# Patient Record
Sex: Male | Born: 1955 | Race: White | Hispanic: No | Smoking: Former smoker
Health system: Southern US, Community
[De-identification: ages and names within clinical notes are randomized; demographics above are authoritative.]

## PROBLEM LIST (undated history)

## (undated) DIAGNOSIS — A0472 Enterocolitis due to Clostridium difficile, not specified as recurrent: Secondary | ICD-10-CM

## (undated) DIAGNOSIS — M1990 Unspecified osteoarthritis, unspecified site: Secondary | ICD-10-CM

## (undated) DIAGNOSIS — R0602 Shortness of breath: Secondary | ICD-10-CM

## (undated) DIAGNOSIS — F1910 Other psychoactive substance abuse, uncomplicated: Secondary | ICD-10-CM

## (undated) DIAGNOSIS — C61 Malignant neoplasm of prostate: Secondary | ICD-10-CM

## (undated) DIAGNOSIS — G459 Transient cerebral ischemic attack, unspecified: Secondary | ICD-10-CM

## (undated) DIAGNOSIS — F419 Anxiety disorder, unspecified: Secondary | ICD-10-CM

## (undated) DIAGNOSIS — F329 Major depressive disorder, single episode, unspecified: Secondary | ICD-10-CM

## (undated) DIAGNOSIS — K219 Gastro-esophageal reflux disease without esophagitis: Secondary | ICD-10-CM

## (undated) DIAGNOSIS — F32A Depression, unspecified: Secondary | ICD-10-CM

## (undated) DIAGNOSIS — I10 Essential (primary) hypertension: Secondary | ICD-10-CM

## (undated) DIAGNOSIS — F1010 Alcohol abuse, uncomplicated: Secondary | ICD-10-CM

## (undated) DIAGNOSIS — I82409 Acute embolism and thrombosis of unspecified deep veins of unspecified lower extremity: Secondary | ICD-10-CM

## (undated) DIAGNOSIS — K567 Ileus, unspecified: Secondary | ICD-10-CM

## (undated) DIAGNOSIS — T7840XA Allergy, unspecified, initial encounter: Secondary | ICD-10-CM

## (undated) DIAGNOSIS — K7010 Alcoholic hepatitis without ascites: Secondary | ICD-10-CM

## (undated) HISTORY — PX: HAND SURGERY: SHX662

## (undated) HISTORY — DX: Acute embolism and thrombosis of unspecified deep veins of unspecified lower extremity: I82.409

## (undated) HISTORY — DX: Allergy, unspecified, initial encounter: T78.40XA

## (undated) HISTORY — DX: Other psychoactive substance abuse, uncomplicated: F19.10

## (undated) HISTORY — DX: Depression, unspecified: F32.A

## (undated) HISTORY — PX: HERNIA REPAIR: SHX51

## (undated) HISTORY — DX: Alcohol abuse, uncomplicated: F10.10

## (undated) HISTORY — DX: Major depressive disorder, single episode, unspecified: F32.9

---

## 2002-04-21 ENCOUNTER — Ambulatory Visit (HOSPITAL_BASED_OUTPATIENT_CLINIC_OR_DEPARTMENT_OTHER): Admission: RE | Admit: 2002-04-21 | Discharge: 2002-04-21 | Payer: Self-pay | Admitting: *Deleted

## 2002-04-21 ENCOUNTER — Encounter (INDEPENDENT_AMBULATORY_CARE_PROVIDER_SITE_OTHER): Payer: Self-pay | Admitting: *Deleted

## 2006-12-11 ENCOUNTER — Ambulatory Visit (HOSPITAL_BASED_OUTPATIENT_CLINIC_OR_DEPARTMENT_OTHER): Admission: RE | Admit: 2006-12-11 | Discharge: 2006-12-11 | Payer: Self-pay | Admitting: Orthopedic Surgery

## 2006-12-11 ENCOUNTER — Encounter (INDEPENDENT_AMBULATORY_CARE_PROVIDER_SITE_OTHER): Payer: Self-pay | Admitting: Orthopedic Surgery

## 2009-03-19 ENCOUNTER — Ambulatory Visit: Payer: Self-pay | Admitting: Gastroenterology

## 2009-03-29 ENCOUNTER — Ambulatory Visit: Payer: Self-pay | Admitting: Gastroenterology

## 2009-03-29 ENCOUNTER — Encounter: Payer: Self-pay | Admitting: Gastroenterology

## 2009-03-30 ENCOUNTER — Encounter: Payer: Self-pay | Admitting: Gastroenterology

## 2010-11-25 NOTE — Op Note (Signed)
NAMEDREVON, PLOG                            ACCOUNT NO.:  [id number]   MEDICAL RECORD NO.:  [id number]                   PATIENT TYPE:  AMB   LOCATION:  DSC                                  FACILITY:  [facility]   PHYSICIAN:  Maisie Fus B. Samuella Cota, M.D.               DATE OF BIRTH:  July 15, 1955   DATE OF PROCEDURE:  04/21/2002  DATE OF DISCHARGE:                                 OPERATIVE REPORT   CCS NUMBER:  [id number]   PREOPERATIVE DIAGNOSIS:  Left inguinal hernia.   POSTOPERATIVE DIAGNOSIS:  Left inguinal hernia.   PROCEDURE:  Left inguinal herniorrhaphy with mesh.   SURGEON:  Maisie Fus B. Samuella Cota, M.D.   ANESTHESIA:  Local (0.25% Marcaine without epinephrine, 1% Xylocaine without  epinephrine, and sodium bicarbonate), then general anesthesia by  anesthesiologist and CRNA.   DESCRIPTION OF PROCEDURE:  The patient was taken to the operating room and  placed on the table in the supine position.  The left lower quadrant of the  abdomen was prepped and draped in a sterile field.  An oblique incision was  outlined with the skin marker.  Local anesthetic was used to block the area  of the ilioinguinal nerve and the area for the incision.  The incision was  made through skin and subcutaneous tissue with subcutaneous bleeders being  clamped and ligated with 3-0 Vicryl or cauterized with the Bovie.  The  external oblique aponeurosis was divided in the line of its fibers through  and through the external ring.  About this time, the patient although he was  not having pain was having trouble lying still and was requiring large  amounts of medication, and it was elected to give him a general anesthetic  which was done.  I continued to use a local anesthetic to block the  structures beneath the external oblique aponeurosis and then used some local  later.  The cord structures were isolated with the Penrose drain.  A large  nerve was never seen.  After the cord structures had been isolated with the  Penrose drain an indirect hernia sac was found coming off the anterior  medial aspect of the cord structures.  It had a fairly wide base and was  dissected back to the internal ring.  The hernia sac was opened with the  excess hernia sac being removed, and then the peritoneum was closed with a  running suture of 0 chromic catgut with the two ends tied to each other.  Then this retracted beneath the transversalis muscle.  The patient really  had no weakness of the inguinal floor.  A piece of 3 inch x 6 inch atria  mesh was then fastened to cover the entire inguinal floor to extend around  the cord structures at the internal ring.  The mesh was attached inferiorly  with a running suture of 2-0 Novofil with the first suture being placed  in  the pubic tubercle and the other sutures in the shelving edge of Poupart's  ligament.  A slit was made in the mesh to accommodate the cord structures at  the internal ring.  The mesh was then anchored medially and superiorly with  interrupted sutures of 0 Novafil.  The mesh was then sutured to itself using  a 3-0 Vicryl suture.  There was sufficient for the cord structures at the  internal ring.  The cord structures were then returned to their normal  anatomical position, and the external oblique aponeurosis was reapproximated  with interrupted sutures of 3-0 Vicryl.  A Kelly clamp could easily be  inserted along the cord structures at the external ring.  Subcutaneous  tissue  was closed with 3-0 Vicryl and the skin was closed with a running  subcuticular suture of 4-0 Vicryl.  Benzoin and 1/2 inch Steri-Strips were  used to reinforce the skin closure.  Dry sterile dressing was applied.  The  patient seemed to tolerate the procedure well and was taken to the PACU in  satisfactory condition.                                               Thomas B. Samuella Cota, M.D.    TBP/MEDQ  D:  04/21/2002  T:  04/21/2002  Job:  [id number]   cc:   Erskine Speed, M.D.

## 2010-11-25 NOTE — Op Note (Signed)
Tanner [name], Tanner [name]                ACCOUNT NO.:  [id number]   MEDICAL RECORD NO.:  [id number]          PATIENT TYPE:  AMB   LOCATION:  DSC                          FACILITY:  [facility]   PHYSICIAN:  Tanner [name], M.D. DATE OF BIRTH:  10/29/1955   DATE OF PROCEDURE:  12/11/2006  DATE OF DISCHARGE:                               OPERATIVE REPORT   PREOPERATIVE DIAGNOSIS:  Severe Dupuytren contracture, right palm, ring  and small fingers.   POSTOPERATIVE DIAGNOSIS:  Severe Dupuytren contracture, right palm, ring  and small fingers.   OPERATION:  1. Resection of complex Dupuytren contracture from right palm and      small finger with resection of extensive nodular disease overlying      metacarpal phalangeal joints and proximal phalangeal segment of      small finger with the V-Y advancement flaps for coverage.  2. Resection of complex Dupuytren contracture, right ring finger and      palm with V-Y advancement flaps and opened palm technique.   OPERATIONS:  Tanner [name], M.D.   ASSISTANT:  Annye Rusk, PA-C   ANESTHESIA:  General by LMA.  Supervising anesthesiologist is Dr. Adonis Huguenin.   INDICATIONS:  Tanner [name] is a 56 year old man referred through  the courtesy of Dr. Elmore Guise for evaluation and management of a complex  Dupuytren contracture.   Tanner [name] has Celtic  ancestors and has a sister who has Dupuytren  contracture.  He has extensive nodular disease in the ring and small  finger rays with a progressive metacarpophalangeal flexion contracture  of the ring and small fingers and extensive PIP flexion contracture of  the small finger.   He presented for consultation and was advised to consider excision.  The  surgery, potential risks and benefits, aftercare and requirement for  postoperative therapy were explained in detail.   Questions were invited and answered.   He understands that surgery is palliative and will not cure his genetic  tendency  towards palmar fibromatosis.   He presents for surgery at this time anticipating resection of his  pathologic fascia.   Preoperatively, questions were invited and answered in the holding area  in detail.   PROCEDURE:  Tanner [name] was brought to the operating room and was  placed in the supine position on the operating table.   Following an anesthesia consult with Dr. Krista Blue, Tanner [name] declined a  regional block.   He was brought to Room 1 and placed in the supine position on the  operating table.  Under Dr. Robina Ade direct supervision, general  anesthesia by LMA induced.   The right arm was prepped with Betadine soap and solution and sterilely  draped.  A pneumatic tourniquet was applied to the proximal right  brachium.   Following examination of the right arm with an Esmarch bandage, an  arterial tourniquet on the proximal right brachium was inflated to 240  mmHg.  Procedure commenced with planning of Brunner zigzag incisions  anticipating extension to V-Y flaps over the flexion creases.  Given his  degree of nodular disease in the  palm and extensive dissection required,  open palm technique was anticipated.   Procedure commenced with sharp elevation of the skin with separation of  the pathologic fascia at the dermal layer.  After skin flaps were  elevated and retracted with 4-0 silk sutures, extensive dissection of  the nodular palmar fibromatosis was accomplished in the ring and small  fingers.   Extensive nodular disease was removed from the level the metacarpal  phalangeal joints in both the ring and small fingers.  Extensive nodular  disease was removed over the proximal phalangeal segment of the small  finger, ulnar aspect.  Fibers extending to the flexor digiti minimi and  abductor digiti minimi were resected and the natatory ligaments and  Grayson  ligaments were resected with the pathologic fascia.  The entire  palmar fascia to the ring and small fingers was  resected in the palm,  taking care to spare the common digital vessels and nerves.   After completion of the dissection, the skin flaps were lengthened by V-  Y advancement technique followed by release of the tourniquet.  There  was excellent capillary refill in all flaps.  The wound was then  repaired with corner sutures of 5-0 nylon and vertical and horizontal  mattress sutures of 5-0 nylon leaving the transverse segment of the  incision in the palm open.   The wound was then dressed with Adaptic, Silvadene, fluffed gauze,  Kerlix, sterile Webril and a volar plaster splint maintaining the wrist  in 15 degrees of extension.  There were no apparent complications.  Mr.  [name] had excellent capillary refill in his fingertips after release of  the tourniquet and viable skin margins.   For aftercare, he was provided prescriptions for Percocet 5 mg 1 p.o.  every 4 to 6 hours p.r.n. pain, 30 tablets without refill, Motrin 600 mg  1 p.o. every 6 hours with food, 30 tablets with 1 refill, and Keflex 500  mg 1 p.o. every 8 hours for 4 days as a prophylactic antibiotic.      Tanner [name], M.D.  Electronically Signed     RVS/MEDQ  D:  12/11/2006  T:  12/11/2006  Job:  [id number]   cc:   Erskine Speed, M.D.

## 2011-04-27 LAB — BASIC METABOLIC PANEL WITH GFR
BUN: 13
CO2: 32
Calcium: 10
Chloride: 99
Creatinine, Ser: 1.02
GFR calc non Af Amer: >60
Glucose, Bld: 112 — ABNORMAL HIGH
Potassium: 5.5 — ABNORMAL HIGH
Sodium: 138

## 2011-04-27 LAB — POCT HEMOGLOBIN-HEMACUE
Hemoglobin: 15.2
Operator id: 116011

## 2011-12-16 ENCOUNTER — Encounter (HOSPITAL_BASED_OUTPATIENT_CLINIC_OR_DEPARTMENT_OTHER): Payer: Self-pay | Admitting: *Deleted

## 2011-12-16 ENCOUNTER — Emergency Department (HOSPITAL_BASED_OUTPATIENT_CLINIC_OR_DEPARTMENT_OTHER)
Admission: EM | Admit: 2011-12-16 | Discharge: 2011-12-16 | Disposition: A | Discharge status: Home / Self Care | Payer: Self-pay | Attending: Emergency Medicine | Admitting: Emergency Medicine

## 2011-12-16 DIAGNOSIS — I10 Essential (primary) hypertension: Secondary | ICD-10-CM | POA: Insufficient documentation

## 2011-12-16 DIAGNOSIS — M79609 Pain in unspecified limb: Secondary | ICD-10-CM | POA: Insufficient documentation

## 2011-12-16 DIAGNOSIS — F17200 Nicotine dependence, unspecified, uncomplicated: Secondary | ICD-10-CM | POA: Insufficient documentation

## 2011-12-16 DIAGNOSIS — B353 Tinea pedis: Secondary | ICD-10-CM

## 2011-12-16 HISTORY — DX: Essential (primary) hypertension: I10

## 2011-12-16 HISTORY — DX: Alcoholic hepatitis without ascites: K70.10

## 2011-12-16 MED ORDER — SULFAMETHOXAZOLE-TRIMETHOPRIM 800-160 MG PO TABS: 1.0000 | ORAL_TABLET | Freq: Two times a day (BID) | ORAL | Status: AC

## 2011-12-16 MED ORDER — CLOTRIMAZOLE 1 % EX CREA: TOPICAL_CREAM | CUTANEOUS | Status: DC

## 2011-12-16 NOTE — ED Notes (Signed)
Pt states he was laid off in Jan. Started wearing tennis shoes all the time since. Noticed feet were breaking out in Feb. Worse now. Using creams and sprays since without relief. Saw dermatologist in Jan.

## 2011-12-16 NOTE — Discharge Instructions (Signed)
Athlete's Foot Athlete's foot (tinea pedis) is a fungal infection of the skin on the feet. It often occurs on the skin between the toes or underneath the toes. It can also occur on the soles of the feet. Athlete's foot is more likely to occur in hot, humid weather. Not washing your feet or changing your socks often enough can contribute to athlete's foot. The infection can spread from person to person (contagious). CAUSES Athlete's foot is caused by a fungus. This fungus thrives in warm, moist places. Most people get athlete's foot by sharing shower stalls, towels, and wet floors with an infected person. People with weakened immune systems, including those with diabetes, may be more likely to get athlete's foot. SYMPTOMS   Itchy areas between the toes or on the soles of the feet.   White, flaky, or scaly areas between the toes or on the soles of the feet.   Tiny, intensely itchy blisters between the toes or on the soles of the feet.   Tiny cuts on the skin. These cuts can develop a bacterial infection.   Thick or discolored toenails.  DIAGNOSIS  Your caregiver can usually tell what the problem is by doing a physical exam. Your caregiver may also take a skin sample from the rash area. The skin sample may be examined under a microscope, or it may be tested to see if fungus will grow in the sample. A sample may also be taken from your toenail for testing. TREATMENT  Over-the-counter and prescription medicines can be used to kill the fungus. These medicines are available as powders or creams. Your caregiver can suggest medicines for you. Fungal infections respond slowly to treatment. You may need to continue using your medicine for several weeks. PREVENTION   Do not share towels.   Wear sandals in wet areas, such as shared locker rooms and shared showers.   Keep your feet dry. Wear shoes that allow air to circulate. Wear cotton or wool socks.  HOME CARE INSTRUCTIONS   Take medicines as  directed by your caregiver. Do not use steroid creams on athlete's foot.   Keep your feet clean and cool. Wash your feet daily and dry them thoroughly, especially between your toes.   Change your socks every day. Wear cotton or wool socks. In hot climates, you may need to change your socks 2 to 3 times per day.   Wear sandals or canvas tennis shoes with good air circulation.   If you have blisters, soak your feet in Burow's solution or Epsom salts for 20 to 30 minutes, 2 times a day to dry out the blisters. Make sure you dry your feet thoroughly afterward.  SEEK MEDICAL CARE IF:   You have a fever.   You have swelling, soreness, warmth, or redness in your foot.   You are not getting better after 7 days of treatment.   You are not completely cured after 30 days.   You have any problems caused by your medicines.  MAKE SURE YOU:   Understand these instructions.   Will watch your condition.   Will get help right away if you are not doing well or get worse.  Document Released: 06/23/2000 Document Revised: 06/15/2011 Document Reviewed: 04/14/2011 [employer] Patient Information 2012 [employer]. 

## 2011-12-16 NOTE — ED Provider Notes (Signed)
History     CSN: [id number]  Arrival date & time 12/16/11  1357   First MD Initiated Contact with Patient 12/16/11 1447      Chief Complaint  Patient presents with  . Foot Pain    (Consider location/radiation/quality/duration/timing/severity/associated sxs/prior treatment) Patient is a 56 y.o. male presenting with lower extremity pain. The history is provided by the patient.  Foot Pain This is a new problem. The current episode started today. The problem occurs constantly. The problem has been gradually worsening. Associated symptoms include a rash. The symptoms are aggravated by nothing. He has tried nothing for the symptoms. The treatment provided moderate relief.  Pt complains of a rash to bilat feet.   Pt has a history of foot fungus  Past Medical History  Diagnosis Date  . Alcoholic hepatitis   . Hypertension     Past Surgical History  Procedure Date  . Hand surgery   . Hernia repair     History reviewed. No pertinent family history.  History  Substance Use Topics  . Smoking status: Current Everyday Smoker  . Smokeless tobacco: Not on file  . Alcohol Use: Yes      Review of Systems  Skin: Positive for rash.  All other systems reviewed and are negative.    Allergies  Procaine hcl  Home Medications   Current Outpatient Rx  Name Route Sig Dispense Refill  . CLONAZEPAM 0.5 MG PO TABS Oral Take 0.5 mg by mouth 4 (four) times daily.    Marland Kitchen METOPROLOL SUCCINATE ER 50 MG PO TB24 Oral Take 50 mg by mouth daily. Take with or immediately following a meal.    . OMEPRAZOLE PO Oral Take by mouth daily.      BP 170/106  Pulse 77  Temp(Src) 98 F (36.7 C) (Oral)  Resp 16  Ht 5' 10.5" (1.791 m)  Wt 170 lb (77.111 kg)  BMI 24.05 kg/m2  SpO2 98%  Physical Exam  Nursing note and vitals reviewed. Constitutional: He is oriented to person, place, and time. He appears well-developed and well-nourished.  Musculoskeletal: He exhibits tenderness.       Rash bilat  feet, scaling,  Looks fungal  Neurological: He is alert and oriented to person, place, and time.  Skin: Skin is warm.  Psychiatric: He has a normal mood and affect.    ED Course  Procedures (including critical care time)  Labs Reviewed - No data to display No results found.   No diagnosis found.    MDM  Pt given bactrim DS and Rx for lotrimin        Lonia Skinner [place], [place] 12/16/11 1504

## 2011-12-17 NOTE — ED Provider Notes (Signed)
Medical screening examination/treatment/procedure(s) were performed by non-physician practitioner and as supervising physician I was immediately available for consultation/collaboration.  Heavin Sebree, MD 12/17/11 1540 

## 2012-12-09 ENCOUNTER — Inpatient Hospital Stay (HOSPITAL_COMMUNITY)
Admission: EM | Admit: 2012-12-09 | Discharge: 2012-12-12 | DRG: 493 | Disposition: A | Discharge status: Home / Self Care | Payer: Medicaid Other | Attending: Orthopaedic Surgery | Admitting: Orthopaedic Surgery

## 2012-12-09 ENCOUNTER — Emergency Department (HOSPITAL_COMMUNITY): Payer: Medicaid Other

## 2012-12-09 ENCOUNTER — Encounter (HOSPITAL_COMMUNITY): Payer: Self-pay | Admitting: Emergency Medicine

## 2012-12-09 DIAGNOSIS — S82899A Other fracture of unspecified lower leg, initial encounter for closed fracture: Principal | ICD-10-CM | POA: Diagnosis present

## 2012-12-09 DIAGNOSIS — D696 Thrombocytopenia, unspecified: Secondary | ICD-10-CM

## 2012-12-09 DIAGNOSIS — I10 Essential (primary) hypertension: Secondary | ICD-10-CM | POA: Diagnosis present

## 2012-12-09 DIAGNOSIS — F1020 Alcohol dependence, uncomplicated: Secondary | ICD-10-CM | POA: Diagnosis present

## 2012-12-09 DIAGNOSIS — F17200 Nicotine dependence, unspecified, uncomplicated: Secondary | ICD-10-CM | POA: Diagnosis present

## 2012-12-09 DIAGNOSIS — Y92009 Unspecified place in unspecified non-institutional (private) residence as the place of occurrence of the external cause: Secondary | ICD-10-CM

## 2012-12-09 DIAGNOSIS — R112 Nausea with vomiting, unspecified: Secondary | ICD-10-CM | POA: Diagnosis not present

## 2012-12-09 DIAGNOSIS — W010XXA Fall on same level from slipping, tripping and stumbling without subsequent striking against object, initial encounter: Secondary | ICD-10-CM | POA: Diagnosis present

## 2012-12-09 DIAGNOSIS — D6959 Other secondary thrombocytopenia: Secondary | ICD-10-CM | POA: Diagnosis present

## 2012-12-09 DIAGNOSIS — F1010 Alcohol abuse, uncomplicated: Secondary | ICD-10-CM

## 2012-12-09 DIAGNOSIS — S82891A Other fracture of right lower leg, initial encounter for closed fracture: Secondary | ICD-10-CM

## 2012-12-09 DIAGNOSIS — E871 Hypo-osmolality and hyponatremia: Secondary | ICD-10-CM | POA: Diagnosis not present

## 2012-12-09 DIAGNOSIS — R748 Abnormal levels of other serum enzymes: Secondary | ICD-10-CM

## 2012-12-09 NOTE — ED Notes (Signed)
[id number]<AV> Expected date:<BR> Expected time:<BR> Means of arrival:<BR> Comments:<BR> EMS, ankle injury

## 2012-12-09 NOTE — ED Notes (Signed)
Brought in BY EMS from home with c/o right ankle pain after his fall tonight.  Per EMS, pt was ambulating when his right leg "gave out" and fell; pt has had immediate pain to right ankle after his fall; pt presents to ED with right ankle deformity, A/Ox4, pt is also alcohol intoxicated.

## 2012-12-10 ENCOUNTER — Encounter (HOSPITAL_COMMUNITY): Payer: Self-pay | Admitting: Registered Nurse

## 2012-12-10 ENCOUNTER — Emergency Department (HOSPITAL_COMMUNITY): Payer: Medicaid Other

## 2012-12-10 ENCOUNTER — Encounter (HOSPITAL_COMMUNITY)
Admission: EM | Disposition: A | Discharge status: Home / Self Care | Payer: Self-pay | Source: Home / Self Care | Attending: Orthopaedic Surgery

## 2012-12-10 ENCOUNTER — Inpatient Hospital Stay (HOSPITAL_COMMUNITY): Payer: Medicaid Other | Admitting: Registered Nurse

## 2012-12-10 DIAGNOSIS — S82899A Other fracture of unspecified lower leg, initial encounter for closed fracture: Principal | ICD-10-CM

## 2012-12-10 HISTORY — PX: ORIF ANKLE FRACTURE: SHX5408

## 2012-12-10 LAB — POCT I-STAT 4, (NA,K, GLUC, HGB,HCT)
Glucose, Bld: 99 mg/dL (ref 70–99)
HCT: 40 % (ref 39.0–52.0)
Potassium: 4.6 mEq/L (ref 3.5–5.1)
Sodium: 118 mEq/L — CL (ref 135–145)

## 2012-12-10 LAB — COMPREHENSIVE METABOLIC PANEL
ALT: 46 U/L (ref 0–53)
AST: 44 U/L — ABNORMAL HIGH (ref 0–37)
CO2: 24 mEq/L (ref 19–32)
Calcium: 8.5 mg/dL (ref 8.4–10.5)
Creatinine, Ser: 1.04 mg/dL (ref 0.50–1.35)
GFR calc Af Amer: >90 mL/min (ref >90)
GFR calc non Af Amer: 80 mL/min — ABNORMAL LOW (ref >90)
Sodium: 112 mEq/L — CL (ref 135–145)
Total Protein: 7 g/dL (ref 6.0–8.3)

## 2012-12-10 LAB — CBC WITH DIFFERENTIAL/PLATELET
Eosinophils Absolute: 0.2 10*3/uL (ref 0.0–0.7)
Eosinophils Relative: 3 % (ref 0–5)
Lymphs Abs: 0.6 10*3/uL — ABNORMAL LOW (ref 0.7–4.0)
MCH: 35.1 pg — ABNORMAL HIGH (ref 26.0–34.0)
MCHC: 37.3 g/dL — ABNORMAL HIGH (ref 30.0–36.0)
MCV: 94.3 fL (ref 78.0–100.0)
Monocytes Absolute: 0.5 10*3/uL (ref 0.1–1.0)
Neutrophils Relative %: 78 % — ABNORMAL HIGH (ref 43–77)
Platelets: 138 10*3/uL — ABNORMAL LOW (ref 150–400)
RDW: 11.9 % (ref 11.5–15.5)

## 2012-12-10 LAB — OSMOLALITY, URINE: Osmolality, Ur: 322 mOsm/kg — ABNORMAL LOW (ref 390–1090)

## 2012-12-10 LAB — SODIUM, URINE, RANDOM: Sodium, Ur: 18 mEq/L

## 2012-12-10 SURGERY — OPEN REDUCTION INTERNAL FIXATION (ORIF) ANKLE FRACTURE
Anesthesia: General | Site: Ankle | Laterality: Right | Wound class: Clean

## 2012-12-10 MED ORDER — VITAMIN B-1 100 MG PO TABS
100.0000 mg | ORAL_TABLET | Freq: Every day | ORAL | Status: DC
  Administered 2012-12-10 – 2012-12-12 (×3): 100 mg via ORAL
  Filled 2012-12-10 (×3): qty 1

## 2012-12-10 MED ORDER — PROMETHAZINE HCL 25 MG RE SUPP: 12.5000 mg | Freq: Four times a day (QID) | RECTAL | Status: DC | PRN

## 2012-12-10 MED ORDER — LORAZEPAM 2 MG/ML IJ SOLN: 1.0000 mg | Freq: Four times a day (QID) | INTRAMUSCULAR | Status: DC | PRN

## 2012-12-10 MED ORDER — METHOCARBAMOL 100 MG/ML IJ SOLN
500.0000 mg | Freq: Four times a day (QID) | INTRAVENOUS | Status: DC | PRN
  Filled 2012-12-10: qty 5

## 2012-12-10 MED ORDER — OXYCODONE-ACETAMINOPHEN 5-325 MG PO TABS: 1.0000 | ORAL_TABLET | ORAL | Status: DC | PRN

## 2012-12-10 MED ORDER — CLONAZEPAM 0.5 MG PO TABS
0.5000 mg | ORAL_TABLET | Freq: Two times a day (BID) | ORAL | Status: DC | PRN
  Filled 2012-12-10: qty 1

## 2012-12-10 MED ORDER — METOPROLOL TARTRATE 50 MG PO TABS
50.0000 mg | ORAL_TABLET | Freq: Two times a day (BID) | ORAL | Status: DC
  Administered 2012-12-11 – 2012-12-12 (×3): 50 mg via ORAL
  Filled 2012-12-10 (×7): qty 1

## 2012-12-10 MED ORDER — HYDROMORPHONE HCL PF 1 MG/ML IJ SOLN
1.0000 mg | INTRAMUSCULAR | Status: DC | PRN
  Administered 2012-12-10: 1 mg via INTRAVENOUS
  Filled 2012-12-10: qty 1

## 2012-12-10 MED ORDER — EPHEDRINE SULFATE 50 MG/ML IJ SOLN
INTRAMUSCULAR | Status: DC | PRN
  Administered 2012-12-10: 5 mg via INTRAVENOUS

## 2012-12-10 MED ORDER — SODIUM CHLORIDE 0.9 % IR SOLN
Status: DC | PRN
  Administered 2012-12-10: 16:00:00

## 2012-12-10 MED ORDER — SUCCINYLCHOLINE CHLORIDE 20 MG/ML IJ SOLN
INTRAMUSCULAR | Status: DC | PRN
  Administered 2012-12-10: 100 mg via INTRAVENOUS

## 2012-12-10 MED ORDER — MIDAZOLAM HCL 5 MG/5ML IJ SOLN
INTRAMUSCULAR | Status: DC | PRN
  Administered 2012-12-10: 2 mg via INTRAVENOUS

## 2012-12-10 MED ORDER — ONDANSETRON HCL 4 MG PO TABS: 4.0000 mg | ORAL_TABLET | Freq: Four times a day (QID) | ORAL | Status: DC | PRN

## 2012-12-10 MED ORDER — METOCLOPRAMIDE HCL 5 MG/ML IJ SOLN: 5.0000 mg | Freq: Three times a day (TID) | INTRAMUSCULAR | Status: DC | PRN

## 2012-12-10 MED ORDER — SODIUM CHLORIDE 0.9 % IV SOLN
INTRAVENOUS | Status: DC | PRN
  Administered 2012-12-10: 16:00:00 via INTRAVENOUS

## 2012-12-10 MED ORDER — KETAMINE HCL 50 MG/ML IJ SOLN
INTRAMUSCULAR | Status: DC | PRN
  Administered 2012-12-10 (×2): 10 mg via INTRAMUSCULAR
  Administered 2012-12-10: 5 mg via INTRAMUSCULAR

## 2012-12-10 MED ORDER — SODIUM CHLORIDE 0.9 % IV SOLN
INTRAVENOUS | Status: DC
  Administered 2012-12-10 – 2012-12-11 (×2): via INTRAVENOUS

## 2012-12-10 MED ORDER — ADULT MULTIVITAMIN W/MINERALS CH
1.0000 | ORAL_TABLET | Freq: Every day | ORAL | Status: DC
  Administered 2012-12-10 – 2012-12-12 (×3): 1 via ORAL
  Filled 2012-12-10 (×3): qty 1

## 2012-12-10 MED ORDER — CEFAZOLIN SODIUM-DEXTROSE 2-3 GM-% IV SOLR
2.0000 g | Freq: Four times a day (QID) | INTRAVENOUS | Status: AC
  Administered 2012-12-10 – 2012-12-11 (×2): 2 g via INTRAVENOUS
  Filled 2012-12-10 (×2): qty 50

## 2012-12-10 MED ORDER — LISINOPRIL 40 MG PO TABS
40.0000 mg | ORAL_TABLET | Freq: Every day | ORAL | Status: DC
  Administered 2012-12-11 – 2012-12-12 (×3): 40 mg via ORAL
  Filled 2012-12-10 (×3): qty 1

## 2012-12-10 MED ORDER — ONDANSETRON HCL 4 MG/2ML IJ SOLN: 4.0000 mg | Freq: Four times a day (QID) | INTRAMUSCULAR | Status: DC | PRN

## 2012-12-10 MED ORDER — LACTATED RINGERS IV SOLN
INTRAVENOUS | Status: DC | PRN
  Administered 2012-12-10: 14:00:00 via INTRAVENOUS

## 2012-12-10 MED ORDER — ONDANSETRON HCL 8 MG PO TABS
8.0000 mg | ORAL_TABLET | Freq: Once | ORAL | Status: AC
  Administered 2012-12-10: 8 mg via ORAL
  Filled 2012-12-10: qty 1

## 2012-12-10 MED ORDER — CLONAZEPAM 0.5 MG PO TABS
0.5000 mg | ORAL_TABLET | Freq: Four times a day (QID) | ORAL | Status: DC
  Administered 2012-12-10 – 2012-12-12 (×8): 0.5 mg via ORAL
  Filled 2012-12-10 (×7): qty 1

## 2012-12-10 MED ORDER — PROPOFOL 10 MG/ML IV BOLUS
INTRAVENOUS | Status: DC | PRN
  Administered 2012-12-10: 150 mg via INTRAVENOUS

## 2012-12-10 MED ORDER — ASPIRIN EC 325 MG PO TBEC: 325.0000 mg | DELAYED_RELEASE_TABLET | Freq: Two times a day (BID) | ORAL | Status: DC

## 2012-12-10 MED ORDER — FENTANYL CITRATE 0.05 MG/ML IJ SOLN
INTRAMUSCULAR | Status: DC | PRN
  Administered 2012-12-10: 50 ug via INTRAVENOUS
  Administered 2012-12-10 (×2): 100 ug via INTRAVENOUS

## 2012-12-10 MED ORDER — LIDOCAINE HCL (CARDIAC) 20 MG/ML IV SOLN
INTRAVENOUS | Status: DC | PRN
  Administered 2012-12-10: 50 mg via INTRAVENOUS

## 2012-12-10 MED ORDER — METHOCARBAMOL 500 MG PO TABS
500.0000 mg | ORAL_TABLET | Freq: Four times a day (QID) | ORAL | Status: DC | PRN
  Administered 2012-12-11 – 2012-12-12 (×4): 500 mg via ORAL
  Filled 2012-12-10 (×4): qty 1

## 2012-12-10 MED ORDER — PANTOPRAZOLE SODIUM 40 MG PO TBEC
40.0000 mg | DELAYED_RELEASE_TABLET | Freq: Every day | ORAL | Status: DC
  Administered 2012-12-11 – 2012-12-12 (×2): 40 mg via ORAL
  Filled 2012-12-10 (×2): qty 1

## 2012-12-10 MED ORDER — METHOCARBAMOL 100 MG/ML IJ SOLN
500.0000 mg | Freq: Once | INTRAVENOUS | Status: AC
  Administered 2012-12-10: 500 mg via INTRAVENOUS
  Filled 2012-12-10: qty 5

## 2012-12-10 MED ORDER — OXYCODONE-ACETAMINOPHEN 5-325 MG PO TABS
1.0000 | ORAL_TABLET | ORAL | Status: DC | PRN
  Administered 2012-12-10 – 2012-12-12 (×10): 2 via ORAL
  Filled 2012-12-10 (×10): qty 2

## 2012-12-10 MED ORDER — THIAMINE HCL 100 MG/ML IJ SOLN
100.0000 mg | Freq: Every day | INTRAMUSCULAR | Status: DC
  Filled 2012-12-10 (×3): qty 1

## 2012-12-10 MED ORDER — SODIUM CHLORIDE 0.9 % IV SOLN: INTRAVENOUS | Status: DC

## 2012-12-10 MED ORDER — SODIUM CHLORIDE 0.9 % IV SOLN
INTRAVENOUS | Status: AC
  Administered 2012-12-10: 01:00:00 via INTRAVENOUS

## 2012-12-10 MED ORDER — HYDROMORPHONE HCL PF 1 MG/ML IJ SOLN: 0.2500 mg | INTRAMUSCULAR | Status: DC | PRN

## 2012-12-10 MED ORDER — PROMETHAZINE HCL 25 MG PO TABS: 12.5000 mg | ORAL_TABLET | Freq: Four times a day (QID) | ORAL | Status: DC | PRN

## 2012-12-10 MED ORDER — LORAZEPAM 1 MG PO TABS: 1.0000 mg | ORAL_TABLET | Freq: Four times a day (QID) | ORAL | Status: DC | PRN

## 2012-12-10 MED ORDER — PROMETHAZINE HCL 25 MG/ML IJ SOLN
12.5000 mg | Freq: Four times a day (QID) | INTRAMUSCULAR | Status: DC | PRN
  Administered 2012-12-10: 12.5 mg via INTRAVENOUS
  Filled 2012-12-10: qty 1

## 2012-12-10 MED ORDER — ONDANSETRON HCL 4 MG/2ML IJ SOLN
INTRAMUSCULAR | Status: DC | PRN
  Administered 2012-12-10: 4 mg via INTRAVENOUS

## 2012-12-10 MED ORDER — MORPHINE SULFATE 2 MG/ML IJ SOLN
1.0000 mg | INTRAMUSCULAR | Status: DC | PRN
  Administered 2012-12-10: 2 mg via INTRAVENOUS
  Filled 2012-12-10: qty 1

## 2012-12-10 MED ORDER — METOCLOPRAMIDE HCL 10 MG PO TABS: 5.0000 mg | ORAL_TABLET | Freq: Three times a day (TID) | ORAL | Status: DC | PRN

## 2012-12-10 MED ORDER — ASPIRIN EC 325 MG PO TBEC
325.0000 mg | DELAYED_RELEASE_TABLET | Freq: Two times a day (BID) | ORAL | Status: DC
  Administered 2012-12-10 – 2012-12-12 (×4): 325 mg via ORAL
  Filled 2012-12-10 (×6): qty 1

## 2012-12-10 MED ORDER — SODIUM CHLORIDE 0.9 % IV SOLN
Freq: Once | INTRAVENOUS | Status: AC
  Administered 2012-12-10: 02:00:00 via INTRAVENOUS

## 2012-12-10 MED ORDER — FOLIC ACID 1 MG PO TABS
1.0000 mg | ORAL_TABLET | Freq: Every day | ORAL | Status: DC
  Administered 2012-12-10 – 2012-12-12 (×3): 1 mg via ORAL
  Filled 2012-12-10 (×3): qty 1

## 2012-12-10 MED ORDER — SODIUM CHLORIDE 0.9 % IV SOLN
Freq: Once | INTRAVENOUS | Status: AC
  Administered 2012-12-10: 05:00:00 via INTRAVENOUS

## 2012-12-10 MED ORDER — CEFAZOLIN SODIUM-DEXTROSE 2-3 GM-% IV SOLR
INTRAVENOUS | Status: DC | PRN
  Administered 2012-12-10: 2 g via INTRAVENOUS

## 2012-12-10 MED ORDER — PROMETHAZINE HCL 25 MG/ML IJ SOLN: 6.2500 mg | INTRAMUSCULAR | Status: DC | PRN

## 2012-12-10 MED ORDER — METOPROLOL SUCCINATE ER 50 MG PO TB24
50.0000 mg | ORAL_TABLET | Freq: Every day | ORAL | Status: DC
  Administered 2012-12-10: 50 mg via ORAL
  Filled 2012-12-10: qty 1

## 2012-12-10 SURGICAL SUPPLY — 48 items
BAG ZIPLOCK 12X15 (MISCELLANEOUS) ×2 IMPLANT
BANDAGE ELASTIC 6 VELCRO ST LF (GAUZE/BANDAGES/DRESSINGS) ×2 IMPLANT
BANDAGE GAUZE ELAST BULKY 4 IN (GAUZE/BANDAGES/DRESSINGS) ×4 IMPLANT
BIT DRILL 2.5X2.75 QC CALB (BIT) ×2 IMPLANT
BIT DRILL 2.9 CANN QC NONSTRL (BIT) ×2 IMPLANT
CANISTER SUCTION 2500CC (MISCELLANEOUS) ×2 IMPLANT
CLOTH BEACON ORANGE TIMEOUT ST (SAFETY) ×2 IMPLANT
COVER SURGICAL LIGHT HANDLE (MISCELLANEOUS) ×2 IMPLANT
CUFF TOURN SGL QUICK 34 (TOURNIQUET CUFF) ×1
CUFF TRNQT CYL 34X4X40X1 (TOURNIQUET CUFF) ×1 IMPLANT
DRAPE C-ARM 42X120 X-RAY (DRAPES) ×2 IMPLANT
DRAPE U-SHAPE 47X51 STRL (DRAPES) ×2 IMPLANT
DRSG ADAPTIC 3X8 NADH LF (GAUZE/BANDAGES/DRESSINGS) IMPLANT
DRSG PAD ABDOMINAL 8X10 ST (GAUZE/BANDAGES/DRESSINGS) IMPLANT
ELECT REM PT RETURN 9FT ADLT (ELECTROSURGICAL) ×2
ELECTRODE REM PT RTRN 9FT ADLT (ELECTROSURGICAL) ×1 IMPLANT
GLOVE BIO SURGEON STRL SZ8.5 (GLOVE) ×2 IMPLANT
K-WIRE ACE 1.6X6 (WIRE) ×4
KWIRE ACE 1.6X6 (WIRE) ×2 IMPLANT
PACK LOWER EXTREMITY WL (CUSTOM PROCEDURE TRAY) ×2 IMPLANT
PAD CAST 4YDX4 CTTN HI CHSV (CAST SUPPLIES) IMPLANT
PADDING CAST ABS 6INX4YD NS (CAST SUPPLIES) ×1
PADDING CAST ABS COTTON 6X4 NS (CAST SUPPLIES) ×1 IMPLANT
PADDING CAST COTTON 4X4 STRL (CAST SUPPLIES)
PADDING CAST COTTON 6X4 STRL (CAST SUPPLIES) ×4 IMPLANT
PLATE ACE 100DEG 8HOLE (Plate) ×2 IMPLANT
POSITIONER SURGICAL ARM (MISCELLANEOUS) ×2 IMPLANT
SCREW ACE CAN 4.0 40M (Screw) ×2 IMPLANT
SCREW ACE CAN 4.0 44M (Screw) ×2 IMPLANT
SCREW ACE CAN 4.0 46M (Screw) ×2 IMPLANT
SCREW CORTICAL 3.5MM  12MM (Screw) ×3 IMPLANT
SCREW CORTICAL 3.5MM  16MM (Screw) ×1 IMPLANT
SCREW CORTICAL 3.5MM 12MM (Screw) ×3 IMPLANT
SCREW CORTICAL 3.5MM 14MM (Screw) ×6 IMPLANT
SCREW CORTICAL 3.5MM 16MM (Screw) ×1 IMPLANT
SCREW CORTICAL 3.5MM 18MM (Screw) ×2 IMPLANT
SPLINT PLASTER CAST XFAST 5X30 (CAST SUPPLIES) ×1 IMPLANT
SPLINT PLASTER XFAST SET 5X30 (CAST SUPPLIES) ×1
SPONGE GAUZE 4X4 12PLY (GAUZE/BANDAGES/DRESSINGS) IMPLANT
STAPLER VISISTAT 35W (STAPLE) ×2 IMPLANT
STRIP CLOSURE SKIN 1/2X4 (GAUZE/BANDAGES/DRESSINGS) IMPLANT
SUT MNCRL AB 4-0 PS2 18 (SUTURE) ×2 IMPLANT
SUT VIC AB 0 CT1 27 (SUTURE) ×1
SUT VIC AB 0 CT1 27XBRD ANTBC (SUTURE) ×1 IMPLANT
SUT VIC AB 1 CT1 27 (SUTURE) ×2
SUT VIC AB 1 CT1 27XBRD ANTBC (SUTURE) ×2 IMPLANT
SUT VIC AB 2-0 CT1 27 (SUTURE) ×1
SUT VIC AB 2-0 CT1 TAPERPNT 27 (SUTURE) ×1 IMPLANT

## 2012-12-10 NOTE — H&P (Signed)
Tanner [name] is an 58 y.o. male.   Chief Complaint: Right ankle pain HPI: Tanner [name] suffered Tanner fall last evening injuring his right ankle. he was unable to bear weight and was transported to the emergency room. He was complaining of pain and swelling in the right ankle. X-rays did reveal Tanner displaced ankle fracture. He was admitted to the floor after splinting for pain control and ORIF to be done today.  Past Medical History  Diagnosis Date  . Alcoholic hepatitis   . Hypertension     Past Surgical History  Procedure Laterality Date  . Hand surgery    . Hernia repair      History reviewed. No pertinent family history. Social History:  reports that he has been smoking.  He does not have any smokeless tobacco history on file. He reports that  drinks alcohol. He reports that he does not use illicit drugs.  Allergies:  Allergies  Allergen Reactions  . Procaine Hcl     Medications Prior to Admission  Medication Sig Dispense Refill  . clonazePAM (KLONOPIN) 0.5 MG tablet Take 0.5 mg by mouth 4 (four) times daily.      Marland Kitchen doxazosin (CARDURA) 8 MG tablet Take 8 mg by mouth 2 (two) times daily.      . furosemide (LASIX) 40 MG tablet Take 20 mg by mouth daily.      Marland Kitchen lisinopril (PRINIVIL,ZESTRIL) 40 MG tablet Take 40 mg by mouth daily.      . metoprolol (LOPRESSOR) 50 MG tablet Take 50 mg by mouth 2 (two) times daily.      Marland Kitchen omeprazole (PRILOSEC) 40 MG capsule Take 40 mg by mouth daily.        Results for orders placed during the hospital encounter of 12/09/12 (from the past 48 hour(s))  CBC WITH DIFFERENTIAL     Status: Abnormal   Collection Time    12/10/12  1:00 AM      Result Value Range   WBC 5.8  4.0 - 10.5 K/uL   RBC 3.67 (*) 4.22 - 5.81 MIL/uL   Hemoglobin 12.9 (*) 13.0 - 17.0 g/dL   HCT 16.1 (*) 09.6 - 04.5 %   MCV 94.3  78.0 - 100.0 fL   MCH 35.1 (*) 26.0 - 34.0 pg   MCHC 37.3 (*) 30.0 - 36.0 g/dL   Comment: RULED OUT INTERFERING SUBSTANCES   RDW 11.9  11.5 - 15.5 %    Platelets 138 (*) 150 - 400 K/uL   Neutrophils Relative % 78 (*) 43 - 77 %   Lymphocytes Relative 11 (*) 12 - 46 %   Monocytes Relative 8  3 - 12 %   Eosinophils Relative 3  0 - 5 %   Basophils Relative 0  0 - 1 %   Neutro Abs 4.5  1.7 - 7.7 K/uL   Lymphs Abs 0.6 (*) 0.7 - 4.0 K/uL   Monocytes Absolute 0.5  0.1 - 1.0 K/uL   Eosinophils Absolute 0.2  0.0 - 0.7 K/uL   Basophils Absolute 0.0  0.0 - 0.1 K/uL   Smear Review MORPHOLOGY UNREMARKABLE    COMPREHENSIVE METABOLIC PANEL     Status: Abnormal   Collection Time    12/10/12  1:00 AM      Result Value Range   Sodium 112 (*) 135 - 145 mEq/L   Comment: CRITICAL RESULT CALLED TO, READ BACK BY AND VERIFIED WITH:     Tanner Nan RN 12/10/12 Tanner [name]  Tanner Nan RN AT 0200 12/10/12 Tanner [name]   Potassium 4.1  3.5 - 5.1 mEq/L   Chloride 76 (*) 96 - 112 mEq/L   CO2 24  19 - 32 mEq/L   Glucose, Bld 95  70 - 99 mg/dL   BUN 9  6 - 23 mg/dL   Creatinine, Ser 1.61  0.50 - 1.35 mg/dL   Calcium 8.5  8.4 - 09.6 mg/dL   Total Protein 7.0  6.0 - 8.3 g/dL   Albumin 3.5  3.5 - 5.2 g/dL   AST 44 (*) 0 - 37 U/L   ALT 46  0 - 53 U/L   Alkaline Phosphatase 61  39 - 117 U/L   Total Bilirubin 0.3  0.3 - 1.2 mg/dL   GFR calc non Af Amer 80 (*) >90 mL/min   GFR calc Af Amer >90  >90 mL/min   Comment:            The eGFR has been calculated     using the CKD EPI equation.     This calculation has not been     validated in all clinical     situations.     eGFR's persistently     <90 mL/min signify     possible Chronic Kidney Disease.  Tanner [name]     Status: Abnormal   Collection Time    12/10/12  1:00 AM      Result Value Range   Alcohol, Ethyl (B) 85 (*) 0 - 11 mg/dL   Comment:            LOWEST DETECTABLE LIMIT FOR     SERUM ALCOHOL IS 11 mg/dL     FOR MEDICAL PURPOSES ONLY  MRSA PCR SCREENING     Status: None   Collection Time    12/10/12  5:36 AM      Result Value Range   MRSA by PCR NEGATIVE  NEGATIVE   Comment:            The  GeneXpert MRSA Assay (FDA     approved for NASAL specimens     only), is one component of Tanner     comprehensive MRSA colonization     surveillance program. It is not     intended to diagnose MRSA     infection nor to guide or     monitor treatment for     MRSA infections.   Dg Tibia/fibula Right  12/10/2012   *RADIOLOGY REPORT*  Clinical Data: Fall, twisting ankle injury and deformity  RIGHT TIBIA AND FIBULA - 2 VIEW  Comparison: Ankle radiographs same date  Findings: Oblique distal right fibular fracture incompletely visualized.  Proximal tibia and fibula are unremarkable.  No radiopaque foreign body.  IMPRESSION: Incompletely visualized oblique distal right fibular fracture.   Original Report Authenticated By: Tanner [name], M.D.   Dg Ankle Complete Right  12/10/2012   *RADIOLOGY REPORT*  Clinical Data: Fall, twisting ankle injury  RIGHT ANKLE - COMPLETE 3+ VIEW  Comparison: Tibia/fibula films same date  Findings: Mildly comminuted distal right fibular metadiaphyseal fracture noted with apex lateral angulation of the fracture fragments and overlying soft tissue swelling.  There is also tibiotalar dislocation and comminuted posterior malleolar fracture. Overlying soft tissue swelling is evident.  Well corticated osseous fragment adjacent to the medial malleolus may represent avulsion fracture of unknown chronicity.  IMPRESSION: Comminuted distal right fibular fracture and comminuted posterior malleolar fracture with tibiotalar dislocation.  These results were called by telephone  on 12/10/2012 at 12:05 Tanner.m. to Dr. Effie [name], who verbally acknowledged these results.   Original Report Authenticated By: Tanner [name], M.D.   Dg Chest Portable 1 View  12/10/2012   *RADIOLOGY REPORT*  Clinical Data: Ankle fracture, preoperative exam  PORTABLE CHEST - 1 VIEW  Comparison: None.  Findings: Mild S-shaped curvature of the thoracic spine.  Heart size upper limits of normal. Diffusely increased interstitial lung  markings noted, without focal pulmonary opacity.  No pleural effusion.  No acute osseous finding.  IMPRESSION: No acute cardiopulmonary process.   Original Report Authenticated By: Tanner [name], M.D.    Review of Systems  Constitutional: Negative.   HENT: Negative.   Eyes: Negative.   Respiratory: Negative.   Cardiovascular: Negative.   Gastrointestinal: Negative.   Genitourinary: Negative.   Musculoskeletal: Positive for joint pain.  Skin: Negative.   Neurological: Negative.   Endo/Heme/Allergies: Negative.   Psychiatric/Behavioral: Negative.     Blood pressure 159/104, pulse 79, temperature 97.8 F (36.6 C), temperature source Oral, resp. rate 16, height 5\' 10"  (1.778 m), weight 83.915 kg (185 lb), SpO2 95.00%. Physical Exam  Constitutional: He appears well-nourished.  HENT:  Head: Atraumatic.  Eyes: Conjunctivae are normal.  Neck: Normal range of motion.  Cardiovascular: Regular rhythm.   Respiratory: Breath sounds normal.  GI: Bowel sounds are normal.  Musculoskeletal:  Right ankle is in Tanner posterior splint. There is mild swelling. Good neurovascular status. Pain with palpation or any attempts at motion.  Neurological: He is alert.  Skin: Skin is warm.  Psychiatric: He has Tanner normal mood and affect.     Assessment/Plan Assessment: Right ankle displaced fracture. 2. Hyponatremia Plan I have discussed options with Molly Maduro and we will proceed when cleared by medicine with an ankle ORIF on the right side. The operation will restore function and reduce his pain. He will need to be nonweightbearing for at least 4 weeks postoperatively. We have reviewed the risks of anesthesia, infection, DVT and nonunion associated with this operation. The [facility] hospitalists were consulted for his medical status.  Esme Freund R 12/10/2012, 11:31 AM

## 2012-12-10 NOTE — Progress Notes (Signed)
UR completed 

## 2012-12-10 NOTE — Brief Op Note (Signed)
Dossie Swor [id number] 12/10/2012   PRE-OP DIAGNOSIS: right ankle fracture  POST-OP DIAGNOSIS: same  PROCEDURE: right ankle ORIF  ANESTHESIA: general  Javae Braaten G   Dictation #:  [id number]

## 2012-12-10 NOTE — Progress Notes (Signed)
Patient nauseated with vomiting. Patient contribute n/v to dose of Dilaudid he received in  ED for pain. Jason Coop PA return page with new orders obtained for nausea & pain med. Sodium level 112. New order obtained for NS @ 50cc/hr.

## 2012-12-10 NOTE — Brief Op Note (Signed)
12/09/2012 - 12/10/2012  4:53 PM  PATIENT:  Tanner [name]  57 y.o. male  PRE-OPERATIVE DIAGNOSIS:  right ankle fracture  POST-OPERATIVE DIAGNOSIS:  right ankle fracture  PROCEDURE:  Procedure(s): OPEN REDUCTION INTERNAL FIXATION (ORIF) ANKLE FRACTURE (Right)  SURGEON:  Surgeon(s) and Role:    * Velna Ochs, MD - Primary  PHYSICIAN ASSISTANT:   ASSISTANTS: Daveena Elmore pa   ANESTHESIA:     EBL:  Total I/O In: 1100 [I.V.:1100] Out: 200 [Urine:200]  BLOOD ADMINISTERED:none  DRAINS: none   LOCAL MEDICATIONS USED:  MARCAINE     SPECIMEN:  No Specimen  DISPOSITION OF SPECIMEN:  N/A  COUNTS:  YES  TOURNIQUET:   Total Tourniquet Time Documented: Thigh (Left) - 58 minutes Total: Thigh (Left) - 58 minutes   DICTATION: .  PLAN OF CARE:   PATIENT DISPOSITION:     Delay start of Pharmacological VTE agent (>24hrs) due to surgical blood loss or risk of bleeding: no

## 2012-12-10 NOTE — Consult Note (Signed)
[facility] Medical Consultation  Tanner [name] ZOX:[id number] DOB: 05/03/1956 DOA: 12/09/2012 PCP: No PCP Per Patient   Requesting physician: [facility] Date of consultation: 12/10/2012 Reason for consultation: hyponatremia   Impression/Recommendations Active Problems: Comminuted distal right fibular fracture and comminuted posterior malleolar fracture with tibiotalar dislocation - pt scheduled for OR tomorrow - per ortho, pt needs clearance as sodium is 112 Hyponatremia - unclear etiology at this time, possibly pre renal - I will check urine sodium, repeat BMP - will provide gentle hydration with NS and repeat BMP Alcoholic hepatitis - mild elevation of AST in the pattern of alcohol use - will need consultation on cessation once medical more stable Thrombocytopenia - likely from alcohol induced bone marrow damage - repeat CBC In AM  I will followup again tomorrow. Please contact me if I can be of assistance in the meanwhile. Thank you for this consultation.  Chief Complaint: fracture of right fibula, hyponatremia in patient   HPI:  Pt is 57 yo male who presented to [facility] after a fall and with subsequent comminuted distal right fibular fracture and comminuted posterior malleolar fracture with tibiotalar dislocation, admitted under ortho group and [facility] consulted for hyponatremia. Pt is sleeping at the time of my interview and is not providing any information, told me to come back later. Per record review, no chest pain or shortness of breath, no abdominal or urinary concerns.  Review of Systems:  Unable to obtain, pt is sleeping and explains he is very tired   Past Medical History  Diagnosis Date  . Alcoholic hepatitis   . Hypertension    Past Surgical History  Procedure Laterality Date  . Hand surgery    . Hernia repair     Social History:  reports that he has been smoking.  He does not have any smokeless tobacco history on file. He reports that  drinks alcohol. He  reports that he does not use illicit drugs.  Allergies  Allergen Reactions  . Procaine Hcl    History reviewed. No pertinent family history.  Prior to Admission medications   Medication Sig Start Date End Date Taking? Authorizing Provider  clonazePAM (KLONOPIN) 0.5 MG tablet Take 0.5 mg by mouth 4 (four) times daily.   Yes Historical Provider, MD  metoprolol succinate (TOPROL-XL) 50 MG 24 hr tablet Take 50 mg by mouth daily. Take with or immediately following a meal.   Yes Historical Provider, MD  omeprazole (PRILOSEC) 20 MG capsule Take 20 mg by mouth daily.   Yes Historical Provider, MD   Physical Exam: Blood pressure 150/99, pulse 75, temperature 98.1 F (36.7 C), temperature source Oral, resp. rate 20, height 5\' 10"  (1.778 m), weight 83.915 kg (185 lb), SpO2 94.00%. Filed Vitals:   12/09/12 2319 12/10/12 0137 12/10/12 0221 12/10/12 0224  BP: 126/81 140/85 150/99   Pulse: 67 77 75   Temp: 97.5 F (36.4 C) 97.8 F (36.6 C) 98.1 F (36.7 C)   TempSrc: Oral  Oral   Resp: 18 18 20    Height:    5\' 10"  (1.778 m)  Weight:    83.915 kg (185 lb)  SpO2: 97% 96% 94%     Physical Exam  Constitutional: Appears well-developed and well-nourished. No distress.  HENT: Normocephalic. External right and left ear normal. Oropharynx is clear and moist.  Eyes: Conjunctivae and EOM are normal. PERRLA, no scleral icterus.  Neck: Normal ROM. Neck supple. No JVD. No tracheal deviation. No thyromegaly.  CVS: RRR, S1/S2 +, no murmurs, no  gallops, no carotid bruit.  Pulmonary: Effort and breath sounds normal, no stridor, rhonchi, wheezes, rales.  Abdominal: Soft. BS +,  no distension, tenderness, rebound or guarding.  Musculoskeletal: Normal range of motion. No edema and no tenderness.  Lymphadenopathy: No lymphadenopathy noted, cervical, inguinal. Neuro: Alert. Normal reflexes, muscle tone coordination. No cranial nerve deficit. Skin: Skin is warm and dry. No rash noted. Not diaphoretic. No  erythema. No pallor.  Psychiatric: Normal mood and affect. Behavior, judgment, thought content normal.   Labs on Admission:  Basic Metabolic Panel:  Recent Labs Lab 12/10/12 0100  NA 112*  K 4.1  CL 76*  CO2 24  GLUCOSE 95  BUN 9  CREATININE 1.04  CALCIUM 8.5   Liver Function Tests:  Recent Labs Lab 12/10/12 0100  AST 44*  ALT 46  ALKPHOS 61  BILITOT 0.3  PROT 7.0  ALBUMIN 3.5   CBC:  Recent Labs Lab 12/10/12 0100  WBC 5.8  NEUTROABS 4.5  HGB 12.9*  HCT 34.6*  MCV 94.3  PLT 138*    Radiological Exams on Admission: Dg Tibia/fibula Right  12/10/2012   *RADIOLOGY REPORT*  Clinical Data: Fall, twisting ankle injury and deformity  RIGHT TIBIA AND FIBULA - 2 VIEW  Comparison: Ankle radiographs same date  Findings: Oblique distal right fibular fracture incompletely visualized.  Proximal tibia and fibula are unremarkable.  No radiopaque foreign body.  IMPRESSION: Incompletely visualized oblique distal right fibular fracture.   Original Report Authenticated By: Christiana Pellant, M.D.   Dg Ankle Complete Right  12/10/2012   *RADIOLOGY REPORT*  Clinical Data: Fall, twisting ankle injury  RIGHT ANKLE - COMPLETE 3+ VIEW  Comparison: Tibia/fibula films same date  Findings: Mildly comminuted distal right fibular metadiaphyseal fracture noted with apex lateral angulation of the fracture fragments and overlying soft tissue swelling.  There is also tibiotalar dislocation and comminuted posterior malleolar fracture. Overlying soft tissue swelling is evident.  Well corticated osseous fragment adjacent to the medial malleolus may represent avulsion fracture of unknown chronicity.  IMPRESSION: Comminuted distal right fibular fracture and comminuted posterior malleolar fracture with tibiotalar dislocation.  These results were called by telephone on 12/10/2012 at 12:05 a.m. to Dr. Effie Shy, who verbally acknowledged these results.   Original Report Authenticated By: Christiana Pellant, M.D.   Dg Chest  Portable 1 View  12/10/2012   *RADIOLOGY REPORT*  Clinical Data: Ankle fracture, preoperative exam  PORTABLE CHEST - 1 VIEW  Comparison: None.  Findings: Mild S-shaped curvature of the thoracic spine.  Heart size upper limits of normal. Diffusely increased interstitial lung markings noted, without focal pulmonary opacity.  No pleural effusion.  No acute osseous finding.  IMPRESSION: No acute cardiopulmonary process.   Original Report Authenticated By: Christiana Pellant, M.D.    EKG: not obtained   Time spent: 30 minutes  Debbora Presto [facility] Pager [phone] If 7PM-7AM, please contact night-coverage [email or web address] Password [facility] 12/10/2012, 4:52 AM

## 2012-12-10 NOTE — Anesthesia Postprocedure Evaluation (Signed)
  Anesthesia Post-op Note  Patient: Tanner [name]  Procedure(s) Performed: Procedure(s) (LRB): OPEN REDUCTION INTERNAL FIXATION (ORIF) ANKLE FRACTURE (Right)  Patient Location: PACU  Anesthesia Type: General  Level of Consciousness: awake and alert   Airway and Oxygen Therapy: Patient Spontanous Breathing  Post-op Pain: mild  Post-op Assessment: Post-op Vital signs reviewed, Patient's Cardiovascular Status Stable, Respiratory Function Stable, Patent Airway and No signs of Nausea or vomiting  Last Vitals:  Filed Vitals:   12/10/12 1742  BP: 138/83  Pulse: 93  Temp: 36.7 C  Resp: 15    Post-op Vital Signs: stable   Complications: No apparent anesthesia complications. Dr. [name] aware of low sodium levels.

## 2012-12-10 NOTE — Anesthesia Preprocedure Evaluation (Addendum)
Anesthesia Evaluation  Patient identified by MRN, date of birth, ID band Patient awake  General Assessment Comment:.  Alcoholic hepatitis     .  Hypertension     Reviewed: Allergy & Precautions, H&P , NPO status , Patient's Chart, lab work & pertinent test results  Airway Mallampati: II TM Distance: >3 FB Neck ROM: Full    Dental no notable dental hx.    Pulmonary Current Smoker,  CXR: No acute process. breath sounds clear to auscultation  Pulmonary exam normal       Cardiovascular Exercise Tolerance: Good hypertension, Pt. on medications and Pt. on home beta blockers negative cardio ROS  Rhythm:Regular Rate:Normal  ECG: NSR with first degree AV block.   Neuro/Psych negative neurological ROS  negative psych ROS   GI/Hepatic negative GI ROS, (+) Hepatitis -, Toxin Related  Endo/Other  negative endocrine ROS  Renal/GU negative Renal ROS  negative genitourinary   Musculoskeletal negative musculoskeletal ROS (+)   Abdominal   Peds negative pediatric ROS (+)  Hematology negative hematology ROS (+)   Anesthesia Other Findings   Reproductive/Obstetrics negative OB ROS                          Anesthesia Physical Anesthesia Plan  ASA: III  Anesthesia Plan: General   Post-op Pain Management:    Induction: Intravenous  Airway Management Planned: Oral ETT  Additional Equipment:   Intra-op Plan:   Post-operative Plan: Extubation in OR  Informed Consent: I have reviewed the patients History and Physical, chart, labs and discussed the procedure including the risks, benefits and alternatives for the proposed anesthesia with the patient or authorized representative who has indicated his/her understanding and acceptance.   Dental advisory given  Plan Discussed with: CRNA  Anesthesia Plan Comments: (At increased risk from smoking, liver dysfunction.)       Anesthesia Quick  Evaluation

## 2012-12-10 NOTE — Progress Notes (Signed)
Pt admitted for R distal fibular fx, placed in temp post splint and pending sx intervention tomorrow afternoon by Dr. [name]. Called regarding nausea/vomitting pt attributing to dilaudid medication, transitioned to morphine and provided zofran with standing order for phenergan if no improvement. Informed Na level 112, pt with hx of Alcoholism and Alcoholic Hepatitis, will consult [facility] on call. Pt has received total of 1000 Nl Saline in ED. Will hold additional fluids pending consult for hyponatremia. Pt NPO, sips and chips with meds only.

## 2012-12-10 NOTE — Transfer of Care (Signed)
Immediate Anesthesia Transfer of Care Note  Patient: Tanner [name]  Procedure(s) Performed: Procedure(s) (LRB): OPEN REDUCTION INTERNAL FIXATION (ORIF) ANKLE FRACTURE (Right)  Patient Location: PACU  Anesthesia Type: General  Level of Consciousness: sedated, patient cooperative and responds to stimulaton  Airway & Oxygen Therapy: Patient Spontanous Breathing and Patient connected to face mask oxgen  Post-op Assessment: Report given to PACU RN and Post -op Vital signs reviewed and stable  Post vital signs: Reviewed and stable  Complications: No apparent anesthesia complications

## 2012-12-10 NOTE — ED Provider Notes (Signed)
History     CSN: [id number]  Arrival date & time 12/09/12  2317   First MD Initiated Contact with Patient 12/10/12 0014      Chief Complaint  Patient presents with  . Fall  . Ankle Injury    (Consider location/radiation/quality/duration/timing/severity/associated sxs/prior treatment) Patient is a 57 y.o. male presenting with fall and lower extremity injury. The history is provided by the patient. No language interpreter was used.  Fall This is a new problem. The current episode started today. Pertinent negatives include no abdominal pain, chest pain, chills, fever, headaches or neck pain. Associated symptoms comments: He lost balance while standing and fell onto right ankle. He complains of injury to ankle without head, neck, chest, or abdominal pain/injury. He reports history of blood pressure and alcoholism. Marland Kitchen  Ankle Injury Pertinent negatives include no abdominal pain, chest pain, chills, fever, headaches or neck pain.    Past Medical History  Diagnosis Date  . Alcoholic hepatitis   . Hypertension     Past Surgical History  Procedure Laterality Date  . Hand surgery    . Hernia repair      History reviewed. No pertinent family history.  History  Substance Use Topics  . Smoking status: Current Every Day Smoker  . Smokeless tobacco: Not on file  . Alcohol Use: Yes      Review of Systems  Constitutional: Negative for fever and chills.  HENT: Negative.  Negative for neck pain.   Respiratory: Negative.   Cardiovascular: Negative.  Negative for chest pain.  Gastrointestinal: Negative.  Negative for abdominal pain.  Musculoskeletal: Negative.        See HPI  Skin: Negative.   Neurological: Negative.  Negative for headaches.  Hematological: Does not bruise/bleed easily.    Allergies  Procaine hcl  Home Medications   Current Outpatient Rx  Name  Route  Sig  Dispense  Refill  . clonazePAM (KLONOPIN) 0.5 MG tablet   Oral   Take 0.5 mg by mouth 4 (four) times  daily.         . metoprolol succinate (TOPROL-XL) 50 MG 24 hr tablet   Oral   Take 50 mg by mouth daily. Take with or immediately following a meal.         . omeprazole (PRILOSEC) 20 MG capsule   Oral   Take 20 mg by mouth daily.           BP 126/81  Pulse 67  Temp(Src) 97.5 F (36.4 C) (Oral)  Resp 18  SpO2 97%  Physical Exam  Constitutional: He is oriented to person, place, and time. He appears well-developed and well-nourished. No distress.  HENT:  Head: Normocephalic and atraumatic.  Pulmonary/Chest: Effort normal. He exhibits no tenderness.  Abdominal: Soft. There is no tenderness.  Musculoskeletal:  Right ankle swollen, mild discoloration. Ankle appears deformed anteriorly. Significant tenderness with any movement.   Neurological: He is alert and oriented to person, place, and time.  Skin: Skin is warm.  Psychiatric: He has a normal mood and affect.    ED Course  Procedures (including critical care time)  Labs Reviewed  CBC WITH DIFFERENTIAL  COMPREHENSIVE METABOLIC PANEL  ETHANOL   Dg Tibia/fibula Right  12/10/2012   *RADIOLOGY REPORT*  Clinical Data: Fall, twisting ankle injury and deformity  RIGHT TIBIA AND FIBULA - 2 VIEW  Comparison: Ankle radiographs same date  Findings: Oblique distal right fibular fracture incompletely visualized.  Proximal tibia and fibula are unremarkable.  No radiopaque foreign  body.  IMPRESSION: Incompletely visualized oblique distal right fibular fracture.   Original Report Authenticated By: Christiana Pellant, M.D.   Dg Ankle Complete Right  12/10/2012   *RADIOLOGY REPORT*  Clinical Data: Fall, twisting ankle injury  RIGHT ANKLE - COMPLETE 3+ VIEW  Comparison: Tibia/fibula films same date  Findings: Mildly comminuted distal right fibular metadiaphyseal fracture noted with apex lateral angulation of the fracture fragments and overlying soft tissue swelling.  There is also tibiotalar dislocation and comminuted posterior malleolar  fracture. Overlying soft tissue swelling is evident.  Well corticated osseous fragment adjacent to the medial malleolus may represent avulsion fracture of unknown chronicity.  IMPRESSION: Comminuted distal right fibular fracture and comminuted posterior malleolar fracture with tibiotalar dislocation.  These results were called by telephone on 12/10/2012 at 12:05 a.m. to Dr. Effie Shy, who verbally acknowledged these results.   Original Report Authenticated By: Christiana Pellant, M.D.     No diagnosis found.  1. Ankle fracture/dislocation  MDM  Discussed fracture with Dr. [name] who advises posterior splint, temporary admitting orders to be placed with surgery planned for tomorrow afternoon.        Arnoldo Hooker, PA-C 12/10/12 0107

## 2012-12-10 NOTE — ED Provider Notes (Signed)
Medical screening examination/treatment/procedure(s) were performed by non-physician practitioner and as supervising physician I was immediately available for consultation/collaboration.  Neetu Carrozza K Tarryn Bogdan-Rasch, MD 12/10/12 0215 

## 2012-12-10 NOTE — Progress Notes (Signed)
Orthopedic Tech Progress Note Patient Details:  Tanner [name] 1956/04/11 [id number]  Ortho Devices Type of Ortho Device: Post (short) splint   Tanner [name] 12/10/2012, 2:10 AM

## 2012-12-10 NOTE — Interval H&P Note (Signed)
History and Physical Interval Note:  12/10/2012 2:15 PM  Tanner [name]  has presented today for surgery, with the diagnosis of right ankle fracture  The various methods of treatment have been discussed with the patient and family. After consideration of risks, benefits and other options for treatment, the patient has consented to  Procedure(s): OPEN REDUCTION INTERNAL FIXATION (ORIF) ANKLE FRACTURE (Right) as a surgical intervention .  The patient's history has been reviewed, patient examined, no change in status, stable for surgery.  I have reviewed the patient's chart and labs.  Questions were answered to the patient's satisfaction.     Mamoudou Mulvehill G

## 2012-12-10 NOTE — Progress Notes (Signed)
BP 159/104  Pulse 79  Temp(Src) 97.8 F (36.6 C) (Oral)  Resp 16  Ht 5\' 10"  (1.778 m)  Wt 83.915 kg (185 lb)  BMI 26.54 kg/m2  SpO2 95% - agree Dr. Elisabeth Pigeon note. - Urinary Na, Cr, Osm, cont IV fluids. - check a b-met in am. - monitor with CIWA and start thiamine and folate.

## 2012-12-10 NOTE — ED Notes (Signed)
Critical NA 112, called to the floor

## 2012-12-11 ENCOUNTER — Encounter (HOSPITAL_COMMUNITY): Payer: Self-pay | Admitting: Orthopaedic Surgery

## 2012-12-11 DIAGNOSIS — R748 Abnormal levels of other serum enzymes: Secondary | ICD-10-CM

## 2012-12-11 DIAGNOSIS — F1010 Alcohol abuse, uncomplicated: Secondary | ICD-10-CM

## 2012-12-11 DIAGNOSIS — E871 Hypo-osmolality and hyponatremia: Secondary | ICD-10-CM

## 2012-12-11 DIAGNOSIS — D696 Thrombocytopenia, unspecified: Secondary | ICD-10-CM

## 2012-12-11 LAB — CBC
MCV: 98.8 fL (ref 78.0–100.0)
Platelets: 106 10*3/uL — ABNORMAL LOW (ref 150–400)
RBC: 3.2 MIL/uL — ABNORMAL LOW (ref 4.22–5.81)
RDW: 12.7 % (ref 11.5–15.5)
WBC: 4.6 10*3/uL (ref 4.0–10.5)

## 2012-12-11 LAB — BASIC METABOLIC PANEL
CO2: 29 mEq/L (ref 19–32)
Calcium: 8.3 mg/dL — ABNORMAL LOW (ref 8.4–10.5)
Creatinine, Ser: 0.88 mg/dL (ref 0.50–1.35)
GFR calc Af Amer: >90 mL/min (ref >90)
GFR calc non Af Amer: >90 mL/min (ref >90)
Sodium: 121 mEq/L — ABNORMAL LOW (ref 135–145)

## 2012-12-11 NOTE — Consult Note (Signed)
[facility] Medical Consultation  Tanner [name] YNW:[id number] DOB: 23-Jan-1956 DOA: 12/09/2012 PCP: No PCP Per Patient   Requesting physician: [facility] Date of consultation: 12/10/2012 Reason for consultation: hyponatremia   Impression/Recommendations  Comminuted distal right fibular fracture and comminuted posterior malleolar fracture with tibiotalar dislocation - s/p ORIF 12/10/2012, doing well post op.  Hyponatremia - has a history of hyponatremia with a thiazide diuretic, that has been discontinued. Recently started on Lasix 2 weeks ago which would be unusual to cause such profound hyponatremia. Patient appeared dehydrated in initial evaluation. Sodium improved with normal saline, continue that today. BMP tomorrow morning.  - Urine osmolality appropriately decreased. Urine sodium 18.  - also states that he drinks 8-12 beers per day, ? Some degree of chronic hyponatremia. - sodium improving with beer cessation and normal saline. If it continues to improve in am, will be OK for discharge with very close follow up, preferably repeat BMP Friday morning in PCP office.   Alcoholic hepatitis - mild elevation of AST in the pattern of alcohol use - extensively counseled today for cessation.   Thrombocytopenia - likely from alcohol induced bone marrow damage - repeat CBC In AM  I will followup again tomorrow. Please contact me if I can be of assistance in the meanwhile. Thank you for this consultation.  Chief Complaint: fracture of right fibula, hyponatremia in patient    Physical Exam: Blood pressure 110/74, pulse 77, temperature 97.6 F (36.4 C), temperature source Oral, resp. rate 20, height 5\' 10"  (1.778 m), weight 83.915 kg (185 lb), SpO2 99.00%. Filed Vitals:   12/10/12 2148 12/11/12 0136 12/11/12 0615 12/11/12 1013  BP: 135/83 145/92 134/80 110/74  Pulse: 87 91 84 77  Temp: 98.3 F (36.8 C) 99.3 F (37.4 C) 98.4 F (36.9 C) 97.6 F (36.4 C)  TempSrc: Oral Oral  Oral Oral  Resp: 20 20 20 20   Height:      Weight:      SpO2: 95% 98% 98% 99%    Physical Exam  Constitutional: NAD  Eyes: Conjunctivae and EOM are normal. PERRLA, no scleral icterus.  Neck: Normal ROM. Neck supple. No JVD. No tracheal deviation. CVS: RRR, S1/S2 +, no murmurs, no gallops, no carotid bruit.  Pulmonary: Effort and breath sounds normal, no stridor, rhonchi, wheezes, rales.  Abdominal: Soft. BS +,  no distension, tenderness, rebound or guarding.  Musculoskeletal: No edema and no tenderness.  Neuro: non focal Skin: Skin is warm and dry. No rash noted. Not diaphoretic. No erythema. No pallor.  Psychiatric: Normal mood and affect. Behavior, judgment, thought content normal.   Labs on Admission:  Basic Metabolic Panel:  Recent Labs Lab 12/10/12 0100 12/10/12 1532 12/11/12 0437  NA 112* 118* 121*  K 4.1 4.6 4.4  CL 76*  --  88*  CO2 24  --  29  GLUCOSE 95 99 112*  BUN 9  --  5*  CREATININE 1.04  --  0.88  CALCIUM 8.5  --  8.3*   Liver Function Tests:  Recent Labs Lab 12/10/12 0100  AST 44*  ALT 46  ALKPHOS 61  BILITOT 0.3  PROT 7.0  ALBUMIN 3.5   CBC:  Recent Labs Lab 12/10/12 0100 12/10/12 1532 12/11/12 0437  WBC 5.8  --  4.6  NEUTROABS 4.5  --   --   HGB 12.9* 13.6 10.9*  HCT 34.6* 40.0 31.6*  MCV 94.3  --  98.8  PLT 138*  --  106*    Radiological Exams on  Admission: Dg Tibia/fibula Right  12/10/2012   *RADIOLOGY REPORT*  Clinical Data: Fall, twisting ankle injury and deformity  RIGHT TIBIA AND FIBULA - 2 VIEW  Comparison: Ankle radiographs same date  Findings: Oblique distal right fibular fracture incompletely visualized.  Proximal tibia and fibula are unremarkable.  No radiopaque foreign body.  IMPRESSION: Incompletely visualized oblique distal right fibular fracture.   Original Report Authenticated By: Christiana Pellant, M.D.   Dg Ankle Complete Right  12/10/2012   *RADIOLOGY REPORT*  Clinical Data: Fall, twisting ankle injury  RIGHT ANKLE -  COMPLETE 3+ VIEW  Comparison: Tibia/fibula films same date  Findings: Mildly comminuted distal right fibular metadiaphyseal fracture noted with apex lateral angulation of the fracture fragments and overlying soft tissue swelling.  There is also tibiotalar dislocation and comminuted posterior malleolar fracture. Overlying soft tissue swelling is evident.  Well corticated osseous fragment adjacent to the medial malleolus may represent avulsion fracture of unknown chronicity.  IMPRESSION: Comminuted distal right fibular fracture and comminuted posterior malleolar fracture with tibiotalar dislocation.  These results were called by telephone on 12/10/2012 at 12:05 a.m. to Dr. Effie Shy, who verbally acknowledged these results.   Original Report Authenticated By: Christiana Pellant, M.D.   Dg Chest Portable 1 View  12/10/2012   *RADIOLOGY REPORT*  Clinical Data: Ankle fracture, preoperative exam  PORTABLE CHEST - 1 VIEW  Comparison: None.  Findings: Mild S-shaped curvature of the thoracic spine.  Heart size upper limits of normal. Diffusely increased interstitial lung markings noted, without focal pulmonary opacity.  No pleural effusion.  No acute osseous finding.  IMPRESSION: No acute cardiopulmonary process.   Original Report Authenticated By: Christiana Pellant, M.D.   EKG: not obtained   Time spent: 35 minutes  Pamella Pert [facility] Pager [phone] If 7PM-7AM, please contact night-coverage [email or web address] Password [facility] 12/11/2012, 12:05 PM

## 2012-12-11 NOTE — Op Note (Signed)
Tanner [name], Tanner [name]                ACCOUNT NO.:  [id number]  MEDICAL RECORD NO.:  [id number]  LOCATION:  1620                         FACILITY:  [facility]  PHYSICIAN:  Lubertha Basque. Jaquell Seddon, M.D.DATE OF BIRTH:  12-30-1955  DATE OF PROCEDURE:  12/10/2012 DATE OF DISCHARGE:                              OPERATIVE REPORT   PREOPERATIVE DIAGNOSIS:  Right ankle fracture.  POSTOPERATIVE DIAGNOSIS:  Right ankle fracture.  PROCEDURE:  Right ankle open reduction internal fixation.  ANESTHESIA:  General.  ATTENDING SURGEON:  Lubertha Basque. [name], M.D.  ASSISTANTHarolyn Rutherford, PA  INDICATION FOR PROCEDURE:  The patient is a 57 year old man who fell yesterday and suffered a widely displaced fracture of the ankle.  He came in around midnight intoxicated.  He was admitted for rehydration with a sodium under 120, and was scheduled for today's operation. Informed operative consent was obtained after discussion of possible complications including reaction to anesthesia, infection, neurovascular injury, and malunion.  SUMMARY OF FINDINGS AND PROCEDURE:  Under general anesthesia, a right ankle fracture was reduced and stabilized using the DePuy small fragment set.  I placed an 8 hole 1/3 tubular plate along the fibula, which was moderately comminuted.  I then placed 2 front to back cancellous screws reducing a relatively large posterior malleolar fragment.  I stressed the syndesmosis and this seemed to be intact, and I did not elect to place a syndesmotic screw.  I used fluoroscopy throughout the case to make appropriate intraoperative decisions and read all of these views myself.  Carnaghi assisted throughout and was invaluable to the completion of the case that he helped maintain reduction while I placed hardware.  He also closed simultaneously to help minimize OR time.  DESCRIPTION OF PROCEDURE:  The patient was taken to the operating suite where general anesthetic was applied without difficulty.  He  was positioned supine with a bump under the right hip and prepped and draped in normal sterile fashion.  After the administration of IV Kefzol and an appropriate time out, the right leg was elevated, exsanguinated, and tourniquet inflated about the thigh.  I made a lateral incision centered at the fibular fracture site with dissection down to the bone.  This was reduced in relatively anatomic position and then stabilized with an 8 hole 1/3 tubular plate from the DePuy small fragment set.  We filled all of the screw holes.  Fluoroscopy was used to confirm adequate reduction of the fracture.  The mortise was relatively reduced at that point.  I then placed an elevator behind the tibia in the area of the posterior malleolar fragment and persists down several millimeters to near anatomic alignment with the anterior portion.  This was then stabilized with a clamp and I placed 2 anterior to posterior cannulated screws also from the small fragment set of DePuy.  We seemed to engage the posterior malleolar fragment fairly well.  The ankle was examined under fluoroscopy.  The wounds were irrigated followed by reapproximation of deep tissues with 0 Vicryl and subcutaneous tissues with 2-0 undyed Vicryl.  Skin was closed with staples.  The tourniquet was deflated and the foot became pink and warm immediately.  Adaptic was applied.  The wounds followed by dry gauze and a posterior splint of plaster with the ankle in neutral position.  Estimated blood loss and fluids obtained from anesthesia records as well as an accurate tourniquet time.  DISPOSITION:  The patient was extubated in the operating room and taken to recovery room in stable condition.  Plans were for to be admitted back to the Orthopedic Surgery Service for physical therapy and ice and elevation.  We will also have the medical team help [place] with addressing his hyponatremia prior to potential discharge home tomorrow.     Lubertha Basque  [name], M.D.     PGD/MEDQ  D:  12/10/2012  T:  12/11/2012  Job:  [id number]

## 2012-12-11 NOTE — Discharge Summary (Signed)
Patient ID: Tanner [name] MRN: [id number] DOB/AGE: 03/09/1956 57 y.o.  Admit date: 12/09/2012 Discharge date: 12/12/12  Admission Diagnoses:  Active Problems:   Hyponatremia   Ankle fracture   Discharge Diagnoses:  Same  Past Medical History  Diagnosis Date  . Alcoholic hepatitis   . Hypertension     Surgeries: Procedure(s): OPEN REDUCTION INTERNAL FIXATION (ORIF) ANKLE FRACTURE on 12/09/2012 - 12/10/2012   Consultants: Treatment Team:  Dorothea Ogle, MD Lilyan Gilford, MD  Discharged Condition: Improved  Hospital Course: Tanner [name] is an 57 y.o. male who was admitted 12/09/2012 for operative treatment ofRight ankle fracture displaced.. Patient has severe unremitting pain that affects sleep, daily activities, and work/hobbies. After pre-op clearance the patient was taken to the operating room on 12/09/2012 - 12/10/2012 and underwent  Procedure(s): OPEN REDUCTION INTERNAL FIXATION (ORIF) ANKLE FRACTURE.    Patient was given perioperative antibiotics: Anti-infectives   Start     Dose/Rate Route Frequency Ordered Stop   12/10/12 2100  ceFAZolin (ANCEF) IVPB 2 g/50 mL premix     2 g 100 mL/hr over 30 Minutes Intravenous Every 6 hours 12/10/12 1748 12/11/12 0320   12/10/12 1534  polymyxin B 500,000 Units, bacitracin 50,000 Units in sodium chloride irrigation 0.9 % 500 mL irrigation  Status:  Discontinued       As needed 12/10/12 1535 12/10/12 1643       Patient was given sequential compression devices, early ambulation, and chemoprophylaxis to prevent DVT.He was placed on ASA 325 twice a day x2 weeks.  A hospitalist consult was obtained for hyponatremia.  Patient benefited maximally from hospital stay and there were no complications.    Recent vital signs: Patient Vitals for the past 24 hrs:  BP Temp Temp src Pulse Resp SpO2  12/11/12 1013 110/74 mmHg 97.6 F (36.4 C) Oral 77 20 99 %  12/11/12 0615 134/80 mmHg 98.4 F (36.9 C) Oral 84 20 98 %  12/11/12 0136 145/92 mmHg 99.3 F  (37.4 C) Oral 91 20 98 %  12/10/12 2148 135/83 mmHg 98.3 F (36.8 C) Oral 87 20 95 %  12/10/12 2034 107/69 mmHg 98 F (36.7 C) - 83 20 96 %  12/10/12 1936 140/83 mmHg 98.3 F (36.8 C) - 82 20 99 %  12/10/12 1845 134/91 mmHg 98.7 F (37.1 C) - 86 20 98 %  12/10/12 1742 138/83 mmHg 98 F (36.7 C) - 93 15 95 %  12/10/12 1730 140/79 mmHg 98 F (36.7 C) - - - -  12/10/12 1715 153/88 mmHg 98 F (36.7 C) - 87 11 100 %  12/10/12 1700 134/82 mmHg - - 92 12 100 %  12/10/12 1648 157/77 mmHg 98.3 F (36.8 C) - 96 16 98 %  12/10/12 1347 189/101 mmHg 99.7 F (37.6 C) Oral 93 20 95 %     Recent laboratory studies:  Recent Labs  12/10/12 0100 12/10/12 1532 12/11/12 0437  WBC 5.8  --  4.6  HGB 12.9* 13.6 10.9*  HCT 34.6* 40.0 31.6*  PLT 138*  --  106*  NA 112* 118* 121*  K 4.1 4.6 4.4  CL 76*  --  88*  CO2 24  --  29  BUN 9  --  5*  CREATININE 1.04  --  0.88  GLUCOSE 95 99 112*  CALCIUM 8.5  --  8.3*     Discharge Medications:     Medication List    TAKE these medications       aspirin EC 325  MG tablet  Take 1 tablet (325 mg total) by mouth 2 (two) times daily.     clonazePAM 0.5 MG tablet  Commonly known as:  KLONOPIN  Take 0.5 mg by mouth 4 (four) times daily.     doxazosin 8 MG tablet  Commonly known as:  CARDURA  Take 8 mg by mouth 2 (two) times daily.     furosemide 40 MG tablet  Commonly known as:  LASIX  Take 20 mg by mouth daily.     lisinopril 40 MG tablet  Commonly known as:  PRINIVIL,ZESTRIL  Take 40 mg by mouth daily.     metoprolol 50 MG tablet  Commonly known as:  LOPRESSOR  Take 50 mg by mouth 2 (two) times daily.     omeprazole 40 MG capsule  Commonly known as:  PRILOSEC  Take 40 mg by mouth daily.     oxyCODONE-acetaminophen 5-325 MG per tablet  Commonly known as:  ROXICET  Take 1-2 tablets by mouth every 4 (four) hours as needed for pain.        Diagnostic Studies: Dg Tibia/fibula Right  12/10/2012   *RADIOLOGY REPORT*  Clinical  Data: Fall, twisting ankle injury and deformity  RIGHT TIBIA AND FIBULA - 2 VIEW  Comparison: Ankle radiographs same date  Findings: Oblique distal right fibular fracture incompletely visualized.  Proximal tibia and fibula are unremarkable.  No radiopaque foreign body.  IMPRESSION: Incompletely visualized oblique distal right fibular fracture.   Original Report Authenticated By: Christiana Pellant, M.D.   Dg Ankle Complete Right  12/10/2012   *RADIOLOGY REPORT*  Clinical Data: Fall, twisting ankle injury  RIGHT ANKLE - COMPLETE 3+ VIEW  Comparison: Tibia/fibula films same date  Findings: Mildly comminuted distal right fibular metadiaphyseal fracture noted with apex lateral angulation of the fracture fragments and overlying soft tissue swelling.  There is also tibiotalar dislocation and comminuted posterior malleolar fracture. Overlying soft tissue swelling is evident.  Well corticated osseous fragment adjacent to the medial malleolus may represent avulsion fracture of unknown chronicity.  IMPRESSION: Comminuted distal right fibular fracture and comminuted posterior malleolar fracture with tibiotalar dislocation.  These results were called by telephone on 12/10/2012 at 12:05 a.m. to Dr. Effie Shy, who verbally acknowledged these results.   Original Report Authenticated By: Christiana Pellant, M.D.   Dg Chest Portable 1 View  12/10/2012   *RADIOLOGY REPORT*  Clinical Data: Ankle fracture, preoperative exam  PORTABLE CHEST - 1 VIEW  Comparison: None.  Findings: Mild S-shaped curvature of the thoracic spine.  Heart size upper limits of normal. Diffusely increased interstitial lung markings noted, without focal pulmonary opacity.  No pleural effusion.  No acute osseous finding.  IMPRESSION: No acute cardiopulmonary process.   Original Report Authenticated By: Christiana Pellant, M.D.    Disposition: 01-Home or Self Care      Discharge Orders   Future Orders Complete By Expires     Call MD / Call 911  As directed      Comments:      If you experience chest pain or shortness of breath, CALL 911 and be transported to the hospital emergency room.  If you develope a fever above 101 F, pus (white drainage) or increased drainage or redness at the wound, or calf pain, call your surgeon's office.    Constipation Prevention  As directed     Comments:      Drink plenty of fluids.  Prune juice may be helpful.  You may use a stool softener, such as  Colace (over the counter) 100 mg twice a day.  Use MiraLax (over the counter) for constipation as needed.    Diet - low sodium heart healthy  As directed     Increase activity slowly as tolerated  As directed      He is to remain nonweightbearing on the right side with crutches or walker.  He will continue with ASA 325 twice a day x2 weeks until his return to our office.  He is instructed to followup with his regular medical Dr. In regards to hyponatremia.He will need a followup BMP on Friday.  Dr. Chilton Si is his PCP Dr.  Follow-up Information   Follow up with Velna Ochs, MD In 11 days.   Contact information:   [address] [place] [address] [phone]        Signed: Prince Rome 12/11/2012, 12:26 PM

## 2012-12-11 NOTE — Progress Notes (Signed)
Physical Therapy Treatment Note   12/11/12 1400  PT Visit Information  Last PT Received On 12/11/12  Assistance Needed +2  PT Time Calculation  PT Start Time 1433  PT Stop Time 1445  PT Time Calculation (min) 12 min  Subjective Data  Subjective Yeah this is much better.  (RW)  Precautions  Precautions Fall  Restrictions  Weight Bearing Restrictions Yes  RLE Weight Bearing NWB  Cognition  Arousal/Alertness Awake/alert  Behavior During Therapy WFL for tasks assessed/performed  Overall Cognitive Status Within Functional Limits for tasks assessed  Bed Mobility  Bed Mobility Supine to Sit;Sit to Supine  Supine to Sit 6: Modified independent (Device/Increase time)  Sit to Supine 6: Modified independent (Device/Increase time)  Transfers  Transfers Sit to Stand;Stand to Sit  Sit to Stand 4: Min guard;With upper extremity assist;From bed  Stand to Sit 4: Min guard;With upper extremity assist;To bed  Details for Transfer Assistance verbal cues for safe technique  Ambulation/Gait  Ambulation/Gait Assistance 4: Min guard  Ambulation Distance (Feet) 80 Feet  Assistive device Rolling walker  Ambulation/Gait Assistance Details demonstrated technique with RW and pt agreeable to use, verbal cues for technique with RW, pt did much better with RW compared to crutches, more steady  PT - End of Session  Activity Tolerance Patient tolerated treatment well  Patient left in bed;with call bell/phone within reach;with family/visitor present  PT - Assessment/Plan  Comments on Treatment Session Pt performed gait training with RW and improved steadiness and safety observed with RW vs crutches.  Pt will need RW for home.  PT Plan Discharge plan remains appropriate;Frequency remains appropriate  Follow Up Recommendations Home health PT;Supervision/Assistance - 24 hour  PT equipment Rolling walker with 5" wheels  Acute Rehab PT Goals  PT Goal: Sit to Stand - Progress Progressing toward goal  PT Goal:  Stand to Sit - Progress Progressing toward goal  PT Goal: Ambulate - Progress Progressing toward goal  PT General Charges  $$ ACUTE PT VISIT 1 Procedure  PT Treatments  $Gait Training 8-22 mins   Premedicated, elevated R LE  Zenovia Jarred, PT, DPT 12/11/2012 Pager: [phone]

## 2012-12-11 NOTE — Progress Notes (Signed)
I spoke with the hospitalist today and he feels that if his sodium is trending up tomorrow he can be discharged home.  He will need to follow up with his PCP Dr. And have a BMP drawn on Friday.  Physical therapy will also work with him today and probably tomorrow before he leaves.

## 2012-12-11 NOTE — Evaluation (Signed)
Physical Therapy Evaluation Patient Details Name: Teron Blais MRN: [id number] DOB: 04/21/1956 Today's Date: 12/11/2012 Time: 9528-4132 PT Time Calculation (min): 20 min  PT Assessment / Plan / Recommendation Clinical Impression  Pt is a 57 year old male admitted for comminuted distal right fibular fracture and comminuted posterior malleolar fracture with tibiotalar dislocation s/p ORIF 6/3.  Pt would benefit from acute PT services in order to improve indepedence with transfers and ambulation to prepare for safe d/c home with family/friends to assist 24/7 per pt.  Pt unsteady during evaluation during ambulation with crutches requiring assist to steady.  Pt encouraged to have person present for any mobility for safety as well as to elevate R LE.  Pt states family/friends can assist with steps into home with w/c (also provided stairs w/ w/c handout).    PT Assessment  Patient needs continued PT services    Follow Up Recommendations  Home health PT;Supervision/Assistance - 24 hour    Does the patient have the potential to tolerate intense rehabilitation      Barriers to Discharge        Equipment Recommendations  None recommended by PT (ortho tech called to bring crutches)    Recommendations for Other Services     Frequency Min 5X/week    Precautions / Restrictions Precautions Precautions: Fall Restrictions Weight Bearing Restrictions: Yes RLE Weight Bearing: Non weight bearing   Pertinent Vitals/Pain Premedicated,elevated R LE      Mobility  Bed Mobility Bed Mobility: Supine to Sit;Sit to Supine Supine to Sit: 5: Supervision Sit to Supine: 5: Supervision Transfers Transfers: Sit to Stand;Stand to Sit Sit to Stand: 4: Min assist;From bed;With upper extremity assist Stand to Sit: 3: Mod assist;To bed;With upper extremity assist Details for Transfer Assistance: verbal cues for safe technique including crutch placement, assist to steady, increased assist to control turning  just prior to sitting as pt had LOB Ambulation/Gait Ambulation/Gait Assistance: 4: Min assist Ambulation Distance (Feet): 60 Feet Assistive device: Crutches Ambulation/Gait Assistance Details: pt declined RW, assist for steady occasionally throughout gait, verbal cues for NWB    Exercises     PT Diagnosis: Difficulty walking;Acute pain  PT Problem List: Decreased activity tolerance;Decreased balance;Decreased mobility;Decreased knowledge of precautions;Decreased knowledge of use of DME;Pain PT Treatment Interventions: DME instruction;Gait training;Stair training;Functional mobility training;Therapeutic activities;Therapeutic exercise;Patient/family education   PT Goals Acute Rehab PT Goals PT Goal Formulation: With patient Time For Goal Achievement: 12/18/12 Potential to Achieve Goals: Good Pt will go Sit to Stand: with modified independence PT Goal: Sit to Stand - Progress: Goal set today Pt will go Stand to Sit: with modified independence PT Goal: Stand to Sit - Progress: Goal set today Pt will Ambulate: 51 - 150 feet;with modified independence;with least restrictive assistive device PT Goal: Ambulate - Progress: Goal set today  Visit Information  Last PT Received On: 12/11/12 Assistance Needed: +2    Subjective Data  Subjective: I've used crutches before. Patient Stated Goal: home   Prior Functioning  Home Living Lives With: Spouse Available Help at Discharge: Family;Available 24 hours/day Type of Home: Mobile home Home Access: Stairs to enter Entrance Stairs-Number of Steps: 4-5 Entrance Stairs-Rails: Right;Left Home Layout: One level Home Adaptive Equipment: Wheelchair - manual Prior Function Level of Independence: Independent Communication Communication: No difficulties    Cognition  Cognition Arousal/Alertness: Awake/alert Behavior During Therapy: WFL for tasks assessed/performed Overall Cognitive Status: Within Functional Limits for tasks assessed     Extremity/Trunk Assessment Right Upper Extremity Assessment RUE ROM/Strength/Tone: [facility] for  tasks assessed Left Upper Extremity Assessment LUE ROM/Strength/Tone: [facility] for tasks assessed Right Lower Extremity Assessment RLE ROM/Strength/Tone: Deficits RLE ROM/Strength/Tone Deficits: ankle in cast otherwise WFL and able to wiggle toes Left Lower Extremity Assessment LLE ROM/Strength/Tone: [facility] for tasks assessed   Balance    End of Session PT - End of Session Equipment Utilized During Treatment: Gait belt Activity Tolerance: Patient tolerated treatment well Patient left: in bed;with call bell/phone within reach;with family/visitor present  GP     Loria Lacina,KATHrine E 12/11/2012, 1:28 PM Zenovia Jarred, PT, DPT 12/11/2012 Pager: [phone]

## 2012-12-11 NOTE — Progress Notes (Signed)
Subjective: 1 Day Post-Op Procedure(s) (LRB): OPEN REDUCTION INTERNAL FIXATION (ORIF) ANKLE FRACTURE (Right)  Activity level:  bedrest Diet tolerance:  regular Voiding:  well Patient reports pain as mild.    Objective: Vital signs in last 24 hours: Temp:  [98 F (36.7 C)-99.7 F (37.6 C)] 98.4 F (36.9 C) (06/04 0615) Pulse Rate:  [82-96] 84 (06/04 0615) Resp:  [11-20] 20 (06/04 0615) BP: (107-189)/(69-101) 134/80 mmHg (06/04 0615) SpO2:  [95 %-100 %] 98 % (06/04 0615)  Labs:  Recent Labs  12/10/12 0100 12/10/12 1532 12/11/12 0437  HGB 12.9* 13.6 10.9*    Recent Labs  12/10/12 0100 12/10/12 1532 12/11/12 0437  WBC 5.8  --  4.6  RBC 3.67*  --  3.20*  HCT 34.6* 40.0 31.6*  PLT 138*  --  106*    Recent Labs  12/10/12 0100 12/10/12 1532 12/11/12 0437  NA 112* 118* 121*  K 4.1 4.6 4.4  CL 76*  --  88*  CO2 24  --  29  BUN 9  --  5*  CREATININE 1.04  --  0.88  GLUCOSE 95 99 112*  CALCIUM 8.5  --  8.3*   No results found for this basename: LABPT, INR,  in the last 72 hours  Physical Exam:  Neurologically intact ABD soft Neurovascular intact Intact pulses distally Dorsiflexion/Plantar flexion intact Compartment soft  Assessment/Plan:  1 Day Post-Op Procedure(s) (LRB): OPEN REDUCTION INTERNAL FIXATION (ORIF) ANKLE FRACTURE (Right) Up with therapy NWB with crutches  Medical team to follow sodium which is a bit better today at 10 Dr Chilton Si may also come by  OK to go home today orthopedically    Taleeyah Bora G 12/11/2012, 8:15 AM

## 2012-12-12 LAB — CBC
MCH: 33.8 pg (ref 26.0–34.0)
MCHC: 33 g/dL (ref 30.0–36.0)
MCV: 102.2 fL — ABNORMAL HIGH (ref 78.0–100.0)
Platelets: 112 10*3/uL — ABNORMAL LOW (ref 150–400)
RBC: 3.2 MIL/uL — ABNORMAL LOW (ref 4.22–5.81)

## 2012-12-12 LAB — BASIC METABOLIC PANEL
BUN: 8 mg/dL (ref 6–23)
CO2: 28 mEq/L (ref 19–32)
Calcium: 8.5 mg/dL (ref 8.4–10.5)
GFR calc non Af Amer: >90 mL/min (ref >90)
Glucose, Bld: 103 mg/dL — ABNORMAL HIGH (ref 70–99)
Sodium: 126 mEq/L — ABNORMAL LOW (ref 135–145)

## 2012-12-12 NOTE — Care Management Note (Signed)
    Page 1 of 2   12/12/2012     3:32:55 PM   CARE MANAGEMENT NOTE 12/12/2012  Patient:  Tanner [name], Tanner [name]   Account Number:  [id number]  Date Initiated:  12/11/2012  Documentation initiated by:  Colleen Can  Subjective/Objective Assessment:   dx comminuted distal rt fibular fx and comminuted posterior malleolar fracture with dislocation; ORIF     Action/Plan:   CM spoke with patient who plans to go home in [place] where his daughter and sister will assist with his care. He has access to w/c but will need RW and 3n1.   Anticipated DC Date:  12/12/2012   Anticipated DC Plan:  HOME/SELF CARE  In-house referral  Financial Counselor      DC Planning Services  CM consult      [facility] Choice  DURABLE MEDICAL EQUIPMENT   Choice offered to / List presented to:  C-1 Patient   DME arranged  3-N-1  Levan Hurst      DME agency  [facility]        Status of service:  Completed, signed off Medicare Important Message given?   (If response is "NO", the following Medicare IM given date fields will be blank) Date Medicare IM given:   Date Additional Medicare IM given:    Discharge Disposition:  HOME/SELF CARE  Per UR Regulation:  Reviewed for med. necessity/level of care/duration of stay  If discussed at Long Length of Stay Meetings, dates discussed:    Comments:  12/12/2012 Colleen Can BSN RN CCM [phone] Pt states he is without health insurance. He has been seen by financial counselor and advised. Pt states he does have PCP-Dr Nila Nephew who he follows up with. Goes to [facility] where he gets routine meds. States he uses generic brand and has been able to afford. Pt will have 2 prescriptions upon discharge and he states he will be able to get prescriptions filled and pay for them. Pt will not be able to pay service fee for each visit by [facility] agency for HHpt; [facility] advised that he does not meet Medicaid guidelines for indigent services(pt  is medicaid potential status). CM checked with Genevieve Norlander who is also unable to provide [facility] services. Pt states he has received PT instructions via hospital physical therapist and understands them. States he will be non weight bearing and will be following up with his othopedist and PCP.

## 2012-12-12 NOTE — Progress Notes (Signed)
PT Cancellation Note  Patient Details Name: Tanner [name] MRN: [id number] DOB: [birth year]   Cancelled Treatment:    Reason Eval/Treat Not Completed: Fatigue/lethargy limiting ability to participate;Pain limiting ability to participate Pt reports being very sore from yesterday and he feels comfortable now with RW.  Pt also states he has assist and w/c upon d/c home and would like to pass on therapy today since he will be d/c home with assist.   Darrien Belter,KATHrine E 12/12/2012, 11:23 AM Zenovia Jarred, PT, DPT 12/12/2012 Pager: [phone]

## 2012-12-12 NOTE — Progress Notes (Signed)
Subjective: 2 Days Post-Op Procedure(s) (LRB): OPEN REDUCTION INTERNAL FIXATION (ORIF) ANKLE FRACTURE (Right)  Activity level:  nwb right leg Diet tolerance:  ok Voiding:  ok Patient reports pain as 2 on 0-10 scale.    Objective: Vital signs in last 24 hours: Temp:  [97.6 F (36.4 C)-98.8 F (37.1 C)] 98.8 F (37.1 C) (06/05 0454) Pulse Rate:  [77-84] 84 (06/05 0454) Resp:  [16-20] 16 (06/05 0454) BP: (110-137)/(74-90) 137/90 mmHg (06/05 0454) SpO2:  [90 %-99 %] 92 % (06/05 0500)  Labs:  Recent Labs  12/10/12 0100 12/10/12 1532 12/11/12 0437 12/12/12 0453  HGB 12.9* 13.6 10.9* 10.8*    Recent Labs  12/11/12 0437 12/12/12 0453  WBC 4.6 5.2  RBC 3.20* 3.20*  HCT 31.6* 32.7*  PLT 106* 112*    Recent Labs  12/11/12 0437 12/12/12 0453  NA 121* 126*  K 4.4 4.1  CL 88* 93*  CO2 29 28  BUN 5* 8  CREATININE 0.88 0.91  GLUCOSE 112* 103*  CALCIUM 8.3* 8.5   No results found for this basename: LABPT, INR,  in the last 72 hours  Physical Exam:  Neurologically intact ABD soft Neurovascular intact Sensation intact distally Intact pulses distally No cellulitis present Compartment soft  Assessment/Plan:  2 Days Post-Op Procedure(s) (LRB): OPEN REDUCTION INTERNAL FIXATION (ORIF) ANKLE FRACTURE (Right) Advance diet Up with therapy D/c home nwb right leg Dr. Neva Seat mo  For repeat bmp Asa 325 bid x 2weeks  rto 10 days    Tanner [name] 12/12/2012, 9:23 AM

## 2012-12-12 NOTE — Consult Note (Signed)
[facility] Medical Consultation  Tanner [name] WUJ:[id number] DOB: [birth year] DOA: 12/09/2012 PCP: No PCP Per Patient   Requesting physician: [facility] Date of consultation: 12/10/2012 Reason for consultation: hyponatremia   Impression/Recommendations  Comminuted distal right fibular fracture and comminuted posterior malleolar fracture with tibiotalar dislocation - s/p ORIF 12/10/2012, doing well post op.  Hyponatremia - has a history of hyponatremia with a thiazide diuretic, that has been discontinued. Recently started on Lasix 2 weeks ago which would be unusual to cause hyponatremia. Patient appeared dehydrated in initial evaluation. Sodium improved with normal saline. Strongly advised patient against drinking large quantities of beer as this definitely contributes to his Na levels. Expressed understanding but is not sure he will quit at this point. Advised patient to follow up with his PCP tomorrow or on Monday for repeat blood work. Na this morning 126, improving nicely from 121 yesterday morning.   - reasonable to discharge home today with very close follow up with his PCP. Patient counseled and expressed understanding.   Alcoholic hepatitis - mild elevation of AST in the pattern of alcohol use - recommend that he stops drinking altogether.   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 Alcohol cessation.   Physical Exam: Blood pressure 137/90, pulse 84, temperature 98.8 F (37.1 C), temperature source Oral, resp. rate 16, height 5\' 10"  (1.778 m), weight 83.915 kg (185 lb), SpO2 92.00%. Filed Vitals:   12/11/12 2005 12/11/12 2146 12/12/12 0454 12/12/12 0500  BP:  126/82 137/90   Pulse:  82 84   Temp:  98.4 F (36.9 C) 98.8 F (37.1 C)   TempSrc:  Oral Oral   Resp:  16 16   Height:      Weight:      SpO2: 96% 95% 90% 92%    Physical Exam  Constitutional: NAD  Eyes: Conjunctivae and EOM are normal. PERRLA, no scleral icterus.   Neck: Normal ROM. Neck supple. No JVD. No tracheal deviation. CVS: RRR, S1/S2 +, no murmurs, no gallops, no carotid bruit.  Pulmonary: Effort and breath sounds normal, no stridor, rhonchi, wheezes, rales.  Abdominal: Soft. BS +,  no distension, tenderness, rebound or guarding.  Musculoskeletal: No edema and no tenderness.  Neuro: non focal Psychiatric: Normal mood and affect. Behavior, judgment, thought content normal.   Labs on Admission:  Basic Metabolic Panel:  Recent Labs Lab 12/10/12 0100 12/10/12 1532 12/11/12 0437 12/12/12 0453  NA 112* 118* 121* 126*  K 4.1 4.6 4.4 4.1  CL 76*  --  88* 93*  CO2 24  --  29 28  GLUCOSE 95 99 112* 103*  BUN 9  --  5* 8  CREATININE 1.04  --  0.88 0.91  CALCIUM 8.5  --  8.3* 8.5   Liver Function Tests:  Recent Labs Lab 12/10/12 0100  AST 44*  ALT 46  ALKPHOS 61  BILITOT 0.3  PROT 7.0  ALBUMIN 3.5   CBC:  Recent Labs Lab 12/10/12 0100 12/10/12 1532 12/11/12 0437 12/12/12 0453  WBC 5.8  --  4.6 5.2  NEUTROABS 4.5  --   --   --   HGB 12.9* 13.6 10.9* 10.8*  HCT 34.6* 40.0 31.6* 32.7*  MCV 94.3  --  98.8 102.2*  PLT 138*  --  106* 112*    Radiological Exams on Admission: Dg Tibia/fibula Right  12/10/2012   *RADIOLOGY REPORT*  Clinical Data: Fall, twisting ankle injury and deformity  RIGHT TIBIA AND FIBULA - 2 VIEW  Comparison: Ankle radiographs same date  Findings: Oblique distal right fibular fracture incompletely visualized.  Proximal tibia and fibula are unremarkable.  No radiopaque foreign body.  IMPRESSION: Incompletely visualized oblique distal right fibular fracture.   Original Report Authenticated By: Christiana Pellant, M.D.   Dg Ankle Complete Right  12/10/2012   *RADIOLOGY REPORT*  Clinical Data: Fall, twisting ankle injury  RIGHT ANKLE - COMPLETE 3+ VIEW  Comparison: Tibia/fibula films same date  Findings: Mildly comminuted distal right fibular metadiaphyseal fracture noted with apex lateral angulation of the fracture  fragments and overlying soft tissue swelling.  There is also tibiotalar dislocation and comminuted posterior malleolar fracture. Overlying soft tissue swelling is evident.  Well corticated osseous fragment adjacent to the medial malleolus may represent avulsion fracture of unknown chronicity.  IMPRESSION: Comminuted distal right fibular fracture and comminuted posterior malleolar fracture with tibiotalar dislocation.  These results were called by telephone on 12/10/2012 at 12:05 a.m. to Dr. Effie Shy, who verbally acknowledged these results.   Original Report Authenticated By: Christiana Pellant, M.D.   Dg Chest Portable 1 View  12/10/2012   *RADIOLOGY REPORT*  Clinical Data: Ankle fracture, preoperative exam  PORTABLE CHEST - 1 VIEW  Comparison: None.  Findings: Mild S-shaped curvature of the thoracic spine.  Heart size upper limits of normal. Diffusely increased interstitial lung markings noted, without focal pulmonary opacity.  No pleural effusion.  No acute osseous finding.  IMPRESSION: No acute cardiopulmonary process.   Original Report Authenticated By: Christiana Pellant, M.D.   Time spent: 25 minutes  Pamella Pert [facility] Pager [phone] If 7PM-7AM, please contact night-coverage [email or web address] Password [email or web address] 12/12/2012, 8:11 AM

## 2013-05-15 ENCOUNTER — Other Ambulatory Visit: Payer: Self-pay

## 2013-05-22 DIAGNOSIS — C61 Malignant neoplasm of prostate: Secondary | ICD-10-CM | POA: Insufficient documentation

## 2013-05-22 HISTORY — DX: Malignant neoplasm of prostate: C61

## 2013-05-28 ENCOUNTER — Other Ambulatory Visit (HOSPITAL_COMMUNITY): Payer: Self-pay | Admitting: Urology

## 2013-05-28 DIAGNOSIS — C61 Malignant neoplasm of prostate: Secondary | ICD-10-CM

## 2013-06-13 ENCOUNTER — Encounter (HOSPITAL_COMMUNITY)
Admission: RE | Admit: 2013-06-13 | Discharge: 2013-06-13 | Disposition: A | Discharge status: Home / Self Care | Payer: Medicaid Other | Source: Ambulatory Visit | Attending: Urology | Admitting: Urology

## 2013-06-13 ENCOUNTER — Encounter (HOSPITAL_COMMUNITY): Payer: Self-pay

## 2013-06-13 DIAGNOSIS — C61 Malignant neoplasm of prostate: Secondary | ICD-10-CM

## 2013-06-13 HISTORY — DX: Malignant neoplasm of prostate: C61

## 2013-06-13 MED ORDER — TECHNETIUM TC 99M MEDRONATE IV KIT
24.7000 | PACK | Freq: Once | INTRAVENOUS | Status: AC | PRN
  Administered 2013-06-13: 24.7 via INTRAVENOUS

## 2013-06-16 ENCOUNTER — Other Ambulatory Visit (HOSPITAL_COMMUNITY): Payer: Self-pay | Admitting: Urology

## 2013-06-16 DIAGNOSIS — R937 Abnormal findings on diagnostic imaging of other parts of musculoskeletal system: Secondary | ICD-10-CM

## 2013-06-17 ENCOUNTER — Ambulatory Visit (HOSPITAL_COMMUNITY)
Admission: RE | Admit: 2013-06-17 | Discharge: 2013-06-17 | Disposition: A | Discharge status: Home / Self Care | Payer: Medicaid Other | Source: Ambulatory Visit | Attending: Urology | Admitting: Urology

## 2013-06-17 DIAGNOSIS — K5730 Diverticulosis of large intestine without perforation or abscess without bleeding: Secondary | ICD-10-CM | POA: Insufficient documentation

## 2013-06-17 DIAGNOSIS — C61 Malignant neoplasm of prostate: Secondary | ICD-10-CM | POA: Insufficient documentation

## 2013-06-17 MED ORDER — GADOBENATE DIMEGLUMINE 529 MG/ML IV SOLN
17.0000 mL | Freq: Once | INTRAVENOUS | Status: AC | PRN
  Administered 2013-06-17: 17 mL via INTRAVENOUS

## 2013-06-20 ENCOUNTER — Ambulatory Visit (HOSPITAL_COMMUNITY)
Admission: RE | Admit: 2013-06-20 | Discharge: 2013-06-20 | Disposition: A | Discharge status: Home / Self Care | Payer: Medicaid Other | Source: Ambulatory Visit | Attending: Urology | Admitting: Urology

## 2013-06-30 ENCOUNTER — Ambulatory Visit (HOSPITAL_COMMUNITY)
Admission: RE | Admit: 2013-06-30 | Discharge: 2013-06-30 | Disposition: A | Discharge status: Home / Self Care | Payer: Medicaid Other | Source: Ambulatory Visit | Attending: Urology | Admitting: Urology

## 2013-06-30 ENCOUNTER — Other Ambulatory Visit (HOSPITAL_COMMUNITY): Payer: Self-pay | Admitting: Urology

## 2013-06-30 DIAGNOSIS — M25561 Pain in right knee: Secondary | ICD-10-CM

## 2013-06-30 DIAGNOSIS — C61 Malignant neoplasm of prostate: Secondary | ICD-10-CM | POA: Insufficient documentation

## 2013-06-30 DIAGNOSIS — S82899A Other fracture of unspecified lower leg, initial encounter for closed fracture: Secondary | ICD-10-CM | POA: Insufficient documentation

## 2013-06-30 DIAGNOSIS — M25569 Pain in unspecified knee: Secondary | ICD-10-CM | POA: Insufficient documentation

## 2013-06-30 DIAGNOSIS — R948 Abnormal results of function studies of other organs and systems: Secondary | ICD-10-CM | POA: Insufficient documentation

## 2013-06-30 DIAGNOSIS — R937 Abnormal findings on diagnostic imaging of other parts of musculoskeletal system: Secondary | ICD-10-CM

## 2013-06-30 DIAGNOSIS — W19XXXA Unspecified fall, initial encounter: Secondary | ICD-10-CM | POA: Insufficient documentation

## 2013-06-30 MED ORDER — GADOBENATE DIMEGLUMINE 529 MG/ML IV SOLN
17.0000 mL | Freq: Once | INTRAVENOUS | Status: AC | PRN
  Administered 2013-06-30: 17 mL via INTRAVENOUS

## 2013-07-10 DIAGNOSIS — I82409 Acute embolism and thrombosis of unspecified deep veins of unspecified lower extremity: Secondary | ICD-10-CM

## 2013-07-10 HISTORY — DX: Acute embolism and thrombosis of unspecified deep veins of unspecified lower extremity: I82.409

## 2013-07-18 ENCOUNTER — Encounter: Payer: Self-pay | Admitting: Radiation Oncology

## 2013-07-18 NOTE — Progress Notes (Signed)
GU Location of Tumor / Histology: prostate  If Prostate Cancer, Gleason Score is (4+ 3) and PSA is (16.76)  Patient presented with elevated PSA  Biopsies of prostate(if applicable) revealed: 50/27/74:  12/12 cores adenocarcinoma  Past/Anticipated interventions by urology, if any: none  Past/Anticipated interventions by medical oncology, if any: none  Weight changes, if any: gained 8 lbs over 2 months  Bowel/Bladder complaints, if any: occasional diarrhea, urinary frequency, gets up 4 times a night to urinate   Nausea/Vomiting, if any: no  Pain issues, if any:  Pain in right ankle from past fracture and surger.  Takes 1 percocet every other day.  SAFETY ISSUES:  Prior radiation? no  Pacemaker/ICD? no  Possible current pregnancy?  NA  Is the patient on methotrexate? no  Current Complaints / other details:  Mr. Huitron is here with his two sisters.  He has one daughter.

## 2013-07-24 ENCOUNTER — Encounter: Payer: Self-pay | Admitting: Radiation Oncology

## 2013-07-24 ENCOUNTER — Ambulatory Visit
Admission: RE | Admit: 2013-07-24 | Discharge: 2013-07-24 | Disposition: A | Discharge status: Home / Self Care | Payer: Medicaid Other | Source: Ambulatory Visit | Attending: Radiation Oncology | Admitting: Radiation Oncology

## 2013-07-24 VITALS — BP 114/74 | HR 76 | Temp 98.1°F | Ht 70.0 in | Wt 184.7 lb

## 2013-07-24 DIAGNOSIS — C61 Malignant neoplasm of prostate: Secondary | ICD-10-CM

## 2013-07-24 DIAGNOSIS — Z79899 Other long term (current) drug therapy: Secondary | ICD-10-CM | POA: Insufficient documentation

## 2013-07-24 DIAGNOSIS — N402 Nodular prostate without lower urinary tract symptoms: Secondary | ICD-10-CM | POA: Insufficient documentation

## 2013-07-24 DIAGNOSIS — N529 Male erectile dysfunction, unspecified: Secondary | ICD-10-CM | POA: Insufficient documentation

## 2013-07-24 DIAGNOSIS — I10 Essential (primary) hypertension: Secondary | ICD-10-CM | POA: Insufficient documentation

## 2013-07-24 DIAGNOSIS — Z800 Family history of malignant neoplasm of digestive organs: Secondary | ICD-10-CM | POA: Insufficient documentation

## 2013-07-24 DIAGNOSIS — Z87891 Personal history of nicotine dependence: Secondary | ICD-10-CM | POA: Insufficient documentation

## 2013-07-24 DIAGNOSIS — Z801 Family history of malignant neoplasm of trachea, bronchus and lung: Secondary | ICD-10-CM | POA: Insufficient documentation

## 2013-07-24 NOTE — Progress Notes (Signed)
Please see the Nurse Progress Note in the MD Initial Consult Encounter for this patient. 

## 2013-07-24 NOTE — Progress Notes (Addendum)
[place]  Radiation Oncology NEW PATIENT EVALUATION  Name: Tanner [name] MRN: [id number]  Date:   07/24/2013           DOB: 03/08/1956  Status: outpatient   CC: Dr. 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 male who is seen today through the courtesy of Dr. 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 He was noted to have a nodular prostate, by his primary care physician, Dr. Zada Girt, with a PSA  elevated at 10.84 on 02/28/2013. After a course of antibiotics, his PSA was repeated 04/14/2013 and it was elevated at 16.70. He was referred to Dr. Rolan Bucco for further evaluation. He noted an indurated right lobe and apex but no extra prostatic tumor extension. He underwent ultrasound-guided biopsies on 05/22/2013 was found have extensive Gleason 7 adenocarcinoma in all biopsies with percent core involvement ranging from 60-100%. Four biopsies were Gleason 7 (4+3) and 8 biopsies were Gleason 7 (3+4).  There was noted to be perineural invasion and also extraprostatic extension. His gland volume was approximately 28 cc. His staging workup including MRI scan on 06/17/2013 which showed diffuse abnormal signal but no evidence of trans-capsular spread or neurovascular bundle involvement. There was no evidence for bone metastases or lymphadenopathy. A 0.7 cm left external iliac lymph node was noted.  A bone scan showed increased uptake at T8 and also right knee, but a MRI scan of T8 was without evidence for metastatic disease and plain films of the right knee were also without evidence for metastatic disease. He was offered robotic prostatectomy recognizing that he would probably need postoperative radiation therapy. The patient tells me that he would like to avoid surgery if possible. He is doing  reasonably well from a GU and GI standpoint. His I PSS score is 9. He does have erectile dysfunction.  PREVIOUS RADIATION THERAPY: No   PAST MEDICAL HISTORY:  has a past medical history of Alcoholic hepatitis; Hypertension; and Prostate cancer (05/22/13).     PAST SURGICAL HISTORY:  Past Surgical History  Procedure Laterality Date  . Hand surgery Right   . Hernia repair    . Orif ankle fracture Right 12/10/2012    Procedure: OPEN REDUCTION INTERNAL FIXATION (ORIF) ANKLE FRACTURE;  Surgeon: Hessie Dibble, MD;  Location: [facility];  Service: Orthopedics;  Laterality: Right;     FAMILY HISTORY: family history includes Lung cancer in his father; Pancreatic cancer in his sister. His father died from complications of COPD at age 75. His mother died following a hip fracture and 82. No family history prostate cancer.   SOCIAL HISTORY:  reports that he quit smoking about 7 months ago. He does not have any smokeless tobacco history on file. He reports that he drinks about 9.0 ounces of alcohol per week. He reports that he does not use illicit drugs. Divorced, one child. He is on disability from orthopedic issues. He previously worked as a Administrator.   ALLERGIES: Procaine hcl and Sulfa antibiotics   MEDICATIONS:  Current Outpatient Prescriptions  Medication Sig Dispense Refill  . clonazePAM (KLONOPIN) 0.5 MG tablet Take 0.5 mg by mouth 4 (four) times daily.      Marland Kitchen doxazosin (CARDURA) 8 MG tablet Take 4 mg by mouth daily.       . furosemide (  LASIX) 40 MG tablet Take 20 mg by mouth daily.      Marland Kitchen lisinopril (PRINIVIL,ZESTRIL) 40 MG tablet Take 40 mg by mouth daily.      . metoprolol (LOPRESSOR) 50 MG tablet Take 50 mg by mouth 2 (two) times daily.      Marland Kitchen omeprazole (PRILOSEC) 40 MG capsule Take 40 mg by mouth 2 (two) times daily.       Marland Kitchen oxyCODONE-acetaminophen (ROXICET) 5-325 MG per tablet Take 1-2 tablets by mouth every 4 (four) hours as needed for pain.  60 tablet  0  . aspirin EC 325 MG  tablet Take 1 tablet (325 mg total) by mouth 2 (two) times daily.  30 tablet  0   No current facility-administered medications for this encounter.     REVIEW OF SYSTEMS:  Pertinent items are noted in HPI.    PHYSICAL EXAM:  height is 5\' 10"  (1.778 m) and weight is 184 lb 11.2 oz (83.779 kg). His temperature is 98.1 F (36.7 C). His blood pressure is 114/74 and his pulse is 76.   Head and neck examination: Grossly unremarkable. Nodes: Without palpable cervical or supraclavicular lymphadenopathy. Chest: Lungs clear. Back: Without spinal or CVA tenderness. Heart: Regular in rhythm. Abdomen: Without masses organomegaly. Genitalia: Grossly unremarkable to inspection. Rectal: The prostate is normal size it is markedly indurated along the right lobe and apex, extending across the midline to the left. There is no definite periprostatic tumor extension or seminal vesicle involvement. Extremities: Without edema.   LABORATORY DATA:  Lab Results  Component Value Date   WBC 5.2 12/12/2012   HGB 10.8* 12/12/2012   HCT 32.7* 12/12/2012   MCV 102.2* 12/12/2012   PLT 112* 12/12/2012   Lab Results  Component Value Date   NA 126* 12/12/2012   K 4.1 12/12/2012   CL 93* 12/12/2012   CO2 28 12/12/2012   Lab Results  Component Value Date   ALT 46 12/10/2012   AST 44* 12/10/2012   ALKPHOS 61 12/10/2012   BILITOT 0.3 12/10/2012   PSA 16.78 from 04/14/2013.   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 I explained to the patient and his 2 sisters that his prognosis is related to his stage, PSA level, and Gleason score. His stage, PSA level, and Gleason score are all of intermediate favorability placing him in the high-risk disease category. We discussed surgery versus radiation therapy. Radiation therapy options include 5 weeks of external beam radiation therapy followed by seed implantation or 8 weeks of external beam/IMRT along with androgen deprivation therapy for 2 years. My major concern for cure with  radiation therapy is his disease volume, and it does make some sense, in a young patient, to consider surgery knowing that he will probably require postoperative radiation therapy. There are no prospective randomized trials comparing combined surgery followed by post operative radiation therapy compared to radiation therapy alone. We discussed the potential acute and late toxicities of radiation therapy. He'll visit Dr. Jasmine December for a followup visit next Friday and decide on his course of therapy.   PLAN: As discussed above.  I spent 60 minutes minutes face to face with the patient and more than 50% of that time was spent in counseling and/or coordination of care.

## 2013-08-04 ENCOUNTER — Other Ambulatory Visit: Payer: Self-pay | Admitting: Urology

## 2013-08-04 ENCOUNTER — Encounter: Payer: Self-pay | Admitting: Radiation Oncology

## 2013-08-04 NOTE — Progress Notes (Signed)
The patient left a message to tell me that he plans to proceed with surgery with Dr. Jasmine December.

## 2013-08-10 DIAGNOSIS — G459 Transient cerebral ischemic attack, unspecified: Secondary | ICD-10-CM

## 2013-08-10 HISTORY — DX: Transient cerebral ischemic attack, unspecified: G45.9

## 2013-08-20 ENCOUNTER — Encounter (HOSPITAL_COMMUNITY): Payer: Self-pay | Admitting: Pharmacy Technician

## 2013-08-22 ENCOUNTER — Other Ambulatory Visit (HOSPITAL_COMMUNITY): Payer: Self-pay | Admitting: Urology

## 2013-08-22 NOTE — Patient Instructions (Signed)
      Your procedure is scheduled on:  08/29/13  FRIDAY  Report to [place] at  0900     AM.  Call this number if you have problems the morning of surgery: [phone]      BOWEL PREP AS PER OFFICE  Do not take ANYTHING BY MOUTH After Midnight. Thursday NIGHT   Take these medicines the morning of surgery with A SIP OF WATER: METOPROLOL, Clonazepam, Doxasin, Omeprazole May take Percocet if needed   .  Contacts, dentures or partial plates, or metal hairpins  can not be worn to surgery. Your family will be responsible for glasses, dentures, hearing aides while you are in surgery  Leave suitcase in the car. After surgery it may be brought to your room.  For patients admitted to the hospital, checkout time is 11:00 AM day of  discharge.                DO NOT WEAR JEWELRY, LOTIONS, POWDERS, OR PERFUMES.  WOMEN-- DO NOT SHAVE LEGS OR UNDERARMS FOR 48 HOURS BEFORE SHOWERS. MEN MAY SHAVE FACE.  Patients discharged the day of surgery will not be allowed to drive home. IF going home the day of surgery, you must have a driver and someone to stay with you for the first 24 hours  Name and phone number of your driver:   admission                                                                     Please read over the following fact sheets that you were given: Blood Transfusion Sheet  Information                     [place]                                                  Patient Signature _____________________________

## 2013-08-22 NOTE — Progress Notes (Signed)
[place] Psychosocial Distress Screening Clinical Social Work  Clinical Social Work was referred by distress screening protocol.  The patient scored a 5 on the Psychosocial Distress Thermometer which indicates moderate distress. Clinical Social Worker Intern telephoned to assess for distress and other psychosocial needs. Patient stated everything was fine.  Patient was informed about services and agrees to seek out further assistance if needed.   Clinical Social Worker follow up needed: no  If yes, follow up plan:   Jartavious Mckimmy S. [place] Work Intern [employer] [phone]

## 2013-08-22 NOTE — Progress Notes (Signed)
1 view chest, EKG 6/14 EPIC

## 2013-08-25 ENCOUNTER — Ambulatory Visit: Payer: Medicaid Other | Admitting: Physical Therapy

## 2013-08-25 ENCOUNTER — Ambulatory Visit (HOSPITAL_COMMUNITY)
Admission: RE | Admit: 2013-08-25 | Discharge: 2013-08-25 | Disposition: A | Discharge status: Home / Self Care | Payer: Medicaid Other | Source: Ambulatory Visit | Attending: Urology | Admitting: Urology

## 2013-08-25 ENCOUNTER — Encounter (HOSPITAL_COMMUNITY)
Admission: RE | Admit: 2013-08-25 | Discharge: 2013-08-25 | Disposition: A | Discharge status: Home / Self Care | Payer: Medicaid Other | Source: Ambulatory Visit | Attending: Urology | Admitting: Urology

## 2013-08-25 ENCOUNTER — Encounter (HOSPITAL_COMMUNITY): Payer: Self-pay

## 2013-08-25 DIAGNOSIS — Z01812 Encounter for preprocedural laboratory examination: Secondary | ICD-10-CM | POA: Insufficient documentation

## 2013-08-25 DIAGNOSIS — Z01818 Encounter for other preprocedural examination: Secondary | ICD-10-CM | POA: Insufficient documentation

## 2013-08-25 HISTORY — DX: Anxiety disorder, unspecified: F41.9

## 2013-08-25 HISTORY — DX: Shortness of breath: R06.02

## 2013-08-25 HISTORY — DX: Gastro-esophageal reflux disease without esophagitis: K21.9

## 2013-08-25 LAB — COMPREHENSIVE METABOLIC PANEL
ALK PHOS: 54 U/L (ref 39–117)
ALT: 13 U/L (ref 0–53)
AST: 17 U/L (ref 0–37)
Albumin: 3.6 g/dL (ref 3.5–5.2)
BUN: 20 mg/dL (ref 6–23)
CO2: 27 mEq/L (ref 19–32)
Calcium: 9.3 mg/dL (ref 8.4–10.5)
Chloride: 97 mEq/L (ref 96–112)
Creatinine, Ser: 1.46 mg/dL — ABNORMAL HIGH (ref 0.50–1.35)
GFR calc non Af Amer: 52 mL/min — ABNORMAL LOW (ref >90)
GFR, EST AFRICAN AMERICAN: 61 mL/min — AB (ref >90)
GLUCOSE: 96 mg/dL (ref 70–99)
POTASSIUM: 5.1 meq/L (ref 3.7–5.3)
Sodium: 135 mEq/L — ABNORMAL LOW (ref 137–147)
Total Bilirubin: 0.2 mg/dL — ABNORMAL LOW (ref 0.3–1.2)
Total Protein: 7.3 g/dL (ref 6.0–8.3)

## 2013-08-25 LAB — CBC
HCT: 38.7 % — ABNORMAL LOW (ref 39.0–52.0)
HEMOGLOBIN: 13.1 g/dL (ref 13.0–17.0)
MCH: 33.2 pg (ref 26.0–34.0)
MCHC: 33.9 g/dL (ref 30.0–36.0)
MCV: 98 fL (ref 78.0–100.0)
PLATELETS: 178 10*3/uL (ref 150–400)
RBC: 3.95 MIL/uL — AB (ref 4.22–5.81)
RDW: 12.7 % (ref 11.5–15.5)
WBC: 4.9 10*3/uL (ref 4.0–10.5)

## 2013-08-25 LAB — ABO/RH: ABO/RH(D): A POS

## 2013-08-25 NOTE — Progress Notes (Signed)
08/25/13 0920  OBSTRUCTIVE SLEEP APNEA  Have you ever been diagnosed with sleep apnea through a sleep study? No  Do you snore loudly (loud enough to be heard through closed doors)?  1  Do you often feel tired, fatigued, or sleepy during the daytime? 0  Has anyone observed you stop breathing during your sleep? 0  Do you have, or are you being treated for high blood pressure? 1  BMI more than 35 kg/m2? 0  Age over 56 years old? 1  Neck circumference greater than 40 cm/18 inches? 0  Gender: 1  Obstructive Sleep Apnea Score 4  Score 4 or greater  Results sent to PCP

## 2013-08-25 NOTE — Progress Notes (Signed)
PST visit-  Instructed patient no alcohol x 48 hrs pre op and no marijuana usage- instructed does not mix with anesthesia drugs-  Verbalizes understanding

## 2013-08-29 ENCOUNTER — Encounter (HOSPITAL_COMMUNITY)
Admission: RE | Disposition: A | Discharge status: Rehabilitation Facility | Payer: Self-pay | Source: Ambulatory Visit | Attending: Urology

## 2013-08-29 ENCOUNTER — Inpatient Hospital Stay (HOSPITAL_COMMUNITY)
Admission: RE | Admit: 2013-08-29 | Discharge: 2013-09-05 | DRG: 707 | Disposition: A | Discharge status: Rehabilitation Facility | Payer: Medicaid Other | Source: Ambulatory Visit | Attending: Urology | Admitting: Urology

## 2013-08-29 ENCOUNTER — Encounter (HOSPITAL_COMMUNITY): Payer: Self-pay | Admitting: *Deleted

## 2013-08-29 ENCOUNTER — Encounter (HOSPITAL_COMMUNITY): Payer: Medicaid Other | Admitting: Certified Registered"

## 2013-08-29 ENCOUNTER — Inpatient Hospital Stay (HOSPITAL_COMMUNITY): Payer: Medicaid Other | Admitting: Certified Registered"

## 2013-08-29 DIAGNOSIS — Z87891 Personal history of nicotine dependence: Secondary | ICD-10-CM

## 2013-08-29 DIAGNOSIS — I10 Essential (primary) hypertension: Secondary | ICD-10-CM | POA: Diagnosis present

## 2013-08-29 DIAGNOSIS — D649 Anemia, unspecified: Secondary | ICD-10-CM | POA: Diagnosis present

## 2013-08-29 DIAGNOSIS — F411 Generalized anxiety disorder: Secondary | ICD-10-CM | POA: Diagnosis present

## 2013-08-29 DIAGNOSIS — R270 Ataxia, unspecified: Secondary | ICD-10-CM

## 2013-08-29 DIAGNOSIS — K560 Paralytic ileus: Secondary | ICD-10-CM | POA: Diagnosis not present

## 2013-08-29 DIAGNOSIS — I639 Cerebral infarction, unspecified: Secondary | ICD-10-CM

## 2013-08-29 DIAGNOSIS — R5381 Other malaise: Secondary | ICD-10-CM | POA: Diagnosis not present

## 2013-08-29 DIAGNOSIS — R112 Nausea with vomiting, unspecified: Secondary | ICD-10-CM | POA: Diagnosis not present

## 2013-08-29 DIAGNOSIS — R5383 Other fatigue: Secondary | ICD-10-CM

## 2013-08-29 DIAGNOSIS — G8190 Hemiplegia, unspecified affecting unspecified side: Secondary | ICD-10-CM | POA: Diagnosis not present

## 2013-08-29 DIAGNOSIS — Z801 Family history of malignant neoplasm of trachea, bronchus and lung: Secondary | ICD-10-CM

## 2013-08-29 DIAGNOSIS — Z800 Family history of malignant neoplasm of digestive organs: Secondary | ICD-10-CM

## 2013-08-29 DIAGNOSIS — I6350 Cerebral infarction due to unspecified occlusion or stenosis of unspecified cerebral artery: Secondary | ICD-10-CM | POA: Diagnosis not present

## 2013-08-29 DIAGNOSIS — K219 Gastro-esophageal reflux disease without esophagitis: Secondary | ICD-10-CM | POA: Diagnosis present

## 2013-08-29 DIAGNOSIS — C61 Malignant neoplasm of prostate: Principal | ICD-10-CM | POA: Diagnosis present

## 2013-08-29 DIAGNOSIS — R110 Nausea: Secondary | ICD-10-CM

## 2013-08-29 DIAGNOSIS — N529 Male erectile dysfunction, unspecified: Secondary | ICD-10-CM | POA: Diagnosis present

## 2013-08-29 DIAGNOSIS — K660 Peritoneal adhesions (postprocedural) (postinfection): Secondary | ICD-10-CM | POA: Diagnosis present

## 2013-08-29 HISTORY — PX: LYMPHADENECTOMY: SHX5960

## 2013-08-29 HISTORY — PX: ROBOT ASSISTED LAPAROSCOPIC RADICAL PROSTATECTOMY: SHX5141

## 2013-08-29 LAB — TYPE AND SCREEN
ABO/RH(D): A POS
ANTIBODY SCREEN: NEGATIVE

## 2013-08-29 LAB — HEMOGLOBIN AND HEMATOCRIT, BLOOD
HCT: 34 % — ABNORMAL LOW (ref 39.0–52.0)
Hemoglobin: 11.7 g/dL — ABNORMAL LOW (ref 13.0–17.0)

## 2013-08-29 SURGERY — ROBOTIC ASSISTED LAPAROSCOPIC RADICAL PROSTATECTOMY LEVEL 2
Anesthesia: General | Site: Pelvis

## 2013-08-29 MED ORDER — SODIUM CHLORIDE 0.9 % IV SOLN
INTRAVENOUS | Status: DC
Start: 2013-08-29 — End: 2013-08-31
  Administered 2013-08-29 – 2013-08-31 (×4): via INTRAVENOUS

## 2013-08-29 MED ORDER — BISACODYL 10 MG RE SUPP
10.0000 mg | Freq: Two times a day (BID) | RECTAL | Status: DC
  Administered 2013-08-30 – 2013-08-31 (×2): 10 mg via RECTAL
  Filled 2013-08-29 (×7): qty 1

## 2013-08-29 MED ORDER — SUCCINYLCHOLINE CHLORIDE 20 MG/ML IJ SOLN
INTRAMUSCULAR | Status: DC | PRN
  Administered 2013-08-29: 100 mg via INTRAVENOUS

## 2013-08-29 MED ORDER — GLYCOPYRROLATE 0.2 MG/ML IJ SOLN
INTRAMUSCULAR | Status: DC | PRN
  Administered 2013-08-29: 0.6 mg via INTRAVENOUS

## 2013-08-29 MED ORDER — LORAZEPAM 1 MG PO TABS: 1.0000 mg | ORAL_TABLET | Freq: Four times a day (QID) | ORAL | Status: AC | PRN

## 2013-08-29 MED ORDER — OXYBUTYNIN CHLORIDE 5 MG PO TABS: 5.0000 mg | ORAL_TABLET | Freq: Four times a day (QID) | ORAL | Status: DC | PRN

## 2013-08-29 MED ORDER — MORPHINE SULFATE 10 MG/ML IJ SOLN
INTRAMUSCULAR | Status: DC | PRN
  Administered 2013-08-29 (×4): 1 mg via INTRAVENOUS
  Administered 2013-08-29: 4 mg via INTRAVENOUS

## 2013-08-29 MED ORDER — FENTANYL CITRATE 0.05 MG/ML IJ SOLN
25.0000 ug | INTRAMUSCULAR | Status: DC | PRN
  Administered 2013-08-29 (×4): 25 ug via INTRAVENOUS

## 2013-08-29 MED ORDER — FOLIC ACID 1 MG PO TABS
1.0000 mg | ORAL_TABLET | Freq: Every day | ORAL | Status: DC
  Administered 2013-08-29 – 2013-09-05 (×8): 1 mg via ORAL
  Filled 2013-08-29 (×8): qty 1

## 2013-08-29 MED ORDER — BACITRACIN-NEOMYCIN-POLYMYXIN 400-5-5000 EX OINT: 1.0000 "application " | TOPICAL_OINTMENT | Freq: Three times a day (TID) | CUTANEOUS | Status: DC | PRN

## 2013-08-29 MED ORDER — PROPOFOL 10 MG/ML IV BOLUS
INTRAVENOUS | Status: DC | PRN
  Administered 2013-08-29: 150 mg via INTRAVENOUS

## 2013-08-29 MED ORDER — SUCCINYLCHOLINE CHLORIDE 20 MG/ML IJ SOLN
INTRAMUSCULAR | Status: AC
  Filled 2013-08-29: qty 1

## 2013-08-29 MED ORDER — HYDROMORPHONE HCL PF 1 MG/ML IJ SOLN: 0.2500 mg | INTRAMUSCULAR | Status: DC | PRN

## 2013-08-29 MED ORDER — SODIUM CHLORIDE 0.9 % IV BOLUS (SEPSIS): 1000.0000 mL | Freq: Once | INTRAVENOUS | Status: DC

## 2013-08-29 MED ORDER — ROCURONIUM BROMIDE 100 MG/10ML IV SOLN
INTRAVENOUS | Status: AC
  Filled 2013-08-29: qty 1

## 2013-08-29 MED ORDER — HEPARIN SODIUM (PORCINE) 1000 UNIT/ML IJ SOLN
INTRAMUSCULAR | Status: AC
  Filled 2013-08-29: qty 1

## 2013-08-29 MED ORDER — HEPARIN SODIUM (PORCINE) 5000 UNIT/ML IJ SOLN
5000.0000 [IU] | INTRAMUSCULAR | Status: AC
  Administered 2013-08-29: 5000 [IU] via SUBCUTANEOUS
  Filled 2013-08-29: qty 1

## 2013-08-29 MED ORDER — BUPIVACAINE-EPINEPHRINE 0.25% -1:200000 IJ SOLN
INTRAMUSCULAR | Status: DC | PRN
  Administered 2013-08-29: 30 mL

## 2013-08-29 MED ORDER — VITAMIN B-1 100 MG PO TABS
100.0000 mg | ORAL_TABLET | Freq: Every day | ORAL | Status: DC
  Administered 2013-08-29 – 2013-09-05 (×8): 100 mg via ORAL
  Filled 2013-08-29 (×8): qty 1

## 2013-08-29 MED ORDER — PROPOFOL 10 MG/ML IV BOLUS
INTRAVENOUS | Status: AC
  Filled 2013-08-29: qty 20

## 2013-08-29 MED ORDER — LORAZEPAM 2 MG/ML IJ SOLN
1.0000 mg | Freq: Four times a day (QID) | INTRAMUSCULAR | Status: AC | PRN
  Administered 2013-09-01: 1 mg via INTRAVENOUS
  Filled 2013-08-29: qty 1

## 2013-08-29 MED ORDER — ONDANSETRON HCL 4 MG/2ML IJ SOLN
INTRAMUSCULAR | Status: AC
  Filled 2013-08-29: qty 2

## 2013-08-29 MED ORDER — KETAMINE HCL 10 MG/ML IJ SOLN
INTRAMUSCULAR | Status: DC | PRN
  Administered 2013-08-29: 25 mg via INTRAVENOUS

## 2013-08-29 MED ORDER — SENNOSIDES-DOCUSATE SODIUM 8.6-50 MG PO TABS
1.0000 | ORAL_TABLET | Freq: Two times a day (BID) | ORAL | Status: DC
  Administered 2013-08-29 – 2013-09-05 (×12): 1 via ORAL
  Filled 2013-08-29 (×15): qty 1

## 2013-08-29 MED ORDER — CEFAZOLIN SODIUM-DEXTROSE 2-3 GM-% IV SOLR
2.0000 g | Freq: Three times a day (TID) | INTRAVENOUS | Status: AC
  Administered 2013-08-29 – 2013-08-30 (×2): 2 g via INTRAVENOUS
  Filled 2013-08-29 (×2): qty 50

## 2013-08-29 MED ORDER — ADULT MULTIVITAMIN W/MINERALS CH
1.0000 | ORAL_TABLET | Freq: Every day | ORAL | Status: DC
  Administered 2013-08-29 – 2013-09-04 (×7): 1 via ORAL
  Filled 2013-08-29 (×8): qty 1

## 2013-08-29 MED ORDER — LACTATED RINGERS IR SOLN
Status: DC | PRN
  Administered 2013-08-29: 1000 mL

## 2013-08-29 MED ORDER — KETAMINE HCL 10 MG/ML IJ SOLN
INTRAMUSCULAR | Status: AC
  Filled 2013-08-29: qty 1

## 2013-08-29 MED ORDER — BELLADONNA ALKALOIDS-OPIUM 16.2-60 MG RE SUPP
RECTAL | Status: AC
  Filled 2013-08-29: qty 1

## 2013-08-29 MED ORDER — HYOSCYAMINE SULFATE 0.125 MG PO TABS: 0.1250 mg | ORAL_TABLET | ORAL | Status: DC | PRN

## 2013-08-29 MED ORDER — MIDAZOLAM HCL 5 MG/5ML IJ SOLN
INTRAMUSCULAR | Status: DC | PRN
  Administered 2013-08-29: 2 mg via INTRAVENOUS

## 2013-08-29 MED ORDER — BELLADONNA ALKALOIDS-OPIUM 16.2-60 MG RE SUPP
1.0000 | Freq: Four times a day (QID) | RECTAL | Status: DC | PRN
  Administered 2013-08-29: 1 via RECTAL

## 2013-08-29 MED ORDER — LACTATED RINGERS IV SOLN
INTRAVENOUS | Status: AC
  Administered 2013-08-29: 17:00:00 via INTRAVENOUS
  Administered 2013-08-29: 1000 mL via INTRAVENOUS
  Administered 2013-08-29: 15:00:00 via INTRAVENOUS

## 2013-08-29 MED ORDER — FENTANYL CITRATE 0.05 MG/ML IJ SOLN
INTRAMUSCULAR | Status: DC | PRN
  Administered 2013-08-29 (×2): 25 ug via INTRAVENOUS
  Administered 2013-08-29 (×2): 50 ug via INTRAVENOUS
  Administered 2013-08-29: 100 ug via INTRAVENOUS

## 2013-08-29 MED ORDER — LIDOCAINE HCL (CARDIAC) 20 MG/ML IV SOLN
INTRAVENOUS | Status: AC
  Filled 2013-08-29: qty 5

## 2013-08-29 MED ORDER — EPHEDRINE SULFATE 50 MG/ML IJ SOLN
INTRAMUSCULAR | Status: DC | PRN
  Administered 2013-08-29 (×3): 5 mg via INTRAVENOUS

## 2013-08-29 MED ORDER — LIDOCAINE HCL (PF) 2 % IJ SOLN
INTRAMUSCULAR | Status: DC | PRN
  Administered 2013-08-29: 40 mg via INTRADERMAL

## 2013-08-29 MED ORDER — DEXAMETHASONE SODIUM PHOSPHATE 10 MG/ML IJ SOLN
INTRAMUSCULAR | Status: DC | PRN
  Administered 2013-08-29: 10 mg via INTRAVENOUS

## 2013-08-29 MED ORDER — SENNOSIDES-DOCUSATE SODIUM 8.6-50 MG PO TABS: 1.0000 | ORAL_TABLET | Freq: Two times a day (BID) | ORAL | Status: DC

## 2013-08-29 MED ORDER — BUPIVACAINE-EPINEPHRINE PF 0.25-1:200000 % IJ SOLN
INTRAMUSCULAR | Status: AC
  Filled 2013-08-29: qty 30

## 2013-08-29 MED ORDER — CIPROFLOXACIN HCL 500 MG PO TABS: 500.0000 mg | ORAL_TABLET | Freq: Two times a day (BID) | ORAL | Status: DC

## 2013-08-29 MED ORDER — DEXAMETHASONE SODIUM PHOSPHATE 10 MG/ML IJ SOLN
INTRAMUSCULAR | Status: AC
  Filled 2013-08-29: qty 1

## 2013-08-29 MED ORDER — MIDAZOLAM HCL 2 MG/2ML IJ SOLN
INTRAMUSCULAR | Status: AC
  Filled 2013-08-29: qty 2

## 2013-08-29 MED ORDER — HYDROCODONE-ACETAMINOPHEN 5-325 MG PO TABS: 1.0000 | ORAL_TABLET | ORAL | Status: DC | PRN

## 2013-08-29 MED ORDER — CEFAZOLIN SODIUM-DEXTROSE 2-3 GM-% IV SOLR
INTRAVENOUS | Status: AC
  Filled 2013-08-29: qty 50

## 2013-08-29 MED ORDER — SODIUM CHLORIDE 0.9 % IR SOLN
Status: DC | PRN
  Administered 2013-08-29: 1000 mL

## 2013-08-29 MED ORDER — MORPHINE SULFATE 4 MG/ML IJ SOLN
INTRAMUSCULAR | Status: AC
  Filled 2013-08-29: qty 2

## 2013-08-29 MED ORDER — HYOSCYAMINE SULFATE 0.125 MG SL SUBL
0.1250 mg | SUBLINGUAL_TABLET | SUBLINGUAL | Status: DC | PRN
  Filled 2013-08-29: qty 1

## 2013-08-29 MED ORDER — ONDANSETRON HCL 4 MG/2ML IJ SOLN
INTRAMUSCULAR | Status: DC | PRN
  Administered 2013-08-29 (×2): 2 mg via INTRAVENOUS

## 2013-08-29 MED ORDER — CEFAZOLIN SODIUM-DEXTROSE 2-3 GM-% IV SOLR
2.0000 g | INTRAVENOUS | Status: AC
  Administered 2013-08-29: 2 g via INTRAVENOUS

## 2013-08-29 MED ORDER — HYDROMORPHONE HCL PF 2 MG/ML IJ SOLN
INTRAMUSCULAR | Status: AC
  Filled 2013-08-29: qty 1

## 2013-08-29 MED ORDER — NEOSTIGMINE METHYLSULFATE 1 MG/ML IJ SOLN
INTRAMUSCULAR | Status: DC | PRN
  Administered 2013-08-29: 5 mg via INTRAVENOUS

## 2013-08-29 MED ORDER — OXYBUTYNIN CHLORIDE 5 MG PO TABS
5.0000 mg | ORAL_TABLET | Freq: Four times a day (QID) | ORAL | Status: DC | PRN
  Administered 2013-08-31 – 2013-09-01 (×2): 5 mg via ORAL
  Filled 2013-08-29 (×2): qty 1

## 2013-08-29 MED ORDER — ROCURONIUM BROMIDE 100 MG/10ML IV SOLN
INTRAVENOUS | Status: DC | PRN
  Administered 2013-08-29: 20 mg via INTRAVENOUS
  Administered 2013-08-29 (×3): 5 mg via INTRAVENOUS
  Administered 2013-08-29: 10 mg via INTRAVENOUS
  Administered 2013-08-29: 15 mg via INTRAVENOUS
  Administered 2013-08-29: 10 mg via INTRAVENOUS
  Administered 2013-08-29: 20 mg via INTRAVENOUS
  Administered 2013-08-29: 45 mg via INTRAVENOUS

## 2013-08-29 MED ORDER — FENTANYL CITRATE 0.05 MG/ML IJ SOLN
INTRAMUSCULAR | Status: AC
  Filled 2013-08-29: qty 5

## 2013-08-29 MED ORDER — THIAMINE HCL 100 MG/ML IJ SOLN
100.0000 mg | Freq: Every day | INTRAMUSCULAR | Status: DC
  Filled 2013-08-29 (×8): qty 1

## 2013-08-29 MED ORDER — ONDANSETRON HCL 4 MG/2ML IJ SOLN
4.0000 mg | INTRAMUSCULAR | Status: DC | PRN
  Administered 2013-08-30 – 2013-09-05 (×27): 4 mg via INTRAVENOUS
  Filled 2013-08-29 (×27): qty 2

## 2013-08-29 MED ORDER — FENTANYL CITRATE 0.05 MG/ML IJ SOLN
INTRAMUSCULAR | Status: AC
  Filled 2013-08-29: qty 2

## 2013-08-29 MED ORDER — PROMETHAZINE HCL 25 MG/ML IJ SOLN: 6.2500 mg | INTRAMUSCULAR | Status: DC | PRN

## 2013-08-29 MED ORDER — HYDROCODONE-ACETAMINOPHEN 5-325 MG PO TABS
1.0000 | ORAL_TABLET | ORAL | Status: DC | PRN
  Administered 2013-08-30: 2 via ORAL
  Administered 2013-08-30 (×3): 1 via ORAL
  Administered 2013-08-31 (×4): 2 via ORAL
  Filled 2013-08-29: qty 1
  Filled 2013-08-29: qty 2
  Filled 2013-08-29 (×2): qty 1
  Filled 2013-08-29 (×4): qty 2

## 2013-08-29 MED ORDER — MORPHINE SULFATE 2 MG/ML IJ SOLN
2.0000 mg | INTRAMUSCULAR | Status: DC | PRN
  Administered 2013-08-30 (×2): 2 mg via INTRAVENOUS
  Filled 2013-08-29 (×3): qty 1

## 2013-08-29 SURGICAL SUPPLY — 59 items
APPLIER CLIP 5 13 M/L LIGAMAX5 (MISCELLANEOUS) ×3
CANISTER SUCTION 2500CC (MISCELLANEOUS) IMPLANT
CATH FOLEY 2WAY SLVR 30CC 16FR (CATHETERS) IMPLANT
CATH ROBINSON RED A/P 16FR (CATHETERS) ×3 IMPLANT
CATH ROBINSON RED A/P 8FR (CATHETERS) ×3 IMPLANT
CATH TIEMANN FOLEY 18FR 5CC (CATHETERS) ×3 IMPLANT
CATH URET 5FR 28IN OPEN ENDED (CATHETERS) IMPLANT
CHLORAPREP W/TINT 26ML (MISCELLANEOUS) ×3 IMPLANT
CLIP APPLIE 5 13 M/L LIGAMAX5 (MISCELLANEOUS) ×2 IMPLANT
CLIP LIGATING HEM O LOK PURPLE (MISCELLANEOUS) ×12 IMPLANT
CLOTH BEACON ORANGE TIMEOUT ST (SAFETY) ×3 IMPLANT
CORD HIGH FREQUENCY UNIPOLAR (ELECTROSURGICAL) ×3 IMPLANT
COVER SURGICAL LIGHT HANDLE (MISCELLANEOUS) ×3 IMPLANT
COVER TIP SHEARS 8 DVNC (MISCELLANEOUS) ×2 IMPLANT
COVER TIP SHEARS 8MM DA VINCI (MISCELLANEOUS) ×1
CUTTER ECHEON FLEX ENDO 45 340 (ENDOMECHANICALS) ×3 IMPLANT
DECANTER SPIKE VIAL GLASS SM (MISCELLANEOUS) IMPLANT
DRAPE SURG IRRIG POUCH 19X23 (DRAPES) ×3 IMPLANT
DRSG TEGADERM 2-3/8X2-3/4 SM (GAUZE/BANDAGES/DRESSINGS) ×12 IMPLANT
DRSG TEGADERM 4X4.75 (GAUZE/BANDAGES/DRESSINGS) ×6 IMPLANT
DRSG TEGADERM 6X8 (GAUZE/BANDAGES/DRESSINGS) ×6 IMPLANT
ELECT REM PT RETURN 9FT ADLT (ELECTROSURGICAL) ×3
ELECTRODE REM PT RTRN 9FT ADLT (ELECTROSURGICAL) ×2 IMPLANT
GAUZE SPONGE 2X2 8PLY STRL LF (GAUZE/BANDAGES/DRESSINGS) ×2 IMPLANT
GLOVE BIO SURGEON STRL SZ7.5 (GLOVE) ×3 IMPLANT
GLOVE BIOGEL M 7.0 STRL (GLOVE) IMPLANT
GLOVE BIOGEL PI IND STRL 7.0 (GLOVE) ×2 IMPLANT
GLOVE BIOGEL PI IND STRL 7.5 (GLOVE) ×4 IMPLANT
GLOVE BIOGEL PI INDICATOR 7.0 (GLOVE) ×1
GLOVE BIOGEL PI INDICATOR 7.5 (GLOVE) ×2
GLOVE ECLIPSE 7.0 STRL STRAW (GLOVE) ×3 IMPLANT
GLOVE SURG SS PI 6.5 STRL IVOR (GLOVE) ×3 IMPLANT
GLOVE SURG SS PI 7.5 STRL IVOR (GLOVE) ×3 IMPLANT
GOWN STRL REUS W/TWL LRG LVL3 (GOWN DISPOSABLE) ×6 IMPLANT
GOWN STRL REUS W/TWL XL LVL3 (GOWN DISPOSABLE) ×9 IMPLANT
HEMOSTAT SURGICEL 4X8 (HEMOSTASIS) ×3 IMPLANT
HOLDER FOLEY CATH W/STRAP (MISCELLANEOUS) ×3 IMPLANT
IV LACTATED RINGERS 1000ML (IV SOLUTION) IMPLANT
KIT ACCESSORY DA VINCI DISP (KITS) ×1
KIT ACCESSORY DVNC DISP (KITS) ×2 IMPLANT
NDL SAFETY ECLIPSE 18X1.5 (NEEDLE) ×2 IMPLANT
NEEDLE HYPO 18GX1.5 SHARP (NEEDLE) ×1
PACK ROBOT UROLOGY CUSTOM (CUSTOM PROCEDURE TRAY) ×3 IMPLANT
RELOAD GREEN ECHELON 45 (STAPLE) ×3 IMPLANT
SCRUB PCMX 4 OZ (MISCELLANEOUS) IMPLANT
SET TUBE IRRIG SUCTION NO TIP (IRRIGATION / IRRIGATOR) ×3 IMPLANT
SOLUTION ELECTROLUBE (MISCELLANEOUS) ×3 IMPLANT
SPONGE GAUZE 2X2 STER 10/PKG (GAUZE/BANDAGES/DRESSINGS) ×1
SUT ETHILON 3 0 PS 1 (SUTURE) ×3 IMPLANT
SUT VIC AB 2-0 SH 27 (SUTURE) ×1
SUT VIC AB 2-0 SH 27X BRD (SUTURE) ×2 IMPLANT
SUT VICRYL 0 UR6 27IN ABS (SUTURE) ×12 IMPLANT
SUT VLOC BARB 180 ABS3/0GR12 (SUTURE) ×9
SUTURE VLOC BRB 180 ABS3/0GR12 (SUTURE) ×6 IMPLANT
SYR 27GX1/2 1ML LL SAFETY (SYRINGE) ×3 IMPLANT
TOWEL OR 17X26 10 PK STRL BLUE (TOWEL DISPOSABLE) ×3 IMPLANT
TOWEL OR NON WOVEN STRL DISP B (DISPOSABLE) ×3 IMPLANT
TROCAR 12M 150ML BLUNT (TROCAR) IMPLANT
WATER STERILE IRR 1500ML POUR (IV SOLUTION) ×3 IMPLANT

## 2013-08-29 NOTE — Op Note (Signed)
DATE OF PROCEDURE: 08/29/13   OPERATIVE REPORT  SURGEON: Rolan Bucco, MD  ASSISTANT:  Leta Baptist, PA   PREOPERATIVE DIAGNOSIS: Prostate cancer.  POSTOPERATIVE DIAGNOSIS: Prostate cancer.   PROCEDURE PERFORMED:  Robotic-assisted laparoscopic radical prostatectomy Bilateral non-nerve sparing approach.  Bilateral pelvic lymph node dissection.  ESTIMATED BLOOD LOSS: 200 cc.  DRAINS: Jackson-Pratt drain, left lower quadrant and Foley catheter.   SPECIMEN: Prostate sent with seminal vesicles in its entirety. Bilateral pelvic lymph node packets. Anterior resection margin. Posterior bladder neck margin (frozen- negative for prostate tissue/malignancy)  FINDINGS: Desmoplastic reaction surrounding the seminal vesicles bilaterally and the apex bilaterally.    HISTORY OF PRESENT ILLNESS: 58 yo M found to have prostate cancer (4+3=7 or 3+4=7 in all 12 biopsies). Staging work up was negative for metastatic disease. After evaluation and discussion of management options, he elected for surgical extirpation of his prostate.   PROCEDURE IN DETAIL: Informed consent was obtained. He received subcutaneous heparin for DVT prophylaxis before being taken to the OR. The patient was taken to the operating room, where he was placed in the supine position. IV antibiotics were infused and general anesthesia was induced. A time- out was performed, in which the correct patient, surgical site, and procedure were identified and agreed upon by the team. Hair was removed  from his abdomen and genitals and then he was placed in a dorsal  lithotomy position. All pertinent neurovascular pressure points were padded appropriately. His arms were tucked to the side using gel padding. After this, his abdomen and genitals were prepped and draped in the usual sterile fashion. A Foley catheter was placed on the field, 10 cc of sterile water was placed into the balloon.   He was then placed in a Trendelenburg position. Access  was gained for laparoscopic procedure by using the Hasson technique by cutting down to the fascia in the midline above the umbilicus. Once the peritoneum was accessed, a 10 mm  port was placed and abdomen was insufflated with carbon dioxide. Opening pressures were initially low so insufflation was continued. Next, markings were made for placement of the 1st & 3rd robotic arm ports in the usual areas with the ports being placed approximately 10 cm from the midline on either  side of camera (2nd) arm.  A 10 mm assistant port was placed on the far right lateral side of the abdomen. A 5 mm assistant port was placed between the right robotic (1st arm) and the camera port (2nd arm). The 4th robotic arm port was placed at the far left lateral side of the abdomen. All these were placed under direct visualization, and the robot was docked to the robotic ports.  He was noted to have bowel adhesions of his large intestine to the left anterior abdominal wall and pelvic sidewall. I performed a lysis of adhesions using sharp dissection only to successfully reflect the bowel away from the lateral wall of the pelvis and abdomen.  After this was done, the urachal remnant was identified and divided and the bladder was  Dissected from the anterior wall of the abdomen. This was taken down to  the endopelvic fascia bilaterally and the perineum was split down to the  vas deferens bilaterally. The vas deferens was divided bipolar and monopolar electrocautery.    Attention was turned to the right pelvis. The peritoneum was incised until the right external iliac vein was identified. Careful dissection was performed to release the nodal packet lateral to the vein. Dissection was then carried out  inferior and medially to the vein until the lateral pelvic wall was encountered. Gentle dissection was then carried proximally and distally along the vein. The obturator nerve was identified early at its proximal and distal ends.  Dissection of this packet was carried out making sure not to cause injury to the obturator nerve. Hemostasis and lymph vessels were controlled with hemolock clips. The lymph node packet was then removed and sent for permanent pathology.  Attention was turned to the left pelvis. The peritoneum was incised until the left external iliac vein was identified. Careful dissection was performed to release the nodal packet lateral to the vein. Dissection was then carried out inferior and medially to the vein until the lateral pelvic wall was encountered. Gentle dissection was then carried proximally and distally along the vein. The obturator nerve was identified early at its proximal and distal ends. Dissection of this packet was carried out making sure not to cause injury to the obturator nerve. Hemostasis and lymph vessels were controlled with hemolock clips. The lymph node packet was then removed and sent for permanent pathology.    After this was done, the prograsp was used to  retract the bladder cephalad. After this was done, the endopelvic fascia was incised  bilaterally and carried anterior and posteriorly bilaterally. The  puboprostatic ligaments were then divided and then the stapler device  was used to divide and ligate the dorsal venous complex.     After this was complete, attention was turned back to the junction between the prostate and bladder neck.  Dissection was carried down between the prostate and the bladder until  the Foley catheter was encountered. I made sure to cut back further on the bladder neck than I normally would to ensure no posterior margin. I then removed some additional tissue from the bladder neck and sent this for frozen section as a posterior margin. This returned negative for prostate tissue and negative for malignancy. The balloon was deflated and then placed  through the dissection site and then anterior traction was placed by  grasping the catheter with the 4th  robot arm. The remainder of the bladder neck  was then dissected out, and dissection was carried down posteriorly making sure to avoid reentry into the bladder until the seminal vesicles were encountered. I was able to identify the right and left ureter orifice and they were seen to be effluxing clear yellow urine.  I incised an Denonvilliers' fascia and encountered a thick desmoplastic-like reaction in the area where the seminal vesicles would be encountered. After extensive dissection I encountered the seminal vesicles and the vas deferens, but they were encapsulated with a thick adherent tissue. The seminal vesicles and vas deferens were then dissected out completely bilaterally. The tip of the right seminal vesicle did tear off during dissection and this was sent with the final specimen. After these were done, they were placed on traction anteriorly and blunt  dissection was c  Attention was turned to the right prostatic pedicle. It was evaluated and found to have no good surgical plane.  It was felt that further attempt at nerve sparing on this right side would jeopardize cancer control and a wide dissection of this side was carried out. The right prostatic pedicle was controlled with sharp dissection and Hem-o-lok clips.    Attention was turned to the left prostatic pedicle. It was evaluated and found to have no good surgical plane.  It was felt that further attempt at nerve sparing on this left  side would jeopardize cancer control and a wide dissection of this side was carried out. The left prostatic pedicle was controlled with sharp dissection and Hem-o-lok clips.   Dissection was then carried up to the urethra. The urethra was then divided with scissors. He had a large amount of thick adherent tissue between the apex and the anterior pelvis/rectum. Sharp dissection was carried through this area to avoid any thermal injury to the rectum. The prostate was then free and sent to the side. I then  removed some additional tissue off of the anterior surface of the rectum and sent this as an apex margin with the final specimen.   The pelvis was irrigated and flushing of air into the rectal catheter showed no air bubbles. There was one area where there was a small amount of bleeding lateral to the rectum on the right side. A 2-0 Vicryl suture was used to place a figure-of-eight stitch in this area which maintain good hemostasis.  I was able to visualize efflux from both ureter orifices. After this, anastomosis of the bladder neck to the urethra was carried out. A 3-0 V-lock suture was used to approximate the posterior bladder to the posterior urethra.   I was able to identify both ureter orifices and they were seen to be effluxing clear yellow urine. I was able to void suturing these one I sutured the anastomosis. A 3-0 double armed V-lock suture was used in a running fashion to perform the anastomosis.  I was careful to ensure mucosal to mucosal approximation. Because the bladder neck was larger, I did have to perform some bladder neck tailoring. I ran the 3-0 double-armed V-lock around the anterior urethra leaving a gap along the anterior margin of the bladder. I then continued the anastomotic stitch approximating the bladder neck right side to the left side in a tennis racquet fashion on the anterior bladder neck. This was then tied. After this was done, a Foley catheter was placed through the anastomosis and into the bladder. It was irrigated with 200 cc of sterile fluid, and found to be water tight. It was tied down and the suture needles were removed from the body.    When the procedure was done after checking the anastomosis, there was noted to be a some bleeding from posterior to the bladder. This was difficult to assess given the location I could not move the bladder significantly without risk of tearing the bladder anastomosis. I placed a sheet of Surgicel in the right and left gutter between  the bladder and the rectum. An additional 5 cc was placed into the catheter balloon for a total of 20 cc in the balloon and the balloon was placed on traction.  This was held for 10 minutes and then the CO2 pressure was turned down to 5. There was noted to be no additional bleeding.  A Jackson-Pratt drain was placed through the port of the 4th robot arm. The specimen was placed in the EndoCatch bag. The robot was  undocked and the operating table was leveled. The EndoCatch bag was removed from the body by enlarging  the midline of the umbilicus. All ports were checked and found to be  free of bleeding. The 10 mm assistant port was closed with a suture  passer under direct visualization. The fascia of the umbilical port was close with interrupted vicryl suture in a figure-of-eight fashion until there was no defect palpable. Care was taken to avoid incorporation of intra-abdominal contents into the closure.  After this was done, the drain was sutured into place, and the local injection with Marcaine was performed. All wounds were irrigated with  sterile normal saline and then closed with staples.  Tegaderm and drain gauze was placed over the Jackson-Pratt drain and  Foley catheter was placed to closed drainage. The patient was placed  back in supine position. Anesthesia was reversed. He was taken to PACU  in stable condition. He will be kept overnight for evaluation.    All counts were correct at the end of the case.

## 2013-08-29 NOTE — Preoperative (Signed)
Beta Blockers   Reason not to administer Beta Blockers:Not Applicable, Took this am

## 2013-08-29 NOTE — Anesthesia Postprocedure Evaluation (Signed)
  Anesthesia Post-op Note  Patient: Arne Schlender  Procedure(s) Performed: Procedure(s) (LRB): ROBOTIC ASSISTED LAPAROSCOPIC RADICAL PROSTATECTOMY LEVEL 2, Lysis of adhesions (N/A) LYMPHADENECTOMY "BILATERAL PELVIC LYMPH NODE DISSECTION" (Bilateral)  Patient Location: PACU  Anesthesia Type: General  Level of Consciousness: awake and alert   Airway and Oxygen Therapy: Patient Spontanous Breathing  Post-op Pain: mild  Post-op Assessment: Post-op Vital signs reviewed, Patient's Cardiovascular Status Stable, Respiratory Function Stable, Patent Airway and No signs of Nausea or vomiting  Last Vitals:  Filed Vitals:   08/29/13 1745  BP: 135/89  Pulse: 70  Temp: 36.2 C  Resp: 12    Post-op Vital Signs: stable   Complications: No apparent anesthesia complications

## 2013-08-29 NOTE — Discharge Instructions (Signed)
DISCHARGE INSTRUCTIONS FOR RADICAL PROSTATECTOMY  MEDICATIONS: patient is discharged with:  1. Resume all your other meds from home.  ACTIVITY 1. No heavy lifting >10 pounds for 6 weeks 2. No sexual activity for 6 weeks 3. No strenuous activity for 6 weeks 4. No driving while on narcotic pain medications 5. Drink plenty of water 6. Continue to walk at home - you can still get blood clots when you are at  home, so keep active, but don't over do it. 7. You may resume normal activity in 2 wks (working but no heavy lifting) 8. DO NOT STRAIN TO HAVE A BOWEL MOVEMENT.  DIET 1. Go slow with your diet. You had a big surgery and your appetite will not be  as it was before surgery for quite some time. You may also notice a metallic  taste in your mouth, this should go away in a few weeks. Make sure you stay well  hydrated and drink lots of water. You can buy Boost or Ensure supplements to  drink between meals and any other time as well to make sure you are getting  adequate nutrition. 2. Do not eat a lot of heavy meals (ex hamburgers, steak) right away - you may  get sick to your stomach. 3. Do not strain to have a bowel movement.  If you have not been able to have a  BM after several day, go to your local pharmacy and obtain Mild of Magnesia and  take it as instructed on the bottle.   BATHING/WOUND CARE 1. You can shower and we recommend daily showers.  If you have steri strips over your  incision will fall off on their own in a week or so.  If you have staples, they will be removed when your catheter is removed.  2. The JP site will close up on its own in a few days - replace the 4x4 gauze if  it becomes saturated. 3. You do not need to dress your surgical wounds.  Let the steri-strips fall off  on their own. 4. DO NOT SUBMERGE YOUR CATHETER OR WOUND UNDER WATER.  CATHETER 1. Keep your catheter secured to your leg at all times with tape. 2. You may experience leakage of urine around  your catheter- as long as the  catheter continues to drain, this is normal.  If your catheter stops draining  return to the ER, but do not let anyone other than a urologist replace your  catheter. 3. You may also have blood in your urine, even after it has been clear for  several days; you may even pass some small blood clots or other material.  This  is normal as well.  If this happens, sit down and drink plenty of water to help  make urine to flush out your bladder.  If the blood in your urine becomes worse  after doing this, contact our office or return to the ER. 4. You may use the leg bag (small bag) during the day, but use the large bag at  night.  [place] (if you go home with a JP drain: 1. Keep the drain pinned to your shirt (not your pants because you will forget and pull the drain out when you pull your pants down). 2. Record the output of the drain and keep a total for each 24 hours. Once the total output is less than 50 mL per day, then call clinic to make an appointment to have the drain  removed. 3. You may clean around the drain with a damp wash cloth, but make sure you dry it well afterwards. You may place gauze around the drain if there is leakage around the drain. Change the gauze at least once daily. 4. Do not submerge the drain underwater in a bath. Cover the drain site if you take a shower and uncover it after the shower.   SIGNS/SYMPTOMS TO CALL: 1. Please call us if you have a fever greater than 101.5, uncontrolled  nausea/vomiting, uncontrolled pain, dizziness, unable to urinate,   chest pain, shortness of breath, leg swelling, leg pain, or any other concerns  or questions.  You can reach [place] at [phone].

## 2013-08-29 NOTE — Transfer of Care (Signed)
Immediate Anesthesia Transfer of Care Note  Patient: Tanner [name]  Procedure(s) Performed: Procedure(s) (LRB): ROBOTIC ASSISTED LAPAROSCOPIC RADICAL PROSTATECTOMY LEVEL 2, Lysis of adhesions (N/A) LYMPHADENECTOMY "BILATERAL PELVIC LYMPH NODE DISSECTION" (Bilateral)  Patient Location: PACU  Anesthesia Type: General  Level of Consciousness: sedated, patient cooperative and responds to stimulation  Airway & Oxygen Therapy: Patient Spontanous Breathing and Patient connected to face mask oxgen  Post-op Assessment: Report given to PACU RN and Post -op Vital signs reviewed and stable  Post vital signs: Reviewed and stable  Complications: No apparent anesthesia complications

## 2013-08-29 NOTE — H&P (Signed)
Urology History and Physical Exam  CC: Prostate cancer.  HPI:  58 year old male presents today for prostate cancer.  This is discovered based on elevated PSA which was 16.7 in October 2014.  It is not associated with gross hematuria.  Nothing makes it better or worse.  He underwent prostate biopsy which revealed all cores positive for Gleason 4+3 = 7 or Gleason 3+4 equal 7.  He underwent extensive staging which revealed no evidence of metastatic disease.  His prostate volume is 28 cc.  He has impotence with a SHIM score of 1.  We have discussed treatment options extensively.  He has seen a radiation oncologist.  He has decided to proceed with surgery for removal of his prostate.  We have discussed intraoperative management of his pelvic lymph nodes.  He wishes to proceed whether these are positive or not and therefore we will not be sending them for frozen section.  He presents today for robotic-assisted laparoscopic radical prostatectomy with a non-nerve sparing approach and bilateral pelvic lymph node dissection.  He has a history of a left inguinal hernia repair.  He also has history of alcohol use.  UA 08/19/13 was negative for signs of infection.  Preoperative lab values did show that he had some mild anemia with a hemoglobin of 12.9.  PMH: Past Medical History  Diagnosis Date  . Alcoholic hepatitis   . Hypertension   . 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 Shortness of breath     new onset- occasionally-at rest and exertion  . GERD (gastroesophageal reflux disease)   . Anxiety     PSH: Past Surgical History  Procedure Laterality Date  . Hand surgery Right   . Hernia repair    . Orif ankle fracture Right 12/10/2012    Procedure: OPEN REDUCTION INTERNAL FIXATION (ORIF) ANKLE FRACTURE;  Surgeon: Hessie Dibble, MD;  Location: [facility];  Service: Orthopedics;  Laterality: Right;    Allergies: Allergies  Allergen Reactions  . Dilaudid [Hydromorphone Hcl] Nausea And Vomiting    Pre pt history  . Oxycodone Nausea And Vomiting  . Procaine Hcl     Patient states he is allergic to novicaine  . Sulfa Antibiotics Rash    Medications: No prescriptions prior to admission     Social History: History   Social History  . Marital Status: Divorced    Spouse Name: N/A    Number of Children: 1  . Years of Education: N/A   Occupational History  . disabled    Social History Main Topics  . Smoking status: Former Smoker -- 1.50 packs/day for 20 years    Quit date: 12/21/2012  . Smokeless tobacco: Never Used  . Alcohol Use: 0.0 oz/week     Comment: 5 beers day  . Drug Use: Yes     Comment: marijuana-  last joint 08/24/13   cocaine 15 yrs ago  . Sexual Activity: Not on file   Other Topics Concern  . Not on file   Social History Narrative  . No narrative on file    Family History: Family History  Problem Relation Age of Onset  . Pancreatic cancer Sister   . Lung cancer Father     Review of Systems: Positive: Cough. DOE. Right foot pain. Chronic back pain. Negative: Fever, chest pain, or SOB.  A further 10 point review of systems was negative except what is listed in the HPI.  Physical Exam: Filed Vitals:   08/29/13 0902  BP: 114/76  Pulse: 83  Temp: 97.8 F (36.6 C)  Resp: 16    General: No acute distress.  Awake. Head:  Normocephalic.  Atraumatic. ENT:  EOMI.  Mucous membranes moist Neck:  Supple.  No lymphadenopathy. CV:  S1 present. S2 present. Regular rate. Pulmonary: Equal effort bilaterally.  Clear to auscultation bilaterally. Abdomen: Soft.  Non- tender to palpation. Skin:  Normal turgor.  No visible rash. Extremity: No gross deformity of bilateral upper extremities.  No gross deformity of    bilateral lower extremities. Neurologic: Alert. Appropriate mood.    Studies:  No results found for this basename: HGB, WBC, PLT,  in the last 72 hours  No results found for this basename: NA, K, CL, CO2, BUN, CREATININE, CALCIUM,  MAGNESIUM, GFRNONAA, GFRAA,  in the last 72 hours   No results found for this basename: PT, INR, APTT,  in the last 72 hours   No components found with this basename: ABG,     Assessment:  Prostate cancer  Plan: To OR for robotic-assisted laparoscopic radical prostatectomy with a non-nerve sparing approach and bilateral pelvic lymph node dissection.

## 2013-08-29 NOTE — Anesthesia Preprocedure Evaluation (Addendum)
Anesthesia Evaluation  Patient identified by MRN, date of birth, ID band Patient awake  General Assessment Comment: Alcoholic hepatitis     .  Hypertension     .  Prostate cancer  05/22/13       Gleason 4+3=7   .  Shortness of breath         new onset- occasionally-at rest and exertion   .  GERD (gastroesophageal reflux disease)     .  Anxiety     Reviewed: Allergy & Precautions, H&P , NPO status , Patient's Chart, lab work & pertinent test results  Airway Mallampati: II TM Distance: >3 FB Neck ROM: Limited    Dental no notable dental hx.    Pulmonary neg pulmonary ROS, shortness of breath, with exertion and at rest, former smoker,  breath sounds clear to auscultation  Pulmonary exam normal       Cardiovascular Exercise Tolerance: Good hypertension, Pt. on medications and Pt. on home beta blockers negative cardio ROS  Rhythm:Regular Rate:Normal     Neuro/Psych Anxiety negative neurological ROS  negative psych ROS   GI/Hepatic negative GI ROS, GERD-  Medicated,(+) Hepatitis -, Toxin RelatedH/O alcoholic hepatitis.   Endo/Other  negative endocrine ROS  Renal/GU negative Renal ROS  negative genitourinary   Musculoskeletal negative musculoskeletal ROS (+)   Abdominal   Peds negative pediatric ROS (+)  Hematology negative hematology ROS (+)   Anesthesia Other Findings   Reproductive/Obstetrics negative OB ROS                         Anesthesia Physical Anesthesia Plan  ASA: III  Anesthesia Plan: General   Post-op Pain Management:    Induction: Intravenous  Airway Management Planned: Oral ETT  Additional Equipment:   Intra-op Plan:   Post-operative Plan: Extubation in OR  Informed Consent: I have reviewed the patients History and Physical, chart, labs and discussed the procedure including the risks, benefits and alternatives for the proposed anesthesia with the patient or  authorized representative who has indicated his/her understanding and acceptance.   Dental advisory given  Plan Discussed with: CRNA  Anesthesia Plan Comments: (Plan video laryngoscope (which was used 12-10-12 due to chronic neck decreased ROM))       Anesthesia Quick Evaluation

## 2013-08-30 LAB — BASIC METABOLIC PANEL
BUN: 14 mg/dL (ref 6–23)
CALCIUM: 8.4 mg/dL (ref 8.4–10.5)
CO2: 27 mEq/L (ref 19–32)
Chloride: 100 mEq/L (ref 96–112)
Creatinine, Ser: 1.18 mg/dL (ref 0.50–1.35)
GFR, EST AFRICAN AMERICAN: 79 mL/min — AB (ref >90)
GFR, EST NON AFRICAN AMERICAN: 68 mL/min — AB (ref >90)
GLUCOSE: 104 mg/dL — AB (ref 70–99)
POTASSIUM: 5.6 meq/L — AB (ref 3.7–5.3)
Sodium: 136 mEq/L — ABNORMAL LOW (ref 137–147)

## 2013-08-30 LAB — HEMOGLOBIN AND HEMATOCRIT, BLOOD
HCT: 33.2 % — ABNORMAL LOW (ref 39.0–52.0)
Hemoglobin: 10.9 g/dL — ABNORMAL LOW (ref 13.0–17.0)

## 2013-08-30 MED ORDER — METOPROLOL TARTRATE 50 MG PO TABS
50.0000 mg | ORAL_TABLET | Freq: Two times a day (BID) | ORAL | Status: DC
  Administered 2013-08-30 – 2013-09-05 (×13): 50 mg via ORAL
  Filled 2013-08-30 (×15): qty 1

## 2013-08-30 MED ORDER — PANTOPRAZOLE SODIUM 40 MG PO TBEC
40.0000 mg | DELAYED_RELEASE_TABLET | Freq: Two times a day (BID) | ORAL | Status: DC
  Administered 2013-08-30 – 2013-09-05 (×13): 40 mg via ORAL
  Filled 2013-08-30 (×15): qty 1

## 2013-08-30 MED ORDER — CLONAZEPAM 0.5 MG PO TABS
0.5000 mg | ORAL_TABLET | Freq: Every day | ORAL | Status: DC
  Administered 2013-08-30 – 2013-09-05 (×7): 0.5 mg via ORAL
  Filled 2013-08-30 (×7): qty 1

## 2013-08-30 MED ORDER — LISINOPRIL 40 MG PO TABS: 40.0000 mg | ORAL_TABLET | Freq: Every day | ORAL | Status: DC

## 2013-08-30 NOTE — Progress Notes (Signed)
1 Day Post-Op Subjective: Patient reports nausea and pain control good. He  has not eaten. He had one episode of emesis last night.  Objective: Vital signs in last 24 hours: Temp:  [97 F (36.1 C)-98.1 F (36.7 C)] 97.5 F (36.4 C) (02/21 0500) Pulse Rate:  [70-83] 78 (02/21 0500) Resp:  [12-22] 16 (02/21 0500) BP: (114-157)/(76-98) 123/85 mmHg (02/21 0500) SpO2:  [99 %-100 %] 99 % (02/21 0500) Weight:  [83.9 kg (184 lb 15.5 oz)] 83.9 kg (184 lb 15.5 oz) (02/20 1259)  Intake/Output from previous day: 02/20 0701 - 02/21 0700 In: 4858.3 [I.V.:4758.3; IV Piggyback:100] Out: 1265 [Urine:960; Drains:105; Blood:200] Intake/Output this shift:    Physical Exam:  General:alert, cooperative and no distress GI: soft with appropriate tenderness. Port sites were dry. He has no CVAT.  His urine is draining and cleared    Lab Results:  Recent Labs  08/29/13 1735 08/30/13 0440  HGB 11.7* 10.9*  HCT 34.0* 33.2*   BMET  Recent Labs  08/30/13 0440  NA 136*  K 5.6*  CL 100  CO2 27  GLUCOSE 104*  BUN 14  CREATININE 1.18  CALCIUM 8.4   No results found for this basename: LABPT, INR,  in the last 72 hours No results found for this basename: LABURIN,  in the last 72 hours Results for orders placed during the hospital encounter of 12/09/12  MRSA PCR SCREENING     Status: None   Collection Time    12/10/12  5:36 AM      Result Value Ref Range Status   MRSA by PCR NEGATIVE  NEGATIVE Final   Comment:            The GeneXpert MRSA Assay (FDA     approved for NASAL specimens     only), is one component of a     comprehensive MRSA colonization     surveillance program. It is not     intended to diagnose MRSA     infection nor to guide or     monitor treatment for     MRSA infections.    Studies/Results: No results found.  Assessment/Plan: He appears to be doing well postoperatively. He has not having any flank pain and has had good urine output overnight. There is no  evidence of active bleeding at this time. We discussed discharge later this afternoon if he was tolerating PO  Intake. He was however reluctant to go home today. He said that he lives alone with his 8 year old daughter coming by to help out and said that he would feel better if he could stay another 24 hours. He remains nauseated so I will slowly advance his diet and have recommended he be up and walking today. I will then plan for discharge in the morning.  Advance diet as tolerated  Ambulate  Anticipate discharge in a.m.   LOS: 1 day   Arjan Strohm C 08/30/2013, 8:43 AM

## 2013-08-31 LAB — BASIC METABOLIC PANEL
BUN: 8 mg/dL (ref 6–23)
CHLORIDE: 96 meq/L (ref 96–112)
CO2: 25 mEq/L (ref 19–32)
CREATININE: 1.02 mg/dL (ref 0.50–1.35)
Calcium: 8.3 mg/dL — ABNORMAL LOW (ref 8.4–10.5)
GFR calc Af Amer: >90 mL/min (ref >90)
GFR calc non Af Amer: 81 mL/min — ABNORMAL LOW (ref >90)
GLUCOSE: 100 mg/dL — AB (ref 70–99)
POTASSIUM: 4.3 meq/L (ref 3.7–5.3)
Sodium: 131 mEq/L — ABNORMAL LOW (ref 137–147)

## 2013-08-31 LAB — HEMOGLOBIN AND HEMATOCRIT, BLOOD
HCT: 30.9 % — ABNORMAL LOW (ref 39.0–52.0)
Hemoglobin: 10.3 g/dL — ABNORMAL LOW (ref 13.0–17.0)

## 2013-08-31 LAB — CREATININE, FLUID (PLEURAL, PERITONEAL, JP DRAINAGE): Creat, Fluid: 1 mg/dL

## 2013-08-31 NOTE — Discharge Summary (Signed)
Physician Discharge Summary  Patient ID: Tanner [name] MRN: [id number] DOB/AGE: 1956-01-01 58 y.o.  Admit date: 08/29/2013 Discharge date: 08/31/2013  Admission Diagnoses:  Discharge Diagnoses:  Active Problems:   Malignant neoplasm of prostate   Discharged Condition: good  Hospital Course: He was admitted for elective radical prostatectomy and bilateral lymph node dissection for biopsy-proven adenocarcinoma of the prostate. He underwent a robotically-assisted laparoscopic radical prostatectomy and lymph node dissection which proceeded without complication. Postoperatively he appeared to be doing well on his first postop day but was experiencing some mild nausea. He was observed for 24 hours further and his nausea resolved. He was having some mild discomfort in the area of the prostate as anticipated. His urine remained completely clear and he continued to have an excellent urine output without flank pain. His drain output remained low at ~50 cc/shift but the last shift increased to 140 cc. It appears clear. His abdomen is soft and benign. He is tolerating regular diet. He is therefore felt to be ready for discharge at this time. Prior to his discharge his drain fluid will be sent for creatinine and this will determine whether he will be discharged with his Keenan Bachelor drain or whether it can be removed prior to his discharge.  Treatments: As above  Discharge Exam: Blood pressure 164/93, pulse 86, temperature 98.4 F (36.9 C), temperature source Oral, resp. rate 17, height 5\' 10"  (1.778 m), weight 83.9 kg (184 lb 15.5 oz), SpO2 92.00%. His abdomen is noted to be soft, with appropriate mild tenderness but no peritoneal signs. His port sites look good with no sign of infection. The Foley catheter is draining it is clear.  Disposition: 01-Home or Self Care     Medication List    STOP taking these medications       doxazosin 8 MG tablet  Commonly known as:  CARDURA     GOODY BODY PAIN  500-325 MG Pack  Generic drug:  Aspirin-Acetaminophen     oxyCODONE-acetaminophen 5-325 MG per tablet  Commonly known as:  PERCOCET/ROXICET      TAKE these medications       ciprofloxacin 500 MG tablet  Commonly known as:  CIPRO  Take 1 tablet (500 mg total) by mouth 2 (two) times daily. Begin the day before you return to have your catheter removed.     clonazePAM 0.5 MG tablet  Commonly known as:  KLONOPIN  Take 0.5 mg by mouth 3 (three) times daily.     furosemide 40 MG tablet  Commonly known as:  LASIX  Take 20 mg by mouth every morning.     HYDROcodone-acetaminophen 5-325 MG per tablet  Commonly known as:  NORCO/VICODIN  Take 1-2 tablets by mouth every 4 (four) hours as needed for moderate pain.     hyoscyamine 0.125 MG tablet  Commonly known as:  LEVSIN, ANASPAZ  Take 1 tablet (0.125 mg total) by mouth every 4 (four) hours as needed (bladder spasms).     lisinopril 40 MG tablet  Commonly known as:  PRINIVIL,ZESTRIL  Take 40 mg by mouth every morning.     metoprolol 50 MG tablet  Commonly known as:  LOPRESSOR  Take 50 mg by mouth 2 (two) times daily.     omeprazole 40 MG capsule  Commonly known as:  PRILOSEC  Take 40 mg by mouth 2 (two) times daily.     oxybutynin 5 MG tablet  Commonly known as:  DITROPAN  Take 1 tablet (5 mg total) by mouth every  6 (six) hours as needed for bladder spasms.     senna-docusate 8.6-50 MG per tablet  Commonly known as:  SENOKOT S  Take 1 tablet by mouth 2 (two) times daily.           Follow-up Information   Follow up with Molli Hazard, MD On 09/10/2013. (9:15 for catheter removal.)    Specialty:  Urology   Contact information:   [facility]  [place] [address] [phone]       Signed: Claybon Jabs 08/31/2013, 7:21 AM

## 2013-08-31 NOTE — Progress Notes (Signed)
Pt said "I don't feel good &I'm  not ready  to go home, I'm still nauseated". Family is at bedside & they want pt to stay one more day. Dr. Junious Silk notified & spoke with the family on the phone as well.

## 2013-08-31 NOTE — Progress Notes (Signed)
Patient ID: Tanner [name], male   DOB: 1955-10-16, 58 y.o.   MRN: [id number]   H/H , BMET appears stable.

## 2013-08-31 NOTE — Progress Notes (Signed)
Patient ID: Tanner [name], male   DOB: [birth year], 58 y.o.   MRN: [id number]  I received a page from the nurse that the patient feels weak and nauseated and not ready for discharge.  Patient reports he feels weak and nauseated.  Fortunately he just had a few small bowel movements.   Filed Vitals:   08/31/13 1321  BP: 158/100  Pulse: 82  Temp: 98.8 F (37.1 C)  Resp: 20  PE: He is in no acute distress, watching [place] [address], laying in bed Cardiovascular regular rate and rhythm Respirations-normal effort depth and rate Abdomen-soft and nontender, mild distention, good bowel sounds Extremities-no pain or edema, no swelling  Impression - resolving ileus Plan Encouraged ambulation Recheck a set of electrolytes and H&H Plan discharge in a.m.

## 2013-09-01 ENCOUNTER — Encounter (HOSPITAL_COMMUNITY): Payer: Self-pay | Admitting: Urology

## 2013-09-01 MED ORDER — LISINOPRIL 40 MG PO TABS
40.0000 mg | ORAL_TABLET | Freq: Every day | ORAL | Status: DC
  Administered 2013-09-01 – 2013-09-05 (×5): 40 mg via ORAL
  Filled 2013-09-01 (×5): qty 1

## 2013-09-01 MED ORDER — SODIUM CHLORIDE 0.9 % IV SOLN
INTRAVENOUS | Status: DC
  Administered 2013-09-01 – 2013-09-02 (×2): via INTRAVENOUS

## 2013-09-01 MED ORDER — FUROSEMIDE 20 MG PO TABS
20.0000 mg | ORAL_TABLET | Freq: Every day | ORAL | Status: DC
  Administered 2013-09-01 – 2013-09-05 (×5): 20 mg via ORAL
  Filled 2013-09-01 (×5): qty 1

## 2013-09-01 MED ORDER — HEPARIN SODIUM (PORCINE) 5000 UNIT/ML IJ SOLN
5000.0000 [IU] | Freq: Three times a day (TID) | INTRAMUSCULAR | Status: DC
  Administered 2013-09-01 – 2013-09-05 (×13): 5000 [IU] via SUBCUTANEOUS
  Filled 2013-09-01 (×15): qty 1

## 2013-09-01 MED ORDER — MORPHINE SULFATE 15 MG PO TABS
15.0000 mg | ORAL_TABLET | ORAL | Status: DC | PRN
Start: 2013-09-01 — End: 2013-09-05
  Administered 2013-09-01 – 2013-09-05 (×24): 15 mg via ORAL
  Filled 2013-09-01 (×24): qty 1

## 2013-09-01 MED ORDER — DOXAZOSIN MESYLATE 8 MG PO TABS
8.0000 mg | ORAL_TABLET | Freq: Every day | ORAL | Status: DC
  Administered 2013-09-01 – 2013-09-05 (×5): 8 mg via ORAL
  Filled 2013-09-01 (×5): qty 1

## 2013-09-01 NOTE — Care Management Note (Addendum)
    Page 1 of 1   09/05/2013     1:53:10 PM   CARE MANAGEMENT NOTE 09/05/2013  Patient:  Tanner [name], Tanner [name]   Account Number:  [id number]  Date Initiated:  08/30/2013  Documentation initiated by:  Dessa Phi  Subjective/Objective Assessment:   58 Y/O M ADMITTED W/PROSTATE CA.     Action/Plan:   FROM HOME ALONE.HAS RW,W/C,3N1.HAS PCP,PHARMACY.   Anticipated DC Date:  09/05/2013   Anticipated DC Plan:  IP [facility]HAB FACILITY      DC Planning Services  CM consult      Choice offered to / List presented to:             Status of service:  Completed, signed off Medicare Important Message given?   (If response is "NO", the following Medicare IM given date fields will be blank) Date Medicare IM given:   Date Additional Medicare IM given:    Discharge Disposition:  ACUTE TO ACUTE TRANS  Per UR Regulation:  Reviewed for med. necessity/level of care/duration of stay  If discussed at Post of Stay Meetings, dates discussed:   09/04/2013    Comments:  09/05/13 Shanessa Hodak RN,BSN NCM [phone] D/C CIR.ONCE HAS MD/RM,BED NSG TO MANAGE TRANSPORT VIA [facility].  09/04/13 Yuniel Blaney RN,BSN NCM [facility] CONS PLACED.AWAIT RECOMMENDATIONS.  09/03/13 Catherene Kaleta RN,BSN,NCM [phone] MRI+STROKE.POD#5 ROBOTIC PROSTATECTOMY.NEURO FOLLOWING-AWAIT RECOMMENDATIONS.  09/01/13 Santana Edell RN,BSN NCM [phone] D/C HOME NO NEEDS OR ORDERS.

## 2013-09-01 NOTE — Progress Notes (Signed)
Urology Progress Note  Subjective:     No acute urologic events overnight.  Positive BM and flatus. Not tolerating regular diet. Tolerating fluids. Still with some nausea.  Ambulating.   ROS: Negative: Chest pain or SOB.  Objective:  Patient Vitals for the past 24 hrs:  BP Temp Temp src Pulse Resp SpO2  09/01/13 0520 171/90 mmHg 98.9 F (37.2 C) Oral 85 18 95 %  09/01/13 0340 182/102 mmHg - - 85 - -  09/01/13 0230 172/102 mmHg 98.4 F (36.9 C) Oral 84 - 95 %  08/31/13 2030 165/91 mmHg 98.1 F (36.7 C) Oral 87 18 96 %  08/31/13 1321 158/100 mmHg 98.8 F (37.1 C) Oral 82 20 94 %    Physical Exam: General:  No acute distress, awake Cardiovascular:    [x]   S1/S2 present, RRR  []   Irregularly irregular Chest:  CTA-B Abdomen:               []  Soft, appropriately TTP  []  Soft, NTTP  [x]  Soft, appropriately TTP, incision(s) clean/dry/intact  Genitourinary: Negative edema. Urine clear yellow.     I/O last 3 completed shifts: In: 2180 [P.O.:1680; I.V.:500] Out: 2630 [Urine:2450; Drains:180]  Recent Labs     08/30/13  0440  08/31/13  1558  HGB  10.9*  10.3*    Recent Labs     08/30/13  0440  08/31/13  1558  NA  136*  131*  K  5.6*  4.3  CL  100  96  CO2  27  25  BUN  14  8  CREATININE  1.18  1.02  CALCIUM  8.4  8.3*  GFRNONAA  68*  81*  GFRAA  79*  >90     No results found for this basename: PT, INR, APTT,  in the last 72 hours   No components found with this basename: ABG,     Length of stay: 3 days.  Assessment: Prostate cancer. POD#3 Robotic radical prostatectomy with bilateral pelvic lymph node dissection and lysis of colonic adhesions.   Plan: Restart IV w/ NS. Add heparin prophylaxis. Continue SCD. Incentive spirometry. Restart cardura 8 mg daily and lasix 20 mg daily (home regiment) for blood pressure. Continue ambulation.   Rolan Bucco, MD [phone]

## 2013-09-01 NOTE — Progress Notes (Signed)
Patient ambulated the halls and is tolerating well. Pain is controlled with medication and nausea has resolved with antiemetic.  No complaints from patient at this time. Will continue to encourage ambulation and incentive spirometer. J.Sania Noy, RN

## 2013-09-01 NOTE — Progress Notes (Addendum)
Pt bp was 172/102 feels hot, no temp, nauseated, and dry hives. Gave pt ativan 1mg  IV. Due to Tanner [name]. Will continue to monitor patient for symptoms. Philemon Kingdom D RN

## 2013-09-01 NOTE — Progress Notes (Signed)
Pt BP 180/100 pt states his pain is down to a 2. Paged dr to get his lisinopril added back. Will start lisinopril 40mg . Philemon Kingdom D RN

## 2013-09-02 ENCOUNTER — Inpatient Hospital Stay (HOSPITAL_COMMUNITY): Payer: Medicaid Other

## 2013-09-02 DIAGNOSIS — R270 Ataxia, unspecified: Secondary | ICD-10-CM

## 2013-09-02 DIAGNOSIS — R279 Unspecified lack of coordination: Secondary | ICD-10-CM

## 2013-09-02 LAB — CK TOTAL AND CKMB (NOT AT ARMC)
CK, MB: 1 ng/mL (ref 0.3–4.0)
RELATIVE INDEX: INVALID (ref 0.0–2.5)
Total CK: 52 U/L (ref 7–232)

## 2013-09-02 LAB — BASIC METABOLIC PANEL
BUN: 9 mg/dL (ref 6–23)
CHLORIDE: 91 meq/L — AB (ref 96–112)
CO2: 24 meq/L (ref 19–32)
Calcium: 8.5 mg/dL (ref 8.4–10.5)
Creatinine, Ser: 0.99 mg/dL (ref 0.50–1.35)
GFR calc Af Amer: >90 mL/min (ref >90)
GFR calc non Af Amer: >90 mL/min (ref >90)
GLUCOSE: 94 mg/dL (ref 70–99)
POTASSIUM: 3.9 meq/L (ref 3.7–5.3)
SODIUM: 131 meq/L — AB (ref 137–147)

## 2013-09-02 LAB — CBC
HEMATOCRIT: 31.6 % — AB (ref 39.0–52.0)
HEMOGLOBIN: 10.6 g/dL — AB (ref 13.0–17.0)
MCH: 32.6 pg (ref 26.0–34.0)
MCHC: 33.5 g/dL (ref 30.0–36.0)
MCV: 97.2 fL (ref 78.0–100.0)
Platelets: 138 10*3/uL — ABNORMAL LOW (ref 150–400)
RBC: 3.25 MIL/uL — AB (ref 4.22–5.81)
RDW: 12.3 % (ref 11.5–15.5)
WBC: 6.6 10*3/uL (ref 4.0–10.5)

## 2013-09-02 LAB — PROTIME-INR
INR: 1.01 (ref 0.00–1.49)
Prothrombin Time: 13.1 seconds (ref 11.6–15.2)

## 2013-09-02 LAB — HEMOGLOBIN A1C
Hgb A1c MFr Bld: 5 % (ref <5.7)
Mean Plasma Glucose: 97 mg/dL (ref <117)

## 2013-09-02 LAB — APTT: APTT: 27 s (ref 24–37)

## 2013-09-02 MED ORDER — LORAZEPAM 2 MG/ML IJ SOLN
2.0000 mg | Freq: Once | INTRAMUSCULAR | Status: AC
  Administered 2013-09-02: 2 mg via INTRAVENOUS
  Filled 2013-09-02: qty 1

## 2013-09-02 MED ORDER — ONDANSETRON HCL 4 MG PO TABS: 4.0000 mg | ORAL_TABLET | ORAL | Status: DC | PRN

## 2013-09-02 MED ORDER — MORPHINE SULFATE 15 MG PO TABS: 15.0000 mg | ORAL_TABLET | ORAL | Status: DC | PRN

## 2013-09-02 MED ORDER — ASPIRIN EC 81 MG PO TBEC
81.0000 mg | DELAYED_RELEASE_TABLET | Freq: Every day | ORAL | Status: DC
  Administered 2013-09-02 – 2013-09-05 (×4): 81 mg via ORAL
  Filled 2013-09-02 (×4): qty 1

## 2013-09-02 NOTE — Progress Notes (Signed)
Urology Progress Note  Subjective:     No acute urologic events overnight. No emesis. Mild nausea controlled w/ meds. Positive BM and flatus. Tolerating fluids and solids by mouth.  Ambulating. PO morphine controlling pain better.   ROS: Negative: Chest pain or SOB.  Objective:  Patient Vitals for the past 24 hrs:  BP Temp Temp src Pulse Resp SpO2  09/02/13 0500 160/87 mmHg 98 F (36.7 C) Oral 83 18 98 %  09/01/13 2045 163/95 mmHg 97.8 F (36.6 C) Oral 78 16 95 %  09/01/13 1332 168/89 mmHg 98.4 F (36.9 C) Oral 78 16 97 %  09/01/13 1045 174/98 mmHg - - - - -    Physical Exam: General:  No acute distress, awake Cardiovascular:    [x]   S1/S2 present, RRR  []   Irregularly irregular Chest:  CTA-B Abdomen:               []  Soft, appropriately TTP  []  Soft, NTTP  [x]  Soft, appropriately TTP, incision(s) clean/dry/intact  Genitourinary: Negative edema. Urine clear yellow.     I/O last 3 completed shifts: In: 1560 [P.O.:1560] Out: 4340 [Urine:4300; Drains:40]  Recent Labs     08/31/13  1558  HGB  10.3*    Recent Labs     08/31/13  1558  NA  131*  K  4.3  CL  96  CO2  25  BUN  8  CREATININE  1.02  CALCIUM  8.3*  GFRNONAA  81*  GFRAA  >90     No results found for this basename: PT, INR, APTT,  in the last 72 hours   No components found with this basename: ABG,     Length of stay: 4 days.  Assessment: Prostate cancer. POD#4 Robotic radical prostatectomy with bilateral pelvic lymph node dissection and lysis of colonic adhesions.   Plan: Blood pressure better controlled. Discharge home.   Rolan Bucco, MD [phone]

## 2013-09-02 NOTE — Consult Note (Signed)
Referring Physician: Jasmine December    Chief Complaint: code stroke  HPI:                                                                                                                                         Tanner [name] is an 58 y.o. male with recent abdominal surgery who noted yesterday he had a new onset of right leg weakness.  This occurred while walking in the hall.  Throughout the night and into today he has noted increased weakness of the right arm and leg.  Code stroke was called.  On arrival he continued to complain of right arm and leg weakness.  He had no drift but did show weakness on the right side.  He also showed ataxia on the right arm and leg.  He was not a tPA candidate due to recent surgery.  HE was not a intraarterial candidate due to likely not a large vessel.   Date last known well: Date: 09/01/2013 Time last known well: Unable to determine tPA Given: No: recent surgery, out of window.   Past Medical History  Diagnosis Date  . Alcoholic hepatitis   . Hypertension   . 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 Shortness of breath     new onset- occasionally-at rest and exertion  . GERD (gastroesophageal reflux disease)   . Anxiety     Past Surgical History  Procedure Laterality Date  . Hand surgery Right   . Hernia repair    . Orif ankle fracture Right 12/10/2012    Procedure: OPEN REDUCTION INTERNAL FIXATION (ORIF) ANKLE FRACTURE;  Surgeon: Hessie Dibble, MD;  Location: [facility];  Service: Orthopedics;  Laterality: Right;  . Robot assisted laparoscopic radical prostatectomy N/A 08/29/2013    Procedure: ROBOTIC ASSISTED LAPAROSCOPIC RADICAL PROSTATECTOMY LEVEL 2, Lysis of adhesions;  Surgeon: Molli Hazard, MD;  Location: [facility];  Service: Urology;  Laterality: N/A;  . Lymphadenectomy Bilateral 08/29/2013    Procedure: LYMPHADENECTOMY "BILATERAL PELVIC LYMPH NODE DISSECTION";  Surgeon: Molli Hazard, MD;  Location: [facility];  Service: Urology;   Laterality: Bilateral;    Family History  Problem Relation Age of Onset  . Pancreatic cancer Sister   . Lung cancer Father    Social History:  reports that he quit smoking about 8 months ago. He has never used smokeless tobacco. He reports that he drinks alcohol. He reports that he uses illicit drugs.  Allergies:  Allergies  Allergen Reactions  . Dilaudid [Hydromorphone Hcl] Nausea And Vomiting    Pre pt history  . Oxycodone Nausea And Vomiting  . Procaine Hcl     Patient states he is allergic to novicaine  . Sulfa Antibiotics Rash    Medications:  Scheduled: . bisacodyl  10 mg Rectal BID  . clonazePAM  0.5 mg Oral Daily  . doxazosin  8 mg Oral Daily  . folic acid  1 mg Oral Daily  . furosemide  20 mg Oral Daily  . heparin subcutaneous  5,000 Units Subcutaneous Q8H  . lisinopril  40 mg Oral Daily  . metoprolol tartrate  50 mg Oral BID  . multivitamin with minerals  1 tablet Oral Daily  . pantoprazole  40 mg Oral BID  . senna-docusate  1 tablet Oral BID  . thiamine  100 mg Oral Daily   Or  . thiamine  100 mg Intravenous Daily    ROS:                                                                                                                                       History obtained from the patient  General ROS: negative for - chills, fatigue, fever, night sweats, weight gain or weight loss Psychological ROS: negative for - behavioral disorder, hallucinations, memory difficulties, mood swings or suicidal ideation Ophthalmic ROS: negative for - blurry vision, double vision, eye pain or loss of vision ENT ROS: negative for - epistaxis, nasal discharge, oral lesions, sore throat, tinnitus or vertigo Allergy and Immunology ROS: negative for - hives or itchy/watery eyes Hematological and Lymphatic ROS: negative for - bleeding problems, bruising or swollen  lymph nodes Endocrine ROS: negative for - galactorrhea, hair pattern changes, polydipsia/polyuria or temperature intolerance Respiratory ROS: negative for - cough, hemoptysis, shortness of breath or wheezing Cardiovascular ROS: negative for - chest pain, dyspnea on exertion, edema or irregular heartbeat Gastrointestinal ROS: negative for - abdominal pain, diarrhea, hematemesis, nausea/vomiting or stool incontinence Genito-Urinary ROS: negative for - dysuria, hematuria, incontinence or urinary frequency/urgency Musculoskeletal ROS: negative for - joint swelling or muscular weakness Neurological ROS: as noted in HPI Dermatological ROS: negative for rash and skin lesion changes  Neurologic Examination:                                                                                                      Blood pressure 160/87, pulse 83, temperature 98 F (36.7 C), temperature source Oral, resp. rate 18, height 5\' 10"  (1.778 m), weight 83.9 kg (184 lb 15.5 oz), SpO2 98.00%.   Mental Status: Alert, oriented, thought content appropriate.  Speech fluent without evidence of aphasia.  Able to follow 3 step commands without difficulty. Cranial Nerves: II: Discs flat bilaterally; Visual fields grossly normal, pupils  equal, round, reactive to light and accommodation III,IV, VI: ptosis not present, extra-ocular motions intact bilaterally V,VII: smile symmetric, facial light touch sensation normal bilaterally VIII: hearing normal bilaterally IX,X: gag reflex present XI: bilateral shoulder shrug XII: midline tongue extension without atrophy or fasciculations  Motor: Right : Upper extremity   4/5    Left:     Upper extremity   5/5  Lower extremity   4/5     Lower extremity   5/5 Tone and bulk:normal tone throughout; no atrophy noted Sensory: Pinprick and light touch intact throughout, bilaterally with exception of the right ankle secondary to previous surgery Deep Tendon Reflexes:  Right: Upper  Extremity   Left: Upper extremity   biceps (C-5 to C-6) 2/4   biceps (C-5 to C-6) 2/4 tricep (C7) 2/4    triceps (C7) 2/4 Brachioradialis (C6) 2/4  Brachioradialis (C6) 2/4  Lower Extremity Lower Extremity  quadriceps (L-2 to L-4) 2/4   quadriceps (L-2 to L-4) 2/4 Achilles (S1) 1/4   Achilles (S1) 1/4  Plantars: Right: upgoing   Left: downgoing Cerebellar: normal finger-to-nose on the left but ataxic on the right ,  normal heel-to-shin test on the left but ataxic on the right Gait: not tested CV: pulses palpable throughout    Lab Results: Basic Metabolic Panel:  Recent Labs Lab 08/30/13 0440 08/31/13 1558  NA 136* 131*  K 5.6* 4.3  CL 100 96  CO2 27 25  GLUCOSE 104* 100*  BUN 14 8  CREATININE 1.18 1.02  CALCIUM 8.4 8.3*    Liver Function Tests: No results found for this basename: AST, ALT, ALKPHOS, BILITOT, PROT, ALBUMIN,  in the last 168 hours No results found for this basename: LIPASE, AMYLASE,  in the last 168 hours No results found for this basename: AMMONIA,  in the last 168 hours  CBC:  Recent Labs Lab 08/29/13 1735 08/30/13 0440 08/31/13 1558  HGB 11.7* 10.9* 10.3*  HCT 34.0* 33.2* 30.9*    Cardiac Enzymes: No results found for this basename: CKTOTAL, CKMB, CKMBINDEX, TROPONINI,  in the last 168 hours  Lipid Panel: No results found for this basename: CHOL, TRIG, HDL, CHOLHDL, VLDL, LDLCALC,  in the last 168 hours  CBG: No results found for this basename: GLUCAP,  in the last 168 hours  Microbiology: Results for orders placed during the hospital encounter of 12/09/12  MRSA PCR SCREENING     Status: None   Collection Time    12/10/12  5:36 AM      Result Value Ref Range Status   MRSA by PCR NEGATIVE  NEGATIVE Final   Comment:            The GeneXpert MRSA Assay (FDA     approved for NASAL specimens     only), is one component of a     comprehensive MRSA colonization     surveillance program. It is not     intended to diagnose MRSA      infection nor to guide or     monitor treatment for     MRSA infections.    Coagulation Studies: No results found for this basename: LABPROT, INR,  in the last 72 hours  Imaging: Ct Head Wo Contrast  09/02/2013   CLINICAL DATA:  Acute onset right-side weakness.  EXAM: CT HEAD WITHOUT CONTRAST  TECHNIQUE: Contiguous axial images were obtained from the base of the skull through the vertex without intravenous contrast.  COMPARISON:  None.  FINDINGS: There is no  evidence of acute abnormality including infarction,hemorrhage, midline shift or abnormal extra-axial fluid collection. No hydrocephalus or pneumocephalus. The calvarium is intact. Imaged paranasal sinuses and mastoid air cells are clear. Cortical atrophy is noted.  IMPRESSION: No acute abnormality.   Electronically Signed   By: Inge Rise M.D.   On: 09/02/2013 15:19       Assessment and plan discussed with with attending physician and they are in agreement.    Etta Quill PA-C [facility] [phone]  09/02/2013, 4:09 PM   Assessment: 58 y.o. male with new onset right arm and leg weakness S/P abdominal surgery. Onset was > 24 hours prior to evaluation.  Symptoms consisted of only right arm and leg ataxia with right sided weakness.  Due to recent surgery and onset of symptoms > 8 hours prior he was not a tPA candidate or intraarterial candidate. 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   Stroke Risk Factors - hypertension   Recommend: 1. HgbA1c, fasting lipid panel 2. MRI, MRA  of the brain without contrast 3. PT consult, OT consult, Speech consult 4. Echocardiogram 5. Carotid dopplers 6. Prophylactic therapy-Antiplatelet med: Aspirin - dose 81 mg IF MRI positive for CVA 7. Risk  Factor modification  Roland Rack, MD [facility] [phone]  If 7pm- 7am, please page neurology on call at [phone].

## 2013-09-02 NOTE — Progress Notes (Signed)
I reviewed the results of the pathology of the prostate w/ the patient and his sister, Tanner [name]. This showed 3+4=7, extra-capsular extention, seminal vesicle involvement, negative margins, negative lymph nodes. Final stage is pT3b N0.  I explained to Tanner [name] that he is still being worked up for stroke, and that an MRI is planned.

## 2013-09-02 NOTE — Progress Notes (Signed)
This patient was preparing for discharge home today.  He has been reluctant to leave.   he complained the nurse about right-sided weakness.  I went to see the patient immediately.  He combined right-sided weakness.  He states that this started yesterday.  I reminded the patient that he was moving his arm and leg normally this morning.  He states yesterday may have only occurred less than 1 hour ago.  He also states that he has paresthesia in his whole right arm and whole right leg.  He states that he cannot move his right arm but in conversation I do notice that he is moving his right arm.  On physical exam he has weak grip in the right arm but not the left arm.  He has weakness and proximal and distal muscles of the right upper and lower extremity compared to the left.  Nurse noted no cranial nerve problems and no deviation of the tongue.  When I asked the patient is status tongue deviates to the right.  Other cranial nerves appear to be intact.  I recommend a stat CT of the head without contrast.  We will see what this shows.

## 2013-09-02 NOTE — Progress Notes (Signed)
Pts ride here to take him home and pt is c/o rt sided weakness.  Face is symmetrical, weakness is noted to rt side, grip is weak and minimal movement, able to move RLE, and able to pivot to chair.  Dr Jasmine December aware and head CT ordered

## 2013-09-02 NOTE — Progress Notes (Signed)
Head CT negative, Dr Leonel Ramsay requested to call code stroke and that is in process now

## 2013-09-02 NOTE — Progress Notes (Signed)
CT head negative for stroke. Neurology consulted. Stroke team activated.

## 2013-09-03 DIAGNOSIS — I369 Nonrheumatic tricuspid valve disorder, unspecified: Secondary | ICD-10-CM

## 2013-09-03 LAB — LIPID PANEL
CHOLESTEROL: 146 mg/dL (ref 0–200)
HDL: 24 mg/dL — AB (ref >39)
LDL Cholesterol: 92 mg/dL (ref 0–99)
TRIGLYCERIDES: 148 mg/dL (ref <150)
Total CHOL/HDL Ratio: 6.1 RATIO
VLDL: 30 mg/dL (ref 0–40)

## 2013-09-03 NOTE — Progress Notes (Signed)
Urology Progress Note  Subjective:     It appears he had a stroke. Stroke team activated yesterday and MRI shows ischemic stroke. Not a candidate for TPA. He has continued right hemiplegia and paresthesia.   No acute urologic events overnight. No emesis. Nausea mostly resolved. Tolerating regular diet. Positive BM and flatus.      ROS: Negative: Chest pain or SOB.  Objective:  Patient Vitals for the past 24 hrs:  BP Temp Temp src Pulse Resp SpO2  09/03/13 0500 153/89 mmHg 99.1 F (37.3 C) Oral 80 18 95 %  09/02/13 2103 157/84 mmHg 98.2 F (36.8 C) Oral 91 16 95 %    Physical Exam: General:  No acute distress, awake Cardiovascular:    [x]   S1/S2 present, RRR  []   Irregularly irregular Chest:  CTA-B Abdomen:               []  Soft, appropriately TTP  []  Soft, NTTP  [x]  Soft, appropriately TTP, incision(s) clean/dry/intact  Genitourinary: Negative edema. Urine clear yellow. Neuro: CN 2-12 intact (no longer has tongue deviation like he did yesterday). RUE & RLE weak proximally & distally along w/ paresthesia.     I/O last 3 completed shifts: In: 3431.7 [P.O.:480; I.V.:2951.7] Out: 3000 [Urine:3000]  Recent Labs     08/31/13  1558  09/02/13  1716  HGB  10.3*  10.6*  WBC   --   6.6  PLT   --   138*    Recent Labs     08/31/13  1558  09/02/13  1716  NA  131*  131*  K  4.3  3.9  CL  96  91*  CO2  25  24  BUN  8  9  CREATININE  1.02  0.99  CALCIUM  8.3*  8.5  GFRNONAA  81*  >90  GFRAA  >90  >90     Recent Labs     09/02/13  1716  INR  1.01  APTT  27     No components found with this basename: ABG,     Length of stay: 5 days.  Assessment: Prostate cancer. POD#5 Robotic radical prostatectomy with bilateral pelvic lymph node dissection and lysis of colonic adhesions.   Plan: OK for discharge home from prostate cancer/ GU point of view.  Physical therapy for new neurological deficits.  Await further neurology recommendations. Appreciate their  help thus far.   Rolan Bucco, MD [phone]

## 2013-09-03 NOTE — Progress Notes (Signed)
Notified the PA on call for Urology as well as the on call for Neurology that the patient's MRI results were available in EPIC.  Will continue to monitor patient.

## 2013-09-03 NOTE — Progress Notes (Signed)
Echo Lab  2D Echocardiogram completed.  Floyd, RDCS 09/03/2013 9:13 AM

## 2013-09-03 NOTE — Progress Notes (Signed)
Rehab Admissions Coordinator Note:  Patient was screened by Retta Diones for appropriateness for an Inpatient Acute Rehab Consult.  At this time, we are recommending Inpatient Rehab consult.  Retta Diones 09/03/2013, 4:37 PM  I can be reached at [phone].

## 2013-09-03 NOTE — Progress Notes (Signed)
VASCULAR LAB PRELIMINARY  PRELIMINARY  PRELIMINARY  PRELIMINARY  Carotid duplex  completed.    Preliminary report:  Bilateral:  1-39% ICA stenosis.  Vertebral artery flow is antegrade.      Mavis Gravelle, RVT 09/03/2013, 10:26 AM

## 2013-09-03 NOTE — Evaluation (Signed)
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   Clinical Impression  On eval, pt required Mod assist +2 for bed mobility and standing. Demonstrates weakness, impaired sensation, impaired coordination,decreased activity tolerance and impaired gait and balance. Pt was Ind prior to admission. Feel pt would benefit from CIR for continued rehab to improve functional mobility and safety and to regain prior level of independence.     PT Assessment  Patient needs continued PT services    Follow Up Recommendations  CIR    Does the patient have the potential to tolerate intense rehabilitation      Barriers to Discharge        Equipment Recommendations   (to be determined)    Recommendations for Other Services Rehab consult;OT consult   Frequency Min 4X/week    Precautions / Restrictions Precautions Precautions: Fall Precaution Comments: R sided hemiparesis Restrictions Weight Bearing Restrictions: No   Pertinent Vitals/Pain Abdomen 5/10.       Mobility  Bed Mobility Overal bed mobility: Needs Assistance Bed Mobility: Supine to Sit;Sit to Supine Supine to sit: Mod assist;+2 for safety/equipment;+2 for physical assistance Sit to supine: Mod assist;+2 for safety/equipment;+2 for physical assistance General bed mobility comments: mod A x 2 for supine to sit:  sidelying more comfortable due to recent surgery. cues for technique and to protect RUE. Assist for trunk and bil LEs. Increased time. Poor awareness/control of R UE/LE.  Transfers Overall transfer level: Needs assistance Equipment used: Rolling walker (2 wheeled) Transfers: Sit  to/from Stand Sit to Stand: Mod assist;+2 safety/equipment;+2 physical assistance General transfer comment: assist to rise,stabilize, control descent, properly/safely position R LE for standing. Poor control/stabilization of R knee-both buckling and hyperextension noted with static standing. External assist from therapists to support/stabilize pt and R knee. Pt able to stand for ~20 seconds before needing to sit due to fatigue, dizziness, nausea.     Exercises     PT Diagnosis: Difficulty walking;Hemiplegia dominant side;Acute pain;Abnormality of gait  PT Problem List: Decreased strength;Decreased range of motion;Decreased activity tolerance;Decreased balance;Decreased mobility;Decreased safety awareness;Decreased knowledge of precautions;Decreased knowledge of use of DME;Decreased coordination;Impaired sensation;Pain PT Treatment Interventions: DME instruction;Gait training;Functional mobility training;Therapeutic activities;Therapeutic exercise;Patient/family education;Balance training;Neuromuscular re-education     PT Goals(Current goals can be found in the care plan section) Acute Rehab PT Goals Patient Stated Goal: recover and get back to being independent PT Goal Formulation: With patient/family Time For Goal Achievement: 09/17/13 Potential to Achieve Goals: Good  Visit Information  Last PT Received On: 09/03/13 Assistance Needed: +2 PT/OT/SLP Co-Evaluation/Treatment: Yes Reason for Co-Treatment: Complexity of the patient's impairments (multi-system involvement);For patient/therapist safety PT goals addressed during session: Mobility/safety with mobility;Balance OT goals addressed during session: ADL's and self-care;Strengthening/ROM History of Present Illness: 58 yo male s/p prostatectomy 2/20 for malignant neoplasm and 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        Prior [place] expects to  be discharged to:: Unsure Living Arrangements: Alone Additional Comments: pt lives alone.   Prior Function Level of Independence: Independent Communication Communication: No difficulties Dominant Hand: Right    Cognition  Cognition Arousal/Alertness: Awake/alert Behavior During Therapy: WFL for tasks  assessed/performed Overall Cognitive Status: Within Functional Limits for tasks assessed    Extremity/Trunk Assessment Upper Extremity Assessment Upper Extremity Assessment: Defer to OT evaluation RUE Deficits / Details: pt  has movement throughout but ataxia present.  Able to fully flex and extend fingers, of note, bil 5th digits have flexion contractures. shoulder, AAROM wfls--able to lift approximately 30 actively, elbow wfls but ataxic, supination/pronation wfls when supported.  wrist flexes and extends 30 degrees in gravity eliminated plane Lower Extremity Assessment Lower Extremity Assessment: RLE deficits/detail RLE Deficits / Details: hip flex 2/5, hip abd/add 2/5, knee ext 2+/5, DF/PF-hx of ankle fusion-0/5 RLE Sensation: decreased light touch;decreased proprioception RLE Coordination: decreased gross motor;decreased fine motor Cervical / Trunk Assessment Cervical / Trunk Assessment: Normal   Balance Balance Overall balance assessment: Needs assistance Sitting-balance support: Bilateral upper extremity supported;Single extremity supported;Feet supported Sitting balance-Leahy Scale: Fair Standing balance support: During functional activity;Bilateral upper extremity supported Standing balance-Leahy Scale: Poor  End of Session PT - End of Session Equipment Utilized During Treatment: Gait belt Activity Tolerance: Patient limited by fatigue (Limited by nausea, dizziness) Patient left: in bed;with call bell/phone within reach;with family/visitor present  GP     Weston Anna, MPT Pager: [phone]

## 2013-09-03 NOTE — Evaluation (Signed)
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  He will benefit from skilled OT to increase safety and independence with adls.  Prior to admission, pt was independent with all adls.  During eval, pt became dizzy and nauseas.  Will further r/o vestibular problem--suspect central although no nystagmus was noted during functional tasks.      OT Assessment  Patient needs continued OT Services    Follow Up Recommendations  CIR    Barriers to Discharge      Equipment Recommendations  3 in 1 bedside comode    Recommendations for Other Services    Frequency  Min 2X/week    Precautions / Restrictions Precautions Precautions: Fall Restrictions Weight Bearing Restrictions: No   Pertinent Vitals/Pain No c/o pain.  Sitting BP 138/84    ADL  Grooming: Set up;Wash/dry face (using LUE) Where Assessed - Grooming: Supine, head of bed up Upper Body Bathing: Minimal assistance Where Assessed - Upper Body Bathing: Unsupported sitting Lower Body Bathing: +2 Total assistance Lower Body Bathing: Patient Percentage: 30% Where Assessed - Lower Body Bathing: Supported sit to stand Upper Body Dressing: Moderate assistance Where Assessed - Upper Body Dressing: Unsupported sitting Lower Body Dressing: +2 Total assistance Lower Body Dressing: Patient Percentage: 10% Where Assessed - Lower Body Dressing: Supported sit to stand Equipment Used: Rolling walker Transfers/Ambulation Related to ADLs: pt sat unsupported eob--c/o dizziness with spinning--likely central but  will further test on future visit.  Able to stand with A x 2, one person stabilizing R knee.  cues to attend to RUE placement with all mobility ADL Comments: pt is unable to use RUE functionally due to ataxia/decreased strength.  Educated to perform opposition and gave ball to squeeze.  Will try functional activities with weightbearing/closed chain to limit ataxic movement    OT Diagnosis: Hemiplegia dominant side  OT Problem List: Decreased strength;Decreased activity tolerance;Impaired balance (sitting and/or standing);Decreased coordination;Decreased safety awareness;Impaired UE functional use;Pain;Impaired sensation OT Treatment Interventions: Self-care/ADL training;Therapeutic exercise;Neuromuscular education;Therapeutic activities;Patient/family education;Balance training   OT Goals(Current goals can be found in the care plan section) Acute Rehab OT Goals Patient Stated Goal: recover and get back to being independent OT Goal Formulation: With patient Time For Goal Achievement: 09/17/13 Potential to Achieve Goals: Good ADL Goals Pt Will Transfer to Toilet: with min assist;stand pivot transfer;squat pivot transfer;bedside commode Additional ADL Goal #1: pt will maintain static standing with min guard for 2 minutes for adls Additional ADL Goal #2: pt will perform RUE distal strengthening program and bil coordination with set up Additional ADL Goal #3: pt will protect RUE during bed mobility and scooting without cues Additional ADL Goal #4: pt will perform ub adls with set up unsupported sitting  Visit Information  Last OT Received On: 09/03/13 Assistance Needed: +2 PT/OT/SLP Co-Evaluation/Treatment: Yes Reason for Co-Treatment: Complexity of the patient's impairments (multi-system involvement);For patient/therapist safety OT goals addressed during session: ADL's and self-care;Strengthening/ROM History of Present Illness: pt was admitted for prostatectomy for malignant neoplasm and  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   Prior Function Level of Independence: Independent Communication Communication: No difficulties Dominant Hand: Right         Vision/Perception     Cognition  Cognition Arousal/Alertness: Awake/alert Behavior During Therapy: WFL for tasks assessed/performed Overall Cognitive Status: Within Functional Limits for tasks assessed    Extremity/Trunk Assessment Upper Extremity Assessment Upper Extremity Assessment: RUE deficits/detail RUE Deficits / Details: pt  has movement throughout but ataxia present.  Able to fully flex and extend fingers, of note, bil 5th digits have flexion contractures. shoulder, AAROM wfls--able to lift approximately 30 actively, elbow wfls but ataxic, supination/pronation wfls when supported.  wrist flexes and extends 30 degrees in gravity eliminated plane Pt has decreased sensation in R thumb--able to feel something but not normal.      Mobility Bed Mobility Overal bed mobility: +2 for physical assistance General bed mobility comments: mod A x 2 for supine to sit:  sidelying more comfortable due to recent surgery. cues for technique and to protect RUE Transfers Overall transfer level: Needs assistance Equipment used: Rolling walker (2 wheeled) Transfers: Sit to/from Stand Sit to Stand: +2 physical assistance;Mod assist General transfer comment: assist to rise and stabilize and control R knee     Exercise     Balance     End of Session OT - End of Session Activity Tolerance: Patient limited by fatigue (spinning/nausea) Patient left: in bed;with call bell/phone within reach;with family/visitor present  [place] 09/03/2013, 3:47 PM Lesle Chris, OTR/L [phone] 09/03/2013

## 2013-09-04 NOTE — Progress Notes (Signed)
Urology Progress Note  Subjective:     Has improving strength in RUE. Ambulating, working w/ PT/OT.  No acute urologic events overnight. Nausea resolved. Tolerating regular diet. Positive BM and flatus.      ROS: Negative: Chest pain or SOB.  Objective:  Patient Vitals for the past 24 hrs:  BP Temp Temp src Pulse Resp SpO2  09/04/13 0800 - 97.3 F (36.3 C) - - - -  09/04/13 0501 149/84 mmHg 98.2 F (36.8 C) Oral 86 16 93 %  09/03/13 2038 137/79 mmHg 98.7 F (37.1 C) Oral 88 18 96 %    Physical Exam: General:  No acute distress, awake Cardiovascular:    [x]   S1/S2 present, RRR  []   Irregularly irregular Chest:  CTA-B Abdomen:               []  Soft, appropriately TTP  []  Soft, NTTP  [x]  Soft, appropriately TTP, incision(s) clean/dry/intact  Genitourinary: Negative edema. Urine clear yellow. Neuro: CN 2-12 intact. RUE & RLE weak proximally & distally along w/ paresthesia.      I/O last 3 completed shifts: In: -  Out: 2250 [Urine:2250]  Recent Labs     09/02/13  1716  HGB  10.6*  WBC  6.6  PLT  138*    Recent Labs     09/02/13  1716  NA  131*  K  3.9  CL  91*  CO2  24  BUN  9  CREATININE  0.99  CALCIUM  8.5  GFRNONAA  >90  GFRAA  >90     Recent Labs     09/02/13  1716  INR  1.01  APTT  27     No components found with this basename: ABG,     Length of stay: 6 days.  Assessment: Prostate cancer. POD#6 Robotic radical prostatectomy with bilateral pelvic lymph node dissection and lysis of colonic adhesions.   Plan: OK for discharge home from prostate cancer/ GU point of view.  Appreciate help from Neurology.  Disposition- awaiting [facility].   Rolan Bucco, MD [phone]

## 2013-09-04 NOTE — Progress Notes (Signed)
NEURO HOSPITALIST PROGRESS NOTE   SUBJECTIVE:                                                                                                                        Patient continues to have right arm and leg weakness with not much improvement.  He is very eager to get improvement. No further complaints.   OBJECTIVE:                                                                                                                           Vital signs in last 24 hours: Temp:  [98.2 F (36.8 C)-98.7 F (37.1 C)] 98.2 F (36.8 C) (02/26 0501) Pulse Rate:  [83-88] 86 (02/26 0501) Resp:  [16-18] 16 (02/26 0501) BP: (134-149)/(79-84) 149/84 mmHg (02/26 0501) SpO2:  [93 %-96 %] 93 % (02/26 0501)  Intake/Output from previous day: 02/25 0701 - 02/26 0700 In: -  Out: 1600 [Urine:1600] Intake/Output this shift:   Nutritional status: General  Past Medical History  Diagnosis Date  . Alcoholic hepatitis   . Hypertension   . 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 Shortness of breath     new onset- occasionally-at rest and exertion  . GERD (gastroesophageal reflux disease)   . Anxiety     Neurologic Exam:  Mental Status:  Alert, oriented, thought content appropriate. Speech fluent without evidence of aphasia. Able to follow 3 step commands without difficulty.  Cranial Nerves:  II: Visual fields grossly normal, pupils equal, round, reactive to light and accommodation  III,IV, VI: ptosis not present, extra-ocular motions intact bilaterally  V,VII: smile symmetric, facial light touch sensation normal bilaterally  VIII: hearing normal bilaterally  IX,X: gag reflex present  XI: bilateral shoulder shrug  XII: midline tongue extension without atrophy or fasciculations  Motor:  Right :  Upper extremity 4/5  Left:  Upper extremity 5/5   Lower extremity 4/5   Lower extremity 5/5  Tone and bulk:normal tone throughout; no atrophy noted  Sensory:  Pinprick and light touch intact throughout, bilaterally with exception of the right ankle secondary to previous surgery  Deep Tendon Reflexes:  2+ throughout with 1+ at ankles  Plantars:  Right: upgoing  Left: downgoing  Cerebellar:  normal finger-to-nose on the left but ataxic on the right , normal heel-to-shin test on the left but ataxic on the right  Gait: not tested  CV: pulses palpable throughout    Lab Results: Basic Metabolic Panel:  Recent Labs Lab 08/30/13 0440 08/31/13 1558 09/02/13 1716  NA 136* 131* 131*  K 5.6* 4.3 3.9  CL 100 96 91*  CO2 27 25 24   GLUCOSE 104* 100* 94  BUN 14 8 9   CREATININE 1.18 1.02 0.99  CALCIUM 8.4 8.3* 8.5    Liver Function Tests: No results found for this basename: AST, ALT, ALKPHOS, BILITOT, PROT, ALBUMIN,  in the last 168 hours No results found for this basename: LIPASE, AMYLASE,  in the last 168 hours No results found for this basename: AMMONIA,  in the last 168 hours  CBC:  Recent Labs Lab 08/29/13 1735 08/30/13 0440 08/31/13 1558 09/02/13 1716  WBC  --   --   --  6.6  HGB 11.7* 10.9* 10.3* 10.6*  HCT 34.0* 33.2* 30.9* 31.6*  MCV  --   --   --  97.2  PLT  --   --   --  138*    Cardiac Enzymes:  Recent Labs Lab 09/02/13 1716  CKTOTAL 52  CKMB 1.0    Lipid Panel:  Recent Labs Lab 09/03/13 0020  CHOL 146  TRIG 148  HDL 24*  CHOLHDL 6.1  VLDL 30  LDLCALC 92    CBG: No results found for this basename: GLUCAP,  in the last 168 hours  Microbiology: Results for orders placed during the hospital encounter of 12/09/12  MRSA PCR SCREENING     Status: None   Collection Time    12/10/12  5:36 AM      Result Value Ref Range Status   MRSA by PCR NEGATIVE  NEGATIVE Final   Comment:            The GeneXpert MRSA Assay (FDA     approved for NASAL specimens     only), is one component of a     comprehensive MRSA colonization     surveillance program. It is not     intended to diagnose MRSA     infection nor  to guide or     monitor treatment for     MRSA infections.    Coagulation Studies:  Recent Labs  09/02/13 1716  LABPROT 13.1  INR 1.01    Imaging: Ct Head Wo Contrast  09/02/2013   CLINICAL DATA:  Acute onset right-side weakness.  EXAM: CT HEAD WITHOUT CONTRAST  TECHNIQUE: Contiguous axial images were obtained from the base of the skull through the vertex without intravenous contrast.  COMPARISON:  None.  FINDINGS: There is no evidence of acute abnormality including infarction,hemorrhage, midline shift or abnormal extra-axial fluid collection. No hydrocephalus or pneumocephalus. The calvarium is intact. Imaged paranasal sinuses and mastoid air cells are clear. Cortical atrophy is noted.  IMPRESSION: No acute abnormality.   Electronically Signed   By: Inge Rise M.D.   On: 09/02/2013 15:19   Mr Brain Wo Contrast  09/02/2013   CLINICAL DATA:  Acute onset of right-sided weakness. Prostate cancer.  EXAM: MRI HEAD WITHOUT CONTRAST  MRA HEAD WITHOUT CONTRAST  TECHNIQUE: Multiplanar, multiecho pulse sequences of the brain and surrounding structures were obtained without intravenous contrast. Angiographic images of the head were obtained using MRA technique without contrast.  COMPARISON:  09/02/2013 head CT.  No comparison brain MR.  FINDINGS: MRI HEAD FINDINGS  Acute nonhemorrhagic infarct extends from the posterior left lenticular nucleus to posterior left coronal radiata.  No intracranial hemorrhage.  Mild small vessel disease type changes.  Mild atrophy without hydrocephalus.  No intracranial mass lesion noted on this unenhanced exam.  Slightly heterogeneous bone marrow of the clivus and dens but without obvious bony destructive lesion.  MRA HEAD FINDINGS  Small irregular left anterior cerebral artery.  Middle cerebral artery branch vessel irregularity greater on left.  Fetal type contribution to the posterior cerebral artery bilaterally.  Prominent right posterior communicating artery  infundibulum versus small aneurysm (bordering 3 mm maximal dimension).  Additionally, suggestion of tiny aneurysm of the right internal carotid artery cavernous segment (approximately 1.2 mm).  Ectatic vertebral arteries and basilar artery. No high-grade stenosis.  Moderate narrowing origin of the right posterior inferior cerebellar artery.  Non visualization left anterior inferior cerebellar artery.  Non visualization right anterior inferior cerebellar artery.  Moderate narrowing proximal right superior cerebellar artery.  Posterior cerebral artery branch vessel irregularity more notable on the left.  IMPRESSION: MRI HEAD:  Acute nonhemorrhagic infarct extends from the posterior left lenticular nucleus to posterior left coronal radiata.  MRA HEAD:  Small irregular left anterior cerebral artery.  Middle cerebral artery branch vessel irregularity greater on left.  Prominent right posterior communicating artery infundibulum versus small aneurysm (bordering 3 mm maximal dimension).  Suggestion of tiny aneurysm of the right internal carotid artery cavernous segment (approximately 1.2 mm).  Moderate narrowing origin of the right posterior inferior cerebellar artery.  Non visualization left anterior inferior cerebellar artery.  Non visualization right anterior inferior cerebellar artery.  Moderate narrowing proximal right superior cerebellar artery.  Posterior cerebral artery branch vessel irregularity more notable on the left.  These results were called by telephone at the time of interpretation on 09/02/2013 at 8:59 PM to [facility], patient's nurse , who verbally acknowledged these results.   Electronically Signed   By: Chauncey Cruel M.D.   On: 09/02/2013 21:03   Mr Cervical Spine Wo Contrast  09/02/2013   CLINICAL DATA:  Right-sided weakness.  Prostate cancer.  EXAM: MRI CERVICAL SPINE WITHOUT CONTRAST  TECHNIQUE: Multiplanar, multisequence MR imaging was performed. No intravenous contrast was administered.  COMPARISON:   MR brain performed same time.  FINDINGS: Exam is motion degraded.  No osseous lesion to suggest sclerotic metastatic disease.  Cervical cord without focal signal abnormality.  Visualized paravertebral structures without gross abnormality.  C2-3:  Negative.  C3-4:  Minimal bulge.  C4-5:  Minimal bulge.  C5-6: Minimal bulge. Minimal uncinate hypertrophy. Minimal foraminal narrowing.  C6-7: Minimal bulge. Minimal uncinate hypertrophy. Minimal foraminal narrowing.  C7-T1:  Minimal Schmorl's node deformity superior endplate T1.  IMPRESSION: No evidence of cervical disc herniation.   Electronically Signed   By: Chauncey Cruel M.D.   On: 09/02/2013 21:22       MEDICATIONS  Scheduled: . aspirin EC  81 mg Oral Daily  . bisacodyl  10 mg Rectal BID  . clonazePAM  0.5 mg Oral Daily  . doxazosin  8 mg Oral Daily  . folic acid  1 mg Oral Daily  . furosemide  20 mg Oral Daily  . heparin subcutaneous  5,000 Units Subcutaneous Q8H  . lisinopril  40 mg Oral Daily  . metoprolol tartrate  50 mg Oral BID  . multivitamin with minerals  1 tablet Oral Daily  . pantoprazole  40 mg Oral BID  . senna-docusate  1 tablet Oral BID  . thiamine  100 mg Oral Daily   Or  . thiamine  100 mg Intravenous Daily   2 D echo: Study Conclusions  Left ventricle: The cavity size was normal. Systolic function was normal. The estimated ejection fraction was in the range of 60% to 65%. Wall motion was normal; there were no regional wall motion abnormalities. There was an increased relative contribution of atrial contraction to ventricular filling.   VASCULAR LAB  PRELIMINARY PRELIMINARY PRELIMINARY PRELIMINARY  Carotid duplex completed.  Preliminary report: Bilateral: 1-39% ICA stenosis. Vertebral artery flow is antegrade  A1c 5.0 LDL 92 ASSESSMENT/PLAN:                                                                                                              58 YO male with acute left corona radiata infarct and resultant right arm and leg weakness.   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  2) Continue ASA 81 mg daily 3) Recommend low dose statin  4) PT/OT has recommended CIR 5) Follow up with out patient neurology in 4 weeks--[facility] [address] [place], [place] [address]--[phone] or  [facility] neurology [place], [place], [place] [address] [phone]  Neurology will S/O  Assessment and plan discussed with with attending physician and they are in agreement.    Etta Quill PA-C [facility] [phone]  09/04/2013, 8:59 AM   Patient seen and examined together with physician assistant and I concur with the assessment and plan.  Dorian Pod, MD

## 2013-09-04 NOTE — Progress Notes (Signed)
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    OT comments  Pt is an excellent candidate for CIR.  He is making good progress in OT  Follow Up Recommendations  CIR    Barriers to Discharge       Equipment Recommendations  3 in 1 bedside comode    Recommendations for Other Services    Frequency Min 4X/week   Progress towards OT Goals Progress towards OT goals: Progressing toward goals  Plan Discharge plan remains appropriate    Precautions / Restrictions Precautions Precautions: Fall Precaution Comments: R sided hemiparesis Restrictions Weight Bearing Restrictions: No   Pertinent Vitals/Pain LLE intermittent cramping.  Nausea when sitting eob:  RN brought meds    ADL  Grooming: Wash/dry hands;Wash/dry face;Performed (used R hand chin; used R as gross assist for lotion) Upper Body Bathing: Minimal assistance Where Assessed - Upper Body Bathing: Supine, head of bed up;Unsupported sitting (used L and R.  Assist for more pressure over L arm) Upper Body Dressing: Moderate assistance Where Assessed - Upper Body Dressing: Unsupported sitting Toilet Transfer: Simulated;+2 Total assistance Toilet Transfer: Patient Percentage: 60% Toilet Transfer Method: Squat pivot Transfers/Ambulation Related to ADLs: sat eob x 10 min with unilateral to bil support with supervision.  +2 for safety with squat pivot transfer to L side to recliner ADL Comments: cues needed to protect RUe during movements.  cues to use this especially with bil activities.  Used tray table for AAROM for elbow and supination.  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     Subjective Data      Prior Functioning       Cognition  Cognition Arousal/Alertness: Awake/alert Behavior During Therapy: WFL for tasks assessed/performed Overall Cognitive Status: Within Functional Limits for tasks assessed    Mobility  Bed Mobility Supine to sit: Mod assist;HOB elevated General bed mobility comments: used HOB up to minimize strain on abdomen.  Pt with blister from tape on L side and wasn't sure he could comfortably roll to this side Transfers Transfers: Squat Pivot Transfers Sit to Stand: Min assist;Mod assist;+2 physical assistance General transfer comment: for safety, second person    Exercises  Other Exercises Other Exercises: 10 reps aarom:  table top for elbow flexion/extension and supination/pronation.  assist of therapist for shoulder flexion/extension   Balance    End of Session OT - End of Session Activity Tolerance: Patient tolerated treatment well (initially nauseas when sitting up)  GO     Hoa Briggs 09/04/2013, 9:17 AM Lesle Chris, OTR/L [phone] 09/04/2013

## 2013-09-04 NOTE — Progress Notes (Signed)
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    PT Comments   Pt OOB in recliner. Applied KI to R LE to support knee for gait.  Used EVA walker for increased support R UE and trunk.  Assisted + 2 for amb and transfers. Pt would greatly benefit from CIR.  Follow Up Recommendations  CIR     Does the patient have the potential to tolerate intense rehabilitation     Barriers to Discharge        Equipment Recommendations       Recommendations for Other Services    Frequency     Progress towards PT Goals Progress towards PT goals: Progressing toward goals  Plan      Precautions / Restrictions Precautions Precautions: Fall Precaution Comments: R sided hemiparesis Restrictions Weight Bearing Restrictions: No   Pertinent Vitals/Pain C/o ABD pain (from surgery) 5/10    Mobility  Bed Mobility General bed mobility comments: Pt OOB in recliner Transfers Overall transfer level: Needs assistance Equipment used:  (EVA walker) Transfers: Sit to/from Stand Sit to Stand: +2 physical assistance;Mod assist General transfer comment: 50% VC's on proper tech and hand placement.  Extra assist with stand to sit for control.  Ambulation/Gait Ambulation/Gait assistance: +2 physical assistance;Max assist Ambulation Distance (Feet): 34 Feet Assistive device:  (EVA walker) Gait Pattern/deviations: Step-to pattern;Decreased stance time - right;Narrow base of support;Antalgic;Staggering right Gait velocity: decreased General Gait Details: Used EVA walker due to R UE weakness and R KI applied for knee buckling. very unsteady gait requiring tactile cuefor  upright posture and proper R LE placement to increase ABd.     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     Subjective Data      Cognition       Balance     End of Session PT - End of Session Equipment Utilized During Treatment: Gait belt Activity Tolerance: Patient limited by fatigue;Treatment limited secondary to medical complications (Comment) (R hemiparesis) Patient left: in chair;with call bell/phone within reach   Rica Koyanagi  PTA [facility]  Acute  Rehab Pager      [phone]

## 2013-09-05 ENCOUNTER — Inpatient Hospital Stay (HOSPITAL_COMMUNITY)
Admission: RE | Admit: 2013-09-05 | Discharge: 2013-09-10 | DRG: 945 | Disposition: A | Discharge status: Other Acute Inpatient Hospital | Payer: Medicaid Other | Source: Intra-hospital | Attending: Physical Medicine & Rehabilitation | Admitting: Physical Medicine & Rehabilitation

## 2013-09-05 DIAGNOSIS — G8110 Spastic hemiplegia affecting unspecified side: Secondary | ICD-10-CM

## 2013-09-05 DIAGNOSIS — K5730 Diverticulosis of large intestine without perforation or abscess without bleeding: Secondary | ICD-10-CM | POA: Diagnosis present

## 2013-09-05 DIAGNOSIS — Z800 Family history of malignant neoplasm of digestive organs: Secondary | ICD-10-CM

## 2013-09-05 DIAGNOSIS — K929 Disease of digestive system, unspecified: Secondary | ICD-10-CM | POA: Diagnosis present

## 2013-09-05 DIAGNOSIS — N390 Urinary tract infection, site not specified: Secondary | ICD-10-CM

## 2013-09-05 DIAGNOSIS — K567 Ileus, unspecified: Secondary | ICD-10-CM | POA: Diagnosis not present

## 2013-09-05 DIAGNOSIS — E876 Hypokalemia: Secondary | ICD-10-CM

## 2013-09-05 DIAGNOSIS — I639 Cerebral infarction, unspecified: Secondary | ICD-10-CM | POA: Diagnosis present

## 2013-09-05 DIAGNOSIS — Z801 Family history of malignant neoplasm of trachea, bronchus and lung: Secondary | ICD-10-CM

## 2013-09-05 DIAGNOSIS — E871 Hypo-osmolality and hyponatremia: Secondary | ICD-10-CM | POA: Diagnosis present

## 2013-09-05 DIAGNOSIS — Y836 Removal of other organ (partial) (total) as the cause of abnormal reaction of the patient, or of later complication, without mention of misadventure at the time of the procedure: Secondary | ICD-10-CM | POA: Diagnosis present

## 2013-09-05 DIAGNOSIS — R112 Nausea with vomiting, unspecified: Secondary | ICD-10-CM

## 2013-09-05 DIAGNOSIS — I10 Essential (primary) hypertension: Secondary | ICD-10-CM | POA: Diagnosis present

## 2013-09-05 DIAGNOSIS — I6330 Cerebral infarction due to thrombosis of unspecified cerebral artery: Secondary | ICD-10-CM | POA: Diagnosis present

## 2013-09-05 DIAGNOSIS — K9189 Other postprocedural complications and disorders of digestive system: Secondary | ICD-10-CM

## 2013-09-05 DIAGNOSIS — K560 Paralytic ileus: Secondary | ICD-10-CM | POA: Diagnosis present

## 2013-09-05 DIAGNOSIS — C61 Malignant neoplasm of prostate: Secondary | ICD-10-CM | POA: Diagnosis present

## 2013-09-05 DIAGNOSIS — R29898 Other symptoms and signs involving the musculoskeletal system: Secondary | ICD-10-CM | POA: Diagnosis present

## 2013-09-05 DIAGNOSIS — K219 Gastro-esophageal reflux disease without esophagitis: Secondary | ICD-10-CM | POA: Diagnosis present

## 2013-09-05 DIAGNOSIS — Z5189 Encounter for other specified aftercare: Principal | ICD-10-CM

## 2013-09-05 DIAGNOSIS — F1020 Alcohol dependence, uncomplicated: Secondary | ICD-10-CM | POA: Diagnosis present

## 2013-09-05 DIAGNOSIS — K7010 Alcoholic hepatitis without ascites: Secondary | ICD-10-CM | POA: Diagnosis present

## 2013-09-05 DIAGNOSIS — R933 Abnormal findings on diagnostic imaging of other parts of digestive tract: Secondary | ICD-10-CM

## 2013-09-05 DIAGNOSIS — F411 Generalized anxiety disorder: Secondary | ICD-10-CM | POA: Diagnosis present

## 2013-09-05 LAB — CBC
HCT: 31.1 % — ABNORMAL LOW (ref 39.0–52.0)
Hemoglobin: 10.9 g/dL — ABNORMAL LOW (ref 13.0–17.0)
MCH: 33.7 pg (ref 26.0–34.0)
MCHC: 35 g/dL (ref 30.0–36.0)
MCV: 96.3 fL (ref 78.0–100.0)
Platelets: 185 10*3/uL (ref 150–400)
RBC: 3.23 MIL/uL — ABNORMAL LOW (ref 4.22–5.81)
RDW: 12.2 % (ref 11.5–15.5)
WBC: 7.5 10*3/uL (ref 4.0–10.5)

## 2013-09-05 LAB — CREATININE, SERUM
CREATININE: 1 mg/dL (ref 0.50–1.35)
GFR calc Af Amer: >90 mL/min (ref >90)
GFR calc non Af Amer: 83 mL/min — ABNORMAL LOW (ref >90)

## 2013-09-05 LAB — GLUCOSE, CAPILLARY: Glucose-Capillary: 94 mg/dL (ref 70–99)

## 2013-09-05 MED ORDER — ASPIRIN 81 MG PO TBEC: 81.0000 mg | DELAYED_RELEASE_TABLET | Freq: Every day | ORAL | Status: DC

## 2013-09-05 MED ORDER — THIAMINE HCL 100 MG/ML IJ SOLN
100.0000 mg | Freq: Every day | INTRAMUSCULAR | Status: DC
  Filled 2013-09-05 (×2): qty 1

## 2013-09-05 MED ORDER — VITAMIN B-1 100 MG PO TABS
100.0000 mg | ORAL_TABLET | Freq: Every day | ORAL | Status: DC
Start: 2013-09-06 — End: 2013-09-06
  Administered 2013-09-06: 100 mg via ORAL
  Filled 2013-09-05 (×2): qty 1

## 2013-09-05 MED ORDER — SORBITOL 70 % SOLN: 30.0000 mL | Freq: Every day | Status: DC | PRN

## 2013-09-05 MED ORDER — ONDANSETRON HCL 4 MG/2ML IJ SOLN
4.0000 mg | Freq: Four times a day (QID) | INTRAMUSCULAR | Status: DC | PRN
  Administered 2013-09-06 – 2013-09-08 (×6): 4 mg via INTRAVENOUS
  Filled 2013-09-05 (×6): qty 2

## 2013-09-05 MED ORDER — OXYBUTYNIN CHLORIDE 5 MG PO TABS
5.0000 mg | ORAL_TABLET | Freq: Four times a day (QID) | ORAL | Status: DC | PRN
  Filled 2013-09-05: qty 1

## 2013-09-05 MED ORDER — ASPIRIN EC 81 MG PO TBEC
81.0000 mg | DELAYED_RELEASE_TABLET | Freq: Every day | ORAL | Status: DC
  Administered 2013-09-06 – 2013-09-10 (×4): 81 mg via ORAL
  Filled 2013-09-05 (×6): qty 1

## 2013-09-05 MED ORDER — HYOSCYAMINE SULFATE 0.125 MG SL SUBL
0.1250 mg | SUBLINGUAL_TABLET | SUBLINGUAL | Status: DC | PRN
  Filled 2013-09-05: qty 1

## 2013-09-05 MED ORDER — CLONAZEPAM 0.5 MG PO TABS
0.5000 mg | ORAL_TABLET | Freq: Every day | ORAL | Status: DC
  Administered 2013-09-06 – 2013-09-10 (×4): 0.5 mg via ORAL
  Filled 2013-09-05 (×6): qty 1

## 2013-09-05 MED ORDER — ONDANSETRON HCL 4 MG PO TABS
4.0000 mg | ORAL_TABLET | Freq: Four times a day (QID) | ORAL | Status: DC | PRN
  Administered 2013-09-05: 4 mg via ORAL
  Filled 2013-09-05 (×2): qty 1

## 2013-09-05 MED ORDER — HEPARIN SODIUM (PORCINE) 5000 UNIT/ML IJ SOLN
5000.0000 [IU] | Freq: Three times a day (TID) | INTRAMUSCULAR | Status: DC
  Administered 2013-09-05 – 2013-09-10 (×14): 5000 [IU] via SUBCUTANEOUS
  Filled 2013-09-05 (×17): qty 1

## 2013-09-05 MED ORDER — ONDANSETRON HCL 4 MG PO TABS
4.0000 mg | ORAL_TABLET | Freq: Four times a day (QID) | ORAL | Status: DC | PRN
Start: 2013-09-05 — End: 2013-09-05

## 2013-09-05 MED ORDER — METOPROLOL TARTRATE 50 MG PO TABS
50.0000 mg | ORAL_TABLET | Freq: Two times a day (BID) | ORAL | Status: DC
  Administered 2013-09-05 – 2013-09-06 (×2): 50 mg via ORAL
  Filled 2013-09-05 (×6): qty 1

## 2013-09-05 MED ORDER — BISACODYL 10 MG RE SUPP
10.0000 mg | Freq: Two times a day (BID) | RECTAL | Status: DC
  Administered 2013-09-07: 10 mg via RECTAL
  Filled 2013-09-05 (×3): qty 1

## 2013-09-05 MED ORDER — ONDANSETRON HCL 4 MG PO TABS
4.0000 mg | ORAL_TABLET | ORAL | Status: DC | PRN
  Administered 2013-09-05: 4 mg via ORAL

## 2013-09-05 MED ORDER — MORPHINE SULFATE 15 MG PO TABS
15.0000 mg | ORAL_TABLET | ORAL | Status: DC | PRN
  Administered 2013-09-05 – 2013-09-06 (×5): 15 mg via ORAL
  Filled 2013-09-05 (×7): qty 1

## 2013-09-05 MED ORDER — DOXAZOSIN MESYLATE 8 MG PO TABS
8.0000 mg | ORAL_TABLET | Freq: Every day | ORAL | Status: DC
  Administered 2013-09-06 – 2013-09-10 (×4): 8 mg via ORAL
  Filled 2013-09-05 (×7): qty 1

## 2013-09-05 MED ORDER — FUROSEMIDE 20 MG PO TABS
20.0000 mg | ORAL_TABLET | Freq: Every day | ORAL | Status: DC
  Administered 2013-09-06: 20 mg via ORAL
  Filled 2013-09-05 (×2): qty 1

## 2013-09-05 MED ORDER — SENNOSIDES-DOCUSATE SODIUM 8.6-50 MG PO TABS
1.0000 | ORAL_TABLET | Freq: Two times a day (BID) | ORAL | Status: DC
  Administered 2013-09-05 – 2013-09-10 (×7): 1 via ORAL
  Filled 2013-09-05 (×15): qty 1

## 2013-09-05 MED ORDER — BELLADONNA ALKALOIDS-OPIUM 16.2-60 MG RE SUPP: 1.0000 | Freq: Four times a day (QID) | RECTAL | Status: DC | PRN

## 2013-09-05 MED ORDER — FOLIC ACID 1 MG PO TABS
1.0000 mg | ORAL_TABLET | Freq: Every day | ORAL | Status: DC
  Filled 2013-09-05 (×2): qty 1

## 2013-09-05 MED ORDER — PANTOPRAZOLE SODIUM 40 MG PO TBEC
40.0000 mg | DELAYED_RELEASE_TABLET | Freq: Two times a day (BID) | ORAL | Status: DC
  Administered 2013-09-05 – 2013-09-06 (×2): 40 mg via ORAL
  Filled 2013-09-05 (×4): qty 1

## 2013-09-05 MED ORDER — BACITRACIN-NEOMYCIN-POLYMYXIN OINTMENT TUBE
TOPICAL_OINTMENT | Freq: Three times a day (TID) | CUTANEOUS | Status: DC | PRN
  Filled 2013-09-05: qty 15

## 2013-09-05 MED ORDER — ACETAMINOPHEN 325 MG PO TABS
325.0000 mg | ORAL_TABLET | ORAL | Status: DC | PRN
Start: 2013-09-05 — End: 2013-09-10
  Filled 2013-09-05: qty 2

## 2013-09-05 MED ORDER — LISINOPRIL 40 MG PO TABS
40.0000 mg | ORAL_TABLET | Freq: Every day | ORAL | Status: DC
  Administered 2013-09-06: 40 mg via ORAL
  Filled 2013-09-05 (×2): qty 1

## 2013-09-05 MED ORDER — ONDANSETRON HCL 4 MG/2ML IJ SOLN: 4.0000 mg | Freq: Four times a day (QID) | INTRAMUSCULAR | Status: DC | PRN

## 2013-09-05 MED ORDER — ADULT MULTIVITAMIN W/MINERALS CH
1.0000 | ORAL_TABLET | Freq: Every day | ORAL | Status: DC
  Administered 2013-09-06: 1 via ORAL
  Filled 2013-09-05 (×2): qty 1

## 2013-09-05 MED ORDER — HEPARIN SODIUM (PORCINE) 5000 UNIT/ML IJ SOLN: 5000.0000 [IU] | Freq: Three times a day (TID) | INTRAMUSCULAR | Status: DC

## 2013-09-05 MED ORDER — BACITRACIN-NEOMYCIN-POLYMYXIN 400-5-5000 EX OINT: 1.0000 "application " | TOPICAL_OINTMENT | Freq: Three times a day (TID) | CUTANEOUS | Status: DC | PRN

## 2013-09-05 NOTE — Progress Notes (Signed)
Patient ID: Tanner [name], male   DOB: 03-23-1956, 58 y.o.   MRN: [id number] Pt admitted from [facility] to 4M01. Admission vital signs are stable

## 2013-09-05 NOTE — Progress Notes (Signed)
Urology Progress Note  Subjective:     Continues to have improving strength in RUE and right hand grip is improving.   Continues ambulating & working w/ PT/OT.  No acute urologic events overnight. Nausea resolved. Tolerating regular diet. Positive BM and flatus.      ROS: Negative: Chest pain or SOB.  Objective:  Patient Vitals for the past 24 hrs:  BP Temp Temp src Pulse Resp SpO2  09/05/13 0610 118/71 mmHg 98.6 F (37 C) Oral 87 18 92 %  09/04/13 2125 131/77 mmHg 98.1 F (36.7 C) Oral 87 18 93 %  09/04/13 1500 136/79 mmHg 98.5 F (36.9 C) Oral 84 16 97 %    Physical Exam: General:  No acute distress, awake Cardiovascular:    [x]   S1/S2 present, RRR  []   Irregularly irregular Chest:  CTA-B Abdomen:               []  Soft, appropriately TTP  []  Soft, NTTP  [x]  Soft, appropriately TTP, incision(s) clean/dry/intact  Genitourinary: Negative edema. Urine clear yellow. Neuro: CN 2-12 intact. RUE & RLE weak proximally & distally along w/ paresthesia.      I/O last 3 completed shifts: In: 840 [P.O.:840] Out: 1750 [Urine:1750]  Recent Labs     09/02/13  1716  HGB  10.6*  WBC  6.6  PLT  138*    Recent Labs     09/02/13  1716  NA  131*  K  3.9  CL  91*  CO2  24  BUN  9  CREATININE  0.99  CALCIUM  8.5  GFRNONAA  >90  GFRAA  >90     Recent Labs     09/02/13  1716  INR  1.01  APTT  27     No components found with this basename: ABG,     Length of stay: 7 days.  Assessment: Prostate cancer. POD#7 Robotic radical prostatectomy with bilateral pelvic lymph node dissection and lysis of colonic adhesions.   Plan: OK for discharge home from prostate cancer/ GU point of view.  Disposition- awaiting [facility].   Rolan Bucco, MD [phone]

## 2013-09-05 NOTE — PMR Pre-admission (Signed)
PMR Admission Coordinator Pre-Admission Assessment  Patient: Tanner [name] is an 58 y.o., male MRN: [id number] DOB: January 30, 1956 Height: 5\' 10"  (177.8 cm) Weight: 83.9 kg (184 lb 15.5 oz)              Insurance Information HMO:     PPO:      PCP:      IPA:      80/20:      OTHER:  PRIMARY: medicaid [place] Access      Policy#: [id number]      Subscriber: pt Active 09/05/13   Disability Application Date: disabled since 12/2011 per pt due to scoliosis      Medicare pending  Emergency Contact Information Contact Information   Name Relation Home Work Mobile   [name],Tanner Sister [phone]  [phone]   Tanner [name] Sister [phone]     [facility] Daughter [phone]     Tanner [name]   [phone]     Current Medical History  Patient Admitting Diagnosis: 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 right-handed male with history of hypertension as well as alcoholic hepatitis. Admitted 08/29/2012 with workup for recent prostate cancer followed by urology services and underwent robotic-assisted laparoscopic radical prostatectomy 08/29/2013 per Dr. Jasmine December. Presented with new onset of right side weakness on the day of d/c.  MRI of the brain showed acute nonhemorrhagic infarct extending from posterior left lenticular nucleus to posterior left corona radiata. Incidental finding of right ICA aneurysm with planned workup as outpatient. Echocardiogram with ejection fraction of 65% and no wall motion abnormalities. Carotid Dopplers with no ICA stenosis. Patient did not receive TPA. Neurology services consulted maintained on aspirin therapy for CVA prophylaxis as well as subcutaneous heparin for DVT prophylaxis. Patient is tolerating a regular diet. Followup urology services in reference to radical prostatectomy advised to keep Foley catheter tube x10 days and JP  drain removed today 09/05/2013 with anticipated of some increased drainage over the next 2-3 days and then to dissipate..   Total: 0 NIH    Past Medical History  Past Medical History  Diagnosis Date  . Alcoholic hepatitis   . Hypertension   . 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 Shortness of breath     new onset- occasionally-at rest and exertion  . GERD (gastroesophageal reflux disease)   . Anxiety     Family History  family history includes Lung cancer in his father; Pancreatic cancer in his sister.  Prior Rehab/Hospitalizations: none. R ankle fx 12/2012 pt states he did his own therapy but he does not have full ROM. Pt states current weakness to RLE due to his old ankle fx.   Current Medications  Current facility-administered medications:aspirin EC tablet 81 mg, 81 mg, Oral, Daily, Roland Rack, MD, 81 mg at 09/05/13 1004;  bisacodyl (DULCOLAX) suppository 10 mg, 10 mg, Rectal, BID, Molli Hazard, MD, 10 mg at 08/31/13 0254;  clonazePAM [facility]) tablet 0.5 mg, 0.5 mg, Oral, Daily, Fredricka Bonine, MD, 0.5 mg at 09/05/13 1004 doxazosin (CARDURA) tablet 8 mg, 8 mg, Oral, Daily, Molli Hazard, MD, 8 mg at 27/06/23 7628;  folic acid (FOLVITE) tablet 1 mg, 1 mg, Oral, Daily, Molli Hazard, MD, 1 mg at 09/05/13 1004;  furosemide (LASIX) tablet 20 mg, 20 mg, Oral, Daily, Molli Hazard, MD, 20 mg at 09/05/13 1004;  heparin injection 5,000 Units, 5,000 Units, Subcutaneous, Q8H, Dennard Schaumann  Jasmine December, MD, 5,000 Units at 09/05/13 1005 hyoscyamine (LEVSIN SL) SL tablet 0.125 mg, 0.125 mg, Oral, Q4H PRN, Molli Hazard, MD;  lisinopril (PRINIVIL,ZESTRIL) tablet 40 mg, 40 mg, Oral, Daily, Fredricka Bonine, MD, 40 mg at 09/05/13 1005;  metoprolol (LOPRESSOR) tablet 50 mg, 50 mg, Oral, BID, Fredricka Bonine, MD, 50 mg at 09/05/13 1004;  morphine (MSIR) tablet 15 mg, 15 mg, Oral, Q4H PRN, Molli Hazard, MD, 15 mg at  09/05/13 1004 morphine 2 MG/ML injection 2-4 mg, 2-4 mg, Intravenous, Q2H PRN, Molli Hazard, MD, 2 mg at 08/30/13 0404;  multivitamin with minerals tablet 1 tablet, 1 tablet, Oral, Daily, Molli Hazard, MD, 1 tablet at 09/04/13 1049;  neomycin-bacitracin-polymyxin (NEOSPORIN) ointment 1 application, 1 application, Topical, TID PRN, Molli Hazard, MD ondansetron [facility]) injection 4 mg, 4 mg, Intravenous, Q4H PRN, Molli Hazard, MD, 4 mg at 09/05/13 1005;  opium-belladonna (B&O SUPPRETTES) suppository 1 suppository, 1 suppository, Rectal, Q6H PRN, Molli Hazard, MD, 1 suppository at 08/29/13 1739;  oxybutynin [facility]) tablet 5 mg, 5 mg, Oral, Q6H PRN, Molli Hazard, MD, 5 mg at 09/01/13 0252 pantoprazole (PROTONIX) EC tablet 40 mg, 40 mg, Oral, BID, Fredricka Bonine, MD, 40 mg at 09/05/13 1004;  senna-docusate (Senokot-S) tablet 1 tablet, 1 tablet, Oral, BID, Molli Hazard, MD, 1 tablet at 09/05/13 1004;  thiamine (B-1) injection 100 mg, 100 mg, Intravenous, Daily, Molli Hazard, MD;  thiamine (VITAMIN B-1) tablet 100 mg, 100 mg, Oral, Daily, Molli Hazard, MD, 100 mg at 09/05/13 1004  Patients Current Diet: General with thin liquids  Precautions / Restrictions Precautions Precautions: Fall Precaution Comments: R sided hemiparesis Restrictions Weight Bearing Restrictions: No   Prior Activity Level Community (5-7x/wk): Independendt without assistive device pta; driving. 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   Current Functional Level Cognition  Overall Cognitive Status: Within Functional Limits for tasks assessed Orientation Level: Oriented to person;Oriented to place;Oriented to time;Oriented to situation    Extremity  Assessment (includes Sensation/Coordination)          ADLs       Mobility  Overal bed mobility: Needs Assistance Bed Mobility: Supine to Sit;Sit to Supine Supine to sit: Mod assist;HOB elevated Sit to supine: Mod assist;+2 for safety/equipment;+2 for physical assistance General bed mobility comments: Pt OOB in recliner    Transfers  Overall transfer level: Needs assistance Equipment used:  (EVA walker) Transfers: Sit to/from Stand Sit to Stand: +2 physical assistance;Mod assist General transfer comment: 50% VC's on proper tech and hand placement.  Extra assist with stand to sit for control.     Ambulation / Gait / Stairs / Wheelchair Mobility  Ambulation/Gait Ambulation/Gait assistance: +2 physical assistance;Max assist Ambulation Distance (Feet): 34 Feet Assistive device:  (EVA walker) Gait Pattern/deviations: Step-to pattern;Decreased stance time - right;Narrow base of support;Antalgic;Staggering right Gait velocity: decreased General Gait Details: Used EVA walker due to R UE weakness and R KI applied for knee buckling. very unsteady gait requiring tactile cuefor upright posture and proper R LE placement to increase ABd.    Posture / Balance      Special needs/care consideration Skin abd surgical sites . JP removed 2/27 with dry gauze dressing applied and surgeon states to expect serous drainage for 3 to 10 days postop Bowel mgmt: continent Bladder mgmt: foley to remain  for 10 days postop per surgeon but states ecpect urinary incontinence after removal.    Previous Home Environment Living Arrangements: Alone;Other (Comment) (Dtr, Aldona Bar, has been living with her boyfriend, but plans)  Lives With: Alone (divorced for 15 years) Available Help at Discharge: Family;Other (Comment);Available 24 hours/day (sisters can stay with pt during the day when his dtr is in s) Type of Home: Mobile home Home Layout: One level Home Access: Stairs to enter Entrance Stairs-Rails:  Right;Left;Can reach both Entrance Stairs-Number of Steps: 4 to 5 steps Bathroom Shower/Tub: Multimedia programmer: Standard Bathroom Accessibility: No Home Care Services: No Additional Comments: lives alone for the past 6 months when his 46 yo dtr moved in with her boyfriend. Dtr plans to return home and pt's sisters to provide supervision during the day  Discharge Living Setting Plans for Discharge Living Setting: Patient's home;Alone Type of Home at Discharge: Mobile home Discharge Home Layout: One level Discharge Home Access: Stairs to enter Entrance Stairs-Rails: Right;Left;Can reach both Entrance Stairs-Number of Steps: 4 to 5 Discharge Bathroom Shower/Tub: Walk-in shower Discharge Bathroom Toilet: Standard Discharge Bathroom Accessibility: No Does the patient have any problems obtaining your medications?: No  Social/Family/Support Systems Patient Roles: Parent (pt has raised his daughter since she was 62 years old after h) Contact Information: Niall Illes, 58 yo dtr and his two sisters, [name] amd Tempie Anticipated Caregiver: sisters and daughter Anticipated Caregiver's Contact Information: see above Ability/Limitations of Caregiver: 58 yo dtr senior in high school; sister older but can provide supervision level during the day Caregiver Availability: 24/7 Discharge Plan Discussed with Primary Caregiver: Yes Is Caregiver In Agreement with Plan?: Yes Does Caregiver/Family have Issues with Lodging/Transportation while Pt is in Rehab?: No    Goals/Additional Needs Patient/Family Goal for Rehab: supervision PT, supervision to min OT Expected length of stay: ELOS 10 to 14 days Equipment Needs: Dr. Jasmine December states foley stays for 10 days and then expect urinary incontinence. Pt will go to his office for pelvic therapy a few weeks after surgery. Stitiches to be removed in one week postop. Expect drainage from wher JP removed 09/05/13 for 3 to 10 days postop. Needs Daily  dressing changes. Pt/Family Agrees to Admission and willing to participate: Yes Program Orientation Provided & Reviewed with Pt/Caregiver Including Roles  & Responsibilities: Yes   Decrease burden of Care through IP rehab admission: n/a  Possible need for SNF placement upon discharge: no   Patient Condition: Discussed pt with Dr. Letta Pate today and reviewed medical chart from [facility]. Patient is appropriate for comprehensive inpatient rehabilitation.  Preadmission Screen Completed By:  Cleatrice Burke, 09/05/2013 10:30 AM ______________________________________________________________________   Discussed status with Dr. Letta Pate on 09/05/13 at 27 and received telephone approval for admission today.  Admission Coordinator:  Cleatrice Burke, time 9628 Date 09/05/13.

## 2013-09-05 NOTE — Discharge Summary (Signed)
Physician Discharge Summary  Patient ID: Tanner [name] MRN: [id number] DOB/AGE: 1955-08-03 58 y.o.  Admit date: 08/29/2013 Discharge date: 09/05/2013  Admission Diagnoses: Prostate cancer  Discharge Diagnoses:  Active Problems:   Malignant neoplasm of prostate   Ataxia of right upper extremity Stroke  Discharged Condition: fair  Hospital Course:  This patient was admitted following radical robotic prostatectomy with bilateral pelvic lymph node dissection and a lysis of colonic adhesions.  He did well postoperatively.  He did experience some prolonged nausea which resolved quickly with time.  There was concern about his JP drainage and this was sent for fluid creatinine which was consistent with serum shunt was not a urine leak.  He was able to ambulate, tolerate a regular diet, control his pain with oral medications, and passed flatus.  He is ready for discharge home on postoperative day 4 when he had sudden weakness and paresthesia of his right upper and lower extremity as well as deviation of his tongue to the right.  Stat CT of the head was negative.  Urology was counseled in the stroke he was activated emergently.  He had an MRI of the spine and brain which revealed a stroke.  This was ischemic in nature.  He was not a candidate for TPA.  The patient worked with occupational therapy and physical therapy and began to regain strength in his hand and right upper extremity.  Neurology recommended further evaluation which revealed the patient needed inpatient rehabilitation.  He was evaluated by the inpatient team and accepted for care.  His JP drain was removed.  We reviewed the results of his pathology which was pT3b N0, Gleason 3+4=7.  He had full return of bowel function before his discharge.   Consults: Neurology. Inpatient Rehab.  Significant Diagnostic Studies: labs: JP creatinine consistent with serume Cr and radiology: MRI: Stroke and CT scan: Head- no evidence of stroke.  Treatments: IV  hydration, anticoagulation: ASA and heparin, therapies: PT and OT and surgery: Robotic radical prostatectomy with bilateral pelvic lymph node dissection and lysis of colonic adhesions.  Discharge Exam: Blood pressure 118/71, pulse 87, temperature 98.6 F (37 C), temperature source Oral, resp. rate 18, height 5\' 10"  (1.778 m), weight 83.9 kg (184 lb 15.5 oz), SpO2 92.00%. Refer to PE from progress note on date of discharge.  Disposition: Inpatient Rehab  Discharge Orders   Future Orders Complete By Expires   Discharge patient  As directed    Discharge patient  As directed        Medication List    STOP taking these medications       doxazosin 8 MG tablet  Commonly known as:  CARDURA     GOODY BODY PAIN 500-325 MG Pack  Generic drug:  Aspirin-Acetaminophen     oxyCODONE-acetaminophen 5-325 MG per tablet  Commonly known as:  PERCOCET/ROXICET      TAKE these medications       aspirin 81 MG EC tablet  Take 1 tablet (81 mg total) by mouth daily.     ciprofloxacin 500 MG tablet  Commonly known as:  CIPRO  Take 1 tablet (500 mg total) by mouth 2 (two) times daily. Begin the day before you return to have your catheter removed.     clonazePAM 0.5 MG tablet  Commonly known as:  KLONOPIN  Take 0.5 mg by mouth 3 (three) times daily.     furosemide 40 MG tablet  Commonly known as:  LASIX  Take 20 mg by mouth every morning.  hyoscyamine 0.125 MG tablet  Commonly known as:  LEVSIN, ANASPAZ  Take 1 tablet (0.125 mg total) by mouth every 4 (four) hours as needed (bladder spasms).     lisinopril 40 MG tablet  Commonly known as:  PRINIVIL,ZESTRIL  Take 40 mg by mouth every morning.     metoprolol 50 MG tablet  Commonly known as:  LOPRESSOR  Take 50 mg by mouth 2 (two) times daily.     morphine 15 MG tablet  Commonly known as:  MSIR  Take 1 tablet (15 mg total) by mouth every 4 (four) hours as needed for moderate pain or severe pain.     omeprazole 40 MG capsule   Commonly known as:  PRILOSEC  Take 40 mg by mouth 2 (two) times daily.     ondansetron 4 MG tablet  Commonly known as:  ZOFRAN  Take 1 tablet (4 mg total) by mouth every 4 (four) hours as needed for nausea or vomiting.     oxybutynin 5 MG tablet  Commonly known as:  DITROPAN  Take 1 tablet (5 mg total) by mouth every 6 (six) hours as needed for bladder spasms.     senna-docusate 8.6-50 MG per tablet  Commonly known as:  SENOKOT S  Take 1 tablet by mouth 2 (two) times daily.           Follow-up Information   Follow up with Molli Hazard, MD On 09/10/2013. (9:15 for catheter removal.)    Specialty:  Urology   Contact information:   [facility]  [place] [phone]       Signed: Molli Hazard 09/05/2013, 10:18 AM

## 2013-09-05 NOTE — H&P (Addendum)
Physical Medicine and Rehabilitation Admission H&P    :  Chief complaint: Right-sided weakness  HPI: Tanner [name] is a 58 y.o. right-handed male with history of hypertension as well as alcoholic hepatitis. Admitted 08/29/2012 with workup for recent prostate cancer followed by urology services and underwent robotic-assisted laparoscopic radical prostatectomy 08/29/2013 per Dr. Jasmine December. Presented with new onset of right side weakness. MRI of the brain showed acute nonhemorrhagic infarct extending from posterior left lenticular nucleus to posterior left corona radiata. Incidental finding of right ICA aneurysm with planned workup as outpatient. Echocardiogram with ejection fraction of 65% and no wall motion abnormalities. Carotid Dopplers with no ICA stenosis. Patient did not receive TPA. Neurology services consulted maintained on aspirin therapy for CVA prophylaxis as well as subcutaneous heparin for DVT prophylaxis. Patient is tolerating a regular diet. Followup urology services in reference to radical prostatectomy advised to keep Foley catheter tube x10 days and JP drain removed today 09/05/2013 with anticipated of some increased drainage over the next 2-3 days and then to dissipate.Marland Kitchen Physical and occupational therapy evaluations completed ongoing with recommendations for physical medicine rehabilitation consult. Patient was admitted for comprehensive rehabilitation program   Pt denies numbness, tingling or swallowing problems, also denies vision problems since CVA Has abd pain at incision sites  ROS Review of Systems  Gastrointestinal:  GERD  Genitourinary: Positive for hematuria.  Psychiatric/Behavioral:  Anxiety  Remaining review of systems negative    Past Medical History    Diagnosis  Date    .  Alcoholic hepatitis     .  Hypertension     .  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  Shortness of breath       new onset- occasionally-at rest and exertion    .  GERD  (gastroesophageal reflux disease)     .  Anxiety        Past Surgical History    Procedure  Laterality  Date    .  Hand surgery  Right     .  Hernia repair      .  Orif ankle fracture  Right  12/10/2012      Procedure: OPEN REDUCTION INTERNAL FIXATION (ORIF) ANKLE FRACTURE; Surgeon: Hessie Dibble, MD; Location: [facility]; Service: Orthopedics; Laterality: Right;    .  Robot assisted laparoscopic radical prostatectomy  N/A  08/29/2013      Procedure: ROBOTIC ASSISTED LAPAROSCOPIC RADICAL PROSTATECTOMY LEVEL 2, Lysis of adhesions; Surgeon: Molli Hazard, MD; Location: [facility]; Service: Urology; Laterality: N/A;    .  Lymphadenectomy  Bilateral  08/29/2013      Procedure: LYMPHADENECTOMY "BILATERAL PELVIC LYMPH NODE DISSECTION"; Surgeon: Molli Hazard, MD; Location: [facility]; Service: Urology; Laterality: Bilateral;       Family History    Problem  Relation  Age of Onset    .  Pancreatic cancer  Sister     .  Lung cancer  Father      Social History: reports that he quit smoking about 8 months ago. He has never used smokeless tobacco. He reports that he drinks alcohol. He reports that he uses illicit drugs.  Allergies:    Allergies    Allergen  Reactions    .  Dilaudid [Hydromorphone Hcl]  Nausea And Vomiting      Pre pt history    .  Oxycodone  Nausea And Vomiting    .  Procaine Hcl  Patient states he is allergic to novicaine    .  Sulfa Antibiotics  Rash       Medications Prior to Admission    Medication  Sig  Dispense  Refill    .  clonazePAM (KLONOPIN) 0.5 MG tablet  Take 0.5 mg by mouth 3 (three) times daily.      .  furosemide (LASIX) 40 MG tablet  Take 20 mg by mouth every morning.      Marland Kitchen  lisinopril (PRINIVIL,ZESTRIL) 40 MG tablet  Take 40 mg by mouth every morning.      .  metoprolol (LOPRESSOR) 50 MG tablet  Take 50 mg by mouth 2 (two) times daily.      Marland Kitchen  omeprazole (PRILOSEC) 40 MG capsule  Take 40 mg by mouth 2 (two) times daily.      .  [DISCONTINUED]  Aspirin-Acetaminophen (GOODY BODY PAIN) 500-325 MG PACK  Take 1 Package by mouth every 6 (six) hours as needed (pain).      .  [DISCONTINUED] doxazosin (CARDURA) 8 MG tablet  Take 8 mg by mouth every morning.      .  [DISCONTINUED] oxyCODONE-acetaminophen (PERCOCET/ROXICET) 5-325 MG per tablet  Take 1-2 tablets by mouth every 4 (four) hours as needed for severe pain. Caused nausea       Home:  Home Living  Family/patient expects to be discharged to:: Unsure  Living Arrangements: Alone  Additional Comments: pt lives alone.  Functional History:   Functional Status:  Mobility:    Ambulation/Gait  Ambulation Distance (Feet): 34 Feet  Gait velocity: decreased  General Gait Details: Used EVA walker due to R UE weakness and R KI applied for knee buckling. very unsteady gait requiring tactile cuefor upright posture and proper R LE placement to increase ABd.   ADL:  ADL  Grooming: Wash/dry hands;Wash/dry face;Performed (used R hand chin; used R as gross assist for lotion)  Where Assessed - Grooming: Supine, head of bed up  Upper Body Bathing: Minimal assistance  Where Assessed - Upper Body Bathing: Supine, head of bed up;Unsupported sitting (used L and R. Assist for more pressure over L arm)  Lower Body Bathing: +2 Total assistance  Where Assessed - Lower Body Bathing: Supported sit to stand  Upper Body Dressing: Moderate assistance  Where Assessed - Upper Body Dressing: Unsupported sitting  Lower Body Dressing: +2 Total assistance  Where Assessed - Lower Body Dressing: Supported sit to stand  Toilet Transfer: Simulated;+2 Total assistance  Toilet Transfer Method: Squat pivot  Equipment Used: Rolling walker  Transfers/Ambulation Related to ADLs: sat eob x 10 min with unilateral to bil support with supervision. +2 for safety with squat pivot transfer to L side to recliner  ADL Comments: cues needed to protect RUe during movements. cues to use this especially with bil activities. Used tray  table for AAROM for elbow and supination. Physical assist for shoulder  Cognition:  Cognition  Overall Cognitive Status: Within Functional Limits for tasks assessed  Orientation Level: Oriented to person;Oriented to place;Oriented to time;Oriented to situation  Cognition  Arousal/Alertness: Awake/alert  Behavior During Therapy: WFL for tasks assessed/performed  Overall Cognitive Status: Within Functional Limits for tasks assessed  Physical Exam:  Blood pressure 118/71, pulse 87, temperature 98.6 F (37 C), temperature source Oral, resp. rate 18, height 5\' 10"  (1.778 m), weight 83.9 kg (184 lb 15.5 oz), SpO2 92.00%.  Physical Exam  Constitutional: He is oriented to person, place, and time.  HENT:  Head: Normocephalic.  Eyes:  EOM are normal.  Neck: Normal range of motion. Neck supple. No thyromegaly present.  Cardiovascular: Normal rate and regular rhythm.  Respiratory: Effort normal and breath sounds normal. No respiratory distress.  GI: Soft. Bowel sounds are normal. He exhibits no distension. Multiple small incision sites on Right and Left upper and lower quadrants  Neurological: He is alert and oriented to person, place, and time. Normal finger to nose and the left but ataxic on the right  Musculoskeletal .muscle strength right upper and right lower extremity 3+/5 with normal bulk and tone  5/5 Strength in Left upper and lower ext Reduced Lt touch R hand otherwise intact Follows full commands  Skin: Skin is warm and dry. Staples intact to radical prostatectomy site.  Genitourinary. Foley tube intact  No results found for this or any previous visit (from the past 48 hour(s)).  No results found.  Post Admission Physician Evaluation:  1. Functional deficits secondary to Left lenticular and left corona radiata infarcts. 2. Patient is admitted to receive collaborative, interdisciplinary care between the physiatrist, rehab nursing staff, and therapy team. 3. Patient's level of medical  complexity and substantial therapy needs in context of that medical necessity cannot be provided at a lesser intensity of care such as a SNF. 4. Patient has experienced substantial functional loss from his/her baseline which was documented above under the "Functional History" and "Functional Status" headings. Judging by the patient's diagnosis, physical exam, and functional history, the patient has potential for functional progress which will result in measurable gains while on inpatient rehab. These gains will be of substantial and practical use upon discharge in facilitating mobility and self-care at the household level. 5. Physiatrist will provide 24 hour management of medical needs as well as oversight of the therapy plan/treatment and provide guidance as appropriate regarding the interaction of the two. 6. 24 hour rehab nursing will assist with bladder management, safety, skin/wound care, disease management, medication administration, pain management and patient education and help integrate therapy concepts, techniques,education, etc. 7. PT will assess and treat for/with: pre gait, gait training, endurance , safety, equipment, neuromuscular re education. Goals are: Sup mobility. 8. OT will assess and treat for/with: ADLs, Cognitive perceptual skills, Neuromuscular re education, safety, endurance, equipment. Goals are: sup / mod I ADL. 9. SLP will assess and treat for/with: NA. Goals are: NA. 10. Case Management and Social Worker will assess and treat for psychological issues and discharge planning. 11. Team conference will be held weekly to assess progress toward goals and to determine barriers to discharge. 12. Patient will receive at least 3 hours of therapy per day at least 5 days per week. 13. ELOS: 16-22 days  14. Prognosis: good Medical Problem List and Plan:  1. 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 DVT Prophylaxis/Anticoagulation: Subcutaneous heparin.  Monitor platelet counts any signs of bleeding  3. Pain Management: MSIR 15 mg eve severe painry 4 hours as needed  4. Mood: Klonopin 0.5 mg daily. Provide emotional support  5. Neuropsych: This patient is capable of making decisions on his own behalf.  6. Robotic radical prostatectomy 08/29/2013 per Dr. Jasmine December urology services. Keep Foley catheter tube x10 days. JP drain removed 09/05/2013  7. Hypertension. Cardura 8 mg daily, Lasix 20 mg daily, lisinopril 40 mg daily, Lopressor 50 mg twice a day. Monitor with increased mobility  8. Incidental findings of right ICA aneurysm. Plan followup outpatient with repeat imaging in one year   [name] M.D. [facility] FAAPM&R (Sports Med, Neuromuscular Med) Diplomate Am Board of Electrodiagnostic Med   09/05/2013

## 2013-09-05 NOTE — Progress Notes (Signed)
Report called to CIR and given to [facility] . Transportation has been arranged.

## 2013-09-05 NOTE — Progress Notes (Signed)
I met with Tanner [name] and Dr. Jasmine December at bedside to discuss inpt rehab admission as an option for his recovery. Tanner [name] is in agreement to admission and I spoke with his sister, [name], by phone and she is aware and in agreement. I will make arrangements to admit today. RN CM is aware.  [phone]

## 2013-09-06 ENCOUNTER — Inpatient Hospital Stay (HOSPITAL_COMMUNITY): Payer: Medicaid Other

## 2013-09-06 ENCOUNTER — Inpatient Hospital Stay (HOSPITAL_COMMUNITY): Payer: Medicaid Other | Admitting: Physical Therapy

## 2013-09-06 ENCOUNTER — Inpatient Hospital Stay (HOSPITAL_COMMUNITY): Payer: Medicaid Other | Admitting: *Deleted

## 2013-09-06 DIAGNOSIS — C61 Malignant neoplasm of prostate: Secondary | ICD-10-CM

## 2013-09-06 DIAGNOSIS — R112 Nausea with vomiting, unspecified: Secondary | ICD-10-CM

## 2013-09-06 DIAGNOSIS — I6350 Cerebral infarction due to unspecified occlusion or stenosis of unspecified cerebral artery: Secondary | ICD-10-CM

## 2013-09-06 LAB — URINALYSIS, ROUTINE W REFLEX MICROSCOPIC
Glucose, UA: NEGATIVE mg/dL
Ketones, ur: 15 mg/dL — AB
NITRITE: NEGATIVE
Protein, ur: 30 mg/dL — AB
SPECIFIC GRAVITY, URINE: 1.017 (ref 1.005–1.030)
UROBILINOGEN UA: 1 mg/dL (ref 0.0–1.0)
pH: 5 (ref 5.0–8.0)

## 2013-09-06 LAB — CBC
HCT: 31.9 % — ABNORMAL LOW (ref 39.0–52.0)
Hemoglobin: 11.1 g/dL — ABNORMAL LOW (ref 13.0–17.0)
MCH: 33.4 pg (ref 26.0–34.0)
MCHC: 34.8 g/dL (ref 30.0–36.0)
MCV: 96.1 fL (ref 78.0–100.0)
PLATELETS: 235 10*3/uL (ref 150–400)
RBC: 3.32 MIL/uL — ABNORMAL LOW (ref 4.22–5.81)
RDW: 12.1 % (ref 11.5–15.5)
WBC: 11.1 10*3/uL — ABNORMAL HIGH (ref 4.0–10.5)

## 2013-09-06 LAB — COMPREHENSIVE METABOLIC PANEL
ALK PHOS: 52 U/L (ref 39–117)
ALT: 9 U/L (ref 0–53)
AST: 15 U/L (ref 0–37)
Albumin: 2.4 g/dL — ABNORMAL LOW (ref 3.5–5.2)
BILIRUBIN TOTAL: 0.6 mg/dL (ref 0.3–1.2)
BUN: 13 mg/dL (ref 6–23)
CO2: 27 meq/L (ref 19–32)
Calcium: 8.6 mg/dL (ref 8.4–10.5)
Chloride: 85 mEq/L — ABNORMAL LOW (ref 96–112)
Creatinine, Ser: 1.36 mg/dL — ABNORMAL HIGH (ref 0.50–1.35)
GFR, EST AFRICAN AMERICAN: 66 mL/min — AB (ref >90)
GFR, EST NON AFRICAN AMERICAN: 57 mL/min — AB (ref >90)
GLUCOSE: 122 mg/dL — AB (ref 70–99)
Potassium: 3.8 mEq/L (ref 3.7–5.3)
SODIUM: 127 meq/L — AB (ref 137–147)
Total Protein: 6.3 g/dL (ref 6.0–8.3)

## 2013-09-06 LAB — URINE MICROSCOPIC-ADD ON

## 2013-09-06 LAB — LIPASE, BLOOD: Lipase: 14 U/L (ref 11–59)

## 2013-09-06 LAB — LACTIC ACID, PLASMA: Lactic Acid, Venous: 0.9 mmol/L (ref 0.5–2.2)

## 2013-09-06 MED ORDER — PIPERACILLIN-TAZOBACTAM 3.375 G IVPB
3.3750 g | Freq: Three times a day (TID) | INTRAVENOUS | Status: DC
  Administered 2013-09-06 – 2013-09-09 (×8): 3.375 g via INTRAVENOUS
  Filled 2013-09-06 (×12): qty 50

## 2013-09-06 MED ORDER — ACETAMINOPHEN 650 MG RE SUPP
650.0000 mg | Freq: Once | RECTAL | Status: AC
  Administered 2013-09-06: 650 mg via RECTAL
  Filled 2013-09-06: qty 1

## 2013-09-06 MED ORDER — PANTOPRAZOLE SODIUM 40 MG PO TBEC
40.0000 mg | DELAYED_RELEASE_TABLET | Freq: Two times a day (BID) | ORAL | Status: DC
  Administered 2013-09-08: 40 mg via ORAL
  Filled 2013-09-06 (×9): qty 1

## 2013-09-06 MED ORDER — SODIUM CHLORIDE 0.9 % IV SOLN: INTRAVENOUS | Status: DC

## 2013-09-06 MED ORDER — SODIUM CHLORIDE 0.9 % IV SOLN
INTRAVENOUS | Status: DC
  Administered 2013-09-06 – 2013-09-09 (×3): via INTRAVENOUS

## 2013-09-06 MED ORDER — SODIUM CHLORIDE 0.9 % IV SOLN
Freq: Once | INTRAVENOUS | Status: AC
  Administered 2013-09-06: 22:00:00 via INTRAVENOUS

## 2013-09-06 MED ORDER — PIPERACILLIN-TAZOBACTAM 3.375 G IVPB 30 MIN
3.3750 g | Freq: Four times a day (QID) | INTRAVENOUS | Status: DC
Start: 2013-09-06 — End: 2013-09-06

## 2013-09-06 MED ORDER — SODIUM CHLORIDE 0.9 % IV SOLN
Freq: Once | INTRAVENOUS | Status: AC
  Administered 2013-09-06: 21:00:00 via INTRAVENOUS

## 2013-09-06 NOTE — Plan of Care (Signed)
Problem: RH PAIN MANAGEMENT Goal: RH STG PAIN MANAGED AT OR BELOW PT'S PAIN GOAL Less than 2  Outcome: Not Progressing Pt reports pain of 6. Requesting Morphine Sulfate IR 15mg  q 4hrs

## 2013-09-06 NOTE — Progress Notes (Signed)
Reviewed records UA still not sent Reviewed bps- they have been somewhat low I have ordered stat labs: lactic acid, cbc, cxr Reviewed cbc and bmet  Note hyponatremia and relative hypotension- bolus of fluids- increase baseline emperic ABX- zosyn

## 2013-09-06 NOTE — Significant Event (Signed)
At 0900, nurse called to room by Opal Sidles, [place]. Pt in w./c with eyes rolling backwards x3-5 seconds with fixed gaze. Briefly unable to follow command. Gaze then became appropriate, and pt able to follow commands. Vitals taken with BP 100/66 after morning blood pressure medication given. Dr. Alain Marion, MD called to room. Pt assessed by MD, no new orders at this time, as MD had made adjustment to medication regiment prior to this incident.   Nurse notified at 1530 that pt was running a temp of 100.2. Nurse went to room to assess pt. Pt then requested Tylenol and also complained of nausea. Before nurse could give administer Tylenol, pt asked for basin, and vomited a large amount of clear broth like liquid. Pt refused Zofran at the time and was give a small amount of ginger ale.  Pt reported that the did not order lunch or dinner because he had 'no appetite . Pt's temperature rechecked at 1600. Temp 101.8. Dr. Alain Marion again contacted. Order received for [facility] x 2, UA C&S, and IV Zofran.   Notified by pt's sister at 23 that pt's is verbalizing that 'he feels funny'. He stated that he feels similar to what he experienced when he had his initial stroke. Assessment done. No visible facial droop, no slurred speech, etc. Dr. Judd Gaudier contacted. Order given for CT of the head w/o contrast to r/o extension of stoke or bleed. Above order implemented. On-coming nurse to continue to monitor.

## 2013-09-06 NOTE — Evaluation (Signed)
Occupational Therapy Assessment and Plan  Patient Details  Name: Tanner [name] MRN: [id number] Date of Birth: 10-20-1955  OT Diagnosis: hemiplegia affecting dominant side and muscle weakness (generalized) Rehab Potential: Rehab Potential: Good ELOS: 14-16 days   Today's Date: 09/06/2013 Time:  - 8333-8329  (90 min) 1st session    Problem List:  Patient Active Problem List   Diagnosis Date Noted  . CVA (cerebral infarction) 09/05/2013  . 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 Malignant neoplasm of prostate 08/29/2013  . Prostate cancer 05/22/2013  . Hyponatremia 12/11/2012    Class: Chronic  . Ankle fracture 12/10/2012    Past Medical History:  Past Medical History  Diagnosis Date  . Alcoholic hepatitis   . Hypertension   . 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 Shortness of breath     new onset- occasionally-at rest and exertion  . GERD (gastroesophageal reflux disease)   . Anxiety    Past Surgical History:  Past Surgical History  Procedure Laterality Date  . Hand surgery Right   . Hernia repair    . Orif ankle fracture Right 12/10/2012    Procedure: OPEN REDUCTION INTERNAL FIXATION (ORIF) ANKLE FRACTURE;  Surgeon: Hessie Dibble, MD;  Location: [facility];  Service: Orthopedics;  Laterality: Right;  . Robot assisted laparoscopic radical prostatectomy N/A 08/29/2013    Procedure: ROBOTIC ASSISTED LAPAROSCOPIC RADICAL PROSTATECTOMY LEVEL 2, Lysis of adhesions;  Surgeon: Molli Hazard, MD;  Location: [facility];  Service: Urology;  Laterality: N/A;  . Lymphadenectomy Bilateral 08/29/2013    Procedure: LYMPHADENECTOMY "BILATERAL PELVIC LYMPH NODE DISSECTION";  Surgeon: Molli Hazard, MD;  Location: [facility];  Service: Urology;  Laterality: Bilateral;    Assessment & Plan Clinical Impression:Tanner [name] 58 y.o. right-handed male with history of hypertension, alcoholic hepatitis. Admitted 08/29/2012 with workup for recent prostate cancer followed by  urology services and underwent robotic-assisted laparoscopic radical prostatectomy 08/29/2013 Presented with new onset of right side weakness. MRI of the brain showed acute nonhemorrhagic infarct extending from posterior left lenticular nucleus to posterior left corona radiata. Incidental finding of right ICA aneurysm with planned workup as outpatient. h ejection fraction of 65% . Patient did not receive TPA. maintained on aspirin therapy for CVA prophylaxis as well as subcutaneous heparin for DVT prophylaxis.Is on a regular diet. Followup urology services in reference to radical prostatectomy advised to keep Foley catheter tube x10 days and JP drain removed today 09/05/2013 with anticipated of some increased drainage over the next 2-3 days and then to dissipate..   Patient transferred to CIR on 09/05/2013 .    Patient currently requires mod with basic self-care skills secondary to decreased cardiorespiratoy endurance.  Prior to hospitalization, patient could complete BADL and IADL with independent .  Patient will benefit from skilled intervention to increase independence with basic self-care skills and increase level of independence with iADL prior to discharge home with care partner.  Anticipate patient will require intermittent supervision and follow up home health.  OT - End of Session Activity Tolerance: Tolerates 10 - 20 min activity with multiple rests Endurance Deficit: Yes Endurance Deficit Description: hypotension when OOB/EOB OT Assessment Rehab Potential: Good Barriers to Discharge: Inaccessible home environment Barriers to Discharge Comments:  (Can not get in bathroom with RW) OT Patient demonstrates impairments in the following area(s): Balance;Endurance;Motor;Pain;Safety OT Basic ADL's Functional Problem(s): Grooming;Bathing;Dressing;Toileting;Eating OT Advanced ADL's Functional Problem(s): Simple Meal Preparation OT Transfers Functional Problem(s): Toilet;Tub/Shower OT Additional  Impairment(s): Fuctional Use of Upper  Extremity OT Plan OT Intensity: Minimum of 1-2 x/day, 45 to 90 minutes OT Frequency: 5 out of 7 days OT Duration/Estimated Length of Stay: 14-16 days OT Treatment/Interventions: Medical illustrator training;Community reintegration;Discharge planning;DME/adaptive equipment instruction;Functional mobility training;Neuromuscular re-education;Pain management;Patient/family education;Self Care/advanced ADL retraining;Therapeutic Activities;Therapeutic Exercise;UE/LE Strength taining/ROM;UE/LE Coordination activities;Wheelchair propulsion/positioning OT Self Feeding Anticipated Outcome(s): independent using RUE OT Basic Self-Care Anticipated Outcome(s): supervision OT Toileting Anticipated Outcome(s): supervision OT Bathroom Transfers Anticipated Outcome(s): supervision OT Recommendation Recommendations for Other Services: Vestibular eval Patient destination: Home Follow Up Recommendations: Home health OT Equipment Recommended:  (says he has a tub seat but does not fit in his tub or shower)    Skilled Therapeutic Intervention 2nd session; Time:1400-1430  (30 min) Pain:  7/10 abdomen Individual session  BP =98/65, HR= 97 sit :BP= 100/67; HR= 99 Addressed RUE AROM and strength.  Encouraged pt to start eating and drinking with RUE as much as possible.    OT Evaluation Precautions/Restrictions  Precautions Precautions: Fall Precaution Comments: R sided hemiparesis Restrictions Weight Bearing Restrictions: No    Vital Signs Therapy Vitals BP:  BP  121/76 supine.  Sat EOB BP= 110/65.  Oxygen Therapy SpO2: 90 % O2 Device: None (Room air) Pain Pain Assessment Pain Score:7/10 abdomen Home Living/Prior Functioning Home Living Family/patient expects to be discharged to::home with daughter and sisters to assist Living Arrangements:Lived alone.  Comment- daughter can assist after D/C ADL   Vision/Perception  Vision - History Baseline Vision: Wears  glasses only for reading Visual History: Glaucoma Vision - Assessment Eye Alignment: Within Functional Limits Perception Perception: Within Functional Limits  Cognition Overall Cognitive Status: Within Functional Limits for tasks assessed Arousal/Alertness: Awake/alert Orientation Level: Oriented X4 Attention: Selective Safety/Judgment: Appears intact Sensation Sensation Light Touch: Appears Intact Proprioception: Appears Intact Coordination Gross Motor Movements are Fluid and Coordinated: No Fine Motor Movements are Fluid and Coordinated: No Finger Nose Finger Test:  (6x20 sec on right/ 15x/20sec on left) Motor  Motor Motor: Hemiplegia Motor - Skilled Clinical Observations: slowe movements Mobility  Bed Mobility Bed Mobility: Rolling Right;Rolling Left;Right Sidelying to Sit;Sitting - Scoot to [employer] of Bed;Sit to Sidelying Right;Scooting to [facility] Rolling Right: 4: Min assist;With rail Rolling Left: 5: Supervision;With rail Right Sidelying to Sit: 4: Min assist;HOB flat;With rails Sitting - Scoot to Edge of Bed: 5: Supervision Sitting - Scoot to [employer] of Bed Details: Verbal cues for technique Transfers Sit to Stand: 4: Min assist Sit to Stand Details: Verbal cues for technique Stand to Sit: 4: Min assist  Trunk/Postural Assessment  Cervical Assessment Cervical Assessment: Within Functional Limits Thoracic Assessment Thoracic Assessment: Exceptions to [facility] Lumbar Assessment Lumbar Assessment: Exceptions to [facility] Lumbar AROM Overall Lumbar AROM: Due to premorid status  Balance Balance Balance Assessed: Yes Static Sitting Balance Static Sitting - Balance Support: Feet supported Static Sitting - Level of Assistance: 5: Stand by assistance Dynamic Sitting Balance Dynamic Sitting - Balance Support: Feet supported Dynamic Sitting - Level of Assistance: 4: Min assist Dynamic Sitting - Balance Activities: Lateral lean/weight shifting;Forward lean/weight shifting Static Standing  Balance Static Standing - Balance Support: Left upper extremity supported Static Standing - Level of Assistance: 3: Mod assist Dynamic Standing Balance Dynamic Standing - Balance Support: Left upper extremity supported Dynamic Standing - Level of Assistance: 3: Mod assist Dynamic Standing - Balance Activities: Lateral lean/weight shifting Extremity/Trunk Assessment RUE Assessment RUE Assessment: Exceptions to [facility] RUE AROM (degrees) Overall AROM Right Upper Extremity: Deficits (movements in all joints; slow and purposeful) LUE Assessment LUE Assessment: Within Functional Limits  FIM:  FIM - Eating Eating Activity: 7: Complete independence:no helper FIM - Bed/Chair Transfer Bed/Chair Transfer: 3: Supine > Sit: Mod A (lifting assist/Pt. 50-74%/lift 2 legs;3: Sit > Supine: Mod A (lifting assist/Pt. 50-74%/lift 2 legs);3: Bed > Chair or W/C: Mod A (lift or lower assist);3: Chair or W/C > Bed: Mod A (lift or lower assist)   Refer to Care Plan for Long Term Goals  Recommendations for other services: Other:  Discharge Criteria: Patient will be discharged from OT if patient refuses treatment 3 consecutive times without medical reason, if treatment goals not met, if there is a change in medical status, if patient makes no progress towards goals or if patient is discharged from hospital.  The above assessment, treatment plan, treatment alternatives and goals were discussed and mutually agreed upon: by patient and by family  Lisa Roca 09/06/2013, 6:45 PM

## 2013-09-06 NOTE — Evaluation (Signed)
Physical Therapy Assessment and Plan  Patient Details  Name: Tanner [name] MRN: [id number] Date of Birth: 08/24/1955  PT Diagnosis: Abnormality of gait, Coordination disorder and Hemiparesis dominant Rehab Potential: Good ELOS: 14-16 days   Today's Date: 09/06/2013 Time: 4098-1191 Time Calculation: 60 minutes  Problem List:  Patient Active Problem List   Diagnosis Date Noted  . CVA (cerebral infarction) 09/05/2013  . 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 Malignant neoplasm of prostate 08/29/2013  . Prostate cancer 05/22/2013  . Hyponatremia 12/11/2012    Class: Chronic  . Ankle fracture 12/10/2012    Past Medical History:  Past Medical History  Diagnosis Date  . Alcoholic hepatitis   . Hypertension   . 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 Shortness of breath     new onset- occasionally-at rest and exertion  . GERD (gastroesophageal reflux disease)   . Anxiety    Past Surgical History:  Past Surgical History  Procedure Laterality Date  . Hand surgery Right   . Hernia repair    . Orif ankle fracture Right 12/10/2012    Procedure: OPEN REDUCTION INTERNAL FIXATION (ORIF) ANKLE FRACTURE;  Surgeon: Hessie Dibble, MD;  Location: [facility];  Service: Orthopedics;  Laterality: Right;  . Robot assisted laparoscopic radical prostatectomy N/A 08/29/2013    Procedure: ROBOTIC ASSISTED LAPAROSCOPIC RADICAL PROSTATECTOMY LEVEL 2, Lysis of adhesions;  Surgeon: Molli Hazard, MD;  Location: [facility];  Service: Urology;  Laterality: N/A;  . Lymphadenectomy Bilateral 08/29/2013    Procedure: LYMPHADENECTOMY "BILATERAL PELVIC LYMPH NODE DISSECTION";  Surgeon: Molli Hazard, MD;  Location: [facility];  Service: Urology;  Laterality: Bilateral;    Assessment & Plan Clinical Impression: Tanner [name] is a 58 y.o. right-handed male with history of hypertension as well as alcoholic hepatitis. Admitted 08/29/2012 with workup for recent prostate cancer followed by urology  services and underwent robotic-assisted laparoscopic radical prostatectomy 08/29/2013 per Dr. Jasmine December. Presented with new onset of right side weakness. MRI of the brain showed acute nonhemorrhagic infarct extending from posterior left lenticular nucleus to posterior left corona radiata. Incidental finding of right ICA aneurysm with planned workup as outpatient. Echocardiogram with ejection fraction of 65% and no wall motion abnormalities. Carotid Dopplers with no ICA stenosis. Patient did not receive TPA. Neurology services consulted maintained on aspirin therapy for CVA prophylaxis as well as subcutaneous heparin for DVT prophylaxis. Patient is tolerating a regular diet. Followup urology services in reference to radical prostatectomy advised to keep Foley catheter tube x10 days and JP drain removed today 09/05/2013 with anticipated of some increased drainage over the next 2-3 days and then to dissipate.Tanner [name] Physical and occupational therapy evaluations completed ongoing with recommendations for physical medicine rehabilitation consult.  Patient transferred to CIR on 09/05/2013 .   Patient currently requires mod with mobility secondary to impaired timing and sequencing, abnormal tone and decreased coordination.  Prior to hospitalization, patient was independent  with mobility and lived with Alone in a Mobile home home.  Home access is 4 to 5 steps to deck and 1 landing/1/2 step to enterStairs to enter.  Patient will benefit from skilled PT intervention to maximize safe functional mobility, minimize fall risk and decrease caregiver burden for planned discharge home with 24 hour supervision.  Anticipate patient will benefit from follow up [facility] at discharge.   PT Evaluation Precautions/Restrictions Precautions Precautions: Fall Precaution Comments:  (R side weakness) Restrictions Weight Bearing Restrictions: No General Vital SignsTherapy Vitals BP: 97/65 (supine), HR 89  BP: 89/59 (sitting),  HR 159 (post  transfer from bed into w/c and patient c/o lightheadedness and nausea increase and assisted BTB.  Pain Pain Assessment Pain Assessment: 0-10 Pain Score: 6  Pain Type: Surgical pain Pain Location: Abdomen Pain Orientation: Left Pain Descriptors / Indicators: Cramping Pain Onset: On-going Patients Stated Pain Goal: 3 Multiple Pain Sites: No Home Living/Prior Functioning Home Living Available Help at Discharge: Family (56yo dtr HS available at night, 2 sisters retired and avail during day) Type of Home: Mobile home Home Access: Stairs to enter [employer] of Steps: 4 to 5 steps to deck and 1 landing/1/2 step to enter Entrance Stairs-Rails: Right;Left;Can reach both Home Layout: One level Additional Comments: lives alone for the past 6 months when his 85 yo dtr moved in with her boyfriend. Dtr plans to return home and pt's sisters to provide supervision during the day (lives alone and HS daugh will move back in.  )  Lives With: Alone Prior Function Level of Independence: Independent with basic ADLs  Able to Take Stairs?: Reciprically Driving: Yes Comments:  (Has been on social security disability x 9 months) Vision/Perception  Vision - History Baseline Vision: Wears glasses only for reading Visual History:  ("I think I have a little bit of glaucoma") Vision - Assessment Eye Alignment: Within Functional Limits Perception Perception: Within Functional Limits  Cognition Overall Cognitive Status: Within Functional Limits for tasks assessed Orientation Level: Oriented X4 Attention: Selective Sensation Sensation Light Touch: Appears Intact (R ankle/toes have diminished sensation) Proprioception: Appears Intact Coordination Gross Motor Movements are Fluid and Coordinated: No Fine Motor Movements are Fluid and Coordinated: No Heel Shin Test: R side impaired moderately Motor  Motor Motor: Hemiplegia Motor - Skilled Clinical Observations: slow movements  Mobility Bed  Mobility Rolling Right: 4: Min assist;With rail Rolling Left: 5: Supervision;With rail Right Sidelying to Sit: 4: Min assist;HOB flat;With rails Sitting - Scoot to Edge of Bed: 4: Min guard Sit to Sidelying Right: 3: Mod assist;HOB flat Scooting to HOB: 5: Supervision;With rail Transfers Transfers: Yes Sit to Stand: 4: Min assist Sit to Stand Details: Verbal cues for technique Stand to Sit: 4: Min assist Stand Pivot Transfers: 4: Min assist Locomotion  Ambulation Ambulation: No (blood pressure drop to 89/59 with HR 159 post transfer oob) Gait Gait: No (deferred due to drop in BP and increase in HR OOB sitting)  Trunk/Postural Assessment  Lumbar Assessment Lumbar Assessment: Exceptions to [facility] (flexed in sitting with decreased extension control)  Balance Balance Balance Assessed: Yes Static Sitting Balance Static Sitting - Balance Support: Feet supported Static Sitting - Level of Assistance: 5: Stand by assistance Dynamic Sitting Balance Dynamic Sitting - Balance Support: Feet supported Dynamic Sitting - Level of Assistance: 4: Min assist Dynamic Sitting - Balance Activities: Lateral lean/weight shifting;Forward lean/weight shifting Static Standing Balance Static Standing - Balance Support: Left upper extremity supported Static Standing - Level of Assistance: 3: Mod assist Dynamic Standing Balance Dynamic Standing - Balance Support: Left upper extremity supported Dynamic Standing - Level of Assistance: 3: Mod assist Dynamic Standing - Balance Activities: Lateral lean/weight shifting Extremity Assessment  RLE Assessment RLE Assessment: Exceptions to [facility] (MMT: R LE grssly 2/5, ROM hip, knee and ankle WFL) LLE Assessment LLE Assessment: Within Functional Limits  FIM:  FIM - Bed/Chair Transfer Bed/Chair Transfer: 3: Supine > Sit: Mod A (lifting assist/Pt. 50-74%/lift 2 legs;3: Sit > Supine: Mod A (lifting assist/Pt. 50-74%/lift 2 legs);3: Bed > Chair or W/C: Mod A (lift or  lower  assist);3: Chair or W/C > Bed: Mod A (lift or lower assist) FIM - Locomotion: Ambulation Locomotion: Ambulation: 0: Activity did not occur FIM - Locomotion: Stairs Locomotion: Stairs: 0: Activity did not occur   Refer to Care Plan for Long Term Goals  Recommendations for other services: None  Discharge Criteria: Patient will be discharged from PT if patient refuses treatment 3 consecutive times without medical reason, if treatment goals not met, if there is a change in medical status, if patient makes no progress towards goals or if patient is discharged from hospital.  The above assessment, treatment plan, treatment alternatives and goals were discussed and mutually agreed upon: by patient  Gearldene Fiorenza J 09/06/2013, 6:00 PM

## 2013-09-06 NOTE — Progress Notes (Signed)
Huck Ashworth is a 58 y.o. male 1956-05-31 [id number]  Subjective: C/o nausea and abd distention persistent post-op. C/o dry mouth. C/o bad GERD. Vitamins make his stomach upset...  Objective: Vital signs in last 24 hours: Temp:  [97.8 F (36.6 C)-99.3 F (37.4 C)] 98.9 F (37.2 C) (02/28 0625) Pulse Rate:  [84-95] 95 (02/28 0625) Resp:  [16-18] 16 (02/28 0625) BP: (102-121)/(68-76) 121/76 mmHg (02/28 0827) SpO2:  [90 %-95 %] 90 % (02/28 0625) Weight:  [187 lb 13.3 oz (85.2 kg)] 187 lb 13.3 oz (85.2 kg) (02/27 1530) Weight change:  Last BM Date: 09/04/13 (per report)  Intake/Output from previous day: 02/27 0701 - 02/28 0700 In: 240 [P.O.:240] Out: -  Last cbgs: CBG (last 3)  No results found for this basename: GLUCAP,  in the last 72 hours   Physical Exam General: No apparent distress   HEENT: dryish Lungs: Normal effort. Lungs clear to auscultation, no crackles or wheezes. Cardiovascular: Regular rate and rhythm, no edema Abdomen: S/NT/distended; BS(+) sluggish Musculoskeletal:  unchanged Neurological: No new neurological deficits Wounds: N/A    Skin: clear  Aging changes Mental state: Alert, oriented, cooperative    Lab Results: BMET    Component Value Date/Time   NA 131* 09/02/2013 1716   K 3.9 09/02/2013 1716   CL 91* 09/02/2013 1716   CO2 24 09/02/2013 1716   GLUCOSE 94 09/02/2013 1716   BUN 9 09/02/2013 1716   CREATININE 1.00 09/05/2013 1700   CALCIUM 8.5 09/02/2013 1716   GFRNONAA 83* 09/05/2013 1700   GFRAA >90 09/05/2013 1700   CBC    Component Value Date/Time   WBC 7.5 09/05/2013 1700   RBC 3.23* 09/05/2013 1700   HGB 10.9* 09/05/2013 1700   HCT 31.1* 09/05/2013 1700   PLT 185 09/05/2013 1700   MCV 96.3 09/05/2013 1700   MCH 33.7 09/05/2013 1700   MCHC 35.0 09/05/2013 1700   RDW 12.2 09/05/2013 1700   LYMPHSABS 0.6* 12/10/2012 0100   MONOABS 0.5 12/10/2012 0100   EOSABS 0.2 12/10/2012 0100   BASOSABS 0.0 12/10/2012 0100    Studies/Results: No results  found.  Medications: I have reviewed the patient's current medications.  Assessment/Plan:  1. 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 DVT Prophylaxis/Anticoagulation: Subcutaneous heparin. Monitor platelet counts any signs of bleeding  3. Pain Management: MSIR 15 mg eve severe painry 4 hours as needed  4. Mood: Klonopin 0.5 mg daily. Provide emotional support  5. GERD: see Rx 6. Robotic radical prostatectomy 08/29/2013 per Dr. Jasmine December urology services. Keep Foley catheter tube x10 days. JP drain removed 09/05/2013  7. Hypertension. Cardura 8 mg daily, Lasix 20 mg daily, lisinopril 40 mg daily, Lopressor 50 mg twice a day. Monitor with increased mobility  8. Incidental findings of right ICA aneurysm. Plan followup outpatient with repeat imaging in one year 9. Nausea. ?etiology. Will review meds 10. Dry mouth - will review meds     Length of stay, days: 1  Walker Kehr , MD 09/06/2013, 9:03 AM

## 2013-09-06 NOTE — Progress Notes (Signed)
Physical Therapy Session Note  Patient Details  Name: Tanner [name] MRN: [id number] Date of Birth: 05-04-1956  Today's Date: 09/06/2013 Time: 2979-8921 Time Calculation: 45 minutes  Short Term Goals: Week 1:  PT Short Term Goal 1 (Week 1): Patient will be able to perform bed mobility with Mod-Independence. PT Short Term Goal 2 (Week 1): Patient will be able to perform transfers with min-assist. PT Short Term Goal 3 (Week 1): Patient will be able to ambulate with LRAD x 100 feet with min-assist. PT Short Term Goal 4 (Week 1): Patient will be able to ascend/descend 5 steps with Mod-assist.  Skilled Therapeutic Interventions/Progress Updates:  Ambulation/gait training;Balance/vestibular training;DME/adaptive equipment instruction;Functional mobility training;Neuromuscular re-education;Patient/family education;Stair training;Therapeutic Activities;Therapeutic Exercise;UE/LE Strength taining/ROM;UE/LE Coordination activities   Therapy Documentation Precautions:  Precautions Precautions: Fall Precaution Comments:  (R side weakness) Restrictions Weight Bearing Restrictions: No Vital Signs: (see below in Therapeutic Activity section) Pain: Abdominal pain shifts as patient still has not had significant BM in days.  Therapeutic Exercise:(15') B LE stretching, ROM and resisted leg presses Therapeutic Activity:(30')  Monitoring of BP/HR/O2 sats through tx session (see below)... Bed mobility with min-assist for rolling and Mod-assist for scooting to HOB, supine to sit with Mod-assist to R side. Scooting to EOB with min-guard. Transfers sit<->stand with Mod-assist and bed<->w/c with Mod-assist via stand-pivot transfer.  Vitals:   BP  HR O2 Sats Supine: 113/73  92    98 Sit   97/65  108 98 (c/o Nausea and increasing lightheadedness) BTB  119/74  99 98 Nursing reports that MD orders to hold BP meds.   See FIM for current functional status  Therapy/Group: Individual Therapy  Clearence Ped 09/06/2013, 6:13 PM

## 2013-09-07 ENCOUNTER — Inpatient Hospital Stay (HOSPITAL_COMMUNITY): Payer: Medicaid Other

## 2013-09-07 DIAGNOSIS — K567 Ileus, unspecified: Secondary | ICD-10-CM

## 2013-09-07 DIAGNOSIS — N390 Urinary tract infection, site not specified: Secondary | ICD-10-CM

## 2013-09-07 DIAGNOSIS — E871 Hypo-osmolality and hyponatremia: Secondary | ICD-10-CM

## 2013-09-07 HISTORY — DX: Ileus, unspecified: K56.7

## 2013-09-07 LAB — BASIC METABOLIC PANEL
BUN: 23 mg/dL (ref 6–23)
CALCIUM: 8.3 mg/dL — AB (ref 8.4–10.5)
CO2: 24 meq/L (ref 19–32)
Chloride: 85 mEq/L — ABNORMAL LOW (ref 96–112)
Creatinine, Ser: 1.52 mg/dL — ABNORMAL HIGH (ref 0.50–1.35)
GFR, EST AFRICAN AMERICAN: 58 mL/min — AB (ref >90)
GFR, EST NON AFRICAN AMERICAN: 50 mL/min — AB (ref >90)
Glucose, Bld: 114 mg/dL — ABNORMAL HIGH (ref 70–99)
Potassium: 3 mEq/L — ABNORMAL LOW (ref 3.7–5.3)
SODIUM: 130 meq/L — AB (ref 137–147)

## 2013-09-07 MED ORDER — FLEET ENEMA 7-19 GM/118ML RE ENEM
1.0000 | ENEMA | Freq: Once | RECTAL | Status: AC
  Administered 2013-09-07: 1 via RECTAL
  Filled 2013-09-07: qty 1

## 2013-09-07 MED ORDER — METOPROLOL TARTRATE 25 MG PO TABS
25.0000 mg | ORAL_TABLET | Freq: Two times a day (BID) | ORAL | Status: DC
  Administered 2013-09-07 – 2013-09-10 (×5): 25 mg via ORAL
  Filled 2013-09-07 (×9): qty 1

## 2013-09-07 MED ORDER — TRAZODONE HCL 50 MG PO TABS
50.0000 mg | ORAL_TABLET | Freq: Every evening | ORAL | Status: DC | PRN
  Administered 2013-09-07: 50 mg via ORAL
  Filled 2013-09-07: qty 1

## 2013-09-07 MED ORDER — POTASSIUM CHLORIDE 10 MEQ/100ML IV SOLN
10.0000 meq | Freq: Once | INTRAVENOUS | Status: AC
  Administered 2013-09-07: 10 meq via INTRAVENOUS
  Filled 2013-09-07: qty 100

## 2013-09-07 MED ORDER — POTASSIUM CHLORIDE 10 MEQ/100ML IV SOLN
10.0000 meq | INTRAVENOUS | Status: AC
  Administered 2013-09-07: 10 meq via INTRAVENOUS
  Filled 2013-09-07 (×2): qty 100

## 2013-09-07 MED ORDER — FUROSEMIDE 10 MG/ML IJ SOLN
20.0000 mg | Freq: Once | INTRAMUSCULAR | Status: AC
  Administered 2013-09-07: 20 mg via INTRAVENOUS
  Filled 2013-09-07: qty 2

## 2013-09-07 NOTE — Progress Notes (Signed)
Tanner [name] is a 58 y.o. male February 03, 1956 [id number]  Subjective: Pt had a fever spike yesterday. He was found to have a UTI and we started him on Zosyn IV. Feeling about the same today. C/o lower abd tightness. He has been passing gas, had a BM yesterday. C/o nausea and abd distention persistent post-op. C/o dry mouth. F/u GERD. Vitamins make his stomach upset - we stopped them  Objective: Vital signs in last 24 hours: Temp:  [98.6 F (37 C)-100.2 F (37.9 C)] 98.6 F (37 C) (03/01 0618) Pulse Rate:  [97-114] 114 (03/01 0618) Resp:  [18-19] 18 (03/01 0618) BP: (99-112)/(64-75) 99/66 mmHg (03/01 0618) SpO2:  [89 %-98 %] 94 % (03/01 0618) Weight change:  Last BM Date: 10/02/13  Intake/Output from previous day: 02/28 0701 - 03/01 0700 In: -  Out: 700 [Urine:700] Last cbgs: CBG (last 3)  No results found for this basename: GLUCAP,  in the last 72 hours   Physical Exam General: appears chronically ill HEENT: dryish Lungs: Normal effort. Lungs clear to auscultation, no crackles or wheezes. Cardiovascular: Regular rate and rhythm, no edema Abdomen: S/NT/distended; BS(+) sluggish Musculoskeletal:  unchanged Neurological: No new neurological deficits Wounds: N/A    Skin: clear  Aging changes Mental state: Alert, oriented, cooperative    Lab Results: BMET    Component Value Date/Time   NA 130* 09/07/2013 0702   K 3.0* 09/07/2013 0702   CL 85* 09/07/2013 0702   CO2 24 09/07/2013 0702   GLUCOSE 114* 09/07/2013 0702   BUN 23 09/07/2013 0702   CREATININE 1.52* 09/07/2013 0702   CALCIUM 8.3* 09/07/2013 0702   GFRNONAA 50* 09/07/2013 0702   GFRAA 58* 09/07/2013 0702   CBC    Component Value Date/Time   WBC 11.1* 09/06/2013 1040   RBC 3.32* 09/06/2013 1040   HGB 11.1* 09/06/2013 1040   HCT 31.9* 09/06/2013 1040   PLT 235 09/06/2013 1040   MCV 96.1 09/06/2013 1040   MCH 33.4 09/06/2013 1040   MCHC 34.8 09/06/2013 1040   RDW 12.1 09/06/2013 1040   LYMPHSABS 0.6* 12/10/2012 0100   MONOABS 0.5  12/10/2012 0100   EOSABS 0.2 12/10/2012 0100   BASOSABS 0.0 12/10/2012 0100    Studies/Results: Dg Chest 2 View  09/06/2013   CLINICAL DATA:  Stroke with chest pain.  EXAM: CHEST  2 VIEW  COMPARISON:  08/25/2013  FINDINGS: Heart, mediastinum and hila are unremarkable.  Lung volumes are low. Allowing for this, lungs are clear. No pleural effusion or pneumothorax. The bony thorax is demineralized but grossly intact.  IMPRESSION: No active cardiopulmonary disease.   Electronically Signed   By: Lajean Manes M.D.   On: 09/06/2013 21:46   Dg Abd 1 View  09/07/2013   CLINICAL DATA:  Abdominal pain and distention.  EXAM: ABDOMEN - 1 VIEW  COMPARISON:  MR PROSTATE W / WO CM dated 06/17/2013; DG CHEST 2 VIEW dated 09/06/2013  FINDINGS: Gaseous distended small and large bowel, the cecum is distended at 13 cm. No intra-abdominal mass effect or pathologic calcifications. Apparent rectal tube in place. Skin staples seen along the abdomen. Limited assessment for free air on these supine views.  IMPRESSION: Gaseous distended small and large bowel, cecum measures up to 13 cm, constellation of findings suggests ileus.   Electronically Signed   By: Elon Alas   On: 09/07/2013 06:01   Ct Head Wo Contrast  09/06/2013   CLINICAL DATA:  Rule out extension of stroke.  EXAM: CT HEAD WITHOUT  CONTRAST  TECHNIQUE: Contiguous axial images were obtained from the base of the skull through the vertex without intravenous contrast.  COMPARISON:  MRI brain 09/02/2013 and prior CT 09/02/2013.  FINDINGS: Ventricles, cisterns and other CSF spaces are within normal. Examination demonstrates evidence of patient's known acute focal infarct over the left periventricular white matter just above the posterior limb of the internal capsule measuring 1.2 x 1.9 cm. This is not significantly changed. There is no focal mass, mass effect, shift of midline structures or acute hemorrhage. Remainder the exam is unchanged.  IMPRESSION: Stable small focal  acute infarct in the left periventricular white matter just above the posterior limb of the left internal capsule measuring 1.2 x 1.9 mm.   Electronically Signed   By: Marin Olp M.D.   On: 09/06/2013 19:52    Medications: I have reviewed the patient's current medications.  Assessment/Plan:  1. 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 DVT Prophylaxis/Anticoagulation: Subcutaneous heparin. Monitor platelet counts any signs of bleeding  3. Pain Management: MSIR 15 mg eve severe pain every 4 hours as needed. IV MS prn 4. Mood: Klonopin 0.5 mg daily. Provide emotional support  5. GERD: see Rx 6. Robotic radical prostatectomy 08/29/2013 per Dr. Jasmine December urology services. Keep Foley catheter tube x10 days. JP drain removed 09/05/2013  7. Hypertension. Cardura 8 mg daily, Lasix 20 mg daily, lisinopril 40 mg daily, Lopressor 50 mg twice a day. Monitor with increased mobility  8. Incidental findings of right ICA aneurysm. Plan followup outpatient with repeat imaging in one year 9. Nausea. ?etiology. Will review meds 10. Dry mouth - will review meds 11. Pt had a fever spike yesterday. He was found to have a UTI and we started him on Zosyn IV. 12. Hypokalemia - will correct IV 13. Ileus, partial. He is able to pass gas, have a BM. Treat UTI     Length of stay, days: 2  Walker Kehr , MD 09/07/2013, 8:31 AM

## 2013-09-07 NOTE — Progress Notes (Signed)
Small sips of liquid this shift. Refused to eat anything from his meal tray. Diagnosis of partial ileus. Currently on clear liquid diet. Suppository given at 1000 with no results. Prior to administration of suppository, clear liquid noted to be seeping from rectum.   Order received for a fleet enema to be followed by a soap sud enema. Fleet enema given @ 1625 with small results of dark green stool. Pt became nauseated due to stool's odor, and vomited approximate 75 ml of emesis. Small white pill noted in basin. Appeared to be an undigested aspirin absent the coating. Pt reports small relief after enema.  4x4 dressing to LLQ incision that was placed during the night, was 100% saturated with serous drainage. New dressing applied to site, resulting in scant amount of drainage on dressing throughout shift.   Pt dozing throughout shift. Requested IV Zofran q 6hrs. Requested order for sleep medication for tonight. Dr. Alain Marion contacted. Order received for Trazodone 50 mg q hs, prn.

## 2013-09-07 NOTE — Progress Notes (Addendum)
Patient alert and oriented with complaints of discomfort to abdomen. Abdomen distended and firm with right lateral incision drainage. Patient with nausea and vomiting green color moderate amount x 3.Oxygen saturations decreased to 88%. Patient placed on 2 liters O2. Foley catheter to straight drain with some urine leakage around meatus.  Dr. Leanne Chang made aware. Please see orders. adm

## 2013-09-07 NOTE — Plan of Care (Signed)
Problem: RH BOWEL ELIMINATION Goal: RH STG MANAGE BOWEL WITH ASSISTANCE STG Manage Bowel with Assistance. Mod I  Outcome: Not Progressing Patient had small amount results from fleets enema previous shift. adm

## 2013-09-08 ENCOUNTER — Inpatient Hospital Stay (HOSPITAL_COMMUNITY): Payer: Medicaid Other | Admitting: Physical Therapy

## 2013-09-08 ENCOUNTER — Inpatient Hospital Stay (HOSPITAL_COMMUNITY): Payer: Medicaid Other | Admitting: Occupational Therapy

## 2013-09-08 ENCOUNTER — Encounter (HOSPITAL_COMMUNITY): Payer: Medicaid Other | Admitting: Occupational Therapy

## 2013-09-08 ENCOUNTER — Inpatient Hospital Stay (HOSPITAL_COMMUNITY): Payer: Medicaid Other

## 2013-09-08 DIAGNOSIS — I6330 Cerebral infarction due to thrombosis of unspecified cerebral artery: Secondary | ICD-10-CM

## 2013-09-08 DIAGNOSIS — G8110 Spastic hemiplegia affecting unspecified side: Secondary | ICD-10-CM

## 2013-09-08 LAB — CBC WITH DIFFERENTIAL/PLATELET
BASOS ABS: 0 10*3/uL (ref 0.0–0.1)
Basophils Relative: 0 % (ref 0–1)
Eosinophils Absolute: 0 10*3/uL (ref 0.0–0.7)
Eosinophils Relative: 0 % (ref 0–5)
HCT: 31.4 % — ABNORMAL LOW (ref 39.0–52.0)
Hemoglobin: 11.3 g/dL — ABNORMAL LOW (ref 13.0–17.0)
LYMPHS ABS: 0.4 10*3/uL — AB (ref 0.7–4.0)
LYMPHS PCT: 5 % — AB (ref 12–46)
MCH: 33.7 pg (ref 26.0–34.0)
MCHC: 36 g/dL (ref 30.0–36.0)
MCV: 93.7 fL (ref 78.0–100.0)
Monocytes Absolute: 1.1 10*3/uL — ABNORMAL HIGH (ref 0.1–1.0)
Monocytes Relative: 14 % — ABNORMAL HIGH (ref 3–12)
NEUTROS ABS: 6.7 10*3/uL (ref 1.7–7.7)
NEUTROS PCT: 81 % — AB (ref 43–77)
PLATELETS: 330 10*3/uL (ref 150–400)
RBC: 3.35 MIL/uL — AB (ref 4.22–5.81)
RDW: 12.5 % (ref 11.5–15.5)
WBC: 8.3 10*3/uL (ref 4.0–10.5)

## 2013-09-08 LAB — COMPREHENSIVE METABOLIC PANEL
ALK PHOS: 48 U/L (ref 39–117)
ALT: 8 U/L (ref 0–53)
AST: 11 U/L (ref 0–37)
Albumin: 2.1 g/dL — ABNORMAL LOW (ref 3.5–5.2)
BUN: 20 mg/dL (ref 6–23)
CO2: 25 meq/L (ref 19–32)
Calcium: 8.7 mg/dL (ref 8.4–10.5)
Chloride: 87 mEq/L — ABNORMAL LOW (ref 96–112)
Creatinine, Ser: 1.13 mg/dL (ref 0.50–1.35)
GFR calc non Af Amer: 71 mL/min — ABNORMAL LOW (ref >90)
GFR, EST AFRICAN AMERICAN: 83 mL/min — AB (ref >90)
Glucose, Bld: 119 mg/dL — ABNORMAL HIGH (ref 70–99)
POTASSIUM: 2.8 meq/L — AB (ref 3.7–5.3)
SODIUM: 131 meq/L — AB (ref 137–147)
Total Bilirubin: 0.5 mg/dL (ref 0.3–1.2)
Total Protein: 6.1 g/dL (ref 6.0–8.3)

## 2013-09-08 LAB — BASIC METABOLIC PANEL
BUN: 20 mg/dL (ref 6–23)
CALCIUM: 8.9 mg/dL (ref 8.4–10.5)
CO2: 26 meq/L (ref 19–32)
CREATININE: 1.24 mg/dL (ref 0.50–1.35)
Chloride: 89 mEq/L — ABNORMAL LOW (ref 96–112)
GFR calc non Af Amer: 64 mL/min — ABNORMAL LOW (ref >90)
GFR, EST AFRICAN AMERICAN: 74 mL/min — AB (ref >90)
Glucose, Bld: 115 mg/dL — ABNORMAL HIGH (ref 70–99)
Potassium: 3.5 mEq/L — ABNORMAL LOW (ref 3.7–5.3)
Sodium: 134 mEq/L — ABNORMAL LOW (ref 137–147)

## 2013-09-08 LAB — URINE CULTURE

## 2013-09-08 MED ORDER — PROMETHAZINE HCL 25 MG/ML IJ SOLN: 25.0000 mg | Freq: Four times a day (QID) | INTRAMUSCULAR | Status: DC | PRN

## 2013-09-08 MED ORDER — METOCLOPRAMIDE HCL 5 MG/ML IJ SOLN
10.0000 mg | Freq: Four times a day (QID) | INTRAMUSCULAR | Status: AC
  Administered 2013-09-08 – 2013-09-10 (×8): 10 mg via INTRAVENOUS
  Filled 2013-09-08 (×8): qty 2

## 2013-09-08 MED ORDER — POTASSIUM CHLORIDE 10 MEQ/100ML IV SOLN
10.0000 meq | Freq: Once | INTRAVENOUS | Status: AC
  Administered 2013-09-08: 10 meq via INTRAVENOUS
  Filled 2013-09-08: qty 100

## 2013-09-08 MED ORDER — ONDANSETRON HCL 4 MG PO TABS: 4.0000 mg | ORAL_TABLET | ORAL | Status: DC | PRN

## 2013-09-08 MED ORDER — ONDANSETRON 8 MG/NS 50 ML IVPB
8.0000 mg | Freq: Four times a day (QID) | INTRAVENOUS | Status: DC | PRN
  Administered 2013-09-08 (×2): 8 mg via INTRAVENOUS
  Filled 2013-09-08 (×3): qty 8

## 2013-09-08 NOTE — Progress Notes (Signed)
Physical Therapy Session Note  Patient Details  Name: Tanner [name] MRN: [id number] Date of Birth: 01-28-1956  Today's Date: 09/08/2013  Skilled Therapeutic Interventions/Progress Updates:   Pt missed 60 + 45 min PT secondary to continued bowel issues (ileus) with nausea and vomiting of emesis.  Pt very fatigued.  Will f/u tomorrow.  Therapy Documentation Precautions:  Precautions Precautions: Fall Precaution Comments: R sided hemiparesis Restrictions Weight Bearing Restrictions: No General: Amount of Missed PT Time (min): 60 and 45 Minutes Missed Time Reason: Patient ill (comment);Patient fatigue (nausea, vomiting emesis, fatigue)  [name] Faucette 09/08/2013, 1:14 PM

## 2013-09-08 NOTE — Progress Notes (Signed)
Pt continues to persists with nausea and vomiting x 4. Initial episode was after pt return from x-ray. Moderate amount of dark black liquid in basin, which transporter had saved for nurse to observe. Silvestre Mesi, PA made aware.Linna Hoff stated that he was awaiting results of x-ray, and would write orders based on x-ray results.   X-ray results obtained. Linna Hoff indicated that he contacted GI. Order received for rectal tube insertion, and change in medication regiment. Pt unable to take any po medication today, even being unable to keep down small sips of water. Stated that he experienced dry heaves with mouth rinsing, and vomiting after consuming a sip of water. Continuous belching throughout shift. Requesting basin within reach at all times.  Rectal tube inserted per order. Pt reports no discomfort. GI to see pt tomorrow. Oncoming staff to continue to monitor .

## 2013-09-08 NOTE — Progress Notes (Signed)
Subjective/Complaints: 59 y.o. right-handed male with history of hypertension as well as alcoholic hepatitis. Admitted 08/29/2012 with workup for recent prostate cancer followed by urology services and underwent robotic-assisted laparoscopic radical prostatectomy 08/29/2013 per Dr. Margarita Grizzle. Presented with new onset of right side weakness. MRI of the brain   showed acute nonhemorrhagic infarct extending from posterior left lenticular nucleus to posterior left corona radiata. Incidental finding of right ICA aneurysm with planned workup as outpatient. Echocardiogram with ejection fraction of 65% and no wall motion abnormalities. Carotid Dopplers with no ICA stenosis  Fevers elevated over weekend but improved with IV Zosyn Nausea and vomiting had BM after fleets yest  Review of Systems - Negative except abd pain, nausea, vomiting Objective: Vital Signs: Blood pressure 121/80, pulse 99, temperature 99.1 F (37.3 C), temperature source Oral, resp. rate 18, height 5\' 10"  (1.778 m), weight 85.2 kg (187 lb 13.3 oz), SpO2 90.00%. Dg Chest 2 View  09/06/2013   CLINICAL DATA:  Stroke with chest pain.  EXAM: CHEST  2 VIEW  COMPARISON:  08/25/2013  FINDINGS: Heart, mediastinum and hila are unremarkable.  Lung volumes are low. Allowing for this, lungs are clear. No pleural effusion or pneumothorax. The bony thorax is demineralized but grossly intact.  IMPRESSION: No active cardiopulmonary disease.   Electronically Signed   By: 08/27/2013 M.D.   On: 09/06/2013 21:46   Dg Abd 1 View  09/07/2013   ADDENDUM REPORT: 09/07/2013 14:48  ADDENDUM: Initial report by Dr. 11/07/2013. Addendum by Dr. Karie Kirks. The reported rectal catheter is actually a bladder catheter, per discussion with the patient's nurse.   Electronically Signed   By: Reche Dixon M.D.   On: 09/07/2013 14:48   09/07/2013   CLINICAL DATA:  Abdominal pain and distention.  EXAM: ABDOMEN - 1 VIEW  COMPARISON:  MR PROSTATE W / WO CM dated 06/17/2013; DG CHEST 2  VIEW dated 09/06/2013  FINDINGS: Gaseous distended small and large bowel, the cecum is distended at 13 cm. No intra-abdominal mass effect or pathologic calcifications. Apparent rectal tube in place. Skin staples seen along the abdomen. Limited assessment for free air on these supine views.  IMPRESSION: Gaseous distended small and large bowel, cecum measures up to 13 cm, constellation of findings suggests ileus.  Electronically Signed: By: 09/08/2013 On: 09/07/2013 06:01   Ct Head Wo Contrast  09/06/2013   CLINICAL DATA:  Rule out extension of stroke.  EXAM: CT HEAD WITHOUT CONTRAST  TECHNIQUE: Contiguous axial images were obtained from the base of the skull through the vertex without intravenous contrast.  COMPARISON:  MRI brain 09/02/2013 and prior CT 09/02/2013.  FINDINGS: Ventricles, cisterns and other CSF spaces are within normal. Examination demonstrates evidence of patient's known acute focal infarct over the left periventricular white matter just above the posterior limb of the internal capsule measuring 1.2 x 1.9 cm. This is not significantly changed. There is no focal mass, mass effect, shift of midline structures or acute hemorrhage. Remainder the exam is unchanged.  IMPRESSION: Stable small focal acute infarct in the left periventricular white matter just above the posterior limb of the left internal capsule measuring 1.2 x 1.9 mm.   Electronically Signed   By: 09/04/2013 M.D.   On: 09/06/2013 19:52   Results for orders placed during the hospital encounter of 09/05/13 (from the past 72 hour(s))  CBC     Status: Abnormal   Collection Time    09/05/13  5:00 PM      Result  Value Ref Range   WBC 7.5  4.0 - 10.5 K/uL   RBC 3.23 (*) 4.22 - 5.81 MIL/uL   Hemoglobin 10.9 (*) 13.0 - 17.0 g/dL   HCT 31.1 (*) 39.0 - 52.0 %   MCV 96.3  78.0 - 100.0 fL   MCH 33.7  26.0 - 34.0 pg   MCHC 35.0  30.0 - 36.0 g/dL   RDW 12.2  11.5 - 15.5 %   Platelets 185  150 - 400 K/uL  CREATININE, SERUM      Status: Abnormal   Collection Time    09/05/13  5:00 PM      Result Value Ref Range   Creatinine, Ser 1.00  0.50 - 1.35 mg/dL   GFR calc non Af Amer 83 (*) >90 mL/min   GFR calc Af Amer >90  >90 mL/min   Comment: (NOTE)     The eGFR has been calculated using the CKD EPI equation.     This calculation has not been validated in all clinical situations.     eGFR's persistently <90 mL/min signify possible Chronic Kidney     Disease.  LIPASE, BLOOD     Status: None   Collection Time    09/06/13 10:40 AM      Result Value Ref Range   Lipase 14  11 - 59 U/L  COMPREHENSIVE METABOLIC PANEL     Status: Abnormal   Collection Time    09/06/13 10:40 AM      Result Value Ref Range   Sodium 127 (*) 137 - 147 mEq/L   Potassium 3.8  3.7 - 5.3 mEq/L   Chloride 85 (*) 96 - 112 mEq/L   CO2 27  19 - 32 mEq/L   Glucose, Bld 122 (*) 70 - 99 mg/dL   BUN 13  6 - 23 mg/dL   Creatinine, Ser 1.36 (*) 0.50 - 1.35 mg/dL   Calcium 8.6  8.4 - 10.5 mg/dL   Total Protein 6.3  6.0 - 8.3 g/dL   Albumin 2.4 (*) 3.5 - 5.2 g/dL   AST 15  0 - 37 U/L   ALT 9  0 - 53 U/L   Alkaline Phosphatase 52  39 - 117 U/L   Total Bilirubin 0.6  0.3 - 1.2 mg/dL   GFR calc non Af Amer 57 (*) >90 mL/min   GFR calc Af Amer 66 (*) >90 mL/min   Comment: (NOTE)     The eGFR has been calculated using the CKD EPI equation.     This calculation has not been validated in all clinical situations.     eGFR's persistently <90 mL/min signify possible Chronic Kidney     Disease.  CBC     Status: Abnormal   Collection Time    09/06/13 10:40 AM      Result Value Ref Range   WBC 11.1 (*) 4.0 - 10.5 K/uL   RBC 3.32 (*) 4.22 - 5.81 MIL/uL   Hemoglobin 11.1 (*) 13.0 - 17.0 g/dL   HCT 31.9 (*) 39.0 - 52.0 %   MCV 96.1  78.0 - 100.0 fL   MCH 33.4  26.0 - 34.0 pg   MCHC 34.8  30.0 - 36.0 g/dL   RDW 12.1  11.5 - 15.5 %   Platelets 235  150 - 400 K/uL  URINALYSIS, ROUTINE W REFLEX MICROSCOPIC     Status: Abnormal   Collection Time     09/06/13  8:15 PM      Result Value  Ref Range   Color, Urine AMBER (*) YELLOW   Comment: BIOCHEMICALS MAY BE AFFECTED BY COLOR   APPearance CLOUDY (*) CLEAR   Specific Gravity, Urine 1.017  1.005 - 1.030   pH 5.0  5.0 - 8.0   Glucose, UA NEGATIVE  NEGATIVE mg/dL   Hgb urine dipstick LARGE (*) NEGATIVE   Bilirubin Urine LARGE (*) NEGATIVE   Ketones, ur 15 (*) NEGATIVE mg/dL   Protein, ur 30 (*) NEGATIVE mg/dL   Urobilinogen, UA 1.0  0.0 - 1.0 mg/dL   Nitrite NEGATIVE  NEGATIVE   Leukocytes, UA MODERATE (*) NEGATIVE  URINE MICROSCOPIC-ADD ON     Status: Abnormal   Collection Time    09/06/13  8:15 PM      Result Value Ref Range   WBC, UA 21-50  <3 WBC/hpf   RBC / HPF 21-50  <3 RBC/hpf   Casts HYALINE CASTS (*) NEGATIVE   Comment: GRANULAR CAST   Urine-Other AMORPHOUS URATES/PHOSPHATES    LACTIC ACID, PLASMA     Status: None   Collection Time    09/06/13 10:51 PM      Result Value Ref Range   Lactic Acid, Venous 0.9  0.5 - 2.2 mmol/L  BASIC METABOLIC PANEL     Status: Abnormal   Collection Time    09/07/13  7:02 AM      Result Value Ref Range   Sodium 130 (*) 137 - 147 mEq/L   Potassium 3.0 (*) 3.7 - 5.3 mEq/L   Chloride 85 (*) 96 - 112 mEq/L   CO2 24  19 - 32 mEq/L   Glucose, Bld 114 (*) 70 - 99 mg/dL   BUN 23  6 - 23 mg/dL   Creatinine, Ser 1.52 (*) 0.50 - 1.35 mg/dL   Calcium 8.3 (*) 8.4 - 10.5 mg/dL   GFR calc non Af Amer 50 (*) >90 mL/min   GFR calc Af Amer 58 (*) >90 mL/min   Comment: (NOTE)     The eGFR has been calculated using the CKD EPI equation.     This calculation has not been validated in all clinical situations.     eGFR's persistently <90 mL/min signify possible Chronic Kidney     Disease.  CBC WITH DIFFERENTIAL     Status: Abnormal   Collection Time    09/08/13  6:45 AM      Result Value Ref Range   WBC 8.3  4.0 - 10.5 K/uL   RBC 3.35 (*) 4.22 - 5.81 MIL/uL   Hemoglobin 11.3 (*) 13.0 - 17.0 g/dL   HCT 31.4 (*) 39.0 - 52.0 %   MCV 93.7  78.0 -  100.0 fL   MCH 33.7  26.0 - 34.0 pg   MCHC 36.0  30.0 - 36.0 g/dL   RDW 12.5  11.5 - 15.5 %   Platelets 330  150 - 400 K/uL   Comment: REPEATED TO VERIFY     SPECIMEN CHECKED FOR CLOTS   Neutrophils Relative % 81 (*) 43 - 77 %   Neutro Abs 6.7  1.7 - 7.7 K/uL   Lymphocytes Relative 5 (*) 12 - 46 %   Lymphs Abs 0.4 (*) 0.7 - 4.0 K/uL   Monocytes Relative 14 (*) 3 - 12 %   Monocytes Absolute 1.1 (*) 0.1 - 1.0 K/uL   Eosinophils Relative 0  0 - 5 %   Eosinophils Absolute 0.0  0.0 - 0.7 K/uL   Basophils Relative 0  0 - 1 %  Basophils Absolute 0.0  0.0 - 0.1 K/uL     HEENT: normal Cardio: RRR Resp: CTA B/L GI: BS negative, Tender and Distention Extremity:  No Edema Skin:   Wound C/D/I Neuro: Flat, Cranial Nerve II-XII normal and Abnormal Motor Right side 3-/5 Left side 5/5 Musc/Skel:  Normal Gen nauseated   Assessment/Plan: 1. Functional deficits secondary to Left corona radiata thrombotic infarct which require 3+ hours per day of interdisciplinary therapy in a comprehensive inpatient rehab setting. Physiatrist is providing close team supervision and 24 hour management of active medical problems listed below. Physiatrist and rehab team continue to assess barriers to discharge/monitor patient progress toward functional and medical goals. FIM: FIM - Bathing Bathing Steps Patient Completed: Chest;Right Arm;Abdomen;Right upper leg;Left upper leg Bathing: 3: Mod-Patient completes 5-7 41f10 parts or 50-74%  FIM - Upper Body Dressing/Undressing Upper body dressing/undressing: 0: Wears gown/pajamas-no public clothing FIM - Lower Body Dressing/Undressing Lower body dressing/undressing: 0: Wears gInterior and spatial designer    FIM - TAir cabin crewTransfers: 0-Activity did not occur  FIM - BControl and instrumentation engineerDevices: Bed rails Bed/Chair Transfer: 3: Sit > Supine: Mod A (lifting assist/Pt. 50-74%/lift 2 legs);3: Bed > Chair or W/C: Mod A  (lift or lower assist);3: Chair or W/C > Bed: Mod A (lift or lower assist);4: Supine > Sit: Min A (steadying Pt. > 75%/lift 1 leg)  FIM - Locomotion: Ambulation Locomotion: Ambulation: 0: Activity did not occur  Comprehension Comprehension Mode: Auditory Comprehension: 7-Follows complex conversation/direction: With no assist  Expression Expression Mode: Verbal Expression: 7-Expresses complex ideas: With no assist  Social Interaction Social Interaction: 5-Interacts appropriately 90% of the time - Needs monitoring or encouragement for participation or interaction.  Problem Solving Problem Solving: 5-Solves complex 90% of the time/cues < 10% of the time  Memory Memory: 5-Recognizes or recalls 90% of the time/requires cueing < 10% of the time  Medical Problem List and Plan:  1. 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 DVT Prophylaxis/Anticoagulation: Subcutaneous heparin. Monitor platelet counts any signs of bleeding  3. Pain Management: MSIR 15 mg eve severe painry 4 hours as needed  4. Mood: Klonopin 0.5 mg daily. Provide emotional support  5. Neuropsych: This patient is capable of making decisions on his own behalf.  6. Robotic radical prostatectomy 08/29/2013 per Dr. WJasmine Decemberurology services. Keep Foley catheter tube x10 days. JP drain removed 09/05/2013  7. Hypertension. Cardura 8 mg daily, Lasix 20 mg daily, lisinopril 40 mg daily, Lopressor 50 mg twice a day. Monitor with increased mobility  8. Incidental findings of right ICA aneurysm. Plan followup outpatient with repeat imaging in one year  9.  Post op ileus, KUB, clear liq, IVF  LOS (Days) 3 A FACE TO FACE EVALUATION WAS PERFORMED  KIRSTEINS,ANDREW E 09/08/2013, 8:18 AM

## 2013-09-08 NOTE — Progress Notes (Signed)
Occupational Therapy Note  Patient Details  Name: Tanner [name] MRN: [id number] Date of Birth: 1955-08-31 Today's Date: 09/08/2013  Pt missed 30 mins skilled OT secondary to continued bowel issues (ileus) with nausea and vomiting of emesis. Pt very fatigued. Will f/u tomorrow.  Simonne Come 09/08/2013, 2:23 PM

## 2013-09-08 NOTE — Progress Notes (Signed)
Social Work Assessment and Plan Social Work Assessment and Plan  Patient Details  Name: Tanner [name] MRN: [id number] Date of Birth: 07/13/1955  Today's Date: 09/08/2013  Problem List:  Patient Active Problem List   Diagnosis Date Noted  . CVA (cerebral infarction) 09/05/2013  . 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 Malignant neoplasm of prostate 08/29/2013  . Prostate cancer 05/22/2013  . Hyponatremia 12/11/2012    Class: Chronic  . Ankle fracture 12/10/2012   Past Medical History:  Past Medical History  Diagnosis Date  . Alcoholic hepatitis   . Hypertension   . 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 Shortness of breath     new onset- occasionally-at rest and exertion  . GERD (gastroesophageal reflux disease)   . Anxiety    Past Surgical History:  Past Surgical History  Procedure Laterality Date  . Hand surgery Right   . Hernia repair    . Orif ankle fracture Right 12/10/2012    Procedure: OPEN REDUCTION INTERNAL FIXATION (ORIF) ANKLE FRACTURE;  Surgeon: Hessie Dibble, MD;  Location: [facility];  Service: Orthopedics;  Laterality: Right;  . Robot assisted laparoscopic radical prostatectomy N/A 08/29/2013    Procedure: ROBOTIC ASSISTED LAPAROSCOPIC RADICAL PROSTATECTOMY LEVEL 2, Lysis of adhesions;  Surgeon: Molli Hazard, MD;  Location: [facility];  Service: Urology;  Laterality: N/A;  . Lymphadenectomy Bilateral 08/29/2013    Procedure: LYMPHADENECTOMY "BILATERAL PELVIC LYMPH NODE DISSECTION";  Surgeon: Molli Hazard, MD;  Location: [facility];  Service: Urology;  Laterality: Bilateral;   Social History:  reports that he quit smoking about 8 months ago. He has never used smokeless tobacco. He reports that he drinks alcohol. He reports that he uses illicit drugs.  Family / Support Systems Marital Status: Divorced How Long?: 15 years Patient Roles: Other (Comment);Parent (Sibling) Children: Samatha-daughter  [phone]-cell Other Supports: [name]  Allred-sister  [phone]-home  [phone]   Tempie Peoples-sister  [phone]-cell Anticipated Caregiver: Sister's and daughter at nighttime Ability/Limitations of Caregiver: Sister's are limited with the physical care they can provide pt, daughter attends [employer] during the day Caregiver Availability: 24/7 Family Dynamics: Pt raised his daughter all by himself, his sister's have always been supportive and involved.  [name] is here visiting and reports they will do what he needs, they are committed to him.  They have a small family but dedicated to one another.  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  He has always taken care of himself and wants to recover form this and be independent again.  His sister vouches he has always been the one to help others.  He is motivated and hopefully once feels better he will be able to participate in therapies more and make more progress. Recent Psychosocial Issues: Other health issues-his scoliosis bothers him, but he has managed with it and remained independent Pyschiatric History: No history deferred depression screen today due to vomiting and not feeling well.  WIll try tomorrow and continue to assess his coping, he may benefit from Neuro-psych  seeing him while he is here. Substance Abuse History: No issues  Patient / Family Perceptions,  Expectations & Goals Pt/Family understanding of illness & functional limitations: Pt and sister are able to explain his surgery and treamtent plan.  Sister visits daily and she can tell he is making progress from when he began.  She is hopeful he will do well here.  Pt discusses his questions and concerns with MD daily and feels they are being addressed.  He would like to see his PCP and sister will call his office to let him know pt is here, to see if he will visit him on rehab. Premorbid pt/family roles/activities: Father, Brother, Retiree, Home owner, etc Anticipated changes in roles/activities/participation: resume Pt/family expectations/goals: Pt states; " I want to be able to do for myself, before I leave here.  I feel like I have lost two days being sick."  Sister states; " I hope he can physically do for himself, since we can't do much physical care ."  [employer]: None Premorbid Home Care/DME Agencies: None Transportation available at discharge: Family Resource referrals recommended: Support group (specify) ([facility] Support group)  Discharge Planning Living Arrangements: [place]: Children;Other relatives;Friends/neighbors Type of Residence: Private residence Insurance Resources: Medicaid (specify county) ([place]) Museum/gallery curator Resources: SSD Financial Screen Referred: No Living Expenses: Own Money Management: Patient Does the patient have any problems obtaining your medications?: No Home Management: Self Patient/Family Preliminary Plans: Daughter had recently moved out to be with her boyfriend, now she plans to be with pt at night.  Pt's two sister's plan to provide the daytime care and together with his daughter have someone there with him at discharge.  This is not a long term plan, just until pt can be alone and do for himself. Social Work Anticipated Follow Up Needs: HH/OP;Support Group  Clinical Impression Pleasant gentleman who is  physically ill today but still trying to attend therapies and make progress.  His sister is supportive and will be assisting in his care, aware will need to attend therapies With pt closer to his discharge.  Pt aware team conference Wed, he wants to make sure he is ready to go home when team sets target date, especially with his illness the past two days. Pt seems to coping appropriately and is motivated to improve.  Work on a safe discharge plan.  Elease Hashimoto 09/08/2013, 1:40 PM

## 2013-09-08 NOTE — Care Management Note (Signed)
Inpatient [facility] Individual Statement of Services  Patient Name:  Tanner [name]  Date:  09/08/2013  Welcome to the [facility].  Our goal is to provide you with an individualized program based on your diagnosis and situation, designed to meet your specific needs.  With this comprehensive rehabilitation program, you will be expected to participate in at least 3 hours of rehabilitation therapies Monday-Friday, with modified therapy programming on the weekends.  Your rehabilitation program will include the following services:  Physical Therapy (PT), Occupational Therapy (OT), Speech Therapy (ST), 24 hour per day rehabilitation nursing, Neuropsychology, Case Management (Social Worker), Rehabilitation Medicine, Nutrition Services and Pharmacy Services  Weekly team conferences will be held on Wednesday to discuss your progress.  Your Social Worker will talk with you frequently to get your input and to update you on team discussions.  Team conferences with you and your family in attendance may also be held.  Expected length of stay: 14-16 days  Overall anticipated outcome: supervision with set-up  Depending on your progress and recovery, your program may change. Your Social Worker will coordinate services and will keep you informed of any changes. Your Social Worker's name and contact numbers are listed  below.  The following services may also be recommended but are not provided by the [facility] will be made to provide these services after discharge if needed.  Arrangements include referral to agencies that provide these services.  Your insurance has been verified to be:  Medicaid Your primary doctor is:  Dr Levin Erp  Pertinent information will be shared with your doctor and your insurance company.  Social Worker:  Ovidio Kin, [place] or ([phone]  Information discussed with and copy given to patient by: Elease Hashimoto, 09/08/2013, 1:22 PM

## 2013-09-08 NOTE — Progress Notes (Signed)
Patient had 4 Bm"s at HS. Stools dark orange/brownish color. Second stool was brown with third and fourth stool dark brown/ black color.  All stools were loose. Emesis x 3  Black color with foul odor. Patients abdominal distension decreased. Right lateral incision with yellow drainage-moderate amount. Tanner [name] notified. adm

## 2013-09-08 NOTE — IPOC Note (Signed)
Overall Plan of Care [facility]) Patient Details Name: Swain Acree MRN: [id number] DOB: [birth year]  Admitting Diagnosis: LEFT CVA  Hospital Problems: Active Problems:   CVA (cerebral infarction)     Functional Problem List: Nursing Bladder;Bowel;Edema;Medication Management;Nutrition;Skin Integrity  PT Balance;Endurance;Motor;Safety  OT Balance;Endurance;Motor;Pain;Safety  SLP    TR         Basic ADL's: OT Grooming;Bathing;Dressing;Toileting;Eating     Advanced  ADL's: OT Simple Meal Preparation     Transfers: PT Bed Mobility;Bed to Chair;Car;Furniture;Floor  OT Toilet;Tub/Shower     Locomotion: PT Ambulation;Stairs     Additional Impairments: OT Fuctional Use of Upper Extremity  SLP        TR      Anticipated Outcomes Item Anticipated Outcome  Self Feeding independent using RUE  Swallowing      Basic self-care  supervision  Toileting  supervision   Bathroom Transfers supervision  Bowel/Bladder  cont of bowel and bladder  Transfers  S/Mod-Independent  Locomotion  S/Mod-Independent using LRAD  Communication     Cognition     Pain  less than 2  Safety/Judgment  adhere to safety protocol   Therapy Plan: PT Intensity: Minimum of 1-2 x/day ,45 to 90 minutes PT Frequency: 5 out of 7 days PT Duration Estimated Length of Stay: 14-16 days OT Intensity: Minimum of 1-2 x/day, 45 to 90 minutes OT Frequency: 5 out of 7 days OT Duration/Estimated Length of Stay: 14-16 days         Team Interventions: Nursing Interventions Patient/Family Education;Bladder Management;Bowel Management;Disease Management/Prevention;Skin Care/Wound Management;Medication Management;Pain Management;Discharge Planning;Psychosocial Support  PT interventions Ambulation/gait training;Balance/vestibular training;DME/adaptive equipment instruction;Functional mobility training;Neuromuscular re-education;Patient/family education;Stair training;Therapeutic Activities;Therapeutic  Exercise;UE/LE Strength taining/ROM;UE/LE Coordination activities  OT Interventions Balance/vestibular training;Community reintegration;Discharge planning;DME/adaptive equipment instruction;Functional mobility training;Neuromuscular re-education;Pain management;Patient/family education;Self Care/advanced ADL retraining;Therapeutic Activities;Therapeutic Exercise;UE/LE Strength taining/ROM;UE/LE Coordination activities;Wheelchair propulsion/positioning  SLP Interventions    TR Interventions    SW/CM Interventions      Team Discharge Planning: Destination: PT-Home ,OT- Home , SLP-  Projected Follow-up: PT-Home health PT, OT-  Home health OT, SLP-  Projected Equipment Needs: PT- (To be determined for LRAD as patient able to participate with Rehab.), OT-  (says he has a tub seat but does not fit in his tub or shower), SLP-  Equipment Details: PT- , OT-  Patient/family involved in discharge planning: PT- Patient;Family member/caregiver,  OT-Patient;Family member/caregiver (daughter- Aldona Bar), SLP-   MD ELOS: 16-22d Medical Rehab Prognosis:  Good Assessment: 58 y.o. right-handed male with history of hypertension as well as alcoholic hepatitis. Admitted 08/29/2012 with workup for recent prostate cancer followed by urology services and underwent robotic-assisted laparoscopic radical prostatectomy 08/29/2013 per Dr. Jasmine December. Presented with new onset of right side weakness. MRI of the brain showed acute nonhemorrhagic infarct extending from posterior left lenticular nucleus to posterior left corona radiata   Now requiring 24/7 Rehab RN,MD, as well as CIR level PT, OT and SLP.  Treatment team will focus on ADLs and mobility with goals set at [facility]  See Team Conference Notes for weekly updates to the plan of care

## 2013-09-08 NOTE — Progress Notes (Signed)
PA on call notified that pt has heart rated of 123 no shortness of breath or chest pain but has scheduled 25 mg metoprolol po scheduled at 2000 pt was having dry heaves at that time ask if metoprolol could be changed IV. PA not sure if cardiac monitoring would be needed to change medication to IV. Pt was given 8 mg of Zofran IV before metoprolol and medication was given at later time and was tolerated. Will continue to monitor Arthor Captain LPN

## 2013-09-08 NOTE — Progress Notes (Signed)
Occupational Therapy Note  Patient Details  Name: Tanner [name] MRN: [id number] Date of Birth: 1955/10/11 Today's Date: 09/08/2013  Pt missed 60 mins skilled OT treatment session secondary to not feeling well due to multiple BMs and vomiting episodes overnight and this AM.  Pt also reports having little to no sleep due to above and requesting to attempt therapy at a later time.  Order in chart for imaging due to abdominal distension and N/V.  Plan to follow up as able.  Simonne Come 09/08/2013, 9:48 AM

## 2013-09-09 ENCOUNTER — Inpatient Hospital Stay (HOSPITAL_COMMUNITY): Payer: Medicaid Other | Admitting: Occupational Therapy

## 2013-09-09 ENCOUNTER — Inpatient Hospital Stay (HOSPITAL_COMMUNITY): Payer: Medicaid Other | Admitting: Physical Therapy

## 2013-09-09 ENCOUNTER — Inpatient Hospital Stay (HOSPITAL_COMMUNITY): Payer: Medicaid Other

## 2013-09-09 DIAGNOSIS — K9189 Other postprocedural complications and disorders of digestive system: Secondary | ICD-10-CM

## 2013-09-09 DIAGNOSIS — G8110 Spastic hemiplegia affecting unspecified side: Secondary | ICD-10-CM

## 2013-09-09 DIAGNOSIS — K560 Paralytic ileus: Secondary | ICD-10-CM

## 2013-09-09 DIAGNOSIS — K567 Ileus, unspecified: Secondary | ICD-10-CM | POA: Diagnosis not present

## 2013-09-09 DIAGNOSIS — E876 Hypokalemia: Secondary | ICD-10-CM

## 2013-09-09 DIAGNOSIS — K929 Disease of digestive system, unspecified: Secondary | ICD-10-CM

## 2013-09-09 DIAGNOSIS — I6330 Cerebral infarction due to thrombosis of unspecified cerebral artery: Secondary | ICD-10-CM

## 2013-09-09 DIAGNOSIS — R933 Abnormal findings on diagnostic imaging of other parts of digestive tract: Secondary | ICD-10-CM

## 2013-09-09 LAB — CBC WITH DIFFERENTIAL/PLATELET
BASOS PCT: 0 % (ref 0–1)
Basophils Absolute: 0 10*3/uL (ref 0.0–0.1)
Eosinophils Absolute: 0 10*3/uL (ref 0.0–0.7)
Eosinophils Relative: 0 % (ref 0–5)
HEMATOCRIT: 30.4 % — AB (ref 39.0–52.0)
Hemoglobin: 10.7 g/dL — ABNORMAL LOW (ref 13.0–17.0)
LYMPHS PCT: 13 % (ref 12–46)
Lymphs Abs: 1.2 10*3/uL (ref 0.7–4.0)
MCH: 33.2 pg (ref 26.0–34.0)
MCHC: 35.2 g/dL (ref 30.0–36.0)
MCV: 94.4 fL (ref 78.0–100.0)
MONO ABS: 0.4 10*3/uL (ref 0.1–1.0)
Monocytes Relative: 5 % (ref 3–12)
NEUTROS ABS: 7.7 10*3/uL (ref 1.7–7.7)
Neutrophils Relative %: 82 % — ABNORMAL HIGH (ref 43–77)
Platelets: 361 10*3/uL (ref 150–400)
RBC: 3.22 MIL/uL — ABNORMAL LOW (ref 4.22–5.81)
RDW: 12.5 % (ref 11.5–15.5)
WBC: 9.4 10*3/uL (ref 4.0–10.5)

## 2013-09-09 LAB — BASIC METABOLIC PANEL
BUN: 22 mg/dL (ref 6–23)
CALCIUM: 8.7 mg/dL (ref 8.4–10.5)
CO2: 25 meq/L (ref 19–32)
CREATININE: 1.28 mg/dL (ref 0.50–1.35)
Chloride: 91 mEq/L — ABNORMAL LOW (ref 96–112)
GFR calc Af Amer: 71 mL/min — ABNORMAL LOW (ref >90)
GFR calc non Af Amer: 61 mL/min — ABNORMAL LOW (ref >90)
GLUCOSE: 128 mg/dL — AB (ref 70–99)
Potassium: 2.7 mEq/L — CL (ref 3.7–5.3)
SODIUM: 136 meq/L — AB (ref 137–147)

## 2013-09-09 MED ORDER — PANTOPRAZOLE SODIUM 40 MG IV SOLR
40.0000 mg | INTRAVENOUS | Status: DC
  Administered 2013-09-09 – 2013-09-10 (×2): 40 mg via INTRAVENOUS
  Filled 2013-09-09 (×3): qty 40

## 2013-09-09 MED ORDER — CEFAZOLIN SODIUM 1-5 GM-% IV SOLN
1.0000 g | Freq: Three times a day (TID) | INTRAVENOUS | Status: DC
  Filled 2013-09-09 (×3): qty 50

## 2013-09-09 MED ORDER — CEFAZOLIN SODIUM 1-5 GM-% IV SOLN
1.0000 g | Freq: Three times a day (TID) | INTRAVENOUS | Status: DC
  Administered 2013-09-09 – 2013-09-10 (×4): 1 g via INTRAVENOUS
  Filled 2013-09-09 (×6): qty 50

## 2013-09-09 MED ORDER — POTASSIUM CHLORIDE 10 MEQ/100ML IV SOLN
10.0000 meq | INTRAVENOUS | Status: AC
  Administered 2013-09-09 (×2): 10 meq via INTRAVENOUS
  Filled 2013-09-09 (×2): qty 100

## 2013-09-09 MED ORDER — TRAMADOL HCL 50 MG PO TABS: 50.0000 mg | ORAL_TABLET | Freq: Four times a day (QID) | ORAL | Status: DC | PRN

## 2013-09-09 MED ORDER — SODIUM CHLORIDE 0.9 % IJ SOLN
10.0000 mL | INTRAMUSCULAR | Status: DC | PRN
  Administered 2013-09-10: 10 mL

## 2013-09-09 MED ORDER — POTASSIUM CHLORIDE 10 MEQ/100ML IV SOLN
10.0000 meq | INTRAVENOUS | Status: AC
  Filled 2013-09-09 (×2): qty 100

## 2013-09-09 MED ORDER — DEXTROSE-NACL 5-0.9 % IV SOLN
INTRAVENOUS | Status: DC
  Administered 2013-09-09: 14:00:00 via INTRAVENOUS

## 2013-09-09 MED ORDER — BACLOFEN 10 MG PO TABS
10.0000 mg | ORAL_TABLET | Freq: Three times a day (TID) | ORAL | Status: DC
  Administered 2013-09-09 – 2013-09-10 (×3): 10 mg via ORAL
  Filled 2013-09-09 (×7): qty 1

## 2013-09-09 NOTE — Plan of Care (Signed)
Problem: RH BOWEL ELIMINATION Goal: RH STG MANAGE BOWEL WITH ASSISTANCE STG Manage Bowel with Assistance. Mod I  Outcome: Not Progressing Ileus- Rectal tube  Problem: RH BLADDER ELIMINATION Goal: RH STG MANAGE BLADDER WITH ASSISTANCE STG Manage Bladder With Assistance. Min A  Outcome: Not Progressing Foley

## 2013-09-09 NOTE — Progress Notes (Signed)
Subjective/Complaints: 58 y.o. right-handed male with history of hypertension as well as alcoholic hepatitis. Admitted 08/29/2012 with workup for recent prostate cancer followed by urology services and underwent robotic-assisted laparoscopic radical prostatectomy 08/29/2013 per Dr. Jasmine December. Presented with new onset of right side weakness. MRI of the brain   showed acute nonhemorrhagic infarct extending from posterior left lenticular nucleus to posterior left corona radiata. Incidental finding of right ICA aneurysm with planned workup as outpatient. Echocardiogram with ejection fraction of 65% and no wall motion abnormalities. Carotid Dopplers with no ICA stenosis  Still with nausea and occ vomiting Also c/o hiccups Has rectal tube  Review of Systems - Negative except abd pain, nausea, vomiting Objective: Vital Signs: Blood pressure 142/96, pulse 88, temperature 99 F (37.2 C), temperature source Oral, resp. rate 19, height 5' 10"  (1.778 m), weight 85.2 kg (187 lb 13.3 oz), SpO2 91.00%. Dg Abd 1 View  09/08/2013   CLINICAL DATA:  Abdominal distention, nausea/vomiting  EXAM: ABDOMEN - 1 VIEW  COMPARISON:  09/07/2013  FINDINGS: Diffusely dilated loops of small and large bowel, favored to reflect adynamic ileus, unchanged.  Cecum measures 13.0 cm, previously 13.4 cm, grossly unchanged.  Skin staples overlying the lower abdomen.  Degenerative changes of the lumbar spine.  IMPRESSION: Diffusely dilated loops of small and large bowel, favored to reflect adynamic ileus, unchanged.   Electronically Signed   By: Julian Hy M.D.   On: 09/08/2013 13:42   Results for orders placed during the hospital encounter of 09/05/13 (from the past 72 hour(s))  LIPASE, BLOOD     Status: None   Collection Time    09/06/13 10:40 AM      Result Value Ref Range   Lipase 14  11 - 59 U/L  COMPREHENSIVE METABOLIC PANEL     Status: Abnormal   Collection Time    09/06/13 10:40 AM      Result Value Ref Range   Sodium  127 (*) 137 - 147 mEq/L   Potassium 3.8  3.7 - 5.3 mEq/L   Chloride 85 (*) 96 - 112 mEq/L   CO2 27  19 - 32 mEq/L   Glucose, Bld 122 (*) 70 - 99 mg/dL   BUN 13  6 - 23 mg/dL   Creatinine, Ser 1.36 (*) 0.50 - 1.35 mg/dL   Calcium 8.6  8.4 - 10.5 mg/dL   Total Protein 6.3  6.0 - 8.3 g/dL   Albumin 2.4 (*) 3.5 - 5.2 g/dL   AST 15  0 - 37 U/L   ALT 9  0 - 53 U/L   Alkaline Phosphatase 52  39 - 117 U/L   Total Bilirubin 0.6  0.3 - 1.2 mg/dL   GFR calc non Af Amer 57 (*) >90 mL/min   GFR calc Af Amer 66 (*) >90 mL/min   Comment: (NOTE)     The eGFR has been calculated using the CKD EPI equation.     This calculation has not been validated in all clinical situations.     eGFR's persistently <90 mL/min signify possible Chronic Kidney     Disease.  CBC     Status: Abnormal   Collection Time    09/06/13 10:40 AM      Result Value Ref Range   WBC 11.1 (*) 4.0 - 10.5 K/uL   RBC 3.32 (*) 4.22 - 5.81 MIL/uL   Hemoglobin 11.1 (*) 13.0 - 17.0 g/dL   HCT 31.9 (*) 39.0 - 52.0 %   MCV 96.1  78.0 -  100.0 fL   MCH 33.4  26.0 - 34.0 pg   MCHC 34.8  30.0 - 36.0 g/dL   RDW 12.1  11.5 - 15.5 %   Platelets 235  150 - 400 K/uL  CULTURE, BLOOD (ROUTINE X 2)     Status: None   Collection Time    09/06/13  6:33 PM      Result Value Ref Range   Specimen Description BLOOD RIGHT ARM     Special Requests BOTTLES DRAWN AEROBIC AND ANAEROBIC 5CC     Culture  Setup Time       Value: 09/07/2013 02:57     Performed at [facility]   Culture       Value:        BLOOD CULTURE RECEIVED NO GROWTH TO DATE CULTURE WILL BE HELD FOR 5 DAYS BEFORE ISSUING A FINAL NEGATIVE REPORT     Performed at [facility]   Report Status PENDING    CULTURE, BLOOD (ROUTINE X 2)     Status: None   Collection Time    09/06/13  6:45 PM      Result Value Ref Range   Specimen Description BLOOD RIGHT HAND     Special Requests BOTTLES DRAWN AEROBIC AND ANAEROBIC 5CC     Culture  Setup Time       Value: 09/07/2013  02:57     Performed at [facility]   Culture       Value:        BLOOD CULTURE RECEIVED NO GROWTH TO DATE CULTURE WILL BE HELD FOR 5 DAYS BEFORE ISSUING A FINAL NEGATIVE REPORT     Performed at [facility]   Report Status PENDING    URINALYSIS, ROUTINE W REFLEX MICROSCOPIC     Status: Abnormal   Collection Time    09/06/13  8:15 PM      Result Value Ref Range   Color, Urine AMBER (*) YELLOW   Comment: BIOCHEMICALS MAY BE AFFECTED BY COLOR   APPearance CLOUDY (*) CLEAR   Specific Gravity, Urine 1.017  1.005 - 1.030   pH 5.0  5.0 - 8.0   Glucose, UA NEGATIVE  NEGATIVE mg/dL   Hgb urine dipstick LARGE (*) NEGATIVE   Bilirubin Urine LARGE (*) NEGATIVE   Ketones, ur 15 (*) NEGATIVE mg/dL   Protein, ur 30 (*) NEGATIVE mg/dL   Urobilinogen, UA 1.0  0.0 - 1.0 mg/dL   Nitrite NEGATIVE  NEGATIVE   Leukocytes, UA MODERATE (*) NEGATIVE  URINE CULTURE     Status: None   Collection Time    09/06/13  8:15 PM      Result Value Ref Range   Specimen Description URINE, RANDOM     Special Requests BAG     Culture  Setup Time       Value: 09/07/2013 03:12     Performed at [facility] Count       Value: 55,000 COLONIES/ML     Performed at [facility]   Culture       Value: ESCHERICHIA COLI     Performed at [facility]   Report Status 09/08/2013 FINAL     Organism ID, Bacteria ESCHERICHIA COLI    URINE MICROSCOPIC-ADD ON     Status: Abnormal   Collection Time    09/06/13  8:15 PM      Result Value Ref Range   WBC, UA 21-50  <3 WBC/hpf  RBC / HPF 21-50  <3 RBC/hpf   Casts HYALINE CASTS (*) NEGATIVE   Comment: GRANULAR CAST   Urine-Other AMORPHOUS URATES/PHOSPHATES    LACTIC ACID, PLASMA     Status: None   Collection Time    09/06/13 10:51 PM      Result Value Ref Range   Lactic Acid, Venous 0.9  0.5 - 2.2 mmol/L  BASIC METABOLIC PANEL     Status: Abnormal   Collection Time    09/07/13  7:02 AM      Result Value Ref Range    Sodium 130 (*) 137 - 147 mEq/L   Potassium 3.0 (*) 3.7 - 5.3 mEq/L   Chloride 85 (*) 96 - 112 mEq/L   CO2 24  19 - 32 mEq/L   Glucose, Bld 114 (*) 70 - 99 mg/dL   BUN 23  6 - 23 mg/dL   Creatinine, Ser 1.52 (*) 0.50 - 1.35 mg/dL   Calcium 8.3 (*) 8.4 - 10.5 mg/dL   GFR calc non Af Amer 50 (*) >90 mL/min   GFR calc Af Amer 58 (*) >90 mL/min   Comment: (NOTE)     The eGFR has been calculated using the CKD EPI equation.     This calculation has not been validated in all clinical situations.     eGFR's persistently <90 mL/min signify possible Chronic Kidney     Disease.  CBC WITH DIFFERENTIAL     Status: Abnormal   Collection Time    09/08/13  6:45 AM      Result Value Ref Range   WBC 8.3  4.0 - 10.5 K/uL   RBC 3.35 (*) 4.22 - 5.81 MIL/uL   Hemoglobin 11.3 (*) 13.0 - 17.0 g/dL   HCT 31.4 (*) 39.0 - 52.0 %   MCV 93.7  78.0 - 100.0 fL   MCH 33.7  26.0 - 34.0 pg   MCHC 36.0  30.0 - 36.0 g/dL   RDW 12.5  11.5 - 15.5 %   Platelets 330  150 - 400 K/uL   Comment: REPEATED TO VERIFY     SPECIMEN CHECKED FOR CLOTS   Neutrophils Relative % 81 (*) 43 - 77 %   Neutro Abs 6.7  1.7 - 7.7 K/uL   Lymphocytes Relative 5 (*) 12 - 46 %   Lymphs Abs 0.4 (*) 0.7 - 4.0 K/uL   Monocytes Relative 14 (*) 3 - 12 %   Monocytes Absolute 1.1 (*) 0.1 - 1.0 K/uL   Eosinophils Relative 0  0 - 5 %   Eosinophils Absolute 0.0  0.0 - 0.7 K/uL   Basophils Relative 0  0 - 1 %   Basophils Absolute 0.0  0.0 - 0.1 K/uL  COMPREHENSIVE METABOLIC PANEL     Status: Abnormal   Collection Time    09/08/13  6:45 AM      Result Value Ref Range   Sodium 131 (*) 137 - 147 mEq/L   Potassium 2.8 (*) 3.7 - 5.3 mEq/L   Comment: CRITICAL RESULT CALLED TO, READ BACK BY AND VERIFIED WITH:     Janalyn Harder RN 09/08/13 0819 COSTELLO B   Chloride 87 (*) 96 - 112 mEq/L   CO2 25  19 - 32 mEq/L   Glucose, Bld 119 (*) 70 - 99 mg/dL   BUN 20  6 - 23 mg/dL   Creatinine, Ser 1.13  0.50 - 1.35 mg/dL   Calcium 8.7  8.4 - 10.5 mg/dL   Total  Protein  6.1  6.0 - 8.3 g/dL   Albumin 2.1 (*) 3.5 - 5.2 g/dL   AST 11  0 - 37 U/L   ALT 8  0 - 53 U/L   Alkaline Phosphatase 48  39 - 117 U/L   Total Bilirubin 0.5  0.3 - 1.2 mg/dL   GFR calc non Af Amer 71 (*) >90 mL/min   GFR calc Af Amer 83 (*) >90 mL/min   Comment: (NOTE)     The eGFR has been calculated using the CKD EPI equation.     This calculation has not been validated in all clinical situations.     eGFR's persistently <90 mL/min signify possible Chronic Kidney     Disease.  BASIC METABOLIC PANEL     Status: Abnormal   Collection Time    09/08/13 11:38 AM      Result Value Ref Range   Sodium 134 (*) 137 - 147 mEq/L   Potassium 3.5 (*) 3.7 - 5.3 mEq/L   Chloride 89 (*) 96 - 112 mEq/L   CO2 26  19 - 32 mEq/L   Glucose, Bld 115 (*) 70 - 99 mg/dL   BUN 20  6 - 23 mg/dL   Creatinine, Ser 1.24  0.50 - 1.35 mg/dL   Calcium 8.9  8.4 - 10.5 mg/dL   GFR calc non Af Amer 64 (*) >90 mL/min   GFR calc Af Amer 74 (*) >90 mL/min   Comment: (NOTE)     The eGFR has been calculated using the CKD EPI equation.     This calculation has not been validated in all clinical situations.     eGFR's persistently <90 mL/min signify possible Chronic Kidney     Disease.     HEENT: normal Cardio: RRR Resp: CTA B/L GI: BS negative, Tender and Distention Extremity:  No Edema Skin:   Wound C/D/I Neuro: Flat, Cranial Nerve II-XII normal and Abnormal Motor Right side 3-/5 Left side 5/5 Musc/Skel:  Normal Gen nauseated   Assessment/Plan: 1. Functional deficits secondary to Left corona radiata thrombotic infarct which require 3+ hours per day of interdisciplinary therapy in a comprehensive inpatient rehab setting. Physiatrist is providing close team supervision and 24 hour management of active medical problems listed below. Physiatrist and rehab team continue to assess barriers to discharge/monitor patient progress toward functional and medical goals. FIM: FIM - Bathing Bathing Steps Patient  Completed: Chest;Right Arm;Abdomen;Right upper leg;Left upper leg Bathing: 3: Mod-Patient completes 5-7 50f10 parts or 50-74%  FIM - Upper Body Dressing/Undressing Upper body dressing/undressing: 0: Wears gown/pajamas-no public clothing FIM - Lower Body Dressing/Undressing Lower body dressing/undressing: 0: Wears gInterior and spatial designer    FIM - TAir cabin crewTransfers: 0-Activity did not occur  FIM - BControl and instrumentation engineerDevices: Bed rails Bed/Chair Transfer: 3: Sit > Supine: Mod A (lifting assist/Pt. 50-74%/lift 2 legs);3: Bed > Chair or W/C: Mod A (lift or lower assist);3: Chair or W/C > Bed: Mod A (lift or lower assist);4: Supine > Sit: Min A (steadying Pt. > 75%/lift 1 leg)  FIM - Locomotion: Ambulation Locomotion: Ambulation: 0: Activity did not occur  Comprehension Comprehension Mode: Auditory Comprehension: 5-Understands complex 90% of the time/Cues < 10% of the time  Expression Expression Mode: Verbal Expression: 5-Expresses basic needs/ideas: With no assist  Social Interaction Social Interaction: 5-Interacts appropriately 90% of the time - Needs monitoring or encouragement for participation or interaction.  Problem Solving Problem Solving: 4-Solves basic 75 - 89% of the time/requires  cueing 10 - 24% of the time  Memory Memory: 5-Recognizes or recalls 90% of the time/requires cueing < 10% of the time  Medical Problem List and Plan:  1. 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 DVT Prophylaxis/Anticoagulation: Subcutaneous heparin. Monitor platelet counts any signs of bleeding  3. Pain Management: MSIR 15 mg eve severe painry 4 hours as needed, change to tramadol  To reduce ileus 4. Mood: Klonopin 0.5 mg daily. Provide emotional support  5. Neuropsych: This patient is capable of making decisions on his own behalf.  6. Robotic radical prostatectomy 08/29/2013 per Dr. Jasmine December urology  services. Keep Foley catheter tube x10 days. JP drain removed 09/05/2013, no drainage noted  7. Hypertension. Cardura 8 mg daily, Lasix 20 mg daily, lisinopril 40 mg daily, Lopressor 50 mg twice a day. Monitor with increased mobility  8. Incidental findings of right ICA aneurysm. Plan followup outpatient with repeat imaging in one year  9.  Post op ileus, KUB, clear liq, IVF  LOS (Days) 4 A FACE TO FACE EVALUATION WAS PERFORMED  Maston Wight E 09/09/2013, 6:15 AM

## 2013-09-09 NOTE — Consult Note (Signed)
Consultation  Referring Provider: Dr. Alysia Penna  Primary Care Physician:  No PCP Per Patient Primary Gastroenterologist: Verl Blalock, MD      Reason for Consultation:  Ileus          HPI:   Tanner [name] is a 58 y.o. male who is s/p laparoscopic radical prostatectomy 08/29/13 for prostate cancer. He was transferred to rehab on 2/27 for right sided weakness after a post-op CVA. Post-operatively patient also developed a diffuse ileus. Following rectal tube placement and addition of Reglan yesterday his KUB today has shown some decompression of cecum.   Tanner [name] is having intermittent nausea and vomiting. He vomited this am while getting KUB. He hasn't been able to ambulate or move around much because of recent CVA. He has been lying flat on his back in bed for the most part.  Having liquid stool in flexiseal.   Past Medical History  Diagnosis Date  . Alcoholic hepatitis   . Hypertension   . 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 Shortness of breath     new onset- occasionally-at rest and exertion  . GERD (gastroesophageal reflux disease)   . Anxiety     Past Surgical History  Procedure Laterality Date  . Hand surgery Right   . Hernia repair    . Orif ankle fracture Right 12/10/2012    Procedure: OPEN REDUCTION INTERNAL FIXATION (ORIF) ANKLE FRACTURE;  Surgeon: Hessie Dibble, MD;  Location: [facility];  Service: Orthopedics;  Laterality: Right;  . Robot assisted laparoscopic radical prostatectomy N/A 08/29/2013    Procedure: ROBOTIC ASSISTED LAPAROSCOPIC RADICAL PROSTATECTOMY LEVEL 2, Lysis of adhesions;  Surgeon: Molli Hazard, MD;  Location: [facility];  Service: Urology;  Laterality: N/A;  . Lymphadenectomy Bilateral 08/29/2013    Procedure: LYMPHADENECTOMY "BILATERAL PELVIC LYMPH NODE DISSECTION";  Surgeon: Molli Hazard, MD;  Location: [facility];  Service: Urology;  Laterality: Bilateral;    Family History  Problem Relation Age of Onset  . Pancreatic  cancer Sister   . Lung cancer Father      History  Substance Use Topics  . Smoking status: Former Smoker -- 1.50 packs/day for 20 years    Quit date: 12/21/2012  . Smokeless tobacco: Never Used  . Alcohol Use: 0.0 oz/week     Comment: 5 beers day    Prior to Admission medications   Medication Sig Start Date End Date Taking? Authorizing Provider  aspirin EC 81 MG EC tablet Take 1 tablet (81 mg total) by mouth daily. 09/05/13  Yes Molli Hazard, MD  ciprofloxacin (CIPRO) 500 MG tablet Take 1 tablet (500 mg total) by mouth 2 (two) times daily. Begin the day before you return to have your catheter removed. 08/29/13  Yes Molli Hazard, MD  clonazePAM (KLONOPIN) 0.5 MG tablet Take 0.5 mg by mouth 3 (three) times daily.    Yes Historical Provider, MD  furosemide (LASIX) 40 MG tablet Take 20 mg by mouth every morning.    Yes Historical Provider, MD  hyoscyamine (LEVSIN, ANASPAZ) 0.125 MG tablet Take 1 tablet (0.125 mg total) by mouth every 4 (four) hours as needed (bladder spasms). 08/29/13  Yes Molli Hazard, MD  lisinopril (PRINIVIL,ZESTRIL) 40 MG tablet Take 40 mg by mouth every morning.    Yes Historical Provider, MD  metoprolol (LOPRESSOR) 50 MG tablet Take 50 mg by mouth 2 (two) times daily.   Yes Historical Provider, MD  morphine (MSIR) 15 MG tablet  Take 1 tablet (15 mg total) by mouth every 4 (four) hours as needed for moderate pain or severe pain. 09/02/13  Yes Molli Hazard, MD  omeprazole (PRILOSEC) 40 MG capsule Take 40 mg by mouth 2 (two) times daily.    Yes Historical Provider, MD  ondansetron (ZOFRAN) 4 MG tablet Take 1 tablet (4 mg total) by mouth every 4 (four) hours as needed for nausea or vomiting. 09/02/13  Yes Molli Hazard, MD  oxybutynin (DITROPAN) 5 MG tablet Take 1 tablet (5 mg total) by mouth every 6 (six) hours as needed for bladder spasms. 08/29/13  Yes Molli Hazard, MD  senna-docusate (SENOKOT S) 8.6-50 MG per tablet Take 1  tablet by mouth 2 (two) times daily. 08/29/13  Yes Molli Hazard, MD    Current Facility-Administered Medications  Medication Dose Route Frequency Provider Last Rate Last Dose  . 0.9 %  sodium chloride infusion   Intravenous Continuous Lisabeth Pick, MD 50 mL/hr at 09/09/13 1147    . acetaminophen (TYLENOL) tablet 325-650 mg  325-650 mg Oral Q4H PRN Lavon Paganini Angiulli, PA-C      . aspirin EC tablet 81 mg  81 mg Oral Daily Lavon Paganini Angiulli, PA-C   81 mg at 09/09/13 1146  . baclofen (LIORESAL) tablet 10 mg  10 mg Oral TID [name], MD      . ceFAZolin (ANCEF) IVPB 1 g/50 mL premix  1 g Intravenous 3 times per day [name], MD      . clonazePAM Bobbye Charleston) tablet 0.5 mg  0.5 mg Oral Daily Lavon Paganini Angiulli, PA-C   0.5 mg at 09/07/13 1009  . doxazosin (CARDURA) tablet 8 mg  8 mg Oral Daily Lavon Paganini Angiulli, PA-C   8 mg at 09/07/13 2993  . heparin injection 5,000 Units  5,000 Units Subcutaneous 3 times per day Cathlyn Parsons, PA-C   5,000 Units at 09/09/13 7169  . hyoscyamine (LEVSIN SL) SL tablet 0.125 mg  0.125 mg Oral Q4H PRN Lavon Paganini Angiulli, PA-C      . metoCLOPramide (REGLAN) injection 10 mg  10 mg Intravenous 4 times per day Lavon Paganini Angiulli, PA-C   10 mg at 09/09/13 0502  . metoprolol tartrate (LOPRESSOR) tablet 25 mg  25 mg Oral BID Evie Lacks Plotnikov, MD   25 mg at 09/08/13 2135  . neomycin-bacitracin-polymyxin (NEOSPORIN) ointment   Topical TID PRN Gaynell Face Hiatt, RPH      . ondansetron (ZOFRAN) 8 mg/NS 50 ml IVPB  8 mg Intravenous Q6H PRN [name], MD   8 mg at 09/08/13 2016   Or  . ondansetron (ZOFRAN) tablet 4 mg  4 mg Oral Q4H PRN [name], MD      . pantoprazole (PROTONIX) injection 40 mg  40 mg Intravenous Q24H [name], MD      . potassium chloride 10 mEq in 100 mL IVPB  10 mEq Intravenous Q1 Hr x 2 [name], MD      . senna-docusate (Senokot-S) tablet 1 tablet  1 tablet Oral BID Lavon Paganini Angiulli, PA-C   1  tablet at 09/09/13 1145  . sodium chloride 0.9 % injection 10-40 mL  10-40 mL Intracatheter PRN [name], MD      . sorbitol 70 % solution 30 mL  30 mL Oral Daily PRN Lavon Paganini Angiulli, PA-C      . traMADol Veatrice Bourbon) tablet 50 mg  50 mg Oral  Q6H PRN [name], MD      . traZODone (DESYREL) tablet 50 mg  50 mg Oral QHS PRN Cassandria Anger, MD   50 mg at 09/07/13 2306    Allergies as of 09/05/2013 - Review Complete 09/05/2013  Allergen Reaction Noted  . Dilaudid [hydromorphone hcl] Nausea And Vomiting 08/25/2013  . Oxycodone Nausea And Vomiting 08/20/2013  . Procaine hcl  03/19/2009  . Sulfa antibiotics Rash 07/24/2013    Review of Systems:    All systems reviewed and negative except where noted in HPI.   Physical Exam:  Vital signs in last 24 hours: Temp:  [99 F (37.2 C)-99.5 F (37.5 C)] 99 F (37.2 C) (03/03 0528) Pulse Rate:  [88-123] 96 (03/03 0842) Resp:  [18-19] 19 (03/03 0528) BP: (118-144)/(77-99) 144/99 mmHg (03/03 0842) SpO2:  [91 %-92 %] 92 % (03/03 0842) Last BM Date: 09/09/13 (flexiseal) General:   Pleasant white male in NAD Head:  Normocephalic and atraumatic. Eyes:   No icterus.   Conjunctiva pink. Ears:  Normal auditory acuity. Neck:  Supple; no masses felt Lungs:  Respirations even and unlabored. Decreased breath sounds at bilateral bases.  No wheezes.  Heart:  Regular rate and rhythm; Abdomen:  Soft, largely distended, nontender. Hypoactive bowel sounds. Surgical staples present Rectal:  Not performed. Flexiseal in Msk:  Symmetrical without gross deformities.  Extremities:  Without edema. Neurologic:  Alert and  oriented x4;  grossly normal neurologically. Skin:  Intact without significant lesions or rashes. Cervical Nodes:  No significant cervical adenopathy. Psych:  Alert and cooperative. Normal affect.  LAB RESULTS:  Recent Labs  09/08/13 0645 09/09/13 0649  WBC 8.3 9.4  HGB 11.3* 10.7*  HCT 31.4* 30.4*  PLT 330 361    BMET  Recent Labs  09/08/13 0645 09/08/13 1138 09/09/13 0500  NA 131* 134* 136*  K 2.8* 3.5* 2.7*  CL 87* 89* 91*  CO2 25 26 25   GLUCOSE 119* 115* 128*  BUN 20 20 22   CREATININE 1.13 1.24 1.28  CALCIUM 8.7 8.9 8.7   LFT  Recent Labs  09/08/13 0645  PROT 6.1  ALBUMIN 2.1*  AST 11  ALT 8  ALKPHOS 48  BILITOT 0.5    STUDIES: Dg Abd 1 View  09/09/2013   CLINICAL DATA:  History of vomiting and and ileus  EXAM: ABDOMEN - 1 VIEW  COMPARISON:  DG ABD 1 VIEW dated 09/08/2013  FINDINGS: Multiple dilated loops of large and small bowel are again appreciated. There has been interval partial decompression of the cecum now measuring 10.4 cm in diameter. Otherwise no further change in the abdomen findings. Skin staples are identified within the lower portions of the abdomen. There is mild levoscoliosis of the lumbar spine likely positional. Degenerative changes appreciated within the lower lumbar spine.  IMPRESSION: Findings again consistent with an adynamic ileus. There is been a component of partial decompression of the cecum. Continued surveillance evaluation recommended.   Electronically Signed   By: Margaree Mackintosh M.D.   On: 09/09/2013 08:04   Dg Abd 1 View  09/08/2013   CLINICAL DATA:  Abdominal distention, nausea/vomiting  EXAM: ABDOMEN - 1 VIEW  COMPARISON:  09/07/2013  FINDINGS: Diffusely dilated loops of small and large bowel, favored to reflect adynamic ileus, unchanged.  Cecum measures 13.0 cm, previously 13.4 cm, grossly unchanged.  Skin staples overlying the lower abdomen.  Degenerative changes of the lumbar spine.  IMPRESSION: Diffusely dilated loops of small and large bowel, favored to reflect  adynamic ileus, unchanged.   Electronically Signed   By: Julian Hy M.D.   On: 09/08/2013 13:42    PREVIOUS ENDOSCOPIES:            Screening colonoscopy September 2010 Sharlett Iles). Excellent prep, extent of exam to cecum.  ENDOSCOPIC IMPRESSION:  1) Diverticula, scattered in the  sigmoid colon  2) Diminutive polyp in the rectum  3) Otherwise normal examination  R/O ADENOMA.  RECOMMENDATIONS:  1) Follow up colonoscopy in 5 years  2) continue current medications   Impression / Plan:   1. Dffuse ileus, post-op. Some improvement on today's KUB after adding rectal tube and IV reglan yesterday.   Continue Senekot.   Encouraged side lying positions instead of always flat on back. Immobility is working against him.Marland KitchenPhysical activity, or at least up to bedside chair would be helpful.    He isn't taking much in the way of narcotics and that is in his favor.   Keep potassium above 4. Monitor other electrolytes and keep in normal range  Discussed NGT for decompression but he doesn't want one. If recurrent vomiting then will need to press the issue.   2. Hypokalemia, K+, repletion in progress   3. Prostate cancer, s/p radical prostatotomy  4. Diverticulosis. Due for repeat screening colonoscopy in Sept 2015. ``   Thanks   LOS: 4 days   Tye Savoy  09/09/2013, 11:48 AM  GI ATTENDING  History, laboratories, x-rays reviewed. Patient seen and examined. Agree with H&P as above. Recent radical prostatectomy followed by postoperative CVA. Now with postop ileus. Somewhat better after initial measures instituted as outlined. Agree with minimizing or limiting narcotics, correcting potassium, increasing activity as tolerated, continue rectal tube and Reglan. NG tube for recurrent vomiting. Keep n.p.o. Will follow.  Docia Chuck. Geri Seminole., M.D. [facility] Division of Gastroenterology

## 2013-09-09 NOTE — Progress Notes (Signed)
Peripherally Inserted Central Catheter/Midline Placement  The IV Nurse has discussed with the patient and/or persons authorized to consent for the patient, the purpose of this procedure and the potential benefits and risks involved with this procedure.  The benefits include less needle sticks, lab draws from the catheter and patient may be discharged home with the catheter.  Risks include, but not limited to, infection, bleeding, blood clot (thrombus formation), and puncture of an artery; nerve damage and irregular heat beat.  Alternatives to this procedure were also discussed.  PICC/Midline Placement Documentation        Tanner [name] 09/09/2013, 11:08 AM Consent obtained by Claretha Cooper, RN

## 2013-09-09 NOTE — Progress Notes (Signed)
Occupational Therapy Session Note  Patient Details  Name: Tanner [name] MRN: [id number] Date of Birth: 06-07-1956  Today's Date: 09/09/2013 Time: 3361-2244 Time Calculation (min): 30 min  Short Term Goals: Week 1:  OT Short Term Goal 1 (Week 1): Pt. will use RUE to feed self for 75 % of meal OT Short Term Goal 2 (Week 1): Pt. will bath self with min assist OT Short Term Goal 3 (Week 1): Pt. will dress self UB/LB with min assist OT Short Term Goal 4 (Week 1): Pt. will transfer to toilet with min assist  Skilled Therapeutic Interventions/Progress Updates:    1)Pt now with rectal tube in place. Per RN pt awaiting central IV placement to allow RN to give multiple medications including BP medication. Per PA pt okay to participate in bed level therapy but to wait until IV in place for OOB activity. Pt also reporting continued nausea and vomiting with movement (during KUB) and requesting to stay in bed. Pt washed face with use of RUE as gross assist.  Engaged in simple AAROM at Rt shoulder at bed level.  Pt with questions regarding [place] and grip strength reporting having a squeeze ball PTA.  Provided pt with [facility] handout to complete with Rt hand with ball with pt return demonstrating each exercise.  Plan to attempt OOB activity during PM session.  Therapy Documentation Precautions:  Precautions Precautions: Fall Precaution Comments: R sided hemiparesis Restrictions Weight Bearing Restrictions: No General: General Amount of Missed OT Time (min): 30 Minutes Missed Time Reason: Patient ill (comment) Vital Signs: Therapy Vitals Pulse Rate: 96 BP: 144/99 mmHg Oxygen Therapy SpO2: 92 % O2 Device: None (Room air) Pain: Pain Assessment Pain Assessment: No/denies pain Pain Score: 0-No pain  See FIM for current functional status  Therapy/Group: Individual Therapy  Simonne Come 09/09/2013, 10:31 AM

## 2013-09-09 NOTE — Progress Notes (Signed)
Physical Therapy Session Note  Patient Details  Name: Tanner [name] MRN: [id number] Date of Birth: 12/22/1955  Today's Date: 09/09/2013 Time: 4270-6237 and 6283-1517 Time Calculation (min): 34 min and 39 min  Short Term Goals: Week 1:  PT Short Term Goal 1 (Week 1): Patient will be able to perform bed mobility with Mod-Independence. PT Short Term Goal 2 (Week 1): Patient will be able to perform transfers with min-assist. PT Short Term Goal 3 (Week 1): Patient will be able to ambulate with LRAD x 100 feet with min-assist. PT Short Term Goal 4 (Week 1): Patient will be able to ascend/descend 5 steps with Mod-assist.  Skilled Therapeutic Interventions/Progress Updates:   Pt now with rectal tube in place.  Per RN pt awaiting central IV placement to allow RN to give multiple medications including BP medication.  Per PA pt okay to participate in bed level therapy but to wait until IV in place for OOB activity.  Pt also reporting continued nausea and vomiting with movement (during KUB) and requesting to stay in bed.  Discussed with pt home set up, PLOF and activities, assistance available at D/C.  Performed RLE AROM/strengthening at bed level with 15 reps ankle pumps, heel slides, SLR, hip ABD/ADD, hip IR, SAQ, hamstring sets and glute sets.  Pt tolerated well and reports that he would like to try to get OOB this afternoon.  Will f/u this pm.  PM session: pt with PICC in place.  Pt without c/o pain but still reporting some nausea.  PA present and encouraging pt to participate in OOB activity to assist with bowel emptying to avoid having NG tube placed.  Pt agreeable.  Performed rolling to L side with mod A with total verbal cues for sequencing and side > sit max A.  Assessed vitals in sitting secondary to c/o dizziness; BP WFL. Performed sit <> stand from EOB x 2 reps with mod A and maintained standing with min-mod A at EOB to perform lateral weight shifting, RLE hip flexion marches x 8 reps and LLE  advancement forwards and backwards for WB through RLE.  Second stand for RN to check seal on rectal tube secondary to small amount of leakage.  Pt began to c/o increased dizziness in standing; vitals assessed again with drop in BP.  Returned to sitting.  Encouraged pt to sit up for short amount of time this pm in recliner but pt refused secondary to fatigue.  Discussed with pt importance of increasing time OOB and tolerance to upright.  Pt agreeable to sit up in recliner tomorrow between therapies.  Returned to supine with mod A.  Pt repositioned in bed and left with sister present in room.  No episodes of vomiting during session.    Therapy Documentation Precautions:  Precautions Precautions: Fall Precaution Comments: R sided hemiparesis Restrictions Weight Bearing Restrictions: No General: Amount of Missed PT Time (min): 26 Minutes Missed Time Reason: Patient ill (comment) Vital Signs: Therapy Vitals Temp: 99 F (37.2 C) Temp src: Oral Pulse Rate: 96 Resp: 19 BP: 144/99 mmHg Patient Position, if appropriate: Lying Oxygen Therapy SpO2: 92 % O2 Device: None (Room air) Pain: Pain Assessment Pain Assessment: No/denies pain   See FIM for current functional status  Therapy/Group: Individual Therapy  Tanner [name] [facility] 09/09/2013, 8:43 AM

## 2013-09-09 NOTE — Progress Notes (Signed)
CRITICAL VALUE ALERT  Critical value received:  Potassium 2.7  Date of notification:  09/09/13  Time of notification:  0630  Critical value read back:yes  Nurse who received alert:  Arthor Captain LPN  MD notified (1st page):  Dr. Read Drivers  Time of first page:  0630 MD on floor  MD notified (2nd page):  Time of second page:  Responding MD:  Dr. Read Drivers  Time MD responded:  MD on floor

## 2013-09-09 NOTE — Progress Notes (Signed)
Occupational Therapy Note  Patient Details  Name: Tanner [name] MRN: [id number] Date of Birth: [birth year] Today's Date: 09/09/2013  Pt missed 30 mins skilled OT treatment session secondary to fatigue and nausea.  Pt reports being able to participate at a minimal level with PT prior to this session but feeling increased nausea with movement.  Pt reports fatigue setting in from meds and lack of sleep; requesting to rest and start over tomorrow.   Simonne Come 09/09/2013, 2:48 PM

## 2013-09-10 ENCOUNTER — Inpatient Hospital Stay (HOSPITAL_COMMUNITY): Payer: Medicaid Other | Admitting: Occupational Therapy

## 2013-09-10 ENCOUNTER — Inpatient Hospital Stay (HOSPITAL_COMMUNITY): Payer: Medicaid Other | Admitting: Physical Therapy

## 2013-09-10 ENCOUNTER — Inpatient Hospital Stay (HOSPITAL_COMMUNITY): Payer: Medicaid Other

## 2013-09-10 ENCOUNTER — Inpatient Hospital Stay (HOSPITAL_COMMUNITY)
Admission: AD | Admit: 2013-09-10 | Discharge: 2013-09-30 | DRG: 389 | Disposition: A | Discharge status: Home Health | Payer: Medicaid Other | Source: Ambulatory Visit | Attending: Internal Medicine | Admitting: Internal Medicine

## 2013-09-10 ENCOUNTER — Encounter (HOSPITAL_COMMUNITY): Payer: Self-pay | Admitting: Internal Medicine

## 2013-09-10 DIAGNOSIS — D500 Iron deficiency anemia secondary to blood loss (chronic): Secondary | ICD-10-CM | POA: Diagnosis present

## 2013-09-10 DIAGNOSIS — G8110 Spastic hemiplegia affecting unspecified side: Secondary | ICD-10-CM

## 2013-09-10 DIAGNOSIS — I671 Cerebral aneurysm, nonruptured: Secondary | ICD-10-CM | POA: Diagnosis present

## 2013-09-10 DIAGNOSIS — F322 Major depressive disorder, single episode, severe without psychotic features: Secondary | ICD-10-CM

## 2013-09-10 DIAGNOSIS — Z800 Family history of malignant neoplasm of digestive organs: Secondary | ICD-10-CM

## 2013-09-10 DIAGNOSIS — K567 Ileus, unspecified: Secondary | ICD-10-CM | POA: Diagnosis present

## 2013-09-10 DIAGNOSIS — C61 Malignant neoplasm of prostate: Secondary | ICD-10-CM

## 2013-09-10 DIAGNOSIS — Z87891 Personal history of nicotine dependence: Secondary | ICD-10-CM

## 2013-09-10 DIAGNOSIS — D638 Anemia in other chronic diseases classified elsewhere: Secondary | ICD-10-CM | POA: Diagnosis present

## 2013-09-10 DIAGNOSIS — Z8546 Personal history of malignant neoplasm of prostate: Secondary | ICD-10-CM

## 2013-09-10 DIAGNOSIS — K9189 Other postprocedural complications and disorders of digestive system: Secondary | ICD-10-CM

## 2013-09-10 DIAGNOSIS — N390 Urinary tract infection, site not specified: Secondary | ICD-10-CM

## 2013-09-10 DIAGNOSIS — K560 Paralytic ileus: Principal | ICD-10-CM | POA: Diagnosis present

## 2013-09-10 DIAGNOSIS — K7010 Alcoholic hepatitis without ascites: Secondary | ICD-10-CM | POA: Diagnosis present

## 2013-09-10 DIAGNOSIS — I10 Essential (primary) hypertension: Secondary | ICD-10-CM

## 2013-09-10 DIAGNOSIS — R270 Ataxia, unspecified: Secondary | ICD-10-CM

## 2013-09-10 DIAGNOSIS — I6350 Cerebral infarction due to unspecified occlusion or stenosis of unspecified cerebral artery: Secondary | ICD-10-CM

## 2013-09-10 DIAGNOSIS — Z801 Family history of malignant neoplasm of trachea, bronchus and lung: Secondary | ICD-10-CM

## 2013-09-10 DIAGNOSIS — A0472 Enterocolitis due to Clostridium difficile, not specified as recurrent: Secondary | ICD-10-CM

## 2013-09-10 DIAGNOSIS — A498 Other bacterial infections of unspecified site: Secondary | ICD-10-CM | POA: Diagnosis present

## 2013-09-10 DIAGNOSIS — I69959 Hemiplegia and hemiparesis following unspecified cerebrovascular disease affecting unspecified side: Secondary | ICD-10-CM

## 2013-09-10 DIAGNOSIS — R933 Abnormal findings on diagnostic imaging of other parts of digestive tract: Secondary | ICD-10-CM

## 2013-09-10 DIAGNOSIS — S82899A Other fracture of unspecified lower leg, initial encounter for closed fracture: Secondary | ICD-10-CM

## 2013-09-10 DIAGNOSIS — E871 Hypo-osmolality and hyponatremia: Secondary | ICD-10-CM

## 2013-09-10 DIAGNOSIS — Z7982 Long term (current) use of aspirin: Secondary | ICD-10-CM

## 2013-09-10 DIAGNOSIS — K219 Gastro-esophageal reflux disease without esophagitis: Secondary | ICD-10-CM | POA: Diagnosis present

## 2013-09-10 DIAGNOSIS — I639 Cerebral infarction, unspecified: Secondary | ICD-10-CM

## 2013-09-10 DIAGNOSIS — E870 Hyperosmolality and hypernatremia: Secondary | ICD-10-CM | POA: Diagnosis present

## 2013-09-10 DIAGNOSIS — E46 Unspecified protein-calorie malnutrition: Secondary | ICD-10-CM | POA: Diagnosis present

## 2013-09-10 DIAGNOSIS — IMO0002 Reserved for concepts with insufficient information to code with codable children: Secondary | ICD-10-CM

## 2013-09-10 DIAGNOSIS — E876 Hypokalemia: Secondary | ICD-10-CM

## 2013-09-10 DIAGNOSIS — F411 Generalized anxiety disorder: Secondary | ICD-10-CM | POA: Diagnosis present

## 2013-09-10 HISTORY — DX: Transient cerebral ischemic attack, unspecified: G45.9

## 2013-09-10 HISTORY — DX: Ileus, unspecified: K56.7

## 2013-09-10 HISTORY — DX: Unspecified osteoarthritis, unspecified site: M19.90

## 2013-09-10 LAB — CBC
HCT: 28.6 % — ABNORMAL LOW (ref 39.0–52.0)
Hemoglobin: 9.8 g/dL — ABNORMAL LOW (ref 13.0–17.0)
MCH: 32.7 pg (ref 26.0–34.0)
MCHC: 34.3 g/dL (ref 30.0–36.0)
MCV: 95.3 fL (ref 78.0–100.0)
PLATELETS: 308 10*3/uL (ref 150–400)
RBC: 3 MIL/uL — ABNORMAL LOW (ref 4.22–5.81)
RDW: 12.8 % (ref 11.5–15.5)
WBC: 7.8 10*3/uL (ref 4.0–10.5)

## 2013-09-10 LAB — BLOOD GAS, ARTERIAL
Acid-Base Excess: 3.7 mmol/L — ABNORMAL HIGH (ref 0.0–2.0)
BICARBONATE: 26.8 meq/L — AB (ref 20.0–24.0)
DRAWN BY: 40530
FIO2: 21 %
O2 SAT: 94.3 %
PO2 ART: 66.9 mmHg — AB (ref 80.0–100.0)
Patient temperature: 98.6
TCO2: 27.9 mmol/L (ref 0–100)
pCO2 arterial: 34.4 mmHg — ABNORMAL LOW (ref 35.0–45.0)
pH, Arterial: 7.503 — ABNORMAL HIGH (ref 7.350–7.450)

## 2013-09-10 LAB — BASIC METABOLIC PANEL
BUN: 19 mg/dL (ref 6–23)
BUN: 18 mg/dL (ref 6–23)
BUN: 15 mg/dL (ref 6–23)
BUN: 14 mg/dL (ref 6–23)
CHLORIDE: 95 meq/L — AB (ref 96–112)
CHLORIDE: 94 meq/L — AB (ref 96–112)
CHLORIDE: 95 meq/L — AB (ref 96–112)
CO2: 27 meq/L (ref 19–32)
CO2: 27 meq/L (ref 19–32)
CO2: 28 meq/L (ref 19–32)
CO2: 27 mEq/L (ref 19–32)
CREATININE: 0.97 mg/dL (ref 0.50–1.35)
Calcium: 8.4 mg/dL (ref 8.4–10.5)
Calcium: 8.4 mg/dL (ref 8.4–10.5)
Calcium: 8.2 mg/dL — ABNORMAL LOW (ref 8.4–10.5)
Calcium: 8.1 mg/dL — ABNORMAL LOW (ref 8.4–10.5)
Chloride: 94 mEq/L — ABNORMAL LOW (ref 96–112)
Creatinine, Ser: 0.99 mg/dL (ref 0.50–1.35)
Creatinine, Ser: 0.93 mg/dL (ref 0.50–1.35)
Creatinine, Ser: 0.94 mg/dL (ref 0.50–1.35)
GFR calc Af Amer: >90 mL/min (ref >90)
GFR calc Af Amer: >90 mL/min (ref >90)
GFR calc Af Amer: >90 mL/min (ref >90)
GFR calc non Af Amer: >90 mL/min (ref >90)
GFR calc non Af Amer: >90 mL/min (ref >90)
GFR calc non Af Amer: >90 mL/min (ref >90)
GLUCOSE: 111 mg/dL — AB (ref 70–99)
GLUCOSE: 120 mg/dL — AB (ref 70–99)
Glucose, Bld: 139 mg/dL — ABNORMAL HIGH (ref 70–99)
Glucose, Bld: 127 mg/dL — ABNORMAL HIGH (ref 70–99)
POTASSIUM: 2.5 meq/L — AB (ref 3.7–5.3)
POTASSIUM: 2.3 meq/L — AB (ref 3.7–5.3)
POTASSIUM: 2.5 meq/L — AB (ref 3.7–5.3)
Potassium: 2.4 mEq/L — CL (ref 3.7–5.3)
SODIUM: 137 meq/L (ref 137–147)
SODIUM: 136 meq/L — AB (ref 137–147)
SODIUM: 136 meq/L — AB (ref 137–147)
Sodium: 136 mEq/L — ABNORMAL LOW (ref 137–147)

## 2013-09-10 LAB — MAGNESIUM: MAGNESIUM: 2.1 mg/dL (ref 1.5–2.5)

## 2013-09-10 MED ORDER — DEXTROSE 5 % IV SOLN
1.0000 g | INTRAVENOUS | Status: DC
  Administered 2013-09-10: 1 g via INTRAVENOUS
  Filled 2013-09-10 (×2): qty 10

## 2013-09-10 MED ORDER — METOCLOPRAMIDE HCL 5 MG/ML IJ SOLN
5.0000 mg | Freq: Four times a day (QID) | INTRAMUSCULAR | Status: AC
  Administered 2013-09-10 – 2013-09-11 (×2): 5 mg via INTRAVENOUS
  Filled 2013-09-10 (×2): qty 1

## 2013-09-10 MED ORDER — ASPIRIN 325 MG PO TABS
325.0000 mg | ORAL_TABLET | Freq: Every day | ORAL | Status: DC
  Administered 2013-09-12 – 2013-09-16 (×5): 325 mg via ORAL
  Filled 2013-09-10 (×7): qty 1

## 2013-09-10 MED ORDER — POTASSIUM CHLORIDE 10 MEQ/100ML IV SOLN
10.0000 meq | INTRAVENOUS | Status: AC
  Administered 2013-09-10 – 2013-09-11 (×6): 10 meq via INTRAVENOUS
  Filled 2013-09-10 (×6): qty 100

## 2013-09-10 MED ORDER — ACETAMINOPHEN 650 MG RE SUPP
650.0000 mg | Freq: Four times a day (QID) | RECTAL | Status: DC | PRN
Start: 2013-09-10 — End: 2013-09-19

## 2013-09-10 MED ORDER — ENOXAPARIN SODIUM 40 MG/0.4ML ~~LOC~~ SOLN
40.0000 mg | Freq: Every day | SUBCUTANEOUS | Status: DC
  Administered 2013-09-10 – 2013-09-29 (×20): 40 mg via SUBCUTANEOUS
  Filled 2013-09-10 (×21): qty 0.4

## 2013-09-10 MED ORDER — KCL IN DEXTROSE-NACL 30-5-0.45 MEQ/L-%-% IV SOLN
INTRAVENOUS | Status: DC
  Administered 2013-09-10: 14:00:00 via INTRAVENOUS
  Filled 2013-09-10 (×2): qty 1000

## 2013-09-10 MED ORDER — POTASSIUM CHLORIDE 10 MEQ/100ML IV SOLN
10.0000 meq | INTRAVENOUS | Status: DC
  Administered 2013-09-10: 10 meq via INTRAVENOUS
  Filled 2013-09-10 (×3): qty 100

## 2013-09-10 MED ORDER — METOPROLOL TARTRATE 1 MG/ML IV SOLN
2.5000 mg | Freq: Four times a day (QID) | INTRAVENOUS | Status: DC
  Administered 2013-09-11 – 2013-09-12 (×7): 2.5 mg via INTRAVENOUS
  Filled 2013-09-10 (×12): qty 5

## 2013-09-10 MED ORDER — ACETAMINOPHEN 325 MG PO TABS
650.0000 mg | ORAL_TABLET | Freq: Four times a day (QID) | ORAL | Status: DC | PRN
  Administered 2013-09-14 – 2013-09-30 (×29): 650 mg via ORAL
  Filled 2013-09-10 (×31): qty 2

## 2013-09-10 MED ORDER — ASPIRIN 300 MG RE SUPP
300.0000 mg | Freq: Every day | RECTAL | Status: DC
  Filled 2013-09-10: qty 1

## 2013-09-10 MED ORDER — ONDANSETRON HCL 4 MG/2ML IJ SOLN
4.0000 mg | Freq: Four times a day (QID) | INTRAMUSCULAR | Status: DC | PRN
  Administered 2013-09-13 – 2013-09-21 (×13): 4 mg via INTRAVENOUS
  Filled 2013-09-10 (×15): qty 2

## 2013-09-10 MED ORDER — POTASSIUM CHLORIDE 10 MEQ/100ML IV SOLN
10.0000 meq | INTRAVENOUS | Status: AC
  Administered 2013-09-10 (×3): 10 meq via INTRAVENOUS
  Filled 2013-09-10 (×3): qty 100

## 2013-09-10 MED ORDER — HYDRALAZINE HCL 20 MG/ML IJ SOLN
10.0000 mg | INTRAMUSCULAR | Status: DC | PRN
  Administered 2013-09-12: 21:00:00 via INTRAVENOUS
  Administered 2013-09-14 – 2013-09-15 (×2): 10 mg via INTRAVENOUS
  Filled 2013-09-10 (×3): qty 1

## 2013-09-10 MED ORDER — ONDANSETRON HCL 4 MG PO TABS
4.0000 mg | ORAL_TABLET | Freq: Four times a day (QID) | ORAL | Status: DC | PRN
  Administered 2013-09-22 – 2013-09-29 (×2): 4 mg via ORAL
  Filled 2013-09-10 (×3): qty 1

## 2013-09-10 MED ORDER — POTASSIUM CHLORIDE IN NACL 40-0.9 MEQ/L-% IV SOLN
INTRAVENOUS | Status: AC
  Administered 2013-09-10 – 2013-09-11 (×2): via INTRAVENOUS
  Filled 2013-09-10 (×3): qty 1000

## 2013-09-10 NOTE — Progress Notes (Signed)
Occupational Therapy Session Note  Patient Details  Name: Tanner [name] MRN: [id number] Date of Birth: 1955-07-21  Today's Date: 09/10/2013 Time: 9678-9381 Time Calculation (min): 25 min  Short Term Goals: Week 1:  OT Short Term Goal 1 (Week 1): Pt. will use RUE to feed self for 75 % of meal OT Short Term Goal 2 (Week 1): Pt. will bath self with min assist OT Short Term Goal 3 (Week 1): Pt. will dress self UB/LB with min assist OT Short Term Goal 4 (Week 1): Pt. will transfer to toilet with min assist  Skilled Therapeutic Interventions/Progress Updates:    1) Pt seen for ADL retraining with focus on use of RUE during grooming tasks and repositioning.  Pt having NG tube placed, missing 35 mins.  Upon arrival, pt reports only wanting to complete grooming tasks due to no clothes and feeling nauseous.  Pt with occasional burps and gags throughout session with 2 episodes of dry heaving only producing thick saliva.  Pt with decreased spontaneous use of RUE, requiring verbal cues to integrate.  Pt reports uncomfortable in recliner, completed multiple squats to position pillow under buttocks with pt requiring lifting assist.  Encouraged pt to sit up in recliner until noon if possible.  Therapy Documentation Precautions:  Precautions Precautions: Fall Precaution Comments: Foley, rectal tube and IV; monitor vitals, air mattress overlay SR x 4 Restrictions Weight Bearing Restrictions: No General: General Amount of Missed OT Time (min): 35 Minutes Vital Signs: Therapy Vitals Pulse Rate: 107 BP: 123/84 mmHg Patient Position, if appropriate: Standing (before BP meds given) Pain: Pain Assessment Pain Assessment: No/denies pain Pain Score: 0-No pain  See FIM for current functional status  Therapy/Group: Individual Therapy  Simonne Come 09/10/2013, 11:29 AM

## 2013-09-10 NOTE — Progress Notes (Signed)
CRITICAL VALUE ALERT  Critical value received:  Potassium 2.4  Date of notification:  09/10/13  Time of notification:  0750  Critical value read back:yes  Nurse who received alert:  Rayetta Pigg, RN  MD notified (1st page):  Dr. Letta Pate  Time of first page:  [phone]  MD notified (2nd page):  Time of second page:  Responding MD:  Dr. Letta Pate  Time MD responded:  [phone]  Ordered additional potassium runs IV for patient as well as adding potassium to IVF.

## 2013-09-10 NOTE — Progress Notes (Signed)
CRITICAL VALUE ALERT  Critical value received:  Potassium 2.3  Date of notification:  09/10/13  Time of notification:  1900  Critical value read back:yes  Nurse who received alert:  Rayetta Pigg, RN  MD notified (1st page):  Algis Liming, PA  Time of first page:  95  MD notified (2nd page):  Time of second page:  Responding MD:  Algis Liming, PA  Time MD responded:  1900

## 2013-09-10 NOTE — Progress Notes (Signed)
Subjective/Complaints: 58 y.o. right-handed male with history of hypertension as well as alcoholic hepatitis. Admitted 08/29/2012 with workup for recent prostate cancer followed by urology services and underwent robotic-assisted laparoscopic radical prostatectomy 08/29/2013 per Dr. Jasmine December. Presented with new onset of right side weakness. MRI of the brain   showed acute nonhemorrhagic infarct extending from posterior left lenticular nucleus to posterior left corona radiata. Incidental finding of right ICA aneurysm with planned workup as outpatient. Echocardiogram with ejection fraction of 65% and no wall motion abnormalities. Carotid Dopplers with no ICA stenosis  Still with nausea , no vomiting this am Appreciate GI note Also occ hiccups Has rectal tube  Review of Systems - Negative except abd pain, nausea, vomiting Objective: Vital Signs: Blood pressure 153/92, pulse 100, temperature 98.9 F (37.2 C), temperature source Oral, resp. rate 18, height 5' 10"  (1.778 m), weight 85.2 kg (187 lb 13.3 oz), SpO2 97.00%. Dg Abd 1 View  09/09/2013   CLINICAL DATA:  History of vomiting and and ileus  EXAM: ABDOMEN - 1 VIEW  COMPARISON:  DG ABD 1 VIEW dated 09/08/2013  FINDINGS: Multiple dilated loops of large and small bowel are again appreciated. There has been interval partial decompression of the cecum now measuring 10.4 cm in diameter. Otherwise no further change in the abdomen findings. Skin staples are identified within the lower portions of the abdomen. There is mild levoscoliosis of the lumbar spine likely positional. Degenerative changes appreciated within the lower lumbar spine.  IMPRESSION: Findings again consistent with an adynamic ileus. There is been a component of partial decompression of the cecum. Continued surveillance evaluation recommended.   Electronically Signed   By: Margaree Mackintosh M.D.   On: 09/09/2013 08:04   Dg Abd 1 View  09/08/2013   CLINICAL DATA:  Abdominal distention,  nausea/vomiting  EXAM: ABDOMEN - 1 VIEW  COMPARISON:  09/07/2013  FINDINGS: Diffusely dilated loops of small and large bowel, favored to reflect adynamic ileus, unchanged.  Cecum measures 13.0 cm, previously 13.4 cm, grossly unchanged.  Skin staples overlying the lower abdomen.  Degenerative changes of the lumbar spine.  IMPRESSION: Diffusely dilated loops of small and large bowel, favored to reflect adynamic ileus, unchanged.   Electronically Signed   By: Julian Hy M.D.   On: 09/08/2013 13:42   Results for orders placed during the hospital encounter of 09/05/13 (from the past 72 hour(s))  BASIC METABOLIC PANEL     Status: Abnormal   Collection Time    09/07/13  7:02 AM      Result Value Ref Range   Sodium 130 (*) 137 - 147 mEq/L   Potassium 3.0 (*) 3.7 - 5.3 mEq/L   Chloride 85 (*) 96 - 112 mEq/L   CO2 24  19 - 32 mEq/L   Glucose, Bld 114 (*) 70 - 99 mg/dL   BUN 23  6 - 23 mg/dL   Creatinine, Ser 1.52 (*) 0.50 - 1.35 mg/dL   Calcium 8.3 (*) 8.4 - 10.5 mg/dL   GFR calc non Af Amer 50 (*) >90 mL/min   GFR calc Af Amer 58 (*) >90 mL/min   Comment: (NOTE)     The eGFR has been calculated using the CKD EPI equation.     This calculation has not been validated in all clinical situations.     eGFR's persistently <90 mL/min signify possible Chronic Kidney     Disease.  CBC WITH DIFFERENTIAL     Status: Abnormal   Collection Time    09/08/13  6:45 AM      Result Value Ref Range   WBC 8.3  4.0 - 10.5 K/uL   RBC 3.35 (*) 4.22 - 5.81 MIL/uL   Hemoglobin 11.3 (*) 13.0 - 17.0 g/dL   HCT 31.4 (*) 39.0 - 52.0 %   MCV 93.7  78.0 - 100.0 fL   MCH 33.7  26.0 - 34.0 pg   MCHC 36.0  30.0 - 36.0 g/dL   RDW 12.5  11.5 - 15.5 %   Platelets 330  150 - 400 K/uL   Comment: REPEATED TO VERIFY     SPECIMEN CHECKED FOR CLOTS   Neutrophils Relative % 81 (*) 43 - 77 %   Neutro Abs 6.7  1.7 - 7.7 K/uL   Lymphocytes Relative 5 (*) 12 - 46 %   Lymphs Abs 0.4 (*) 0.7 - 4.0 K/uL   Monocytes Relative 14  (*) 3 - 12 %   Monocytes Absolute 1.1 (*) 0.1 - 1.0 K/uL   Eosinophils Relative 0  0 - 5 %   Eosinophils Absolute 0.0  0.0 - 0.7 K/uL   Basophils Relative 0  0 - 1 %   Basophils Absolute 0.0  0.0 - 0.1 K/uL  COMPREHENSIVE METABOLIC PANEL     Status: Abnormal   Collection Time    09/08/13  6:45 AM      Result Value Ref Range   Sodium 131 (*) 137 - 147 mEq/L   Potassium 2.8 (*) 3.7 - 5.3 mEq/L   Comment: CRITICAL RESULT CALLED TO, READ BACK BY AND VERIFIED WITH:     Janalyn Harder RN 09/08/13 0819 COSTELLO B   Chloride 87 (*) 96 - 112 mEq/L   CO2 25  19 - 32 mEq/L   Glucose, Bld 119 (*) 70 - 99 mg/dL   BUN 20  6 - 23 mg/dL   Creatinine, Ser 1.13  0.50 - 1.35 mg/dL   Calcium 8.7  8.4 - 10.5 mg/dL   Total Protein 6.1  6.0 - 8.3 g/dL   Albumin 2.1 (*) 3.5 - 5.2 g/dL   AST 11  0 - 37 U/L   ALT 8  0 - 53 U/L   Alkaline Phosphatase 48  39 - 117 U/L   Total Bilirubin 0.5  0.3 - 1.2 mg/dL   GFR calc non Af Amer 71 (*) >90 mL/min   GFR calc Af Amer 83 (*) >90 mL/min   Comment: (NOTE)     The eGFR has been calculated using the CKD EPI equation.     This calculation has not been validated in all clinical situations.     eGFR's persistently <90 mL/min signify possible Chronic Kidney     Disease.  BASIC METABOLIC PANEL     Status: Abnormal   Collection Time    09/08/13 11:38 AM      Result Value Ref Range   Sodium 134 (*) 137 - 147 mEq/L   Potassium 3.5 (*) 3.7 - 5.3 mEq/L   Chloride 89 (*) 96 - 112 mEq/L   CO2 26  19 - 32 mEq/L   Glucose, Bld 115 (*) 70 - 99 mg/dL   BUN 20  6 - 23 mg/dL   Creatinine, Ser 1.24  0.50 - 1.35 mg/dL   Calcium 8.9  8.4 - 10.5 mg/dL   GFR calc non Af Amer 64 (*) >90 mL/min   GFR calc Af Amer 74 (*) >90 mL/min   Comment: (NOTE)     The eGFR has been calculated  using the CKD EPI equation.     This calculation has not been validated in all clinical situations.     eGFR's persistently <90 mL/min signify possible Chronic Kidney     Disease.  BASIC METABOLIC PANEL      Status: Abnormal   Collection Time    09/09/13  5:00 AM      Result Value Ref Range   Sodium 136 (*) 137 - 147 mEq/L   Potassium 2.7 (*) 3.7 - 5.3 mEq/L   Comment: CRITICAL RESULT CALLED TO, READ BACK BY AND VERIFIED WITH:     BROWN,V RN 09/09/2013 0626 JORDANS     REPEATED TO VERIFY   Chloride 91 (*) 96 - 112 mEq/L   CO2 25  19 - 32 mEq/L   Glucose, Bld 128 (*) 70 - 99 mg/dL   BUN 22  6 - 23 mg/dL   Creatinine, Ser 1.28  0.50 - 1.35 mg/dL   Calcium 8.7  8.4 - 10.5 mg/dL   GFR calc non Af Amer 61 (*) >90 mL/min   GFR calc Af Amer 71 (*) >90 mL/min   Comment: (NOTE)     The eGFR has been calculated using the CKD EPI equation.     This calculation has not been validated in all clinical situations.     eGFR's persistently <90 mL/min signify possible Chronic Kidney     Disease.  CBC WITH DIFFERENTIAL     Status: Abnormal   Collection Time    09/09/13  6:49 AM      Result Value Ref Range   WBC 9.4  4.0 - 10.5 K/uL   RBC 3.22 (*) 4.22 - 5.81 MIL/uL   Hemoglobin 10.7 (*) 13.0 - 17.0 g/dL   HCT 30.4 (*) 39.0 - 52.0 %   MCV 94.4  78.0 - 100.0 fL   MCH 33.2  26.0 - 34.0 pg   MCHC 35.2  30.0 - 36.0 g/dL   RDW 12.5  11.5 - 15.5 %   Platelets 361  150 - 400 K/uL   Neutrophils Relative % 82 (*) 43 - 77 %   Neutro Abs 7.7  1.7 - 7.7 K/uL   Lymphocytes Relative 13  12 - 46 %   Lymphs Abs 1.2  0.7 - 4.0 K/uL   Monocytes Relative 5  3 - 12 %   Monocytes Absolute 0.4  0.1 - 1.0 K/uL   Eosinophils Relative 0  0 - 5 %   Eosinophils Absolute 0.0  0.0 - 0.7 K/uL   Basophils Relative 0  0 - 1 %   Basophils Absolute 0.0  0.0 - 0.1 K/uL     HEENT: normal Cardio: RRR Resp: CTA B/L GI: BS negative, Tender and Distention Extremity:  No Edema Skin:   Wound C/D/I Neuro: Flat, Cranial Nerve II-XII normal and Abnormal Motor Right side 3-/5 Left side 5/5 Musc/Skel:  Normal Gen nauseated   Assessment/Plan: 1. Functional deficits secondary to Left corona radiata thrombotic infarct which  require 3+ hours per day of interdisciplinary therapy in a comprehensive inpatient rehab setting. Physiatrist is providing close team supervision and 24 hour management of active medical problems listed below. Physiatrist and rehab team continue to assess barriers to discharge/monitor patient progress toward functional and medical goals. Team conference today please see physician documentation under team conference tab, met with team face-to-face to discuss problems,progress, and goals. Formulized individual treatment plan based on medical history, underlying problem and comorbidities. FIM: FIM - Bathing Bathing Steps Patient Completed:  Chest;Right Arm;Abdomen;Right upper leg;Left upper leg Bathing: 3: Mod-Patient completes 5-7 64f10 parts or 50-74%  FIM - Upper Body Dressing/Undressing Upper body dressing/undressing: 0: Wears gown/pajamas-no public clothing FIM - Lower Body Dressing/Undressing Lower body dressing/undressing: 0: Wears gInterior and spatial designer    FIM - TAir cabin crewTransfers: 0-Activity did not occur  FIM - BControl and instrumentation engineerDevices: Arm rests Bed/Chair Transfer: 2: Supine > Sit: Max A (lifting assist/Pt. 25-49%);3: Sit > Supine: Mod A (lifting assist/Pt. 50-74%/lift 2 legs);3: Chair or W/C > Bed: Mod A (lift or lower assist);3: Bed > Chair or W/C: Mod A (lift or lower assist)  FIM - Locomotion: Wheelchair Locomotion: Wheelchair: 0: Activity did not occur FIM - Locomotion: Ambulation Locomotion: Ambulation: 0: Activity did not occur  Comprehension Comprehension Mode: Auditory Comprehension: 5-Understands basic 90% of the time/requires cueing < 10% of the time  Expression Expression Mode: Verbal Expression: 5-Expresses complex 90% of the time/cues < 10% of the time  Social Interaction Social Interaction: 5-Interacts appropriately 90% of the time - Needs monitoring or encouragement for participation or  interaction.  Problem Solving Problem Solving: 4-Solves basic 75 - 89% of the time/requires cueing 10 - 24% of the time  Memory Memory: 5-Recognizes or recalls 90% of the time/requires cueing < 10% of the time  Medical Problem List and Plan:  1. 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 DVT Prophylaxis/Anticoagulation: Subcutaneous heparin. Monitor platelet counts any signs of bleeding  3. Pain Management: MSIR 15 mg eve severe painry 4 hours as needed, change to tramadol  To reduce ileus 4. Mood: Klonopin 0.5 mg daily. Provide emotional support  5. Neuropsych: This patient is capable of making decisions on his own behalf.  6. Robotic radical prostatectomy 08/29/2013 per Dr. WJasmine Decemberurology services. Keep Foley catheter tube x10 days. JP drain removed 09/05/2013, no drainage noted  7. Hypertension. Cardura 8 mg daily, Lasix 20 mg daily, lisinopril 40 mg daily, Lopressor 50 mg twice a day. Monitor with increased mobility  8. Incidental findings of right ICA aneurysm. Plan followup outpatient with repeat imaging in one year  9.  Post op ileus, KUB, clear liq, IVF, mobilize  LOS (Days) 5 A FACE TO FACE EVALUATION WAS PERFORMED  KIRSTEINS,ANDREW E 09/10/2013, 6:55 AM

## 2013-09-10 NOTE — Progress Notes (Signed)
Social Work Patient ID: Tanner [name], male   DOB: [birth year], 58 y.o.   MRN: [id number] Met with pt and sister to inform of team conference goals-supervision/mod/i level and discharge 3/20.  He hopes to feel better later today. His sister will be assisting with his care at discharge.  Aware if does better can move up discharge.  Has been limited with participation in therapies By medical issues.  Continue to work on discharge plans and provide support.

## 2013-09-10 NOTE — H&P (Addendum)
[facility] History and Physical  Tanner [name] YDX:[id number] DOB: [birth year] DOA: 09/10/2013  Referring physician: PA at rehabilitation. PCP: No PCP Per Patient  Specialists: Dr. Jasmine December. Urologist.  Chief Complaint: Ileus and hypokalemia.  HPI: Tanner [name] is a 58 y.o. male with history of prostate cancer and hypertension who has had recent prostatectomy following which patient had developed right-sided hemiplegia and was found to have acute CVA and was eventually transferred to rehabilitation where patient started developing postoperative ileus. Gastroenterologist was consulted and patient's narcotics were stopped and patient was placed on NG tube and rectal tube. Patient also was started on IV Reglan and was also found to have hypokalemia. Patient also was found to be febrile and urine cultures Escherichia coli. Since patient's status did not improve and needed further acute care patient was transferred to [facility] cone from the rehabilitation from fourth floor. Patient on my exam denies any chest pain shortness of breath. Has distended abdomen. Has no abdominal pain at this time. Denies any fever chills. Is alert awake oriented.   Review of Systems: As presented in the history of presenting illness, rest negative.  Past Medical History  Diagnosis Date  . Alcoholic hepatitis   . Hypertension   . 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 Shortness of breath     new onset- occasionally-at rest and exertion  . GERD (gastroesophageal reflux disease)   . Anxiety    Past Surgical History  Procedure Laterality Date  . Hand surgery Right   . Hernia repair    . Orif ankle fracture Right 12/10/2012    Procedure: OPEN REDUCTION INTERNAL FIXATION (ORIF) ANKLE FRACTURE;  Surgeon: Hessie Dibble, MD;  Location: [facility];  Service: Orthopedics;  Laterality: Right;  . Robot assisted laparoscopic radical prostatectomy N/A 08/29/2013    Procedure: ROBOTIC ASSISTED LAPAROSCOPIC RADICAL  PROSTATECTOMY LEVEL 2, Lysis of adhesions;  Surgeon: Molli Hazard, MD;  Location: [facility];  Service: Urology;  Laterality: N/A;  . Lymphadenectomy Bilateral 08/29/2013    Procedure: LYMPHADENECTOMY "BILATERAL PELVIC LYMPH NODE DISSECTION";  Surgeon: Molli Hazard, MD;  Location: [facility];  Service: Urology;  Laterality: Bilateral;   Social History:  reports that he quit smoking about 8 months ago. He has never used smokeless tobacco. He reports that he drinks alcohol. He reports that he uses illicit drugs. Where does patient live presently in rehabilitation. Can patient participate in ADLs? Not sure.  Allergies  Allergen Reactions  . Dilaudid [Hydromorphone Hcl] Nausea And Vomiting    Pre pt history  . Oxycodone Nausea And Vomiting  . Procaine Hcl     Patient states he is allergic to novicaine  . Sulfa Antibiotics Rash    Family History:  Family History  Problem Relation Age of Onset  . Pancreatic cancer Sister   . Lung cancer Father       Prior to Admission medications   Medication Sig Start Date End Date Taking? Authorizing Provider  aspirin EC 81 MG EC tablet Take 1 tablet (81 mg total) by mouth daily. 09/05/13   Molli Hazard, MD  ciprofloxacin (CIPRO) 500 MG tablet Take 1 tablet (500 mg total) by mouth 2 (two) times daily. Begin the day before you return to have your catheter removed. 08/29/13   Molli Hazard, MD  clonazePAM (KLONOPIN) 0.5 MG tablet Take 0.5 mg by mouth 3 (three) times daily.     Historical Provider, MD  furosemide (LASIX) 40 MG tablet Take 20 mg by  mouth every morning.     Historical Provider, MD  hyoscyamine (LEVSIN, ANASPAZ) 0.125 MG tablet Take 1 tablet (0.125 mg total) by mouth every 4 (four) hours as needed (bladder spasms). 08/29/13   Molli Hazard, MD  lisinopril (PRINIVIL,ZESTRIL) 40 MG tablet Take 40 mg by mouth every morning.     Historical Provider, MD  metoprolol (LOPRESSOR) 50 MG tablet Take 50 mg by mouth 2 (two)  times daily.    Historical Provider, MD  morphine (MSIR) 15 MG tablet Take 1 tablet (15 mg total) by mouth every 4 (four) hours as needed for moderate pain or severe pain. 09/02/13   Molli Hazard, MD  omeprazole (PRILOSEC) 40 MG capsule Take 40 mg by mouth 2 (two) times daily.     Historical Provider, MD  ondansetron (ZOFRAN) 4 MG tablet Take 1 tablet (4 mg total) by mouth every 4 (four) hours as needed for nausea or vomiting. 09/02/13   Molli Hazard, MD  oxybutynin (DITROPAN) 5 MG tablet Take 1 tablet (5 mg total) by mouth every 6 (six) hours as needed for bladder spasms. 08/29/13   Molli Hazard, MD  senna-docusate (SENOKOT S) 8.6-50 MG per tablet Take 1 tablet by mouth 2 (two) times daily. 08/29/13   Molli Hazard, MD    Physical Exam: Filed Vitals:   09/10/13 2113  BP: 144/88  Pulse: 110  Temp: 99.6 F (37.6 C)  TempSrc: Oral  Resp: 20  Height: 5\' 10"  (1.778 m)  Weight: 71 kg (156 lb 8.4 oz)  SpO2: 93%     General:  Well-developed and nourished.  Eyes: Anicteric no pallor.  ENT: No discharge from the ears eyes nose mouth.  Neck: No mass felt.  Cardiovascular: S1-S2 heard.  Respiratory: No rhonchi or crepitations.  Abdomen: Distended abdomen. Sutures and scars from recent surgery. Bowel sounds not present.  Skin: Sutures and scars from recent surgery.  Musculoskeletal: No edema.  Psychiatric: Appears normal.  Neurologic: Alert awake oriented to time place and person. Right-sided weakness.  Labs on Admission:  Basic Metabolic Panel:  Recent Labs Lab 09/08/13 1138 09/09/13 0500 09/10/13 0625 09/10/13 1238 09/10/13 1800  NA 134* 136* 136* 137 136*  K 3.5* 2.7* 2.4* 2.5* 2.3*  CL 89* 91* 94* 95* 94*  CO2 26 25 27 27 28   GLUCOSE 115* 128* 139* 127* 111*  BUN 20 22 19 18 15   CREATININE 1.24 1.28 0.97 0.99 0.93  CALCIUM 8.9 8.7 8.4 8.4 8.2*  MG  --   --  2.1  --   --    Liver Function Tests:  Recent Labs Lab 09/06/13 1040  09/08/13 0645  AST 15 11  ALT 9 8  ALKPHOS 52 48  BILITOT 0.6 0.5  PROT 6.3 6.1  ALBUMIN 2.4* 2.1*    Recent Labs Lab 09/06/13 1040  LIPASE 14   No results found for this basename: AMMONIA,  in the last 168 hours CBC:  Recent Labs Lab 09/05/13 1700 09/06/13 1040 09/08/13 0645 09/09/13 0649  WBC 7.5 11.1* 8.3 9.4  NEUTROABS  --   --  6.7 7.7  HGB 10.9* 11.1* 11.3* 10.7*  HCT 31.1* 31.9* 31.4* 30.4*  MCV 96.3 96.1 93.7 94.4  PLT 185 235 330 361   Cardiac Enzymes: No results found for this basename: CKTOTAL, CKMB, CKMBINDEX, TROPONINI,  in the last 168 hours  BNP (last 3 results) No results found for this basename: PROBNP,  in the last 8760 hours CBG: No results  found for this basename: GLUCAP,  in the last 168 hours  Radiological Exams on Admission: Dg Abd 1 View  09/09/2013   CLINICAL DATA:  History of vomiting and and ileus  EXAM: ABDOMEN - 1 VIEW  COMPARISON:  DG ABD 1 VIEW dated 09/08/2013  FINDINGS: Multiple dilated loops of large and small bowel are again appreciated. There has been interval partial decompression of the cecum now measuring 10.4 cm in diameter. Otherwise no further change in the abdomen findings. Skin staples are identified within the lower portions of the abdomen. There is mild levoscoliosis of the lumbar spine likely positional. Degenerative changes appreciated within the lower lumbar spine.  IMPRESSION: Findings again consistent with an adynamic ileus. There is been a component of partial decompression of the cecum. Continued surveillance evaluation recommended.   Electronically Signed   By: Margaree Mackintosh M.D.   On: 09/09/2013 08:04    Assessment/Plan Principal Problem:   Ileus Active Problems:   Malignant neoplasm of prostate   CVA (cerebral infarction)   Hypokalemia   1. Ileus - patient does have significant hypokalemia probably contributing to ileus. At this time we will aggressively replace potassium through IV. Check magnesium levels.  Hydrate patient. Continue patient with NG tube suction and rectal tube. We will get a CT abdomen and pelvis. I have also ordered 2 more doses of IV Reglan. 2. Severe hypokalemia - see #1. 3. E. coli UTI sensitive to ceftriaxone - continue ceftriaxone. 4. Anemia probably from blood loss - follow CBC closely. 5. Hypertension - I have placed patient n.p.o. for now and on when necessary IV hydralazine for systolic blood pressure more than 160. I have placed patient schedule metoprolol 2.5 mg IV every 6 hourly. 6. Recent stroke with right-sided hemiplegia - continue aspirin. 7. Recent surgery for prostate cancer.  I have ordered repeat abdominal x-ray and also chest x-ray because patient had some cough. Since patient has loose stools check stool for C. difficile.  I have reviewed patient's old charts and labs.  Code Status: Full code.  Family Communication: Family at the bedside.  Disposition Plan: Admit to inpatient.    Kalden Wanke N. [facility] Pager [phone].  If 7PM-7AM, please contact night-coverage [email or web address] Password [facility] 09/10/2013, 9:17 PM

## 2013-09-10 NOTE — Progress Notes (Signed)
CRITICAL VALUE ALERT  Critical value received:  Potassium 2.5  Date of notification:  09/10/13  Time of notification:  2340  Critical value read back: yes  Nurse who received alert:  Quentin Angst  MD notified (1st page):  Hal Hope  Time of first page:  2342  MD notified (2nd page): Schorr   Time of second page: 2352  Responding MD:  Schorr   Time MD responded:  0008  No new orders received.

## 2013-09-10 NOTE — Progress Notes (Signed)
Ordered to insert NG tube.  Inserted 59F NG tube successfully on the second attempt.  Patient tolerated procedure very well.  400 ml of dark green fluid returned.  Placed patient on low intermittent suction per MD order.  Brita Romp, RN

## 2013-09-10 NOTE — Progress Notes (Signed)
CRITICAL VALUE ALERT  Critical value received:  Potassium 2.5  Date of notification:  09/10/13  Time of notification:  3151  Critical value read back:yes  Nurse who received alert:  Rayetta Pigg, RN  MD notified (1st page):  Marlowe Shores, PA  Time of first page:  1340  MD notified (2nd page):  Time of second page:  Responding MD:  Marlowe Shores, Heeia  Time MD responded:  1340

## 2013-09-10 NOTE — Progress Notes (Signed)
Patient appears to be more lethargic, fatigued.  Refused to work with therapy today.  Notified Marlowe Shores, PA of change in mental status.  No new orders written at this time, encouraged just to watch patient closely.  Will continue to monitor. Brita Romp, RN

## 2013-09-10 NOTE — Progress Notes (Signed)
    Progress Note   Subjective  vomited twice this am   Objective   Vital signs in last 24 hours: Temp:  [98.9 F (37.2 C)] 98.9 F (37.2 C) (03/04 0600) Pulse Rate:  [93-112] 100 (03/04 0600) Resp:  [18-19] 18 (03/04 0600) BP: (107-153)/(79-92) 153/92 mmHg (03/04 0600) SpO2:  [91 %-97 %] 97 % (03/04 0600) Last BM Date: 09/10/13 General:    white male in NAD Heart:  Regular rate and rhythm Abdomen:  Soft, nontender, moderately distended. Hypoactive bowel sounds. Extremities:  Without edema. Neurologic:  Alert and oriented,  grossly normal neurologically. Psych:  Cooperative. Normal mood and affect.  Lab Results:  Recent Labs  09/08/13 0645 09/09/13 0649  WBC 8.3 9.4  HGB 11.3* 10.7*  HCT 31.4* 30.4*  PLT 330 361   BMET  Recent Labs  09/08/13 1138 09/09/13 0500 09/10/13 0625  NA 134* 136* 136*  K 3.5* 2.7* 2.4*  CL 89* 91* 94*  CO2 26 25 27   GLUCOSE 115* 128* 139*  BUN 20 22 19   CREATININE 1.24 1.28 0.97  CALCIUM 8.9 8.7 8.4   LFT  Recent Labs  09/08/13 0645  PROT 6.1  ALBUMIN 2.1*  AST 11  ALT 8  ALKPHOS 48  BILITOT 0.5   Studies/Results: Dg Abd 1 View  09/09/2013   CLINICAL DATA:  History of vomiting and and ileus  EXAM: ABDOMEN - 1 VIEW  COMPARISON:  DG ABD 1 VIEW dated 09/08/2013  FINDINGS: Multiple dilated loops of large and small bowel are again appreciated. There has been interval partial decompression of the cecum now measuring 10.4 cm in diameter. Otherwise no further change in the abdomen findings. Skin staples are identified within the lower portions of the abdomen. There is mild levoscoliosis of the lumbar spine likely positional. Degenerative changes appreciated within the lower lumbar spine.  IMPRESSION: Findings again consistent with an adynamic ileus. There is been a component of partial decompression of the cecum. Continued surveillance evaluation recommended.   Electronically Signed   By: Margaree Mackintosh M.D.   On: 09/09/2013 08:04     Assessment / Plan:   1. Post-op ileus. Vomiting this am. Abdominal exam unchanged from yesterday. He did have nearly 1000cc stool output yesterday according to RN.  Will place NGT to intermittent suction. Recheck am KUB. Continue Reglan, mobilization as tolerated and keep electrolytes in normal range. He isn't really taking narcotics.   2. Hypokalemia, K+ down to 2.4 today. Getting K+ runs and K+ is in IVF as well. Will check Mg+ level.   3. Prostate cancer, s/p radical prostatectomy  4. Diverticulosis. Due for repeat screening colonoscopy Sept 2015.     LOS: 5 days   Tye Savoy  09/10/2013, 9:43 AM   GI ATTENDING  Interval history and laboratories reviewed. No followup x-rays. Patient seen and examined. Agree with H&P as above. Worsening symptoms overnight with nausea and vomiting. Felt better with NG tube. Still distended with obvious ileus. Hypokalemia remains persistent and significant problem. Continue with NG tube, rectal tube, correction of hypokalemia. Agree with checking magnesium. Increase activity as much as possible. Repeat x-rays in a.m. Discussed with physiatry service.Will follow  Docia Chuck. Geri Seminole., M.D. [facility] Division of Gastroenterology

## 2013-09-10 NOTE — Discharge Summary (Signed)
Physician Discharge Summary  Patient ID: Tanner [name] MRN: [id number] DOB/AGE: 07-16-1955 58 y.o.  Admit date: 09/05/2013 Discharge date: 09/10/2013  Discharge Diagnoses:  Active Problems:   CVA (cerebral infarction)   Spastic hemiplegia affecting dominant side   Ileus, postoperative   Hypokalemia   Nonspecific (abnormal) findings on radiological and other examination of gastrointestinal tract   Discharged Condition: Guarded.  Significant Diagnostic Studies: Dg Chest 2 View  09/06/2013   CLINICAL DATA:  Stroke with chest pain.  EXAM: CHEST  2 VIEW  COMPARISON:  08/25/2013  FINDINGS: Heart, mediastinum and hila are unremarkable.  Lung volumes are low. Allowing for this, lungs are clear. No pleural effusion or pneumothorax. The bony thorax is demineralized but grossly intact.  IMPRESSION: No active cardiopulmonary disease.   Electronically Signed   By: Lajean Manes M.D.   On: 09/06/2013 21:46    Dg Abd 1 View  09/09/2013   CLINICAL DATA:  History of vomiting and and ileus  EXAM: ABDOMEN - 1 VIEW  COMPARISON:  DG ABD 1 VIEW dated 09/08/2013  FINDINGS: Multiple dilated loops of large and small bowel are again appreciated. There has been interval partial decompression of the cecum now measuring 10.4 cm in diameter. Otherwise no further change in the abdomen findings. Skin staples are identified within the lower portions of the abdomen. There is mild levoscoliosis of the lumbar spine likely positional. Degenerative changes appreciated within the lower lumbar spine.  IMPRESSION: Findings again consistent with an adynamic ileus. There is been a component of partial decompression of the cecum. Continued surveillance evaluation recommended.   Electronically Signed   By: Margaree Mackintosh M.D.   On: 09/09/2013 08:04   Dg Abd 1 View  09/08/2013   CLINICAL DATA:  Abdominal distention, nausea/vomiting  EXAM: ABDOMEN - 1 VIEW  COMPARISON:  09/07/2013  FINDINGS: Diffusely dilated loops of small and large bowel,  favored to reflect adynamic ileus, unchanged.  Cecum measures 13.0 cm, previously 13.4 cm, grossly unchanged.  Skin staples overlying the lower abdomen.  Degenerative changes of the lumbar spine.  IMPRESSION: Diffusely dilated loops of small and large bowel, favored to reflect adynamic ileus, unchanged.   Electronically Signed   By: Julian Hy M.D.   On: 09/08/2013 13:42   Dg Abd 1 View  09/07/2013   ADDENDUM REPORT: 09/07/2013 14:48  ADDENDUM: Initial report by Dr. Dorann Lodge. Addendum by Dr. Jobe Igo. The reported rectal catheter is actually a bladder catheter, per discussion with the patient's nurse.   Electronically Signed   By: Abigail Miyamoto M.D.   On: 09/07/2013 14:48   09/07/2013   CLINICAL DATA:  Abdominal pain and distention.  EXAM: ABDOMEN - 1 VIEW  COMPARISON:  MR PROSTATE W / WO CM dated 06/17/2013; DG CHEST 2 VIEW dated 09/06/2013  FINDINGS: Gaseous distended small and large bowel, the cecum is distended at 13 cm. No intra-abdominal mass effect or pathologic calcifications. Apparent rectal tube in place. Skin staples seen along the abdomen. Limited assessment for free air on these supine views.  IMPRESSION: Gaseous distended small and large bowel, cecum measures up to 13 cm, constellation of findings suggests ileus.  Electronically Signed: By: Elon Alas On: 09/07/2013 06:01    Labs:  Basic Metabolic Panel:  Recent Labs Lab 09/05/13 1700 09/06/13 1040 09/07/13 7902 09/08/13 0645 09/08/13 1138 09/09/13 0500 09/10/13 0625 09/10/13 1238 09/10/13 1800  NA  --  127* 130* 131* 134* 136* 136* 137 136*  K  --  3.8 3.0* 2.8* 3.5*  2.7* 2.4* 2.5* 2.3*  CL  --  85* 85* 87* 89* 91* 94* 95* 94*  CO2  --  27 24 25 26 25 27 27 28   GLUCOSE  --  122* 114* 119* 115* 128* 139* 127* 111*  BUN  --  13 23 20 20 22 19 18 15   CREATININE 1.00 1.36* 1.52* 1.13 1.24 1.28 0.97 0.99 0.93  CALCIUM  --  8.6 8.3* 8.7 8.9 8.7 8.4 8.4 8.2*  MG  --   --   --   --   --   --  2.1  --   --      CBC:  Recent Labs Lab 09/06/13 1040 09/08/13 0645 09/09/13 0649  WBC 11.1* 8.3 9.4  NEUTROABS  --  6.7 7.7  HGB 11.1* 11.3* 10.7*  HCT 31.9* 31.4* 30.4*  MCV 96.1 93.7 94.4  PLT 235 330 361    CBG: No results found for this basename: GLUCAP,  in the last 168 hours  Brief HPI:   Tanner [name] is a 58 y.o. right-handed male with history of hypertension, alcoholic hepatitis as well as diagnosis of prostate cancer. He was admitted 08/29/2012 for robotic -assisted laparoscopic radical prostatectomy 08/29/2013 per Dr. Jasmine December. Post op with onset of right side weakness. MRI of the brain showed acute nonhemorrhagic infarct extending from posterior left lenticular nucleus to posterior left corona radiata. Incidental finding of right ICA aneurysm with planned workup as outpatient. Echocardiogram with ejection fraction of 65% and no wall motion abnormalities. Carotid Dopplers with no ICA stenosis.  Neurology services consulted and recommended aspirin therapy for CVA prophylaxis. Patient is tolerating a regular diet. Urology services i advised to keep Foley catheter tube x10 days and JP drain removed  09/05/2013 with anticipated of some increased drainage over the next 2-3 days. He was evaluated by rehab team and CIR recommended for follow up.    Hospital Course: Tanner [name] was admitted to rehab 09/05/2013 for inpatient therapies to consist of PT, ST and OT at least three hours five days a week. Past admission physiatrist, therapy team and rehab RN have worked together to provide customized collaborative inpatient rehab. He has had complaints of persistent N/V with GERD as well as poor po intake. He was started on IV zofran. He had syncopal episode on 09/06/13 and CT head showed known focal infarct above posterior limb internal capsule. KUB done on 09/07/13 showed gaseous distension of small and large bowel consistent with ileus as well as hypokalemia. He was also noted to have febrile episode due to  E coli UTI and was started on Zosyn epically. He was placed on clear liquids without improvement and Dr. Henrene Pastor was contacted for input. He was treated with reglan X 2 days and rectal tube was placed to help with decompression. This did show partial decompression of cecum but patient continued to have intermittent N/V as well as difficulty with mobility.  He was treated with runs of K x 2 days with minimal improvement. On 09/10/13 was noted to have lethargy with malaise. NGT was placed to intermittent suction with 400 cc bilious output.  Potasium was down from 2.5 to  2.3 on  past three runs of K. Three additional runs ordered.  Due to patient's medical instability, [facility] was contacted to transfer patient to monitored unit and patient discharged to acute services on 09/10/13   Disposition:  Step down.    Diet: NPO except for ice chips.   Special Instructions: Continue NGT and  rectal tube.    Scheduled Meds: . aspirin EC  81 mg Oral Daily  .  ceFAZolin (ANCEF) IV  1 g Intravenous 3 times per day  . clonazePAM  0.5 mg Oral Daily  . doxazosin  8 mg Oral Daily  . heparin  5,000 Units Subcutaneous 3 times per day  . metoprolol tartrate  25 mg Oral BID  . pantoprazole (PROTONIX) IV  40 mg Intravenous Q24H  . potassium chloride  10 mEq Intravenous Q1 Hr x 3  . senna-docusate  1 tablet Oral BID   Continuous Infusions: . dexrose 5 % and 0.45 % NaCl with KCl 30 mEq/L 75 mL/hr at 09/10/13 1337   PRN Meds:.acetaminophen, hyoscyamine, neomycin-bacitracin-polymyxin, ondansetron (ZOFRAN) IV, ondansetron, sodium chloride, sorbitol, traMADol, traZODone       Follow-up Information   Follow up with GREEN, Keenan Bachelor, MD.   Specialty:  Internal Medicine   Contact information:   [address] [place] [address] [phone]       Signed: Bary [name] 09/10/2013, 7:24 PM

## 2013-09-10 NOTE — Patient Care Conference (Signed)
Inpatient RehabilitationTeam Conference and Plan of Care Update Date: 09/10/2013   Time: 11;30 Am    Patient Name: Tanner [name]      Medical Record Number: [id number]  Date of Birth: Jun 10, 1956 Sex: Male         Room/Bed: 4M01C/4M01C-01 Payor Info: Payor: MEDICAID [place] / Plan: MEDICAID [place] ACCESS / Product Type: *No Product type* /    Admitting Diagnosis: LEFT CVA  Admit Date/Time:  09/05/2013  4:06 PM Admission Comments: No comment available   Primary Diagnosis:  <principal problem not specified> Principal Problem: <principal problem not specified>  Patient Active Problem List   Diagnosis Date Noted  . Spastic hemiplegia affecting dominant side 09/09/2013  . Ileus, postoperative 09/09/2013  . Hypokalemia 09/09/2013  . Nonspecific (abnormal) findings on radiological and other examination of gastrointestinal tract 09/09/2013  . CVA (cerebral infarction) 09/05/2013  . 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 Malignant neoplasm of prostate 08/29/2013  . Prostate cancer 05/22/2013  . Hyponatremia 12/11/2012    Class: Chronic  . Ankle fracture 12/10/2012    Expected Discharge Date: Expected Discharge Date: 09/26/13  Team Members Present: Physician leading conference: Dr. Alysia Penna Social Worker Present: Alfonse Alpers, LCSW;Becky Valery Amedee, LCSW Nurse Present: Other (comment) Loree Fee Reardon-RN) PT Present: Cameron Sprang, Cecille Rubin, PT OT Present: Gareth Morgan, OT;Kris Leia Alf, OT SLP Present: Germain Osgood, SLP PPS Coordinator present : Ileana Ladd, Lelan Pons, RN, CRRN     Current Status/Progress Goal Weekly Team Focus  Medical   not tolerating therapy with post op ileus  improve activity tolerance  NG tube, rectal tube, IVF, GI consults   Bowel/Bladder   Ileus- rectal tube in place;  still has notable nausea/vomiting- not eating; coude foley in place  Ileus to resolve and resume normal pattern of bowel emptying  Maintain current regimen    Swallow/Nutrition/ Hydration     WFL        ADL's   mod assist overall on eval (no clothes on eval), minimal participation since due to nausea and vomiting and placement of rectal tube and central line.    supervision/setup overall, min assist shower transfer  OOB activity tolerance, RUE NM re-ed, self-care retraining   Mobility   Mod-max A overall  mod I transfers, supervision gait  OOB activity tolerance and tolerance to upright, transfers, gait   Communication     [facility]        Safety/Cognition/ Behavioral Observations  n/a         Pain   abdominal tenderness; not requesting pain medication  < 3  Assess and treat for pain q shift and prn   Skin   puncture sites to abdomen, some closed with staples, some not; scant drainage noted. on air overlay mattress  Remain free from infection or breakdown while in rehab  Assess skin q shift and prn      *See Care Plan and progress notes for long and short-term goals.  Barriers to Discharge: ileus which is prolonging Rehab stay    Possible Resolutions to Barriers:  set D/C     Discharge Planning/Teaching Needs:  Home with daughter there at night and sister's asssitng during the day, been sick with his ileus-missed therapies      Team Discussion:  NG tube-multiple medical issues- illeus.  Hopefully resolving now. Make 15/7 due to deconditioning and medical issues.  Sister here often  Revisions to Treatment Plan:  Medical issues-limiting his participation in therapies. 15/7 as of now until can build up  endurance   Continued Need for Acute Rehabilitation Level of Care: The patient requires daily medical management by a physician with specialized training in physical medicine and rehabilitation for the following conditions: Daily direction of a multidisciplinary physical rehabilitation program to ensure safe treatment while eliciting the highest outcome that is of practical value to the patient.: Yes Daily medical management of patient  stability for increased activity during participation in an intensive rehabilitation regime.: Yes Daily analysis of laboratory values and/or radiology reports with any subsequent need for medication adjustment of medical intervention for : Neurological problems;Other;Cardiac problems  Elease Hashimoto 09/10/2013, 2:14 PM

## 2013-09-10 NOTE — Progress Notes (Signed)
Went to check on patient since he was lethargic most of the afternoon.  Upon entering room, patient was alert, eyes open, answering questions appropriately.  Patient stated he feels "like crap."  He states is throat is very sore from the NG tube.  Offered patient ice chips to soothe throat irritation, patient declined.  I noticed the patient looked flushed, checked temperature and got 99.1 orally.  NG tube still hooked up to low wall suction, producing smaller amounts of dark green fluid.  Patients family at the bedside, seem more concerned about change in patients status.  Called Algis Liming, PA for advise.  Notified her of patients current condition and she asked to be notified of patients potassium lab results when they return.  Will continue to monitor patients condition closely.  Brita Romp, RN

## 2013-09-10 NOTE — Progress Notes (Signed)
Physical Therapy Session Note  Patient Details  Name: Tanner [name] MRN: [id number] Date of Birth: Jan 26, 1956  Today's Date: 09/10/2013 Time: 3154-0086  Time Calculation (min): 52 min   Short Term Goals: Week 1:  PT Short Term Goal 1 (Week 1): Patient will be able to perform bed mobility with Mod-Independence. PT Short Term Goal 2 (Week 1): Patient will be able to perform transfers with min-assist. PT Short Term Goal 3 (Week 1): Patient will be able to ambulate with LRAD x 100 feet with min-assist. PT Short Term Goal 4 (Week 1): Patient will be able to ascend/descend 5 steps with Mod-assist.  Skilled Therapeutic Interventions/Progress Updates:   Pt asleep in bed on L side.  Pt easily awakened; pt reporting another restless night of sleep; denies pain but does report nausea and vomiting x 2.  Pt agreeable to OOB activity.  Pt vitals assessed in supine, sit and after standing to transfer to w/c; RN alerted to drop in BP but significant HR elevation.  Upon sitting EOB pt did vomit a small amount of green emesis.  RN administered BP meds with pt in w/c.  Pt required mod-max A for side > sit EOB with total verbal cues for sequencing and max A for sit <> stand and stand pivot bed > w/c > recliner at end of session with HHA, lifting and lowering assistance and assistance to bring COG fully anterior over BOS and verbal cues for sequencing of pivot.  Transported to gym in w/c total A and performed bilat LE strengthening and trunk control training in unsupported sitting in w/c (avoided standing activity secondary to elevated HR); performed 12-15 reps R and LLE closed chain ankle pumps, theraband resisted hamstring curls, and LAQ with verbal and visual cues for correction of trunk position (pt tends to lean posterior with L rib flaring and weight over L pelvis). Returned to room and performed stand pivot to recliner.  Pt set up in recliner with LE extended to rest before OT session.    Second session:  Pt asleep  in bed; GI MD also present to do quick assessment of pt.  Pt did not awaken during assessment.  PT and RN attempted to arouse pt with touch, name calling and rolling pt to L side.  Pt briefly awakened to express extreme fatigue and requesting to stay in the bed to rest.  Pt positioned with pillows on L side and left to rest. Will f/u tomorrow.  Therapy Documentation Precautions:  Precautions Precautions: Fall Precaution Comments: Foley, rectal tube and IV; monitor vitals, air mattress overlay SR x 4 Restrictions Weight Bearing Restrictions: No General: Amount of Missed PT Time (min): 60 Minutes Missed Time Reason: Patient fatigue Vital Signs: Therapy Vitals Pulse Rate: 107 BP: 123/84 mmHg Patient Position, if appropriate: Standing (before BP meds given) Pain: Pain Assessment Pain Assessment: No/denies pain Pain Score: 0-No pain Locomotion : Wheelchair Mobility Distance: 150   See FIM for current functional status  Therapy/Group: Individual Therapy  [name] Faucette 09/10/2013, 11:10 AM

## 2013-09-10 NOTE — Progress Notes (Signed)
Occupational Therapy Note  Patient Details  Name: Tanner [name] MRN: [id number] Date of Birth: Sep 26, 1955 Today's Date: 09/10/2013  Pt missed 30 mins skilled OT treatment session secondary to fatigue.  RN aware.  Will follow up as able.  Simonne Come 09/10/2013, 2:42 PM

## 2013-09-11 ENCOUNTER — Encounter (HOSPITAL_COMMUNITY): Payer: Self-pay | Admitting: General Practice

## 2013-09-11 ENCOUNTER — Inpatient Hospital Stay (HOSPITAL_COMMUNITY): Payer: Medicaid Other

## 2013-09-11 ENCOUNTER — Encounter (HOSPITAL_COMMUNITY): Payer: Medicaid Other | Admitting: Occupational Therapy

## 2013-09-11 ENCOUNTER — Inpatient Hospital Stay (HOSPITAL_COMMUNITY): Payer: Medicaid Other | Admitting: Physical Therapy

## 2013-09-11 ENCOUNTER — Inpatient Hospital Stay (HOSPITAL_COMMUNITY): Payer: Medicaid Other | Admitting: Occupational Therapy

## 2013-09-11 DIAGNOSIS — R933 Abnormal findings on diagnostic imaging of other parts of digestive tract: Secondary | ICD-10-CM

## 2013-09-11 DIAGNOSIS — A0472 Enterocolitis due to Clostridium difficile, not specified as recurrent: Secondary | ICD-10-CM | POA: Diagnosis present

## 2013-09-11 DIAGNOSIS — N390 Urinary tract infection, site not specified: Secondary | ICD-10-CM | POA: Diagnosis present

## 2013-09-11 DIAGNOSIS — K560 Paralytic ileus: Secondary | ICD-10-CM

## 2013-09-11 DIAGNOSIS — E876 Hypokalemia: Secondary | ICD-10-CM

## 2013-09-11 DIAGNOSIS — K929 Disease of digestive system, unspecified: Secondary | ICD-10-CM

## 2013-09-11 DIAGNOSIS — I10 Essential (primary) hypertension: Secondary | ICD-10-CM | POA: Diagnosis present

## 2013-09-11 LAB — COMPREHENSIVE METABOLIC PANEL
ALBUMIN: 2.3 g/dL — AB (ref 3.5–5.2)
ALT: 20 U/L (ref 0–53)
AST: 30 U/L (ref 0–37)
Alkaline Phosphatase: 47 U/L (ref 39–117)
BUN: 13 mg/dL (ref 6–23)
CALCIUM: 8.5 mg/dL (ref 8.4–10.5)
CO2: 26 mEq/L (ref 19–32)
CREATININE: 0.9 mg/dL (ref 0.50–1.35)
Chloride: 95 mEq/L — ABNORMAL LOW (ref 96–112)
GFR calc Af Amer: >90 mL/min (ref >90)
GFR calc non Af Amer: >90 mL/min (ref >90)
Glucose, Bld: 95 mg/dL (ref 70–99)
Potassium: 2.9 mEq/L — CL (ref 3.7–5.3)
Sodium: 137 mEq/L (ref 137–147)
TOTAL PROTEIN: 5.9 g/dL — AB (ref 6.0–8.3)
Total Bilirubin: 0.3 mg/dL (ref 0.3–1.2)

## 2013-09-11 LAB — CBC WITH DIFFERENTIAL/PLATELET
Basophils Absolute: 0.1 10*3/uL (ref 0.0–0.1)
Basophils Relative: 1 % (ref 0–1)
EOS ABS: 0.2 10*3/uL (ref 0.0–0.7)
EOS PCT: 2 % (ref 0–5)
HEMATOCRIT: 29.6 % — AB (ref 39.0–52.0)
HEMOGLOBIN: 10 g/dL — AB (ref 13.0–17.0)
LYMPHS ABS: 0.8 10*3/uL (ref 0.7–4.0)
LYMPHS PCT: 9 % — AB (ref 12–46)
MCH: 32.6 pg (ref 26.0–34.0)
MCHC: 33.8 g/dL (ref 30.0–36.0)
MCV: 96.4 fL (ref 78.0–100.0)
MONO ABS: 1.3 10*3/uL — AB (ref 0.1–1.0)
MONOS PCT: 16 % — AB (ref 3–12)
Neutro Abs: 5.8 10*3/uL (ref 1.7–7.7)
Neutrophils Relative %: 72 % (ref 43–77)
Platelets: 336 10*3/uL (ref 150–400)
RBC: 3.07 MIL/uL — AB (ref 4.22–5.81)
RDW: 12.9 % (ref 11.5–15.5)
WBC: 8.1 10*3/uL (ref 4.0–10.5)

## 2013-09-11 LAB — CREATININE, SERUM
Creatinine, Ser: 0.94 mg/dL (ref 0.50–1.35)
GFR calc non Af Amer: >90 mL/min (ref >90)

## 2013-09-11 LAB — GLUCOSE, CAPILLARY
GLUCOSE-CAPILLARY: 98 mg/dL (ref 70–99)
Glucose-Capillary: 104 mg/dL — ABNORMAL HIGH (ref 70–99)
Glucose-Capillary: 98 mg/dL (ref 70–99)

## 2013-09-11 LAB — BASIC METABOLIC PANEL
BUN: 12 mg/dL (ref 6–23)
CO2: 23 mEq/L (ref 19–32)
Calcium: 8.4 mg/dL (ref 8.4–10.5)
Chloride: 97 mEq/L (ref 96–112)
Creatinine, Ser: 0.84 mg/dL (ref 0.50–1.35)
Glucose, Bld: 89 mg/dL (ref 70–99)
Potassium: 3.1 mEq/L — ABNORMAL LOW (ref 3.7–5.3)
Sodium: 136 mEq/L — ABNORMAL LOW (ref 137–147)

## 2013-09-11 LAB — CLOSTRIDIUM DIFFICILE BY PCR: Toxigenic C. Difficile by PCR: POSITIVE — AB

## 2013-09-11 LAB — MAGNESIUM: MAGNESIUM: 2 mg/dL (ref 1.5–2.5)

## 2013-09-11 MED ORDER — IOHEXOL 300 MG/ML  SOLN
25.0000 mL | INTRAMUSCULAR | Status: AC
  Administered 2013-09-11 (×2): 25 mL via ORAL

## 2013-09-11 MED ORDER — IOHEXOL 300 MG/ML  SOLN
80.0000 mL | Freq: Once | INTRAMUSCULAR | Status: AC | PRN
  Administered 2013-09-11: 80 mL via INTRAVENOUS

## 2013-09-11 MED ORDER — POTASSIUM CHLORIDE 10 MEQ/100ML IV SOLN
10.0000 meq | INTRAVENOUS | Status: AC
  Administered 2013-09-11 (×6): 10 meq via INTRAVENOUS
  Filled 2013-09-11 (×6): qty 100

## 2013-09-11 MED ORDER — METRONIDAZOLE IN NACL 5-0.79 MG/ML-% IV SOLN
500.0000 mg | Freq: Three times a day (TID) | INTRAVENOUS | Status: DC
  Administered 2013-09-11 – 2013-09-26 (×45): 500 mg via INTRAVENOUS
  Filled 2013-09-11 (×51): qty 100

## 2013-09-11 MED ORDER — VANCOMYCIN 50 MG/ML ORAL SOLUTION
500.0000 mg | Freq: Four times a day (QID) | ORAL | Status: DC
  Administered 2013-09-11 – 2013-09-17 (×24): 500 mg
  Filled 2013-09-11 (×31): qty 10

## 2013-09-11 MED ORDER — CEFAZOLIN SODIUM 1-5 GM-% IV SOLN
1.0000 g | Freq: Three times a day (TID) | INTRAVENOUS | Status: DC
  Administered 2013-09-11 – 2013-09-16 (×14): 1 g via INTRAVENOUS
  Filled 2013-09-11 (×18): qty 50

## 2013-09-11 NOTE — Progress Notes (Signed)
PROGRESS NOTE    Jafar Poffenberger JKK:[id number] DOB: Jan 02, 1956 DOA: 09/10/2013 PCP: Criselda Peaches, MD Primary Urologist: Dr. Rolan Bucco  HPI/Brief narrative 58 year old male with history of prostate cancer, hypertension, alcoholic hepatitis, status post radical robotic prostatectomy with bilateral pelvic lymph node dissection and lysis of colonic additions on 08/29/13, postop acute left corner radiata infarct with associated right hemiparesis on 09/02/13, transferred to inpatient rehabilitation on 09/05/13, developed nausea, vomiting, diarrhea and abdominal distention. GI was consulted and diagnosed postop ileus-narcotics were stopped, NG tube and rectal tube were placed, started on IV Reglan, found to be hypokalemic and urine cultures grew Escherichia coli 55,000 colonies. Due to worsening condition, he was transferred to the hospital on 09/10/13 for further management.  Assessment/Plan:  1. Ileus versus SBO: Ileus probably multifactorial from immobility, C. difficile, hypokalemia. CT abdomen however suggest SBO with transition point. Continue n.p.o., NG tube, rectal tube, IV fluids, aggressive potassium replacement and follow clinically. GI is following (reminded) and surgeons consulted. 2. Severe hypokalemia: Secondary to GI losses. Improved after 6 runs of IV potassium runs overnight. Continue to replace IV aggressively and follow BMP closely. Magnesium normal. 3. C. difficile colitis: Unable to use by mouth or rectal medications secondary to problem #1. IV Flagyl until able to take by mouth. Not on PPIs. Minimize antibiotics. 4. Escherichia coli UTI versus colonization: Patient is on day 2 of IV antibiotics. Discussed with primary urologist will see today and decide need for further antibiotics. 5. Hypertension: Mildly uncontrolled. Continue IV metoprolol. 6. Anemia: Stable. 7. Recent stroke with right hemiparesis: Unable to use rectal or oral aspirin secondary to problem #1-resume when able  to. 8. Recent radical robotic prostatectomy: Discussed with Dr. Jasmine December, urology who will followup today. 9. Incidental finding of right ICA aneurysm: Will need followup with repeat imaging in one year.   Code Status: Full Family Communication: Discussed extensively with patient spouse at bedside. Disposition Plan: ? Return to CIR when medically stable   Consultants:  Gastroenterology  General surgery  Urology  Procedures:  Foley catheter  NG tube  Rectal tube  Antibiotics:  IV cefazolin 3/3 > 3/4  IV Rocephin 3/4 >  Subjective: Decreased vomiting but had a couple of episodes in CT. Abdominal distention is unchanged but denies pain. No chest pain or dyspnea.  Objective: Filed Vitals:   09/10/13 2113 09/11/13 0031 09/11/13 0500 09/11/13 0621  BP: 144/88 147/88  162/97  Pulse: 110 111  105  Temp: 99.6 F (37.6 C)   98.6 F (37 C)  TempSrc: Oral   Oral  Resp: 20 18  18   Height: 5\' 10"  (1.778 m)     Weight: 71 kg (156 lb 8.4 oz)  71 kg (156 lb 8.4 oz)   SpO2: 93%   95%    Intake/Output Summary (Last 24 hours) at 09/11/13 1054 Last data filed at 09/11/13 0700  Gross per 24 hour  Intake      0 ml  Output   1100 ml  Net  -1100 ml   Filed Weights   09/10/13 2113 09/11/13 0500  Weight: 71 kg (156 lb 8.4 oz) 71 kg (156 lb 8.4 oz)     Exam:  General exam: Moderately built and nourished male lying comfortably in bed Respiratory system: Slightly diminished breath sounds in the bases but otherwise clear to auscultation. No increased work of breathing. Cardiovascular system: S1 & S2 heard, RRR. No JVD, murmurs, gallops, clicks or pedal edema. Telemetry: Sinus tachycardia in the low 100s.  Gastrointestinal system: Abdomen is markedly distended but not tense, nontender and diminished bowel sounds. Central nervous system: Alert and oriented. No cranial deficits. Extremities: 5 x 5 power in left limbs and 4 x 5 power in right limbs   Data Reviewed: Basic  Metabolic Panel:  Recent Labs Lab 09/09/13 0500 09/10/13 0625 09/10/13 1238 09/10/13 1800 09/10/13 2230 09/10/13 2325 09/11/13 0453  NA 136* 136* 137 136* 136*  --  137  K 2.7* 2.4* 2.5* 2.3* 2.5*  --  2.9*  CL 91* 94* 95* 94* 95*  --  95*  CO2 25 27 27 28 27   --  26  GLUCOSE 128* 139* 127* 111* 120*  --  95  BUN 22 19 18 15 14   --  13  CREATININE 1.28 0.97 0.99 0.93 0.94 0.94 0.90  CALCIUM 8.7 8.4 8.4 8.2* 8.1*  --  8.5  MG  --  2.1  --   --   --  2.0  --    Liver Function Tests:  Recent Labs Lab 09/06/13 1040 09/08/13 0645 09/11/13 0453  AST 15 11 30   ALT 9 8 20   ALKPHOS 52 48 47  BILITOT 0.6 0.5 0.3  PROT 6.3 6.1 5.9*  ALBUMIN 2.4* 2.1* 2.3*    Recent Labs Lab 09/06/13 1040  LIPASE 14   No results found for this basename: AMMONIA,  in the last 168 hours CBC:  Recent Labs Lab 09/06/13 1040 09/08/13 0645 09/09/13 0649 09/10/13 2325 09/11/13 0453  WBC 11.1* 8.3 9.4 7.8 8.1  NEUTROABS  --  6.7 7.7  --  5.8  HGB 11.1* 11.3* 10.7* 9.8* 10.0*  HCT 31.9* 31.4* 30.4* 28.6* 29.6*  MCV 96.1 93.7 94.4 95.3 96.4  PLT 235 330 361 308 336   Cardiac Enzymes: No results found for this basename: CKTOTAL, CKMB, CKMBINDEX, TROPONINI,  in the last 168 hours BNP (last 3 results) No results found for this basename: PROBNP,  in the last 8760 hours CBG:  Recent Labs Lab 09/11/13 0030 09/11/13 0602  GLUCAP 98 104*    Recent Results (from the past 240 hour(s))  CULTURE, BLOOD (ROUTINE X 2)     Status: None   Collection Time    09/06/13  6:33 PM      Result Value Ref Range Status   Specimen Description BLOOD RIGHT ARM   Final   Special Requests BOTTLES DRAWN AEROBIC AND ANAEROBIC 5CC   Final   Culture  Setup Time     Final   Value: 09/07/2013 02:57     Performed at [facility]   Culture     Final   Value:        BLOOD CULTURE RECEIVED NO GROWTH TO DATE CULTURE WILL BE HELD FOR 5 DAYS BEFORE ISSUING A FINAL NEGATIVE REPORT     Performed at [facility]   Report Status PENDING   Incomplete  CULTURE, BLOOD (ROUTINE X 2)     Status: None   Collection Time    09/06/13  6:45 PM      Result Value Ref Range Status   Specimen Description BLOOD RIGHT HAND   Final   Special Requests BOTTLES DRAWN AEROBIC AND ANAEROBIC 5CC   Final   Culture  Setup Time     Final   Value: 09/07/2013 02:57     Performed at [facility]   Culture     Final   Value:        BLOOD CULTURE  RECEIVED NO GROWTH TO DATE CULTURE WILL BE HELD FOR 5 DAYS BEFORE ISSUING A FINAL NEGATIVE REPORT     Performed at [facility]   Report Status PENDING   Incomplete  URINE CULTURE     Status: None   Collection Time    09/06/13  8:15 PM      Result Value Ref Range Status   Specimen Description URINE, RANDOM   Final   Special Requests BAG   Final   Culture  Setup Time     Final   Value: 09/07/2013 03:12     Performed at [facility] Count     Final   Value: 55,000 COLONIES/ML     Performed at [facility]   Culture     Final   Value: ESCHERICHIA COLI     Performed at [facility]   Report Status 09/08/2013 FINAL   Final   Organism ID, Bacteria ESCHERICHIA COLI   Final  CLOSTRIDIUM DIFFICILE BY PCR     Status: Abnormal   Collection Time    09/11/13 12:09 AM      Result Value Ref Range Status   C difficile by pcr POSITIVE (*) NEGATIVE Final   Comment: CRITICAL RESULT CALLED TO, READ BACK BY AND VERIFIED WITH:     Coralyn Mark RN 09/11/13 0740 COSTELLO B       Studies: Ct Abdomen Pelvis W Contrast  09/11/2013   CLINICAL DATA:  Ileus versus small-bowel obstruction  EXAM: CT ABDOMEN AND PELVIS WITH CONTRAST  TECHNIQUE: Multidetector CT imaging of the abdomen and pelvis was performed using the standard protocol following bolus administration of intravenous contrast.  CONTRAST:  4mL OMNIPAQUE IOHEXOL 300 MG/ML  SOLN  COMPARISON:  DG ABD PORTABLE 1V dated 09/10/2013  FINDINGS: Lung bases are clear.  Pericardial fluid.  As NG  tube in stomach. Oral contrast into the stomach. The duodenum is minimally dilated. The jejunum is dilated up to 5 cm. Oral contrast progresses to the mid small bowel. The distal small bowel isfluid-filled up to 3.6 cm in the right lower quadrant. There is poor progression of the oral contrast into the distal small bowel. Within the most distal small bowel, leading up to the terminal ileum, the small bowel is collapsed measuring 15 mm. The cecum is anterior medial in the abdomen. Potential caliber change in the small bowel in the right lower quadrant along the abdominal wall. No evidence of pneumatosis or portal venous gas.  Liver is normal. The pancreas, spleen, adrenal glands, and kidneys are normal.  No retroperitoneal periportal lymphadenopathy. There is a Foley catheter in the bladder. Rectal Tube within the rectum. No pelvic lymphadenopathy. Patient status post prostatectomy. No aggressive osseous lesion.  IMPRESSION: 1. Findings are most suggestive of mechanical small bowel obstruction with transition point in the distal small bowel of the right lower quadrant. No obstructing lesion identified. Query prior abdominal surgery. 2. No evidence of portal venous gas or intraperitoneal free air. 3. Findings similar to plain film findings. This was made a call report.   Electronically Signed   By: Suzy Bouchard M.D.   On: 09/11/2013 08:11   Dg Chest Port 1 View  09/10/2013   CLINICAL DATA:  Cough check for infiltrates.  EXAM: PORTABLE CHEST - 1 VIEW  COMPARISON:  DG CHEST 2 VIEW dated 09/06/2013  FINDINGS: The heart size and mediastinal contours are within normal limits. Both lungs are clear. The visualized skeletal structures are unremarkable.  An NG tube is appreciated with tip projecting in the region of the stomach. A right-sided central venous catheter via a subclavian approach is identified tip projecting region superior vena caval right atrial junction. The aorta is tortuous.  IMPRESSION: No active disease.    Electronically Signed   By: Margaree Mackintosh M.D.   On: 09/10/2013 22:05   Dg Abd Portable 1v  09/11/2013   CLINICAL DATA:  Evaluate for small bowel obstruction  EXAM: PORTABLE ABDOMEN - 1 VIEW  COMPARISON:  CT ABD/PELVIS W CM dated 09/11/2013  FINDINGS: Air is appreciated within multiple dilated loops small bowel. There is a paucity of distal bowel gas. In NG tube projects in the region of a gaseous distended stomach. There is mild levoscoliosis of thoracolumbar spine. Surgical clips project right lower quadrant of the abdomen.  IMPRESSION: Findings consistent with small-bowel obstruction. There is no appreciable distal bowel gas. Continued surveillance evaluation recommended.   Electronically Signed   By: Margaree Mackintosh M.D.   On: 09/11/2013 10:14   Dg Abd Portable 1v  09/11/2013   CLINICAL DATA:  Nausea and vomiting  EXAM: PORTABLE ABDOMEN - 1 VIEW  COMPARISON:  09/09/2013  FINDINGS: Dilated small bowel, measuring up to 6 cm. Dilated structure in the central abdomen with lobulated mucosa remains dilated as well, the cecum on subsequent abdominal CT. Nasogastric tube enters the collapsed stomach. Persistent Foley catheter and rectal tube.  IMPRESSION: Ongoing distal small bowel obstruction.   Electronically Signed   By: Jorje Guild M.D.   On: 09/11/2013 07:31        Scheduled Meds: . aspirin  325 mg Oral Daily  . cefTRIAXone (ROCEPHIN)  IV  1 g Intravenous Q24H  . enoxaparin (LOVENOX) injection  40 mg Subcutaneous QHS  . metoprolol  2.5 mg Intravenous 4 times per day  . metronidazole  500 mg Intravenous Q8H  . potassium chloride  10 mEq Intravenous Q1 Hr x 6   Continuous Infusions: . 0.9 % NaCl with KCl 40 mEq / L 100 mL/hr at 09/10/13 2156    Principal Problem:   Ileus Active Problems:   Malignant neoplasm of prostate   CVA (cerebral infarction)   Hypokalemia    Time spent: 17 minutes    Arsen Mangione, MD, FACP, FHM. [facility] Pager [phone]  If 7PM-7AM, please  contact night-coverage [email or web address] Password [email or web address] 09/11/2013, 10:54 AM    LOS: 1 day

## 2013-09-11 NOTE — Consult Note (Signed)
Patient interviewed and examined, imaging personally reviewed, agree with PA note above. He may well have an ileus although partial small bowel obstruction cannot be completely ruled out. His abdomen is somewhat distended but better after NG placement and nontender on exam. Nonoperative management certainly the best course early on postoperatively as he is. Will follow. I expect he would benefit from TNA.  Edward Jolly MD, FACS  09/11/2013 5:38 PM

## 2013-09-11 NOTE — Evaluation (Signed)
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 Now transfered back to acute for ileus vs SBO.  Clinical Impression  Pt admitted from inpatient rehab with ileus and above complications.  Pt currently limited functionally due to the problems listed. ( See problems list.)   Pt will benefit from PT to maximize function and safety in order to get ready for next venue listed below.     PT Assessment  Patient needs continued PT services    Follow Up Recommendations  CIR    Does the patient have the potential to tolerate intense rehabilitation      Barriers to Discharge        Equipment Recommendations  Other (comment) (TBA)    Recommendations for Other Services Rehab consult   Frequency Min 4X/week    Precautions / Restrictions Precautions Precautions: Fall Restrictions Weight Bearing Restrictions: No   Pertinent Vitals/Pain EHR into the 150's,  BP 143/96,       Mobility  Bed Mobility Overal bed mobility: Needs Assistance Bed Mobility: Supine to Sit;Sit to Supine Supine to sit: Mod assist;HOB elevated Sit to supine: Mod assist General bed mobility comments: Bridged with min, needed truncal assist Transfers Overall transfer level: Needs assistance Equipment used:  (iv pole) Transfers: Sit to/from Stand Sit to Stand: Mod assist General transfer comment: stability assist, assist to come forward, pt <75% Ambulation/Gait Ambulation/Gait assistance: Mod assist Ambulation Distance (Feet): 2 Feet (forward and back and 5 steps in place.) Assistive device:  (iv pole) Gait Pattern/deviations: Step-to pattern;Decreased step length -  right;Decreased step length - left;Decreased stance time - right;Shuffle (R knee instability with WB, ) Modified Rankin (Stroke Patients Only) Pre-Morbid Rankin Score: No symptoms Modified Rankin: Moderately severe disability    Exercises     PT Diagnosis: Other (comment);Difficulty walking (hemiparesis)  PT Problem List: Decreased strength;Decreased activity tolerance;Decreased balance;Decreased mobility;Decreased knowledge of use of DME;Decreased knowledge of precautions;Cardiopulmonary status limiting activity PT Treatment Interventions: DME instruction;Gait training;Functional mobility training;Therapeutic activities;Balance training;Patient/family education;Neuromuscular re-education     PT Goals(Current goals can be found in the care plan section) Acute Rehab PT Goals Patient Stated Goal: recover and get back to being independent PT Goal Formulation: With patient/family Time For Goal Achievement: 09/25/13 Potential to Achieve Goals: Good  Visit Information  Last PT Received On: 09/11/13 Assistance Needed: +2 (for safety and amb) History of Present Illness: 58 yo male s/p prostatectomy 2/20 for malignant neoplasm and 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 Now transfered back to acute for ileus vs SBO.       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 instability at knee, df 2/5, hams 3+/5 RLE Sensation: decreased light touch RLE Coordination: decreased fine motor LLE Deficits / Details: WFL   Balance Balance Overall balance assessment: Needs assistance Sitting-balance support: No upper extremity supported;Single extremity supported;Feet supported Sitting balance-Leahy Scale: Fair Standing balance support: Single extremity supported Standing balance-Leahy Scale: Fair  End of Session PT - End of Session Activity Tolerance: Patient tolerated treatment well;Patient limited by fatigue;Other (comment) (limited by tachycardia) Patient left: in bed;with call bell/phone within reach;with family/visitor present Nurse Communication: Mobility status  GP     Aaleah Hirsch, Tessie Fass 09/11/2013, 6:37 PM 09/11/2013  Donnella Sham, [place] [phone]  (pager)

## 2013-09-11 NOTE — Progress Notes (Addendum)
Received report from day shift nurse. Patients potassium level 2.3 with patient on 3rd potassium 10 meq IV run. Algis Liming, PA notified by day shift nurse. Orders given for 3 runs of Potassium 10 meq IV by PA, stat ABG.  Patient alert and oriented x 4 over weekend. Saturday 09/06/13 patient with abdominal distention and discomfort with yellow drainage from right lateral side of abdomin. Patient with nausea, vomiting, loose stools with poor urinary output 50 ml in foley catheter. Notified on call physician Dr. Leanne Chang. Order given for 500 ml bolus normal saline over 1 hr, Normal saline 250 ml/hr x 4 hrs, Normal saline 150 ml/hr continuous. Patients with shortness of breath. O 2 saturations decreased to 89% on RA.  Dr. Leanne Chang notified. Orders given to decrease Normal saline to 50 ml/hr continuous, nasal cannula 2 liters O 2. Urinary output increased to 400 ml. Sunday 09/07/13 patient with emesis x 3 black color with foul odor and multiple loose black stools.  Dr. Leanne Chang notified. Order given for stat Bmet and KUB, findings suggest ileus.  On 09/08/13 flexiseal placed in patients rectum for loose stools.  On 09/10/13 day shift nurse inserted nasogastric tube 14 fr. Output 400 ml's greenish/ black color. Patient lethargic with temp. 99.3 at beginning of shift at 1930. Rapid response called for assistance to assess patient. Algis Liming , Pa gave order to transfer patient to telemetry in Dr. Moise Boring care.  Patient transferred to 2 west 18 at 2120 due to hypokalemia. S/p robot assisted radical prostatectomy/Left CVA via bed.  adm

## 2013-09-11 NOTE — Progress Notes (Signed)
Progress Note   Subjective  complains of abdominal discomfort, nausea.    Objective   Vital signs in last 24 hours: Temp:  [98.6 F (37 C)-99.6 F (37.6 C)] 98.6 F (37 C) (03/05 0621) Pulse Rate:  [95-111] 105 (03/05 0621) Resp:  [18-20] 18 (03/05 0621) BP: (132-162)/(82-97) 162/97 mmHg (03/05 0621) SpO2:  [92 %-95 %] 95 % (03/05 0621) Weight:  [156 lb 8.4 oz (71 kg)] 156 lb 8.4 oz (71 kg) (03/05 0500) Last BM Date: 09/11/13 General:    white male in NAD Abdomen:  Soft, mild diffuse tenderness. He is largely distended. NGT to wall suction, difficult to assess presence of any bowel sounds even with pinching off tube.  Neurologic:  Alert and oriented,  grossly normal neurologically. Psych:  Cooperative. Normal mood and affect.  Lab Results:  Recent Labs  09/09/13 0649 09/10/13 2325 09/11/13 0453  WBC 9.4 7.8 8.1  HGB 10.7* 9.8* 10.0*  HCT 30.4* 28.6* 29.6*  PLT 361 308 336   BMET  Recent Labs  09/10/13 1800 09/10/13 2230 09/10/13 2325 09/11/13 0453  NA 136* 136*  --  137  K 2.3* 2.5*  --  2.9*  CL 94* 95*  --  95*  CO2 28 27  --  26  GLUCOSE 111* 120*  --  95  BUN 15 14  --  13  CREATININE 0.93 0.94 0.94 0.90  CALCIUM 8.2* 8.1*  --  8.5   LFT  Recent Labs  09/11/13 0453  PROT 5.9*  ALBUMIN 2.3*  AST 30  ALT 20  ALKPHOS 8  BILITOT 0.3    Studies/Results: Ct Abdomen Pelvis W Contrast  09/11/2013   CLINICAL DATA:  Ileus versus small-bowel obstruction  EXAM: CT ABDOMEN AND PELVIS WITH CONTRAST  TECHNIQUE: Multidetector CT imaging of the abdomen and pelvis was performed using the standard protocol following bolus administration of intravenous contrast.  CONTRAST:  87mL OMNIPAQUE IOHEXOL 300 MG/ML  SOLN  COMPARISON:  DG ABD PORTABLE 1V dated 09/10/2013  FINDINGS: Lung bases are clear.  Pericardial fluid.  As NG tube in stomach. Oral contrast into the stomach. The duodenum is minimally dilated. The jejunum is dilated up to 5 cm. Oral contrast progresses  to the mid small bowel. The distal small bowel isfluid-filled up to 3.6 cm in the right lower quadrant. There is poor progression of the oral contrast into the distal small bowel. Within the most distal small bowel, leading up to the terminal ileum, the small bowel is collapsed measuring 15 mm. The cecum is anterior medial in the abdomen. Potential caliber change in the small bowel in the right lower quadrant along the abdominal wall. No evidence of pneumatosis or portal venous gas.  Liver is normal. The pancreas, spleen, adrenal glands, and kidneys are normal.  No retroperitoneal periportal lymphadenopathy. There is a Foley catheter in the bladder. Rectal Tube within the rectum. No pelvic lymphadenopathy. Patient status post prostatectomy. No aggressive osseous lesion.  IMPRESSION: 1. Findings are most suggestive of mechanical small bowel obstruction with transition point in the distal small bowel of the right lower quadrant. No obstructing lesion identified. Query prior abdominal surgery. 2. No evidence of portal venous gas or intraperitoneal free air. 3. Findings similar to plain film findings. This was made a call report.   Electronically Signed   By: Suzy Bouchard M.D.   On: 09/11/2013 08:11   Dg Chest Port 1 View  09/10/2013   CLINICAL DATA:  Cough check for infiltrates.  EXAM: PORTABLE CHEST - 1 VIEW  COMPARISON:  DG CHEST 2 VIEW dated 09/06/2013  FINDINGS: The heart size and mediastinal contours are within normal limits. Both lungs are clear. The visualized skeletal structures are unremarkable. An NG tube is appreciated with tip projecting in the region of the stomach. A right-sided central venous catheter via a subclavian approach is identified tip projecting region superior vena caval right atrial junction. The aorta is tortuous.  IMPRESSION: No active disease.   Electronically Signed   By: Margaree Mackintosh M.D.   On: 09/10/2013 22:05   Dg Abd Portable 1v  09/11/2013   CLINICAL DATA:  Evaluate for  small bowel obstruction  EXAM: PORTABLE ABDOMEN - 1 VIEW  COMPARISON:  CT ABD/PELVIS W CM dated 09/11/2013  FINDINGS: Air is appreciated within multiple dilated loops small bowel. There is a paucity of distal bowel gas. In NG tube projects in the region of a gaseous distended stomach. There is mild levoscoliosis of thoracolumbar spine. Surgical clips project right lower quadrant of the abdomen.  IMPRESSION: Findings consistent with small-bowel obstruction. There is no appreciable distal bowel gas. Continued surveillance evaluation recommended.   Electronically Signed   By: Margaree Mackintosh M.D.   On: 09/11/2013 10:14   Dg Abd Portable 1v  09/11/2013   CLINICAL DATA:  Nausea and vomiting  EXAM: PORTABLE ABDOMEN - 1 VIEW  COMPARISON:  09/09/2013  FINDINGS: Dilated small bowel, measuring up to 6 cm. Dilated structure in the central abdomen with lobulated mucosa remains dilated as well, the cecum on subsequent abdominal CT. Nasogastric tube enters the collapsed stomach. Persistent Foley catheter and rectal tube.  IMPRESSION: Ongoing distal small bowel obstruction.   Electronically Signed   By: Jorje Guild M.D.   On: 09/11/2013 07:31     Assessment / Plan:   1. Post-op ileus vrs SBO. Prior imaging studies have suggested diffuse ileus but now plain films and CTscan suggesting distal SBO. Surgery is evaluating. Continue NGT (he has had 850cc out since 7am). Continue NPO.   2. Persistent hypokalemia, Mg+ normal at 2. He needs aggressive K+ repletion.   3. C-diff. IV Flagyl was started this am. Will add Vancomycin suspension per NGT   LOS: 1 day   Tye Savoy  09/11/2013, 11:13 AM   GI ATTENDING  Interval history, laboratories, events, x-rays reviewed. Patient seen and examined. Complicated course overnight. In addition to problems with ileus and hypokalemia, now with Clostridium difficile. In addition to IV metronidazole would add vancomycin suspension 500 mg 4 times a day. Primary service to replete  potassium. Continue with NG decompression. Monitor films and clinical status. Will follow.  Docia Chuck. Geri Seminole., M.D. [facility] Division of Gastroenterology

## 2013-09-11 NOTE — Consult Note (Signed)
Urology Consult  Requesting provider:  Dr. Vernell Leep  CC: Colitis. UTI.  HPI: 58 year old male who recently had a radical robotic prostatectomy 08/29/13.  This was complicated by some nausea as well as a stroke.  He was admitted to inpatient rehabilitation.  The patient started noticing worsening abdominal distention. He developed nausea, emesis, and profuse diarrhea earlier this week. He then developed profound hypokalemia.  He was transferred back to [facility].  A CT of his abdomen which reveals dilated small bowel and there was concern over possible small bowel obstruction.  Of note, the patient has C. Difficile colitis.  During the surgery, he did have some adhesions of the sigmoid bowel to the anterior abdominal wall.  This was dissected easily without any problems.  CT scan shows no evidence of bowel injury.  It would be unlikely to have a small bowel injury with prostatectomy, but not impossible.  He has been evaluated by general surgery who recommended conservative management.  I agree at this point.  He was also found to have urinary tract infection.  It is not clear whether this was drawn from his catheter bag or irrigated from his catheter.  In either case, he does have an indwelling catheter since his surgery which is not unusual to see bacteria at this time.  This is Escherichia coli and it is pansensitive.  He is at the point where we would recommend removing his catheter and his postoperative course. This is not associated with gross hematuria.  Nothing makes it better or worse.  He has been treated with Ancef and Rocephin.  PMH: Past Medical History  Diagnosis Date  . Alcoholic hepatitis   . Hypertension   . 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 Shortness of breath     new onset- occasionally-at rest and exertion  . GERD (gastroesophageal reflux disease)   . Anxiety   . TIA (transient ischemic attack) 08/2013  . Arthritis     " in my spine "  . Ileus  09/2013    PSH: Past Surgical History  Procedure Laterality Date  . Hand surgery Right   . Hernia repair    . Orif ankle fracture Right 12/10/2012    Procedure: OPEN REDUCTION INTERNAL FIXATION (ORIF) ANKLE FRACTURE;  Surgeon: Hessie Dibble, MD;  Location: [facility];  Service: Orthopedics;  Laterality: Right;  . Robot assisted laparoscopic radical prostatectomy N/A 08/29/2013    Procedure: ROBOTIC ASSISTED LAPAROSCOPIC RADICAL PROSTATECTOMY LEVEL 2, Lysis of adhesions;  Surgeon: Molli Hazard, MD;  Location: [facility];  Service: Urology;  Laterality: N/A;  . Lymphadenectomy Bilateral 08/29/2013    Procedure: LYMPHADENECTOMY "BILATERAL PELVIC LYMPH NODE DISSECTION";  Surgeon: Molli Hazard, MD;  Location: [facility];  Service: Urology;  Laterality: Bilateral;    Allergies: Allergies  Allergen Reactions  . Dilaudid [Hydromorphone Hcl] Nausea And Vomiting    Pre pt history  . Oxycodone Nausea And Vomiting  . Procaine Hcl Other (See Comments)    Patient states he is allergic to novicaine  . Zofran [Ondansetron] Nausea And Vomiting  . Sulfa Antibiotics Rash    Medications: Prescriptions prior to admission  Medication Sig Dispense Refill  . aspirin EC 81 MG EC tablet Take 1 tablet (81 mg total) by mouth daily.  1 tablet  0  . Aspirin-Acetaminophen-Caffeine (GOODYS EXTRA STRENGTH) 260-130-16 MG TABS Take 1 Package by mouth daily as needed (for pain).      . clonazePAM (KLONOPIN) 0.5  MG tablet Take 0.5 mg by mouth 3 (three) times daily.       Marland Kitchen doxazosin (CARDURA) 8 MG tablet Take 8 mg by mouth daily.      . furosemide (LASIX) 40 MG tablet Take 20 mg by mouth every morning.       . hyoscyamine (LEVSIN, ANASPAZ) 0.125 MG tablet Take 1 tablet (0.125 mg total) by mouth every 4 (four) hours as needed (bladder spasms).  40 tablet  4  . lisinopril (PRINIVIL,ZESTRIL) 40 MG tablet Take 40 mg by mouth every morning.       . metoprolol (LOPRESSOR) 50 MG tablet Take 50 mg by mouth 2 (two)  times daily.      Marland Kitchen morphine (MSIR) 15 MG tablet Take 1 tablet (15 mg total) by mouth every 4 (four) hours as needed for moderate pain or severe pain.  50 tablet  0  . omeprazole (PRILOSEC) 40 MG capsule Take 40 mg by mouth 2 (two) times daily.       Marland Kitchen OVER THE COUNTER MEDICATION Apply 1 application topically daily as needed (for skin).      Marland Kitchen senna-docusate (SENOKOT S) 8.6-50 MG per tablet Take 1 tablet by mouth 2 (two) times daily.  60 tablet  0     Social History: History   Social History  . Marital Status: Divorced    Spouse Name: N/A    Number of Children: 1  . Years of Education: N/A   Occupational History  . disabled    Social History Main Topics  . Smoking status: Former Smoker -- 1.50 packs/day for 20 years    Quit date: 12/21/2012  . Smokeless tobacco: Never Used  . Alcohol Use: 0.0 oz/week     Comment: 5 beers day  . Drug Use: Yes     Comment: marijuana-  last joint 08/24/13   cocaine 15 yrs ago  . Sexual Activity: Not on file   Other Topics Concern  . Not on file   Social History Narrative  . No narrative on file    Family History: Family History  Problem Relation Age of Onset  . Pancreatic cancer Sister   . Lung cancer Father     Review of Systems: Positive: Nausea, diarrhea, abdominal distention, right arm/leg weakness. Negative: Abdominal pain, incision drainage, rash.  A further 10 point review of systems was negative except what is listed in the HPI.  Physical Exam: Filed Vitals:   09/11/13 1332  BP: 151/86  Pulse: 105  Temp: 98.7 F (37.1 C)  Resp: 18    General: No acute distress.  Awake. Head:  Normocephalic.  Atraumatic. ENT:  EOMI.  Mucous membranes moist Neck:  Supple.  No lymphadenopathy. CV:  S1 present. S2 present. Regular rate. Pulmonary: Equal effort bilaterally.  Clear to auscultation bilaterally. Abdomen: Soft.  Distended. Non- tender to palpation. Incisions clean/dry/intact. Skin:  Normal turgor.  No visible  rash. Extremity: No gross deformity of bilateral upper extremities.  No gross deformity of    bilateral lower extremities. Neurologic: RUE/RLE weakness. RUE/RLE paresthesia.  GU:  Foley in place. Negative GU edema. Urine clear yellow.    Studies:  Recent Labs     09/10/13  2325  09/11/13  0453  HGB  9.8*  10.0*  WBC  7.8  8.1  PLT  308  336    Recent Labs     09/10/13  2230  09/10/13  2325  09/11/13  0453  NA  136*   --  137  K  2.5*   --   2.9*  CL  95*   --   95*  CO2  27   --   26  BUN  14   --   13  CREATININE  0.94  0.94  0.90  CALCIUM  8.1*   --   8.5  GFRNONAA  >90  >90  >90  GFRAA  >90  >90  >90     No results found for this basename: PT, INR, APTT,  in the last 72 hours   No components found with this basename: ABG,     Assessment:  Colitis. UTI.  Plan: At this point I would generally recommend removing his Foley catheter, but in light of what is going on with his bowels, I think it would be beneficial to obtain a cystogram prior.  Seeing as how he had recent oral contrast I doubt that this contrast has passed yet.  I will therefore recommend obtaining a plain film x-ray in the mornings.  If the contrast is passed and we can consider a plain film cystogram before removing his catheter.  I do recommend ongoing treatment for his UTI.  I will order IV Ancef as the pharmacy notes mention earlier that Rocephin is too broad spectrum given the fact that he has C. Difficile colitis.  Given his bowel issues, he continues to have output from his bowels.  I do recommend continuing the NG tube.  I appreciate general surgery following along.  I will follow along as well.    Pager: [phone]    CC: Dr. Vernell Leep

## 2013-09-11 NOTE — Discharge Summary (Signed)
Discharge summary job [id number]

## 2013-09-11 NOTE — Consult Note (Signed)
Tanner [name] 1956-06-09  [id number].   Requesting MD: Dr. Vernell Leep Chief Complaint/Reason for Consult: ileus vs sbo HPI: This is a 58 yo white male who has prostate cancer who underwent robotic prostatectomy by Dr. Jasmine December in February.  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  He was later transferred to rehab.  The patient and his sisters state, he has never eaten well since surgery.  Only jello, but has been having nausea ever since.  He began having emesis and abdominal distention that was worsening in rehab over the last several days.  He has also started having copious diarrhea.  His K is persistently low.  He had a CT scan after being admitted back to the main hospital that said SBO.  He has had an NGT placed with about 2 L of output since yesterday.  He was checked for C.diff and is positive.  We have been asked to see him.  ROS: Please see HPI, otherwise all other systems are currently negative  Family History  Problem Relation Age of Onset  . Pancreatic cancer Sister   . Lung cancer Father     Past Medical History  Diagnosis Date  . Alcoholic hepatitis   . Hypertension   . 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 Shortness of breath     new onset- occasionally-at rest and exertion  . GERD (gastroesophageal reflux disease)   . Anxiety   . TIA (transient ischemic attack) 08/2013  . Arthritis     " in my spine "  . Ileus 09/2013    Past Surgical History  Procedure Laterality Date  . Hand surgery Right   . Hernia repair    . Orif ankle fracture Right 12/10/2012    Procedure: OPEN REDUCTION INTERNAL FIXATION (ORIF) ANKLE FRACTURE;  Surgeon: Hessie Dibble, MD;  Location: [facility];  Service: Orthopedics;  Laterality: Right;  . Robot assisted laparoscopic radical prostatectomy N/A 08/29/2013    Procedure: ROBOTIC ASSISTED LAPAROSCOPIC RADICAL PROSTATECTOMY LEVEL 2, Lysis of adhesions;   Surgeon: Molli Hazard, MD;  Location: [facility];  Service: Urology;  Laterality: N/A;  . Lymphadenectomy Bilateral 08/29/2013    Procedure: LYMPHADENECTOMY "BILATERAL PELVIC LYMPH NODE DISSECTION";  Surgeon: Molli Hazard, MD;  Location: [facility];  Service: Urology;  Laterality: Bilateral;    Social History:  reports that he quit smoking about 8 months ago. He has never used smokeless tobacco. He reports that he drinks alcohol. He reports that he uses illicit drugs.  Allergies:  Allergies  Allergen Reactions  . Dilaudid [Hydromorphone Hcl] Nausea And Vomiting    Pre pt history  . Oxycodone Nausea And Vomiting  . Procaine Hcl Other (See Comments)    Patient states he is allergic to novicaine  . Zofran [Ondansetron] Nausea And Vomiting  . Sulfa Antibiotics Rash    Medications Prior to Admission  Medication Sig Dispense Refill  . aspirin EC 81 MG EC tablet Take 1 tablet (81 mg total) by mouth daily.  1 tablet  0  . Aspirin-Acetaminophen-Caffeine (GOODYS EXTRA STRENGTH) 260-130-16 MG TABS Take 1 Package by mouth daily as needed (for pain).      . clonazePAM (KLONOPIN) 0.5 MG tablet Take 0.5 mg by mouth 3 (three) times daily.       Marland Kitchen doxazosin (CARDURA) 8 MG tablet Take 8 mg by mouth daily.      Marland Kitchen  furosemide (LASIX) 40 MG tablet Take 20 mg by mouth every morning.       . hyoscyamine (LEVSIN, ANASPAZ) 0.125 MG tablet Take 1 tablet (0.125 mg total) by mouth every 4 (four) hours as needed (bladder spasms).  40 tablet  4  . lisinopril (PRINIVIL,ZESTRIL) 40 MG tablet Take 40 mg by mouth every morning.       . metoprolol (LOPRESSOR) 50 MG tablet Take 50 mg by mouth 2 (two) times daily.      Marland Kitchen morphine (MSIR) 15 MG tablet Take 1 tablet (15 mg total) by mouth every 4 (four) hours as needed for moderate pain or severe pain.  50 tablet  0  . omeprazole (PRILOSEC) 40 MG capsule Take 40 mg by mouth 2 (two) times daily.       Marland Kitchen OVER THE COUNTER MEDICATION Apply 1 application topically daily  as needed (for skin).      Marland Kitchen senna-docusate (SENOKOT S) 8.6-50 MG per tablet Take 1 tablet by mouth 2 (two) times daily.  60 tablet  0    Blood pressure 162/97, pulse 105, temperature 98.6 F (37 C), temperature source Oral, resp. rate 18, height 5' 10"  (1.778 m), weight 156 lb 8.4 oz (71 kg), SpO2 95.00%. Physical Exam: General: pleasant, WD, WN white male who is laying in bed in NAD HEENT: head is normocephalic, atraumatic.  Sclera are noninjected.  PERRL.  Ears and nose without any masses or lesions.  NGt in place with copious bilious output.  Mouth is pink. Heart: regular, rate, and rhythm.  Normal s1,s2. No obvious murmurs, gallops, or rubs noted.  Palpable radial and pedal pulses bilaterally Lungs: CTAB, no wheezes, rhonchi, or rales noted.  Respiratory effort nonlabored Abd: soft, mild tenderness, distention is present with hypoactive to absent bowel sounds. no masses, hernias, or organomegaly.  He has multiple small incisions with staples still intact from his prostatectomy.  MS: all 4 extremities are symmetrical with no cyanosis, clubbing, or edema.  He has significant decreased strength throughout the right side of his extremities as well as decreased tactile sensation Skin: warm and dry with no masses, lesions, or rashes Psych: A&Ox3 with an appropriate affect.    Results for orders placed during the hospital encounter of 09/10/13 (from the past 48 hour(s))  MAGNESIUM     Status: None   Collection Time    09/10/13 11:25 PM      Result Value Ref Range   Magnesium 2.0  1.5 - 2.5 mg/dL  CBC     Status: Abnormal   Collection Time    09/10/13 11:25 PM      Result Value Ref Range   WBC 7.8  4.0 - 10.5 K/uL   RBC 3.00 (*) 4.22 - 5.81 MIL/uL   Hemoglobin 9.8 (*) 13.0 - 17.0 g/dL   HCT 28.6 (*) 39.0 - 52.0 %   MCV 95.3  78.0 - 100.0 fL   MCH 32.7  26.0 - 34.0 pg   MCHC 34.3  30.0 - 36.0 g/dL   RDW 12.8  11.5 - 15.5 %   Platelets 308  150 - 400 K/uL  CREATININE, SERUM      Status: None   Collection Time    09/10/13 11:25 PM      Result Value Ref Range   Creatinine, Ser 0.94  0.50 - 1.35 mg/dL   GFR calc non Af Amer >90  >90 mL/min   GFR calc Af Amer >90  >90 mL/min   Comment: (NOTE)  The eGFR has been calculated using the CKD EPI equation.     This calculation has not been validated in all clinical situations.     eGFR's persistently <90 mL/min signify possible Chronic Kidney     Disease.  CLOSTRIDIUM DIFFICILE BY PCR     Status: Abnormal   Collection Time    09/11/13 12:09 AM      Result Value Ref Range   C difficile by pcr POSITIVE (*) NEGATIVE   Comment: CRITICAL RESULT CALLED TO, READ BACK BY AND VERIFIED WITH:     SHELTON H RN 09/11/13 0740 COSTELLO B  GLUCOSE, CAPILLARY     Status: None   Collection Time    09/11/13 12:30 AM      Result Value Ref Range   Glucose-Capillary 98  70 - 99 mg/dL  COMPREHENSIVE METABOLIC PANEL     Status: Abnormal   Collection Time    09/11/13  4:53 AM      Result Value Ref Range   Sodium 137  137 - 147 mEq/L   Potassium 2.9 (*) 3.7 - 5.3 mEq/L   Comment: CRITICAL RESULT CALLED TO, READ BACK BY AND VERIFIED WITH:     Adalberto Cole AT 3790 09/11/13 BY ZBEECH.   Chloride 95 (*) 96 - 112 mEq/L   CO2 26  19 - 32 mEq/L   Glucose, Bld 95  70 - 99 mg/dL   BUN 13  6 - 23 mg/dL   Creatinine, Ser 0.90  0.50 - 1.35 mg/dL   Calcium 8.5  8.4 - 10.5 mg/dL   Total Protein 5.9 (*) 6.0 - 8.3 g/dL   Albumin 2.3 (*) 3.5 - 5.2 g/dL   AST 30  0 - 37 U/L   ALT 20  0 - 53 U/L   Alkaline Phosphatase 47  39 - 117 U/L   Total Bilirubin 0.3  0.3 - 1.2 mg/dL   GFR calc non Af Amer >90  >90 mL/min   GFR calc Af Amer >90  >90 mL/min   Comment: (NOTE)     The eGFR has been calculated using the CKD EPI equation.     This calculation has not been validated in all clinical situations.     eGFR's persistently <90 mL/min signify possible Chronic Kidney     Disease.  CBC WITH DIFFERENTIAL     Status: Abnormal   Collection Time     09/11/13  4:53 AM      Result Value Ref Range   WBC 8.1  4.0 - 10.5 K/uL   RBC 3.07 (*) 4.22 - 5.81 MIL/uL   Hemoglobin 10.0 (*) 13.0 - 17.0 g/dL   HCT 29.6 (*) 39.0 - 52.0 %   MCV 96.4  78.0 - 100.0 fL   MCH 32.6  26.0 - 34.0 pg   MCHC 33.8  30.0 - 36.0 g/dL   RDW 12.9  11.5 - 15.5 %   Platelets 336  150 - 400 K/uL   Neutrophils Relative % 72  43 - 77 %   Neutro Abs 5.8  1.7 - 7.7 K/uL   Lymphocytes Relative 9 (*) 12 - 46 %   Lymphs Abs 0.8  0.7 - 4.0 K/uL   Monocytes Relative 16 (*) 3 - 12 %   Monocytes Absolute 1.3 (*) 0.1 - 1.0 K/uL   Eosinophils Relative 2  0 - 5 %   Eosinophils Absolute 0.2  0.0 - 0.7 K/uL   Basophils Relative 1  0 - 1 %  Basophils Absolute 0.1  0.0 - 0.1 K/uL  GLUCOSE, CAPILLARY     Status: Abnormal   Collection Time    09/11/13  6:02 AM      Result Value Ref Range   Glucose-Capillary 104 (*) 70 - 99 mg/dL   Ct Abdomen Pelvis W Contrast  09/11/2013   CLINICAL DATA:  Ileus versus small-bowel obstruction  EXAM: CT ABDOMEN AND PELVIS WITH CONTRAST  TECHNIQUE: Multidetector CT imaging of the abdomen and pelvis was performed using the standard protocol following bolus administration of intravenous contrast.  CONTRAST:  46m OMNIPAQUE IOHEXOL 300 MG/ML  SOLN  COMPARISON:  DG ABD PORTABLE 1V dated 09/10/2013  FINDINGS: Lung bases are clear.  Pericardial fluid.  As NG tube in stomach. Oral contrast into the stomach. The duodenum is minimally dilated. The jejunum is dilated up to 5 cm. Oral contrast progresses to the mid small bowel. The distal small bowel isfluid-filled up to 3.6 cm in the right lower quadrant. There is poor progression of the oral contrast into the distal small bowel. Within the most distal small bowel, leading up to the terminal ileum, the small bowel is collapsed measuring 15 mm. The cecum is anterior medial in the abdomen. Potential caliber change in the small bowel in the right lower quadrant along the abdominal wall. No evidence of pneumatosis or  portal venous gas.  Liver is normal. The pancreas, spleen, adrenal glands, and kidneys are normal.  No retroperitoneal periportal lymphadenopathy. There is a Foley catheter in the bladder. Rectal Tube within the rectum. No pelvic lymphadenopathy. Patient status post prostatectomy. No aggressive osseous lesion.  IMPRESSION: 1. Findings are most suggestive of mechanical small bowel obstruction with transition point in the distal small bowel of the right lower quadrant. No obstructing lesion identified. Query prior abdominal surgery. 2. No evidence of portal venous gas or intraperitoneal free air. 3. Findings similar to plain film findings. This was made a call report.   Electronically Signed   By: SSuzy BouchardM.D.   On: 09/11/2013 08:11   Dg Chest Port 1 View  09/10/2013   CLINICAL DATA:  Cough check for infiltrates.  EXAM: PORTABLE CHEST - 1 VIEW  COMPARISON:  DG CHEST 2 VIEW dated 09/06/2013  FINDINGS: The heart size and mediastinal contours are within normal limits. Both lungs are clear. The visualized skeletal structures are unremarkable. An NG tube is appreciated with tip projecting in the region of the stomach. A right-sided central venous catheter via a subclavian approach is identified tip projecting region superior vena caval right atrial junction. The aorta is tortuous.  IMPRESSION: No active disease.   Electronically Signed   By: HMargaree MackintoshM.D.   On: 09/10/2013 22:05   Dg Abd Portable 1v  09/11/2013   CLINICAL DATA:  Evaluate for small bowel obstruction  EXAM: PORTABLE ABDOMEN - 1 VIEW  COMPARISON:  CT ABD/PELVIS W CM dated 09/11/2013  FINDINGS: Air is appreciated within multiple dilated loops small bowel. There is a paucity of distal bowel gas. In NG tube projects in the region of a gaseous distended stomach. There is mild levoscoliosis of thoracolumbar spine. Surgical clips project right lower quadrant of the abdomen.  IMPRESSION: Findings consistent with small-bowel obstruction. There is no  appreciable distal bowel gas. Continued surveillance evaluation recommended.   Electronically Signed   By: HMargaree MackintoshM.D.   On: 09/11/2013 10:14   Dg Abd Portable 1v  09/11/2013   CLINICAL DATA:  Nausea and vomiting  EXAM: PORTABLE ABDOMEN -  1 VIEW  COMPARISON:  09/09/2013  FINDINGS: Dilated small bowel, measuring up to 6 cm. Dilated structure in the central abdomen with lobulated mucosa remains dilated as well, the cecum on subsequent abdominal CT. Nasogastric tube enters the collapsed stomach. Persistent Foley catheter and rectal tube.  IMPRESSION: Ongoing distal small bowel obstruction.   Electronically Signed   By: Jorje Guild M.D.   On: 09/11/2013 07:31       Assessment/Plan 1. Ileus, multifactorial 2. S/p prostatectomy 3. CVA with right sided hemiparesis 4. Hypokalemia 5. C.diff colitis  Plan: 1. The patient does not appear to have a SBO, but more likely an ileus as he is passing copious amounts of stool.  He also has multiple risk factors for an ileus, such as CVA, c.diff colitis, recent surgery, immobility, and hypokalemia.  Agree with continued NGT and bowel rest for decompression.  Aggressively replace potassium.  Try to get this as close to 4 or above as possible.  He is losing a ton of K from his stool and NGT output.  Trying to mobilize him as much as possible with PT would be ideal as well.  Limit narcotics as able.  May want to update urology on patient's status. 2. We will follow along with you.   Deni Lefever E 09/11/2013, 11:26 AM Pager: [phone]

## 2013-09-12 ENCOUNTER — Inpatient Hospital Stay (HOSPITAL_COMMUNITY): Payer: Medicaid Other

## 2013-09-12 DIAGNOSIS — I10 Essential (primary) hypertension: Secondary | ICD-10-CM

## 2013-09-12 LAB — BASIC METABOLIC PANEL
BUN: 11 mg/dL (ref 6–23)
BUN: 11 mg/dL (ref 6–23)
CALCIUM: 8.7 mg/dL (ref 8.4–10.5)
CO2: 27 mEq/L (ref 19–32)
CO2: 26 mEq/L (ref 19–32)
CREATININE: 0.84 mg/dL (ref 0.50–1.35)
CREATININE: 0.84 mg/dL (ref 0.50–1.35)
Calcium: 8.2 mg/dL — ABNORMAL LOW (ref 8.4–10.5)
Chloride: 99 mEq/L (ref 96–112)
Chloride: 103 mEq/L (ref 96–112)
GFR calc non Af Amer: >90 mL/min (ref >90)
GFR calc non Af Amer: >90 mL/min (ref >90)
Glucose, Bld: 93 mg/dL (ref 70–99)
Glucose, Bld: 97 mg/dL (ref 70–99)
POTASSIUM: 3.4 meq/L — AB (ref 3.7–5.3)
Potassium: 3.2 mEq/L — ABNORMAL LOW (ref 3.7–5.3)
Sodium: 140 mEq/L (ref 137–147)
Sodium: 144 mEq/L (ref 137–147)

## 2013-09-12 LAB — GLUCOSE, CAPILLARY
GLUCOSE-CAPILLARY: 103 mg/dL — AB (ref 70–99)
GLUCOSE-CAPILLARY: 106 mg/dL — AB (ref 70–99)
Glucose-Capillary: 83 mg/dL (ref 70–99)
Glucose-Capillary: 89 mg/dL (ref 70–99)
Glucose-Capillary: 95 mg/dL (ref 70–99)

## 2013-09-12 LAB — CBC
HCT: 30.3 % — ABNORMAL LOW (ref 39.0–52.0)
Hemoglobin: 10.1 g/dL — ABNORMAL LOW (ref 13.0–17.0)
MCH: 32.3 pg (ref 26.0–34.0)
MCHC: 33.3 g/dL (ref 30.0–36.0)
MCV: 96.8 fL (ref 78.0–100.0)
Platelets: 332 10*3/uL (ref 150–400)
RBC: 3.13 MIL/uL — ABNORMAL LOW (ref 4.22–5.81)
RDW: 13.1 % (ref 11.5–15.5)
WBC: 9.1 10*3/uL (ref 4.0–10.5)

## 2013-09-12 MED ORDER — FAT EMULSION 20 % IV EMUL
250.0000 mL | INTRAVENOUS | Status: AC
  Administered 2013-09-12: 250 mL via INTRAVENOUS
  Filled 2013-09-12: qty 250

## 2013-09-12 MED ORDER — INSULIN ASPART 100 UNIT/ML ~~LOC~~ SOLN
0.0000 [IU] | Freq: Four times a day (QID) | SUBCUTANEOUS | Status: DC
  Administered 2013-09-13 – 2013-09-16 (×9): 1 [IU] via SUBCUTANEOUS

## 2013-09-12 MED ORDER — POTASSIUM CHLORIDE IN NACL 40-0.9 MEQ/L-% IV SOLN
INTRAVENOUS | Status: AC
  Administered 2013-09-12: 03:00:00 via INTRAVENOUS
  Filled 2013-09-12: qty 1000

## 2013-09-12 MED ORDER — TRACE MINERALS CR-CU-F-FE-I-MN-MO-SE-ZN IV SOLN
INTRAVENOUS | Status: AC
  Administered 2013-09-12: 17:00:00 via INTRAVENOUS
  Filled 2013-09-12: qty 1000

## 2013-09-12 MED ORDER — POTASSIUM CHLORIDE IN NACL 40-0.9 MEQ/L-% IV SOLN
INTRAVENOUS | Status: AC
  Administered 2013-09-13: 02:00:00 via INTRAVENOUS
  Filled 2013-09-12 (×2): qty 1000

## 2013-09-12 MED ORDER — POTASSIUM CHLORIDE IN NACL 40-0.9 MEQ/L-% IV SOLN
INTRAVENOUS | Status: DC
  Administered 2013-09-12: 15:00:00 via INTRAVENOUS
  Filled 2013-09-12: qty 1000

## 2013-09-12 MED ORDER — DIPHENHYDRAMINE HCL 50 MG/ML IJ SOLN
12.5000 mg | Freq: Once | INTRAMUSCULAR | Status: AC
  Administered 2013-09-12: 12.5 mg via INTRAVENOUS
  Filled 2013-09-12: qty 1

## 2013-09-12 MED ORDER — METOPROLOL TARTRATE 1 MG/ML IV SOLN
5.0000 mg | Freq: Four times a day (QID) | INTRAVENOUS | Status: DC
  Administered 2013-09-12 – 2013-09-19 (×27): 5 mg via INTRAVENOUS
  Filled 2013-09-12 (×28): qty 5

## 2013-09-12 MED ORDER — POTASSIUM CHLORIDE 10 MEQ/100ML IV SOLN
10.0000 meq | INTRAVENOUS | Status: AC
  Administered 2013-09-12 (×6): 10 meq via INTRAVENOUS
  Filled 2013-09-12 (×6): qty 100

## 2013-09-12 NOTE — Progress Notes (Signed)
Staples removed per MD orders; steri strips and benzoin applied; pt resting with family at bedside; call bell within reach; will continue to monitor.  Carollee Sires, RN

## 2013-09-12 NOTE — Progress Notes (Signed)
PROGRESS NOTE    Tanner [name] FHL:[id number] DOB: 1956-04-24 DOA: 09/10/2013 PCP: Criselda Peaches, MD Primary Urologist: Dr. Rolan Bucco  HPI/Brief narrative 58 year old male with history of prostate cancer, hypertension, alcoholic hepatitis, status post radical robotic prostatectomy with bilateral pelvic lymph node dissection and lysis of colonic additions on 08/29/13, postop acute left corner radiata infarct with associated right hemiparesis on 09/02/13, transferred to inpatient rehabilitation on 09/05/13, developed nausea, vomiting, diarrhea and abdominal distention. GI was consulted and diagnosed postop ileus-narcotics were stopped, NG tube and rectal tube were placed, started on IV Reglan, found to be hypokalemic and urine cultures grew Escherichia coli 55,000 colonies. Due to worsening condition, he was transferred to the hospital on 09/10/13 for further management.  Assessment/Plan:  1. Ileus versus SBO: Ileus multifactorial from immobility, C. difficile, hypokalemia. CT abdomen however suggested SBO with transition point. Continue n.p.o., NG tube, rectal tube, IV fluids, aggressive potassium replacement and follow clinically. GI surgical input appreciated-continue current conservative treatment. Follow KUB in a.m. Starting TNA 3/6. 2. Severe hypokalemia: Secondary to GI losses. Magnesium normal. Slowly improving. Continue to aggressively replace and follow BMP. 3. C. difficile colitis: Continue IV Flagyl and by mouth vancomycin added with NG tube clamping. Not on PPIs. Minimize antibiotics. 4. Escherichia coli UTI versus colonization: Patient is on day 3 of IV antibiotics-changed from Rocephin to cefazolin. Urology input appreciated-Foley decision to be made by urology. 5. Hypertension: Fluctuating and uncontrolled. Continue IV metoprolol. 6. Anemia: Stable. 7. Recent stroke with right hemiparesis: Aspirin with NG tube clamping. Physical therapy. 8. Recent radical robotic prostatectomy:  Urology has seen. 9. Incidental finding of right ICA aneurysm: Will need followup with repeat imaging in one year.   Code Status: Full Family Communication: Discussed extensively with patient sister at bedside. Disposition Plan: ? Return to CIR when medically stable   Consultants:  Gastroenterology  General surgery  Urology  Procedures:  Foley catheter  NG tube  Rectal tube  Antibiotics:  IV cefazolin 3/3 > 3/4  IV Rocephin 3/4 > 3/5  IV cefazolin 3/5 >  Subjective: Feels better. No nausea vomiting. Abdomen less distended.  Objective: Filed Vitals:   09/12/13 0609 09/12/13 0700 09/12/13 0900 09/12/13 1330  BP: 151/104  152/91 158/87  Pulse: 107  123 103  Temp: 98.9 F (37.2 C)   98.5 F (36.9 C)  TempSrc: Oral   Oral  Resp: 18   18  Height:      Weight:  80 kg (176 lb 5.9 oz)    SpO2: 95%   98%    Intake/Output Summary (Last 24 hours) at 09/12/13 1541 Last data filed at 09/12/13 1520  Gross per 24 hour  Intake 2157.5 ml  Output   3530 ml  Net -1372.5 ml   Filed Weights   09/10/13 2113 09/11/13 0500 09/12/13 0700  Weight: 71 kg (156 lb 8.4 oz) 71 kg (156 lb 8.4 oz) 80 kg (176 lb 5.9 oz)     Exam:  General exam: He looks better than he did yesterday and seen sitting comfortably on the chair. Respiratory system: Slightly diminished breath sounds in the bases but otherwise clear to auscultation. No increased work of breathing. Cardiovascular system: S1 & S2 heard, RRR. No JVD, murmurs, gallops, clicks or pedal edema. Telemetry: Sinus tachycardia in the low 100s. Gastrointestinal system: Abdomen is less distended, soft and nontender with few bowel sounds. Central nervous system: Alert and oriented. No cranial deficits. Extremities: 5 x 5 power in left limbs and 4  x 5 power in right limbs   Data Reviewed: Basic Metabolic Panel:  Recent Labs Lab 09/09/13 0500 09/10/13 0625  09/10/13 1800 09/10/13 2230 09/10/13 2325 09/11/13 0453  09/11/13 2000 09/12/13 0530  NA 136* 136*  < > 136* 136*  --  137 136* 140  K 2.7* 2.4*  < > 2.3* 2.5*  --  2.9* 3.1* 3.2*  CL 91* 94*  < > 94* 95*  --  95* 97 99  CO2 25 27  < > 28 27  --  26 23 27   GLUCOSE 128* 139*  < > 111* 120*  --  95 89 93  BUN 22 19  < > 15 14  --  13 12 11   CREATININE 1.28 0.97  < > 0.93 0.94 0.94 0.90 0.84 0.84  CALCIUM 8.7 8.4  < > 8.2* 8.1*  --  8.5 8.4 8.7  MG  --  2.1  --   --   --  2.0  --   --   --   < > = values in this interval not displayed. Liver Function Tests:  Recent Labs Lab 09/06/13 1040 09/08/13 0645 09/11/13 0453  AST 15 11 30   ALT 9 8 20   ALKPHOS 52 48 47  BILITOT 0.6 0.5 0.3  PROT 6.3 6.1 5.9*  ALBUMIN 2.4* 2.1* 2.3*    Recent Labs Lab 09/06/13 1040  LIPASE 14   No results found for this basename: AMMONIA,  in the last 168 hours CBC:  Recent Labs Lab 09/08/13 0645 09/09/13 0649 09/10/13 2325 09/11/13 0453 09/12/13 0530  WBC 8.3 9.4 7.8 8.1 9.1  NEUTROABS 6.7 7.7  --  5.8  --   HGB 11.3* 10.7* 9.8* 10.0* 10.1*  HCT 31.4* 30.4* 28.6* 29.6* 30.3*  MCV 93.7 94.4 95.3 96.4 96.8  PLT 330 361 308 336 332   Cardiac Enzymes: No results found for this basename: CKTOTAL, CKMB, CKMBINDEX, TROPONINI,  in the last 168 hours BNP (last 3 results) No results found for this basename: PROBNP,  in the last 8760 hours CBG:  Recent Labs Lab 09/11/13 0602 09/11/13 1627 09/11/13 2353 09/12/13 0608 09/12/13 1155  GLUCAP 104* 98 89 103* 95    Recent Results (from the past 240 hour(s))  CULTURE, BLOOD (ROUTINE X 2)     Status: None   Collection Time    09/06/13  6:33 PM      Result Value Ref Range Status   Specimen Description BLOOD RIGHT ARM   Final   Special Requests BOTTLES DRAWN AEROBIC AND ANAEROBIC 5CC   Final   Culture  Setup Time     Final   Value: 09/07/2013 02:57     Performed at [facility]   Culture     Final   Value:        BLOOD CULTURE RECEIVED NO GROWTH TO DATE CULTURE WILL BE HELD FOR 5 DAYS  BEFORE ISSUING A FINAL NEGATIVE REPORT     Performed at [facility]   Report Status PENDING   Incomplete  CULTURE, BLOOD (ROUTINE X 2)     Status: None   Collection Time    09/06/13  6:45 PM      Result Value Ref Range Status   Specimen Description BLOOD RIGHT HAND   Final   Special Requests BOTTLES DRAWN AEROBIC AND ANAEROBIC 5CC   Final   Culture  Setup Time     Final   Value: 09/07/2013 02:57  Performed at [facility]     Final   Value:        BLOOD CULTURE RECEIVED NO GROWTH TO DATE CULTURE WILL BE HELD FOR 5 DAYS BEFORE ISSUING A FINAL NEGATIVE REPORT     Performed at [facility]   Report Status PENDING   Incomplete  URINE CULTURE     Status: None   Collection Time    09/06/13  8:15 PM      Result Value Ref Range Status   Specimen Description URINE, RANDOM   Final   Special Requests BAG   Final   Culture  Setup Time     Final   Value: 09/07/2013 03:12     Performed at [facility]     Final   Value: 55,000 COLONIES/ML     Performed at [facility]   Culture     Final   Value: ESCHERICHIA COLI     Performed at [facility]   Report Status 09/08/2013 FINAL   Final   Organism ID, Bacteria ESCHERICHIA COLI   Final  CLOSTRIDIUM DIFFICILE BY PCR     Status: Abnormal   Collection Time    09/11/13 12:09 AM      Result Value Ref Range Status   C difficile by pcr POSITIVE (*) NEGATIVE Final   Comment: CRITICAL RESULT CALLED TO, READ BACK BY AND VERIFIED WITH:     Coralyn Mark RN 09/11/13 0740 COSTELLO B       Studies: Dg Abd 1 View  09/12/2013   CLINICAL DATA:  58 year old male requiring cystogram. Evaluation of residual oral contrast. Recent small bowel obstruction. Initial encounter.  EXAM: ABDOMEN - 1 VIEW  COMPARISON:  09/11/2013 and earlier.  FINDINGS: Rectal or bladder catheter remains in place projecting over the lower pelvis. There is a small volume of retained oral contrast in the distal colon,  in the pelvis.  Continued dilated gas-filled small bowel loops up to 60 mm diameter, not significantly changed. Scattered abdominal skin staples re- identified. Enteric tube no longer visible. Stable visualized osseous structures.  IMPRESSION: 1. Small volume retained enteric contrast in the rectosigmoid colon. 2. Stable small bowel obstruction gas pattern.   Electronically Signed   By: Lars Pinks M.D.   On: 09/12/2013 08:26   Ct Abdomen Pelvis W Contrast  09/11/2013   CLINICAL DATA:  Ileus versus small-bowel obstruction  EXAM: CT ABDOMEN AND PELVIS WITH CONTRAST  TECHNIQUE: Multidetector CT imaging of the abdomen and pelvis was performed using the standard protocol following bolus administration of intravenous contrast.  CONTRAST:  81mL OMNIPAQUE IOHEXOL 300 MG/ML  SOLN  COMPARISON:  DG ABD PORTABLE 1V dated 09/10/2013  FINDINGS: Lung bases are clear.  Pericardial fluid.  As NG tube in stomach. Oral contrast into the stomach. The duodenum is minimally dilated. The jejunum is dilated up to 5 cm. Oral contrast progresses to the mid small bowel. The distal small bowel isfluid-filled up to 3.6 cm in the right lower quadrant. There is poor progression of the oral contrast into the distal small bowel. Within the most distal small bowel, leading up to the terminal ileum, the small bowel is collapsed measuring 15 mm. The cecum is anterior medial in the abdomen. Potential caliber change in the small bowel in the right lower quadrant along the abdominal wall. No evidence of pneumatosis or portal venous gas.  Liver is normal. The pancreas, spleen, adrenal glands, and kidneys  are normal.  No retroperitoneal periportal lymphadenopathy. There is a Foley catheter in the bladder. Rectal Tube within the rectum. No pelvic lymphadenopathy. Patient status post prostatectomy. No aggressive osseous lesion.  IMPRESSION: 1. Findings are most suggestive of mechanical small bowel obstruction with transition point in the distal small bowel of  the right lower quadrant. No obstructing lesion identified. Query prior abdominal surgery. 2. No evidence of portal venous gas or intraperitoneal free air. 3. Findings similar to plain film findings. This was made a call report.   Electronically Signed   By: Suzy Bouchard M.D.   On: 09/11/2013 08:11   Dg Chest Port 1 View  09/10/2013   CLINICAL DATA:  Cough check for infiltrates.  EXAM: PORTABLE CHEST - 1 VIEW  COMPARISON:  DG CHEST 2 VIEW dated 09/06/2013  FINDINGS: The heart size and mediastinal contours are within normal limits. Both lungs are clear. The visualized skeletal structures are unremarkable. An NG tube is appreciated with tip projecting in the region of the stomach. A right-sided central venous catheter via a subclavian approach is identified tip projecting region superior vena caval right atrial junction. The aorta is tortuous.  IMPRESSION: No active disease.   Electronically Signed   By: Margaree Mackintosh M.D.   On: 09/10/2013 22:05   Dg Abd Portable 1v  09/11/2013   CLINICAL DATA:  Evaluate for small bowel obstruction  EXAM: PORTABLE ABDOMEN - 1 VIEW  COMPARISON:  CT ABD/PELVIS W CM dated 09/11/2013  FINDINGS: Air is appreciated within multiple dilated loops small bowel. There is a paucity of distal bowel gas. In NG tube projects in the region of a gaseous distended stomach. There is mild levoscoliosis of thoracolumbar spine. Surgical clips project right lower quadrant of the abdomen.  IMPRESSION: Findings consistent with small-bowel obstruction. There is no appreciable distal bowel gas. Continued surveillance evaluation recommended.   Electronically Signed   By: Margaree Mackintosh M.D.   On: 09/11/2013 10:14   Dg Abd Portable 1v  09/11/2013   CLINICAL DATA:  Nausea and vomiting  EXAM: PORTABLE ABDOMEN - 1 VIEW  COMPARISON:  09/09/2013  FINDINGS: Dilated small bowel, measuring up to 6 cm. Dilated structure in the central abdomen with lobulated mucosa remains dilated as well, the cecum on subsequent  abdominal CT. Nasogastric tube enters the collapsed stomach. Persistent Foley catheter and rectal tube.  IMPRESSION: Ongoing distal small bowel obstruction.   Electronically Signed   By: Jorje Guild M.D.   On: 09/11/2013 07:31        Scheduled Meds: . aspirin  325 mg Oral Daily  .  ceFAZolin (ANCEF) IV  1 g Intravenous 3 times per day  . enoxaparin (LOVENOX) injection  40 mg Subcutaneous QHS  . insulin aspart  0-9 Units Subcutaneous 4 times per day  . metoprolol  2.5 mg Intravenous 4 times per day  . metronidazole  500 mg Intravenous Q8H  . vancomycin  500 mg Per Tube 4 times per day   Continuous Infusions: . 0.9 % NaCl with KCl 40 mEq / L 75 mL/hr at 09/12/13 1520  . Marland KitchenTPN (CLINIMIX-E) Adult     And  . fat emulsion      Principal Problem:   Ileus Active Problems:   Malignant neoplasm of prostate   CVA (cerebral infarction)   Hypokalemia   C. difficile colitis   UTI (urinary tract infection)   Essential hypertension, benign    Time spent: 93 minutes    Asencion Loveday, MD, FACP, FHM. [facility] Pager [phone]  If 7PM-7AM, please contact night-coverage [email or web address] Password [email or web address] 09/12/2013, 3:41 PM    LOS: 2 days

## 2013-09-12 NOTE — Progress Notes (Signed)
Hydralazine administered for systolic bp >300.  Pt stated he felt hot after administration.

## 2013-09-12 NOTE — Progress Notes (Signed)
Patient interviewed and examined, agree with PA note above. Patient needs nutritional support. Discussed with the primary team. Edward Jolly MD, FACS  09/12/2013 6:01 PM

## 2013-09-12 NOTE — Progress Notes (Signed)
Rehab admissions - Pt known to [place] from previous rehab stay and was transferred from CIR to acute care on 09-11-13 due to medical issues. Noted that PT is now recommending CIR after pt's return to acute care on  09-11-13. Will follow pt's progress and medical status and proceed accordingly when appropriate.  Please call me with any questions. Thanks.  Nanetta Batty, PT Rehabilitation Admissions Coordinator [phone]

## 2013-09-12 NOTE — Progress Notes (Signed)
PARENTERAL NUTRITION CONSULT NOTE - INITIAL  Pharmacy Consult:  TPN Indication:  Prolonged ileus  Allergies  Allergen Reactions  . Dilaudid [Hydromorphone Hcl] Nausea And Vomiting    Pre pt history  . Oxycodone Nausea And Vomiting  . Procaine Hcl Other (See Comments)    Patient states he is allergic to novicaine  . Zofran [Ondansetron] Nausea And Vomiting  . Sulfa Antibiotics Rash    Patient Measurements: Height: 5\' 10"  (177.8 cm) Weight: 176 lb 5.9 oz (80 kg) IBW/kg (Calculated) : 73  Vital Signs: Temp: 98.5 F (36.9 C) (03/06 1330) Temp src: Oral (03/06 1330) BP: 158/87 mmHg (03/06 1330) Pulse Rate: 103 (03/06 1330) Intake/Output from previous day: 03/05 0701 - 03/06 0700 In: 1097.5 [I.V.:257.5; NG/GT:40; IV Piggyback:800] Out: 4680 [Urine:280; Emesis/NG output:4400] Intake/Output from this shift: Total I/O In: 450 [I.V.:450] Out: 200 [Emesis/NG output:200]  Labs:  Recent Labs  09/10/13 2325 09/11/13 0453 09/12/13 0530  WBC 7.8 8.1 9.1  HGB 9.8* 10.0* 10.1*  HCT 28.6* 29.6* 30.3*  PLT 308 336 332     Recent Labs  09/10/13 0625  09/10/13 2325 09/11/13 0453 09/11/13 2000 09/12/13 0530  NA 136*  < >  --  137 136* 140  K 2.4*  < >  --  2.9* 3.1* 3.2*  CL 94*  < >  --  95* 97 99  CO2 27  < >  --  26 23 27   GLUCOSE 139*  < >  --  95 89 93  BUN 19  < >  --  13 12 11   CREATININE 0.97  < > 0.94 0.90 0.84 0.84  CALCIUM 8.4  < >  --  8.5 8.4 8.7  MG 2.1  --  2.0  --   --   --   PROT  --   --   --  5.9*  --   --   ALBUMIN  --   --   --  2.3*  --   --   AST  --   --   --  30  --   --   ALT  --   --   --  20  --   --   ALKPHOS  --   --   --  47  --   --   BILITOT  --   --   --  0.3  --   --   < > = values in this interval not displayed. Estimated Creatinine Clearance: 102.6 ml/min (by C-G formula based on Cr of 0.84).    Recent Labs  09/11/13 2353 09/12/13 0608 09/12/13 1155  GLUCAP 89 103* 95    Medical History: Past Medical History   Diagnosis Date  . Alcoholic hepatitis   . Hypertension   . 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 Shortness of breath     new onset- occasionally-at rest and exertion  . GERD (gastroesophageal reflux disease)   . Anxiety   . TIA (transient ischemic attack) 08/2013  . Arthritis     " in my spine "  . Ileus 09/2013      Insulin Requirements in the past 24 hours:  None  Assessment: 57 YOM admitted on 08/29/13 for work-up of prostate cancer and underwent radical prostatectomy.  He was diagnosed with a new CVA post-op as well as right ICA aneurysm.  Discharged to Rehab on 09/06/13 and was later found to have an ileus requiring NGT for decompression.  Returned to the acute care services on 09/10/13 for further management.  Pharmacy consulted to start TPN due to prolonged ileus.  GI: hx GERD.  Returned to [facility] with ileus vs SBO - minimal intake during hospitalization, some concern of refeeding.  +C.diff colitis.  Significant NG O/P. Endo: no hx DM, A1c 5% - CBGs controlled Lytes: hypokalemia (6 runs ordered + IVF contains KCL), others WNL Renal: SCr 0.84, CrCL 103 ml/min, IVF at 75 ml/hr Cards: HTN - BP slightly elevated, tachycardic - IV Lopressor Hepatobil: EtOH hepatitis - LFTs WNL Neuro: anxiety / recent CVA - on ASA ID: Ancef for E.coli UTI + Flagyl/PO Vanc for +C.diff, afebrile, WBC WNL Best Practices: Lovenox TPN Access:  PICC placed 09/09/13 TPN day#: 0 (3/6 >> )  Current Nutrition:  NPO  Nutritional Goals:  1800-1900 kCal, 95-110 grams of protein per day   Plan:  - Initiate Clinimix E 5/15 at 30 ml/hr and lipids at 5 ml/hr - Daily IV multivitamin, folic acid, and thiamine in TPN - Trace elements on MWF only d/t national shortage - Start sensitive SSI Q6H.  D/C if CBGs remain controlled at goal TPN rate - Standard TPN labs    Darchelle Nunes D. Mina Marble, PharmD, BCPS Pager:  [phone] 09/12/2013, 1:56 PM

## 2013-09-12 NOTE — Progress Notes (Signed)
Patient ID: Tanner [name], male   DOB: March 27, 1956, 58 y.o.   MRN: [id number]    Subjective: Patient feels better today with NGT working properly.  Still passing lots from below  Objective: Vital signs in last 24 hours: Temp:  [98.7 F (37.1 C)-98.9 F (37.2 C)] 98.9 F (37.2 C) (03/06 0609) Pulse Rate:  [105-108] 107 (03/06 0609) Resp:  [18] 18 (03/06 0609) BP: (144-168)/(86-110) 151/104 mmHg (03/06 0609) SpO2:  [94 %-95 %] 95 % (03/06 0609) Weight:  [176 lb 5.9 oz (80 kg)] 176 lb 5.9 oz (80 kg) (03/06 0700) Last BM Date: 09/11/13  Intake/Output from previous day: 03/05 0701 - 03/06 0700 In: 1097.5 [I.V.:257.5; NG/GT:40; IV Piggyback:800] Out: 4680 [Urine:280; Emesis/NG output:4400] Intake/Output this shift:    PE: Abd: softer, still distended, but less so, few BS heard, NGT with some bloody tinged bilious output, mild diffuse tenderness  Lab Results:   Recent Labs  09/11/13 0453 09/12/13 0530  WBC 8.1 9.1  HGB 10.0* 10.1*  HCT 29.6* 30.3*  PLT 336 332   BMET  Recent Labs  09/11/13 2000 09/12/13 0530  NA 136* 140  K 3.1* 3.2*  CL 97 99  CO2 23 27  GLUCOSE 89 93  BUN 12 11  CREATININE 0.84 0.84  CALCIUM 8.4 8.7   PT/INR No results found for this basename: LABPROT, INR,  in the last 72 hours CMP     Component Value Date/Time   NA 140 09/12/2013 0530   K 3.2* 09/12/2013 0530   CL 99 09/12/2013 0530   CO2 27 09/12/2013 0530   GLUCOSE 93 09/12/2013 0530   BUN 11 09/12/2013 0530   CREATININE 0.84 09/12/2013 0530   CALCIUM 8.7 09/12/2013 0530   PROT 5.9* 09/11/2013 0453   ALBUMIN 2.3* 09/11/2013 0453   AST 30 09/11/2013 0453   ALT 20 09/11/2013 0453   ALKPHOS 47 09/11/2013 0453   BILITOT 0.3 09/11/2013 0453   GFRNONAA >90 09/12/2013 0530   GFRAA >90 09/12/2013 0530   Lipase     Component Value Date/Time   LIPASE 14 09/06/2013 1040       Studies/Results: Dg Abd 1 View  09/12/2013   CLINICAL DATA:  58 year old male requiring cystogram. Evaluation of residual oral contrast.  Recent small bowel obstruction. Initial encounter.  EXAM: ABDOMEN - 1 VIEW  COMPARISON:  09/11/2013 and earlier.  FINDINGS: Rectal or bladder catheter remains in place projecting over the lower pelvis. There is a small volume of retained oral contrast in the distal colon, in the pelvis.  Continued dilated gas-filled small bowel loops up to 60 mm diameter, not significantly changed. Scattered abdominal skin staples re- identified. Enteric tube no longer visible. Stable visualized osseous structures.  IMPRESSION: 1. Small volume retained enteric contrast in the rectosigmoid colon. 2. Stable small bowel obstruction gas pattern.   Electronically Signed   By: Lars Pinks M.D.   On: 09/12/2013 08:26   Ct Abdomen Pelvis W Contrast  09/11/2013   CLINICAL DATA:  Ileus versus small-bowel obstruction  EXAM: CT ABDOMEN AND PELVIS WITH CONTRAST  TECHNIQUE: Multidetector CT imaging of the abdomen and pelvis was performed using the standard protocol following bolus administration of intravenous contrast.  CONTRAST:  1mL OMNIPAQUE IOHEXOL 300 MG/ML  SOLN  COMPARISON:  DG ABD PORTABLE 1V dated 09/10/2013  FINDINGS: Lung bases are clear.  Pericardial fluid.  As NG tube in stomach. Oral contrast into the stomach. The duodenum is minimally dilated. The jejunum is dilated up to 5  cm. Oral contrast progresses to the mid small bowel. The distal small bowel isfluid-filled up to 3.6 cm in the right lower quadrant. There is poor progression of the oral contrast into the distal small bowel. Within the most distal small bowel, leading up to the terminal ileum, the small bowel is collapsed measuring 15 mm. The cecum is anterior medial in the abdomen. Potential caliber change in the small bowel in the right lower quadrant along the abdominal wall. No evidence of pneumatosis or [name] venous gas.  Liver is normal. The pancreas, spleen, adrenal glands, and kidneys are normal.  No retroperitoneal periportal lymphadenopathy. There is a Foley catheter  in the bladder. Rectal Tube within the rectum. No pelvic lymphadenopathy. Patient status post prostatectomy. No aggressive osseous lesion.  IMPRESSION: 1. Findings are most suggestive of mechanical small bowel obstruction with transition point in the distal small bowel of the right lower quadrant. No obstructing lesion identified. Query prior abdominal surgery. 2. No evidence of [name] venous gas or intraperitoneal free air. 3. Findings similar to plain film findings. This was made a call report.   Electronically Signed   By: Suzy Bouchard M.D.   On: 09/11/2013 08:11   Dg Chest Port 1 View  09/10/2013   CLINICAL DATA:  Cough check for infiltrates.  EXAM: PORTABLE CHEST - 1 VIEW  COMPARISON:  DG CHEST 2 VIEW dated 09/06/2013  FINDINGS: The heart size and mediastinal contours are within normal limits. Both lungs are clear. The visualized skeletal structures are unremarkable. An NG tube is appreciated with tip projecting in the region of the stomach. A right-sided central venous catheter via a subclavian approach is identified tip projecting region superior vena caval right atrial junction. The aorta is tortuous.  IMPRESSION: No active disease.   Electronically Signed   By: Margaree Mackintosh M.D.   On: 09/10/2013 22:05   Dg Abd Portable 1v  09/11/2013   CLINICAL DATA:  Evaluate for small bowel obstruction  EXAM: PORTABLE ABDOMEN - 1 VIEW  COMPARISON:  CT ABD/PELVIS W CM dated 09/11/2013  FINDINGS: Air is appreciated within multiple dilated loops small bowel. There is a paucity of distal bowel gas. In NG tube projects in the region of a gaseous distended stomach. There is mild levoscoliosis of thoracolumbar spine. Surgical clips project right lower quadrant of the abdomen.  IMPRESSION: Findings consistent with small-bowel obstruction. There is no appreciable distal bowel gas. Continued surveillance evaluation recommended.   Electronically Signed   By: Margaree Mackintosh M.D.   On: 09/11/2013 10:14   Dg Abd Portable  1v  09/11/2013   CLINICAL DATA:  Nausea and vomiting  EXAM: PORTABLE ABDOMEN - 1 VIEW  COMPARISON:  09/09/2013  FINDINGS: Dilated small bowel, measuring up to 6 cm. Dilated structure in the central abdomen with lobulated mucosa remains dilated as well, the cecum on subsequent abdominal CT. Nasogastric tube enters the collapsed stomach. Persistent Foley catheter and rectal tube.  IMPRESSION: Ongoing distal small bowel obstruction.   Electronically Signed   By: Jorje Guild M.D.   On: 09/11/2013 07:31    Anti-infectives: Anti-infectives   Start     Dose/Rate Route Frequency Ordered Stop   09/11/13 2200  ceFAZolin (ANCEF) IVPB 1 g/50 mL premix     1 g 100 mL/hr over 30 Minutes Intravenous 3 times per day 09/11/13 1758     09/11/13 1200  vancomycin (VANCOCIN) 50 mg/mL oral solution 500 mg     500 mg Per Tube 4 times per day  09/11/13 1159     09/11/13 1000  metroNIDAZOLE (FLAGYL) IVPB 500 mg     500 mg 100 mL/hr over 60 Minutes Intravenous Every 8 hours 09/11/13 0932     09/10/13 2230  cefTRIAXone (ROCEPHIN) 1 g in dextrose 5 % 50 mL IVPB  Status:  Discontinued     1 g 100 mL/hr over 30 Minutes Intravenous Every 24 hours 09/10/13 2145 09/11/13 1758       Assessment/Plan  1. Ileus, contrast is in his colon of films today 2. S/p prostatectomy 3. CVA with right sided hemiparesis 4. Hypokalemia 5. c diff colitis  Plan: 1. Cont current conservative management 2. Agree consideration of picc and TNA given duration of essentially NPO state for malnutrition 3. Cont to replace K 4. Mobilize as able 5. Will follow.  No surgical intervention at this time.    LOS: 2 days    Taten Merrow E 09/12/2013, 9:41 AM Pager: [phone]

## 2013-09-12 NOTE — Progress Notes (Addendum)
Pt and his sister has declined to have sizewize bed changed out, to repair bed exit alarm problem, pt states am not going to get out of this bed, without someone helping me, his wife at his bedside at states the same Sherlynn Stalls, RN

## 2013-09-12 NOTE — Progress Notes (Signed)
Daily Rounding Note  09/12/2013, 1:50 PM  LOS: 2 days   SUBJECTIVE:       NGT output 4.4 liters yesterday, 1.1 liter 2 days ago.  200 cc so far today. Not measuring the flexiseal output.  Feels better.  Denies pain and N/V  OBJECTIVE:         Vital signs in last 24 hours:    Temp:  [98.5 F (36.9 C)-98.9 F (37.2 C)] 98.5 F (36.9 C) (03/06 1330) Pulse Rate:  [103-123] 103 (03/06 1330) Resp:  [18] 18 (03/06 1330) BP: (144-168)/(87-110) 158/87 mmHg (03/06 1330) SpO2:  [95 %-98 %] 98 % (03/06 1330) Weight:  [80 kg (176 lb 5.9 oz)] 80 kg (176 lb 5.9 oz) (03/06 0700) Last BM Date: 09/11/13 General: looks unwell, comfortable   Heart: RRR Chest: clear bil. Abdomen: NGT output amber colored.  Soft, not tender, BS hypoactive. Still distended though pt and wife say this is better.  Dark but not bloody, watery, rectal tube output.  Extremities: no CCE Neuro/Psych:  Pleasant, conversant, oriented x 3.    Intake/Output from previous day: 03/05 0701 - 03/06 0700 In: 1097.5 [I.V.:257.5; NG/GT:40; IV Piggyback:800] Out: 4680 [Urine:280; Emesis/NG output:4400]  Intake/Output this shift: Total I/O In: 450 [I.V.:450] Out: 200 [Emesis/NG output:200]  Lab Results:  Recent Labs  09/10/13 2325 09/11/13 0453 09/12/13 0530  WBC 7.8 8.1 9.1  HGB 9.8* 10.0* 10.1*  HCT 28.6* 29.6* 30.3*  PLT 308 336 332   BMET  Recent Labs  09/11/13 0453 09/11/13 2000 09/12/13 0530  NA 137 136* 140  K 2.9* 3.1* 3.2*  CL 95* 97 99  CO2 26 23 27   GLUCOSE 95 89 93  BUN 13 12 11   CREATININE 0.90 0.84 0.84  CALCIUM 8.5 8.4 8.7   LFT  Recent Labs  09/11/13 0453  PROT 5.9*  ALBUMIN 2.3*  AST 30  ALT 20  ALKPHOS 47  BILITOT 0.3    Studies/Results: Dg Abd 1 View 09/12/2013     FINDINGS: Rectal or bladder catheter remains in place projecting over the lower pelvis. There is a small volume of retained oral contrast in the distal  colon, in the pelvis.  Continued dilated gas-filled small bowel loops up to 60 mm diameter, not significantly changed. Scattered abdominal skin staples re- identified. Enteric tube no longer visible. Stable visualized osseous structures.  IMPRESSION: 1. Small volume retained enteric contrast in the rectosigmoid colon. 2. Stable small bowel obstruction gas pattern.   Electronically Signed   By: Lars Pinks M.D.   On: 09/12/2013 08:26   Ct Abdomen Pelvis W Contrast 09/11/2013  FINDINGS: Lung bases are clear.  Pericardial fluid.  As NG tube in stomach. Oral contrast into the stomach. The duodenum is minimally dilated. The jejunum is dilated up to 5 cm. Oral contrast progresses to the mid small bowel. The distal small bowel isfluid-filled up to 3.6 cm in the right lower quadrant. There is poor progression of the oral contrast into the distal small bowel. Within the most distal small bowel, leading up to the terminal ileum, the small bowel is collapsed measuring 15 mm. The cecum is anterior medial in the abdomen. Potential caliber change in the small bowel in the right lower quadrant along the abdominal wall. No evidence of pneumatosis or portal venous gas.  Liver is normal. The pancreas, spleen, adrenal glands, and kidneys are normal.  No retroperitoneal periportal lymphadenopathy. There is a Foley catheter in  the bladder. Rectal Tube within the rectum. No pelvic lymphadenopathy. Patient status post prostatectomy. No aggressive osseous lesion.  IMPRESSION: 1. Findings are most suggestive of mechanical small bowel obstruction with transition point in the distal small bowel of the right lower quadrant. No obstructing lesion identified. Query prior abdominal surgery. 2. No evidence of portal venous gas or intraperitoneal free air. 3. Findings similar to plain film findings. This was made a call report.   Electronically Signed   By: Suzy Bouchard M.D.   On: 09/11/2013 08:11      ASSESMENT:   *  Ileus vs SBO    Persistent hypokalemia. Flexiseal in place.   *  C diff on IV Flagyl day 2, po Vanc day.    *  E coli UTI.  Day 2 Cefazolin.  *  S/p prostatectomy 08/29/2013  *  Post op CVA. Right hemiparesis.   *  Normocytic anemia.    PLAN   *  On po vancomycin, IV metronidazole.  Continue these for at least one week following treatment with Cephalosporin.  *  Leave NGT and flexiseal in place. KUB in AM.     Azucena Freed  09/12/2013, 1:50 PM Pager: [phone]  GI ATTENDING  Interval history and data reviewed. Patient seen and examined. Daughter in room. Feeling better. Looks better. Abdomen less distended and bowel sounds improved. Continue with current therapies without change. Followup x-ray in a.m.  Docia Chuck. Geri Seminole., M.D. [facility] Division of Gastroenterology

## 2013-09-12 NOTE — Progress Notes (Signed)
Urology Progress Note  Subjective:     No acute urologic events overnight. Still w/ abdominal distention. Still with diarrhea.  Distention seems slightly improved.  No abdominal pain.  ROS: Positive: RUE/RLE weakness.  Negative: Chest pain.  Objective:  Patient Vitals for the past 24 hrs:  BP Temp Temp src Pulse Resp SpO2  09/12/13 0609 151/104 mmHg 98.9 F (37.2 C) Oral 107 18 95 %  09/11/13 1944 154/99 mmHg - - 108 18 95 %  09/11/13 1900 144/91 mmHg - - 105 - -  09/11/13 1826 168/110 mmHg - - 108 - -  09/11/13 1332 151/86 mmHg 98.7 F (37.1 C) Oral 105 18 94 %  09/11/13 1100 162/100 mmHg - - - - -    Physical Exam: General:  No acute distress, awake Cardiovascular:    [x]   S1/S2 present, Mildly tachycardic.  []   Irregularly irregular Chest:  CTA-B Abdomen:               []  Soft, appropriately TTP  [x]  Soft, Distended. No rebound TTP. Incisions c/d/i without erythema.  []  Soft, appropriately TTP, incision(s) clean/dry/intact  Genitourinary: Foley in place. Clear urine. Negative edema.     I/O last 3 completed shifts: In: 1097.5 [I.V.:257.5; NG/GT:40; IV Piggyback:800] Out: 38 [Urine:280; Emesis/NG output:5500]  Recent Labs     09/11/13  0453  09/12/13  0530  HGB  10.0*  10.1*  WBC  8.1  9.1  PLT  336  332    Recent Labs     09/11/13  2000  09/12/13  0530  NA  136*  140  K  3.1*  3.2*  CL  97  99  CO2  23  27  BUN  12  11  CREATININE  0.84  0.84  CALCIUM  8.4  8.7  GFRNONAA  >90  >90  GFRAA  >90  >90     No results found for this basename: PT, INR, APTT,  in the last 72 hours   No components found with this basename: ABG,     Length of stay: 2 days.  Assessment: UTI. C. Diff colitis.  Robotic radical prostatectomy 08/29/13.   Plan: -Continue conservative management for bowels.   -D/c staples today.  -Plan for foley: KUB daily until PO contrast clears the bowel. After contrast clears, will order plain film cystogram to ensure no urine  leak before d/c foley.   -Continue IV ancef for UTI.  -Will follow.   Rolan Bucco, MD [phone]

## 2013-09-12 NOTE — Evaluation (Signed)
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 Now transfered back to acute for ileus vs SBO.   Clinical Impression   Pt requiring assist of 2 for all mobility. Presents with R side weakness, decreased balance and impaired activity tolerance interfering with ability to perform ADL and ADL transfers.  Will need intense rehab when acute medical issues are resolved.    OT Assessment  Patient needs continued OT Services    Follow Up Recommendations  CIR    Barriers to Discharge Inaccessible home environment    Equipment Recommendations  3 in 1 bedside comode    Recommendations for Other Services    Frequency  Min 3X/week    Precautions / Restrictions Precautions Precautions: Fall Precaution Comments: NG, Foley, rectal tube and IV; monitor vitals, air mattress overlay SR x 4 Restrictions Weight Bearing Restrictions: No   Pertinent Vitals/Pain See flow sheet for vitals, chronic back/neck pain with scoliosis   ADL  Eating/Feeding: Set up (ice chips with L hand) Where Assessed - Eating/Feeding: Chair Grooming: Wash/dry hands;Wash/dry face;Minimal assistance (NG tube interfering) Where Assessed - Grooming: Supported sitting Upper Body Bathing: Moderate assistance Where Assessed - Upper Body Bathing: Unsupported sitting Lower Body Bathing: +2 Total assistance Where Assessed - Lower Body Bathing: Supported sit to stand Upper Body Dressing: Moderate assistance Where Assessed - Upper Body Dressing: Supine, head of bed up Lower Body Dressing: +2 Total assistance Where Assessed - Lower Body Dressing: Supported sit to stand Toilet Transfer:  +2 Total assistance;Minimal assistance Toilet Transfer Method: Sit to Loss adjuster, chartered:  (to recliner) Toileting - Clothing Manipulation and Hygiene: +2 Total assistance Where Assessed - Toileting Clothing Manipulation and Hygiene: Standing Transfers/Ambulation Related to ADLs: sat EOB x 10 minutes with min guard assist, initially dizzy    OT Diagnosis: Generalized weakness;Hemiplegia dominant side  OT Problem List: Decreased strength;Decreased activity tolerance;Impaired balance (sitting and/or standing);Decreased coordination;Impaired UE functional use;Pain;Impaired sensation OT Treatment Interventions: Self-care/ADL training;Therapeutic exercise;Neuromuscular education;Therapeutic activities;Patient/family education;Balance training   OT Goals(Current goals can be found in the care plan section) Acute Rehab OT Goals Patient Stated Goal: recover and get back to being independent Time For Goal Achievement: 09/26/13 Potential to Achieve Goals: Good ADL Goals Pt Will Perform Eating: with modified independence;sitting;with adaptive utensils (with R hand leading) Pt Will Perform Grooming: with min assist;standing Pt Will Perform Upper Body Bathing: with min assist;sitting Pt Will Perform Upper Body Dressing: with min assist;sitting Pt Will Transfer to Toilet: with min assist;ambulating;bedside commode Pt Will Perform Toileting - Clothing Manipulation and hygiene: with min assist;sit to/from stand Pt/caregiver will Perform Home Exercise Program: Increased ROM;Increased strength;Right Upper extremity;Independently  Visit Information  Last OT Received On: 09/12/13 Assistance Needed: +2 (multiple lines) PT/OT/SLP Co-Evaluation/Treatment: Yes Reason for Co-Treatment: Complexity of the patient's impairments (multi-system involvement) OT goals addressed during session: ADL's and self-care History of Present Illness: 58 yo male 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 Now transfered back to acute for ileus vs SBO.       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  Communication Communication: No difficulties Dominant Hand: Right         Vision/Perception Vision - History Baseline Vision: Wears glasses only for reading Visual History: Glaucoma Patient Visual Report: No change from baseline   Cognition  Cognition Arousal/Alertness: Awake/alert Behavior During Therapy: WFL for tasks assessed/performed Overall Cognitive Status: Within Functional Limits for tasks assessed    Extremity/Trunk Assessment Upper Extremity Assessment Upper Extremity Assessment: RUE deficits/detail RUE Deficits / Details: Pt with incoordination, full PROM of shoulder, but 30 FF actively, full AROM of elbow, fingers and forearm.  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 R UE is a functional assist, but not lead extremity. RUE Coordination: decreased fine motor;decreased gross motor Lower Extremity Assessment Lower Extremity Assessment: Defer to PT evaluation Cervical / Trunk Assessment Cervical / Trunk Assessment: Normal (complains of neck pain with HOB lowered)     Mobility Bed Mobility Overal bed mobility: Needs Assistance Bed Mobility: Rolling;Sidelying to Sit Rolling: Mod assist Sidelying to sit: +2 for physical assistance;Mod assist Transfers Overall transfer level: Needs  assistance Transfers: Stand Pivot Transfers Sit to Stand: +2 physical assistance;Min assist Stand pivot transfers: +2 physical assistance;Min assist     Exercise     Balance Balance Overall balance assessment: Needs assistance Sitting-balance support: Feet supported Sitting balance-Leahy Scale: Fair   End of Session OT - End of Session Activity Tolerance: Patient tolerated treatment well Patient left: in chair;with call bell/phone within reach  GO     Malka So 09/12/2013, 10:18 AM [phone]

## 2013-09-12 NOTE — Care Management Note (Signed)
    Page 1 of 2   09/30/2013     3:16:06 PM   CARE MANAGEMENT NOTE 09/30/2013  Patient:  Tanner [name], Tanner [name]   Account Number:  [id number]  Date Initiated:  09/12/2013  Documentation initiated by:  Fidencio Duddy  Subjective/Objective Assessment:   PT S/P RECENT PROSTATECTOMY WITH POST OP CVA; NOW WITH SBO VS ILEUS AND HYPOKALEMIA.  PT WAS ADMITTED FROM [facility].     Action/Plan:   WILL FOLLOW FOR DC NEEDS AS PT PROGRESSES.  PT/OT RECOMMENDING RETURN TO CIR WHEN MEDICAL ISSUES RESOLVED.   Anticipated DC Date:  09/30/2013   Anticipated DC Plan:  [place]  In-house referral  Clinical Social Worker      DC Planning Services  CM consult      [facility] Choice  HOME HEALTH   Choice offered to / List presented to:  C-1 Patient        [place] arranged  HH-1 RN  [place].   Status of service:  Completed, signed off Medicare Important Message given?   (If response is "NO", the following Medicare IM given date fields will be blank) Date Medicare IM given:   Date Additional Medicare IM given:    Discharge Disposition:  [place]  Per UR Regulation:  Reviewed for med. necessity/level of care/duration of stay  If discussed at [place] of Stay Meetings, dates discussed:   09/16/2013  09/18/2013  09/23/2013  09/25/2013  09/30/2013    Comments:  09/29/13 Almyra Free Corona Popovich,RN,BSN [phone] PT CHOOSES [place]; REFERRAL TO [facility]. START OF CARE 24-48H POST DC DATE.  PT STATES SISTER AND DAUGHTER TO PROVIDE INTERMITTENT SUPERVISION AT DC.  09/26/13 Nicha Hemann,RN,BSN [phone] PT HAS PROGRESSED WELL WITH THERAPY; P.T. FEELS PT SAFE TO GO HOME AT DC WITH HH FOLLOW UP.  CIR DECLINING PT RETURN, AND PT WISHES TO GO HOME.  MET WITH PT AND HIS DAUGHTER TO DISCUSS DC PLANS.  PT STATES HE HAS 2 DAUGHTERS AND 2 SISTERS WHO WILL ASSIST WITH CARE AT DC.  P.T. RECOMMENDING 24HR  SUPERVISION; PT STATES THIS HAS NOT YET BEEN WORKED OUT WITH FAMILY.  HE DOES NOT DESIRE SNF.  GAVE PT LIST OF [place] HH PROVIDERS TO DISCUSS WITH FAMILY, AND ENCOURAGED PT TO WORK OUT DC PLANS WITH FAMILY OVER THE WEEKEND.  WILL FOLLOW UP WITH PT ON MONDAY WITH HH CHOICE. PT STATES HE HAS RW, BSC, AND TUB SEAT-(THOUGH IT DOES NOT FIT HIS TUB)...OT-WOULD TUB BENCH BE A BETTER ALTERNATIVE? PLEASE ADVISE.  THANKS.  09/18/13 Malaky Tetrault,RN,BSN [phone] PT WITH CONT SBO/ILEUS REQUIRING NG/TNA.  SURGERY CONT TO FOLLOW PROGRESS.  DC PLAN IS TO RETURN TO [facility] WHEN MEDICALLY STABLE.  WILL FOLLOW PROGRESS.

## 2013-09-12 NOTE — Discharge Summary (Signed)
Tanner [name], Tanner [name]                ACCOUNT NO.:  [id number]  MEDICAL RECORD NO.:  [id number]  LOCATION:  4M01C                        FACILITY:  [facility]  PHYSICIAN:  [name], M.D.DATE OF BIRTH:  1956/03/25  DATE OF ADMISSION:  09/05/2013 DATE OF DISCHARGE:  09/10/2013                              DISCHARGE SUMMARY   DISCHARGE DIAGNOSES: 1. Thrombotic left corona radiata infarction. 2. Subcutaneous heparin for deep venous thrombosis prophylaxis. 3. Pain management. 4. Persistent ileus. 5. Robotic radical prostatectomy on August 29, 2013. 6. Hypertension. 7. 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  A 58 year old right-handed male with history of hypertension, alcoholic hepatitis, who was admitted on August 29, 2013, for workup of recent prostate cancer followed by Urology Services and underwent robotic- assisted laparoscopic radical prostatectomy on August 29, 2013, per Dr. Jasmine December.  Present with new onset of right-sided weakness.  MRI of the brain showed acute nonhemorrhagic infarct extending from posterior left lenticular nucleus to left corona radiata.  Incidental findings of right ICA aneurysm, planned workup as outpatient.  Echocardiogram with ejection fraction of 65%.  No wall motion abnormalities.  Carotid Dopplers with no ICA stenosis.  The patient did not receive tPA. Maintained on aspirin therapy for CVA prophylaxis as well as subcutaneous heparin.  Tolerating a regular diet.  Follow up Urology Services with Foley catheter tube in place.  Physical and Occupational Therapy ongoing.  The patient was admitted for comprehensive rehab program.  PAST MEDICAL HISTORY:  See discharge diagnoses.  SOCIAL HISTORY:  Lives alone.  FUNCTIONAL HISTORY:  Prior to admission, independent.  FUNCTIONAL STATUS:  Upon admission to [facility], was ambulating 34 feet with decreased gait velocity using a walker, +2 total assist for lower body  dressing as well as bathing.  PHYSICAL EXAMINATION:  VITAL SIGNS:  Blood pressure 118/71, pulse 87, temperature 98.6. GENERAL:  This was an alert male, oriented x3. HEENT:  Pupils round and reactive to light. LUNGS:  Clear to auscultation. ABDOMEN:  Mildly distended, positive bowel sounds, nontender. CARDIAC:  Regular rate and rhythm.  REHABILITATION HOSPITAL COURSE:  The patient was admitted to Inpatient Rehab Services with therapies initiated on a 3-hour daily basis consisting of physical therapy, occupational therapy, and rehabilitation nursing.  The following issues were addressed during the patient's rehabilitation stay.  Pertaining to Mr. Tanner [name]'s thrombotic left CVA remained stable, maintained on aspirin therapy.  Subcutaneous heparin for DVT prophylaxis.  He had undergone recent robotic radical prostatectomy per Urology Services.  Foley catheter tube in place. Surgical site healing nicely.  Blood pressures monitored.  Noted upon admission to [facility], the patient developed some increased nausea and vomiting.  Abdominal film showed ileus.  His diet was downgraded to clear liquids.  Gastroenterology Service had been consulted, placed on intravenous Reglan.  A rectal tube was inserted and later, a nasogastric tube for decompression.  Persistent bouts of hypokalemia with runs of potassium, potassium levels vary 2.4 to 3.5.  Ongoing bouts of nausea complicated by his bouts of hypokalemia with therapies limit.  Due to two medical complications, it was felt that the patient should be discharge to acute care services to acknowledge all medical needs, which took  place on September 10, 2013.  The patient's condition at time of discharge was guarded.  All issues were discussed with family.     Lauraine Rinne, P.A.   ______________________________ [name], M.D.    DA/MEDQ  D:  09/11/2013  T:  09/12/2013  Job:  [id number]

## 2013-09-12 NOTE — Progress Notes (Addendum)
Notified MD of potassium 3.4; MD orders 4 runs of K if potassium is not included in the TPN; called Pharmacy pharmacy verified potassium is included in TPN.  Carollee Sires, RN

## 2013-09-12 NOTE — Progress Notes (Signed)
INITIAL NUTRITION ASSESSMENT  DOCUMENTATION CODES Per approved criteria  -Severe malnutrition in the context of acute illness or injury   INTERVENTION:  TPN per pharmacy  Monitor Mg, Phos, K+ levels RD to follow for nutrition care plan  NUTRITION DIAGNOSIS: Inadequate oral intake related to altered GI function as evidenced by NPO status  Goal: Pt to meet >/= 90% of their estimated nutrition needs  Monitor:  TPN prescription, PO diet advancement & intake, weight, labs, I/O's  Reason for Assessment: Malnutrition Screening Tool Report, Consult  59 y.o. male  Admitting Dx: Ileus  ASSESSMENT: 58 y.o. male with PMH of prostate cancer and hypertension who has had recent prostatectomy following which patient had developed right-sided hemiplegia and was found to have acute CVA and was eventually transferred to rehabilitation where patient started developing postoperative ileus. Gastroenterologist was consulted and patient's narcotics were stopped and patient was placed on NG tube and rectal tube.   Patient also was started on IV Reglan and was also found to have hypokalemia. Patient also was found to be febrile and urine cultures Escherichia coli. Since patient's status did not improve and needed further acute care patient was transferred to [facility] cone from the rehabilitation from fourth floor. Patient on my exam denies any chest pain shortness of breath. Has distended abdomen. Has no abdominal pain at this time. Denies any fever chills. Is alert awake oriented.   RD spoke with patient at bedside.  Reports he hasn't had any solid food x 2 weeks.  Has only consumed jello during time frame.  + 6% weight loss x 1 week.  NGT in place to LIS.  3/5 CT ABD/PELVIS consistent with SBO.    RD consulted for new TPN.  Patient is receiving TPN with Clinimix E 5/15 @ 30 ml/hr and lipids @ 5 ml/hr. Provides 751 kcal and 36 grams protein per day. Meets 34% minimum estimated energy needs and 33% minimum  estimated protein needs.  Patient meets criteria for severe malnutrition in the context of acute illness or injury as evidenced by < 50% intake of estimated energy requirement for > 5 days and 6% weight loss x 1 week.  Patient at risk for refeeding syndrome.  Height: Ht Readings from Last 1 Encounters:  09/10/13 5\' 10"  (1.778 m)    Weight: Wt Readings from Last 1 Encounters:  09/12/13 176 lb 5.9 oz (80 kg)    Ideal Body Weight: 166 lb  % Ideal Body Weight: 106%  Wt Readings from Last 10 Encounters:  09/12/13 176 lb 5.9 oz (80 kg)  09/05/13 187 lb 13.3 oz (85.2 kg)  08/29/13 184 lb 15.5 oz (83.9 kg)  08/29/13 184 lb 15.5 oz (83.9 kg)  08/25/13 185 lb (83.915 kg)  07/24/13 184 lb 11.2 oz (83.779 kg)  12/10/12 185 lb (83.915 kg)  12/10/12 185 lb (83.915 kg)  12/16/11 170 lb (77.111 kg)    Usual Body Weight: 187 lb  % Usual Body Weight: 94%  BMI:  Body mass index is 25.31 kg/(m^2).  Estimated Nutritional Needs: Kcal: 2200-2400 Protein: 110-120 gm Fluid: 2.0-2.4 L  Skin: Intact  Diet Order: NPO  EDUCATION NEEDS: -No education needs identified at this time   Intake/Output Summary (Last 24 hours) at 09/12/13 1452 Last data filed at 09/12/13 1440  Gross per 24 hour  Intake 2257.5 ml  Output   3530 ml  Net -1272.5 ml    Labs:   Recent Labs Lab 09/09/13 0500 09/10/13 0625  09/10/13 2325 09/11/13 0453 09/11/13  2000 09/12/13 0530  NA 136* 136*  < >  --  137 136* 140  K 2.7* 2.4*  < >  --  2.9* 3.1* 3.2*  CL 91* 94*  < >  --  95* 97 99  CO2 25 27  < >  --  26 23 27   BUN 22 19  < >  --  13 12 11   CREATININE 1.28 0.97  < > 0.94 0.90 0.84 0.84  CALCIUM 8.7 8.4  < >  --  8.5 8.4 8.7  MG  --  2.1  --  2.0  --   --   --   GLUCOSE 128* 139*  < >  --  95 89 93  < > = values in this interval not displayed.  CBG (last 3)   Recent Labs  09/11/13 2353 09/12/13 0608 09/12/13 1155  GLUCAP 89 103* 95    Scheduled Meds: . aspirin  325 mg Oral Daily  .   ceFAZolin (ANCEF) IV  1 g Intravenous 3 times per day  . enoxaparin (LOVENOX) injection  40 mg Subcutaneous QHS  . insulin aspart  0-9 Units Subcutaneous 4 times per day  . metoprolol  2.5 mg Intravenous 4 times per day  . metronidazole  500 mg Intravenous Q8H  . vancomycin  500 mg Per Tube 4 times per day    Continuous Infusions: . 0.9 % NaCl with KCl 40 mEq / L 75 mL/hr at 09/12/13 1440  . Marland KitchenTPN (CLINIMIX-E) Adult     And  . fat emulsion      Past Medical History  Diagnosis Date  . Alcoholic hepatitis   . Hypertension   . 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 Shortness of breath     new onset- occasionally-at rest and exertion  . GERD (gastroesophageal reflux disease)   . Anxiety   . TIA (transient ischemic attack) 08/2013  . Arthritis     " in my spine "  . Ileus 09/2013    Past Surgical History  Procedure Laterality Date  . Hand surgery Right   . Hernia repair    . Orif ankle fracture Right 12/10/2012    Procedure: OPEN REDUCTION INTERNAL FIXATION (ORIF) ANKLE FRACTURE;  Surgeon: Hessie Dibble, MD;  Location: [facility];  Service: Orthopedics;  Laterality: Right;  . Robot assisted laparoscopic radical prostatectomy N/A 08/29/2013    Procedure: ROBOTIC ASSISTED LAPAROSCOPIC RADICAL PROSTATECTOMY LEVEL 2, Lysis of adhesions;  Surgeon: Molli Hazard, MD;  Location: [facility];  Service: Urology;  Laterality: N/A;  . Lymphadenectomy Bilateral 08/29/2013    Procedure: LYMPHADENECTOMY "BILATERAL PELVIC LYMPH NODE DISSECTION";  Surgeon: Molli Hazard, MD;  Location: [facility];  Service: Urology;  Laterality: Bilateral;    Arthur Holms, RD, LDN Pager #: [phone] After-Hours Pager #: [phone]

## 2013-09-12 NOTE — Progress Notes (Signed)
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 Now transfered back to acute for ileus vs SBO.   PT Comments   Pt limited by weakness and fatigue due to not eating and fatigue.  Follow Up Recommendations  CIR     Does the patient have the potential to tolerate intense rehabilitation     Barriers to Discharge        Equipment Recommendations       Recommendations for Other Services    Frequency Min 4X/week   Progress towards PT Goals Progress towards PT goals: Progressing toward goals  Plan Current plan remains appropriate    Precautions / Restrictions Precautions Precautions: Fall Precaution Comments: NG, Foley, rectal tube and IV; monitor vitals, air mattress overlay SR x 4 Restrictions Weight Bearing Restrictions: No   Pertinent Vitals/Pain Neck pain , BP 152/91 and HR 123 with mobility    Mobility  Bed Mobility Overal bed mobility: Needs Assistance Bed Mobility: Rolling;Sidelying to Sit Rolling: Mod assist Sidelying to sit: +2 for physical assistance;Mod assist General bed mobility comments:  (A to get legs off of bed and to get trunk upright) Transfers Overall transfer level: Needs assistance Equipment used: 2 person hand held assist Transfers: Sit to/from Stand;Stand Pivot Transfers Sit to Stand: +2 physical assistance;Min assist Stand pivot transfers: +2 physical assistance;Min assist General transfer comment: Steadying assistance given and pt had difficulty moving R LE due to buckeling Modified Rankin (Stroke Patients Only) Pre-Morbid Rankin Score: No symptoms Modified Rankin: Moderately severe disability     Exercises General Exercises - Lower Extremity Gluteal Sets: Strengthening;10 reps;Supine Short Arc Quad: AROM;Right;10 reps;Supine Hip ABduction/ADduction: AROM;Right;10 reps;Supine Straight Leg Raises: Strengthening;5 reps;Right;Supine   PT Diagnosis:    PT Problem List:   PT Treatment Interventions:     PT Goals (current goals can now be found in the care plan section) Acute Rehab PT Goals Patient Stated Goal: recover and get back to being independent  Visit Information  Last PT Received On: 09/12/13 Assistance Needed: +2 PT/OT/SLP Co-Evaluation/Treatment: Yes Reason for Co-Treatment: Complexity of the patient's impairments (multi-system involvement) PT goals addressed during session: Mobility/safety with mobility;Balance;Strengthening/ROM OT goals addressed during session: ADL's and self-care History of Present Illness: 59 yo male s/p prostatectomy 2/20 for malignant neoplasm and 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 Now transfered back to acute for ileus vs SBO.    Subjective Data  Subjective: Pt supine in bed upon arrival with NG tube, catheter, rectal tube, and IV Patient Stated Goal: recover and get back to being independent   Cognition  Cognition Arousal/Alertness: Awake/alert Behavior During Therapy: WFL for tasks assessed/performed Overall Cognitive Status: Within Functional Limits for tasks assessed    Balance  Balance Overall balance assessment: Needs assistance Sitting-balance support: Feet supported Sitting balance-Leahy Scale: Fair Standing balance support: Bilateral upper extremity supported Standing balance-Leahy Scale: Fair General Comments General comments (skin integrity, edema, etc.): Pt nervous about tubes with mobility.  End of Session PT - End of Session Activity Tolerance: Patient tolerated treatment well;Patient limited by fatigue Patient left: in chair;with call bell/phone within  reach Nurse Communication: Mobility status   GP     Tanner [facility] [name]  09/12/2013, 10:28 AM

## 2013-09-13 ENCOUNTER — Inpatient Hospital Stay (HOSPITAL_COMMUNITY): Payer: Medicaid Other

## 2013-09-13 DIAGNOSIS — K56609 Unspecified intestinal obstruction, unspecified as to partial versus complete obstruction: Secondary | ICD-10-CM

## 2013-09-13 LAB — GLUCOSE, CAPILLARY
Glucose-Capillary: 109 mg/dL — ABNORMAL HIGH (ref 70–99)
Glucose-Capillary: 114 mg/dL — ABNORMAL HIGH (ref 70–99)
Glucose-Capillary: 129 mg/dL — ABNORMAL HIGH (ref 70–99)
Glucose-Capillary: 130 mg/dL — ABNORMAL HIGH (ref 70–99)
Glucose-Capillary: 130 mg/dL — ABNORMAL HIGH (ref 70–99)

## 2013-09-13 LAB — COMPREHENSIVE METABOLIC PANEL
ALT: 27 U/L (ref 0–53)
AST: 24 U/L (ref 0–37)
Albumin: 2.3 g/dL — ABNORMAL LOW (ref 3.5–5.2)
Alkaline Phosphatase: 49 U/L (ref 39–117)
BUN: 10 mg/dL (ref 6–23)
CO2: 27 meq/L (ref 19–32)
CREATININE: 0.81 mg/dL (ref 0.50–1.35)
Calcium: 8.3 mg/dL — ABNORMAL LOW (ref 8.4–10.5)
Chloride: 106 mEq/L (ref 96–112)
GLUCOSE: 124 mg/dL — AB (ref 70–99)
Potassium: 3.4 mEq/L — ABNORMAL LOW (ref 3.7–5.3)
Sodium: 144 mEq/L (ref 137–147)
Total Bilirubin: 0.2 mg/dL — ABNORMAL LOW (ref 0.3–1.2)
Total Protein: 5.6 g/dL — ABNORMAL LOW (ref 6.0–8.3)

## 2013-09-13 LAB — PHOSPHORUS: PHOSPHORUS: 2.4 mg/dL (ref 2.3–4.6)

## 2013-09-13 LAB — CBC
HCT: 29.4 % — ABNORMAL LOW (ref 39.0–52.0)
Hemoglobin: 9.7 g/dL — ABNORMAL LOW (ref 13.0–17.0)
MCH: 32.3 pg (ref 26.0–34.0)
MCHC: 33 g/dL (ref 30.0–36.0)
MCV: 98 fL (ref 78.0–100.0)
Platelets: 320 10*3/uL (ref 150–400)
RBC: 3 MIL/uL — ABNORMAL LOW (ref 4.22–5.81)
RDW: 13.2 % (ref 11.5–15.5)
WBC: 9.2 10*3/uL (ref 4.0–10.5)

## 2013-09-13 LAB — DIFFERENTIAL
Basophils Absolute: 0.1 10*3/uL (ref 0.0–0.1)
Basophils Relative: 1 % (ref 0–1)
Eosinophils Absolute: 0.3 10*3/uL (ref 0.0–0.7)
Eosinophils Relative: 3 % (ref 0–5)
LYMPHS PCT: 10 % — AB (ref 12–46)
Lymphs Abs: 0.9 10*3/uL (ref 0.7–4.0)
MONO ABS: 1 10*3/uL (ref 0.1–1.0)
Monocytes Relative: 11 % (ref 3–12)
NEUTROS PCT: 75 % (ref 43–77)
Neutro Abs: 6.9 10*3/uL (ref 1.7–7.7)

## 2013-09-13 LAB — PREALBUMIN: Prealbumin: 11.6 mg/dL — ABNORMAL LOW (ref 17.0–34.0)

## 2013-09-13 LAB — MAGNESIUM: MAGNESIUM: 2 mg/dL (ref 1.5–2.5)

## 2013-09-13 LAB — CULTURE, BLOOD (ROUTINE X 2)
CULTURE: NO GROWTH
Culture: NO GROWTH

## 2013-09-13 LAB — TRIGLYCERIDES: Triglycerides: 140 mg/dL (ref <150)

## 2013-09-13 MED ORDER — SODIUM CHLORIDE 0.9 % IV SOLN
500.0000 mL | Freq: Once | INTRAVENOUS | Status: AC
  Administered 2013-09-13: 500 mL via INTRAVENOUS

## 2013-09-13 MED ORDER — POTASSIUM PHOSPHATE DIBASIC 3 MMOLE/ML IV SOLN
10.0000 mmol | Freq: Once | INTRAVENOUS | Status: AC
  Administered 2013-09-13: 10 mmol via INTRAVENOUS
  Filled 2013-09-13: qty 3.33

## 2013-09-13 MED ORDER — POTASSIUM CHLORIDE IN NACL 40-0.9 MEQ/L-% IV SOLN
INTRAVENOUS | Status: AC
  Administered 2013-09-13: 75 mL/h via INTRAVENOUS
  Administered 2013-09-14: 06:00:00 via INTRAVENOUS
  Filled 2013-09-13 (×2): qty 1000

## 2013-09-13 MED ORDER — FAT EMULSION 20 % IV EMUL
250.0000 mL | INTRAVENOUS | Status: AC
  Administered 2013-09-13: 250 mL via INTRAVENOUS
  Filled 2013-09-13: qty 250

## 2013-09-13 MED ORDER — THIAMINE HCL 100 MG/ML IJ SOLN
INTRAVENOUS | Status: AC
  Administered 2013-09-13: 18:00:00 via INTRAVENOUS
  Filled 2013-09-13: qty 2000

## 2013-09-13 MED ORDER — DIPHENHYDRAMINE HCL 50 MG/ML IJ SOLN
12.5000 mg | Freq: Once | INTRAMUSCULAR | Status: AC
  Administered 2013-09-13: 12.5 mg via INTRAVENOUS
  Filled 2013-09-13: qty 1

## 2013-09-13 MED ORDER — POTASSIUM CHLORIDE 10 MEQ/50ML IV SOLN
10.0000 meq | INTRAVENOUS | Status: AC
  Administered 2013-09-13 (×5): 10 meq via INTRAVENOUS
  Filled 2013-09-13 (×5): qty 50

## 2013-09-13 MED ORDER — SODIUM CHLORIDE 0.9 % IJ SOLN
10.0000 mL | INTRAMUSCULAR | Status: DC | PRN
  Administered 2013-09-13 – 2013-09-17 (×4): 10 mL
  Administered 2013-09-19: 20 mL
  Administered 2013-09-20 – 2013-09-24 (×3): 10 mL
  Administered 2013-09-25: 20 mL
  Administered 2013-09-25: 30 mL
  Administered 2013-09-26 (×2): 10 mL
  Administered 2013-09-27 – 2013-09-29 (×3): 20 mL
  Administered 2013-09-29 (×2): 10 mL

## 2013-09-13 NOTE — Progress Notes (Signed)
Urology Progress Note  Subjective:     No acute urologic events overnight. No significant abdominal pain, biggest complaint is for an NG tube. Was able to get out of bed yesterday to a chair. Staples were removed from the incisions, and the incisions were Steri-Stripped  ROS: Positive: RUE/RLE weakness.  Negative: Chest pain.  Objective:  Patient Vitals for the past 24 hrs:  BP Temp Temp src Pulse Resp SpO2  09/13/13 0437 164/105 mmHg 99.2 F (37.3 C) Oral 101 18 96 %  09/12/13 2010 167/98 mmHg 98.6 F (37 C) Oral 92 18 95 %  09/12/13 1330 158/87 mmHg 98.5 F (36.9 C) Oral 103 18 98 %    Physical Exam: General:  No acute distress, awake Abdomen:               []  Soft, appropriately TTP  [x]  Soft, Distended. No rebound TTP. Incisions c/d/i without erythema.  []  Soft, appropriately TTP, incision(s) clean/dry/intact  Genitourinary: Foley in place. Clear urine, dark. Negative edema.     I/O last 3 completed shifts: In: 4230.3 [I.V.:2657.5; NG/GT:30; IV Piggyback:1100] Out: 5800 [Urine:1000; Emesis/NG output:4800]  Recent Labs     09/12/13  0530  09/13/13  0509  HGB  10.1*  9.7*  WBC  9.1  9.2  PLT  332  320    Recent Labs     09/12/13  1720  09/13/13  0509  NA  144  144  K  3.4*  3.4*  CL  103  106  CO2  26  27  BUN  11  10  CREATININE  0.84  0.81  CALCIUM  8.2*  8.3*  GFRNONAA  >90  >90  GFRAA  >90  >90     No results found for this basename: PT, INR, APTT,  in the last 72 hours   No components found with this basename: ABG,   KUB is pending today  Length of stay: 3 days.  Assessment: UTI. C. Diff colitis.  Robotic radical prostatectomy 08/29/13.   Plan: -Continue conservative management for bowels. Appreciate both surgery and GI involvement.  -Plan for foley: KUB daily until PO contrast clears the bowel. After contrast clears, will order plain film cystogram to ensure no urine leak before d/c foley. KUB is still outstanding today.  -Continue  IV ancef for UTI.  -Will follow.   Rolan Bucco, MD [phone]

## 2013-09-13 NOTE — Progress Notes (Signed)
PROGRESS NOTE    Tanner [name] NLG:[id number] DOB: 24-Sep-1955 DOA: 09/10/2013 PCP: Criselda Peaches, MD Primary Urologist: Dr. Rolan Bucco  HPI/Brief narrative 58 year old male with history of prostate cancer, hypertension, alcoholic hepatitis, status post radical robotic prostatectomy with bilateral pelvic lymph node dissection and lysis of colonic additions on 08/29/13, postop acute left corner radiata infarct with associated right hemiparesis on 09/02/13, transferred to inpatient rehabilitation on 09/05/13, developed nausea, vomiting, diarrhea and abdominal distention. GI was consulted and diagnosed postop ileus-narcotics were stopped, NG tube and rectal tube were placed, started on IV Reglan, found to be hypokalemic and urine cultures grew Escherichia coli 55,000 colonies. Due to worsening condition, he was transferred to the hospital on 09/10/13 for further management.  Assessment/Plan:  1. Ileus versus SBO: Ileus multifactorial from immobility, C. difficile, hypokalemia. CT abdomen however suggested SBO with transition point. Continue n.p.o., NG tube, rectal tube, IV fluids, aggressive potassium replacement and follow clinically. GI surgical input appreciated-continue current conservative treatment. Started TNA 3/6. No significant change since 3/6 2. Severe hypokalemia: Secondary to GI losses. Magnesium normal. Slowly improving. Continue to aggressively replace and follow BMP. 3. C. difficile colitis: Continue IV Flagyl and by mouth vancomycin added with NG tube clamping. Not on PPIs. Minimize antibiotics. 4. Escherichia coli UTI versus colonization: Patient is on day 4 of IV antibiotics-changed from Rocephin to cefazolin. Urology input appreciated-Foley decision to be made by urology. 5. Hypertension: Fluctuating and uncontrolled. Continue IV metoprolol-increased to 5 mg IV every 6 hours on 3/6. 6. Anemia: Stable. 7. Recent stroke with right hemiparesis: Aspirin with NG tube clamping.  Physical therapy. 8. Recent radical robotic prostatectomy: Urology has seen. 9. Incidental finding of right ICA aneurysm: Will need followup with repeat imaging in one year.   Code Status: Full Family Communication: Discussed extensively ex-spouse at bedside Disposition Plan: ? Return to CIR when medically stable   Consultants:  Gastroenterology  General surgery  Urology  Procedures:  Foley catheter  NG tube  Rectal tube  Antibiotics:  IV cefazolin 3/3 > 3/4  IV Rocephin 3/4 > 3/5  IV cefazolin 3/5 >  Subjective: Doesn't feel any different than he did yesterday. Abdominal distention definitely better than couple days ago. Wants to eat some flavored ice.  Objective: Filed Vitals:   09/12/13 0900 09/12/13 1330 09/12/13 2010 09/13/13 0437  BP: 152/91 158/87 167/98 164/105  Pulse: 123 103 92 101  Temp:  98.5 F (36.9 C) 98.6 F (37 C) 99.2 F (37.3 C)  TempSrc:  Oral Oral Oral  Resp:  18 18 18   Height:      Weight:      SpO2:  98% 95% 96%    Intake/Output Summary (Last 24 hours) at 09/13/13 1241 Last data filed at 09/13/13 1140  Gross per 24 hour  Intake 5019.91 ml  Output   4620 ml  Net 399.91 ml   Filed Weights   09/10/13 2113 09/11/13 0500 09/12/13 0700  Weight: 71 kg (156 lb 8.4 oz) 71 kg (156 lb 8.4 oz) 80 kg (176 lb 5.9 oz)     Exam:  General exam: Lying comfortably in bed. Respiratory system: Slightly diminished breath sounds in the bases but otherwise clear to auscultation. No increased work of breathing. Cardiovascular system: S1 & S2 heard, RRR. No JVD, murmurs, gallops, clicks or pedal edema. Telemetry: Sinus rhythm in the 90s-sinus tachycardia in the 100s. Gastrointestinal system: Abdomen is less distended, soft and nontender with few bowel sounds. Central nervous system: Alert and oriented.  No cranial deficits. Extremities: 5 x 5 power in left limbs and 4 x 5 power in right limbs   Data Reviewed: Basic Metabolic Panel:  Recent  Labs Lab 09/09/13 0500 09/10/13 0625  09/10/13 2325 09/11/13 0453 09/11/13 2000 09/12/13 0530 09/12/13 1720 09/13/13 0509  NA 136* 136*  < >  --  137 136* 140 144 144  K 2.7* 2.4*  < >  --  2.9* 3.1* 3.2* 3.4* 3.4*  CL 91* 94*  < >  --  95* 97 99 103 106  CO2 25 27  < >  --  26 23 27 26 27   GLUCOSE 128* 139*  < >  --  95 89 93 97 124*  BUN 22 19  < >  --  13 12 11 11 10   CREATININE 1.28 0.97  < > 0.94 0.90 0.84 0.84 0.84 0.81  CALCIUM 8.7 8.4  < >  --  8.5 8.4 8.7 8.2* 8.3*  MG  --  2.1  --  2.0  --   --   --   --  2.0  PHOS  --   --   --   --   --   --   --   --  2.4  < > = values in this interval not displayed. Liver Function Tests:  Recent Labs Lab 09/08/13 0645 09/11/13 0453 09/13/13 0509  AST 11 30 24   ALT 8 20 27   ALKPHOS 48 47 49  BILITOT 0.5 0.3 0.2*  PROT 6.1 5.9* 5.6*  ALBUMIN 2.1* 2.3* 2.3*   No results found for this basename: LIPASE, AMYLASE,  in the last 168 hours No results found for this basename: AMMONIA,  in the last 168 hours CBC:  Recent Labs Lab 09/08/13 0645 09/09/13 0649 09/10/13 2325 09/11/13 0453 09/12/13 0530 09/13/13 0509  WBC 8.3 9.4 7.8 8.1 9.1 9.2  NEUTROABS 6.7 7.7  --  5.8  --  6.9  HGB 11.3* 10.7* 9.8* 10.0* 10.1* 9.7*  HCT 31.4* 30.4* 28.6* 29.6* 30.3* 29.4*  MCV 93.7 94.4 95.3 96.4 96.8 98.0  PLT 330 361 308 336 332 320   Cardiac Enzymes: No results found for this basename: CKTOTAL, CKMB, CKMBINDEX, TROPONINI,  in the last 168 hours BNP (last 3 results) No results found for this basename: PROBNP,  in the last 8760 hours CBG:  Recent Labs Lab 09/12/13 1617 09/12/13 2011 09/13/13 0012 09/13/13 0439 09/13/13 1116  GLUCAP 83 106* 109* 114* 130*    Recent Results (from the past 240 hour(s))  CULTURE, BLOOD (ROUTINE X 2)     Status: None   Collection Time    09/06/13  6:33 PM      Result Value Ref Range Status   Specimen Description BLOOD RIGHT ARM   Final   Special Requests BOTTLES DRAWN AEROBIC AND ANAEROBIC  5CC   Final   Culture  Setup Time     Final   Value: 09/07/2013 02:57     Performed at [facility]   Culture     Final   Value:        BLOOD CULTURE RECEIVED NO GROWTH TO DATE CULTURE WILL BE HELD FOR 5 DAYS BEFORE ISSUING A FINAL NEGATIVE REPORT     Performed at [facility]   Report Status PENDING   Incomplete  CULTURE, BLOOD (ROUTINE X 2)     Status: None   Collection Time    09/06/13  6:45 PM  Result Value Ref Range Status   Specimen Description BLOOD RIGHT HAND   Final   Special Requests BOTTLES DRAWN AEROBIC AND ANAEROBIC 5CC   Final   Culture  Setup Time     Final   Value: 09/07/2013 02:57     Performed at [facility]   Culture     Final   Value:        BLOOD CULTURE RECEIVED NO GROWTH TO DATE CULTURE WILL BE HELD FOR 5 DAYS BEFORE ISSUING A FINAL NEGATIVE REPORT     Performed at [facility]   Report Status PENDING   Incomplete  URINE CULTURE     Status: None   Collection Time    09/06/13  8:15 PM      Result Value Ref Range Status   Specimen Description URINE, RANDOM   Final   Special Requests BAG   Final   Culture  Setup Time     Final   Value: 09/07/2013 03:12     Performed at [facility] Count     Final   Value: 55,000 COLONIES/ML     Performed at [facility]   Culture     Final   Value: ESCHERICHIA COLI     Performed at [facility]   Report Status 09/08/2013 FINAL   Final   Organism ID, Bacteria ESCHERICHIA COLI   Final  CLOSTRIDIUM DIFFICILE BY PCR     Status: Abnormal   Collection Time    09/11/13 12:09 AM      Result Value Ref Range Status   C difficile by pcr POSITIVE (*) NEGATIVE Final   Comment: CRITICAL RESULT CALLED TO, READ BACK BY AND VERIFIED WITH:     Coralyn Mark RN 09/11/13 0740 COSTELLO B       Studies: Dg Abd 1 View  09/12/2013   CLINICAL DATA:  58 year old male requiring cystogram. Evaluation of residual oral contrast. Recent small bowel obstruction. Initial encounter.   EXAM: ABDOMEN - 1 VIEW  COMPARISON:  09/11/2013 and earlier.  FINDINGS: Rectal or bladder catheter remains in place projecting over the lower pelvis. There is a small volume of retained oral contrast in the distal colon, in the pelvis.  Continued dilated gas-filled small bowel loops up to 60 mm diameter, not significantly changed. Scattered abdominal skin staples re- identified. Enteric tube no longer visible. Stable visualized osseous structures.  IMPRESSION: 1. Small volume retained enteric contrast in the rectosigmoid colon. 2. Stable small bowel obstruction gas pattern.   Electronically Signed   By: Lars Pinks M.D.   On: 09/12/2013 08:26   Dg Abd Portable 1v  09/13/2013   CLINICAL DATA:  Evaluate for ileus.  Abdominal distention.  EXAM: PORTABLE ABDOMEN - 1 VIEW  COMPARISON:  09/12/2013  FINDINGS: NG tube is in the proximal stomach. Rectal tube projects over the lower pelvis. Mild gaseous distention of a bowel loop in the right abdomen. This is likely a portion of the colon, likely proximal transverse colon or cecum. Overall gaseous distention of bowel has improved since prior study.  IMPRESSION: Mild continued gaseous distention of a single colonic segment in the right abdomen. Overall gaseous distension of bowel has improved.   Electronically Signed   By: Rolm Baptise M.D.   On: 09/13/2013 10:34        Scheduled Meds: . aspirin  325 mg Oral Daily  .  ceFAZolin (ANCEF) IV  1 g Intravenous 3 times per day  .  enoxaparin (LOVENOX) injection  40 mg Subcutaneous QHS  . insulin aspart  0-9 Units Subcutaneous 4 times per day  . metoprolol  5 mg Intravenous 4 times per day  . metronidazole  500 mg Intravenous Q8H  . potassium chloride  10 mEq Intravenous Q1 Hr x 5  . potassium phosphate IVPB (mmol)  10 mmol Intravenous Once  . vancomycin  500 mg Per Tube 4 times per day   Continuous Infusions: . 0.9 % NaCl with KCl 40 mEq / L 75 mL/hr (09/13/13 0935)  . Marland KitchenTPN (CLINIMIX-E) Adult 30 mL/hr at 09/12/13  2200   And  . fat emulsion 250 mL (09/12/13 2200)  . Marland KitchenTPN (CLINIMIX-E) Adult     And  . fat emulsion      Principal Problem:   Ileus Active Problems:   Malignant neoplasm of prostate   CVA (cerebral infarction)   Hypokalemia   C. difficile colitis   UTI (urinary tract infection)   Essential hypertension, benign    Time spent: 71 minutes    Destina Mantei, MD, FACP, FHM. [facility] Pager [phone]  If 7PM-7AM, please contact night-coverage [email or web address] Password [email or web address] 09/13/2013, 12:41 PM    LOS: 3 days

## 2013-09-13 NOTE — Progress Notes (Signed)
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 Now transfered back to acute for ileus vs SBO.   PT Comments   Pt not moving as well today.  Follow Up Recommendations  CIR     Does the patient have the potential to tolerate intense rehabilitation     Barriers to Discharge        Equipment Recommendations  Other (comment) (TBA)    Recommendations for Other Services    Frequency Min 4X/week   Progress towards PT Goals Progress towards PT goals: Progressing toward goals  Plan Current plan remains appropriate    Precautions / Restrictions Precautions Precautions: Fall Precaution Comments: multiple lines/tubes.   Pertinent Vitals/Pain VSS    Mobility  Bed Mobility Overal bed mobility: Needs Assistance Supine to sit: Mod assist;HOB elevated General bed mobility comments: Assist to bring legs over and trunk up. Transfers Overall transfer level: Needs assistance Equipment used: 2 person hand held assist Transfers: Sit to/from [facility] Sit to Stand: +2 physical assistance;Mod assist Stand pivot transfers: +2 physical assistance;Mod assist General transfer comment: Assist to bring hips up and to assist pivoting feet. Pt with posterior lean. 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 Now transfered back to acute for ileus vs SBO.    Subjective Data      Cognition  Cognition Arousal/Alertness: Awake/alert Behavior During Therapy: WFL for tasks assessed/performed Overall Cognitive Status: Within Functional Limits for tasks assessed    Balance  Balance Standing balance support: Bilateral upper extremity supported Standing balance-Leahy Scale: Poor  End of Session PT - End of Session Activity Tolerance: Patient limited by fatigue Patient left: in chair;with call bell/phone within reach Nurse Communication: Mobility status   GP     [facility] 09/13/2013, 3:12 PM  [facility] PT [phone]

## 2013-09-13 NOTE — Progress Notes (Signed)
Daily Rounding Note  09/13/2013, 10:01 AM  LOS: 3 days   SUBJECTIVE:       Feels about the same.  No abdominal pain, just feels bloated.  Not aware of passing any flatus.   On TNA, NGT and Flexiseal in place.  700 cc in NG canister since 0700 this AM. 1.1 liter recorded yesterday.   Stool output not recorded.  About 200 cc in the bag   OBJECTIVE:         Vital signs in last 24 hours:    Temp:  [98.5 F (36.9 C)-99.2 F (37.3 C)] 99.2 F (37.3 C) (03/07 0437) Pulse Rate:  [92-103] 101 (03/07 0437) Resp:  [18] 18 (03/07 0437) BP: (158-167)/(87-105) 164/105 mmHg (03/07 0437) SpO2:  [95 %-98 %] 96 % (03/07 0437) Last BM Date: 09/13/13 General: pale, mildly ill looking, comfortable   Heart: RRR Chest: clear bil Abdomen: soft, moderately distended with mild tympany.  No tenderness.  Overall no change from 3/6.   Extremities: no CCE Neuro/Psych:  No gross limb weakness.  Oriented x 3.  Relaxed.  Seems depressed.   Intake/Output from previous day: 03/06 0701 - 03/07 0700 In: 3972.8 [I.V.:2400; NG/GT:30; IV Piggyback:1100; TPN:442.8] Out: 3220 [Urine:720; Emesis/NG output:2500]  Intake/Output this shift: Total I/O In: 904.7 [I.V.:777.1; IV Piggyback:50; TPN:77.6] Out: 400 [Emesis/NG output:400]  Lab Results:  Recent Labs  09/11/13 0453 09/12/13 0530 09/13/13 0509  WBC 8.1 9.1 9.2  HGB 10.0* 10.1* 9.7*  HCT 29.6* 30.3* 29.4*  PLT 336 332 320   BMET  Recent Labs  09/12/13 0530 09/12/13 1720 09/13/13 0509  NA 140 144 144  K 3.2* 3.4* 3.4*  CL 99 103 106  CO2 27 26 27   GLUCOSE 93 97 124*  BUN 11 11 10   CREATININE 0.84 0.84 0.81  CALCIUM 8.7 8.2* 8.3*   LFT  Recent Labs  09/11/13 0453 09/13/13 0509  PROT 5.9* 5.6*  ALBUMIN 2.3* 2.3*  AST 30 24  ALT 20 27  ALKPHOS 47 49  BILITOT 0.3 0.2*   PT/INR No results found for this basename: LABPROT, INR,  in the last 72 hours Hepatitis Panel No  results found for this basename: HEPBSAG, HCVAB, HEPAIGM, HEPBIGM,  in the last 72 hours  Studies/Results: Dg Abd 1 View 09/12/2013   CLINICAL DATA:  58 year old male requiring cystogram. Evaluation of residual oral contrast. Recent small bowel obstruction. Initial encounter.  EXAM: ABDOMEN - 1 VIEW  COMPARISON:  09/11/2013 and earlier.  FINDINGS: Rectal or bladder catheter remains in place projecting over the lower pelvis. There is a small volume of retained oral contrast in the distal colon, in the pelvis.  Continued dilated gas-filled small bowel loops up to 60 mm diameter, not significantly changed. Scattered abdominal skin staples re- identified. Enteric tube no longer visible. Stable visualized osseous structures.  IMPRESSION: 1. Small volume retained enteric contrast in the rectosigmoid colon. 2. Stable small bowel obstruction gas pattern.   Electronically Signed   By: Lars Pinks M.D.   On: 09/12/2013 08:26      ASSESMENT:   * Ileus vs SBO Persistent hypokalemia. Flexiseal, NGT, TNA in place.  * C diff on IV Flagyl day 3, po Vanc day.  * E coli UTI. Day 3 Cefazolin.  * S/p prostatectomy 08/29/2013  * Post op CVA. Right hemiparesis has greatly improved.   * Normocytic anemia.     PLAN   *  Await reading of 0900  KUB, ordered KUB for AM tomorrow.  *  Continue TNA *  Wonder if we should d/c the flexiseal and place rectal tube as the flexiseal has foley like inflated bladder that may inhibit passage of flatus.     Azucena Freed  09/13/2013, 10:01 AM Pager: [phone]  GI ATTENDING  Interval history data reviewed. Patient stable. Agree with H&P as above. Continue all measures including correction of potassium. Repeat x-ray in the morning. Continue vancomycin.  Docia Chuck. Geri Seminole., M.D. [facility] Division of Gastroenterology

## 2013-09-13 NOTE — Progress Notes (Signed)
[facility]/Jahanna Raether Progress Note    Subjective: Patient in bed in no acute distress.  Objective: Vital signs in last 24 hours: Temp:  [98.5 F (36.9 C)-99.2 F (37.3 C)] 99.2 F (37.3 C) (03/07 0437) Pulse Rate:  [92-123] 101 (03/07 0437) Resp:  [18] 18 (03/07 0437) BP: (152-167)/(87-105) 164/105 mmHg (03/07 0437) SpO2:  [95 %-98 %] 96 % (03/07 0437) Last BM Date: 09/13/13  Intake/Output from previous day: 03/06 0701 - 03/07 0700 In: 3972.8 [I.V.:2400; NG/GT:30; IV Piggyback:1100; TPN:442.8] Out: 3220 [Urine:720; Emesis/NG output:2500] Intake/Output this shift: Total I/O In: 354.7 [I.V.:277.1; TPN:77.6] Out: 400 [Emesis/NG output:400]  General: Seems depressed.  Taking lots of ice chips  Lungs: Clear to auscultation  Abd: Distended, hypoactive bowel sounds.  Still with > 2.5 liters output from NGT yesterday.  Has dark bilious output this AM.  Still having diarrhea, but the amount is not documented.  Extremities: No edema  Neuro: Intact  Lab Results:  @LABLAST2 (wbc:2,hgb:2,hct:2,plt:2) BMET  Recent Labs  09/12/13 1720 09/13/13 0509  NA 144 144  K 3.4* 3.4*  CL 103 106  CO2 26 27  GLUCOSE 97 124*  BUN 11 10  CREATININE 0.84 0.81  CALCIUM 8.2* 8.3*   PT/INR No results found for this basename: LABPROT, INR,  in the last 72 hours ABG  Recent Labs  09/10/13 2100  PHART 7.503*  HCO3 26.8*    Studies/Results: Dg Abd 1 View  09/12/2013   CLINICAL DATA:  58 year old male requiring cystogram. Evaluation of residual oral contrast. Recent small bowel obstruction. Initial encounter.  EXAM: ABDOMEN - 1 VIEW  COMPARISON:  09/11/2013 and earlier.  FINDINGS: Rectal or bladder catheter remains in place projecting over the lower pelvis. There is a small volume of retained oral contrast in the distal colon, in the pelvis.  Continued dilated gas-filled small bowel loops up to 60 mm diameter, not significantly changed. Scattered abdominal skin staples re- identified. Enteric tube no  longer visible. Stable visualized osseous structures.  IMPRESSION: 1. Small volume retained enteric contrast in the rectosigmoid colon. 2. Stable small bowel obstruction gas pattern.   Electronically Signed   By: Lars Pinks M.D.   On: 09/12/2013 08:26   Dg Abd Portable 1v  09/11/2013   CLINICAL DATA:  Evaluate for small bowel obstruction  EXAM: PORTABLE ABDOMEN - 1 VIEW  COMPARISON:  CT ABD/PELVIS W CM dated 09/11/2013  FINDINGS: Air is appreciated within multiple dilated loops small bowel. There is a paucity of distal bowel gas. In NG tube projects in the region of a gaseous distended stomach. There is mild levoscoliosis of thoracolumbar spine. Surgical clips project right lower quadrant of the abdomen.  IMPRESSION: Findings consistent with small-bowel obstruction. There is no appreciable distal bowel gas. Continued surveillance evaluation recommended.   Electronically Signed   By: Margaree Mackintosh M.D.   On: 09/11/2013 10:14    Anti-infectives: Anti-infectives   Start     Dose/Rate Route Frequency Ordered Stop   09/11/13 2200  ceFAZolin (ANCEF) IVPB 1 g/50 mL premix     1 g 100 mL/hr over 30 Minutes Intravenous 3 times per day 09/11/13 1758     09/11/13 1200  vancomycin (VANCOCIN) 50 mg/mL oral solution 500 mg     500 mg Per Tube 4 times per day 09/11/13 1159     09/11/13 1000  metroNIDAZOLE (FLAGYL) IVPB 500 mg     500 mg 100 mL/hr over 60 Minutes Intravenous Every 8 hours 09/11/13 0932     09/10/13 2230  cefTRIAXone (ROCEPHIN) 1 g in dextrose 5 % 50 mL IVPB  Status:  Discontinued     1 g 100 mL/hr over 30 Minutes Intravenous Every 24 hours 09/10/13 2145 09/11/13 1758      Assessment/Plan: s/p  Dehydrated. Urine output is low, NGT output is high.  Skin turgor is decreased.  Will bolus with fluids.  LOS: 3 days   Kathryne Eriksson. Dahlia Bailiff, MD, FACS [phone] [facility] 09/13/2013

## 2013-09-13 NOTE — Progress Notes (Addendum)
PARENTERAL NUTRITION CONSULT NOTE - FOLLOW UP  Pharmacy Consult:  TPN Indication:  Prolonged ileus  Allergies  Allergen Reactions  . Dilaudid [Hydromorphone Hcl] Nausea And Vomiting    Pre pt history  . Oxycodone Nausea And Vomiting  . Procaine Hcl Other (See Comments)    Patient states he is allergic to novicaine  . Zofran [Ondansetron] Nausea And Vomiting  . Sulfa Antibiotics Rash    Patient Measurements: Height: 5\' 10"  (177.8 cm) Weight: 176 lb 5.9 oz (80 kg) IBW/kg (Calculated) : 73  Vital Signs: Temp: 99.2 F (37.3 C) (03/07 0437) Temp src: Oral (03/07 0437) BP: 164/105 mmHg (03/07 0437) Pulse Rate: 101 (03/07 0437) Intake/Output from previous day: 03/06 0701 - 03/07 0700 In: 3972.8 [I.V.:2400; NG/GT:30; IV Piggyback:1100; TPN:442.8] Out: 3220 [Urine:720; Emesis/NG output:2500] Intake/Output from this shift: Total I/O In: 354.7 [I.V.:277.1; TPN:77.6] Out: 400 [Emesis/NG output:400]  Labs:  Recent Labs  09/11/13 0453 09/12/13 0530 09/13/13 0509  WBC 8.1 9.1 9.2  HGB 10.0* 10.1* 9.7*  HCT 29.6* 30.3* 29.4*  PLT 336 332 320     Recent Labs  09/10/13 2325 09/11/13 0453  09/12/13 0530 09/12/13 1720 09/13/13 0509  NA  --  137  < > 140 144 144  K  --  2.9*  < > 3.2* 3.4* 3.4*  CL  --  95*  < > 99 103 106  CO2  --  26  < > 27 26 27   GLUCOSE  --  95  < > 93 97 124*  BUN  --  13  < > 11 11 10   CREATININE 0.94 0.90  < > 0.84 0.84 0.81  CALCIUM  --  8.5  < > 8.7 8.2* 8.3*  MG 2.0  --   --   --   --  2.0  PHOS  --   --   --   --   --  2.4  PROT  --  5.9*  --   --   --  5.6*  ALBUMIN  --  2.3*  --   --   --  2.3*  AST  --  30  --   --   --  24  ALT  --  20  --   --   --  27  ALKPHOS  --  47  --   --   --  49  BILITOT  --  0.3  --   --   --  0.2*  TRIG  --   --   --   --   --  140  < > = values in this interval not displayed. Estimated Creatinine Clearance: 106.4 ml/min (by C-G formula based on Cr of 0.81).    Recent Labs  09/12/13 2011  09/13/13 0012 09/13/13 0439  GLUCAP 106* 109* 114*     Insulin Requirements in the past 24 hours:  None needed  Assessment: 57 YOM admitted on 08/29/13 for work-up of prostate cancer and underwent radical prostatectomy.  He was diagnosed with a new CVA post-op as well as right ICA aneurysm.  Discharged to Rehab on 09/06/13 and was later found to have an ileus requiring NGT for decompression.  Returned to the acute care services on 09/10/13 for further management.  Pharmacy consulted to manage TPN due to prolonged ileus.  GI: hx GERD.  Returned to [facility] with ileus vs SBO - minimal intake during hospitalization, some concern for refeeding.  +C.diff colitis.  NG O/P decreasing but remains significant.  Abd less distended and BS improved per MD.  TG WNL. Endo: no hx DM, A1c 5% - CBGs controlled Lytes: hypokalemia (IVF containing KCL expired, goal K >/= 4 for ileus), others WNL (Phos low normal) Renal: SCr 0.81 (stable), CrCL 106 ml/min, decent UOP Cards: HTN - BP elevated, tachycardic - IV Lopressor (dose increased yesterday) Hepatobil: EtOH hepatitis - LFTs / tbili WNL Neuro: anxiety / recent CVA - on ASA ID: Ancef for E.coli UTI + Flagyl/PO Vanc for +C.diff, afebrile, WBC WNL Best Practices: Lovenox TPN Access:  PICC placed 09/09/13 TPN day#: 1 (3/6 >> )  Current Nutrition:  TPN  Nutritional Goals:  1800-1900 kCal, 95-110 grams of protein per day   Plan:  - Increase Clinimix E 5/15 to 50 ml/hr (goal rate ~80 ml/hr) and lipids to 10 ml/hr - Daily IV multivitamin, folic acid, and thiamine in TPN - Trace elements on MWF only d/t national shortage - Continue sensitive SSI Q6H.  D/C if CBGs remain controlled at goal TPN rate - KPhos 10 mmol IV x 1 - KCL x 5 runs - F/U AM labs    Breta Demedeiros D. Mina Marble, PharmD, BCPS Pager:  [phone] 09/13/2013, 8:53 AM

## 2013-09-13 NOTE — Progress Notes (Signed)
Pt c/o nausea; pt given 4mg  IV Zofran at this time; will cont. To monitor; pt also feels as though NG tube is coiled in throat; xray ordered to verify NG tube placement.

## 2013-09-13 NOTE — Progress Notes (Signed)
K 3.4 this AM; IVF were infusing NS with 40 kcl @ 182ml/hr but order was for 16 hrs and order has now expired; MD paged to make aware of K results; will await callback.

## 2013-09-14 ENCOUNTER — Inpatient Hospital Stay (HOSPITAL_COMMUNITY): Payer: Medicaid Other

## 2013-09-14 LAB — GLUCOSE, CAPILLARY
GLUCOSE-CAPILLARY: 113 mg/dL — AB (ref 70–99)
GLUCOSE-CAPILLARY: 117 mg/dL — AB (ref 70–99)
Glucose-Capillary: 130 mg/dL — ABNORMAL HIGH (ref 70–99)
Glucose-Capillary: 134 mg/dL — ABNORMAL HIGH (ref 70–99)

## 2013-09-14 LAB — BASIC METABOLIC PANEL
BUN: 10 mg/dL (ref 6–23)
CHLORIDE: 110 meq/L (ref 96–112)
CO2: 30 mEq/L (ref 19–32)
Calcium: 8.3 mg/dL — ABNORMAL LOW (ref 8.4–10.5)
Creatinine, Ser: 0.83 mg/dL (ref 0.50–1.35)
GFR calc Af Amer: >90 mL/min (ref >90)
GFR calc non Af Amer: >90 mL/min (ref >90)
Glucose, Bld: 132 mg/dL — ABNORMAL HIGH (ref 70–99)
Potassium: 3.5 mEq/L — ABNORMAL LOW (ref 3.7–5.3)
Sodium: 151 mEq/L — ABNORMAL HIGH (ref 137–147)

## 2013-09-14 LAB — PHOSPHORUS: Phosphorus: 3.8 mg/dL (ref 2.3–4.6)

## 2013-09-14 LAB — MAGNESIUM: Magnesium: 2 mg/dL (ref 1.5–2.5)

## 2013-09-14 MED ORDER — POTASSIUM CHLORIDE 10 MEQ/50ML IV SOLN
10.0000 meq | INTRAVENOUS | Status: AC
  Administered 2013-09-14 (×6): 10 meq via INTRAVENOUS
  Filled 2013-09-14 (×12): qty 50

## 2013-09-14 MED ORDER — FAT EMULSION 20 % IV EMUL
250.0000 mL | INTRAVENOUS | Status: AC
  Administered 2013-09-14: 250 mL via INTRAVENOUS
  Filled 2013-09-14: qty 250

## 2013-09-14 MED ORDER — PHENOL 1.4 % MT LIQD: 1.0000 | OROMUCOSAL | Status: DC | PRN

## 2013-09-14 MED ORDER — POTASSIUM CHLORIDE IN NACL 40-0.9 MEQ/L-% IV SOLN
INTRAVENOUS | Status: DC
  Administered 2013-09-14: 06:00:00 via INTRAVENOUS
  Filled 2013-09-14: qty 1000

## 2013-09-14 MED ORDER — THIAMINE HCL 100 MG/ML IJ SOLN
INTRAVENOUS | Status: AC
  Administered 2013-09-14: 18:00:00 via INTRAVENOUS
  Filled 2013-09-14: qty 2000

## 2013-09-14 MED ORDER — SODIUM CHLORIDE 0.45 % IV SOLN
INTRAVENOUS | Status: AC
  Administered 2013-09-14: 11:00:00 via INTRAVENOUS
  Filled 2013-09-14: qty 1000

## 2013-09-14 MED ORDER — DIPHENHYDRAMINE HCL 50 MG/ML IJ SOLN
12.5000 mg | Freq: Every evening | INTRAMUSCULAR | Status: DC | PRN
  Administered 2013-09-14: 12.5 mg via INTRAVENOUS
  Filled 2013-09-14 (×2): qty 1

## 2013-09-14 NOTE — Progress Notes (Signed)
General Surgery Note  LOS: 4 days  POD -    16  Assessment/Plan: 1.  SBO  KUB a little worse today, but better than 09/11/2013  According to patient and sister, he is much better than a few days ago.  On Cefazolin/Flagyl/Vanc  Cont NGT, TPN, NPO.  Repeat KUB tomorrow.  Needs to get OOB some more.  2. C. Diff 3.  History of robotic prostatectomy - 08/29/2013 - D. [facility]  4.  DVT prophylaxis - Lovenox 5.  History of CVA 6.  Anemia  Hgb - 9.7 - 09/14/2013 7.  Malnourished  On TPN   Principal Problem:   Ileus Active Problems:   Malignant neoplasm of prostate   CVA (cerebral infarction)   Hypokalemia   C. difficile colitis   UTI (urinary tract infection)   Essential hypertension, benign   Subjective:  Doing fair.  His sister, [facility], is in the room with him.  No specific complaint. Objective:   Filed Vitals:   09/14/13 0510  BP: 163/102  Pulse: 96  Temp: 99.1 F (37.3 C)  Resp:      Intake/Output from previous day:  03/07 0701 - 03/08 0700 In: 4195.5 [I.V.:2234.6; IV Piggyback:753; TPN:1207.9] Out: 9242 [Urine:350; Emesis/NG output:5250; Stool:450]  Intake/Output this shift:  Total I/O In: 421.3 [I.V.:31.3; IV Piggyback:150; TPN:240] Out: 1500 [Urine:100; Emesis/NG output:1000; Stool:400]   Physical Exam:   General: WN WM who is alert and oriented.    HEENT: Normal. Pupils equal.  Has NGT .   Lungs: Clear   Abdomen: Distended.  Rare BS.  No localized tenderness   Wound: Okay   Lab Results:    Recent Labs  09/12/13 0530 09/13/13 0509  WBC 9.1 9.2  HGB 10.1* 9.7*  HCT 30.3* 29.4*  PLT 332 320    BMET   Recent Labs  09/13/13 0509 09/14/13 0516  NA 144 151*  K 3.4* 3.5*  CL 106 110  CO2 27 30  GLUCOSE 124* 132*  BUN 10 10  CREATININE 0.81 0.83  CALCIUM 8.3* 8.3*    PT/INR  No results found for this basename: LABPROT, INR,  in the last 72 hours  ABG  No results found for this basename: PHART, PCO2, PO2, HCO3,  in the last 72  hours   Studies/Results:  Dg Abd Portable 1v  09/14/2013   CLINICAL DATA:  Small bowel obstruction.  EXAM: PORTABLE ABDOMEN - 1 VIEW  COMPARISON:  DG ABD PORTABLE 1V dated 09/13/2013; DG ABD PORTABLE 1V dated 09/13/2013; DG ABD 1 VIEW dated 09/12/2013; DG ABD PORTABLE 1V dated 09/11/2013; CT ABD/PELVIS W CM dated 09/11/2013  FINDINGS: Small bowel dilatation shows some worsening since the most recent radiograph with maximal small bowel caliber of 5.5 cm in the central abdomen. Overall pattern is improved since 3/5. Nasogastric tube extends into the proximal stomach.  IMPRESSION: Slight interval worsening of small bowel dilatation since 3/7. Overall pattern shows improvement since 03/05.   Electronically Signed   By: Aletta Edouard M.D.   On: 09/14/2013 09:05   Dg Abd Portable 1v  09/13/2013   CLINICAL DATA:  Check nasogastric tube placement.  EXAM: PORTABLE ABDOMEN - 1 VIEW  COMPARISON:  KUB from earlier the same day at 9:09 a.m.  FINDINGS: Nasogastric tube enters the proximal stomach, unchanged from before. The side port is near the level is GE junction. Previously seen dilated segment of bowel in the right abdomen no longer visible, which may be related to improvement or differences in  patient positioning. Lung bases clear.  IMPRESSION: Unchanged positioning of nasogastric tube, with side port at the GE junction. This study does not exclude coiling in the chest or neck; consider imaging of these levels if there is ongoing concern for tube redundancy.   Electronically Signed   By: Jorje Guild M.D.   On: 09/13/2013 20:16   Dg Abd Portable 1v  09/13/2013   CLINICAL DATA:  Evaluate for ileus.  Abdominal distention.  EXAM: PORTABLE ABDOMEN - 1 VIEW  COMPARISON:  09/12/2013  FINDINGS: NG tube is in the proximal stomach. Rectal tube projects over the lower pelvis. Mild gaseous distention of a bowel loop in the right abdomen. This is likely a portion of the colon, likely proximal transverse colon or cecum. Overall  gaseous distention of bowel has improved since prior study.  IMPRESSION: Mild continued gaseous distention of a single colonic segment in the right abdomen. Overall gaseous distension of bowel has improved.   Electronically Signed   By: Rolm Baptise M.D.   On: 09/13/2013 10:34     Anti-infectives:   Anti-infectives   Start     Dose/Rate Route Frequency Ordered Stop   09/11/13 2200  ceFAZolin (ANCEF) IVPB 1 g/50 mL premix     1 g 100 mL/hr over 30 Minutes Intravenous 3 times per day 09/11/13 1758     09/11/13 1200  vancomycin (VANCOCIN) 50 mg/mL oral solution 500 mg     500 mg Per Tube 4 times per day 09/11/13 1159     09/11/13 1000  metroNIDAZOLE (FLAGYL) IVPB 500 mg     500 mg 100 mL/hr over 60 Minutes Intravenous Every 8 hours 09/11/13 0932     09/10/13 2230  cefTRIAXone (ROCEPHIN) 1 g in dextrose 5 % 50 mL IVPB  Status:  Discontinued     1 g 100 mL/hr over 30 Minutes Intravenous Every 24 hours 09/10/13 2145 09/11/13 1758      Alphonsa Overall, MD, FACS Pager: [facility] Office: [phone] 09/14/2013

## 2013-09-14 NOTE — Progress Notes (Signed)
PROGRESS NOTE    Tanner [name] ZES:[id number] DOB: Apr 13, 1956 DOA: 09/10/2013 PCP: Criselda Peaches, MD Primary Urologist: Dr. Rolan Bucco  HPI/Brief narrative 58 year old male with history of prostate cancer, hypertension, alcoholic hepatitis, status post radical robotic prostatectomy with bilateral pelvic lymph node dissection and lysis of colonic additions on 08/29/13, postop acute left corner radiata infarct with associated right hemiparesis on 09/02/13, transferred to inpatient rehabilitation on 09/05/13, developed nausea, vomiting, diarrhea and abdominal distention. GI was consulted and diagnosed postop ileus-narcotics were stopped, NG tube and rectal tube were placed, started on IV Reglan, found to be hypokalemic and urine cultures grew Escherichia coli 55,000 colonies. Due to worsening condition, he was transferred to the hospital on 09/10/13 for further management.  Assessment/Plan:  1. Ileus versus SBO: Ileus multifactorial from immobility, C. difficile, hypokalemia. CT abdomen however suggested SBO with transition point. Continue n.p.o., NG tube, rectal tube, IV fluids, aggressive potassium replacement and follow clinically. GI & surgery following continue current conservative treatment. Started TNA 3/6. KUB suggests worsening but patient clinically the same. Continued significant NGT & rectal tube drainage. 2. Severe hypokalemia: Secondary to GI losses. Magnesium normal. Slowly improving. Continue to aggressively replace and follow BMP. Hypernatremia-IV fluids change to half normal saline. Follow BMP 3. C. difficile colitis: Continue IV Flagyl and by mouth vancomycin added with NG tube clamping. Not on PPIs. Minimize antibiotics. 4. Escherichia coli UTI versus colonization: Patient is on day 5 of IV antibiotics-changed from Rocephin to cefazolin. Urology input appreciated-Foley decision to be made by urology. 5. Hypertension: Fluctuating and uncontrolled. Continue IV metoprolol-increased  to 5 mg IV every 6 hours on 3/6. 6. Anemia: Stable. 7. Recent stroke with right hemiparesis: Aspirin with NG tube clamping. Physical therapy. 8. Recent radical robotic prostatectomy: Urology has seen. 9. Incidental finding of right ICA aneurysm: Will need followup with repeat imaging in one year.   Code Status: Full Family Communication: Discussed with patient's sister at bedside Disposition Plan: ? Return to CIR when medically stable   Consultants:  Gastroenterology  General surgery  Urology  Procedures:  Foley catheter  NG tube  Rectal tube  Antibiotics:  IV cefazolin 3/3 > 3/4  IV Rocephin 3/4 > 3/5  IV cefazolin 3/5 >  Subjective: Irritation of throat. Asking for something to sleep at night. No other changes.  Objective: Filed Vitals:   09/13/13 0437 09/13/13 1300 09/13/13 2112 09/14/13 0510  BP: 164/105 154/94 166/95 163/102  Pulse: 101 98 104 96  Temp: 99.2 F (37.3 C) 99 F (37.2 C) 99 F (37.2 C) 99.1 F (37.3 C)  TempSrc: Oral Oral Oral Oral  Resp: 18 18 18    Height:      Weight:    76 kg (167 lb 8.8 oz)  SpO2: 96% 94% 95% 95%    Intake/Output Summary (Last 24 hours) at 09/14/13 1323 Last data filed at 09/14/13 1106  Gross per 24 hour  Intake 3069.59 ml  Output   6150 ml  Net -3080.41 ml   Filed Weights   09/11/13 0500 09/12/13 0700 09/14/13 0510  Weight: 71 kg (156 lb 8.4 oz) 80 kg (176 lb 5.9 oz) 76 kg (167 lb 8.8 oz)     Exam:  General exam: Lying comfortably in bed. Respiratory system: clear to auscultation. No increased work of breathing. Cardiovascular system: S1 & S2 heard, RRR. No JVD, murmurs, gallops, clicks or pedal edema. Telemetry: Sinus rhythm in the 90s-sinus tachycardia in the 100s. Gastrointestinal system: Abdomen is mildly distended, soft and  nontender with few bowel sounds-better than yesterday. Central nervous system: Alert and oriented. No cranial deficits. Extremities: 5 x 5 power in left limbs and 4 x 5 power  in right limbs   Data Reviewed: Basic Metabolic Panel:  Recent Labs Lab 09/09/13 0500 09/10/13 0625  09/10/13 2325  09/11/13 2000 09/12/13 0530 09/12/13 1720 09/13/13 0509 09/14/13 0516  NA 136* 136*  < >  --   < > 136* 140 144 144 151*  K 2.7* 2.4*  < >  --   < > 3.1* 3.2* 3.4* 3.4* 3.5*  CL 91* 94*  < >  --   < > 97 99 103 106 110  CO2 25 27  < >  --   < > 23 27 26 27 30   GLUCOSE 128* 139*  < >  --   < > 89 93 97 124* 132*  BUN 22 19  < >  --   < > 12 11 11 10 10   CREATININE 1.28 0.97  < > 0.94  < > 0.84 0.84 0.84 0.81 0.83  CALCIUM 8.7 8.4  < >  --   < > 8.4 8.7 8.2* 8.3* 8.3*  MG  --  2.1  --  2.0  --   --   --   --  2.0 2.0  PHOS  --   --   --   --   --   --   --   --  2.4 3.8  < > = values in this interval not displayed. Liver Function Tests:  Recent Labs Lab 09/08/13 0645 09/11/13 0453 09/13/13 0509  AST 11 30 24   ALT 8 20 27   ALKPHOS 48 47 49  BILITOT 0.5 0.3 0.2*  PROT 6.1 5.9* 5.6*  ALBUMIN 2.1* 2.3* 2.3*   No results found for this basename: LIPASE, AMYLASE,  in the last 168 hours No results found for this basename: AMMONIA,  in the last 168 hours CBC:  Recent Labs Lab 09/08/13 0645 09/09/13 0649 09/10/13 2325 09/11/13 0453 09/12/13 0530 09/13/13 0509  WBC 8.3 9.4 7.8 8.1 9.1 9.2  NEUTROABS 6.7 7.7  --  5.8  --  6.9  HGB 11.3* 10.7* 9.8* 10.0* 10.1* 9.7*  HCT 31.4* 30.4* 28.6* 29.6* 30.3* 29.4*  MCV 93.7 94.4 95.3 96.4 96.8 98.0  PLT 330 361 308 336 332 320   Cardiac Enzymes: No results found for this basename: CKTOTAL, CKMB, CKMBINDEX, TROPONINI,  in the last 168 hours BNP (last 3 results) No results found for this basename: PROBNP,  in the last 8760 hours CBG:  Recent Labs Lab 09/13/13 1116 09/13/13 1602 09/13/13 2331 09/14/13 0423 09/14/13 1138  GLUCAP 130* 129* 130* 113* 130*    Recent Results (from the past 240 hour(s))  CULTURE, BLOOD (ROUTINE X 2)     Status: None   Collection Time    09/06/13  6:33 PM      Result Value  Ref Range Status   Specimen Description BLOOD RIGHT ARM   Final   Special Requests BOTTLES DRAWN AEROBIC AND ANAEROBIC 5CC   Final   Culture  Setup Time     Final   Value: 09/07/2013 02:57     Performed at [facility]   Culture     Final   Value: NO GROWTH 5 DAYS     Performed at [facility]   Report Status 09/13/2013 FINAL   Final  CULTURE, BLOOD (ROUTINE X 2)  Status: None   Collection Time    09/06/13  6:45 PM      Result Value Ref Range Status   Specimen Description BLOOD RIGHT HAND   Final   Special Requests BOTTLES DRAWN AEROBIC AND ANAEROBIC 5CC   Final   Culture  Setup Time     Final   Value: 09/07/2013 02:57     Performed at [facility]   Culture     Final   Value: NO GROWTH 5 DAYS     Performed at [facility]   Report Status 09/13/2013 FINAL   Final  URINE CULTURE     Status: None   Collection Time    09/06/13  8:15 PM      Result Value Ref Range Status   Specimen Description URINE, RANDOM   Final   Special Requests BAG   Final   Culture  Setup Time     Final   Value: 09/07/2013 03:12     Performed at [facility]     Final   Value: 55,000 COLONIES/ML     Performed at [facility]   Culture     Final   Value: ESCHERICHIA COLI     Performed at [facility]   Report Status 09/08/2013 FINAL   Final   Organism ID, Bacteria ESCHERICHIA COLI   Final  CLOSTRIDIUM DIFFICILE BY PCR     Status: Abnormal   Collection Time    09/11/13 12:09 AM      Result Value Ref Range Status   C difficile by pcr POSITIVE (*) NEGATIVE Final   Comment: CRITICAL RESULT CALLED TO, READ BACK BY AND VERIFIED WITH:     Coralyn Mark RN 09/11/13 0740 COSTELLO B       Studies: Dg Abd Portable 1v  09/14/2013   CLINICAL DATA:  Small bowel obstruction.  EXAM: PORTABLE ABDOMEN - 1 VIEW  COMPARISON:  DG ABD PORTABLE 1V dated 09/13/2013; DG ABD PORTABLE 1V dated 09/13/2013; DG ABD 1 VIEW dated 09/12/2013; DG ABD PORTABLE 1V dated  09/11/2013; CT ABD/PELVIS W CM dated 09/11/2013  FINDINGS: Small bowel dilatation shows some worsening since the most recent radiograph with maximal small bowel caliber of 5.5 cm in the central abdomen. Overall pattern is improved since 3/5. Nasogastric tube extends into the proximal stomach.  IMPRESSION: Slight interval worsening of small bowel dilatation since 3/7. Overall pattern shows improvement since 03/05.   Electronically Signed   By: Aletta Edouard M.D.   On: 09/14/2013 09:05   Dg Abd Portable 1v  09/13/2013   CLINICAL DATA:  Check nasogastric tube placement.  EXAM: PORTABLE ABDOMEN - 1 VIEW  COMPARISON:  KUB from earlier the same day at 9:09 a.m.  FINDINGS: Nasogastric tube enters the proximal stomach, unchanged from before. The side port is near the level is GE junction. Previously seen dilated segment of bowel in the right abdomen no longer visible, which may be related to improvement or differences in patient positioning. Lung bases clear.  IMPRESSION: Unchanged positioning of nasogastric tube, with side port at the GE junction. This study does not exclude coiling in the chest or neck; consider imaging of these levels if there is ongoing concern for tube redundancy.   Electronically Signed   By: Jorje Guild M.D.   On: 09/13/2013 20:16   Dg Abd Portable 1v  09/13/2013   CLINICAL DATA:  Evaluate for ileus.  Abdominal distention.  EXAM: PORTABLE ABDOMEN -  1 VIEW  COMPARISON:  09/12/2013  FINDINGS: NG tube is in the proximal stomach. Rectal tube projects over the lower pelvis. Mild gaseous distention of a bowel loop in the right abdomen. This is likely a portion of the colon, likely proximal transverse colon or cecum. Overall gaseous distention of bowel has improved since prior study.  IMPRESSION: Mild continued gaseous distention of a single colonic segment in the right abdomen. Overall gaseous distension of bowel has improved.   Electronically Signed   By: Rolm Baptise M.D.   On: 09/13/2013 10:34         Scheduled Meds: . aspirin  325 mg Oral Daily  .  ceFAZolin (ANCEF) IV  1 g Intravenous 3 times per day  . enoxaparin (LOVENOX) injection  40 mg Subcutaneous QHS  . insulin aspart  0-9 Units Subcutaneous 4 times per day  . metoprolol  5 mg Intravenous 4 times per day  . metronidazole  500 mg Intravenous Q8H  . potassium chloride  10 mEq Intravenous Q1 Hr x 6  . vancomycin  500 mg Per Tube 4 times per day   Continuous Infusions: . Marland KitchenTPN (CLINIMIX-E) Adult 50 mL/hr at 09/14/13 1100   And  . fat emulsion 250 mL (09/14/13 1100)  . Marland KitchenTPN (CLINIMIX-E) Adult     And  . fat emulsion    . sodium chloride 0.45 % with kcl 75 mL/hr at 09/14/13 1100    Principal Problem:   Ileus Active Problems:   Malignant neoplasm of prostate   CVA (cerebral infarction)   Hypokalemia   C. difficile colitis   UTI (urinary tract infection)   Essential hypertension, benign    Time spent: 29 minutes    Zaia Carre, MD, FACP, FHM. [facility] Pager [phone]  If 7PM-7AM, please contact night-coverage [email or web address] Password [email or web address] 09/14/2013, 1:23 PM    LOS: 4 days

## 2013-09-14 NOTE — Progress Notes (Signed)
PARENTERAL NUTRITION CONSULT NOTE - FOLLOW UP  Pharmacy Consult:  TPN Indication:  Prolonged ileus  Allergies  Allergen Reactions  . Dilaudid [Hydromorphone Hcl] Nausea And Vomiting    Pre pt history  . Oxycodone Nausea And Vomiting  . Procaine Hcl Other (See Comments)    Patient states he is allergic to novicaine  . Zofran [Ondansetron] Nausea And Vomiting  . Sulfa Antibiotics Rash    Patient Measurements: Height: 5\' 10"  (177.8 cm) Weight: 167 lb 8.8 oz (76 kg) IBW/kg (Calculated) : 73  Vital Signs: Temp: 99.1 F (37.3 C) (03/08 0510) Temp src: Oral (03/08 0510) BP: 163/102 mmHg (03/08 0510) Pulse Rate: 96 (03/08 0510) Intake/Output from previous day: 03/07 0701 - 03/08 0700 In: 4195.5 [I.V.:2234.6; IV Piggyback:753; TPN:1207.9] Out: 6568 [Urine:350; Emesis/NG output:5250; Stool:450]  Labs:  Recent Labs  09/12/13 0530 09/13/13 0509  WBC 9.1 9.2  HGB 10.1* 9.7*  HCT 30.3* 29.4*  PLT 332 320     Recent Labs  09/12/13 1720 09/13/13 0509 09/14/13 0516  NA 144 144 151*  K 3.4* 3.4* 3.5*  CL 103 106 110  CO2 26 27 30   GLUCOSE 97 124* 132*  BUN 11 10 10   CREATININE 0.84 0.81 0.83  CALCIUM 8.2* 8.3* 8.3*  MG  --  2.0 2.0  PHOS  --  2.4 3.8  PROT  --  5.6*  --   ALBUMIN  --  2.3*  --   AST  --  24  --   ALT  --  27  --   ALKPHOS  --  49  --   BILITOT  --  0.2*  --   PREALBUMIN  --  11.6*  --   TRIG  --  140  --    Estimated Creatinine Clearance: 103.8 ml/min (by C-G formula based on Cr of 0.83).    Recent Labs  09/13/13 1602 09/13/13 2331 09/14/13 0423  GLUCAP 129* 130* 113*     Insulin Requirements in the past 24 hours:  2 units sensitive SSI  Assessment: 17 YOM admitted on 08/29/13 for work-up of prostate cancer and underwent radical prostatectomy.  He was diagnosed with a new CVA post-op as well as right ICA aneurysm.  Discharged to Rehab on 09/06/13 and was later found to have an ileus requiring NGT for decompression.  Returned to the  acute care services on 09/10/13 for further management.  Pharmacy consulted to manage TPN due to prolonged ileus.  GI: hx GERD.  Returned to [facility] with ileus vs SBO - minimal intake during hospitalization, some concern for refeeding.  +C.diff colitis.  NG + stool O/P increasing.  Abd less distended and BS improved per MD.  C/o nausea.  TG WNL, baseline prealbumin low at 11.6. Endo: no hx DM, A1c 5% - CBGs controlled Lytes: hypokalemia (IVF contains KCL and to stop ~1800; goal K >/= 4 for ileus), hypernatremia Renal: SCr 0.83 (stable), CrCL 106 ml/min, low UOP - IVF at 75 ml/hr.  Plain film cystogram once contrast clears to ensure no urine leak.   Cards: HTN - BP elevated, tachycardic - IV Lopressor, ASA Hepatobil: EtOH hepatitis - LFTs / tbili WNL Neuro: anxiety / recent CVA - on ASA, PRN Benadryl + APAP ID: Ancef for E.coli UTI + Flagyl/PO Vanc for +C.diff, afebrile, WBC WNL Best Practices: Lovenox TPN Access:  PICC placed 09/09/13 TPN day#: 2 (3/6 >> )  Current Nutrition:  TPN  Nutritional Goals:  1800-1900 kCal, 95-110 grams of protein per day  Plan:  - Increase Clinimix E 5/15 to 80 ml/hr and continue lipids to 10 ml/hr.  TPN to provide 1843 kCal and 96 gm protein daily. - Daily IV multivitamin, folic acid, and thiamine in TPN - Trace elements on MWF only d/t national shortage - Continue sensitive SSI Q6H.  D/C CBG checks/SSI if CBGs remain controlled at goal TPN rate - Change NS with 51mEq KCL at 75 ml/hr to 1/2NS with 92mEq KCL at 75 mlhr, stop at 1800 per previous order - KCL x 6 runs - F/U anti-hypertensive dosage adjustment for better BP control - F/U AM labs    Dublin Grayer D. Mina Marble, PharmD, BCPS Pager:  [phone] 09/14/2013, 7:54 AM

## 2013-09-14 NOTE — Progress Notes (Signed)
Daily Rounding Note  09/14/2013, 10:04 AM  LOS: 4 days   SUBJECTIVE:      The patient states that he feels a little worse today. Not as bad as previous, however. Abdomen is uncomfortable. Interval history and data reviewed   OBJECTIVE:         Vital signs in last 24 hours:    Temp:  [99 F (37.2 C)-99.1 F (37.3 C)] 99.1 F (37.3 C) (03/08 0510) Pulse Rate:  [96-104] 96 (03/08 0510) Resp:  [18] 18 (03/07 2112) BP: (154-166)/(94-102) 163/102 mmHg (03/08 0510) SpO2:  [94 %-95 %] 95 % (03/08 0510) Weight:  [76 kg (167 lb 8.8 oz)] 76 kg (167 lb 8.8 oz) (03/08 0510) Last BM Date: 09/14/13 General:  Heart: Chest:  Abdomen:  Extremities:  Neuro/Psych:    Intake/Output from previous day: 03/07 0701 - 03/08 0700 In: 4195.5 [I.V.:2234.6; IV Piggyback:753; TPN:1207.9] Out: 6050 [Urine:350; Emesis/NG output:5250; Stool:450]  Intake/Output this shift: Total I/O In: 60 [TPN:60] Out: 100 [Urine:100]  Lab Results:  Recent Labs  09/12/13 0530 09/13/13 0509  WBC 9.1 9.2  HGB 10.1* 9.7*  HCT 30.3* 29.4*  PLT 332 320   BMET  Recent Labs  09/12/13 1720 09/13/13 0509 09/14/13 0516  NA 144 144 151*  K 3.4* 3.4* 3.5*  CL 103 106 110  CO2 26 27 30   GLUCOSE 97 124* 132*  BUN 11 10 10   CREATININE 0.84 0.81 0.83  CALCIUM 8.2* 8.3* 8.3*   LFT  Recent Labs  09/13/13 0509  PROT 5.6*  ALBUMIN 2.3*  AST 24  ALT 27  ALKPHOS 49  BILITOT 0.2*   PT/INR No results found for this basename: LABPROT, INR,  in the last 72 hours Hepatitis Panel No results found for this basename: HEPBSAG, HCVAB, HEPAIGM, HEPBIGM,  in the last 72 hours  Studies/Results: Dg Abd Portable 1v  09/14/2013   CLINICAL DATA:  Small bowel obstruction.  EXAM: PORTABLE ABDOMEN - 1 VIEW  COMPARISON:  DG ABD PORTABLE 1V dated 09/13/2013; DG ABD PORTABLE 1V dated 09/13/2013; DG ABD 1 VIEW dated 09/12/2013; DG ABD PORTABLE 1V dated 09/11/2013; CT ABD/PELVIS  W CM dated 09/11/2013  FINDINGS: Small bowel dilatation shows some worsening since the most recent radiograph with maximal small bowel caliber of 5.5 cm in the central abdomen. Overall pattern is improved since 3/5. Nasogastric tube extends into the proximal stomach.  IMPRESSION: Slight interval worsening of small bowel dilatation since 3/7. Overall pattern shows improvement since 03/05.   Electronically Signed   By: Aletta Edouard M.D.   On: 09/14/2013 09:05   Dg Abd Portable 1v  09/13/2013   CLINICAL DATA:  Check nasogastric tube placement.  EXAM: PORTABLE ABDOMEN - 1 VIEW  COMPARISON:  KUB from earlier the same day at 9:09 a.m.  FINDINGS: Nasogastric tube enters the proximal stomach, unchanged from before. The side port is near the level is GE junction. Previously seen dilated segment of bowel in the right abdomen no longer visible, which may be related to improvement or differences in patient positioning. Lung bases clear.  IMPRESSION: Unchanged positioning of nasogastric tube, with side port at the GE junction. This study does not exclude coiling in the chest or neck; consider imaging of these levels if there is ongoing concern for tube redundancy.   Electronically Signed   By: Jorje Guild M.D.   On: 09/13/2013 20:16   Dg Abd Portable 1v  09/13/2013   CLINICAL DATA:  Evaluate for ileus.  Abdominal distention.  EXAM: PORTABLE ABDOMEN - 1 VIEW  COMPARISON:  09/12/2013  FINDINGS: NG tube is in the proximal stomach. Rectal tube projects over the lower pelvis. Mild gaseous distention of a bowel loop in the right abdomen. This is likely a portion of the colon, likely proximal transverse colon or cecum. Overall gaseous distention of bowel has improved since prior study.  IMPRESSION: Mild continued gaseous distention of a single colonic segment in the right abdomen. Overall gaseous distension of bowel has improved.   Electronically Signed   By: Rolm Baptise M.D.   On: 09/13/2013 10:34    ASSESMENT:   1.  Appears to have more of a small bowel obstruction picture at this point. 2. Clostridium difficile associated diarrhea 3. Hypokalemia. Ongoing 4. Hypernatremia    PLAN   1. Continue NG suction, increase activity, monitor x-rays. Glad general surgery is on board 2. Continue vancomycin 3. Continue to correct potassium. Per primary service 4. Modify IV fluids to avoid progressive hypernatremia. Recommend half normal saline with potassium. Per primary service  Docia Chuck. Geri Seminole., M.D. [facility] Division of Gastroenterology

## 2013-09-14 NOTE — Progress Notes (Signed)
Urology Progress Note  Intv: Bolused due to low UOP PT - up to chair KUB no significant changes  Subjective:     No acute urologic events overnight. No significant abdominal pain, although worse today than yesterday. Was able to get out of bed again yesterday to a chair. Not standing on his own yet.  ROS: Positive: RUE/RLE weakness.  Negative: Chest pain.  Objective: Patient Vitals for the past 24 hrs:  BP Temp Temp src Pulse Resp SpO2 Weight  09/14/13 0510 163/102 mmHg 99.1 F (37.3 C) Oral 96 - 95 % 76 kg (167 lb 8.8 oz)  09/13/13 2112 166/95 mmHg 99 F (37.2 C) Oral 104 18 95 % -  09/13/13 1300 154/94 mmHg 99 F (37.2 C) Oral 98 18 94 % -    Physical Exam: General:  No acute distress, awake Abdomen:               []  Soft, appropriately TTP  [x]  Soft, Distended. No rebound TTP. Incisions c/d/i without erythema.  []  Soft, appropriately TTP, incision(s) clean/dry/intact  Genitourinary: Foley in place. Clear urine, dark. Negative edema.    I/O last 3 completed shifts: In: 6064.8 [I.V.:3632.5; NG/GT:30; IV [id number]:325] Out: 8070 [Urine:870; Emesis/NG output:6750; Stool:450]  Recent Labs     09/12/13  0530  09/13/13  0509  HGB  10.1*  9.7*  WBC  9.1  9.2  PLT  332  320    Recent Labs     09/13/13  0509  09/14/13  0516  NA  144  151*  K  3.4*  3.5*  CL  106  110  CO2  27  30  BUN  10  10  CREATININE  0.81  0.83  CALCIUM  8.3*  8.3*  GFRNONAA  >90  >90  GFRAA  >90  >90     No results found for this basename: PT, INR, APTT,  in the last 72 hours   No components found with this basename: ABG,   KUB is pending today  Length of stay: 4 days.  Assessment: UTI. C. Diff colitis.  Robotic radical prostatectomy 08/29/13.   Plan: - Continue conservative management for bowels. Appreciate both surgery and GI involvement.  - Plan for foley: KUB shows contrast has passed, foley should remain though to help track UOP, which has been low the past few  days.  - Continue IV ancef for UTI, started on 3/5.  - Will follow.

## 2013-09-15 DIAGNOSIS — F322 Major depressive disorder, single episode, severe without psychotic features: Secondary | ICD-10-CM

## 2013-09-15 LAB — COMPREHENSIVE METABOLIC PANEL
ALBUMIN: 2.3 g/dL — AB (ref 3.5–5.2)
ALT: 14 U/L (ref 0–53)
AST: 16 U/L (ref 0–37)
Alkaline Phosphatase: 40 U/L (ref 39–117)
BUN: 13 mg/dL (ref 6–23)
CALCIUM: 8.5 mg/dL (ref 8.4–10.5)
CO2: 30 mEq/L (ref 19–32)
CREATININE: 0.8 mg/dL (ref 0.50–1.35)
Chloride: 109 mEq/L (ref 96–112)
GFR calc Af Amer: >90 mL/min (ref >90)
Glucose, Bld: 136 mg/dL — ABNORMAL HIGH (ref 70–99)
Potassium: 4.2 mEq/L (ref 3.7–5.3)
Sodium: 149 mEq/L — ABNORMAL HIGH (ref 137–147)
Total Bilirubin: <0.2 mg/dL — ABNORMAL LOW (ref 0.3–1.2)
Total Protein: 5.7 g/dL — ABNORMAL LOW (ref 6.0–8.3)

## 2013-09-15 LAB — DIFFERENTIAL
Basophils Absolute: 0 10*3/uL (ref 0.0–0.1)
Basophils Relative: 0 % (ref 0–1)
EOS PCT: 3 % (ref 0–5)
Eosinophils Absolute: 0.3 10*3/uL (ref 0.0–0.7)
LYMPHS ABS: 0.9 10*3/uL (ref 0.7–4.0)
Lymphocytes Relative: 10 % — ABNORMAL LOW (ref 12–46)
MONO ABS: 0.9 10*3/uL (ref 0.1–1.0)
MONOS PCT: 9 % (ref 3–12)
Neutro Abs: 7.2 10*3/uL (ref 1.7–7.7)
Neutrophils Relative %: 77 % (ref 43–77)

## 2013-09-15 LAB — CBC
HEMATOCRIT: 31.1 % — AB (ref 39.0–52.0)
HEMOGLOBIN: 10 g/dL — AB (ref 13.0–17.0)
MCH: 32.5 pg (ref 26.0–34.0)
MCHC: 32.2 g/dL (ref 30.0–36.0)
MCV: 101 fL — AB (ref 78.0–100.0)
Platelets: 247 10*3/uL (ref 150–400)
RBC: 3.08 MIL/uL — AB (ref 4.22–5.81)
RDW: 13.7 % (ref 11.5–15.5)
WBC: 9.3 10*3/uL (ref 4.0–10.5)

## 2013-09-15 LAB — PHOSPHORUS: PHOSPHORUS: 3.9 mg/dL (ref 2.3–4.6)

## 2013-09-15 LAB — GLUCOSE, CAPILLARY
Glucose-Capillary: 130 mg/dL — ABNORMAL HIGH (ref 70–99)
Glucose-Capillary: 141 mg/dL — ABNORMAL HIGH (ref 70–99)
Glucose-Capillary: 144 mg/dL — ABNORMAL HIGH (ref 70–99)
Glucose-Capillary: 121 mg/dL — ABNORMAL HIGH (ref 70–99)

## 2013-09-15 LAB — TRIGLYCERIDES: Triglycerides: 183 mg/dL — ABNORMAL HIGH (ref <150)

## 2013-09-15 LAB — PREALBUMIN: Prealbumin: 12.2 mg/dL — ABNORMAL LOW (ref 17.0–34.0)

## 2013-09-15 LAB — MAGNESIUM: Magnesium: 2.1 mg/dL (ref 1.5–2.5)

## 2013-09-15 MED ORDER — CLONAZEPAM 0.5 MG PO TABS
0.5000 mg | ORAL_TABLET | Freq: Two times a day (BID) | ORAL | Status: DC
  Administered 2013-09-15 – 2013-09-16 (×3): 0.5 mg via ORAL
  Filled 2013-09-15 (×4): qty 1

## 2013-09-15 MED ORDER — SODIUM CHLORIDE 0.45 % IV SOLN
INTRAVENOUS | Status: AC
  Administered 2013-09-15 – 2013-09-16 (×2): via INTRAVENOUS

## 2013-09-15 MED ORDER — LORAZEPAM 2 MG/ML IJ SOLN
0.5000 mg | Freq: Three times a day (TID) | INTRAMUSCULAR | Status: DC | PRN
  Administered 2013-09-15 – 2013-09-17 (×6): 0.5 mg via INTRAVENOUS
  Filled 2013-09-15 (×7): qty 1

## 2013-09-15 MED ORDER — FLUOXETINE HCL 20 MG PO CAPS
20.0000 mg | ORAL_CAPSULE | Freq: Every day | ORAL | Status: DC
  Administered 2013-09-15 – 2013-09-30 (×14): 20 mg via ORAL
  Filled 2013-09-15 (×16): qty 1

## 2013-09-15 MED ORDER — DIPHENHYDRAMINE HCL 50 MG/ML IJ SOLN
12.5000 mg | Freq: Three times a day (TID) | INTRAMUSCULAR | Status: DC | PRN
  Administered 2013-09-15 – 2013-09-24 (×10): 12.5 mg via INTRAVENOUS
  Filled 2013-09-15 (×9): qty 1

## 2013-09-15 MED ORDER — TRACE MINERALS CR-CU-F-FE-I-MN-MO-SE-ZN IV SOLN
INTRAVENOUS | Status: AC
  Administered 2013-09-15: 17:00:00 via INTRAVENOUS
  Filled 2013-09-15: qty 2000

## 2013-09-15 MED ORDER — FAT EMULSION 20 % IV EMUL
250.0000 mL | INTRAVENOUS | Status: AC
  Administered 2013-09-15: 250 mL via INTRAVENOUS
  Filled 2013-09-15: qty 250

## 2013-09-15 MED ORDER — SODIUM CHLORIDE 0.45 % IV SOLN: INTRAVENOUS | Status: DC

## 2013-09-15 NOTE — Progress Notes (Signed)
PROGRESS NOTE    Tanner [name] WJX:[id number] DOB: Dec 12, 1955 DOA: 09/10/2013 PCP: Criselda Peaches, MD Primary Urologist: Dr. Rolan Bucco  HPI/Brief narrative 58 year old male with history of prostate cancer, hypertension, alcoholic hepatitis, status post radical robotic prostatectomy with bilateral pelvic lymph node dissection and lysis of colonic additions on 08/29/13, postop acute left corner radiata infarct with associated right hemiparesis on 09/02/13, transferred to inpatient rehabilitation on 09/05/13, developed nausea, vomiting, diarrhea and abdominal distention. GI was consulted and diagnosed postop ileus-narcotics were stopped, NG tube and rectal tube were placed, started on IV Reglan, found to be hypokalemic and urine cultures grew Escherichia coli 55,000 colonies. Due to worsening condition, he was transferred to the hospital on 09/10/13 for further management.  Assessment/Plan:  1. Ileus versus SBO: Ileus multifactorial from immobility, C. difficile, hypokalemia. CT abdomen however suggested SBO with transition point. Continue n.p.o., NG tube, rectal tube, IV fluids, aggressive potassium replacement and follow clinically. GI & surgery following & recommend continued conservative treatment without surgical needs. Started TNA 3/6. Continued significant NGT & rectal tube drainage (NGT: 3000 & rectal tube: 1150 mL). Will increase IV fluids to make up for GI losses. 2. Severe hypokalemia: Secondary to GI losses. Magnesium normal. Replaced aggressively. Hypernatremia-IV fluids changed to half normal saline. Follow BMP daily 3. C. difficile colitis: Continue IV Flagyl and by mouth vancomycin added with NG tube clamping. Not on PPIs. Minimize antibiotics. Continues to have significant rectal tube drainage. 4. Escherichia coli UTI versus colonization: Patient is on day 6 of IV antibiotics-changed from Rocephin to cefazolin. Urology input appreciated-Foley decision to be made by urology.? DC  antibiotics. 5. Hypertension: Fluctuating and uncontrolled. Continue IV metoprolol-increased to 5 mg IV every 6 hours on 3/6. 6. Anemia: Stable. 7. Recent stroke with right hemiparesis: Aspirin with NG tube clamping. Physical therapy. 8. Recent radical robotic prostatectomy: Urology has seen. 9. Incidental finding of right ICA aneurysm: Will need followup with repeat imaging in one year. 10. Anxiety and depression: Patient and patient's sister at bedside expressed concerns that he is depressed and wishes to see a psychiatrist. Added when necessary IV Ativan pending psychiatric consultation- called.   Code Status: Full Family Communication: Discussed with patient's sister and daughter at bedside Disposition Plan: DC to CIR when medically stable (discussed with rehabilitation team on 3/9)   Consultants:  Gastroenterology  General surgery  Urology  Procedures:  Foley catheter  NG tube  Rectal tube  Antibiotics:  IV cefazolin 3/3 > 3/4  IV Rocephin 3/4 > 3/5  IV cefazolin 3/5 >  Subjective: Feels anxious and depressed. No suicidal ideations. Significant nausea and abdominal discomfort after NG tube was clamped for extended periods overnight. Improved after NG reconnected to suctioning.  Objective: Filed Vitals:   09/15/13 0000 09/15/13 0500 09/15/13 0602 09/15/13 1100  BP: 169/101  159/101 153/99  Pulse:   100 109  Temp:   97.6 F (36.4 C)   TempSrc:   Oral   Resp:   18   Height:      Weight:  76 kg (167 lb 8.8 oz)    SpO2:   95%     Intake/Output Summary (Last 24 hours) at 09/15/13 1148 Last data filed at 09/15/13 0654  Gross per 24 hour  Intake    940 ml  Output   3850 ml  Net  -2910 ml   Filed Weights   09/12/13 0700 09/14/13 0510 09/15/13 0500  Weight: 80 kg (176 lb 5.9 oz) 76 kg (167 lb  8.8 oz) 76 kg (167 lb 8.8 oz)     Exam:  General exam: Lying comfortably in bed. Respiratory system: clear to auscultation. No increased work of  breathing. Cardiovascular system: S1 & S2 heard, RRR. No JVD, murmurs, gallops, clicks or pedal edema. Telemetry: Sinus rhythm in the 90s-sinus tachycardia in the 100s. Gastrointestinal system: Abdomen is mildly distended, soft and nontender with few bowel sounds. Copious drainage via NG and rectal tube. Central nervous system: Alert and oriented. No cranial deficits. Extremities: 5 x 5 power in left limbs and 4 x 5 power in right limbs Psych: Flat affect   Data Reviewed: Basic Metabolic Panel:  Recent Labs Lab 09/10/13 0625  09/10/13 2325  09/12/13 0530 09/12/13 1720 09/13/13 0509 09/14/13 0516 09/15/13 0500  NA 136*  < >  --   < > 140 144 144 151* 149*  K 2.4*  < >  --   < > 3.2* 3.4* 3.4* 3.5* 4.2  CL 94*  < >  --   < > 99 103 106 110 109  CO2 27  < >  --   < > 27 26 27 30 30   GLUCOSE 139*  < >  --   < > 93 97 124* 132* 136*  BUN 19  < >  --   < > 11 11 10 10 13   CREATININE 0.97  < > 0.94  < > 0.84 0.84 0.81 0.83 0.80  CALCIUM 8.4  < >  --   < > 8.7 8.2* 8.3* 8.3* 8.5  MG 2.1  --  2.0  --   --   --  2.0 2.0 2.1  PHOS  --   --   --   --   --   --  2.4 3.8 3.9  < > = values in this interval not displayed. Liver Function Tests:  Recent Labs Lab 09/11/13 0453 09/13/13 0509 09/15/13 0500  AST 30 24 16   ALT 20 27 14   ALKPHOS 47 49 40  BILITOT 0.3 0.2* <0.2*  PROT 5.9* 5.6* 5.7*  ALBUMIN 2.3* 2.3* 2.3*   No results found for this basename: LIPASE, AMYLASE,  in the last 168 hours No results found for this basename: AMMONIA,  in the last 168 hours CBC:  Recent Labs Lab 09/09/13 0649 09/10/13 2325 09/11/13 0453 09/12/13 0530 09/13/13 0509 09/15/13 0500  WBC 9.4 7.8 8.1 9.1 9.2 9.3  NEUTROABS 7.7  --  5.8  --  6.9 7.2  HGB 10.7* 9.8* 10.0* 10.1* 9.7* 10.0*  HCT 30.4* 28.6* 29.6* 30.3* 29.4* 31.1*  MCV 94.4 95.3 96.4 96.8 98.0 101.0*  PLT 361 308 336 332 320 247   Cardiac Enzymes: No results found for this basename: CKTOTAL, CKMB, CKMBINDEX, TROPONINI,  in the  last 168 hours BNP (last 3 results) No results found for this basename: PROBNP,  in the last 8760 hours CBG:  Recent Labs Lab 09/14/13 1653 09/14/13 2001 09/15/13 0029 09/15/13 0556 09/15/13 1112  GLUCAP 117* 134* 141* 130* 144*    Recent Results (from the past 240 hour(s))  CULTURE, BLOOD (ROUTINE X 2)     Status: None   Collection Time    09/06/13  6:33 PM      Result Value Ref Range Status   Specimen Description BLOOD RIGHT ARM   Final   Special Requests BOTTLES DRAWN AEROBIC AND ANAEROBIC 5CC   Final   Culture  Setup Time     Final   Value: 09/07/2013 02:57  Performed at [facility]     Final   Value: NO GROWTH 5 DAYS     Performed at [facility]   Report Status 09/13/2013 FINAL   Final  CULTURE, BLOOD (ROUTINE X 2)     Status: None   Collection Time    09/06/13  6:45 PM      Result Value Ref Range Status   Specimen Description BLOOD RIGHT HAND   Final   Special Requests BOTTLES DRAWN AEROBIC AND ANAEROBIC 5CC   Final   Culture  Setup Time     Final   Value: 09/07/2013 02:57     Performed at [facility]   Culture     Final   Value: NO GROWTH 5 DAYS     Performed at [facility]   Report Status 09/13/2013 FINAL   Final  URINE CULTURE     Status: None   Collection Time    09/06/13  8:15 PM      Result Value Ref Range Status   Specimen Description URINE, RANDOM   Final   Special Requests BAG   Final   Culture  Setup Time     Final   Value: 09/07/2013 03:12     Performed at [facility]     Final   Value: 55,000 COLONIES/ML     Performed at [facility]   Culture     Final   Value: ESCHERICHIA COLI     Performed at [facility]   Report Status 09/08/2013 FINAL   Final   Organism ID, Bacteria ESCHERICHIA COLI   Final  CLOSTRIDIUM DIFFICILE BY PCR     Status: Abnormal   Collection Time    09/11/13 12:09 AM      Result Value Ref Range Status   C difficile by pcr POSITIVE  (*) NEGATIVE Final   Comment: CRITICAL RESULT CALLED TO, READ BACK BY AND VERIFIED WITH:     Coralyn Mark RN 09/11/13 0740 COSTELLO B       Studies: Dg Abd Portable 1v  09/14/2013   CLINICAL DATA:  Small bowel obstruction.  EXAM: PORTABLE ABDOMEN - 1 VIEW  COMPARISON:  DG ABD PORTABLE 1V dated 09/13/2013; DG ABD PORTABLE 1V dated 09/13/2013; DG ABD 1 VIEW dated 09/12/2013; DG ABD PORTABLE 1V dated 09/11/2013; CT ABD/PELVIS W CM dated 09/11/2013  FINDINGS: Small bowel dilatation shows some worsening since the most recent radiograph with maximal small bowel caliber of 5.5 cm in the central abdomen. Overall pattern is improved since 3/5. Nasogastric tube extends into the proximal stomach.  IMPRESSION: Slight interval worsening of small bowel dilatation since 3/7. Overall pattern shows improvement since 03/05.   Electronically Signed   By: Aletta Edouard M.D.   On: 09/14/2013 09:05   Dg Abd Portable 1v  09/13/2013   CLINICAL DATA:  Check nasogastric tube placement.  EXAM: PORTABLE ABDOMEN - 1 VIEW  COMPARISON:  KUB from earlier the same day at 9:09 a.m.  FINDINGS: Nasogastric tube enters the proximal stomach, unchanged from before. The side port is near the level is GE junction. Previously seen dilated segment of bowel in the right abdomen no longer visible, which may be related to improvement or differences in patient positioning. Lung bases clear.  IMPRESSION: Unchanged positioning of nasogastric tube, with side port at the GE junction. This study does not exclude coiling in the chest or neck; consider imaging of these levels  if there is ongoing concern for tube redundancy.   Electronically Signed   By: Jorje Guild M.D.   On: 09/13/2013 20:16        Scheduled Meds: . aspirin  325 mg Oral Daily  .  ceFAZolin (ANCEF) IV  1 g Intravenous 3 times per day  . enoxaparin (LOVENOX) injection  40 mg Subcutaneous QHS  . insulin aspart  0-9 Units Subcutaneous 4 times per day  . metoprolol  5 mg Intravenous 4 times  per day  . metronidazole  500 mg Intravenous Q8H  . vancomycin  500 mg Per Tube 4 times per day   Continuous Infusions: . sodium chloride 125 mL/hr at 09/15/13 0956  . Marland KitchenTPN (CLINIMIX-E) Adult 80 mL/hr at 09/14/13 1748   And  . fat emulsion 250 mL (09/14/13 1747)  . Marland KitchenTPN (CLINIMIX-E) Adult     And  . fat emulsion      Principal Problem:   Ileus Active Problems:   Malignant neoplasm of prostate   CVA (cerebral infarction)   Hypokalemia   C. difficile colitis   UTI (urinary tract infection)   Essential hypertension, benign    Time spent: 54 minutes    Derico Mitton, MD, FACP, FHM. [facility] Pager [phone]  If 7PM-7AM, please contact night-coverage [email or web address] Password [email or web address] 09/15/2013, 11:48 AM    LOS: 5 days

## 2013-09-15 NOTE — Consult Note (Signed)
Reason for Consult: depression and anxiety Referring Physician: Modena Jansky, MD  Tanner [name] is an 58 y.o. male.  HPI: Patient was seen and chart reviewed. Patient daughter was at bedside during this evaluation. Patient reported he has been feeling depressed, sad, tearful, low energy and anxious about going back to his previous functioning before in the hospitalization. Patient stated he has been depressed and being hospitalized and being bedridden over 17 days. Patient daughter has been taking medication for depression and anxiety. Patient denies suicidal or homicidal ideation, intention or plans. Patient has no evidence of psychosis. Patient has no previous history of acute psychiatric hospitalization her outpatient medication management. Patient reported he drinks alcohol more socially does not require any previous treatment for substance abuse. Patient stated he used to smoke tobacco but quit about 6 months ago.   ROS: Patient was bedridden and feeling tired and stated he cannot walk because no strength in his legs.  Mental Status Examination: Patient appeared as per his stated age, calm, quiet and pleasant during this evaluation. He has fair eye contact and increase of psychomotor retardation. Patient has depressed and anxious mood and his affect was constricted. He has normal rate, rhythm, and low volume of speech. His thought process is linear and goal directed. Patient has denied suicidal, homicidal ideations, intentions or plans. Patient has no evidence of auditory or visual hallucinations, delusions, and paranoia. Patient has fair insight judgment and impulse control.  Past Medical History  Diagnosis Date  . Alcoholic hepatitis   . Hypertension   . 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 Shortness of breath     new onset- occasionally-at rest and exertion  . GERD (gastroesophageal reflux disease)   . Anxiety   . TIA (transient ischemic attack) 08/2013  . Arthritis     " in  my spine "  . Ileus 09/2013    Past Surgical History  Procedure Laterality Date  . Hand surgery Right   . Hernia repair    . Orif ankle fracture Right 12/10/2012    Procedure: OPEN REDUCTION INTERNAL FIXATION (ORIF) ANKLE FRACTURE;  Surgeon: Hessie Dibble, MD;  Location: [facility];  Service: Orthopedics;  Laterality: Right;  . Robot assisted laparoscopic radical prostatectomy N/A 08/29/2013    Procedure: ROBOTIC ASSISTED LAPAROSCOPIC RADICAL PROSTATECTOMY LEVEL 2, Lysis of adhesions;  Surgeon: Molli Hazard, MD;  Location: [facility];  Service: Urology;  Laterality: N/A;  . Lymphadenectomy Bilateral 08/29/2013    Procedure: LYMPHADENECTOMY "BILATERAL PELVIC LYMPH NODE DISSECTION";  Surgeon: Molli Hazard, MD;  Location: [facility];  Service: Urology;  Laterality: Bilateral;    Family History  Problem Relation Age of Onset  . Pancreatic cancer Sister   . Lung cancer Father     Social History:  reports that he quit smoking about 8 months ago. He has never used smokeless tobacco. He reports that he drinks alcohol. He reports that he uses illicit drugs.  Allergies:  Allergies  Allergen Reactions  . Dilaudid [Hydromorphone Hcl] Nausea And Vomiting    Pre pt history  . Oxycodone Nausea And Vomiting  . Procaine Hcl Other (See Comments)    Patient states he is allergic to novicaine  . Zofran [Ondansetron] Nausea And Vomiting  . Sulfa Antibiotics Rash    Medications: I have reviewed the patient's current medications.  Results for orders placed during the hospital encounter of 09/10/13 (from the past 48 hour(s))  GLUCOSE, CAPILLARY     Status: Abnormal  Collection Time    09/13/13  4:02 PM      Result Value Ref Range   Glucose-Capillary 129 (*) 70 - 99 mg/dL  GLUCOSE, CAPILLARY     Status: Abnormal   Collection Time    09/13/13 11:31 PM      Result Value Ref Range   Glucose-Capillary 130 (*) 70 - 99 mg/dL  GLUCOSE, CAPILLARY     Status: Abnormal   Collection Time     09/14/13  4:23 AM      Result Value Ref Range   Glucose-Capillary 113 (*) 70 - 99 mg/dL  BASIC METABOLIC PANEL     Status: Abnormal   Collection Time    09/14/13  5:16 AM      Result Value Ref Range   Sodium 151 (*) 137 - 147 mEq/L   Potassium 3.5 (*) 3.7 - 5.3 mEq/L   Chloride 110  96 - 112 mEq/L   CO2 30  19 - 32 mEq/L   Glucose, Bld 132 (*) 70 - 99 mg/dL   BUN 10  6 - 23 mg/dL   Creatinine, Ser 0.83  0.50 - 1.35 mg/dL   Calcium 8.3 (*) 8.4 - 10.5 mg/dL   GFR calc non Af Amer >90  >90 mL/min   GFR calc Af Amer >90  >90 mL/min   Comment: (NOTE)     The eGFR has been calculated using the CKD EPI equation.     This calculation has not been validated in all clinical situations.     eGFR's persistently <90 mL/min signify possible Chronic Kidney     Disease.  PHOSPHORUS     Status: None   Collection Time    09/14/13  5:16 AM      Result Value Ref Range   Phosphorus 3.8  2.3 - 4.6 mg/dL  MAGNESIUM     Status: None   Collection Time    09/14/13  5:16 AM      Result Value Ref Range   Magnesium 2.0  1.5 - 2.5 mg/dL  GLUCOSE, CAPILLARY     Status: Abnormal   Collection Time    09/14/13 11:38 AM      Result Value Ref Range   Glucose-Capillary 130 (*) 70 - 99 mg/dL  GLUCOSE, CAPILLARY     Status: Abnormal   Collection Time    09/14/13  4:53 PM      Result Value Ref Range   Glucose-Capillary 117 (*) 70 - 99 mg/dL   Comment 1 Notify RN    GLUCOSE, CAPILLARY     Status: Abnormal   Collection Time    09/14/13  8:01 PM      Result Value Ref Range   Glucose-Capillary 134 (*) 70 - 99 mg/dL   Comment 1 Documented in Chart     Comment 2 Notify RN    GLUCOSE, CAPILLARY     Status: Abnormal   Collection Time    09/15/13 12:29 AM      Result Value Ref Range   Glucose-Capillary 141 (*) 70 - 99 mg/dL  COMPREHENSIVE METABOLIC PANEL     Status: Abnormal   Collection Time    09/15/13  5:00 AM      Result Value Ref Range   Sodium 149 (*) 137 - 147 mEq/L   Potassium 4.2  3.7 - 5.3 mEq/L    Chloride 109  96 - 112 mEq/L   CO2 30  19 - 32 mEq/L   Glucose, Bld 136 (*) 70 -  99 mg/dL   BUN 13  6 - 23 mg/dL   Creatinine, Ser 0.80  0.50 - 1.35 mg/dL   Calcium 8.5  8.4 - 10.5 mg/dL   Total Protein 5.7 (*) 6.0 - 8.3 g/dL   Albumin 2.3 (*) 3.5 - 5.2 g/dL   AST 16  0 - 37 U/L   ALT 14  0 - 53 U/L   Alkaline Phosphatase 40  39 - 117 U/L   Total Bilirubin <0.2 (*) 0.3 - 1.2 mg/dL   GFR calc non Af Amer >90  >90 mL/min   GFR calc Af Amer >90  >90 mL/min   Comment: (NOTE)     The eGFR has been calculated using the CKD EPI equation.     This calculation has not been validated in all clinical situations.     eGFR's persistently <90 mL/min signify possible Chronic Kidney     Disease.  MAGNESIUM     Status: None   Collection Time    09/15/13  5:00 AM      Result Value Ref Range   Magnesium 2.1  1.5 - 2.5 mg/dL  PHOSPHORUS     Status: None   Collection Time    09/15/13  5:00 AM      Result Value Ref Range   Phosphorus 3.9  2.3 - 4.6 mg/dL  CBC     Status: Abnormal   Collection Time    09/15/13  5:00 AM      Result Value Ref Range   WBC 9.3  4.0 - 10.5 K/uL   RBC 3.08 (*) 4.22 - 5.81 MIL/uL   Hemoglobin 10.0 (*) 13.0 - 17.0 g/dL   HCT 31.1 (*) 39.0 - 52.0 %   MCV 101.0 (*) 78.0 - 100.0 fL   MCH 32.5  26.0 - 34.0 pg   MCHC 32.2  30.0 - 36.0 g/dL   RDW 13.7  11.5 - 15.5 %   Platelets 247  150 - 400 K/uL  DIFFERENTIAL     Status: Abnormal   Collection Time    09/15/13  5:00 AM      Result Value Ref Range   Neutrophils Relative % 77  43 - 77 %   Neutro Abs 7.2  1.7 - 7.7 K/uL   Lymphocytes Relative 10 (*) 12 - 46 %   Lymphs Abs 0.9  0.7 - 4.0 K/uL   Monocytes Relative 9  3 - 12 %   Monocytes Absolute 0.9  0.1 - 1.0 K/uL   Eosinophils Relative 3  0 - 5 %   Eosinophils Absolute 0.3  0.0 - 0.7 K/uL   Basophils Relative 0  0 - 1 %   Basophils Absolute 0.0  0.0 - 0.1 K/uL  PREALBUMIN     Status: Abnormal   Collection Time    09/15/13  5:00 AM      Result Value Ref Range    Prealbumin 12.2 (*) 17.0 - 34.0 mg/dL   Comment: Performed at [facility]     Status: Abnormal   Collection Time    09/15/13  5:00 AM      Result Value Ref Range   Triglycerides 183 (*) <150 mg/dL  GLUCOSE, CAPILLARY     Status: Abnormal   Collection Time    09/15/13  5:56 AM      Result Value Ref Range   Glucose-Capillary 130 (*) 70 - 99 mg/dL  GLUCOSE, CAPILLARY     Status: Abnormal   Collection  Time    09/15/13 11:12 AM      Result Value Ref Range   Glucose-Capillary 144 (*) 70 - 99 mg/dL    Dg Abd Portable 1v  09/14/2013   CLINICAL DATA:  Small bowel obstruction.  EXAM: PORTABLE ABDOMEN - 1 VIEW  COMPARISON:  DG ABD PORTABLE 1V dated 09/13/2013; DG ABD PORTABLE 1V dated 09/13/2013; DG ABD 1 VIEW dated 09/12/2013; DG ABD PORTABLE 1V dated 09/11/2013; CT ABD/PELVIS W CM dated 09/11/2013  FINDINGS: Small bowel dilatation shows some worsening since the most recent radiograph with maximal small bowel caliber of 5.5 cm in the central abdomen. Overall pattern is improved since 3/5. Nasogastric tube extends into the proximal stomach.  IMPRESSION: Slight interval worsening of small bowel dilatation since 3/7. Overall pattern shows improvement since 03/05.   Electronically Signed   By: Aletta Edouard M.D.   On: 09/14/2013 09:05   Dg Abd Portable 1v  09/13/2013   CLINICAL DATA:  Check nasogastric tube placement.  EXAM: PORTABLE ABDOMEN - 1 VIEW  COMPARISON:  KUB from earlier the same day at 9:09 a.m.  FINDINGS: Nasogastric tube enters the proximal stomach, unchanged from before. The side port is near the level is GE junction. Previously seen dilated segment of bowel in the right abdomen no longer visible, which may be related to improvement or differences in patient positioning. Lung bases clear.  IMPRESSION: Unchanged positioning of nasogastric tube, with side port at the GE junction. This study does not exclude coiling in the chest or neck; consider imaging of these levels if there  is ongoing concern for tube redundancy.   Electronically Signed   By: Jorje Guild M.D.   On: 09/13/2013 20:16    Positive for anxiety, bad mood, depression and sleep disturbance Blood pressure 146/92, pulse 97, temperature 98.9 F (37.2 C), temperature source Oral, resp. rate 18, height _0  (1.778 m), weight 76 kg (167 lb 8.8 oz), SpO2 97.00%.   Assessment/Plan: Maj. depressive disorder, single episode, severe without psychotic features  Recommendation: Case discussed with Modena Jansky, MD Will start fluoxetine 20 mg daily for depression Will start Clonazepam 0.5 mg twice daily for anxiety can be given through NG tube May continue Ativan 0.5 mg intravenous q. 8 hours when necessary for anxiety  Appreciate psychiatric consultation  Psychiatry will follow up with the patient has clinically requires  May contact [phone] if needed further assistance.  Real Cona,JANARDHAHA R. 09/15/2013, 3:31 PM

## 2013-09-15 NOTE — Progress Notes (Signed)
I met with pt, his daughter, and sister at bedside. Pt was on inpt rehab 2/27 through 3/5 when he returned to acute hospital due to medical issues. I will follow his care and plan readmit to inpt rehab if pt agreeable and still needs intense inpt rehab before returning home. He is aware. [phone]

## 2013-09-15 NOTE — Progress Notes (Signed)
Urology Progress Note  Subjective:     No acute urologic events overnight. Had nausea when his NG was shut off. Feeling better today compared to the weekend.  ROS: Positive: RUE/RLE weakness.  Negative: Chest pain.  Objective:  Patient Vitals for the past 24 hrs:  BP Temp Temp src Pulse Resp SpO2 Weight  09/15/13 1352 146/92 mmHg 98.9 F (37.2 C) Oral 97 18 97 % -  09/15/13 1100 153/99 mmHg - - 109 - - -  09/15/13 0602 159/101 mmHg 97.6 F (36.4 C) Oral 100 18 95 % -  09/15/13 0500 - - - - - - 76 kg (167 lb 8.8 oz)  09/15/13 0000 169/101 mmHg - - - - - -  09/14/13 1949 167/103 mmHg 99.5 F (37.5 C) - 98 17 95 % -    Physical Exam: General:  No acute distress, awake Cardiovascular:    [x]   S1/S2 present, Mildly tachycardic.  []   Irregularly irregular Chest:  CTA-B Abdomen:               []  Soft, appropriately TTP  [x]  Soft, Distended. No rebound TTP. Incisions c/d/i without erythema.  []  Soft, appropriately TTP, incision(s) clean/dry/intact  Genitourinary: Foley in place. Clear urine. Negative edema.     I/O last 3 completed shifts: In: 2929.3 [I.V.:1286.3; IV Piggyback:400] Out: 7500 [Urine:1400; Emesis/NG output:4950; Stool:1150]  Recent Labs     09/13/13  0509  09/15/13  0500  HGB  9.7*  10.0*  WBC  9.2  9.3  PLT  320  247    Recent Labs     09/14/13  0516  09/15/13  0500  NA  151*  149*  K  3.5*  4.2  CL  110  109  CO2  30  30  BUN  10  13  CREATININE  0.83  0.80  CALCIUM  8.3*  8.5  GFRNONAA  >90  >90  GFRAA  >90  >90     No results found for this basename: PT, INR, APTT,  in the last 72 hours   No components found with this basename: ABG,     Length of stay: 5 days.  Assessment: UTI. C. Diff colitis.  Robotic radical prostatectomy 08/29/13.   Plan: -Continue conservative management for bowels.   -Given patient's limited mobility, it may be best to keep the foley until he is more mobile.  -D/c ancef for UTI.   Rolan Bucco,  MD [phone]

## 2013-09-15 NOTE — Progress Notes (Signed)
I have seen and examined the patient and agree with the assessment and plans. Continuing NG and NPO  Alexandrya Chim A. Ninfa Linden  MD, FACS

## 2013-09-15 NOTE — Progress Notes (Signed)
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 Now transfered back to acute for ileus vs SBO.   PT Comments   Pt progressing towards physical therapy goals. Reports nausea during session which limited OOB activity, however displayed a good rehab effort. Pt asks to be seen again by PT tomorrow at end of session, and is very motivated to participate.   Follow Up Recommendations  CIR     Does the patient have the potential to tolerate intense rehabilitation     Barriers to Discharge        Equipment Recommendations   (TBD by next venue of care)    Recommendations for Other Services Rehab consult  Frequency Min 4X/week   Progress towards PT Goals Progress towards PT goals: Progressing toward goals  Plan Current plan remains appropriate    Precautions / Restrictions Precautions Precautions: Fall Precaution Comments: multiple lines/tubes. Restrictions Weight Bearing Restrictions: No   Pertinent Vitals/Pain Pt reports no pain throughout session.     Mobility  Bed Mobility Overal bed mobility: Needs Assistance Bed Mobility: Supine to Sit Supine to sit: Mod assist;HOB elevated General bed mobility comments: Assist to bring legs over and trunk up. Transfers Overall transfer level: Needs assistance Equipment used: Rolling walker (2 wheeled) Transfers: Sit to/from Stand Sit to Stand: +2 physical assistance;Mod assist General transfer comment: Pt able to power-up to full stand with +2 assist and remain standing with RW for support. VC's for hand placement on seated surface for safety.     Exercises General Exercises - Lower Extremity Ankle  Circles/Pumps: 10 reps Quad Sets: 15 reps Short Arc Quad: 15 reps Heel Slides: 15 reps Hip ABduction/ADduction: 15 reps Hip Flexion/Marching: 10 reps;Standing Heel Raises: 5 reps;Standing Mini-Sqauts: 5 reps;Standing   PT Diagnosis:    PT Problem List:   PT Treatment Interventions:     PT Goals (current goals can now be found in the care plan section) Acute Rehab PT Goals Patient Stated Goal: recover and get back to being independent PT Goal Formulation: With patient/family Time For Goal Achievement: 09/25/13 Potential to Achieve Goals: Good  Visit Information  Last PT Received On: 09/15/13 Assistance Needed: +2 History of Present Illness: 58 yo male s/p prostatectomy 2/20 for malignant neoplasm and 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 Now transfered back to acute for ileus vs SBO.    Subjective Data  Subjective: Pt supine in bed upon arrival with NG tube, catheter, rectal tube, and IV Patient Stated Goal: recover and get back to being independent   Cognition  Cognition Arousal/Alertness: Awake/alert Behavior During Therapy: WFL for tasks assessed/performed Overall Cognitive Status: Within Functional Limits for tasks assessed    Balance     End of Session PT - End of Session Equipment Utilized During Treatment: Gait belt Activity Tolerance: Patient limited by fatigue Patient left: in bed;with call bell/phone within reach;with family/visitor present Nurse Communication: Mobility status   GP     Jolyn Lent 09/15/2013, 5:15 PM  Jolyn Lent, PT, DPT Acute Rehabilitation Services Pager: [phone]

## 2013-09-15 NOTE — Progress Notes (Signed)
Patient ID: Tanner [name], male   DOB: 19-Sep-1955, 58 y.o.   MRN: [id number]    Subjective: Pt feels ok except some new upper abdominal pain this morning.  Says being off of suction to give medicine causes nausea and makes him hurt until he is put back on suction.  Wants to see a psychiatrist.  Objective: Vital signs in last 24 hours: Temp:  [97.6 F (36.4 C)-99.5 F (37.5 C)] 97.6 F (36.4 C) (03/09 0602) Pulse Rate:  [93-100] 100 (03/09 0602) Resp:  [17-19] 18 (03/09 0602) BP: (159-169)/(101-103) 159/101 mmHg (03/09 0602) SpO2:  [95 %-96 %] 95 % (03/09 0602) Weight:  [167 lb 8.8 oz (76 kg)] 167 lb 8.8 oz (76 kg) (03/09 0500) Last BM Date: 09/14/13  Intake/Output from previous day: 03/08 0701 - 03/09 0700 In: 1361.3 [I.V.:481.3; IV Piggyback:400; TPN:480] Out: 5550 [Urine:1400; Emesis/NG output:3000; Stool:1150] Intake/Output this shift:    PE: Abd: soft, some tenderness in RUQ and epigastrium, few tinkling BS, incisions c/d/i, NGT with some bilious output  Lab Results:   Recent Labs  09/13/13 0509 09/15/13 0500  WBC 9.2 9.3  HGB 9.7* 10.0*  HCT 29.4* 31.1*  PLT 320 247   BMET  Recent Labs  09/14/13 0516 09/15/13 0500  NA 151* 149*  K 3.5* 4.2  CL 110 109  CO2 30 30  GLUCOSE 132* 136*  BUN 10 13  CREATININE 0.83 0.80  CALCIUM 8.3* 8.5   PT/INR No results found for this basename: LABPROT, INR,  in the last 72 hours CMP     Component Value Date/Time   NA 149* 09/15/2013 0500   K 4.2 09/15/2013 0500   CL 109 09/15/2013 0500   CO2 30 09/15/2013 0500   GLUCOSE 136* 09/15/2013 0500   BUN 13 09/15/2013 0500   CREATININE 0.80 09/15/2013 0500   CALCIUM 8.5 09/15/2013 0500   PROT 5.7* 09/15/2013 0500   ALBUMIN 2.3* 09/15/2013 0500   AST 16 09/15/2013 0500   ALT 14 09/15/2013 0500   ALKPHOS 40 09/15/2013 0500   BILITOT <0.2* 09/15/2013 0500   GFRNONAA >90 09/15/2013 0500   GFRAA >90 09/15/2013 0500   Lipase     Component Value Date/Time   LIPASE 14 09/06/2013 1040        Studies/Results: Dg Abd Portable 1v  09/14/2013   CLINICAL DATA:  Small bowel obstruction.  EXAM: PORTABLE ABDOMEN - 1 VIEW  COMPARISON:  DG ABD PORTABLE 1V dated 09/13/2013; DG ABD PORTABLE 1V dated 09/13/2013; DG ABD 1 VIEW dated 09/12/2013; DG ABD PORTABLE 1V dated 09/11/2013; CT ABD/PELVIS W CM dated 09/11/2013  FINDINGS: Small bowel dilatation shows some worsening since the most recent radiograph with maximal small bowel caliber of 5.5 cm in the central abdomen. Overall pattern is improved since 3/5. Nasogastric tube extends into the proximal stomach.  IMPRESSION: Slight interval worsening of small bowel dilatation since 3/7. Overall pattern shows improvement since 03/05.   Electronically Signed   By: Aletta Edouard M.D.   On: 09/14/2013 09:05   Dg Abd Portable 1v  09/13/2013   CLINICAL DATA:  Check nasogastric tube placement.  EXAM: PORTABLE ABDOMEN - 1 VIEW  COMPARISON:  KUB from earlier the same day at 9:09 a.m.  FINDINGS: Nasogastric tube enters the proximal stomach, unchanged from before. The side port is near the level is GE junction. Previously seen dilated segment of bowel in the right abdomen no longer visible, which may be related to improvement or differences in patient positioning. Lung bases  clear.  IMPRESSION: Unchanged positioning of nasogastric tube, with side port at the GE junction. This study does not exclude coiling in the chest or neck; consider imaging of these levels if there is ongoing concern for tube redundancy.   Electronically Signed   By: Jorje Guild M.D.   On: 09/13/2013 20:16   Dg Abd Portable 1v  09/13/2013   CLINICAL DATA:  Evaluate for ileus.  Abdominal distention.  EXAM: PORTABLE ABDOMEN - 1 VIEW  COMPARISON:  09/12/2013  FINDINGS: NG tube is in the proximal stomach. Rectal tube projects over the lower pelvis. Mild gaseous distention of a bowel loop in the right abdomen. This is likely a portion of the colon, likely proximal transverse colon or cecum. Overall  gaseous distention of bowel has improved since prior study.  IMPRESSION: Mild continued gaseous distention of a single colonic segment in the right abdomen. Overall gaseous distension of bowel has improved.   Electronically Signed   By: Rolm Baptise M.D.   On: 09/13/2013 10:34    Anti-infectives: Anti-infectives   Start     Dose/Rate Route Frequency Ordered Stop   09/11/13 2200  ceFAZolin (ANCEF) IVPB 1 g/50 mL premix     1 g 100 mL/hr over 30 Minutes Intravenous 3 times per day 09/11/13 1758     09/11/13 1200  vancomycin (VANCOCIN) 50 mg/mL oral solution 500 mg     500 mg Per Tube 4 times per day 09/11/13 1159     09/11/13 1000  metroNIDAZOLE (FLAGYL) IVPB 500 mg     500 mg 100 mL/hr over 60 Minutes Intravenous Every 8 hours 09/11/13 0932     09/10/13 2230  cefTRIAXone (ROCEPHIN) 1 g in dextrose 5 % 50 mL IVPB  Status:  Discontinued     1 g 100 mL/hr over 30 Minutes Intravenous Every 24 hours 09/10/13 2145 09/11/13 1758       Assessment/Plan  1. Ileus, multifactorial Patient Active Problem List   Diagnosis Date Noted  . C. difficile colitis 09/11/2013  . UTI (urinary tract infection) 09/11/2013  . Essential hypertension, benign 09/11/2013  . Ileus 09/10/2013  . Spastic hemiplegia affecting dominant side 09/09/2013  . Ileus, postoperative 09/09/2013  . Hypokalemia 09/09/2013  . Nonspecific (abnormal) findings on radiological and other examination of gastrointestinal tract 09/09/2013  . CVA (cerebral infarction) 09/05/2013  . 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 Malignant neoplasm of prostate 08/29/2013  . Prostate cancer 05/22/2013  . Hyponatremia 12/11/2012    Class: Chronic  . Ankle fracture 12/10/2012   Plan: 1. Cont NPO and NGT.  Continue current conservative management. 2. No surgical intervention needed 3. Cont to keep K above 4. 4. Follow abdominal pain   LOS: 5 days    Tanner [name] 09/15/2013, 7:58 AM Pager: [phone]

## 2013-09-15 NOTE — Progress Notes (Signed)
Daily Rounding Note  09/15/2013, 8:53 AM  LOS: 5 days   SUBJECTIVE:       Feels depressed, to be seen by psychiatrist. Beulah Gandy about the same, no significant pain.  Stools still loose.  Nausea noted when NGT clamped    OBJECTIVE:         Vital signs in last 24 hours:    Temp:  [97.6 F (36.4 C)-99.5 F (37.5 C)] 97.6 F (36.4 C) (03/09 0602) Pulse Rate:  [93-100] 100 (03/09 0602) Resp:  [17-19] 18 (03/09 0602) BP: (159-169)/(101-103) 159/101 mmHg (03/09 0602) SpO2:  [95 %-96 %] 95 % (03/09 0602) Weight:  [76 kg (167 lb 8.8 oz)] 76 kg (167 lb 8.8 oz) (03/09 0500) Last BM Date: 09/14/13 General: looks pale, unwell,no change   Heart: RRR Chest: clear.  No cough or dyspnea Abdomen: soft but distended, come tinkling BS, NT.  NGT with brownish bilious output. Extremities: no CCE.  Neuro/Psych:  Affect blunted, alert/oriented x 3.    Intake/Output from previous day: 03/08 0701 - 03/09 0700 In: 1361.3 [I.V.:481.3; IV Piggyback:400; TPN:480] Out: 5550 [Urine:1400; Emesis/NG output:3000; OBSJG:2836]  Intake/Output this shift:    Lab Results:  Recent Labs  09/13/13 0509 09/15/13 0500  WBC 9.2 9.3  HGB 9.7* 10.0*  HCT 29.4* 31.1*  PLT 320 247   BMET  Recent Labs  09/13/13 0509 09/14/13 0516 09/15/13 0500  NA 144 151* 149*  K 3.4* 3.5* 4.2  CL 106 110 109  CO2 27 30 30   GLUCOSE 124* 132* 136*  BUN 10 10 13   CREATININE 0.81 0.83 0.80  CALCIUM 8.3* 8.3* 8.5   LFT  Recent Labs  09/13/13 0509 09/15/13 0500  PROT 5.6* 5.7*  ALBUMIN 2.3* 2.3*  AST 24 16  ALT 27 14  ALKPHOS 49 40  BILITOT 0.2* <0.2*    Studies/Results: Dg Abd Portable 1v 09/14/2013    FINDINGS: Small bowel dilatation shows some worsening since the most recent radiograph with maximal small bowel caliber of 5.5 cm in the central abdomen. Overall pattern is improved since 3/5. Nasogastric tube extends into the proximal stomach.   IMPRESSION: Slight interval worsening of small bowel dilatation since 3/7. Overall pattern shows improvement since 03/05.   Electronically Signed   By: Aletta Edouard M.D.   On: 09/14/2013 09:05   Dg Abd Portable 1v 09/13/2013   FINDINGS: Nasogastric tube enters the proximal stomach, unchanged from before. The side port is near the level is GE junction. Previously seen dilated segment of bowel in the right abdomen no longer visible, which may be related to improvement or differences in patient positioning. Lung bases clear.  IMPRESSION: Unchanged positioning of nasogastric tube, with side port at the GE junction. This study does not exclude coiling in the chest or neck; consider imaging of these levels if there is ongoing concern for tube redundancy.   Electronically Signed   By: Jorje Guild M.D.   On: 09/13/2013 20:16    ASSESMENT:   *  PSBO vs ileus.  Surgery feels he does not require surgery.  Remains on NGT, TNA, rectal tube. LFTs normal  *  C diff.  Day 5 IV Flagyl/per tube Vanc.  *  Hypokalemia, corrected finally.   *  E coli UTI.  Day 5 Ancef.   *  Prostatectomy 08/29/13  *  Post op CVA, right hemiparesis improving.   *  normocytic anemia.     PLAN   *  Supportive care.  Continue NGT, rectal tube, TPN. Tanner [name]  09/15/2013, 8:53 AM  Attending MD note:   I have taken a history, examined the patient, and reviewed the chart. I agree with the Advanced Practitioner's impression and recommendations. NG output is actually down and the stool output has increased, normal bowl sounds are consistent with an adynamic ileus rather than mechanical obstruction. We will repeat KUB in am. Vanco and Flagyl IV  [facility] Pager # [phone]  Pager: [phone]

## 2013-09-15 NOTE — Progress Notes (Signed)
PARENTERAL NUTRITION CONSULT NOTE - FOLLOW UP  Pharmacy Consult:  TPN Indication:  Prolonged ileus vs SBO  Allergies  Allergen Reactions  . Dilaudid [Hydromorphone Hcl] Nausea And Vomiting    Pre pt history  . Oxycodone Nausea And Vomiting  . Procaine Hcl Other (See Comments)    Patient states he is allergic to novicaine  . Zofran [Ondansetron] Nausea And Vomiting  . Sulfa Antibiotics Rash    Patient Measurements: Height: 5\' 10"  (177.8 cm) Weight: 167 lb 8.8 oz (76 kg) IBW/kg (Calculated) : 73  Vital Signs: Temp: 97.6 F (36.4 C) (03/09 0602) Temp src: Oral (03/09 0602) BP: 159/101 mmHg (03/09 0602) Pulse Rate: 100 (03/09 0602) Intake/Output from previous day: 03/08 0701 - 03/09 0700 In: 1361.3 [I.V.:481.3; IV Piggyback:400; TPN:480] Out: 5550 [Urine:1400; Emesis/NG output:3000; XHBZJ:6967]  Labs:  Recent Labs  09/13/13 0509 09/15/13 0500  WBC 9.2 9.3  HGB 9.7* 10.0*  HCT 29.4* 31.1*  PLT 320 247     Recent Labs  09/13/13 0509 09/14/13 0516 09/15/13 0500  NA 144 151* 149*  K 3.4* 3.5* 4.2  CL 106 110 109  CO2 27 30 30   GLUCOSE 124* 132* 136*  BUN 10 10 13   CREATININE 0.81 0.83 0.80  CALCIUM 8.3* 8.3* 8.5  MG 2.0 2.0 2.1  PHOS 2.4 3.8 3.9  PROT 5.6*  --  5.7*  ALBUMIN 2.3*  --  2.3*  AST 24  --  16  ALT 27  --  14  ALKPHOS 49  --  40  BILITOT 0.2*  --  <0.2*  PREALBUMIN 11.6*  --   --   TRIG 140  --  183*   Estimated Creatinine Clearance: 107.7 ml/min (by C-G formula based on Cr of 0.8).    Recent Labs  09/14/13 2001 09/15/13 0029 09/15/13 0556  GLUCAP 134* 141* 130*     Insulin Requirements in the past 24 hours:  3 units sensitive SSI for CBGs 113-141 last 24h  Assessment: 20 YOM admitted on 08/29/13 for work-up of prostate cancer and underwent radical robotic prostatectomy.  He was diagnosed with a new CVA post-op as well as right ICA aneurysm.  Discharged to Rehab on 09/06/13 and was later found to have an ileus requiring NGT for  decompression.  Returned to the acute care services on 09/10/13 for further management.  Pharmacy consulted to manage TPN due to prolonged ileus vs SBO.  GI: hx GERD. +C.diff colitis.Baseline prealbumin low at 11.6. NG output 3000/24h (down from 5250). Stool output 1150. Patient is not currently on a probiotic due to inability to take po's. KUB: Slight interval worsening of small bowel dilatation since 3/7. Overall pattern shows improvement since 03/05. Still having upper abdominal pain this AM.  Endo: no hx DM, A1c 5% - CBGs controlled <150 on SSI.  Lytes: Hypokalemia replaced to 4.2 today with goal K >/= 4 for ileus), hypernatremia with Na 151>>149 today. Will monitor in IVF (none currently)  Renal: SCr 0.8 (stable), CrCL 121ml/min, UOP 0.8. Plain film cystogram once contrast clears to ensure no urine leak.    Cards: HTN - BP elevated at 159/101, HR 100 - IV Lopressor, ASA 325 po?, IV Hydralazine prn.  Hepatobil: EtOH hepatitis - LFTs / tbili WNL. Triglycerides have increased slightly from 140>>183 today. Will watch.  Neuro: anxiety / recent CVA - on ASA 325 po. 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  ID: Ancef for E.coli UTI + Flagyl/PO Vanc for +C.diff, afebrile, WBC 9.3. Tmax 99.5.  Best Practices:  Lovenox  TPN Access:  PICC placed 09/09/13  TPN day#: 3 (3/6 >> )  Current Nutrition:  TPN  Nutritional Goals:  1800-1900 kCal, 95-110 grams of protein per day   Plan:  - Clinimix E 5/15 to 80 ml/hr and continue lipids to 10 ml/hr.  TPN to provide 1843 kCal and 96 gm protein daily. - Daily IV multivitamin, folic acid, and thiamine in TPN - Trace elements on MWF only d/t national shortage - Continue sensitive SSI Q6H.  D/C CBG checks/SSI if CBGs remain controlled at goal TPN rate - F/U anti-hypertensive dosage adjustment for better BP control - F/U prealbumin - If patient can tolerate po ASA, can a probiotic such as Florastor be added for Cdiff?    Barbette Mcglaun S. Alford Highland, PharmD,  [facility] Clinical Staff Pharmacist Pager [phone] 09/15/2013, 7:54 AM

## 2013-09-16 ENCOUNTER — Inpatient Hospital Stay (HOSPITAL_COMMUNITY): Payer: Medicaid Other

## 2013-09-16 LAB — BASIC METABOLIC PANEL
BUN: 17 mg/dL (ref 6–23)
CHLORIDE: 103 meq/L (ref 96–112)
CO2: 31 mEq/L (ref 19–32)
Calcium: 8.5 mg/dL (ref 8.4–10.5)
Creatinine, Ser: 0.86 mg/dL (ref 0.50–1.35)
GFR calc Af Amer: >90 mL/min (ref >90)
Glucose, Bld: 135 mg/dL — ABNORMAL HIGH (ref 70–99)
Potassium: 3.8 mEq/L (ref 3.7–5.3)
Sodium: 144 mEq/L (ref 137–147)

## 2013-09-16 LAB — GLUCOSE, CAPILLARY
GLUCOSE-CAPILLARY: 135 mg/dL — AB (ref 70–99)
GLUCOSE-CAPILLARY: 126 mg/dL — AB (ref 70–99)
Glucose-Capillary: 122 mg/dL — ABNORMAL HIGH (ref 70–99)
Glucose-Capillary: 128 mg/dL — ABNORMAL HIGH (ref 70–99)

## 2013-09-16 MED ORDER — FAT EMULSION 20 % IV EMUL
250.0000 mL | INTRAVENOUS | Status: AC
  Filled 2013-09-16: qty 250

## 2013-09-16 MED ORDER — THIAMINE HCL 100 MG/ML IJ SOLN
INTRAVENOUS | Status: AC
  Administered 2013-09-16: 18:00:00 via INTRAVENOUS
  Filled 2013-09-16: qty 2000

## 2013-09-16 MED ORDER — FAMOTIDINE IN NACL 20-0.9 MG/50ML-% IV SOLN
20.0000 mg | Freq: Two times a day (BID) | INTRAVENOUS | Status: DC
  Administered 2013-09-16 – 2013-09-20 (×9): 20 mg via INTRAVENOUS
  Filled 2013-09-16 (×13): qty 50

## 2013-09-16 MED ORDER — SODIUM CHLORIDE 0.45 % IV SOLN
INTRAVENOUS | Status: AC
  Administered 2013-09-16: 17:00:00 via INTRAVENOUS

## 2013-09-16 MED ORDER — FAT EMULSION 20 % IV EMUL
250.0000 mL | INTRAVENOUS | Status: DC
  Administered 2013-09-16: 250 mL via INTRAVENOUS
  Filled 2013-09-16: qty 250

## 2013-09-16 NOTE — Progress Notes (Signed)
I have seen and examined the patient and agree with the assessment and plans. His output is going down but I still think he needs the NG more  Maalle Starrett A. Ninfa Linden  MD, FACS

## 2013-09-16 NOTE — Progress Notes (Signed)
PARENTERAL NUTRITION CONSULT NOTE - FOLLOW UP  Pharmacy Consult:  TPN Indication:  Prolonged ileus vs SBO  Allergies  Allergen Reactions  . Dilaudid [Hydromorphone Hcl] Nausea And Vomiting    Pre pt history  . Oxycodone Nausea And Vomiting  . Procaine Hcl Other (See Comments)    Patient states he is allergic to novicaine  . Zofran [Ondansetron] Nausea And Vomiting  . Sulfa Antibiotics Rash    Patient Measurements: Height: 5\' 10"  (177.8 cm) Weight: 167 lb 8.8 oz (76 kg) IBW/kg (Calculated) : 73  Vital Signs: Temp: 98.3 F (36.8 C) (03/10 0437) Temp src: Oral (03/10 0437) BP: 160/98 mmHg (03/10 0437) Pulse Rate: 13 (03/10 0437) Intake/Output from previous day: 03/09 0701 - 03/10 0700 In: 1000 [NG/GT:1000] Out: 1650 [Urine:450; Emesis/NG output:1200]  Labs:  Recent Labs  09/15/13 0500  WBC 9.3  HGB 10.0*  HCT 31.1*  PLT 247     Recent Labs  09/14/13 0516 09/15/13 0500 09/16/13 0431  NA 151* 149* 144  K 3.5* 4.2 3.8  CL 110 109 103  CO2 30 30 31   GLUCOSE 132* 136* 135*  BUN 10 13 17   CREATININE 0.83 0.80 0.86  CALCIUM 8.3* 8.5 8.5  MG 2.0 2.1  --   PHOS 3.8 3.9  --   PROT  --  5.7*  --   ALBUMIN  --  2.3*  --   AST  --  16  --   ALT  --  14  --   ALKPHOS  --  40  --   BILITOT  --  <0.2*  --   PREALBUMIN  --  12.2*  --   TRIG  --  183*  --    Estimated Creatinine Clearance: 100.2 ml/min (by C-G formula based on Cr of 0.86).    Recent Labs  09/15/13 1705 09/16/13 09/16/13 0633  GLUCAP 121* 128* 122*     Insulin Requirements in the past 24 hours:  5 units sensitive SSI for CBGs 121-144 last 24h  Assessment: 22 YOM admitted on 08/29/13 for work-up of prostate cancer and underwent radical robotic prostatectomy.  He was diagnosed with a new CVA post-op as well as right ICA aneurysm.  Discharged to Rehab on 09/06/13 and was later found to have an ileus requiring NGT for decompression.  Returned to the acute care services on 09/10/13 for further  management.  Pharmacy consulted to manage TPN due to prolonged ileus vs SBO.  GI: hx GERD. +C.diff colitis. Baseline prealbumin low at 11.6 now 12.2. NG output 525-??3000>>1200. Stool output 1150>>none recorded. Patient is not currently on a probiotic due to inability to take po's. KUB: Slight interval worsening of small bowel dilatation since 3/7. Overall pattern shows improvement since 03/05. Still having upper abdominal pain. Still has some nausea with NG clamping. No surgery planned  Endo: no hx DM, A1c 5% - CBGs controlled <150 on SSI.  Lytes: Hypokalemia 3.8 today with goal K >/= 4 for ileus), hypernatremia with Na 151>>149>>144.  Renal: SCr 0.8 (stable), CrCL 100, UOP 0.2. Plain film cystogram once contrast clears to ensure no urine leak.    Cards: HTN - BP elevated at 160/98, HR 13-109? - IV Lopressor, ASA 325 po?, IV Hydralazine prn.  Hepatobil: EtOH hepatitis - LFTs / tbili WNL. Triglycerides have increased slightly from 140>>183 today. Will watch.  Neuro: anxiety / recent CVA - on ASA 325 po. 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 Depressed  ID: Ancef for E.coli UTI + Flagyl/PO Vanc for +C.diff,  afebrile, WBC 9.3. Afebrile  Best Practices: Lovenox  TPN Access:  PICC placed 09/09/13  TPN day#: 4 (3/6 >> )  Current Nutrition:  TPN  Nutritional Goals:  1800-1900 kCal, 95-110 grams of protein per day   Plan:  - Clinimix E 5/15 to 80 ml/hr and continue lipids to 10 ml/hr.  TPN to provide 1843 kCal and 96 gm protein daily. - Daily IV multivitamin, folic acid, and thiamine in TPN - Trace elements on MWF only d/t national shortage - Continue CBGs but d/c SSI and monitor. - If patient can tolerate po ASA, can a probiotic such as Florastor be added for Cdiff? - No change to TPN today 3/10.    Savahna Casados S. Alford Highland, PharmD, [place] Clinical Staff Pharmacist Pager [phone] 09/16/2013, 10:20 AM

## 2013-09-16 NOTE — Progress Notes (Addendum)
NUTRITION FOLLOW UP  DOCUMENTATION CODES Per approved criteria  -Severe malnutrition in the context of acute illness or injury   INTERVENTION:  TPN per pharmacy RD to follow for nutrition care plan  NUTRITION DIAGNOSIS: Inadequate oral intake related to altered GI function as evidenced by NPO status, ongoing  Goal: Pt to meet >/= 90% of their estimated nutrition needs, met  Monitor:  TPN prescription, PO diet advancement & intake, weight, labs, I/O's  ASSESSMENT: 58 y.o. male with PMH of prostate cancer and hypertension who has had recent prostatectomy following which patient had developed right-sided hemiplegia and was found to have acute CVA and was eventually transferred to rehabilitation where patient started developing postoperative ileus. Gastroenterologist was consulted and patient's narcotics were stopped and patient was placed on NG tube and rectal tube.   Patient also was started on IV Reglan and was also found to have hypokalemia. Patient also was found to be febrile and urine cultures Escherichia coli. Since patient's status did not improve and needed further acute care patient was transferred to [facility] cone from the rehabilitation from fourth floor. Patient on my exam denies any chest pain shortness of breath. Has distended abdomen. Has no abdominal pain at this time. Denies any fever chills. Is alert awake oriented.   Patient remains NPO.  NGT clamped.  Abdominal pain better; occasional nausea.  Spoke with Donella Stade, PharmD regarding tonight's TPN prescription.    Patient to receive TPN via PICC line with Clinimix E 5/15 @ 83 ml/hr and lipids @ 10 ml/hr. Provides 1894 kcal and 100 grams protein per day. Meets 90% minimum estimated energy needs and 91% minimum estimated protein needs.  Mg, Phos, K+ WNL.  Height: Ht Readings from Last 1 Encounters:  09/10/13 5' 10"  (1.778 m)    Weight: Wt Readings from Last 1 Encounters:  09/15/13 167 lb 8.8 oz (76 kg)    BMI:   Body mass index is 24.04 kg/(m^2).  Re-estimated nutrition needs: Kcal: 2100-2300 Protein: 110-120 gm Fluid: 2.1-2.3 L  Skin: Intact  Diet Order: NPO   Intake/Output Summary (Last 24 hours) at 09/16/13 1340 Last data filed at 09/16/13 0807  Gross per 24 hour  Intake   1000 ml  Output   2400 ml  Net  -1400 ml    Labs:   Recent Labs Lab 09/13/13 0509 09/14/13 0516 09/15/13 0500 09/16/13 0431  NA 144 151* 149* 144  K 3.4* 3.5* 4.2 3.8  CL 106 110 109 103  CO2 27 30 30 31   BUN 10 10 13 17   CREATININE 0.81 0.83 0.80 0.86  CALCIUM 8.3* 8.3* 8.5 8.5  MG 2.0 2.0 2.1  --   PHOS 2.4 3.8 3.9  --   GLUCOSE 124* 132* 136* 135*    CBG (last 3)   Recent Labs  09/16/13 09/16/13 0633 09/16/13 1202  GLUCAP 128* 122* 135*    Scheduled Meds: . aspirin  325 mg Oral Daily  .  ceFAZolin (ANCEF) IV  1 g Intravenous 3 times per day  . clonazePAM  0.5 mg Oral BID  . enoxaparin (LOVENOX) injection  40 mg Subcutaneous QHS  . famotidine (PEPCID) IV  20 mg Intravenous Q12H  . FLUoxetine  20 mg Oral Daily  . metoprolol  5 mg Intravenous 4 times per day  . metronidazole  500 mg Intravenous Q8H  . vancomycin  500 mg Per Tube 4 times per day    Continuous Infusions: . Marland KitchenTPN (CLINIMIX-E) Adult 80 mL/hr at 09/15/13 1721  And  . fat emulsion 250 mL (09/15/13 1721)  . Marland KitchenTPN (CLINIMIX-E) Adult      Past Medical History  Diagnosis Date  . Alcoholic hepatitis   . Hypertension   . 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 Shortness of breath     new onset- occasionally-at rest and exertion  . GERD (gastroesophageal reflux disease)   . Anxiety   . TIA (transient ischemic attack) 08/2013  . Arthritis     " in my spine "  . Ileus 09/2013    Past Surgical History  Procedure Laterality Date  . Hand surgery Right   . Hernia repair    . Orif ankle fracture Right 12/10/2012    Procedure: OPEN REDUCTION INTERNAL FIXATION (ORIF) ANKLE FRACTURE;  Surgeon: Hessie Dibble, MD;   Location: [facility];  Service: Orthopedics;  Laterality: Right;  . Robot assisted laparoscopic radical prostatectomy N/A 08/29/2013    Procedure: ROBOTIC ASSISTED LAPAROSCOPIC RADICAL PROSTATECTOMY LEVEL 2, Lysis of adhesions;  Surgeon: Molli Hazard, MD;  Location: [facility];  Service: Urology;  Laterality: N/A;  . Lymphadenectomy Bilateral 08/29/2013    Procedure: LYMPHADENECTOMY "BILATERAL PELVIC LYMPH NODE DISSECTION";  Surgeon: Molli Hazard, MD;  Location: [facility];  Service: Urology;  Laterality: Bilateral;    Arthur Holms, RD, LDN Pager #: [phone] After-Hours Pager #: [phone]

## 2013-09-16 NOTE — Progress Notes (Signed)
Urology Progress Note  Subjective:     No acute urologic events overnight. Some nausea today. Positive feeling like he needs to have a BM.   ROS: Positive: RUE/RLE weakness.  Negative: Chest pain.  Objective:  Patient Vitals for the past 24 hrs:  BP Temp Temp src Pulse Resp SpO2  09/16/13 0900 153/96 mmHg - - 103 - -  09/16/13 0437 160/98 mmHg 98.3 F (36.8 C) Oral 13 18 96 %  09/15/13 2007 150/99 mmHg 97.6 F (36.4 C) Oral 102 18 95 %    Physical Exam: General:  No acute distress, awake Cardiovascular:    [x]   S1/S2 present, Mildly tachycardic.  []   Irregularly irregular Chest:  CTA-B Abdomen:               []  Soft, appropriately TTP  [x]  Soft, Distended. No rebound TTP. Incisions c/d/i without erythema.  []  Soft, appropriately TTP, incision(s) clean/dry/intact  Genitourinary: Foley in place. Clear urine. Negative edema.     I/O last 3 completed shifts: In: 1000 [NG/GT:1000] Out: 4300 [Urine:1350; Emesis/NG output:2200; Stool:750]  Recent Labs     09/15/13  0500  HGB  10.0*  WBC  9.3  PLT  247    Recent Labs     09/15/13  0500  09/16/13  0431  NA  149*  144  K  4.2  3.8  CL  109  103  CO2  30  31  BUN  13  17  CREATININE  0.80  0.86  CALCIUM  8.5  8.5  GFRNONAA  >90  >90  GFRAA  >90  >90     No results found for this basename: PT, INR, APTT,  in the last 72 hours   No components found with this basename: ABG,     Length of stay: 6 days.  Assessment: UTI. C. Diff colitis.  Robotic radical prostatectomy 08/29/13.   Plan: -Continue conservative management for bowels.   -Continue foley.  -Will follow   Rolan Bucco, MD [phone]

## 2013-09-16 NOTE — Progress Notes (Signed)
          Daily Rounding Note  09/16/2013, 9:00 AM  LOS: 6 days   SUBJECTIVE:       1000 cc NGT output recorded yesterday. 750 cc of liquid stool per flexiseal.  C/o burning pain across chest.  Feels like heart burn.   OBJECTIVE:         Vital signs in last 24 hours:    Temp:  [97.6 F (36.4 C)-98.9 F (37.2 C)] 98.3 F (36.8 C) (03/10 0437) Pulse Rate:  [13-109] 13 (03/10 0437) Resp:  [18] 18 (03/10 0437) BP: (146-160)/(92-99) 160/98 mmHg (03/10 0437) SpO2:  [95 %-97 %] 96 % (03/10 0437) Last BM Date: 09/14/13 General: looks unwell, pale.  Overall a little better   Heart: RRR Chest: clear bil.  No rales.  Breathing easily Abdomen: soft, ND, NT.  BS hypoactive.  Scars intact.  Green, liquid stool in flexiseal.  Reddish brown liquid in NG canister.  Extremities: no cce Neuro/Psych:  Flat affect.  Easily aroused but prefers to have eyes closed and rest.    Intake/Output from previous day: 03/09 0701 - 03/10 0700 In: 1000 [NG/GT:1000] Out: 1650 [Urine:450; Emesis/NG output:1200]  Intake/Output this shift: Total I/O In: -  Out: 750 [Emesis/NG output:750]  Lab Results:  Recent Labs  09/15/13 0500  WBC 9.3  HGB 10.0*  HCT 31.1*  PLT 247   BMET  Recent Labs  09/14/13 0516 09/15/13 0500 09/16/13 0431  NA 151* 149* 144  K 3.5* 4.2 3.8  CL 110 109 103  CO2 30 30 31   GLUCOSE 132* 136* 135*  BUN 10 13 17   CREATININE 0.83 0.80 0.86  CALCIUM 8.3* 8.5 8.5   LFT  Recent Labs  09/15/13 0500  PROT 5.7*  ALBUMIN 2.3*  AST 16  ALT 14  ALKPHOS 40  BILITOT <0.2*    ASSESMENT:   * PSBO vs ileus. Surgery feels he does not require surgery.  Remains on NGT, TNA, rectal tube. LFTs normal  * C diff. Day 6 IV Flagyl/per tube Vanc.  * Hypokalemia, corrected finally.  * E coli UTI. Day 6 Ancef.  * Prostatectomy 08/29/13  * Post op CVA, right hemiparesis improving.  * normocytic anemia.  *  Hx etoh  abuse/alcoholism?: 8 to 10 beers daily PTA. Normal liver on CT 3/5 *  Depression.  Psychiatry saw pt yest and started Clonazepam and prn ativan and fluoxetine.      PLAN   *  Dr Nedra Hai feels it is not prudent to clamp NGT.  See the note.  *  Add Pepcid (try to avoid Protonix in pt with C diff) . Also getting him into upright position would help reflux.  *  Finish the Ancef ASAP.  Would think 7 days will be sufficient.      Tanner [name]  09/16/2013, 9:00 AM Pager: [phone] Attending MD note:   I have taken a history, examined the patient, and reviewed the chart. I agree with the Advanced Practitioner's impression and recommendations. Abdomen is markedly decompressed and NG output decreased. KUB still worrisome for SBO. Pt on cTNA. C.Diff. Will continue NG suction, and follow KUB's. K+,  Melburn Popper [facility] Pager # [phone]

## 2013-09-16 NOTE — Progress Notes (Signed)
Subjective: Patient feels ok today.  Still has a little abdominal pain, but better.  Occasional nausea with NGT clamped  Objective: Vital signs in last 24 hours: Temp:  [97.6 F (36.4 C)-98.9 F (37.2 C)] 98.3 F (36.8 C) 2022/10/12 0437) Pulse Rate:  [13-109] 13 Oct 12, 2022 0437) Resp:  [18] 18 2022-10-12 0437) BP: (146-160)/(92-99) 160/98 mmHg 2022-10-12 0437) SpO2:  [95 %-97 %] 96 % October 12, 2022 0437) Last BM Date: 09/14/13  Intake/Output from previous day: 03/09 0701 - 2022-10-12 0700 In: 1000 [NG/GT:1000] Out: 1650 [Urine:450; Emesis/NG output:1200] Intake/Output this shift: Total I/O In: -  Out: 750 [Emesis/NG output:750]  PE: Abd: soft, less tender in upper abdomen, very hypoactive BS, NGT with bilious output.  flexi-seal with watery output  Lab Results:   Recent Labs  09/15/13 0500  WBC 9.3  HGB 10.0*  HCT 31.1*  PLT 247   BMET  Recent Labs  09/15/13 0500 October 11, 2013 0431  NA 149* 144  K 4.2 3.8  CL 109 103  CO2 30 31  GLUCOSE 136* 135*  BUN 13 17  CREATININE 0.80 0.86  CALCIUM 8.5 8.5   PT/INR No results found for this basename: LABPROT, INR,  in the last 72 hours CMP     Component Value Date/Time   NA 144 10-11-2013 0431   K 3.8 10-11-2013 0431   CL 103 10-11-2013 0431   CO2 31 11-Oct-2013 0431   GLUCOSE 135* 11-Oct-2013 0431   BUN 17 10/11/13 0431   CREATININE 0.86 11-Oct-2013 0431   CALCIUM 8.5 10/11/13 0431   PROT 5.7* 09/15/2013 0500   ALBUMIN 2.3* 09/15/2013 0500   AST 16 09/15/2013 0500   ALT 14 09/15/2013 0500   ALKPHOS 40 09/15/2013 0500   BILITOT <0.2* 09/15/2013 0500   GFRNONAA >90 2013-10-11 0431   GFRAA >90 October 11, 2013 0431   Lipase     Component Value Date/Time   LIPASE 14 09/06/2013 1040       Studies/Results: Dg Abd 2 Views  11-Oct-2013   CLINICAL DATA:  Abdominal pain, evaluate for small bowel obstruction versus ileus  EXAM: ABDOMEN - 2 VIEW  COMPARISON:  DG ABD PORTABLE 1V dated 09/14/2013; DG ABD PORTABLE 1V dated 09/13/2013; DG ABD 1 VIEW dated 09/12/2013; DG  ABD PORTABLE 1V dated 09/13/2013; CT ABD/PELVIS W CM dated 09/11/2013  FINDINGS: There is mild gaseous distention of several rather featureless appearing loops of small bowel with index loop within the left upper abdominal quadrant measuring approximately 4.4 cm in diameter, previously, 5.6 cm. There is a minimal amount of distal rectal gas. Otherwise, there is a conspicuous paucity of distal colonic gas.  No pneumoperitoneum.  No pneumatosis or portal venous gas.  No definite abnormal intra-abdominal calcifications. A presumed rectal tube overlies the lower pelvis. The enteric tube tip and side port overlie the gastric fundus  No acute osseus abnormalities.  IMPRESSION: Overall findings most suggestive of minimally improved though persistent small bowel obstruction. Continued attention on follow-up is recommended.   Electronically Signed   By: Sandi Mariscal M.D.   On: 11-Oct-2013 09:34    Anti-infectives: Anti-infectives   Start     Dose/Rate Route Frequency Ordered Stop   09/11/13 2200  ceFAZolin (ANCEF) IVPB 1 g/50 mL premix     1 g 100 mL/hr over 30 Minutes Intravenous 3 times per day 09/11/13 1758     09/11/13 1200  vancomycin (VANCOCIN) 50 mg/mL oral solution 500 mg     500 mg Per Tube 4 times per day 09/11/13 1159  09/11/13 1000  metroNIDAZOLE (FLAGYL) IVPB 500 mg     500 mg 100 mL/hr over 60 Minutes Intravenous Every 8 hours 09/11/13 0932     09/10/13 2230  cefTRIAXone (ROCEPHIN) 1 g in dextrose 5 % 50 mL IVPB  Status:  Discontinued     1 g 100 mL/hr over 30 Minutes Intravenous Every 24 hours 09/10/13 2145 09/11/13 1758       Assessment/Plan  1. Ileus 2. c diff colitis 3. cVA 4. S/p prostatectomy 5. PCM/TNA  Plan: 1. NGT output is decreasing, but still with high output.  Would contin NPO and NGT.  Films show some improvement today.  Hopefully, may be able to try clamping trials soon. 2. Cont TNA   LOS: 6 days    Ivylynn Hoppes E 09/16/2013, 9:50 AM Pager: [phone]

## 2013-09-16 NOTE — Progress Notes (Signed)
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 Now transfered back to acute for ileus vs SBO.   PT Comments   Noticeable improvement in LE control.  Gait still hemiparetic in nature, but w/shift has improved to get good clearance at swing through.     Follow Up Recommendations  CIR     Does the patient have the potential to tolerate intense rehabilitation     Barriers to Discharge        Equipment Recommendations   (TBD by next venue of care)    Recommendations for Other Services Rehab consult  Frequency Min 4X/week   Progress towards PT Goals Progress towards PT goals: Progressing toward goals  Plan Current plan remains appropriate    Precautions / Restrictions Precautions Precautions: Fall Precaution Comments: multiple lines/tubes. Restrictions Weight Bearing Restrictions: No   Pertinent Vitals/Pain     Mobility  Bed Mobility Overal bed mobility: Needs Assistance Bed Mobility: Supine to Sit Supine to sit: Mod assist;HOB elevated Sit to supine: Mod assist General bed mobility comments: min assist for bridging to EOB; truncal assist Transfers Overall transfer level: Needs assistance Equipment used: Rolling walker (2 wheeled) Transfers: Sit to/from Stand Sit to Stand: Mod assist General transfer comment: Worked on symetrical standing technique.  Need assist to come forward more than lifting assist Ambulation/Gait Ambulation/Gait assistance: Mod assist Ambulation Distance (Feet): 90 Feet Assistive device: Rolling walker (2 wheeled) Gait Pattern/deviations: Step-through pattern;Step-to pattern;Decreased stance time -  right Gait velocity: decreased General Gait Details: Hemiparetic in nature with relative flat contact on R LE though improved toward heel toe with cuing.  Noteable fatigue with sloppier gait pattern with as fatigue set in. Modified Rankin (Stroke Patients Only) Pre-Morbid Rankin Score: No symptoms Modified Rankin: Moderately severe disability    Exercises     PT Diagnosis:    PT Problem List:   PT Treatment Interventions:     PT Goals (current goals can now be found in the care plan section) Acute Rehab PT Goals Patient Stated Goal: recover and get back to being independent PT Goal Formulation: With patient/family Time For Goal Achievement: 09/25/13 Potential to Achieve Goals: Good  Visit Information  Last PT Received On: 09/16/13 Assistance Needed: +2 (safety) History of Present Illness: 58 yo male s/p prostatectomy 2/20 for malignant neoplasm and 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 Now transfered back to acute for ileus vs SBO.    Subjective Data  Subjective: It's a good time because I just got my medicine Patient Stated Goal: recover and get back to being independent   Cognition  Cognition Arousal/Alertness: Awake/alert Behavior During Therapy: WFL for tasks assessed/performed Overall Cognitive Status: Within Functional Limits for tasks assessed    Balance  Balance Overall balance assessment: Needs assistance Sitting-balance support: No upper extremity supported;Feet supported Sitting balance-Leahy Scale: Fair Standing balance support: Bilateral upper extremity supported Standing balance-Leahy Scale: Poor Standing balance comment: worked on w/shift and balance with progression through w/shift to stepping to marching in place  End of Session PT - End of Session Equipment Utilized During Treatment: Gait belt Activity Tolerance: Patient limited by fatigue;Patient tolerated treatment well Patient left: in  bed;with  call bell/phone within reach;with family/visitor present Nurse Communication: Mobility status   GP     Thompson Mckim, Tessie Fass 09/16/2013, 4:44 PM 09/16/2013  Donnella Sham, [place] [phone]  (pager)

## 2013-09-16 NOTE — Progress Notes (Signed)
PROGRESS NOTE    Tanner [name] GYJ:[id number] DOB: 1956/03/31 DOA: 09/10/2013 PCP: Criselda Peaches, MD Primary Urologist: Dr. Rolan Bucco  HPI/Brief narrative 58 year old male with history of prostate cancer, hypertension, alcoholic hepatitis, status post radical robotic prostatectomy with bilateral pelvic lymph node dissection and lysis of colonic additions on 08/29/13, postop acute left corner radiata infarct with associated right hemiparesis on 09/02/13, transferred to inpatient rehabilitation on 09/05/13, developed nausea, vomiting, diarrhea and abdominal distention. GI was consulted and diagnosed postop ileus-narcotics were stopped, NG tube and rectal tube were placed, started on IV Reglan, found to be hypokalemic and urine cultures grew Escherichia coli 55,000 colonies. Due to worsening condition, he was transferred to the hospital on 09/10/13 for further management. He continues to have ileus and diarrhea-being managed conservatively and gradually starting to improve.  Assessment/Plan:  1. Ileus versus SBO: Ileus multifactorial from immobility, C. difficile, hypokalemia. CT abdomen however suggested SBO with transition point. Continue n.p.o., NG tube, rectal tube, IV fluids, aggressive potassium replacement and follow clinically. GI & surgery following & recommend continued conservative treatment without surgical needs. Started TNA 3/6. GI losses starting to finally decrease in the last 24 hours. As per discussion with surgery, continue current management for additional 24 hours and if NG drainage is better, consider clamping in the morning. 2. Severe hypokalemia: Secondary to GI losses. Magnesium normal. Replaced aggressively. Hypernatremia-IV fluids changed to half normal saline. Follow BMP daily 3. C. difficile colitis: Continue IV Flagyl and by mouth vancomycin added with NG tube clamping. Not on PPIs. Minimize antibiotics. Continues to have significant rectal tube drainage-but  decreasing. 4. Escherichia coli UTI versus colonization: Patient is on day 7 of IV antibiotics-changed from Rocephin to cefazolin-DC. Urology input appreciated-Foley decision to be made by urology. 5. Hypertension: Fluctuating and uncontrolled. Continue IV metoprolol-increased to 5 mg IV every 6 hours on 3/6. 6. Anemia: Stable. 7. Recent stroke with right hemiparesis: Aspirin with NG tube clamping. Physical therapy. 8. Recent radical robotic prostatectomy: Urology has seen. 9. Incidental finding of right ICA aneurysm: Will need followup with repeat imaging in one year. 10. Anxiety and depression: Psychiatry input on 3/9 appreciated.   Code Status: Full Family Communication: Discussed with patient's sister at bedside Disposition Plan: DC to CIR when medically stable   Consultants:  Gastroenterology  General surgery  Urology  Procedures:  Foley catheter  NG tube  Rectal tube  Antibiotics:  IV cefazolin 3/3 > 3/4  IV Rocephin 3/4 > 3/5  IV cefazolin 3/5 > 3/10  Subjective: Heartburn.  Objective: Filed Vitals:   09/15/13 1352 09/15/13 2007 09/16/13 0437 09/16/13 0900  BP: 146/92 150/99 160/98 153/96  Pulse: 97 102 13 103  Temp: 98.9 F (37.2 C) 97.6 F (36.4 C) 98.3 F (36.8 C)   TempSrc: Oral Oral Oral   Resp: 18 18 18    Height:      Weight:      SpO2: 97% 95% 96%     Intake/Output Summary (Last 24 hours) at 09/16/13 1650 Last data filed at 09/16/13 0807  Gross per 24 hour  Intake   1000 ml  Output    750 ml  Net    250 ml   Filed Weights   09/12/13 0700 09/14/13 0510 09/15/13 0500  Weight: 80 kg (176 lb 5.9 oz) 76 kg (167 lb 8.8 oz) 76 kg (167 lb 8.8 oz)     Exam:  General exam: Lying comfortably in bed. Respiratory system: clear to auscultation. No increased work of  breathing. Cardiovascular system: S1 & S2 heard, RRR. No JVD, murmurs, gallops, clicks or pedal edema. Telemetry: Sinus rhythm in the 90s-sinus tachycardia in the  100s. Gastrointestinal system: Abdomen is non-distended, soft and nontender with few bowel sounds. Copious drainage via NG and rectal tube-decreasing. Central nervous system: Alert and oriented. No cranial deficits. Extremities: 5 x 5 power in left limbs and 4 x 5 power in right limbs Psych: Flat affect   Data Reviewed: Basic Metabolic Panel:  Recent Labs Lab 09/10/13 0625  09/10/13 2325  09/12/13 1720 09/13/13 0509 09/14/13 0516 09/15/13 0500 09/16/13 0431  NA 136*  < >  --   < > 144 144 151* 149* 144  K 2.4*  < >  --   < > 3.4* 3.4* 3.5* 4.2 3.8  CL 94*  < >  --   < > 103 106 110 109 103  CO2 27  < >  --   < > 26 27 30 30 31   GLUCOSE 139*  < >  --   < > 97 124* 132* 136* 135*  BUN 19  < >  --   < > 11 10 10 13 17   CREATININE 0.97  < > 0.94  < > 0.84 0.81 0.83 0.80 0.86  CALCIUM 8.4  < >  --   < > 8.2* 8.3* 8.3* 8.5 8.5  MG 2.1  --  2.0  --   --  2.0 2.0 2.1  --   PHOS  --   --   --   --   --  2.4 3.8 3.9  --   < > = values in this interval not displayed. Liver Function Tests:  Recent Labs Lab 09/11/13 0453 09/13/13 0509 09/15/13 0500  AST 30 24 16   ALT 20 27 14   ALKPHOS 47 49 40  BILITOT 0.3 0.2* <0.2*  PROT 5.9* 5.6* 5.7*  ALBUMIN 2.3* 2.3* 2.3*   No results found for this basename: LIPASE, AMYLASE,  in the last 168 hours No results found for this basename: AMMONIA,  in the last 168 hours CBC:  Recent Labs Lab 09/10/13 2325 09/11/13 0453 09/12/13 0530 09/13/13 0509 09/15/13 0500  WBC 7.8 8.1 9.1 9.2 9.3  NEUTROABS  --  5.8  --  6.9 7.2  HGB 9.8* 10.0* 10.1* 9.7* 10.0*  HCT 28.6* 29.6* 30.3* 29.4* 31.1*  MCV 95.3 96.4 96.8 98.0 101.0*  PLT 308 336 332 320 247   Cardiac Enzymes: No results found for this basename: CKTOTAL, CKMB, CKMBINDEX, TROPONINI,  in the last 168 hours BNP (last 3 results) No results found for this basename: PROBNP,  in the last 8760 hours CBG:  Recent Labs Lab 09/15/13 1112 09/15/13 1705 09/16/13 09/16/13 0633  09/16/13 1202  GLUCAP 144* 121* 128* 122* 135*    Recent Results (from the past 240 hour(s))  CULTURE, BLOOD (ROUTINE X 2)     Status: None   Collection Time    09/06/13  6:33 PM      Result Value Ref Range Status   Specimen Description BLOOD RIGHT ARM   Final   Special Requests BOTTLES DRAWN AEROBIC AND ANAEROBIC 5CC   Final   Culture  Setup Time     Final   Value: 09/07/2013 02:57     Performed at [facility]   Culture     Final   Value: NO GROWTH 5 DAYS     Performed at [facility]   Report Status 09/13/2013 FINAL  Final  CULTURE, BLOOD (ROUTINE X 2)     Status: None   Collection Time    09/06/13  6:45 PM      Result Value Ref Range Status   Specimen Description BLOOD RIGHT HAND   Final   Special Requests BOTTLES DRAWN AEROBIC AND ANAEROBIC 5CC   Final   Culture  Setup Time     Final   Value: 09/07/2013 02:57     Performed at [facility]   Culture     Final   Value: NO GROWTH 5 DAYS     Performed at [facility]   Report Status 09/13/2013 FINAL   Final  URINE CULTURE     Status: None   Collection Time    09/06/13  8:15 PM      Result Value Ref Range Status   Specimen Description URINE, RANDOM   Final   Special Requests BAG   Final   Culture  Setup Time     Final   Value: 09/07/2013 03:12     Performed at [facility] Count     Final   Value: 55,000 COLONIES/ML     Performed at [facility]   Culture     Final   Value: ESCHERICHIA COLI     Performed at [facility]   Report Status 09/08/2013 FINAL   Final   Organism ID, Bacteria ESCHERICHIA COLI   Final  CLOSTRIDIUM DIFFICILE BY PCR     Status: Abnormal   Collection Time    09/11/13 12:09 AM      Result Value Ref Range Status   C difficile by pcr POSITIVE (*) NEGATIVE Final   Comment: CRITICAL RESULT CALLED TO, READ BACK BY AND VERIFIED WITH:     Coralyn Mark RN 09/11/13 0740 COSTELLO B       Studies: Dg Abd 2 Views  09/16/2013   CLINICAL  DATA:  Abdominal pain, evaluate for small bowel obstruction versus ileus  EXAM: ABDOMEN - 2 VIEW  COMPARISON:  DG ABD PORTABLE 1V dated 09/14/2013; DG ABD PORTABLE 1V dated 09/13/2013; DG ABD 1 VIEW dated 09/12/2013; DG ABD PORTABLE 1V dated 09/13/2013; CT ABD/PELVIS W CM dated 09/11/2013  FINDINGS: There is mild gaseous distention of several rather featureless appearing loops of small bowel with index loop within the left upper abdominal quadrant measuring approximately 4.4 cm in diameter, previously, 5.6 cm. There is a minimal amount of distal rectal gas. Otherwise, there is a conspicuous paucity of distal colonic gas.  No pneumoperitoneum.  No pneumatosis or portal venous gas.  No definite abnormal intra-abdominal calcifications. A presumed rectal tube overlies the lower pelvis. The enteric tube tip and side port overlie the gastric fundus  No acute osseus abnormalities.  IMPRESSION: Overall findings most suggestive of minimally improved though persistent small bowel obstruction. Continued attention on follow-up is recommended.   Electronically Signed   By: Sandi Mariscal M.D.   On: 09/16/2013 09:34        Scheduled Meds: . aspirin  325 mg Oral Daily  .  ceFAZolin (ANCEF) IV  1 g Intravenous 3 times per day  . clonazePAM  0.5 mg Oral BID  . enoxaparin (LOVENOX) injection  40 mg Subcutaneous QHS  . famotidine (PEPCID) IV  20 mg Intravenous Q12H  . FLUoxetine  20 mg Oral Daily  . metoprolol  5 mg Intravenous 4 times per day  . metronidazole  500 mg Intravenous Q8H  . vancomycin  500 mg Per Tube 4 times per day   Continuous Infusions: . Marland KitchenTPN (CLINIMIX-E) Adult 80 mL/hr at 09/15/13 1721   And  . fat emulsion 250 mL (09/15/13 1721)  . Marland KitchenTPN (CLINIMIX-E) Adult      Principal Problem:   Ileus Active Problems:   Malignant neoplasm of prostate   CVA (cerebral infarction)   Hypokalemia   C. difficile colitis   UTI (urinary tract infection)   Essential hypertension, benign    Time spent: 78  minutes    Dalyla Chui, MD, FACP, FHM. [facility] Pager [phone]  If 7PM-7AM, please contact night-coverage [email or web address] Password [email or web address] 09/16/2013, 4:50 PM    LOS: 6 days

## 2013-09-17 ENCOUNTER — Inpatient Hospital Stay (HOSPITAL_COMMUNITY): Payer: Medicaid Other

## 2013-09-17 LAB — BASIC METABOLIC PANEL
BUN: 19 mg/dL (ref 6–23)
CHLORIDE: 99 meq/L (ref 96–112)
CO2: 28 mEq/L (ref 19–32)
Calcium: 8.5 mg/dL (ref 8.4–10.5)
Creatinine, Ser: 0.82 mg/dL (ref 0.50–1.35)
GFR calc non Af Amer: >90 mL/min (ref >90)
Glucose, Bld: 134 mg/dL — ABNORMAL HIGH (ref 70–99)
Potassium: 3.8 mEq/L (ref 3.7–5.3)
SODIUM: 138 meq/L (ref 137–147)

## 2013-09-17 LAB — GLUCOSE, CAPILLARY
GLUCOSE-CAPILLARY: 121 mg/dL — AB (ref 70–99)
GLUCOSE-CAPILLARY: 137 mg/dL — AB (ref 70–99)
GLUCOSE-CAPILLARY: 145 mg/dL — AB (ref 70–99)
Glucose-Capillary: 127 mg/dL — ABNORMAL HIGH (ref 70–99)
Glucose-Capillary: 128 mg/dL — ABNORMAL HIGH (ref 70–99)

## 2013-09-17 MED ORDER — FAT EMULSION 20 % IV EMUL
250.0000 mL | INTRAVENOUS | Status: AC
  Administered 2013-09-17: 250 mL via INTRAVENOUS
  Filled 2013-09-17: qty 250

## 2013-09-17 MED ORDER — LORAZEPAM 2 MG/ML IJ SOLN
1.0000 mg | Freq: Three times a day (TID) | INTRAMUSCULAR | Status: DC | PRN
  Administered 2013-09-17 – 2013-09-21 (×11): 1 mg via INTRAVENOUS
  Filled 2013-09-17 (×12): qty 1

## 2013-09-17 MED ORDER — TRACE MINERALS CR-CU-F-FE-I-MN-MO-SE-ZN IV SOLN
INTRAVENOUS | Status: AC
  Administered 2013-09-17: 18:00:00 via INTRAVENOUS
  Filled 2013-09-17: qty 2000

## 2013-09-17 MED ORDER — SODIUM CHLORIDE 0.45 % IV SOLN
INTRAVENOUS | Status: DC
  Administered 2013-09-18 – 2013-09-24 (×5): via INTRAVENOUS

## 2013-09-17 NOTE — Progress Notes (Signed)
Pt called out after sneezing; pt states NG tube moved and is no longer suctioning; listened to verify placement and no sound was heard; second RN listened to verify placement and no sound was heard; notified MD; MD is ordering Stat portable xray; MD orders suction to be off until xray results.  Carollee Sires, RN

## 2013-09-17 NOTE — Progress Notes (Signed)
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 Now transfered back to acute for ileus vs SBO.   PT Comments   Slowly improving.  Low endurance remains, but noticeable steadier for longer distances. Should do well in rehab once can come off the NG  Follow Up Recommendations  CIR     Does the patient have the potential to tolerate intense rehabilitation     Barriers to Discharge        Equipment Recommendations  Other (comment) (TBA)    Recommendations for Other Services Rehab consult  Frequency Min 4X/week   Progress towards PT Goals Progress towards PT goals: Progressing toward goals  Plan Current plan remains appropriate    Precautions / Restrictions Precautions Precautions: Fall Precaution Comments: multiple lines/tubes. Restrictions Weight Bearing Restrictions: No   Pertinent Vitals/Pain     Mobility  Bed Mobility Overal bed mobility: Needs Assistance Bed Mobility: Supine to Sit;Sit to Sidelying Supine to sit: Mod assist Sit to sidelying: Mod assist (mostly at LE's) General bed mobility comments: min bridging, light mod truncal assist for both up and. Transfers Overall transfer level: Needs assistance Equipment used: Rolling walker (2 wheeled) Transfers: Sit to/from Stand Sit to Stand: Min assist General transfer comment: Worked on symetrical standing technique.  Need assist to come forward more than lifting assist Ambulation/Gait Ambulation/Gait assistance: +2 physical assistance;Mod assist;+2 safety/equipment (more assist as he fatigued, more safety, lines otherwiise) Ambulation Distance (Feet): 155 Feet Assistive  device: Rolling walker (2 wheeled) Gait Pattern/deviations: Step-through pattern Gait velocity: decreased Gait velocity interpretation: Below normal speed for age/gender General Gait Details: Still hemiparetic, but working on heel/toe to smooth out gait. Modified Rankin (Stroke Patients Only) Pre-Morbid Rankin Score: No symptoms Modified Rankin: Moderately severe disability    Exercises Other Exercises Other Exercises: level 3 theraband x 10 B shoulder and elbow   PT Diagnosis:    PT Problem List:   PT Treatment Interventions:     PT Goals (current goals can now be found in the care plan section) Acute Rehab PT Goals Patient Stated Goal: recover and get back to being independent PT Goal Formulation: With patient/family Time For Goal Achievement: 09/25/13 Potential to Achieve Goals: Good  Visit Information  Last PT Received On: 09/17/13 Assistance Needed: +2 (lines/o2) History of Present Illness: 58 yo male s/p prostatectomy 2/20 for malignant neoplasm and 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 Now transfered back to acute for ileus vs SBO.    Subjective Data  Subjective: They found more output in the canister today versus yesterday, so I don't think I'll get to get this out any time soon. Patient Stated Goal: recover and get back to being independent   Cognition  Cognition Arousal/Alertness: Awake/alert Behavior During Therapy: WFL for tasks assessed/performed Overall Cognitive Status: Within Functional Limits for tasks assessed    Balance  Balance Overall balance assessment: Needs assistance Sitting-balance support: Feet supported Sitting balance-Leahy Scale: Fair Standing balance-Leahy Scale: Fair  End of Session PT - End of Session Activity Tolerance: Patient limited by fatigue;Patient tolerated treatment well Patient left: in bed;with call bell/phone within reach Nurse Communication: Mobility status   GP  Fox Salminen, Tessie Fass 09/17/2013, 4:51 PM 09/17/2013  Donnella Sham, PT [phone]  (pager)

## 2013-09-17 NOTE — Progress Notes (Signed)
Daily Rounding Note  09/17/2013, 10:07 AM  LOS: 7 days   SUBJECTIVE:       Increase in NGT suction to 2.8 liters.  Stool output: Not able to locate exact amount of stool output which is watery and not bloody. No abdominal pain.  Stomach feels empty.  C/o discomfort from rectal tube where it presses into his buttock cheek.  Sleeping better but mood is still depressed.   OBJECTIVE:         Vital signs in last 24 hours:    Temp:  [97.9 F (36.6 C)-98.1 F (36.7 C)] 97.9 F (36.6 C) (03/11 0436) Pulse Rate:  [101-112] 101 (03/11 0436) Resp:  [18] 18 (03/11 0436) BP: (132-157)/(89-97) 157/97 mmHg (03/11 0436) SpO2:  [95 %] 95 % (03/11 0436) Last BM Date: 09/14/13 General: comfortable but depressed, somewhat disengaged   Heart: RRR Chest: clear bil Abdomen: minor tenderness, soft, not distended.  About 300 CC brownish water in flexiseal Extremities: no CCE Neuro/Psych:  Flat affect, a bit more alert and engaged.  Still depressed.   Intake/Output from previous day: 2022/10/05 0701 - 03/11 0700 In: 90 [NG/GT:90] Out: 3350 [Urine:550; Emesis/NG output:2800]  Intake/Output this shift:    Lab Results:  Recent Labs  09/15/13 0500  WBC 9.3  HGB 10.0*  HCT 31.1*  PLT 247   BMET  Recent Labs  09/15/13 0500 10/04/2013 0431 09/17/13 0500  NA 149* 144 138  K 4.2 3.8 3.8  CL 109 103 99  CO2 30 31 28   GLUCOSE 136* 135* 134*  BUN 13 17 19   CREATININE 0.80 0.86 0.82  CALCIUM 8.5 8.5 8.5   LFT  Recent Labs  09/15/13 0500  PROT 5.7*  ALBUMIN 2.3*  AST 16  ALT 14  ALKPHOS 40  BILITOT <0.2*   PT/INR No results found for this basename: LABPROT, INR,  in the last 72 hours Hepatitis Panel No results found for this basename: HEPBSAG, HCVAB, HEPAIGM, HEPBIGM,  in the last 72 hours  Studies/Results: Dg Abd 2 Views  2013-10-04   CLINICAL DATA:  Abdominal pain, evaluate for small bowel obstruction versus ileus   EXAM: ABDOMEN - 2 VIEW  COMPARISON:  DG ABD PORTABLE 1V dated 09/14/2013; DG ABD PORTABLE 1V dated 09/13/2013; DG ABD 1 VIEW dated 09/12/2013; DG ABD PORTABLE 1V dated 09/13/2013; CT ABD/PELVIS W CM dated 09/11/2013  FINDINGS: There is mild gaseous distention of several rather featureless appearing loops of small bowel with index loop within the left upper abdominal quadrant measuring approximately 4.4 cm in diameter, previously, 5.6 cm. There is a minimal amount of distal rectal gas. Otherwise, there is a conspicuous paucity of distal colonic gas.  No pneumoperitoneum.  No pneumatosis or portal venous gas.  No definite abnormal intra-abdominal calcifications. A presumed rectal tube overlies the lower pelvis. The enteric tube tip and side port overlie the gastric fundus  No acute osseus abnormalities.  IMPRESSION: Overall findings most suggestive of minimally improved though persistent small bowel obstruction. Continued attention on follow-up is recommended.   Electronically Signed   By: Sandi Mariscal M.D.   On: 10-04-2013 09:34    ASSESMENT:   * PSBO vs ileus. NGT output increased. Surgery following and considering SBFT if xray today is worse tomorrow.  Remains on NGT, TNA, rectal tube. LFTs normal  * C diff. Day 7 IV Flagyl/per tube Vanc.  * Hypokalemia, corrected finally.  * E coli UTI. Course of Ancef completed.   *  Prostatectomy 08/29/13  * Post op CVA, right hemiparesis improving.  * normocytic anemia.  * Hx etoh abuse/alcoholism?: 8 to 10 beers daily PTA. Normal liver on CT 3/5  * Depression. Psychiatry started Clonazepam, prn ativan, and fluoxetine. No signif change except sleeping better.    PLAN   *  Would finish 7 more days of Flagyl/Vanco.(one additional week after cessation of Ancef)   *  Mgt of ileus/PSBO per surgery. *  Will sign off.    Azucena Freed  09/17/2013, 10:07 AM  Pager: [phone] Attending MD note:   I have taken a history, examined the patient, and reviewed the chart. I agree  with the Advanced Practitioner's impression and recommendations. Will bowel obstruction, there is quiet the abdomen today,. Patient is on C. difficile treatment. Surgery is managing small bowel obstruction. Will see as needed  [facility] Pager # [phone]

## 2013-09-17 NOTE — Progress Notes (Signed)
  Subjective: Complains of rectal pain from the tube Only mild abdominal pain He reports he took in minimal ice/water yesterday  Objective: Vital signs in last 24 hours: Temp:  [97.9 F (36.6 C)-98.1 F (36.7 C)] 97.9 F (36.6 C) (03/11 0436) Pulse Rate:  [101-112] 101 (03/11 0436) Resp:  [18] 18 (03/11 0436) BP: (132-157)/(89-97) 157/97 mmHg (03/11 0436) SpO2:  [95 %] 95 % (03/11 0436) Last BM Date: 09/14/13  Intake/Output from previous day: Oct 10, 2022 0701 - 03/11 0700 In: 90 [NG/GT:90] Out: 3350 [Urine:550; Emesis/NG output:2800] Intake/Output this shift:    Abdomen soft, minimal tenderness  Lab Results:   Recent Labs  09/15/13 0500  WBC 9.3  HGB 10.0*  HCT 31.1*  PLT 247   BMET  Recent Labs  2013-10-09 0431 09/17/13 0500  NA 144 138  K 3.8 3.8  CL 103 99  CO2 31 28  GLUCOSE 135* 134*  BUN 17 19  CREATININE 0.86 0.82  CALCIUM 8.5 8.5   PT/INR No results found for this basename: LABPROT, INR,  in the last 72 hours ABG No results found for this basename: PHART, PCO2, PO2, HCO3,  in the last 72 hours  Studies/Results: Dg Abd 2 Views  Oct 09, 2013   CLINICAL DATA:  Abdominal pain, evaluate for small bowel obstruction versus ileus  EXAM: ABDOMEN - 2 VIEW  COMPARISON:  DG ABD PORTABLE 1V dated 09/14/2013; DG ABD PORTABLE 1V dated 09/13/2013; DG ABD 1 VIEW dated 09/12/2013; DG ABD PORTABLE 1V dated 09/13/2013; CT ABD/PELVIS W CM dated 09/11/2013  FINDINGS: There is mild gaseous distention of several rather featureless appearing loops of small bowel with index loop within the left upper abdominal quadrant measuring approximately 4.4 cm in diameter, previously, 5.6 cm. There is a minimal amount of distal rectal gas. Otherwise, there is a conspicuous paucity of distal colonic gas.  No pneumoperitoneum.  No pneumatosis or portal venous gas.  No definite abnormal intra-abdominal calcifications. A presumed rectal tube overlies the lower pelvis. The enteric tube tip and side port  overlie the gastric fundus  No acute osseus abnormalities.  IMPRESSION: Overall findings most suggestive of minimally improved though persistent small bowel obstruction. Continued attention on follow-up is recommended.   Electronically Signed   By: Sandi Mariscal M.D.   On: 10-09-2013 09:34    Anti-infectives: Anti-infectives   Start     Dose/Rate Route Frequency Ordered Stop   09/11/13 2200  ceFAZolin (ANCEF) IVPB 1 g/50 mL premix  Status:  Discontinued     1 g 100 mL/hr over 30 Minutes Intravenous 3 times per day 09/11/13 1758 2013/10/09 1658   09/11/13 1200  vancomycin (VANCOCIN) 50 mg/mL oral solution 500 mg     500 mg Per Tube 4 times per day 09/11/13 1159     09/11/13 1000  metroNIDAZOLE (FLAGYL) IVPB 500 mg     500 mg 100 mL/hr over 60 Minutes Intravenous Every 8 hours 09/11/13 0932     09/10/13 2230  cefTRIAXone (ROCEPHIN) 1 g in dextrose 5 % 50 mL IVPB  Status:  Discontinued     1 g 100 mL/hr over 30 Minutes Intravenous Every 24 hours 09/10/13 2145 09/11/13 1758      Assessment/Plan: s/p * No surgery found *  Post op ileus and C.diff colitis  NG output unfortunately up over last 24 hours.  Will continue to suction and repeat xrays tomorrow. If films are worse, will check a SBFT  LOS: 7 days    Tanner [name] A 09/17/2013

## 2013-09-17 NOTE — Progress Notes (Signed)
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 Now transfered back to acute for ileus vs SBO.   OT comments  Pt very willing to participate in self care and UE exercise.  Pt now with 4/5 strength in R shoulder, elbow, forearm and gross grasp.  Incoordination much improved.  Instructed pt in B UE exercise using level 3 theraband.  Tolerated well.  Left theraband in room for pt to continue exercises outside of therapy and encouraged pt to use R UE in ADL as lead. Will need further intense rehab due to prolonged hospitalization with multiple complications.  Follow Up Recommendations  CIR    Barriers to Discharge       Equipment Recommendations       Recommendations for Other Services    Frequency Min 3X/week   Progress towards OT Goals Progress towards OT goals: Progressing toward goals  Plan Discharge plan remains appropriate    Precautions / Restrictions Precautions Precautions: Fall Precaution Comments: multiple lines/tubes. Restrictions Weight Bearing Restrictions: No   Pertinent Vitals/Pain Did not c/o pain, VSS.    ADL  Eating/Feeding: Set up (ice chips with R hand) Where Assessed - Eating/Feeding: Bed level Grooming: Wash/dry face;Brushing hair;Minimal assistance Where Assessed - Grooming: Supine, head of bed up Equipment Used: Other (comment) (level 3 t band) ADL Comments: Pt encouraged to use R UE as lead during grooming, self feeding activities now that strength and tone has improved.    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 Now transfered back to acute for ileus vs SBO.    Subjective Data      Prior Functioning       Cognition  Cognition Arousal/Alertness: Awake/alert Behavior During Therapy: WFL for tasks assessed/performed Overall Cognitive Status: Within Functional Limits for tasks assessed    Mobility       Exercises  Other Exercises Other Exercises: level 3 theraband x 10 B shoulder and elbow   Balance    End of Session OT - End of Session Activity Tolerance: Patient tolerated treatment well Patient left: in bed;with call bell/phone within reach;with family/visitor present  GO     Malka So 09/17/2013, 1:46 PM [phone]

## 2013-09-17 NOTE — Progress Notes (Signed)
PARENTERAL NUTRITION CONSULT NOTE - FOLLOW UP  Pharmacy Consult:  TPN Indication:  Prolonged ileus vs SBO  Allergies  Allergen Reactions  . Dilaudid [Hydromorphone Hcl] Nausea And Vomiting    Pre pt history  . Oxycodone Nausea And Vomiting  . Procaine Hcl Other (See Comments)    Patient states he is allergic to novicaine  . Zofran [Ondansetron] Nausea And Vomiting  . Sulfa Antibiotics Rash    Patient Measurements: Height: 5\' 10"  (177.8 cm) Weight: 167 lb 8.8 oz (76 kg) IBW/kg (Calculated) : 73  Vital Signs: Temp: 97.9 F (36.6 C) (03/11 0436) Temp src: Oral (03/11 0436) BP: 157/97 mmHg (03/11 0436) Pulse Rate: 101 (03/11 0436) Intake/Output from previous day: 03/10 0701 - 03/11 0700 In: 90 [NG/GT:90] Out: 3350 [Urine:550; Emesis/NG output:2800]  Labs:  Recent Labs  09/15/13 0500  WBC 9.3  HGB 10.0*  HCT 31.1*  PLT 247     Recent Labs  09/15/13 0500 09/16/13 0431 09/17/13 0500  NA 149* 144 138  K 4.2 3.8 3.8  CL 109 103 99  CO2 30 31 28   GLUCOSE 136* 135* 134*  BUN 13 17 19   CREATININE 0.80 0.86 0.82  CALCIUM 8.5 8.5 8.5  MG 2.1  --   --   PHOS 3.9  --   --   PROT 5.7*  --   --   ALBUMIN 2.3*  --   --   AST 16  --   --   ALT 14  --   --   ALKPHOS 40  --   --   BILITOT <0.2*  --   --   PREALBUMIN 12.2*  --   --   TRIG 183*  --   --    Estimated Creatinine Clearance: 105.1 ml/min (by C-G formula based on Cr of 0.82).    Recent Labs  09/16/13 1202 09/16/13 1746 09/17/13 0006  GLUCAP 135* 126* 121*     Insulin Requirements in the past 24 hours:  SSI d/c'd 3/10 CBGs 121-135 to continue  Assessment: 43 YOM admitted on 08/29/13 for work-up of prostate cancer and underwent radical robotic prostatectomy.  He was diagnosed with a new CVA post-op as well as right ICA aneurysm.  Discharged to Rehab on 09/06/13 and was later found to have an ileus requiring NGT for decompression.  Returned to the acute care services on 09/10/13 for further  management.  Pharmacy consulted to manage TPN due to prolonged ileus vs SBO.  GI: hx GERD. +C.diff colitis. Baseline prealbumin low at 11.6 now 12.2. NG output 3000>>1200>>2800. Stool output 1150>>none recorded but watery from rectal tube. Patient is not currently on a probiotic due to inability to take po's. KUB: Slight interval worsening of small bowel dilatation since 3/7. Overall pattern shows improvement since 03/05. Still having upper abdominal pain. Still has some nausea with NG clamping. Recheck xray Thurs; if worse, plan SBFT.  Endo: no hx DM, A1c 5% - CBGs controlled <150 on SSI.  Lytes: Hypokalemia 3.8 today with goal K >/= 4 for ileus), hypernatremia resolved with Na 138.  Renal: SCr 0.8 (stable), CrCL 100, UOP 0.3. Plain film cystogram once contrast clears to ensure no urine leak.    Cards: HTN - BP elevated at 157/97, HR 101-112 - IV Lopressor, ASA 325/tube, IV Hydralazine prn.  Hepatobil: EtOH hepatitis - LFTs / tbili WNL. Triglycerides have increased slightly from 140>>183 today. Will watch.  Neuro: anxiety / recent CVA - on ASA 325 po. 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  Depressed  ID: Treated E.coli UTI + Flagyl/PO Vanc for +C.diff, afebrile, WBC 9.3.  Best Practices: Lovenox  TPN Access:  PICC placed 09/09/13  TPN day#: 5 (3/6 >> )  Current Nutrition:  TPN at 4ml/hr = 1894 kcal and 99.6g protein  (90% kcal, 90.5% protein goals)  Nutritional Goals:  2100-2300 kCal, 110-120 grams of protein per day   Plan:  - Clinimix E 5/15 to 83 ml/hr and continue lipids to 10 ml/hr.   - Daily IV multivitamin, folic acid, and thiamine in TPN - Trace elements on MWF only d/t national shortage - Continue CBGs but d/c SSI and monitor.    Shalynn Jorstad S. Alford Highland, PharmD, [place] Clinical Staff Pharmacist Pager [phone] 09/17/2013, 10:45 AM

## 2013-09-17 NOTE — Progress Notes (Signed)
PROGRESS NOTE    Tanner [name] VZC:[id number] DOB: 11-20-1955 DOA: 09/10/2013 PCP: Tanner Peaches, MD Primary Urologist: Dr. Rolan [name]  HPI/Brief narrative 58 year old male with history of prostate cancer, hypertension, alcoholic hepatitis, status post radical robotic prostatectomy with bilateral pelvic lymph node dissection and lysis of colonic additions on 08/29/13, postop acute left corner radiata infarct with associated right hemiparesis on 09/02/13, transferred to inpatient rehabilitation on 09/05/13, developed nausea, vomiting, diarrhea and abdominal distention. GI was consulted and diagnosed postop ileus-narcotics were stopped, NG tube and rectal tube were placed, started on IV Reglan, found to be hypokalemic and urine cultures grew Escherichia coli 55,000 colonies. Due to worsening condition, he was transferred to the hospital on 09/10/13 for further management. He continues to have ileus and diarrhea-being managed conservatively and gradually starting to improve.  Assessment/Plan:  1. SBO:  multifactorial from adhesions, immobility, C. difficile, hypokalemia.        - CT abdomen suggested SBO with transition point. Continue n.p.o., NG tube, rectal tube, IV fluids, aggressive potassium replacement and follow clinically.         -GI & surgery following & recommend continued conservative treatment without surgical needs.        - Started TNA 3/6. NG output increasing        -KUB in am        -may need SBFT tomorrow        -may need Surgical mgt if conservative mgt fails  2. Severe hypokalemia: Secondary to GI losses. Magnesium normal. Replaced aggressively.   3. C. difficile colitis: Continue IV Flagyl and DC PO vancomycin while NG with intermittent suction . Continues to have significant rectal tube drainage-but decreasing.       -resume PO Vanc once NG out  4. Escherichia coli UTI versus colonization: Patient is on day 7 of IV antibiotics-changed from Rocephin to cefazolin-DC.  Urology input appreciated     -Foley decision to be made by urology, s/p Radical prostatectomy  5. Hypertension: Fluctuating and uncontrolled. Continue IV metoprolol-increased to 5 mg IV every 6 hours  6. Anemia: Stable, CBC in am  7. Recent stroke with right hemiparesis:     -unable to give ASA due to NG decompression and diarrhea with rectal tube    -resume ASA when able to    -Physical therapy  8. Recent radical robotic prostatectomy: Urology following  9. Incidental finding of right ICA aneurysm: Will need followup with repeat imaging in one year.  10. Anxiety and depression: Psychiatry input on 3/9 appreciated.      -ATivan PRN  Code Status: Full Family Communication: Discussed with patient's sister at bedside Disposition Plan: DC to CIR when medically stable   Consultants:  Gastroenterology  General surgery  Urology  Procedures:  Foley catheter  NG tube  Rectal tube  Antibiotics:  IV cefazolin 3/3 > 3/4  IV Rocephin 3/4 > 3/5  IV cefazolin 3/5 > 3/10  Subjective: Complains of nausea, mild abd discomfort  Objective: Filed Vitals:   09/16/13 2019 09/17/13 0052 09/17/13 0436 09/17/13 1423  BP: 137/89 132/91 157/97 142/85  Pulse: 105 112 101 98  Temp: 98.1 F (36.7 C)  97.9 F (36.6 C) 98.1 F (36.7 C)  TempSrc: Oral  Oral Oral  Resp: 18  18 18   Height:      Weight:      SpO2: 95%  95% 96%    Intake/Output Summary (Last 24 hours) at 09/17/13 1431 Last data filed at 09/17/13 1306  Gross per 24 hour  Intake   2030 ml  Output   2600 ml  Net   -570 ml   Filed Weights   09/12/13 0700 09/14/13 0510 09/15/13 0500  Weight: 80 kg (176 lb 5.9 oz) 76 kg (167 lb 8.8 oz) 76 kg (167 lb 8.8 oz)     Exam:  General exam: Lying comfortably in bed. HEENT: NGT in situ Respiratory system: clear to auscultation. No increased work of breathing. Cardiovascular system: S1 & S2 heard, RRR. No JVD, murmurs, gallops, clicks or pedal edema.  Gastrointestinal  system: Abdomen is non-distended, soft and nontender with few bowel sounds. Copious dark brown output via NG and rectal tube-decreasing. Central nervous system: Alert and oriented. No cranial deficits. Extremities: 5 x 5 power in left limbs and 4 x 5 power in right limbs Psych: Flat affect   Data Reviewed: Basic Metabolic Panel:  Recent Labs Lab 09/10/13 2325  09/12/13 1720 09/13/13 0509 09/14/13 0516 09/15/13 0500 09/16/13 0431 09/17/13 0500  NA  --   < > 144 144 151* 149* 144 138  K  --   < > 3.4* 3.4* 3.5* 4.2 3.8 3.8  CL  --   < > 103 106 110 109 103 99  CO2  --   < > 26 27 30 30 31 28   GLUCOSE  --   < > 97 124* 132* 136* 135* 134*  BUN  --   < > 11 10 10 13 17 19   CREATININE 0.94  < > 0.84 0.81 0.83 0.80 0.86 0.82  CALCIUM  --   < > 8.2* 8.3* 8.3* 8.5 8.5 8.5  MG 2.0  --   --  2.0 2.0 2.1  --   --   PHOS  --   --   --  2.4 3.8 3.9  --   --   < > = values in this interval not displayed. Liver Function Tests:  Recent Labs Lab 09/11/13 0453 09/13/13 0509 09/15/13 0500  AST 30 24 16   ALT 20 27 14   ALKPHOS 47 49 40  BILITOT 0.3 0.2* <0.2*  PROT 5.9* 5.6* 5.7*  ALBUMIN 2.3* 2.3* 2.3*   No results found for this basename: LIPASE, AMYLASE,  in the last 168 hours No results found for this basename: AMMONIA,  in the last 168 hours CBC:  Recent Labs Lab 09/10/13 2325 09/11/13 0453 09/12/13 0530 09/13/13 0509 09/15/13 0500  WBC 7.8 8.1 9.1 9.2 9.3  NEUTROABS  --  5.8  --  6.9 7.2  HGB 9.8* 10.0* 10.1* 9.7* 10.0*  HCT 28.6* 29.6* 30.3* 29.4* 31.1*  MCV 95.3 96.4 96.8 98.0 101.0*  PLT 308 336 332 320 247   Cardiac Enzymes: No results found for this basename: CKTOTAL, CKMB, CKMBINDEX, TROPONINI,  in the last 168 hours BNP (last 3 results) No results found for this basename: PROBNP,  in the last 8760 hours CBG:  Recent Labs Lab 09/16/13 1202 09/16/13 1746 09/17/13 0006 09/17/13 0645 09/17/13 1102  GLUCAP 135* 126* 121* 145* 137*    Recent Results  (from the past 240 hour(s))  CLOSTRIDIUM DIFFICILE BY PCR     Status: Abnormal   Collection Time    09/11/13 12:09 AM      Result Value Ref Range Status   C difficile by pcr POSITIVE (*) NEGATIVE Final   Comment: CRITICAL RESULT CALLED TO, READ BACK BY AND VERIFIED WITH:     SHELTON H RN 09/11/13 0740 COSTELLO B  Studies: Dg Abd 2 Views  09/16/2013   CLINICAL DATA:  Abdominal pain, evaluate for small bowel obstruction versus ileus  EXAM: ABDOMEN - 2 VIEW  COMPARISON:  DG ABD PORTABLE 1V dated 09/14/2013; DG ABD PORTABLE 1V dated 09/13/2013; DG ABD 1 VIEW dated 09/12/2013; DG ABD PORTABLE 1V dated 09/13/2013; CT ABD/PELVIS W CM dated 09/11/2013  FINDINGS: There is mild gaseous distention of several rather featureless appearing loops of small bowel with index loop within the left upper abdominal quadrant measuring approximately 4.4 cm in diameter, previously, 5.6 cm. There is a minimal amount of distal rectal gas. Otherwise, there is a conspicuous paucity of distal colonic gas.  No pneumoperitoneum.  No pneumatosis or portal venous gas.  No definite abnormal intra-abdominal calcifications. A presumed rectal tube overlies the lower pelvis. The enteric tube tip and side port overlie the gastric fundus  No acute osseus abnormalities.  IMPRESSION: Overall findings most suggestive of minimally improved though persistent small bowel obstruction. Continued attention on follow-up is recommended.   Electronically Signed   By: Sandi Mariscal M.D.   On: 09/16/2013 09:34        Scheduled Meds: . enoxaparin (LOVENOX) injection  40 mg Subcutaneous QHS  . famotidine (PEPCID) IV  20 mg Intravenous Q12H  . FLUoxetine  20 mg Oral Daily  . metoprolol  5 mg Intravenous 4 times per day  . metronidazole  500 mg Intravenous Q8H   Continuous Infusions: . sodium chloride 50 mL/hr at 09/17/13 1306  . fat emulsion Stopped (09/16/13 1830)  . Marland KitchenTPN (CLINIMIX-E) Adult     And  . fat emulsion    . Marland KitchenTPN (CLINIMIX-E) Adult 83  mL/hr at 09/17/13 1306    Principal Problem:   Ileus Active Problems:   Malignant neoplasm of prostate   CVA (cerebral infarction)   Hypokalemia   C. difficile colitis   UTI (urinary tract infection)   Essential hypertension, benign    Time spent: 30 minutes    Domenic Polite, MD, [facility] Pager [phone]  If 7PM-7AM, please contact night-coverage [email or web address] Password [email or web address] 09/17/2013, 2:31 PM    LOS: 7 days

## 2013-09-18 ENCOUNTER — Inpatient Hospital Stay (HOSPITAL_COMMUNITY): Payer: Medicaid Other

## 2013-09-18 LAB — COMPREHENSIVE METABOLIC PANEL
ALT: 25 U/L (ref 0–53)
AST: 31 U/L (ref 0–37)
Albumin: 2.3 g/dL — ABNORMAL LOW (ref 3.5–5.2)
Alkaline Phosphatase: 54 U/L (ref 39–117)
BUN: 19 mg/dL (ref 6–23)
CO2: 26 mEq/L (ref 19–32)
Calcium: 8.2 mg/dL — ABNORMAL LOW (ref 8.4–10.5)
Chloride: 99 mEq/L (ref 96–112)
Creatinine, Ser: 0.86 mg/dL (ref 0.50–1.35)
GFR calc Af Amer: >90 mL/min (ref >90)
GFR calc non Af Amer: >90 mL/min (ref >90)
Glucose, Bld: 120 mg/dL — ABNORMAL HIGH (ref 70–99)
Potassium: 4 mEq/L (ref 3.7–5.3)
Sodium: 136 mEq/L — ABNORMAL LOW (ref 137–147)
Total Bilirubin: <0.2 mg/dL — ABNORMAL LOW (ref 0.3–1.2)
Total Protein: 5.8 g/dL — ABNORMAL LOW (ref 6.0–8.3)

## 2013-09-18 LAB — GLUCOSE, CAPILLARY
GLUCOSE-CAPILLARY: 103 mg/dL — AB (ref 70–99)
Glucose-Capillary: 126 mg/dL — ABNORMAL HIGH (ref 70–99)
Glucose-Capillary: 103 mg/dL — ABNORMAL HIGH (ref 70–99)

## 2013-09-18 LAB — CBC
HCT: 27.3 % — ABNORMAL LOW (ref 39.0–52.0)
Hemoglobin: 9.2 g/dL — ABNORMAL LOW (ref 13.0–17.0)
MCH: 33 pg (ref 26.0–34.0)
MCHC: 33.7 g/dL (ref 30.0–36.0)
MCV: 97.8 fL (ref 78.0–100.0)
Platelets: 199 10*3/uL (ref 150–400)
RBC: 2.79 MIL/uL — ABNORMAL LOW (ref 4.22–5.81)
RDW: 13.1 % (ref 11.5–15.5)
WBC: 9.4 10*3/uL (ref 4.0–10.5)

## 2013-09-18 LAB — MAGNESIUM: Magnesium: 1.8 mg/dL (ref 1.5–2.5)

## 2013-09-18 LAB — PHOSPHORUS: Phosphorus: 4.1 mg/dL (ref 2.3–4.6)

## 2013-09-18 MED ORDER — THIAMINE HCL 100 MG/ML IJ SOLN
INTRAVENOUS | Status: AC
  Administered 2013-09-18: 18:00:00 via INTRAVENOUS
  Filled 2013-09-18: qty 2000

## 2013-09-18 MED ORDER — FAT EMULSION 20 % IV EMUL
250.0000 mL | INTRAVENOUS | Status: AC
  Administered 2013-09-18: 250 mL via INTRAVENOUS
  Filled 2013-09-18: qty 250

## 2013-09-18 NOTE — Progress Notes (Signed)
I continue to follow pt's progress to assist with d/c disposition once medically ready. [phone]

## 2013-09-18 NOTE — Progress Notes (Signed)
Subjective: Pt and family report that the NG was out approx 4 hours last night before being replaced.  He had no nausea.  There has been minimal output since it was replaced  Objective: Vital signs in last 24 hours: Temp:  [98.1 F (36.7 C)-98.6 F (37 C)] 98.2 F (36.8 C) 10-09-2022 0426) Pulse Rate:  [98-104] 104 2022-10-09 0426) Resp:  [16-18] 16 09-Oct-2022 0426) BP: (129-142)/(85-94) 129/94 mmHg Oct 09, 2022 0426) SpO2:  [94 %-99 %] 94 % 2022/10/09 0426) Weight:  [154 lb 5.2 oz (70 kg)] 154 lb 5.2 oz (70 kg) 10-09-22 0426) Last BM Date:  (Flexiseal)  Intake/Output from previous day: 03/11 0701 - 10/09/22 0700 In: 4414.4 [I.V.:1130.8; IV Piggyback:1050; TPN:2233.6] Out: 2525 [Urine:1425; Emesis/NG output:1100] Intake/Output this shift: Total I/O In: 93 [TPN:93] Out: -   Abdomen soft with right sided tenderness  Lab Results:   Recent Labs  2013-10-08 0542  WBC 9.4  HGB 9.2*  HCT 27.3*  PLT 199   BMET  Recent Labs  09/17/13 0500 10/08/2013 0542  NA 138 136*  K 3.8 4.0  CL 99 99  CO2 28 26  GLUCOSE 134* 120*  BUN 19 19  CREATININE 0.82 0.86  CALCIUM 8.5 8.2*   PT/INR No results found for this basename: LABPROT, INR,  in the last 72 hours ABG No results found for this basename: PHART, PCO2, PO2, HCO3,  in the last 72 hours  Studies/Results: Dg Abd 2 Views  2013/10/08   CLINICAL DATA Abdominal distension  EXAM ABDOMEN - 2 VIEW  COMPARISON Abdominal series of September 17, 2009  FINDINGS The bowel gas pattern is nonspecific. There are loops of very mildly distended gas-filled small bowel in the upper and mid abdomen. There is loops there are loops of normal caliber stool and gas-filled colon. A catheter like structure projects in the midline over the bony pelvis and related to the urinary bladder or rectum.  IMPRESSION There is no evidence of bowel obstruction. Very mild ileus may be present. There is no free extraluminal gas collection demonstrated.  SIGNATURE  Electronically Signed   By:  David  Martinique   On: 10-08-2013 08:16   Dg Abd Portable 1v  09/17/2013   CLINICAL DATA Evaluate position of nasogastric tube.  EXAM PORTABLE ABDOMEN - 1 VIEW  COMPARISON DG ABD 2 VIEWS dated 09/16/2013; DG ABD PORTABLE 1V dated 09/14/2013; DG ABD PORTABLE 1V dated 09/13/2013; DG ABD PORTABLE 1V dated 09/13/2013  FINDINGS Because the nasogastric tube was not visualized on the abdominal image, a portable chest image was also obtained. The nasogastric tube does not overlie the chest or the upper abdomen. Bowel gas pattern improved since the examination yesterday, with residual mild gaseous distention of a few loops of small bowel in the upper abdomen.  Right arm PICC tip projects over the lower SVC. Cardiac silhouette normal in size. Lungs clear.  IMPRESSION 1. Nasogastric tube not visualized in the chest or abdomen and has presumably been removed. 2. Interval improvement in knee small bowel obstruction, though mildly distended loops of small bowel persist in the upper abdomen. 3.  No acute cardiopulmonary disease.  SIGNATURE  Electronically Signed   By: Evangeline Dakin M.D.   On: 09/17/2013 20:32    Anti-infectives: Anti-infectives   Start     Dose/Rate Route Frequency Ordered Stop   09/11/13 2200  ceFAZolin (ANCEF) IVPB 1 g/50 mL premix  Status:  Discontinued     1 g 100 mL/hr over 30 Minutes Intravenous 3 times per day  09/11/13 1758 09/16/13 1658   09/11/13 1200  vancomycin (VANCOCIN) 50 mg/mL oral solution 500 mg  Status:  Discontinued     500 mg Per Tube 4 times per day 09/11/13 1159 09/17/13 1054   09/11/13 1000  metroNIDAZOLE (FLAGYL) IVPB 500 mg     500 mg 100 mL/hr over 60 Minutes Intravenous Every 8 hours 09/11/13 0932     09/10/13 2230  cefTRIAXone (ROCEPHIN) 1 g in dextrose 5 % 50 mL IVPB  Status:  Discontinued     1 g 100 mL/hr over 30 Minutes Intravenous Every 24 hours 09/10/13 2145 09/11/13 1758      Assessment/Plan:   Ileus and Cdiff colitis  His abd xrays are markedly improved  today.  I am going to clamp his NG and see how he responds  LOS: 8 days    Tanner [name] A 09/18/2013

## 2013-09-18 NOTE — Progress Notes (Signed)
PROGRESS NOTE    Tanner [name] EUM:[id number] DOB: November 04, 1955 DOA: 09/10/2013 PCP: Criselda Peaches, MD Primary Urologist: Dr. Rolan Bucco  HPI/Brief narrative 58 year old male with history of prostate cancer, hypertension, alcoholic hepatitis, status post radical robotic prostatectomy with bilateral pelvic lymph node dissection and lysis of colonic additions on 08/29/13, postop acute left corner radiata infarct with associated right hemiparesis on 09/02/13, transferred to inpatient rehabilitation on 09/05/13, developed nausea, vomiting, diarrhea and abdominal distention. GI was consulted and diagnosed postop ileus-narcotics were stopped, NG tube and rectal tube were placed, started on IV Reglan, found to be hypokalemic and urine cultures grew Escherichia coli 55,000 colonies. Due to worsening condition, he was transferred to the hospital on 09/10/13 for further management. He continues to have ileus and diarrhea-being managed conservatively and gradually starting to improve.  Assessment/Plan:  1. SBO:  multifactorial from adhesions, immobility, C. difficile, hypokalemia.        - CT abdomen suggested SBO with transition point.       - Continue n.p.o., NG tube clamped, rectal tube, IV fluids, aggressive potassium replacement and follow clinically.         -GI & surgery following & recommend continued conservative treatment without surgical needs.        -Started TNA 3/6.         -KUB improving        -clamped NG per CCS  2. Severe hypokalemia: Secondary to GI losses. Magnesium normal. Replaced aggressively.   3. C. difficile colitis: Continue IV Flagyl and DC PO vancomycin while NG with intermittent suction . Continues to have significant rectal tube drainage-but decreasing.       -resume PO Vanc once NG out  4. Escherichia coli UTI versus colonization: Patient is on day 7 of IV antibiotics-changed from Rocephin to cefazolin-DC. Urology input appreciated     -Foley decision to be made by  urology, s/p Radical prostatectomy  5. Hypertension: Fluctuating and uncontrolled. Continue IV metoprolol-increased to 5 mg IV every 6 hours  6. Anemia: Stable, CBC in am  7. Recent stroke with right hemiparesis:     -unable to give ASA due to NG decompression and diarrhea with rectal tube    -resume ASA when able to    -Physical therapy  8. Recent radical robotic prostatectomy: Urology following  9. Incidental finding of right ICA aneurysm: Will need followup with repeat imaging in one year.  10. Anxiety and depression: Psychiatry input on 3/9 appreciated.      -ATivan PRN  Code Status: Full Family Communication: Discussed with patient's sister at bedside Disposition Plan: DC to CIR when medically stable   Consultants:  Gastroenterology  General surgery  Urology  Procedures:  Foley catheter  NG tube  Rectal tube  Antibiotics:  IV cefazolin 3/3 > 3/4  IV Rocephin 3/4 > 3/5  IV cefazolin 3/5 > 3/10  Subjective: NG fell out last pm, replaced  Objective: Filed Vitals:   09/17/13 0436 09/17/13 1423 09/17/13 2202 09/18/13 0426  BP: 157/97 142/85 134/88 129/94  Pulse: 101 98 101 104  Temp: 97.9 F (36.6 C) 98.1 F (36.7 C) 98.6 F (37 C) 98.2 F (36.8 C)  TempSrc: Oral Oral Oral Oral  Resp: 18 18 16 16   Height:      Weight:    70 kg (154 lb 5.2 oz)  SpO2: 95% 96% 99% 94%    Intake/Output Summary (Last 24 hours) at 09/18/13 1316 Last data filed at 09/18/13 5400  Gross per 24  hour  Intake 2567.39 ml  Output   1425 ml  Net 1142.39 ml   Filed Weights   09/14/13 0510 09/15/13 0500 09/18/13 0426  Weight: 76 kg (167 lb 8.8 oz) 76 kg (167 lb 8.8 oz) 70 kg (154 lb 5.2 oz)     Exam:  General exam: Lying comfortably in bed. HEENT: NGT in situ Respiratory system: clear to auscultation. No increased work of breathing. Cardiovascular system: S1 & S2 heard, RRR. No JVD, murmurs, gallops, clicks or pedal edema.  Gastrointestinal system: Abdomen is  non-distended, soft and nontender with few bowel sounds. Copious dark brown output via NG and rectal tube-decreasing. Central nervous system: Alert and oriented. No cranial deficits. Extremities: 5 x 5 power in left limbs and 4 x 5 power in right limbs Psych: Flat affect   Data Reviewed: Basic Metabolic Panel:  Recent Labs Lab 09/12/13 1720 09/13/13 0509 09/14/13 0516 09/15/13 0500 09/16/13 0431 09/17/13 0500 09/18/13 0542  NA 144 144 151* 149* 144 138 136*  K 3.4* 3.4* 3.5* 4.2 3.8 3.8 4.0  CL 103 106 110 109 103 99 99  CO2 26 27 30 30 31 28 26   GLUCOSE 97 124* 132* 136* 135* 134* 120*  BUN 11 10 10 13 17 19 19   CREATININE 0.84 0.81 0.83 0.80 0.86 0.82 0.86  CALCIUM 8.2* 8.3* 8.3* 8.5 8.5 8.5 8.2*  MG  --  2.0 2.0 2.1  --   --  1.8  PHOS  --  2.4 3.8 3.9  --   --  4.1   Liver Function Tests:  Recent Labs Lab 09/13/13 0509 09/15/13 0500 09/18/13 0542  AST 24 16 31   ALT 27 14 25   ALKPHOS 49 40 54  BILITOT 0.2* <0.2* <0.2*  PROT 5.6* 5.7* 5.8*  ALBUMIN 2.3* 2.3* 2.3*   No results found for this basename: LIPASE, AMYLASE,  in the last 168 hours No results found for this basename: AMMONIA,  in the last 168 hours CBC:  Recent Labs Lab 09/12/13 0530 09/13/13 0509 09/15/13 0500 09/18/13 0542  WBC 9.1 9.2 9.3 9.4  NEUTROABS  --  6.9 7.2  --   HGB 10.1* 9.7* 10.0* 9.2*  HCT 30.3* 29.4* 31.1* 27.3*  MCV 96.8 98.0 101.0* 97.8  PLT 332 320 247 199   Cardiac Enzymes: No results found for this basename: CKTOTAL, CKMB, CKMBINDEX, TROPONINI,  in the last 168 hours BNP (last 3 results) No results found for this basename: PROBNP,  in the last 8760 hours CBG:  Recent Labs Lab 09/17/13 1102 09/17/13 1601 09/17/13 2326 09/18/13 0424 09/18/13 1121  GLUCAP 137* 127* 128* 126* 103*    Recent Results (from the past 240 hour(s))  CLOSTRIDIUM DIFFICILE BY PCR     Status: Abnormal   Collection Time    09/11/13 12:09 AM      Result Value Ref Range Status   C  difficile by pcr POSITIVE (*) NEGATIVE Final   Comment: CRITICAL RESULT CALLED TO, READ BACK BY AND VERIFIED WITH:     Coralyn Mark RN 09/11/13 0740 COSTELLO B       Studies: Dg Abd 2 Views  09/18/2013   CLINICAL DATA Abdominal distension  EXAM ABDOMEN - 2 VIEW  COMPARISON Abdominal series of September 17, 2009  FINDINGS The bowel gas pattern is nonspecific. There are loops of very mildly distended gas-filled small bowel in the upper and mid abdomen. There is loops there are loops of normal caliber stool and gas-filled colon. A catheter  like structure projects in the midline over the bony pelvis and related to the urinary bladder or rectum.  IMPRESSION There is no evidence of bowel obstruction. Very mild ileus may be present. There is no free extraluminal gas collection demonstrated.  SIGNATURE  Electronically Signed   By: David  Martinique   On: 09/18/2013 08:16   Dg Abd Portable 1v  09/17/2013   CLINICAL DATA Evaluate position of nasogastric tube.  EXAM PORTABLE ABDOMEN - 1 VIEW  COMPARISON DG ABD 2 VIEWS dated 09/16/2013; DG ABD PORTABLE 1V dated 09/14/2013; DG ABD PORTABLE 1V dated 09/13/2013; DG ABD PORTABLE 1V dated 09/13/2013  FINDINGS Because the nasogastric tube was not visualized on the abdominal image, a portable chest image was also obtained. The nasogastric tube does not overlie the chest or the upper abdomen. Bowel gas pattern improved since the examination yesterday, with residual mild gaseous distention of a few loops of small bowel in the upper abdomen.  Right arm PICC tip projects over the lower SVC. Cardiac silhouette normal in size. Lungs clear.  IMPRESSION 1. Nasogastric tube not visualized in the chest or abdomen and has presumably been removed. 2. Interval improvement in knee small bowel obstruction, though mildly distended loops of small bowel persist in the upper abdomen. 3.  No acute cardiopulmonary disease.  SIGNATURE  Electronically Signed   By: Evangeline Dakin M.D.   On: 09/17/2013 20:32         Scheduled Meds: . enoxaparin (LOVENOX) injection  40 mg Subcutaneous QHS  . famotidine (PEPCID) IV  20 mg Intravenous Q12H  . FLUoxetine  20 mg Oral Daily  . metoprolol  5 mg Intravenous 4 times per day  . metronidazole  500 mg Intravenous Q8H   Continuous Infusions: . sodium chloride 50 mL/hr at 09/18/13 0950  . Marland KitchenTPN (CLINIMIX-E) Adult 83 mL/hr at 09/17/13 1755   And  . fat emulsion 250 mL (09/17/13 1755)  . Marland KitchenTPN (CLINIMIX-E) Adult     And  . fat emulsion      Principal Problem:   Ileus Active Problems:   Malignant neoplasm of prostate   CVA (cerebral infarction)   Hypokalemia   C. difficile colitis   UTI (urinary tract infection)   Essential hypertension, benign    Time spent: 29 minutes    Domenic Polite, MD, [facility] Pager [phone]  If 7PM-7AM, please contact night-coverage [email or web address] Password [email or web address] 09/18/2013, 1:16 PM    LOS: 8 days

## 2013-09-18 NOTE — Progress Notes (Signed)
PARENTERAL NUTRITION CONSULT NOTE - FOLLOW UP  Pharmacy Consult:  TPN Indication:  Prolonged ileus vs SBO  Allergies  Allergen Reactions  . Dilaudid [Hydromorphone Hcl] Nausea And Vomiting    Pre pt history  . Oxycodone Nausea And Vomiting  . Procaine Hcl Other (See Comments)    Patient states he is allergic to novicaine  . Zofran [Ondansetron] Nausea And Vomiting  . Sulfa Antibiotics Rash    Patient Measurements: Height: 5\' 10"  (177.8 cm) Weight: 154 lb 5.2 oz (70 kg) IBW/kg (Calculated) : 73  Vital Signs: Temp: 98.2 F (36.8 C) (03/12 0426) Temp src: Oral (03/12 0426) BP: 129/94 mmHg (03/12 0426) Pulse Rate: 104 (03/12 0426) Intake/Output from previous day: 03/11 0701 - 03/12 0700 In: 4414.4 [I.V.:1130.8; IV Piggyback:1050; TPN:2233.6] Out: 2525 [Urine:1425; Emesis/NG output:1100]  Labs:  Recent Labs  09/18/13 0542  WBC 9.4  HGB 9.2*  HCT 27.3*  PLT 199     Recent Labs  09/16/13 0431 09/17/13 0500 09/18/13 0542  NA 144 138 136*  K 3.8 3.8 4.0  CL 103 99 99  CO2 31 28 26   GLUCOSE 135* 134* 120*  BUN 17 19 19   CREATININE 0.86 0.82 0.86  CALCIUM 8.5 8.5 8.2*  MG  --   --  1.8  PHOS  --   --  4.1  PROT  --   --  5.8*  ALBUMIN  --   --  2.3*  AST  --   --  31  ALT  --   --  25  ALKPHOS  --   --  54  BILITOT  --   --  <0.2*   Estimated Creatinine Clearance: 96.1 ml/min (by C-G formula based on Cr of 0.86).    Recent Labs  09/17/13 1601 09/17/13 2326 09/18/13 0424  GLUCAP 127* 128* 126*     Insulin Requirements in the past 24 hours:  SSI d/c'd 3/10 CBGs 126-145 to continue  Assessment: 70 YOM admitted on 08/29/13 for work-up of prostate cancer and underwent radical robotic prostatectomy.  He was diagnosed with a new CVA post-op as well as right ICA aneurysm.  Discharged to Rehab on 09/06/13 and was later found to have an ileus requiring NGT for decompression.  Returned to the acute care services on 09/10/13 for further management.  Pharmacy  consulted to manage TPN due to prolonged ileus vs SBO.  GI: hx GERD. +C.diff colitis. Baseline prealbumin low at 11.6 now 12.2. NG output 2800>>1100. Stool output none recorded but watery from rectal tube. Patient is not currently on a probiotic due to inability to take po's. KUB: Slight interval worsening of small bowel dilatation since 3/7. Overall pattern shows improvement since 03/05. Xray 3/12 show marked improvement.; if worsens, plan SBFT. IV Pepcid. 3/11: Sneezed NG tube out of place. Now not functioning properly. Try clamping.  Endo: no hx DM, A1c 5% - CBGs controlled <150 on SSI.  Lytes: Hypokalemia 4 today with goal K >= 4 for ileus), hypernatremia resolved with Na 136.  Renal: SCr 0.86 (stable), CrCL 96, UOP 0.8. Plain film cystogram once contrast clears to ensure no urine leak.    Cards: HTN - BP elevated at 129-94, HR 104, HR 104 IV Lopressor, ASA 325/tube, IV Hydralazine prn.  Hepatobil: EtOH hepatitis - LFTs / tbili WNL. Triglycerides have increased slightly from 140>>183 today. Will watch.  Neuro: anxiety / recent CVA - ASA d/c'd. 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 Depressed  ID: Treated E.coli UTI + Flagyl for +C.diff, afebrile,  WBC 9.4. PO Vanco d/c'd 3/11 to be resumed when NG tube out (getting suctioned back out)  Best Practices: Lovenox 40  TPN Access:  PICC placed 09/09/13  TPN day#: 6 (3/6 >> )  Current Nutrition:  TPN at 14ml/hr = 1894 kcal and 99.6g protein  (90% kcal, 90.5% protein goals)  Nutritional Goals:  2100-2300 kCal, 110-120 grams of protein per day   Plan:  - Clinimix E 5/15 to 83 ml/hr and continue lipids to 10 ml/hr.   - Daily IV multivitamin, folic acid, and thiamine in TPN - Trace elements on MWF only d/t national shortage - Continue CBGs but d/c SSI and monitor.    Lizbeth Feijoo S. Alford Highland, PharmD, [facility] Clinical Staff Pharmacist Pager [phone] 09/18/2013, 10:32 AM

## 2013-09-18 NOTE — Progress Notes (Signed)
Urology Progress Note  Subjective:     No acute urologic events overnight.  Positive abdominal pain since NG came out last night. It was replaced but not currently functioning properly.   ROS: Positive: RUE/RLE weakness.  Negative: Chest pain.  Objective:  Patient Vitals for the past 24 hrs:  BP Temp Temp src Pulse Resp SpO2 Weight  09/18/13 0426 129/94 mmHg 98.2 F (36.8 C) Oral 104 16 94 % 70 kg (154 lb 5.2 oz)  09/17/13 2202 134/88 mmHg 98.6 F (37 C) Oral 101 16 99 % -  09/17/13 1423 142/85 mmHg 98.1 F (36.7 C) Oral 98 18 96 % -    Physical Exam: General:  No acute distress, awake Cardiovascular:    [x]   S1/S2 present, Mildly tachycardic.  []   Irregularly irregular Chest:  CTA-B Abdomen:               []  Soft, appropriately TTP  [x]  Soft, Distended. No rebound TTP. Incisions c/d/i without erythema.  []  Soft, appropriately TTP, incision(s) clean/dry/intact  Genitourinary: Foley in place. Clear urine. Negative edema.     I/O last 3 completed shifts: In: 4504.4 [I.V.:1130.8; NG/GT:90; IV Piggyback:1050] Out: 3675 [Urine:1525; Emesis/NG output:2150]  Recent Labs     09/18/13  0542  HGB  9.2*  WBC  9.4  PLT  199    Recent Labs     09/17/13  0500  09/18/13  0542  NA  138  136*  K  3.8  4.0  CL  99  99  CO2  28  26  BUN  19  19  CREATININE  0.82  0.86  CALCIUM  8.5  8.2*  GFRNONAA  >90  >90  GFRAA  >90  >90     No results found for this basename: PT, INR, APTT,  in the last 72 hours   No components found with this basename: ABG,     Length of stay: 8 days.  Assessment: UTI. C. Diff colitis.  Robotic radical prostatectomy 08/29/13.   Plan: -NG will need to be replaced. Abdominal films show improvement.  -Continue foley until more ambulatory.  -Will follow   Rolan Bucco, MD [phone]

## 2013-09-18 NOTE — Progress Notes (Signed)
Patient ID: Tanner [name], male   DOB: Apr 20, 1956, 58 y.o.   MRN: [id number]   He has had no Nausea or bloating with NG clamped.  I don't even think it was into the stomach I will take the NG out.  Will have it put back in for N/V and bloating

## 2013-09-19 LAB — BASIC METABOLIC PANEL
BUN: 17 mg/dL (ref 6–23)
CO2: 24 mEq/L (ref 19–32)
CREATININE: 0.77 mg/dL (ref 0.50–1.35)
Calcium: 8.8 mg/dL (ref 8.4–10.5)
Chloride: 100 mEq/L (ref 96–112)
Glucose, Bld: 125 mg/dL — ABNORMAL HIGH (ref 70–99)
Potassium: 4.9 mEq/L (ref 3.7–5.3)
SODIUM: 135 meq/L — AB (ref 137–147)

## 2013-09-19 LAB — GLUCOSE, CAPILLARY
GLUCOSE-CAPILLARY: 106 mg/dL — AB (ref 70–99)
Glucose-Capillary: 111 mg/dL — ABNORMAL HIGH (ref 70–99)
Glucose-Capillary: 113 mg/dL — ABNORMAL HIGH (ref 70–99)
Glucose-Capillary: 118 mg/dL — ABNORMAL HIGH (ref 70–99)

## 2013-09-19 MED ORDER — VANCOMYCIN 50 MG/ML ORAL SOLUTION
500.0000 mg | Freq: Four times a day (QID) | ORAL | Status: DC
  Filled 2013-09-19 (×4): qty 10

## 2013-09-19 MED ORDER — FAT EMULSION 20 % IV EMUL
250.0000 mL | INTRAVENOUS | Status: AC
  Administered 2013-09-19: 250 mL via INTRAVENOUS
  Filled 2013-09-19: qty 250

## 2013-09-19 MED ORDER — ASPIRIN 325 MG PO TABS
325.0000 mg | ORAL_TABLET | Freq: Every day | ORAL | Status: DC
  Administered 2013-09-19 – 2013-09-30 (×12): 325 mg via ORAL
  Filled 2013-09-19 (×12): qty 1

## 2013-09-19 MED ORDER — ALTEPLASE 2 MG IJ SOLR
2.0000 mg | Freq: Once | INTRAMUSCULAR | Status: AC
  Administered 2013-09-19: 2 mg
  Filled 2013-09-19: qty 2

## 2013-09-19 MED ORDER — BOOST / RESOURCE BREEZE PO LIQD
1.0000 | Freq: Three times a day (TID) | ORAL | Status: DC
  Administered 2013-09-19 – 2013-09-30 (×22): 1 via ORAL

## 2013-09-19 MED ORDER — VANCOMYCIN 50 MG/ML ORAL SOLUTION
250.0000 mg | Freq: Four times a day (QID) | ORAL | Status: DC
  Administered 2013-09-19 – 2013-09-30 (×44): 250 mg via ORAL
  Filled 2013-09-19 (×50): qty 5

## 2013-09-19 MED ORDER — METOPROLOL TARTRATE 25 MG PO TABS
25.0000 mg | ORAL_TABLET | Freq: Two times a day (BID) | ORAL | Status: DC
  Administered 2013-09-19 – 2013-09-30 (×23): 25 mg via ORAL
  Filled 2013-09-19 (×24): qty 1

## 2013-09-19 MED ORDER — TRACE MINERALS CR-CU-F-FE-I-MN-MO-SE-ZN IV SOLN
INTRAVENOUS | Status: AC
  Administered 2013-09-19: 17:00:00 via INTRAVENOUS
  Filled 2013-09-19: qty 2000

## 2013-09-19 NOTE — Progress Notes (Signed)
PARENTERAL NUTRITION CONSULT NOTE - FOLLOW UP  Pharmacy Consult:  TPN Indication:  Prolonged ileus vs SBO  Allergies  Allergen Reactions  . Dilaudid [Hydromorphone Hcl] Nausea And Vomiting    Pre pt history  . Oxycodone Nausea And Vomiting  . Procaine Hcl Other (See Comments)    Patient states he is allergic to novicaine  . Zofran [Ondansetron] Nausea And Vomiting  . Sulfa Antibiotics Rash    Patient Measurements: Height: 5\' 10"  (177.8 cm) Weight: 169 lb 12.1 oz (77 kg) IBW/kg (Calculated) : 73  Vital Signs: Temp: 98.8 F (37.1 C) (03/13 0439) Temp src: Oral (03/13 0439) BP: 137/92 mmHg (03/13 0439) Pulse Rate: 109 (03/13 0439) Intake/Output from previous day: 03/12 0701 - 03/13 0700 In: 1761 [P.O.:360; I.V.:400; IV Piggyback:300; TPN:744] Out: 2725 [Urine:2175; Stool:550]  Labs:  Recent Labs  09/18/13 0542  WBC 9.4  HGB 9.2*  HCT 27.3*  PLT 199     Recent Labs  09/17/13 0500 09/18/13 0542  NA 138 136*  K 3.8 4.0  CL 99 99  CO2 28 26  GLUCOSE 134* 120*  BUN 19 19  CREATININE 0.82 0.86  CALCIUM 8.5 8.2*  MG  --  1.8  PHOS  --  4.1  PROT  --  5.8*  ALBUMIN  --  2.3*  AST  --  31  ALT  --  25  ALKPHOS  --  54  BILITOT  --  <0.2*   Estimated Creatinine Clearance: 100.2 ml/min (by C-G formula based on Cr of 0.86).    Recent Labs  09/18/13 2019 09/19/13 0014 09/19/13 0434  GLUCAP 103* 118* 111*     Insulin Requirements in the past 24 hours:  SSI d/c'd 3/10 CBGs 103*-118 to continue  Assessment: 68 YOM admitted on 08/29/13 for work-up of prostate cancer and underwent radical robotic prostatectomy.  He was diagnosed with a new CVA post-op as well as right ICA aneurysm.  Discharged to Rehab on 09/06/13 and was later found to have an ileus requiring NGT for decompression.  Returned to the acute care services on 09/10/13 for further management.  Pharmacy consulted to manage TPN due to prolonged ileus vs SBO.  GI: hx GERD. +C.diff colitis. Baseline  prealbumin low at 11.6 now 12.2. NG tube removed. Stool output 550 ml. Patient is not currently on a probiotic due to inability to take po's. KUB: Slight interval worsening of small bowel dilatation since 3/7. Overall pattern shows improvement since 03/05. Xray 3/12 show marked improvement.; if worsens, plan SBFT. IV Pepcid. 3/11:   Endo: no hx DM, A1c 5% - CBGs controlled <150 on SSI.  Lytes: Na 135, K 4.9  Renal: . Plain film cystogram once contrast clears to ensure no urine leak.  SrCr 0.77  Cards: HTN - BP elevated at 137/92, HR 109,  IV Lopressor, IV hydralazine prn  Hepatobil: EtOH hepatitis - LFTs / tbili WNL. Triglycerides have increased slightly from 140>>183 today. Will watch.  Neuro: anxiety / recent CVA - ASA d/c'd. 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 Depressed  ID: Treated E.coli UTI + Flagyl for +C.diff, afebrile, WBC 9.4. PO Vanco d/c'd 3/11 to be resumed when NG tube out (getting suctioned back out)  Best Practices: Lovenox 40  TPN Access:  PICC placed 09/09/13  TPN day#: 6 (3/6 >> )  Current Nutrition:  TPN at 66ml/hr = 1894 kcal and 99.6g protein  (90% kcal, 90.5% protein goals)  Nutritional Goals:  2100-2300 kCal, 110-120 grams of protein per day  Plan:  - Clinimix E 5/15 to 83 ml/hr and continue lipids to 10 ml/hr.   - Daily IV multivitamin, folic acid, and thiamine in TPN - Trace elements on MWF only d/t national shortage - d/c cbgs   Excell Seltzer, PharmD Clinical Staff Pharmacist Pager [phone] 09/19/2013, 9:06 AM

## 2013-09-19 NOTE — Progress Notes (Signed)
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 Now transfered back to acute for ileus vs SBO.   PT Comments   Pt moving more fluidly in and out of the bed, hindered only mildly by the rectal tube.  Standing mostly symmetrical and gait becoming more fluid with use of walker  Follow Up Recommendations  CIR     Does the patient have the potential to tolerate intense rehabilitation     Barriers to Discharge        Equipment Recommendations       Recommendations for Other Services    Frequency Min 4X/week   Progress towards PT Goals Progress towards PT goals: Progressing toward goals  Plan Current plan remains appropriate    Precautions / Restrictions Precautions Precautions: Fall Precaution Comments: multiple lines/tubes.   Pertinent Vitals/Pain     Mobility  Bed Mobility Overal bed mobility: Needs Assistance Bed Mobility: Supine to Sit;Sit to Supine Supine to sit: Min guard;HOB elevated Sit to supine: Min assist General bed mobility comments: bridging with assist; cuing and minimal assist Transfers Overall transfer level: Needs assistance Transfers: Sit to/from Stand Sit to Stand: Min assist Stand pivot transfers: Min assist General transfer comment: cued for technique to allow pt to get up with least assist Ambulation/Gait Ambulation/Gait assistance: Min assist Ambulation Distance (Feet): 170 Feet Assistive device: Rolling walker (2 wheeled) Gait Pattern/deviations: Step-through pattern;Decreased stride length Gait velocity interpretation: Below normal speed for age/gender General Gait Details: worked on improving heel toe  and decreasing use of the RW.Marland Kitchen  Noted R LE heel rotated inward on swing through as he fatigued. Modified Rankin (Stroke Patients Only) Modified Rankin: Moderately severe disability    Exercises     PT Diagnosis:    PT Problem List:   PT Treatment Interventions:     PT Goals (current goals can now be found in the care plan section) Acute Rehab PT Goals Patient Stated Goal: recover and get back to being independent PT Goal Formulation: With patient/family Time For Goal Achievement: 09/25/13 Potential to Achieve Goals: Good  Visit Information  Last PT Received On: 09/19/13 Assistance Needed: +1 History of Present Illness: 58 yo male s/p prostatectomy 2/20 for malignant neoplasm and 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 Now transfered back to acute for ileus vs SBO.    Subjective Data  Subjective: I just wish I could get all the tubes out. Patient Stated Goal: recover and get back to being independent   Cognition  Cognition Arousal/Alertness: Awake/alert Behavior During Therapy: WFL for tasks assessed/performed Overall Cognitive Status: Within Functional Limits for tasks assessed    Balance  Balance Sitting-balance support: Feet supported;No upper extremity supported Sitting balance-Leahy Scale: Fair Standing balance support: Bilateral upper extremity supported;During functional activity Standing balance-Leahy Scale: Fair  End of Session PT - End of Session Activity Tolerance: Patient limited by fatigue;Patient tolerated treatment well Patient left: in bed;with call bell/phone within reach Nurse Communication: Mobility status   GP     Nakeita Styles, Tessie Fass 09/19/2013, 4:49 PM 09/19/2013  Donnella Sham, [place] [phone]  (pager)

## 2013-09-19 NOTE — Progress Notes (Signed)
I have seen and examined the patient and agree with the assessment and plans.  Gottfried Standish A. Thomasina Housley  MD, FACS  

## 2013-09-19 NOTE — Progress Notes (Signed)
Urology Progress Note  Subjective:     No acute urologic events overnight.  He feels more abdominal distention today w/ NG out yesterday and today. Positive nausea, but manageable. Able to drink some fluids, but not much appetite.   ROS: Positive: RUE/RLE weakness.  Negative: Chest pain.  Objective:  Patient Vitals for the past 24 hrs:  BP Temp Temp src Pulse Resp SpO2 Weight  09/19/13 1431 127/81 mmHg 98.5 F (36.9 C) Oral 99 17 97 % -  09/19/13 1209 - 98.5 F (36.9 C) - - - - -  09/19/13 0439 137/92 mmHg 98.8 F (37.1 C) Oral 109 16 98 % 77 kg (169 lb 12.1 oz)  09/19/13 0017 128/89 mmHg - - - - - -  09/18/13 2041 140/89 mmHg 98.8 F (37.1 C) Oral 102 18 98 % -    Physical Exam: General:  No acute distress, awake Cardiovascular:    [x]   S1/S2 present, Mildly tachycardic.  []   Irregularly irregular Chest:  CTA-B Abdomen:               []  Soft, appropriately TTP  [x]  Soft, Distended. No rebound TTP. Incisions c/d/i without erythema.  []  Soft, appropriately TTP, incision(s) clean/dry/intact  Genitourinary: Foley in place. Clear urine. Negative edema.     I/O last 3 completed shifts: In: 3593.9 [P.O.:360; I.V.:1025.8; IV Piggyback:300] Out: 3325 [Urine:2775; Stool:550]  Recent Labs     09/18/13  0542  HGB  9.2*  WBC  9.4  PLT  199    Recent Labs     09/18/13  0542  09/19/13  0941  NA  136*  135*  K  4.0  4.9  CL  99  100  CO2  26  24  BUN  19  17  CREATININE  0.86  0.77  CALCIUM  8.2*  8.8  GFRNONAA  >90  >90  GFRAA  >90  >90     No results found for this basename: PT, INR, APTT,  in the last 72 hours   No components found with this basename: ABG,     Length of stay: 9 days.  Assessment: UTI. C. Diff colitis.  Robotic radical prostatectomy 08/29/13.   Plan: -Continue conservative management.  -Continue foley until more ambulatory.  -Will follow   Rolan Bucco, MD [phone]

## 2013-09-19 NOTE — Progress Notes (Signed)
PROGRESS NOTE    Tanner [name] ZWC:[id number] DOB: [birth year] DOA: 09/10/2013 PCP: Criselda Peaches, MD Primary Urologist: Dr. Rolan Bucco  HPI/Brief narrative 58 year old male with history of prostate cancer, hypertension, alcoholic hepatitis, status post radical robotic prostatectomy with bilateral pelvic lymph node dissection and lysis of colonic additions on 08/29/13, postop acute left corner radiata infarct with associated right hemiparesis on 09/02/13, transferred to inpatient rehabilitation on 09/05/13, developed nausea, vomiting, diarrhea and abdominal distention. GI was consulted and diagnosed postop ileus-narcotics were stopped, NG tube and rectal tube were placed, started on IV Reglan, found to be hypokalemic and urine cultures grew Escherichia coli 55,000 colonies. Due to worsening condition, he was transferred to the hospital on 09/10/13 for further management. He continues to have ileus and diarrhea-being managed conservatively and gradually starting to improve.  Assessment/Plan:  1. SBO:  multifactorial from adhesions, immobility, C. difficile, hypokalemia.        - CT abdomen suggested SBO with transition point.       - Improving       -NG out 3/12, continue clears, IVF, supportive care       -still has rectal tube         -GI & surgery following        -wean TNA 3/6.         -KUB improving  2. Severe hypokalemia: Secondary to GI losses. Magnesium normal. Replaced aggressively.   3.    C. difficile colitis: Continue IV Flagyl and resume PO vancomycin         -Dc rectal tube when improves  3. Escherichia coli UTI versus colonization: Patient was on day 7 of IV antibiotics-changed from Rocephin to cefazolin then stopped-DC. Urology input appreciated     -Foley decision to be made by urology, s/p Radical prostatectomy     -needs foley till more ambulatory  4. Hypertension: Fluctuating and uncontrolled.       -stable metoprolol to PO  5. Anemia: Stable, CBC in  am  6. Recent stroke with right hemiparesis:     -was unable to give ASA due to NG decompression and diarrhea with rectal tube    -resume ASA        -Physical therapy  7. Recent radical robotic prostatectomy: Urology following  8. Incidental finding of right ICA aneurysm: Will need followup with repeat imaging in one year.  9. Anxiety and depression: Psychiatry input on 3/9 appreciated.      -ATivan PRN  Code Status: Full Family Communication: Discussed with patient's sister at bedside Disposition Plan: DC to CIR when medically stable   Consultants:  Gastroenterology  General surgery  Urology  Procedures:  Foley catheter  NG tube  Rectal tube  Antibiotics:  IV cefazolin 3/3 > 3/4  IV Rocephin 3/4 > 3/5  IV cefazolin 3/5 > 3/10  Subjective: Tolerating clears, some nausea and and tightness  Objective: Filed Vitals:   09/18/13 2041 09/19/13 0017 09/19/13 0439 09/19/13 1209  BP: 140/89 128/89 137/92   Pulse: 102  109   Temp: 98.8 F (37.1 C)  98.8 F (37.1 C) 98.5 F (36.9 C)  TempSrc: Oral  Oral   Resp: 18  16   Height:      Weight:   77 kg (169 lb 12.1 oz)   SpO2: 98%  98%     Intake/Output Summary (Last 24 hours) at 09/19/13 1351 Last data filed at 09/19/13 1244  Gross per 24 hour  Intake 4392.15 ml  Output  2525 ml  Net 1867.15 ml   Filed Weights   09/15/13 0500 09/18/13 0426 09/19/13 0439  Weight: 76 kg (167 lb 8.8 oz) 70 kg (154 lb 5.2 oz) 77 kg (169 lb 12.1 oz)     Exam:  General exam: Lying comfortably in bed. Respiratory system: clear to auscultation. No increased work of breathing. Cardiovascular system: S1 & S2 heard, RRR. No JVD, murmurs, gallops, clicks or pedal edema.  Gastrointestinal system: Abdomen is non-distended, soft and nontender with few bowel sounds. Copious dark brown output via  rectal tube-decreasing. Central nervous system: Alert and oriented. No cranial deficits. Extremities: 5 x 5 power in left limbs and 4 x  5 power in right limbs Psych: Flat affect   Data Reviewed: Basic Metabolic Panel:  Recent Labs Lab 09/12/13 1720 09/13/13 0509 09/14/13 0516 09/15/13 0500 09/16/13 0431 09/17/13 0500 09/18/13 0542 09/19/13 0941  NA 144 144 151* 149* 144 138 136* 135*  K 3.4* 3.4* 3.5* 4.2 3.8 3.8 4.0 4.9  CL 103 106 110 109 103 99 99 100  CO2 26 27 30 30 31 28 26 24   GLUCOSE 97 124* 132* 136* 135* 134* 120* 125*  BUN 11 10 10 13 17 19 19 17   CREATININE 0.84 0.81 0.83 0.80 0.86 0.82 0.86 0.77  CALCIUM 8.2* 8.3* 8.3* 8.5 8.5 8.5 8.2* 8.8  MG  --  2.0 2.0 2.1  --   --  1.8  --   PHOS  --  2.4 3.8 3.9  --   --  4.1  --    Liver Function Tests:  Recent Labs Lab 09/13/13 0509 09/15/13 0500 09/18/13 0542  AST 24 16 31   ALT 27 14 25   ALKPHOS 49 40 54  BILITOT 0.2* <0.2* <0.2*  PROT 5.6* 5.7* 5.8*  ALBUMIN 2.3* 2.3* 2.3*   No results found for this basename: LIPASE, AMYLASE,  in the last 168 hours No results found for this basename: AMMONIA,  in the last 168 hours CBC:  Recent Labs Lab 09/13/13 0509 09/15/13 0500 09/18/13 0542  WBC 9.2 9.3 9.4  NEUTROABS 6.9 7.2  --   HGB 9.7* 10.0* 9.2*  HCT 29.4* 31.1* 27.3*  MCV 98.0 101.0* 97.8  PLT 320 247 199   Cardiac Enzymes: No results found for this basename: CKTOTAL, CKMB, CKMBINDEX, TROPONINI,  in the last 168 hours BNP (last 3 results) No results found for this basename: PROBNP,  in the last 8760 hours CBG:  Recent Labs Lab 09/18/13 1121 09/18/13 2019 09/19/13 0014 09/19/13 0434 09/19/13 1113  GLUCAP 103* 103* 118* 111* 106*    Recent Results (from the past 240 hour(s))  CLOSTRIDIUM DIFFICILE BY PCR     Status: Abnormal   Collection Time    09/11/13 12:09 AM      Result Value Ref Range Status   C difficile by pcr POSITIVE (*) NEGATIVE Final   Comment: CRITICAL RESULT CALLED TO, READ BACK BY AND VERIFIED WITH:     Coralyn Mark RN 09/11/13 0740 COSTELLO B       Studies: Dg Abd 2 Views  09/18/2013   CLINICAL DATA  Abdominal distension  EXAM ABDOMEN - 2 VIEW  COMPARISON Abdominal series of September 17, 2009  FINDINGS The bowel gas pattern is nonspecific. There are loops of very mildly distended gas-filled small bowel in the upper and mid abdomen. There is loops there are loops of normal caliber stool and gas-filled colon. A catheter like structure projects in the midline over the  bony pelvis and related to the urinary bladder or rectum.  IMPRESSION There is no evidence of bowel obstruction. Very mild ileus may be present. There is no free extraluminal gas collection demonstrated.  SIGNATURE  Electronically Signed   By: David  Martinique   On: 09/18/2013 08:16   Dg Abd Portable 1v  09/17/2013   CLINICAL DATA Evaluate position of nasogastric tube.  EXAM PORTABLE ABDOMEN - 1 VIEW  COMPARISON DG ABD 2 VIEWS dated 09/16/2013; DG ABD PORTABLE 1V dated 09/14/2013; DG ABD PORTABLE 1V dated 09/13/2013; DG ABD PORTABLE 1V dated 09/13/2013  FINDINGS Because the nasogastric tube was not visualized on the abdominal image, a portable chest image was also obtained. The nasogastric tube does not overlie the chest or the upper abdomen. Bowel gas pattern improved since the examination yesterday, with residual mild gaseous distention of a few loops of small bowel in the upper abdomen.  Right arm PICC tip projects over the lower SVC. Cardiac silhouette normal in size. Lungs clear.  IMPRESSION 1. Nasogastric tube not visualized in the chest or abdomen and has presumably been removed. 2. Interval improvement in knee small bowel obstruction, though mildly distended loops of small bowel persist in the upper abdomen. 3.  No acute cardiopulmonary disease.  SIGNATURE  Electronically Signed   By: Evangeline Dakin M.D.   On: 09/17/2013 20:32        Scheduled Meds: . aspirin  325 mg Oral Daily  . enoxaparin (LOVENOX) injection  40 mg Subcutaneous QHS  . famotidine (PEPCID) IV  20 mg Intravenous Q12H  . feeding supplement (RESOURCE BREEZE)  1 Container Oral  TID BM  . FLUoxetine  20 mg Oral Daily  . metoprolol tartrate  25 mg Oral BID  . metronidazole  500 mg Intravenous Q8H  . vancomycin  250 mg Oral 4 times per day   Continuous Infusions: . sodium chloride 50 mL/hr at 09/19/13 1244  . Marland KitchenTPN (CLINIMIX-E) Adult 83 mL/hr at 09/19/13 1244   And  . fat emulsion 250 mL (09/19/13 1244)  . Marland KitchenTPN (CLINIMIX-E) Adult     And  . fat emulsion      Principal Problem:   Ileus Active Problems:   Malignant neoplasm of prostate   CVA (cerebral infarction)   Hypokalemia   C. difficile colitis   UTI (urinary tract infection)   Essential hypertension, benign    Time spent: 80 minutes    Domenic Polite, MD, [facility] Pager [phone]  If 7PM-7AM, please contact night-coverage [email or web address] Password [email or web address] 09/19/2013, 1:51 PM    LOS: 9 days

## 2013-09-19 NOTE — Progress Notes (Signed)
NUTRITION FOLLOW UP  DOCUMENTATION CODES Per approved criteria  -Severe malnutrition in the context of acute illness or injury   Intervention:  TPN per pharmacy Resource Breeze po TID, each supplement provides 250 kcal and 9 grams of protein RD to follow for nutrition care plan  Nutrition Dx: Inadequate oral intake related to altered GI function as evidenced by Clear Liquid diet, ongoing  Goal: Pt to meet >/= 90% of their estimated nutrition needs, met  Monitor:  TPN prescription, PO intake, weight, labs, I/O's  ASSESSMENT: 58 y.o. male with PMH of prostate cancer and hypertension who has had recent prostatectomy following which patient had developed right-sided hemiplegia and was found to have acute CVA and was eventually transferred to rehabilitation where patient started developing postoperative ileus. Gastroenterologist was consulted and patient's narcotics were stopped and patient was placed on NG tube and rectal tube.   Patient also was started on IV Reglan and was also found to have hypokalemia. Patient also was found to be febrile and urine cultures Escherichia coli. Since patient's status did not improve and needed further acute care patient was transferred to [facility] cone from the rehabilitation from fourth floor. Patient on my exam denies any chest pain shortness of breath. Has distended abdomen. Has no abdominal pain at this time. Denies any fever chills. Is alert awake oriented.   Patient's diet advanced to Clear Liquids 3/12.  No nausea or bloating.  NGT discontinued.  Patient is receiving TPN via PICC line with Clinimix E 5/15 @ 83 ml/hr and lipids @ 10 ml/hr. Provides 1894 kcal and 100 grams protein per day. Meets 90% minimum estimated energy needs and 91% minimum estimated protein needs.  Height: Ht Readings from Last 1 Encounters:  09/10/13 5' 10"  (1.778 m)    Weight: Wt Readings from Last 1 Encounters:  09/19/13 169 lb 12.1 oz (77 kg)    BMI:  Body mass index  is 24.36 kg/(m^2).  Re-estimated nutrition needs: Kcal: 2100-2300 Protein: 110-120 gm Fluid: 2.1-2.3 L  Skin: Intact  Diet Order: Clear Liquid   Intake/Output Summary (Last 24 hours) at 09/19/13 1042 Last data filed at 09/19/13 0439  Gross per 24 hour  Intake   1375 ml  Output   2725 ml  Net  -1350 ml    Labs:   Recent Labs Lab 09/14/13 0516 09/15/13 0500 09/16/13 0431 09/17/13 0500 09/18/13 0542  NA 151* 149* 144 138 136*  K 3.5* 4.2 3.8 3.8 4.0  CL 110 109 103 99 99  CO2 30 30 31 28 26   BUN 10 13 17 19 19   CREATININE 0.83 0.80 0.86 0.82 0.86  CALCIUM 8.3* 8.5 8.5 8.5 8.2*  MG 2.0 2.1  --   --  1.8  PHOS 3.8 3.9  --   --  4.1  GLUCOSE 132* 136* 135* 134* 120*    CBG (last 3)   Recent Labs  09/18/13 2019 09/19/13 0014 09/19/13 0434  GLUCAP 103* 118* 111*    Scheduled Meds: . aspirin  325 mg Oral Daily  . enoxaparin (LOVENOX) injection  40 mg Subcutaneous QHS  . famotidine (PEPCID) IV  20 mg Intravenous Q12H  . FLUoxetine  20 mg Oral Daily  . metoprolol tartrate  25 mg Oral BID  . metronidazole  500 mg Intravenous Q8H  . vancomycin  500 mg Per Tube 4 times per day    Continuous Infusions: . sodium chloride 50 mL/hr at 09/19/13 0019  . Marland KitchenTPN (CLINIMIX-E) Adult 83 mL/hr at 09/18/13  1739   And  . fat emulsion 250 mL (09/18/13 1740)    Past Medical History  Diagnosis Date  . Alcoholic hepatitis   . Hypertension   . 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 Shortness of breath     new onset- occasionally-at rest and exertion  . GERD (gastroesophageal reflux disease)   . Anxiety   . TIA (transient ischemic attack) 08/2013  . Arthritis     " in my spine "  . Ileus 09/2013    Past Surgical History  Procedure Laterality Date  . Hand surgery Right   . Hernia repair    . Orif ankle fracture Right 12/10/2012    Procedure: OPEN REDUCTION INTERNAL FIXATION (ORIF) ANKLE FRACTURE;  Surgeon: Hessie Dibble, MD;  Location: [facility];  Service:  Orthopedics;  Laterality: Right;  . Robot assisted laparoscopic radical prostatectomy N/A 08/29/2013    Procedure: ROBOTIC ASSISTED LAPAROSCOPIC RADICAL PROSTATECTOMY LEVEL 2, Lysis of adhesions;  Surgeon: Molli Hazard, MD;  Location: [facility];  Service: Urology;  Laterality: N/A;  . Lymphadenectomy Bilateral 08/29/2013    Procedure: LYMPHADENECTOMY "BILATERAL PELVIC LYMPH NODE DISSECTION";  Surgeon: Molli Hazard, MD;  Location: [facility];  Service: Urology;  Laterality: Bilateral;    Arthur Holms, RD, LDN Pager #: [phone] After-Hours Pager #: [phone]

## 2013-09-19 NOTE — Progress Notes (Signed)
Patient ID: Tanner [name], male   DOB: [birth year], 58 y.o.   MRN: [id number]    Subjective: Pt feels ok today.  A bit more bloated.  Minimal nausea yesterday, none today.  Still passing stool from below.  Starting to thicken up.  A very strong smell of pseudomonas hits you when you walk in the room.  Objective: Vital signs in last 24 hours: Temp:  [98.4 F (36.9 C)-98.8 F (37.1 C)] 98.8 F (37.1 C) (03/13 0439) Pulse Rate:  [102-109] 109 (03/13 0439) Resp:  [16-18] 16 (03/13 0439) BP: (128-152)/(89-99) 137/92 mmHg (03/13 0439) SpO2:  [97 %-98 %] 98 % (03/13 0439) Weight:  [169 lb 12.1 oz (77 kg)] 169 lb 12.1 oz (77 kg) (03/13 0439) Last BM Date:  (flexiseal)  Intake/Output from previous day: 03/12 0701 - 03/13 0700 In: 1804 [P.O.:360; I.V.:400; IV Piggyback:300; TPN:744] Out: 2725 [Urine:2175; Stool:550] Intake/Output this shift:    PE: Abd: soft, mild increase in distention, +BS, mild RUQ tenderness.  No definite source of pseudomonas smell identified  Lab Results:   Recent Labs  09/18/13 0542  WBC 9.4  HGB 9.2*  HCT 27.3*  PLT 199   BMET  Recent Labs  09/17/13 0500 09/18/13 0542  NA 138 136*  K 3.8 4.0  CL 99 99  CO2 28 26  GLUCOSE 134* 120*  BUN 19 19  CREATININE 0.82 0.86  CALCIUM 8.5 8.2*   PT/INR No results found for this basename: LABPROT, INR,  in the last 72 hours CMP     Component Value Date/Time   NA 136* 09/18/2013 0542   K 4.0 09/18/2013 0542   CL 99 09/18/2013 0542   CO2 26 09/18/2013 0542   GLUCOSE 120* 09/18/2013 0542   BUN 19 09/18/2013 0542   CREATININE 0.86 09/18/2013 0542   CALCIUM 8.2* 09/18/2013 0542   PROT 5.8* 09/18/2013 0542   ALBUMIN 2.3* 09/18/2013 0542   AST 31 09/18/2013 0542   ALT 25 09/18/2013 0542   ALKPHOS 54 09/18/2013 0542   BILITOT <0.2* 09/18/2013 0542   GFRNONAA >90 09/18/2013 0542   GFRAA >90 09/18/2013 0542   Lipase     Component Value Date/Time   LIPASE 14 09/06/2013 1040       Studies/Results: Dg Abd 2  Views  09/18/2013   CLINICAL DATA Abdominal distension  EXAM ABDOMEN - 2 VIEW  COMPARISON Abdominal series of September 17, 2009  FINDINGS The bowel gas pattern is nonspecific. There are loops of very mildly distended gas-filled small bowel in the upper and mid abdomen. There is loops there are loops of normal caliber stool and gas-filled colon. A catheter like structure projects in the midline over the bony pelvis and related to the urinary bladder or rectum.  IMPRESSION There is no evidence of bowel obstruction. Very mild ileus may be present. There is no free extraluminal gas collection demonstrated.  SIGNATURE  Electronically Signed   By: David  Martinique   On: 09/18/2013 08:16   Dg Abd Portable 1v  09/17/2013   CLINICAL DATA Evaluate position of nasogastric tube.  EXAM PORTABLE ABDOMEN - 1 VIEW  COMPARISON DG ABD 2 VIEWS dated 09/16/2013; DG ABD PORTABLE 1V dated 09/14/2013; DG ABD PORTABLE 1V dated 09/13/2013; DG ABD PORTABLE 1V dated 09/13/2013  FINDINGS Because the nasogastric tube was not visualized on the abdominal image, a portable chest image was also obtained. The nasogastric tube does not overlie the chest or the upper abdomen. Bowel gas pattern improved since the examination yesterday,  with residual mild gaseous distention of a few loops of small bowel in the upper abdomen.  Right arm PICC tip projects over the lower SVC. Cardiac silhouette normal in size. Lungs clear.  IMPRESSION 1. Nasogastric tube not visualized in the chest or abdomen and has presumably been removed. 2. Interval improvement in knee small bowel obstruction, though mildly distended loops of small bowel persist in the upper abdomen. 3.  No acute cardiopulmonary disease.  SIGNATURE  Electronically Signed   By: Evangeline Dakin M.D.   On: 09/17/2013 20:32    Anti-infectives: Anti-infectives   Start     Dose/Rate Route Frequency Ordered Stop   09/19/13 1200  vancomycin (VANCOCIN) 50 mg/mL oral solution 500 mg     500 mg Per Tube 4 times  per day 09/19/13 1011     09/11/13 2200  ceFAZolin (ANCEF) IVPB 1 g/50 mL premix  Status:  Discontinued     1 g 100 mL/hr over 30 Minutes Intravenous 3 times per day 09/11/13 1758 09/16/13 1658   09/11/13 1200  vancomycin (VANCOCIN) 50 mg/mL oral solution 500 mg  Status:  Discontinued     500 mg Per Tube 4 times per day 09/11/13 1159 09/17/13 1054   09/11/13 1000  metroNIDAZOLE (FLAGYL) IVPB 500 mg     500 mg 100 mL/hr over 60 Minutes Intravenous Every 8 hours 09/11/13 0932     09/10/13 2230  cefTRIAXone (ROCEPHIN) 1 g in dextrose 5 % 50 mL IVPB  Status:  Discontinued     1 g 100 mL/hr over 30 Minutes Intravenous Every 24 hours 09/10/13 2145 09/11/13 1758       Assessment/Plan  1. Adynamic ileus  Plan: 1. Given some increase in distention we will be conservative and leave on clear liquids today given prolonged course to get to this point.  Hopefully if he continues to do well today, then we can advance him tomorrow.   LOS: 9 days    Eustacio Ellen E 09/19/2013, 10:48 AM Pager: [phone]

## 2013-09-20 DIAGNOSIS — R279 Unspecified lack of coordination: Secondary | ICD-10-CM

## 2013-09-20 LAB — CBC
HEMATOCRIT: 26.6 % — AB (ref 39.0–52.0)
HEMOGLOBIN: 8.9 g/dL — AB (ref 13.0–17.0)
MCH: 32.1 pg (ref 26.0–34.0)
MCHC: 33.5 g/dL (ref 30.0–36.0)
MCV: 96 fL (ref 78.0–100.0)
Platelets: 200 10*3/uL (ref 150–400)
RBC: 2.77 MIL/uL — ABNORMAL LOW (ref 4.22–5.81)
RDW: 13.1 % (ref 11.5–15.5)
WBC: 7.8 10*3/uL (ref 4.0–10.5)

## 2013-09-20 LAB — GLUCOSE, CAPILLARY
GLUCOSE-CAPILLARY: 115 mg/dL — AB (ref 70–99)
GLUCOSE-CAPILLARY: 104 mg/dL — AB (ref 70–99)
Glucose-Capillary: 112 mg/dL — ABNORMAL HIGH (ref 70–99)

## 2013-09-20 LAB — BASIC METABOLIC PANEL
BUN: 15 mg/dL (ref 6–23)
CALCIUM: 8.6 mg/dL (ref 8.4–10.5)
CO2: 22 mEq/L (ref 19–32)
CREATININE: 0.77 mg/dL (ref 0.50–1.35)
Chloride: 101 mEq/L (ref 96–112)
GFR calc Af Amer: >90 mL/min (ref >90)
GLUCOSE: 115 mg/dL — AB (ref 70–99)
Potassium: 4.4 mEq/L (ref 3.7–5.3)
SODIUM: 135 meq/L — AB (ref 137–147)

## 2013-09-20 MED ORDER — THIAMINE HCL 100 MG/ML IJ SOLN
INTRAVENOUS | Status: AC
  Administered 2013-09-20: 17:00:00 via INTRAVENOUS
  Filled 2013-09-20: qty 2000

## 2013-09-20 MED ORDER — FAT EMULSION 20 % IV EMUL
250.0000 mL | INTRAVENOUS | Status: AC
  Administered 2013-09-20: 250 mL via INTRAVENOUS
  Filled 2013-09-20: qty 250

## 2013-09-20 MED ORDER — FAMOTIDINE 20 MG PO TABS
20.0000 mg | ORAL_TABLET | Freq: Two times a day (BID) | ORAL | Status: DC
  Administered 2013-09-20 – 2013-09-30 (×20): 20 mg via ORAL
  Filled 2013-09-20 (×22): qty 1

## 2013-09-20 NOTE — Progress Notes (Signed)
Subjective: Pt with no acute changes overnight. Some nausea in between meals.  Tol CLD  slowly Unsure if he's passing flatus as he has flexiseal in  Objective: Vital signs in last 24 hours: Temp:  [98.5 F (36.9 C)-98.7 F (37.1 C)] 98.5 F (36.9 C) (03/14 0523) Pulse Rate:  [90-99] 90 (03/14 0523) Resp:  [17-19] 19 (03/14 0523) BP: (127-145)/(81-88) 139/86 mmHg (03/14 0523) SpO2:  [97 %-100 %] 98 % (03/14 0523) Weight:  [167 lb 8.8 oz (76 kg)] 167 lb 8.8 oz (76 kg) (03/14 0544) Last BM Date: 09/19/13 (pt has flexiseal)  Intake/Output from previous day: 03/13 0701 - 03/14 0700 In: 4410.3 [P.O.:580; I.V.:1200; IV Piggyback:400; TPN:2230.3] Out: 2800 [Urine:2800] Intake/Output this shift:    General appearance: alert and cooperative GI: soft, min dist, nttp, min BS  Lab Results:   Recent Labs  10-06-13 0542 09/20/13 0520  WBC 9.4 7.8  HGB 9.2* 8.9*  HCT 27.3* 26.6*  PLT 199 200   BMET  Recent Labs  09/19/13 0941 09/20/13 0520  NA 135* 135*  K 4.9 4.4  CL 100 101  CO2 24 22  GLUCOSE 125* 115*  BUN 17 15  CREATININE 0.77 0.77  CALCIUM 8.8 8.6   PT/INR No results found for this basename: LABPROT, INR,  in the last 72 hours ABG No results found for this basename: PHART, PCO2, PO2, HCO3,  in the last 72 hours  Studies/Results: Dg Abd 2 Views  10-06-2013   CLINICAL DATA Abdominal distension  EXAM ABDOMEN - 2 VIEW  COMPARISON Abdominal series of September 17, 2009  FINDINGS The bowel gas pattern is nonspecific. There are loops of very mildly distended gas-filled small bowel in the upper and mid abdomen. There is loops there are loops of normal caliber stool and gas-filled colon. A catheter like structure projects in the midline over the bony pelvis and related to the urinary bladder or rectum.  IMPRESSION There is no evidence of bowel obstruction. Very mild ileus may be present. There is no free extraluminal gas collection demonstrated.  SIGNATURE  Electronically  Signed   By: David  Martinique   On: 10-06-2013 08:16    Anti-infectives: Anti-infectives   Start     Dose/Rate Route Frequency Ordered Stop   09/19/13 1200  vancomycin (VANCOCIN) 50 mg/mL oral solution 500 mg  Status:  Discontinued     500 mg Per Tube 4 times per day 09/19/13 1011 09/19/13 1105   09/19/13 1200  vancomycin (VANCOCIN) 50 mg/mL oral solution 250 mg     250 mg Oral 4 times per day 09/19/13 1105     09/11/13 2200  ceFAZolin (ANCEF) IVPB 1 g/50 mL premix  Status:  Discontinued     1 g 100 mL/hr over 30 Minutes Intravenous 3 times per day 09/11/13 1758 09/16/13 1658   09/11/13 1200  vancomycin (VANCOCIN) 50 mg/mL oral solution 500 mg  Status:  Discontinued     500 mg Per Tube 4 times per day 09/11/13 1159 09/17/13 1054   09/11/13 1000  metroNIDAZOLE (FLAGYL) IVPB 500 mg     500 mg 100 mL/hr over 60 Minutes Intravenous Every 8 hours 09/11/13 0932     09/10/13 2230  cefTRIAXone (ROCEPHIN) 1 g in dextrose 5 % 50 mL IVPB  Status:  Discontinued     1 g 100 mL/hr over 30 Minutes Intravenous Every 24 hours 09/10/13 2145 09/11/13 1758      Assessment/Plan: 1. Adynamic ileus  Con't CLD for now as he  still feels a little bloated/distended. Encouraged Ambulation    LOS: 10 days    Rosario Jacks., [facility] 09/20/2013

## 2013-09-20 NOTE — Progress Notes (Signed)
PROGRESS NOTE    Tanner [name] ATF:[id number] DOB: Jan 29, 1956 DOA: 09/10/2013 PCP: Criselda Peaches, MD Primary Urologist: Dr. Rolan Bucco  HPI/Brief narrative 58 year old male with history of prostate cancer, hypertension, alcoholic hepatitis, status post radical robotic prostatectomy with bilateral pelvic lymph node dissection and lysis of colonic additions on 08/29/13, postop acute left corner radiata infarct with associated right hemiparesis on 09/02/13, transferred to inpatient rehabilitation on 09/05/13, developed nausea, vomiting, diarrhea and abdominal distention. GI was consulted and diagnosed postop ileus-narcotics were stopped, NG tube and rectal tube were placed, started on IV Reglan, found to be hypokalemic and urine cultures grew Escherichia coli 55,000 colonies. Due to worsening condition, he was transferred to the hospital on 09/10/13 for further management. He continues to have ileus and diarrhea-being managed conservatively and gradually starting to improve.  Assessment/Plan:  1. SBO:  multifactorial from adhesions, immobility, C. difficile, hypokalemia.        - CT abdomen suggested SBO with transition point.       - Improving       -NG out 3/12, c       -Remains on clears, IVF, supportive care       -still has rectal tube and liquid stool        -GI & surgery following        -wean TNA when PO intake improves         -KUB improving  2. Severe hypokalemia: Secondary to GI losses. Magnesium normal. Replaced aggressively.   3.    C. difficile colitis: Continue IV Flagyl and resumed PO vancomycin 3/13        -Dc rectal tube when improves  3. Escherichia coli UTI versus colonization: Patient was on day 7 of IV antibiotics-changed from Rocephin to cefazolin then stopped-DC. Urology input appreciated     -Foley decision to be made by urology, s/p Radical prostatectomy     -needs foley till more ambulatory  4. Hypertension: Fluctuating and uncontrolled.       -stable  metoprolol to PO  5. Anemia: Stable, CBC in am  6. Recent stroke with right hemiparesis:     -was unable to give ASA due to NG decompression and diarrhea with rectal tube    -resume ASA        -Physical therapy  7. Recent radical robotic prostatectomy: Urology following  8. Incidental finding of right ICA aneurysm: Will need followup with repeat imaging in one year.  9. Anxiety and depression: Psychiatry input on 3/9 appreciated.      -ATivan PRN  Code Status: Full Family Communication: Discussed with patient's sister at bedside Disposition Plan: DC to CIR when medically stable   Consultants:  Gastroenterology  General surgery  Urology  Procedures:  Foley catheter  NG tube  Rectal tube  Antibiotics:  IV cefazolin 3/3 > 3/4  IV Rocephin 3/4 > 3/5  IV cefazolin 3/5 > 3/10  Subjective: Tolerating clears, some nausea and and tightness  Objective: Filed Vitals:   09/19/13 2003 09/20/13 0523 09/20/13 0544 09/20/13 0900  BP: 145/88 139/86  145/89  Pulse: 92 90  98  Temp: 98.7 F (37.1 C) 98.5 F (36.9 C)    TempSrc: Oral Oral    Resp: 18 19    Height:      Weight:   76 kg (167 lb 8.8 oz)   SpO2: 100% 98%      Intake/Output Summary (Last 24 hours) at 09/20/13 1128 Last data filed at 09/20/13 2706  Gross  per 24 hour  Intake 3870.28 ml  Output   2300 ml  Net 1570.28 ml   Filed Weights   09/18/13 0426 09/19/13 0439 09/20/13 0544  Weight: 70 kg (154 lb 5.2 oz) 77 kg (169 lb 12.1 oz) 76 kg (167 lb 8.8 oz)     Exam:  General exam: Lying comfortably in bed. Respiratory system: clear to auscultation. No increased work of breathing. Cardiovascular system: S1 & S2 heard, RRR. No JVD, murmurs, gallops, clicks or pedal edema.  Gastrointestinal system: Abdomen is non-distended, soft and nontender with few bowel sounds. Copious dark brown output via  rectal tube-decreasing. Central nervous system: Alert and oriented. No cranial deficits. Extremities: 5 x 5  power in left limbs and 4 x 5 power in right limbs Psych: Flat affect   Data Reviewed: Basic Metabolic Panel:  Recent Labs Lab 09/14/13 0516 09/15/13 0500 09/16/13 0431 09/17/13 0500 09/18/13 0542 09/19/13 0941 09/20/13 0520  NA 151* 149* 144 138 136* 135* 135*  K 3.5* 4.2 3.8 3.8 4.0 4.9 4.4  CL 110 109 103 99 99 100 101  CO2 30 30 31 28 26 24 22   GLUCOSE 132* 136* 135* 134* 120* 125* 115*  BUN 10 13 17 19 19 17 15   CREATININE 0.83 0.80 0.86 0.82 0.86 0.77 0.77  CALCIUM 8.3* 8.5 8.5 8.5 8.2* 8.8 8.6  MG 2.0 2.1  --   --  1.8  --   --   PHOS 3.8 3.9  --   --  4.1  --   --    Liver Function Tests:  Recent Labs Lab 09/15/13 0500 09/18/13 0542  AST 16 31  ALT 14 25  ALKPHOS 40 54  BILITOT <0.2* <0.2*  PROT 5.7* 5.8*  ALBUMIN 2.3* 2.3*   No results found for this basename: LIPASE, AMYLASE,  in the last 168 hours No results found for this basename: AMMONIA,  in the last 168 hours CBC:  Recent Labs Lab 09/15/13 0500 09/18/13 0542 09/20/13 0520  WBC 9.3 9.4 7.8  NEUTROABS 7.2  --   --   HGB 10.0* 9.2* 8.9*  HCT 31.1* 27.3* 26.6*  MCV 101.0* 97.8 96.0  PLT 247 199 200   Cardiac Enzymes: No results found for this basename: CKTOTAL, CKMB, CKMBINDEX, TROPONINI,  in the last 168 hours BNP (last 3 results) No results found for this basename: PROBNP,  in the last 8760 hours CBG:  Recent Labs Lab 09/19/13 0434 09/19/13 1113 09/19/13 1942 09/20/13 0043 09/20/13 0559  GLUCAP 111* 106* 113* 112* 115*    Recent Results (from the past 240 hour(s))  CLOSTRIDIUM DIFFICILE BY PCR     Status: Abnormal   Collection Time    09/11/13 12:09 AM      Result Value Ref Range Status   C difficile by pcr POSITIVE (*) NEGATIVE Final   Comment: CRITICAL RESULT CALLED TO, READ BACK BY AND VERIFIED WITH:     SHELTON H RN 09/11/13 0740 COSTELLO B       Studies: No results found.      Scheduled Meds: . aspirin  325 mg Oral Daily  . enoxaparin (LOVENOX) injection  40  mg Subcutaneous QHS  . famotidine  20 mg Oral BID  . feeding supplement (RESOURCE BREEZE)  1 Container Oral TID BM  . FLUoxetine  20 mg Oral Daily  . metoprolol tartrate  25 mg Oral BID  . metronidazole  500 mg Intravenous Q8H  . vancomycin  250 mg Oral 4  times per day   Continuous Infusions: . sodium chloride 50 mL/hr at 09/19/13 1500  . Marland KitchenTPN (CLINIMIX-E) Adult 83 mL/hr at 09/19/13 1710   And  . fat emulsion 250 mL (09/19/13 1709)  . Marland KitchenTPN (CLINIMIX-E) Adult     And  . fat emulsion      Principal Problem:   Ileus Active Problems:   Malignant neoplasm of prostate   CVA (cerebral infarction)   Hypokalemia   C. difficile colitis   UTI (urinary tract infection)   Essential hypertension, benign    Time spent: 67 minutes    Domenic Polite, MD, [facility] Pager [phone]  If 7PM-7AM, please contact night-coverage [email or web address] Password [email or web address] 09/20/2013, 11:28 AM    LOS: 10 days

## 2013-09-20 NOTE — Progress Notes (Signed)
PARENTERAL NUTRITION CONSULT NOTE - FOLLOW UP  Pharmacy Consult:  TPN Indication:  Prolonged ileus vs SBO  Allergies  Allergen Reactions  . Dilaudid [Hydromorphone Hcl] Nausea And Vomiting    Pre pt history  . Oxycodone Nausea And Vomiting  . Procaine Hcl Other (See Comments)    Patient states he is allergic to novicaine  . Zofran [Ondansetron] Nausea And Vomiting  . Sulfa Antibiotics Rash    Patient Measurements: Height: 5\' 10"  (177.8 cm) Weight: 167 lb 8.8 oz (76 kg) IBW/kg (Calculated) : 73  Vital Signs: Temp: 98.5 F (36.9 C) (03/14 0523) Temp src: Oral (03/14 0523) BP: 145/89 mmHg (03/14 0900) Pulse Rate: 98 (03/14 0900) Intake/Output from previous day: 03/13 0701 - 03/14 0700 In: 4410.3 [P.O.:580; I.V.:1200; IV Piggyback:400; TPN:2230.3] Out: 2800 [Urine:2800]  Labs:  Recent Labs  09/18/13 0542 09/20/13 0520  WBC 9.4 7.8  HGB 9.2* 8.9*  HCT 27.3* 26.6*  PLT 199 200     Recent Labs  09/18/13 0542 09/19/13 0941 09/20/13 0520  NA 136* 135* 135*  K 4.0 4.9 4.4  CL 99 100 101  CO2 26 24 22   GLUCOSE 120* 125* 115*  BUN 19 17 15   CREATININE 0.86 0.77 0.77  CALCIUM 8.2* 8.8 8.6  MG 1.8  --   --   PHOS 4.1  --   --   PROT 5.8*  --   --   ALBUMIN 2.3*  --   --   AST 31  --   --   ALT 25  --   --   ALKPHOS 54  --   --   BILITOT <0.2*  --   --    Estimated Creatinine Clearance: 107.7 ml/min (by C-G formula based on Cr of 0.77).    Recent Labs  09/19/13 1942 09/20/13 0043 09/20/13 0559  GLUCAP 113* 112* 115*     Insulin Requirements in the past 24 hours:  SSI d/c'd 3/10 CBGs 106-115 to continue  Assessment: 4 YOM admitted on 08/29/13 for work-up of prostate cancer and underwent radical robotic prostatectomy.  He was diagnosed with a new CVA post-op as well as right ICA aneurysm.  Discharged to Rehab on 09/06/13 and was later found to have an ileus requiring NGT for decompression.  Returned to the acute care services on 09/10/13 for further  management.  Pharmacy consulted to manage TPN due to prolonged ileus vs SBO.  GI: hx GERD. +C.diff colitis. Baseline prealbumin low at 11.6 now 12.2. Patient is not currently on a probiotic due to inability to take po's. KUB: Slight interval worsening of small bowel dilatation since 3/7. Overall pattern shows improvement since 03/05. Xray 3/12 show marked improvement; if worsens, plan SBFT. IV Pepcid. NG tube out, but flexiseal remains. On clear liquid diet with some nausea between meals. Do no progress diet today.  Endo: no hx DM, A1c 5% - CBGs controlled <150 on SSI.  Lytes: Na 135, K 4.4  Renal: . Plain film cystogram once contrast clears to ensure no urine leak.  SrCr 0.77. UOP 1.5  Cards: HTN - BP elevated 145/89, HR 98, on IV hydralazine prn, resumed po ASA325 and metoprolol.  Hepatobil: EtOH hepatitis - LFTs / tbili WNL. Triglycerides have increased slightly from 140>>183 today. Will watch.  Neuro: anxiety / recent CVA . 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 Depressed  ID: Treated E.coli UTI + Flagyl for +C.diff, afebrile, WBC 7.8. PO Vanco  Resumed.  Best Practices: Lovenox 40  TPN Access:  PICC placed  09/09/13  TPN day#: 8 (3/6 >> )  Current Nutrition:  TPN at 6ml/hr = 1894 kcal and 99.6g protein  (90% kcal, 90.5% protein goals)  Nutritional Goals:  2100-2300 kCal, 110-120 grams of protein per day   Plan:  - Clinimix E 5/15 to 83 ml/hr and continue lipids to 10 ml/hr.   - Daily IV multivitamin, folic acid, and thiamine in TPN - Trace elements on MWF only d/t Dietitian S. Alford Highland, PharmD, [place] Clinical Staff Pharmacist Pager [phone]  09/20/2013, 11:18 AM

## 2013-09-20 NOTE — Progress Notes (Signed)
  Subjective: Patient reports no significant change. He continues to be down about the chain of events but really reports no new clinical concerns. Foley catheter continues to drain well. He is on a clear liquid diet which he appears to be tolerating okay.  Objective: Vital signs in last 24 hours: Temp:  [98.5 F (36.9 C)-98.7 F (37.1 C)] 98.5 F (36.9 C) (03/14 0523) Pulse Rate:  [90-99] 98 (03/14 0900) Resp:  [17-19] 19 (03/14 0523) BP: (127-145)/(81-89) 145/89 mmHg (03/14 0900) SpO2:  [97 %-100 %] 98 % (03/14 0523) Weight:  [167 lb 8.8 oz (76 kg)] 167 lb 8.8 oz (76 kg) (03/14 0544)  Intake/Output from previous day: 03/13 0701 - 03/14 0700 In: 4410.3 [P.O.:580; I.V.:1200; IV Piggyback:400; TPN:2230.3] Out: 2800 [Urine:2800] Intake/Output this shift:    Physical Exam:  Constitutional: Vital signs reviewed. WD WN in NAD   Eyes: PERRL, No scleral icterus.   Cardiovascular: RRR Pulmonary/Chest: Normal effort Abdominal: Soft. Non-tender incisions all appear to be healing appropriately Genitourinary: Foley catheter indwelling Extremities: No cyanosis or edema   Lab Results:  Recent Labs  09/18/13 0542 09/20/13 0520  HGB 9.2* 8.9*  HCT 27.3* 26.6*   BMET  Recent Labs  09/19/13 0941 09/20/13 0520  NA 135* 135*  K 4.9 4.4  CL 100 101  CO2 24 22  GLUCOSE 125* 115*  BUN 17 15  CREATININE 0.77 0.77  CALCIUM 8.8 8.6   No results found for this basename: LABPT, INR,  in the last 72 hours No results found for this basename: LABURIN,  in the last 72 hours Results for orders placed during the hospital encounter of 09/10/13  CLOSTRIDIUM DIFFICILE BY PCR     Status: Abnormal   Collection Time    09/11/13 12:09 AM      Result Value Ref Range Status   C difficile by pcr POSITIVE (*) NEGATIVE Final   Comment: CRITICAL RESULT CALLED TO, READ BACK BY AND VERIFIED WITH:     Coralyn Mark RN 09/11/13 0740 COSTELLO B    Studies/Results: No results  found.  Assessment/Plan:   No obvious change from a urologic perspective. Hopefully Foley catheter will be removed in the next couple of days as he does continue to clinically improve.   LOS: 10 days   Yianna Tersigni S 09/20/2013, 1:35 PM

## 2013-09-21 LAB — BASIC METABOLIC PANEL
BUN: 16 mg/dL (ref 6–23)
CALCIUM: 8.6 mg/dL (ref 8.4–10.5)
CO2: 22 mEq/L (ref 19–32)
CREATININE: 0.74 mg/dL (ref 0.50–1.35)
Chloride: 102 mEq/L (ref 96–112)
GFR calc non Af Amer: >90 mL/min (ref >90)
Glucose, Bld: 119 mg/dL — ABNORMAL HIGH (ref 70–99)
Potassium: 4.3 mEq/L (ref 3.7–5.3)
Sodium: 134 mEq/L — ABNORMAL LOW (ref 137–147)

## 2013-09-21 LAB — GLUCOSE, CAPILLARY
GLUCOSE-CAPILLARY: 134 mg/dL — AB (ref 70–99)
Glucose-Capillary: 121 mg/dL — ABNORMAL HIGH (ref 70–99)

## 2013-09-21 MED ORDER — THIAMINE HCL 100 MG/ML IJ SOLN
INTRAVENOUS | Status: AC
  Administered 2013-09-21: 18:00:00 via INTRAVENOUS
  Filled 2013-09-21: qty 2000

## 2013-09-21 MED ORDER — LORAZEPAM 1 MG PO TABS
1.0000 mg | ORAL_TABLET | Freq: Four times a day (QID) | ORAL | Status: DC | PRN
  Administered 2013-09-21 – 2013-09-30 (×28): 1 mg via ORAL
  Filled 2013-09-21 (×29): qty 1

## 2013-09-21 MED ORDER — HYDROCODONE-ACETAMINOPHEN 5-325 MG PO TABS
1.0000 | ORAL_TABLET | Freq: Four times a day (QID) | ORAL | Status: DC | PRN
  Administered 2013-09-22: 1 via ORAL
  Filled 2013-09-21: qty 1

## 2013-09-21 MED ORDER — FAT EMULSION 20 % IV EMUL
250.0000 mL | INTRAVENOUS | Status: AC
  Administered 2013-09-21: 250 mL via INTRAVENOUS
  Filled 2013-09-21: qty 250

## 2013-09-21 NOTE — Progress Notes (Signed)
PARENTERAL NUTRITION CONSULT NOTE - FOLLOW UP  Pharmacy Consult:  TPN Indication:  Prolonged ileus vs SBO  Allergies  Allergen Reactions  . Dilaudid [Hydromorphone Hcl] Nausea And Vomiting    Pre pt history  . Oxycodone Nausea And Vomiting  . Procaine Hcl Other (See Comments)    Patient states he is allergic to novicaine  . Zofran [Ondansetron] Nausea And Vomiting  . Sulfa Antibiotics Rash    Patient Measurements: Height: 5\' 10"  (177.8 cm) Weight: 165 lb 5.5 oz (75 kg) IBW/kg (Calculated) : 73  Vital Signs: Temp: 98 F (36.7 C) (03/15 0510) Temp src: Oral (03/15 0510) BP: 145/91 mmHg (03/15 0725) Pulse Rate: 94 (03/15 0725) Intake/Output from previous day: 03/14 0701 - 03/15 0700 In: 2190 [P.O.:240; I.V.:1950] Out: 0300 [Urine:3525]  Labs:  Recent Labs  09/20/13 0520  WBC 7.8  HGB 8.9*  HCT 26.6*  PLT 200     Recent Labs  09/19/13 0941 09/20/13 0520 09/21/13 0500  NA 135* 135* 134*  K 4.9 4.4 4.3  CL 100 101 102  CO2 24 22 22   GLUCOSE 125* 115* 119*  BUN 17 15 16   CREATININE 0.77 0.77 0.74  CALCIUM 8.8 8.6 8.6   Estimated Creatinine Clearance: 107.7 ml/min (by C-G formula based on Cr of 0.74).    Recent Labs  09/20/13 1631 09/21/13 0014 09/21/13 0507  GLUCAP 104* 134* 121*     Insulin Requirements in the past 24 hours:  SSI d/c'd 3/10 CBGs 104-121 to continue  Assessment: 7 YOM admitted on 08/29/13 for work-up of prostate cancer and underwent radical robotic prostatectomy.  He was diagnosed with a new CVA post-op as well as right ICA aneurysm.  Discharged to Rehab on 09/06/13 and was later found to have an ileus requiring NGT for decompression.  Returned to the acute care services on 09/10/13 for further management.  Pharmacy consulted to manage TPN due to prolonged ileus vs SBO.  GI: hx GERD. +C.diff colitis. Baseline prealbumin low at 11.6 now 12.2.  KUB: Xray 3/12 show marked improvement; if worsens, plan SBFT. NG tube out, but flexiseal  remains. On clear liquid diet with some nausea between meals. Do not progress diet today. Pepcid changed to po. Add Florastor? Only 240cc noted po intake/24h.  Endo: no hx DM, A1c 5% - CBGs controlled <150. Will d/c CBGs  Lytes: Na 134, K 4.3  Renal: . Plain film cystogram once contrast clears to ensure no urine leak.  SrCr 0.74. UOP 2  Cards: HTN - BP elevated 145/91, HR 94, on IV hydralazine prn, resumed po ASA325 and metoprolol.  Hepatobil: EtOH hepatitis - LFTs / tbili WNL. Triglycerides have increased slightly from 140>>183 today. Will watch.  Neuro: anxiety / recent CVA . 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 Depressed  ID: Treated E.coli UTI + Flagyl for +C.diff, afebrile, WBC 7.8. PO Vanco  Resumed.  Best Practices: Lovenox 40, Pepcid  TPN Access:  PICC placed 09/09/13  TPN day#: 9 (3/6 >> )  Current Nutrition:  TPN at 66ml/hr = 1894 kcal and 99.6g protein  (90% kcal, 90.5% protein goals)  Nutritional Goals:  2100-2300 kCal, 110-120 grams of protein per day   Plan:  - Clinimix E 5/15 to 83 ml/hr and continue lipids to 10 ml/hr.   - Daily IV multivitamin, folic acid, and thiamine in TPN - Trace elements on MWF only d/t national shortage - D/c CBG checks - Nutrition labs + prealbumin + TG tomorrow.   Rishita Petron S. Alford Highland, PharmD, BCPS Clinical  Staff Pharmacist Pager [phone]  09/21/2013, 7:27 AM

## 2013-09-21 NOTE — Progress Notes (Signed)
  Subjective: Patient reports no significant changes the last 24 hours. He continues to tolerate clear liquids reasonably well. He has no new concerns or complaints today. Objective: Vital signs in last 24 hours: Temp:  [98 F (36.7 C)-99.1 F (37.3 C)] 98 F (36.7 C) (03/15 0510) Pulse Rate:  [89-98] 94 (03/15 0725) Resp:  [17-20] 20 (03/15 0510) BP: (141-148)/(88-94) 145/91 mmHg (03/15 0725) SpO2:  [97 %-98 %] 97 % (03/15 0510) Weight:  [165 lb 5.5 oz (75 kg)] 165 lb 5.5 oz (75 kg) (03/15 0510)  Intake/Output from previous day: 03/14 0701 - 03/15 0700 In: 2190 [P.O.:240; I.V.:1950] Out: 5537 [Urine:3525] Intake/Output this shift: Total I/O In: -  Out: 600 [Urine:600]  Physical Exam:  Constitutional: Vital signs reviewed. WD WN in NAD   Eyes: PERRL, No scleral icterus.   Cardiovascular: RRR Pulmonary/Chest: Normal effort Abdominal: Soft. Non-tender, minimal distention. Incisions all appear to be healing appropriately. Genitourinary: Foley draining well. Extremities: No cyanosis or edema   Lab Results:  Recent Labs  09/20/13 0520  HGB 8.9*  HCT 26.6*   BMET  Recent Labs  09/20/13 0520 09/21/13 0500  NA 135* 134*  K 4.4 4.3  CL 101 102  CO2 22 22  GLUCOSE 115* 119*  BUN 15 16  CREATININE 0.77 0.74  CALCIUM 8.6 8.6   No results found for this basename: LABPT, INR,  in the last 72 hours No results found for this basename: LABURIN,  in the last 72 hours Results for orders placed during the hospital encounter of 09/10/13  CLOSTRIDIUM DIFFICILE BY PCR     Status: Abnormal   Collection Time    09/11/13 12:09 AM      Result Value Ref Range Status   C difficile by pcr POSITIVE (*) NEGATIVE Final   Comment: CRITICAL RESULT CALLED TO, READ BACK BY AND VERIFIED WITH:     Coralyn Mark RN 09/11/13 0740 COSTELLO B    Studies/Results: No results found.  Assessment/Plan:   Resolving C. difficile colitis. Hopefully his diet will be able to be advanced. Foley  catheter probably out early next week.   LOS: 11 days   Zahava Quant S 09/21/2013, 12:24 PM

## 2013-09-21 NOTE — Progress Notes (Signed)
Patient ID: Tanner [name], male   DOB: [birth year], 58 y.o.   MRN: [id number] [facility] Progress Note:   * No surgery found *  Subjective: Not vocalizing any pain or discomfort Objective: Vital signs in last 24 hours: Temp:  [98 F (36.7 C)-99.1 F (37.3 C)] 98 F (36.7 C) (03/15 0510) Pulse Rate:  [89-98] 94 (03/15 0725) Resp:  [17-20] 20 (03/15 0510) BP: (141-148)/(88-94) 145/91 mmHg (03/15 0725) SpO2:  [97 %-98 %] 97 % (03/15 0510) Weight:  [165 lb 5.5 oz (75 kg)] 165 lb 5.5 oz (75 kg) (03/15 0510)  Intake/Output from previous day: 03/14 0701 - 03/15 0700 In: 2190 [P.O.:240; I.V.:1950] Out: 3525 [Urine:3525] Intake/Output this shift:    Physical Exam: Work of breathing is not labored.  Trocar sites from prostatectomy are healing without signs of infection.  Abdomen is soft and not distended.    Lab Results:  Results for orders placed during the hospital encounter of 09/10/13 (from the past 48 hour(s))  GLUCOSE, CAPILLARY     Status: Abnormal   Collection Time    09/19/13 11:13 AM      Result Value Ref Range   Glucose-Capillary 106 (*) 70 - 99 mg/dL   Comment 1 Notify RN    GLUCOSE, CAPILLARY     Status: Abnormal   Collection Time    09/19/13  7:42 PM      Result Value Ref Range   Glucose-Capillary 113 (*) 70 - 99 mg/dL   Comment 1 Documented in Chart     Comment 2 Notify RN    GLUCOSE, CAPILLARY     Status: Abnormal   Collection Time    09/20/13 12:43 AM      Result Value Ref Range   Glucose-Capillary 112 (*) 70 - 99 mg/dL   Comment 1 Documented in Chart     Comment 2 Notify RN    CBC     Status: Abnormal   Collection Time    09/20/13  5:20 AM      Result Value Ref Range   WBC 7.8  4.0 - 10.5 K/uL   RBC 2.77 (*) 4.22 - 5.81 MIL/uL   Hemoglobin 8.9 (*) 13.0 - 17.0 g/dL   HCT 26.6 (*) 39.0 - 52.0 %   MCV 96.0  78.0 - 100.0 fL   MCH 32.1  26.0 - 34.0 pg   MCHC 33.5  30.0 - 36.0 g/dL   RDW 13.1  11.5 - 15.5 %   Platelets 200  150 - 400 K/uL  BASIC  METABOLIC PANEL     Status: Abnormal   Collection Time    09/20/13  5:20 AM      Result Value Ref Range   Sodium 135 (*) 137 - 147 mEq/L   Potassium 4.4  3.7 - 5.3 mEq/L   Chloride 101  96 - 112 mEq/L   CO2 22  19 - 32 mEq/L   Glucose, Bld 115 (*) 70 - 99 mg/dL   BUN 15  6 - 23 mg/dL   Creatinine, Ser 0.77  0.50 - 1.35 mg/dL   Calcium 8.6  8.4 - 10.5 mg/dL   GFR calc non Af Amer >90  >90 mL/min   GFR calc Af Amer >90  >90 mL/min   Comment: (NOTE)     The eGFR has been calculated using the CKD EPI equation.     This calculation has not been validated in all clinical situations.     eGFR's persistently <90 mL/min  signify possible Chronic Kidney     Disease.  GLUCOSE, CAPILLARY     Status: Abnormal   Collection Time    09/20/13  5:59 AM      Result Value Ref Range   Glucose-Capillary 115 (*) 70 - 99 mg/dL   Comment 1 Documented in Chart     Comment 2 Notify RN    GLUCOSE, CAPILLARY     Status: Abnormal   Collection Time    09/20/13  4:31 PM      Result Value Ref Range   Glucose-Capillary 104 (*) 70 - 99 mg/dL  GLUCOSE, CAPILLARY     Status: Abnormal   Collection Time    09/21/13 12:14 AM      Result Value Ref Range   Glucose-Capillary 134 (*) 70 - 99 mg/dL  BASIC METABOLIC PANEL     Status: Abnormal   Collection Time    09/21/13  5:00 AM      Result Value Ref Range   Sodium 134 (*) 137 - 147 mEq/L   Potassium 4.3  3.7 - 5.3 mEq/L   Chloride 102  96 - 112 mEq/L   CO2 22  19 - 32 mEq/L   Glucose, Bld 119 (*) 70 - 99 mg/dL   BUN 16  6 - 23 mg/dL   Creatinine, Ser 0.74  0.50 - 1.35 mg/dL   Calcium 8.6  8.4 - 10.5 mg/dL   GFR calc non Af Amer >90  >90 mL/min   GFR calc Af Amer >90  >90 mL/min   Comment: (NOTE)     The eGFR has been calculated using the CKD EPI equation.     This calculation has not been validated in all clinical situations.     eGFR's persistently <90 mL/min signify possible Chronic Kidney     Disease.  GLUCOSE, CAPILLARY     Status: Abnormal    Collection Time    09/21/13  5:07 AM      Result Value Ref Range   Glucose-Capillary 121 (*) 70 - 99 mg/dL    Radiology/Results: No results found.  Anti-infectives: Anti-infectives   Start     Dose/Rate Route Frequency Ordered Stop   09/19/13 1200  vancomycin (VANCOCIN) 50 mg/mL oral solution 500 mg  Status:  Discontinued     500 mg Per Tube 4 times per day 09/19/13 1011 09/19/13 1105   09/19/13 1200  vancomycin (VANCOCIN) 50 mg/mL oral solution 250 mg     250 mg Oral 4 times per day 09/19/13 1105     09/11/13 2200  ceFAZolin (ANCEF) IVPB 1 g/50 mL premix  Status:  Discontinued     1 g 100 mL/hr over 30 Minutes Intravenous 3 times per day 09/11/13 1758 09/16/13 1658   09/11/13 1200  vancomycin (VANCOCIN) 50 mg/mL oral solution 500 mg  Status:  Discontinued     500 mg Per Tube 4 times per day 09/11/13 1159 09/17/13 1054   09/11/13 1000  metroNIDAZOLE (FLAGYL) IVPB 500 mg     500 mg 100 mL/hr over 60 Minutes Intravenous Every 8 hours 09/11/13 0932     09/10/13 2230  cefTRIAXone (ROCEPHIN) 1 g in dextrose 5 % 50 mL IVPB  Status:  Discontinued     1 g 100 mL/hr over 30 Minutes Intravenous Every 24 hours 09/10/13 2145 09/11/13 1758      Assessment/Plan: Problem List: Patient Active Problem List   Diagnosis Date Noted  . C. difficile colitis 09/11/2013  . UTI (urinary tract  infection) 09/11/2013  . Essential hypertension, benign 09/11/2013  . Ileus 09/10/2013  . Spastic hemiplegia affecting dominant side 09/09/2013  . Ileus, postoperative 09/09/2013  . Hypokalemia 09/09/2013  . Nonspecific (abnormal) findings on radiological and other examination of gastrointestinal tract 09/09/2013  . CVA (cerebral infarction) 09/05/2013  . 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 Malignant neoplasm of prostate 08/29/2013  . Prostate cancer 05/22/2013  . Hyponatremia 12/11/2012    Class: Chronic  . Ankle fracture 12/10/2012    C difficile resolving.  Appears nontoxic and no surgical  intervention needed.  CCS will sign off case for now.   * No surgery found *    LOS: 11 days   Matt B. Hassell Done, MD, [facility] [phone] beeper [phone]  09/21/2013 10:26 AM

## 2013-09-21 NOTE — Progress Notes (Signed)
PROGRESS NOTE    Tanner [name] EXB:[id number] DOB: 1956-05-28 DOA: 09/10/2013 PCP: Criselda Peaches, MD Primary Urologist: Dr. Rolan Bucco  HPI/Brief narrative 58 year old male with history of prostate cancer, hypertension, alcoholic hepatitis, status post radical robotic prostatectomy with bilateral pelvic lymph node dissection and lysis of colonic additions on 08/29/13, postop acute left corner radiata infarct with associated right hemiparesis on 09/02/13, transferred to inpatient rehabilitation on 09/05/13, developed nausea, vomiting, diarrhea and abdominal distention. GI was consulted and diagnosed postop ileus-narcotics were stopped, NG tube and rectal tube were placed, started on IV Reglan, found to be hypokalemic and urine cultures grew Escherichia coli 55,000 colonies. Due to worsening condition, he was transferred to the hospital on 09/10/13 for further management. He continues to have ileus and diarrhea-being managed conservatively and gradually starting to improve.  Assessment/Plan:  1. SBO:  multifactorial from adhesions, immobility, C. difficile, hypokalemia.        - CT abdomen suggested SBO with transition point.       - Improving       -NG out 3/12, c       -Remains on clears, IVF, supportive care       -still has rectal tube and liquid stool, improving        -GI & surgery following        -wean TNA when PO intake improves         -advance to full liq hopefully by tomorrow  2. Severe hypokalemia: Secondary to GI losses. Magnesium normal. Replaced aggressively.   3.    C. difficile colitis: Continue IV Flagyl and resumed PO vancomycin 3/13        -Dc rectal tube when improves all  3. Escherichia coli UTI versus colonization: Patient was on day 7 of IV antibiotics-changed from Rocephin to cefazolin then stopped-DC. Urology input appreciated     -Foley decision to be made by urology, s/p Radical prostatectomy     -needs foley till more ambulatory   4. Hypertension:  Fluctuating and uncontrolled.       -stable, changed metoprolol to PO  5. Anemia: Stable, CBC in am  6. Recent stroke with right hemiparesis:     -was unable to give ASA due to NG decompression and diarrhea with rectal tube    -resumed ASA        -Physical therapy  7. Recent radical robotic prostatectomy: Urology following  8. Incidental finding of right ICA aneurysm: Will need followup with repeat imaging in one year.  9. Anxiety and depression: Psychiatry input on 3/9 appreciated.      -ATivan PRN  Code Status: Full Family Communication: Discussed with patient's sister at bedside Disposition Plan: DC to CIR when medically stable   Consultants:  Gastroenterology  General surgery  Urology  Procedures:  Foley catheter  NG tube  Rectal tube  Antibiotics:  IV cefazolin 3/3 > 3/4  IV Rocephin 3/4 > 3/5  IV cefazolin 3/5 > 3/10  Subjective: Tolerating clears, some nausea and and tightness  Objective: Filed Vitals:   09/20/13 1446 09/20/13 2104 09/21/13 0510 09/21/13 0725  BP: 141/90 148/88 141/94 145/91  Pulse: 89 98 94 94  Temp: 98.3 F (36.8 C) 99.1 F (37.3 C) 98 F (36.7 C)   TempSrc: Oral Oral Oral   Resp: 18 17 20    Height:      Weight:   75 kg (165 lb 5.5 oz)   SpO2: 98% 98% 97%     Intake/Output Summary (Last 24  hours) at 09/21/13 1455 Last data filed at 09/21/13 1100  Gross per 24 hour  Intake   2190 ml  Output   3175 ml  Net   -985 ml   Filed Weights   09/19/13 0439 09/20/13 0544 09/21/13 0510  Weight: 77 kg (169 lb 12.1 oz) 76 kg (167 lb 8.8 oz) 75 kg (165 lb 5.5 oz)     Exam:  General exam: Lying comfortably in bed. Respiratory system: clear to auscultation. No increased work of breathing. Cardiovascular system: S1 & S2 heard, RRR. No JVD, murmurs, gallops, clicks or pedal edema.  Gastrointestinal system: Abdomen is non-distended, soft and nontender with few bowel sounds. Copious dark brown output via  rectal  tube-decreasing. Central nervous system: Alert and oriented. No cranial deficits. Extremities: 5 x 5 power in left limbs and 4 x 5 power in right limbs Psych: Flat affect   Data Reviewed: Basic Metabolic Panel:  Recent Labs Lab 09/15/13 0500  09/17/13 0500 09/18/13 0542 09/19/13 0941 09/20/13 0520 09/21/13 0500  NA 149*  < > 138 136* 135* 135* 134*  K 4.2  < > 3.8 4.0 4.9 4.4 4.3  CL 109  < > 99 99 100 101 102  CO2 30  < > 28 26 24 22 22   GLUCOSE 136*  < > 134* 120* 125* 115* 119*  BUN 13  < > 19 19 17 15 16   CREATININE 0.80  < > 0.82 0.86 0.77 0.77 0.74  CALCIUM 8.5  < > 8.5 8.2* 8.8 8.6 8.6  MG 2.1  --   --  1.8  --   --   --   PHOS 3.9  --   --  4.1  --   --   --   < > = values in this interval not displayed. Liver Function Tests:  Recent Labs Lab 09/15/13 0500 09/18/13 0542  AST 16 31  ALT 14 25  ALKPHOS 40 54  BILITOT <0.2* <0.2*  PROT 5.7* 5.8*  ALBUMIN 2.3* 2.3*   No results found for this basename: LIPASE, AMYLASE,  in the last 168 hours No results found for this basename: AMMONIA,  in the last 168 hours CBC:  Recent Labs Lab 09/15/13 0500 09/18/13 0542 09/20/13 0520  WBC 9.3 9.4 7.8  NEUTROABS 7.2  --   --   HGB 10.0* 9.2* 8.9*  HCT 31.1* 27.3* 26.6*  MCV 101.0* 97.8 96.0  PLT 247 199 200   Cardiac Enzymes: No results found for this basename: CKTOTAL, CKMB, CKMBINDEX, TROPONINI,  in the last 168 hours BNP (last 3 results) No results found for this basename: PROBNP,  in the last 8760 hours CBG:  Recent Labs Lab 09/20/13 0043 09/20/13 0559 09/20/13 1631 09/21/13 0014 09/21/13 0507  GLUCAP 112* 115* 104* 134* 121*    No results found for this or any previous visit (from the past 240 hour(s)).     Studies: No results found.      Scheduled Meds: . aspirin  325 mg Oral Daily  . enoxaparin (LOVENOX) injection  40 mg Subcutaneous QHS  . famotidine  20 mg Oral BID  . feeding supplement (RESOURCE BREEZE)  1 Container Oral TID BM   . FLUoxetine  20 mg Oral Daily  . metoprolol tartrate  25 mg Oral BID  . metronidazole  500 mg Intravenous Q8H  . vancomycin  250 mg Oral 4 times per day   Continuous Infusions: . sodium chloride 50 mL/hr at 09/21/13 0517  . Marland Kitchen  TPN (CLINIMIX-E) Adult 83 mL/hr at 09/20/13 1729   And  . fat emulsion 250 mL (09/20/13 1730)  . Marland KitchenTPN (CLINIMIX-E) Adult     And  . fat emulsion      Principal Problem:   Ileus Active Problems:   Malignant neoplasm of prostate   CVA (cerebral infarction)   Hypokalemia   C. difficile colitis   UTI (urinary tract infection)   Essential hypertension, benign    Time spent: 47 minutes    Domenic Polite, MD, [facility] Pager [phone]  If 7PM-7AM, please contact night-coverage [email or web address] Password [email or web address] 09/21/2013, 2:55 PM    LOS: 11 days

## 2013-09-22 LAB — CBC
HCT: 26.6 % — ABNORMAL LOW (ref 39.0–52.0)
Hemoglobin: 9 g/dL — ABNORMAL LOW (ref 13.0–17.0)
MCH: 32.6 pg (ref 26.0–34.0)
MCHC: 33.8 g/dL (ref 30.0–36.0)
MCV: 96.4 fL (ref 78.0–100.0)
PLATELETS: 192 10*3/uL (ref 150–400)
RBC: 2.76 MIL/uL — ABNORMAL LOW (ref 4.22–5.81)
RDW: 13.4 % (ref 11.5–15.5)
WBC: 6.4 10*3/uL (ref 4.0–10.5)

## 2013-09-22 LAB — DIFFERENTIAL
Basophils Absolute: 0 10*3/uL (ref 0.0–0.1)
Basophils Relative: 1 % (ref 0–1)
EOS ABS: 0.3 10*3/uL (ref 0.0–0.7)
EOS PCT: 4 % (ref 0–5)
Lymphocytes Relative: 12 % (ref 12–46)
Lymphs Abs: 0.8 10*3/uL (ref 0.7–4.0)
MONOS PCT: 14 % — AB (ref 3–12)
Monocytes Absolute: 0.9 10*3/uL (ref 0.1–1.0)
NEUTROS PCT: 70 % (ref 43–77)
Neutro Abs: 4.5 10*3/uL (ref 1.7–7.7)

## 2013-09-22 LAB — COMPREHENSIVE METABOLIC PANEL
ALK PHOS: 53 U/L (ref 39–117)
ALT: 13 U/L (ref 0–53)
AST: 12 U/L (ref 0–37)
Albumin: 2.5 g/dL — ABNORMAL LOW (ref 3.5–5.2)
BUN: 16 mg/dL (ref 6–23)
CO2: 24 mEq/L (ref 19–32)
Calcium: 8.8 mg/dL (ref 8.4–10.5)
Chloride: 100 mEq/L (ref 96–112)
Creatinine, Ser: 0.71 mg/dL (ref 0.50–1.35)
GFR calc Af Amer: >90 mL/min (ref >90)
GFR calc non Af Amer: >90 mL/min (ref >90)
Glucose, Bld: 115 mg/dL — ABNORMAL HIGH (ref 70–99)
POTASSIUM: 4.2 meq/L (ref 3.7–5.3)
SODIUM: 135 meq/L — AB (ref 137–147)
TOTAL PROTEIN: 5.9 g/dL — AB (ref 6.0–8.3)
Total Bilirubin: <0.2 mg/dL — ABNORMAL LOW (ref 0.3–1.2)

## 2013-09-22 LAB — PREALBUMIN: PREALBUMIN: 26.3 mg/dL (ref 17.0–34.0)

## 2013-09-22 LAB — PHOSPHORUS: Phosphorus: 4.6 mg/dL (ref 2.3–4.6)

## 2013-09-22 LAB — TRIGLYCERIDES: TRIGLYCERIDES: 151 mg/dL — AB (ref <150)

## 2013-09-22 LAB — MAGNESIUM: Magnesium: 1.8 mg/dL (ref 1.5–2.5)

## 2013-09-22 MED ORDER — FAT EMULSION 20 % IV EMUL
240.0000 mL | INTRAVENOUS | Status: AC
Start: 2013-09-22 — End: 2013-09-23
  Administered 2013-09-22: 240 mL via INTRAVENOUS
  Filled 2013-09-22: qty 250

## 2013-09-22 MED ORDER — TRACE MINERALS CR-CU-F-FE-I-MN-MO-SE-ZN IV SOLN
INTRAVENOUS | Status: AC
  Administered 2013-09-22: 18:00:00 via INTRAVENOUS
  Filled 2013-09-22: qty 2000

## 2013-09-22 MED ORDER — TRAZODONE HCL 50 MG PO TABS
50.0000 mg | ORAL_TABLET | Freq: Every evening | ORAL | Status: DC | PRN
  Administered 2013-09-22 – 2013-09-29 (×7): 50 mg via ORAL
  Filled 2013-09-22 (×7): qty 1

## 2013-09-22 MED ORDER — NITROFURANTOIN MONOHYD MACRO 100 MG PO CAPS
100.0000 mg | ORAL_CAPSULE | Freq: Two times a day (BID) | ORAL | Status: AC
  Administered 2013-09-22 – 2013-09-24 (×6): 100 mg via ORAL
  Filled 2013-09-22 (×6): qty 1

## 2013-09-22 NOTE — Progress Notes (Signed)
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 Now transfered back to acute for ileus vs SBO.    PT Comments    Pt limited today by nausea and dizziness. ? First time up since his NG tube removed and began po diet. Reports he did not get up and walk at all Sat or Sun (did not ask nursing to assist him). Reports he cannot tolerate sitting in the chair due to rectal tube and "raw skin" around the tube. Encouraged continuing bed level exercises while pt remains in bed so many hours of the day. Encouraged him that nursing can manage his tubes and assist him up in the room as well.    Follow Up Recommendations  CIR     Equipment Recommendations   (TBA)    Recommendations for Other Services      Precautions / Restrictions Precautions Precautions: Fall Precaution Comments: multiple lines/tubes.    Mobility  Bed Mobility Overal bed mobility: Needs Assistance Bed Mobility: Supine to Sit;Sit to Sidelying Rolling: Supervision   Supine to sit: Min guard   Sit to sidelying: Min assist General bed mobility comments: supervision for multiple lines/tubes and due to air mattress (remained inflated as pt with incr pain from rectal tube)  Transfers Overall transfer level: Needs assistance Equipment used: Rolling walker (2 wheeled) Transfers: Sit to/from Stand Sit to Stand: Supervision         General transfer comment: vc for safe use of RW (pt initially trying to pull up on RW); x2  Ambulation/Gait Ambulation/Gait assistance: Min guard Ambulation Distance (Feet): 25 Feet Assistive device: Rolling walker (2 wheeled) Gait Pattern/deviations: Step-through pattern;Decreased stride length Gait velocity: decreased   General Gait  Details: limited to in the room due to rectal tube leaking (+c diff); pt also became nauseous after just 25 ft and had to sit to rest    Stairs            Wheelchair Mobility    Modified Rankin (Stroke Patients Only) Modified Rankin (Stroke Patients Only) Pre-Morbid Rankin Score: No symptoms Modified Rankin: Moderately severe disability     Balance     Sitting balance-Leahy Scale: Fair       Standing balance-Leahy Scale: Fair                      Cognition Arousal/Alertness: Awake/alert Behavior During Therapy: WFL for tasks assessed/performed Overall Cognitive Status: Within Functional Limits for tasks assessed                      Exercises General Exercises - Lower Extremity Ankle Circles/Pumps: AROM;Both;10 reps;Supine (educated to do 10-20 every hour ) Quad Sets: AROM;Both;10 reps;Supine Heel Slides: AROM;Both;10 reps;Supine Low Level/ICU Exercises Stabilized Bridging: AROM;Right;Left;Both;10 reps;Supine (1/2 bridging x 10 ea leg; full bridging x 5)  General Comments        Pertinent Vitals/Pain Pain at rectal tube site; RN notified and in to inspect tube and skin    Home Living                      Prior Function            PT Goals (current goals can now be found in the care plan section)  Acute Rehab PT Goals Patient Stated Goal: recover and get back to being independent PT Goal Formulation: With patient/family Time For Goal Achievement: 09/25/13 Potential to Achieve Goals: Good Progress towards PT goals: Progressing toward goals    Frequency  Min 4X/week    PT Plan Current plan remains appropriate    End of Session   Activity Tolerance: Treatment limited secondary to medical complications (Comment) (nausea) Patient left: in bed;with call bell/phone within reach     Time: 0942-1022 PT Time Calculation (min): 40 min  Charges:  $Gait Training: 8-22 mins $Therapeutic Exercise: 8-22 mins $Therapeutic  Activity: 8-22 mins                    G Codes:      Tanner [name] 09/24/13, 10:32 AM Pager [phone]

## 2013-09-22 NOTE — Progress Notes (Signed)
PARENTERAL NUTRITION CONSULT NOTE - FOLLOW UP  Pharmacy Consult:  TPN  Indication:  Prolonged ileus vs SBO  Allergies  Allergen Reactions  . Dilaudid [Hydromorphone Hcl] Nausea And Vomiting    Pre pt history  . Oxycodone Nausea And Vomiting  . Procaine Hcl Other (See Comments)    Patient states he is allergic to novicaine  . Zofran [Ondansetron] Nausea And Vomiting  . Sulfa Antibiotics Rash    Patient Measurements: Height: 5\' 10"  (177.8 cm) Weight: 165 lb 5.5 oz (75 kg) IBW/kg (Calculated) : 73  Vital Signs: Temp: 98.1 F (36.7 C) (03/16 0453) Temp src: Oral (03/16 0453) BP: 137/89 mmHg (03/16 0453) Pulse Rate: 91 (03/16 0453) Intake/Output from previous day: 03/15 0701 - 03/16 0700 In: 2070.8 [P.O.:960; I.V.:1110.8] Out: 3250 [Urine:3250]  Labs:  Recent Labs  09/20/13 0520 09/22/13 0500  WBC 7.8 6.4  HGB 8.9* 9.0*  HCT 26.6* 26.6*  PLT 200 192     Recent Labs  09/20/13 0520 09/21/13 0500 09/22/13 0500  NA 135* 134* 135*  K 4.4 4.3 4.2  CL 101 102 100  CO2 22 22 24   GLUCOSE 115* 119* 115*  BUN 15 16 16   CREATININE 0.77 0.74 0.71  CALCIUM 8.6 8.6 8.8  MG  --   --  1.8  PHOS  --   --  4.6  PROT  --   --  5.9*  ALBUMIN  --   --  2.5*  AST  --   --  12  ALT  --   --  13  ALKPHOS  --   --  53  BILITOT  --   --  <0.2*  TRIG  --   --  151*   Estimated Creatinine Clearance: 107.7 ml/min (by C-G formula based on Cr of 0.71).    Recent Labs  09/20/13 1631 09/21/13 0014 09/21/13 0507  GLUCAP 104* 134* 121*   Insulin Requirements in the past 24 hours:  SSI d/c'd 3/10 CBGs 115-121  Current Nutrition:  Clinimix E 5/15 at 71ml/hr + lipid 20% emulsion at 47mL/hr = 1894 kcal and 99.6g protein Clear liquid diet yesterday- 100% of meals consumed; advanced to full liquids today Resource Breeze TID (9g protein and 250kcal per serving)  Nutritional Goals:  2100-2300 kCal, 110-120 grams of protein per day per RD recommendations 3/13  Assessment: 64  YOM admitted on 08/29/13 for work-up of prostate cancer and underwent radical robotic prostatectomy.  He was diagnosed with a new CVA post-op as well as right ICA aneurysm.  Discharged to Rehab on 09/06/13 and was later found to have an ileus requiring NGT for decompression.  Returned to the acute care services on 09/10/13 for further management.  Pharmacy consulted to manage TPN due to prolonged ileus vs SBO.  GI: hx GERD, +C.diff colitis. Prealbumin low at 12.2.  KUB: Xray 3/12 show marked improvement; if worsens, plan SBFT. NG tube out, but flexiseal remains. Advancing diet today- 100% of meals consumed yesterday per charting. Also drinking Resource Breeze  Endo: no hx DM, A1c 5% - CBGs controlled <150, off SSI  Lytes: Na 135, K 4.2, Phos 4.6, Mag 1.8  Renal: SCr 0.71, CrCl >163mL/min, UOP 64mL/kg/hr. Still with foley- hopefully to be removed in next few days  Cards: HTN - VSS on IV hydralazine prn, resumed po ASA325 and metoprolol.  Hepatobil: EtOH hepatitis - LFTs WNL except low albumin (2.5) and TBili (<0.2). Triglycerides 151  Neuro: anxiety / recent CVA- now resumed on ASA. 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 Depression on fluoxetine  ID: PO vancomycin and Flagyl for + CDiff. WBC 6.4, afebrile  Best Practices: Lovenox 40, Pepcid PO  TPN Access:  PICC placed 09/09/13  TPN day#: 10 (3/6 >> )  Plan:  1. Will decrease Clinimix E 5/15 to 60 ml/hr + continue lipid 20 % emulsion at 10 ml/hr- this will provide 72g protein and 1584 kcal daily- hoping this decrease will encourage more PO intake 2. Daily IV multivitamin, folic acid 1mg , and thiamine 100mg  in TPN 3. Trace elements on MWF only d/t national shortage 4. Continue 1/2NS at 7mL/hr per MD 5. BMET, Mag and Phos tomorrow morning  Zaid Tomes D. Keyondra Lagrand, PharmD, BCPS Clinical Pharmacist Pager: [phone] 09/22/2013 10:09 AM

## 2013-09-22 NOTE — Progress Notes (Signed)
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 Now transfered back to acute for ileus vs SBO.   OT comments  Pt. Very motivated to participate with OT to increase I and safety with ADLs and moblity. Pt. Will need continued OT to increase strength and function in R UE. Pt. Requires encouragement to use R UE for ADL tasks.   Follow Up Recommendations       Barriers to Discharge       Equipment Recommendations       Recommendations for Other Services    Frequency     Progress towards OT Goals Progress towards OT goals: Progressing toward goals  Plan      Precautions / Restrictions Precautions Precautions: Fall Precaution Comments: multiple lines/tubes. Restrictions Weight Bearing Restrictions: No   Pertinent Vitals/Pain 5/10 in neck and back    ADL  Grooming: Performed;Wash/dry hands;Wash/dry face;Brushing hair;Set up Where Assessed - Grooming: Unsupported sitting Upper Body Bathing: Performed;Set up Where Assessed - Upper Body Bathing: Unsupported sitting Lower Body Bathing: Performed;Minimal assistance Lower Body Bathing: Patient Percentage: 80% Where Assessed - Lower Body Bathing: Unsupported sitting;Supported standing Upper Body Dressing: Set up Where Assessed - Upper Body Dressing: Unsupported sitting Lower Body Dressing: Performed;Moderate assistance Lower Body Dressing: Patient Percentage: 50% Where Assessed - Lower Body Dressing: Unsupported sitting;Supported standing ADL Comments: Pt. requires walker to steady in standing.     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 Now transfered back to acute for ileus vs SBO.    Subjective Data      Prior Functioning       Cognition  Cognition Arousal/Alertness: Awake/alert Behavior During Therapy: WFL for tasks assessed/performed Overall Cognitive Status: Within Functional Limits for tasks assessed    Mobility  Bed Mobility Overal bed mobility: Needs Assistance Bed Mobility: Supine to Sit;Sit to Sidelying Rolling: Supervision Supine to sit: Min guard Sit to sidelying: Min assist General bed mobility comments: supervision for multiple lines/tubes and due to air mattress (remained inflated as pt with incr pain from rectal tube) Transfers Overall transfer level: Needs assistance Equipment used: Rolling walker (2 wheeled) Transfers: Sit to/from Stand Sit to Stand: Supervision General transfer comment: Pt. is Min A with sit to stand from bed for peri hygeine.    Exercises  General Exercises - Lower Extremity Ankle Circles/Pumps: AROM;Both;10 reps;Supine (educated to do 10-20 every hour ) Quad Sets: AROM;Both;10 reps;Supine Heel Slides: AROM;Both;10 reps;Supine Low Level/ICU Exercises Stabilized Bridging: AROM;Right;Left;Both;10 reps;Supine (1/2 bridging x 10 ea leg; full bridging x 5)   Balance Balance Sitting balance-Leahy Scale: Fair Standing balance-Leahy Scale: Fair  End of Session OT - End of Session Equipment Utilized During Treatment: Rolling walker Activity Tolerance: Patient tolerated treatment well Patient left: in bed;with call bell/phone within reach;with family/visitor  present  [place] 09/22/2013, 12:49 PM

## 2013-09-22 NOTE — Progress Notes (Signed)
PROGRESS NOTE    Tanner [name] LFY:[id number] DOB: 1956/03/09 DOA: 09/10/2013 PCP: Criselda Peaches, MD Primary Urologist: Dr. Rolan Bucco  HPI/Brief narrative 58 year old male with history of prostate cancer, hypertension, alcoholic hepatitis, status post radical robotic prostatectomy with bilateral pelvic lymph node dissection and lysis of colonic additions on 08/29/13, postop acute left corner radiata infarct with associated right hemiparesis on 09/02/13, transferred to inpatient rehabilitation on 09/05/13, developed nausea, vomiting, diarrhea and abdominal distention. GI was consulted and diagnosed postop ileus-narcotics were stopped, NG tube and rectal tube were placed, started on IV Reglan, found to be hypokalemic and urine cultures grew Escherichia coli 55,000 colonies. Due to worsening condition, he was transferred to the hospital on 09/10/13 for further management. He continues to have ileus and diarrhea-being managed conservatively and gradually starting to improve.  Assessment/Plan:  1. SBO:  multifactorial from adhesions, immobility, C. difficile, hypokalemia.        - CT abdomen suggested SBO with transition point.       - Improving       -NG out 3/12,        -IVF, supportive care       -still has rectal tube and liquid stool, improving        -GI & surgery was following, singed off 3/15        -wean TNA when PO intake improves, hopefully tomorrow        -advance  full liq diet today  2. Severe hypokalemia: Secondary to GI losses. Magnesium normal. Replaced aggressively.   3.    C. difficile colitis: Continue IV Flagyl and resumed PO vancomycin 3/13        -Dc rectal tube when improves all  3. Escherichia coli UTI versus colonization: Patient was on day 7 of IV antibiotics-changed from Rocephin to cefazolin then stopped-DC. Urology input appreciated     -Foley decision to be made by urology, s/p Radical prostatectomy    4. Hypertension: Fluctuating and uncontrolled.   -stable, changed metoprolol to PO  5. Anemia: Stable, CBC in am  6. Recent stroke with right hemiparesis:     -was unable to give ASA due to NG decompression and diarrhea with rectal tube    -resumed ASA , now statin    -Physical therapy  7. Recent radical robotic prostatectomy: Urology following  8. Incidental finding of right ICA aneurysm: Will need followup with repeat imaging in one year.  9. Anxiety and depression: Psychiatry input on 3/9 appreciated.      -ATivan PRN  Code Status: Full Family Communication: Discussed with patient's sister at bedside Disposition Plan: DC to CIR when medically stable   Consultants:  Gastroenterology  General surgery  Urology  Procedures:  Foley catheter  NG tube  Rectal tube  Antibiotics:  IV cefazolin 3/3 > 3/4  IV Rocephin 3/4 > 3/5  IV cefazolin 3/5 > 3/10  Subjective: Tolerating clears, some nausea and and tightness  Objective: Filed Vitals:   09/21/13 0725 09/21/13 1519 09/21/13 2209 09/22/13 0453  BP: 145/91 151/85 137/81 137/89  Pulse: 94 83 92 91  Temp:  98.2 F (36.8 C) 98.4 F (36.9 C) 98.1 F (36.7 C)  TempSrc:  Oral Oral Oral  Resp:  18 21 17   Height:      Weight:      SpO2:  99% 99% 98%    Intake/Output Summary (Last 24 hours) at 09/22/13 1322 Last data filed at 09/22/13 0413  Gross per 24 hour  Intake 1710.83  ml  Output   2350 ml  Net -639.17 ml   Filed Weights   09/19/13 0439 09/20/13 0544 09/21/13 0510  Weight: 77 kg (169 lb 12.1 oz) 76 kg (167 lb 8.8 oz) 75 kg (165 lb 5.5 oz)     Exam:  General exam: Lying comfortably in bed. Respiratory system: clear to auscultation. No increased work of breathing. Cardiovascular system: S1 & S2 heard, RRR. No JVD, murmurs, gallops, clicks or pedal edema.  Gastrointestinal system: Abdomen is non-distended, soft and nontender with few bowel sounds. Copious dark brown output via  rectal tube-decreasing. Central nervous system: Alert and oriented.  No cranial deficits. Extremities: 5 x 5 power in left limbs and 4 x 5 power in right limbs Psych: Flat affect   Data Reviewed: Basic Metabolic Panel:  Recent Labs Lab 09/18/13 0542 09/19/13 0941 09/20/13 0520 09/21/13 0500 09/22/13 0500  NA 136* 135* 135* 134* 135*  K 4.0 4.9 4.4 4.3 4.2  CL 99 100 101 102 100  CO2 26 24 22 22 24   GLUCOSE 120* 125* 115* 119* 115*  BUN 19 17 15 16 16   CREATININE 0.86 0.77 0.77 0.74 0.71  CALCIUM 8.2* 8.8 8.6 8.6 8.8  MG 1.8  --   --   --  1.8  PHOS 4.1  --   --   --  4.6   Liver Function Tests:  Recent Labs Lab 09/18/13 0542 09/22/13 0500  AST 31 12  ALT 25 13  ALKPHOS 54 53  BILITOT <0.2* <0.2*  PROT 5.8* 5.9*  ALBUMIN 2.3* 2.5*   No results found for this basename: LIPASE, AMYLASE,  in the last 168 hours No results found for this basename: AMMONIA,  in the last 168 hours CBC:  Recent Labs Lab 09/18/13 0542 09/20/13 0520 09/22/13 0500  WBC 9.4 7.8 6.4  NEUTROABS  --   --  4.5  HGB 9.2* 8.9* 9.0*  HCT 27.3* 26.6* 26.6*  MCV 97.8 96.0 96.4  PLT 199 200 192   Cardiac Enzymes: No results found for this basename: CKTOTAL, CKMB, CKMBINDEX, TROPONINI,  in the last 168 hours BNP (last 3 results) No results found for this basename: PROBNP,  in the last 8760 hours CBG:  Recent Labs Lab 09/20/13 0043 09/20/13 0559 09/20/13 1631 09/21/13 0014 09/21/13 0507  GLUCAP 112* 115* 104* 134* 121*    No results found for this or any previous visit (from the past 240 hour(s)).     Studies: No results found.      Scheduled Meds: . aspirin  325 mg Oral Daily  . enoxaparin (LOVENOX) injection  40 mg Subcutaneous QHS  . famotidine  20 mg Oral BID  . feeding supplement (RESOURCE BREEZE)  1 Container Oral TID BM  . FLUoxetine  20 mg Oral Daily  . metoprolol tartrate  25 mg Oral BID  . metronidazole  500 mg Intravenous Q8H  . nitrofurantoin (macrocrystal-monohydrate)  100 mg Oral BID  . vancomycin  250 mg Oral 4 times per  day   Continuous Infusions: . sodium chloride 50 mL/hr at 09/22/13 0413  . Marland KitchenTPN (CLINIMIX-E) Adult     And  . fat emulsion    . Marland KitchenTPN (CLINIMIX-E) Adult 83 mL/hr at 09/21/13 1806   And  . fat emulsion 250 mL (09/21/13 1805)    Principal Problem:   Ileus Active Problems:   Malignant neoplasm of prostate   CVA (cerebral infarction)   Hypokalemia   C. difficile colitis   UTI (urinary  tract infection)   Essential hypertension, benign    Time spent: 25 minutes    Domenic Polite, MD, [facility] Pager [phone]  If 7PM-7AM, please contact night-coverage [email or web address] Password [email or web address] 09/22/2013, 1:22 PM    LOS: 12 days

## 2013-09-22 NOTE — Progress Notes (Signed)
Patient is doing very well with therapy. Medical issues limiting his progression at this time. I continue to follow.  [phone]

## 2013-09-22 NOTE — Progress Notes (Signed)
Urology Progress Note  Subjective:     No acute urologic events overnight. Did not require NG tube replaced over weekend. Tolerating full liquid diet. Minimal nausea. Still has flexiseal.    Up and ambulating more.   ROS: Positive: RUE/RLE weakness.  Negative: Chest pain.  Objective:  Patient Vitals for the past 24 hrs:  BP Temp Temp src Pulse Resp SpO2  09/22/13 0453 137/89 mmHg 98.1 F (36.7 C) Oral 91 17 98 %  09/21/13 2209 137/81 mmHg 98.4 F (36.9 C) Oral 92 21 99 %  09/21/13 1519 151/85 mmHg 98.2 F (36.8 C) Oral 83 18 99 %    Physical Exam: General:  No acute distress, awake Cardiovascular:    [x]   S1/S2 present, Mildly tachycardic.  []   Irregularly irregular Chest:  CTA-B Abdomen:               []  Soft, appropriately TTP  [x]  Soft, Distended. No rebound TTP. Incisions c/d/i without erythema.  []  Soft, appropriately TTP, incision(s) clean/dry/intact  Genitourinary: Foley in place. Clear urine. Negative edema.     I/O last 3 completed shifts: In: 4020.8 [P.O.:960; I.V.:3060.8] Out: 5825 [Urine:5825]  Recent Labs     09/20/13  0520  09/22/13  0500  HGB  8.9*  9.0*  WBC  7.8  6.4  PLT  200  192    Recent Labs     09/21/13  0500  09/22/13  0500  NA  134*  135*  K  4.3  4.2  CL  102  100  CO2  22  24  BUN  16  16  CREATININE  0.74  0.71  CALCIUM  8.6  8.8  GFRNONAA  >90  >90  GFRAA  >90  >90     No results found for this basename: PT, INR, APTT,  in the last 72 hours   No components found with this basename: ABG,     Length of stay: 12 days.  Assessment: UTI. C. Diff colitis.  Robotic radical prostatectomy 08/29/13.   Plan: -Continue conservative management.  -Will plan to d/c foley tomorrow. Will need condom catheter placed.  -Will start nitrofurantoin 100 mg BID today for 3 days in preparation for cath removal. Have discussed w/ pharmacy and primary team. This is least likely to cause problems w/ C. Diff.     Rolan Bucco, MD [phone]

## 2013-09-23 LAB — BASIC METABOLIC PANEL
BUN: 17 mg/dL (ref 6–23)
CHLORIDE: 103 meq/L (ref 96–112)
CO2: 25 mEq/L (ref 19–32)
Calcium: 9 mg/dL (ref 8.4–10.5)
Creatinine, Ser: 0.75 mg/dL (ref 0.50–1.35)
GFR calc non Af Amer: >90 mL/min (ref >90)
Glucose, Bld: 106 mg/dL — ABNORMAL HIGH (ref 70–99)
POTASSIUM: 4.1 meq/L (ref 3.7–5.3)
Sodium: 138 mEq/L (ref 137–147)

## 2013-09-23 LAB — MAGNESIUM: Magnesium: 1.8 mg/dL (ref 1.5–2.5)

## 2013-09-23 LAB — PHOSPHORUS: PHOSPHORUS: 4.5 mg/dL (ref 2.3–4.6)

## 2013-09-23 MED ORDER — SIMVASTATIN 20 MG PO TABS
20.0000 mg | ORAL_TABLET | Freq: Every day | ORAL | Status: DC
  Administered 2013-09-23 – 2013-09-29 (×7): 20 mg via ORAL
  Filled 2013-09-23 (×8): qty 1

## 2013-09-23 MED ORDER — THIAMINE HCL 100 MG/ML IJ SOLN
INTRAVENOUS | Status: AC
  Administered 2013-09-23: 18:00:00 via INTRAVENOUS
  Filled 2013-09-23: qty 1000

## 2013-09-23 MED ORDER — FAT EMULSION 20 % IV EMUL
240.0000 mL | INTRAVENOUS | Status: AC
  Administered 2013-09-23: 240 mL via INTRAVENOUS
  Filled 2013-09-23: qty 250

## 2013-09-23 NOTE — Progress Notes (Signed)
Foley D/c'ed per MD order and protocol. Condom cath applied as ordered. Pt tolerated procedure well. Will continue to monitor.

## 2013-09-23 NOTE — Progress Notes (Addendum)
PROGRESS NOTE    Tanner [name] UVO:[id number] DOB: 1956/03/15 DOA: 09/10/2013 PCP: Criselda Peaches, MD Primary Urologist: Dr. Rolan Bucco  HPI/Brief narrative 58 year old male with history of prostate cancer, hypertension, alcoholic hepatitis, status post radical robotic prostatectomy with bilateral pelvic lymph node dissection and lysis of colonic additions on 08/29/13, postop acute left corona radiata infarct with associated right hemiparesis on 09/02/13, transferred to inpatient rehabilitation on 09/05/13, developed nausea, vomiting, diarrhea and abdominal distention. GI was consulted and diagnosed postop ileus and Cdiff colitis, narcotics were stopped, NG tube and rectal tube were placed, started on IV Reglan, found to be hypokalemic and urine cultures grew Escherichia coli 55,000 colonies. Due to worsening condition, he was transferred to the hospital on 09/10/13 for further management. He continues to have ileus and diarrhea-being managed conservatively and gradually starting to improve. Diet slowly being advanced, ileus very slow to improve, Urology following and foley removed today  Assessment/Plan:  1. SBO:  multifactorial from adhesions, immobility, C. difficile, hypokalemia.        - CT abdomen suggested SBO with transition point.       - Improving very slowly       -NG out 3/12,        -IVF, supportive care       -still has rectal tube and liquid stool, improving        GI & surgery was following, signed off 3/15       -wean TNA        -keep on full liq diet today, hopefully advance to bland diet by tomorrow  2. Severe hypokalemia: Secondary to GI losses. Magnesium normal. Replaced aggressively.   3.    C. difficile colitis: Continue IV Flagyl and resumed PO vancomycin 3/13        -Dc rectal tube when stool more formed, hopefully 1-2days  3. Escherichia coli UTI versus colonization: Patient was on day 7 of IV antibiotics-changed from Rocephin to cefazolin then stopped-DC.  Urology input appreciated     - Foley Dced by Urology 3/17 today  and nitrofurantoin for 2days,      - s/p Radical prostatectomy    4. Hypertension: Fluctuating and uncontrolled.       -stable, changed metoprolol to PO  5. Anemia: Stable, CBC in am  6. Recent stroke with right hemiparesis:     -was unable to give ASA due to NG decompression and diarrhea with rectal tube    -resumed ASA , now statin    -Physical therapy  7. Recent radical robotic prostatectomy: Urology following  8. Incidental finding of right ICA aneurysm: Will need followup with repeat imaging in one year.  9. Anxiety and depression: Psychiatry input on 3/9 appreciated.      -ATivan PRN  Code Status: Full Family Communication: Discussed with patient's sister at bedside Disposition Plan: DC to CIR, hopefully later this week  Consultants:  Gastroenterology  General surgery  Urology  Procedures:  Foley catheter  NG tube  Rectal tube  Antibiotics:  IV cefazolin 3/3 > 3/4  IV Rocephin 3/4 > 3/5  IV cefazolin 3/5 > 3/10  Subjective: Tolerating full liquids, some nausea, no vomiting, requests diet not to be advanced  Objective: Filed Vitals:   09/21/13 2209 09/22/13 0453 09/22/13 1500 09/23/13 0533  BP: 137/81 137/89 129/89 151/100  Pulse: 92 91 84 93  Temp: 98.4 F (36.9 C) 98.1 F (36.7 C) 98.4 F (36.9 C) 98.4 F (36.9 C)  TempSrc: Oral Oral Oral Oral  Resp: 21 17 20 18   Height:      Weight:    77 kg (169 lb 12.1 oz)  SpO2: 99% 98% 96% 96%    Intake/Output Summary (Last 24 hours) at 09/23/13 1219 Last data filed at 09/23/13 0730  Gross per 24 hour  Intake    720 ml  Output   3100 ml  Net  -2380 ml   Filed Weights   09/20/13 0544 09/21/13 0510 09/23/13 0533  Weight: 76 kg (167 lb 8.8 oz) 75 kg (165 lb 5.5 oz) 77 kg (169 lb 12.1 oz)     Exam:  General exam: Lying comfortably in bed. Respiratory system: clear to auscultation. No increased work of  breathing. Cardiovascular system: S1 & S2 heard, RRR. No JVD, murmurs, gallops, clicks or pedal edema.  Gastrointestinal system: Abdomen is  mildly distended, soft and nontender with few bowel sounds. Copious dark brown output via  rectal tube-decreasing. Central nervous system: Alert and oriented. No cranial deficits. Extremities: 5 x 5 power in left limbs and 4 x 5 power in right limbs Psych: Flat affect   Data Reviewed: Basic Metabolic Panel:  Recent Labs Lab 09/18/13 0542 09/19/13 0941 09/20/13 0520 09/21/13 0500 09/22/13 0500 09/23/13 0525  NA 136* 135* 135* 134* 135* 138  K 4.0 4.9 4.4 4.3 4.2 4.1  CL 99 100 101 102 100 103  CO2 26 24 22 22 24 25   GLUCOSE 120* 125* 115* 119* 115* 106*  BUN 19 17 15 16 16 17   CREATININE 0.86 0.77 0.77 0.74 0.71 0.75  CALCIUM 8.2* 8.8 8.6 8.6 8.8 9.0  MG 1.8  --   --   --  1.8 1.8  PHOS 4.1  --   --   --  4.6 4.5   Liver Function Tests:  Recent Labs Lab 09/18/13 0542 09/22/13 0500  AST 31 12  ALT 25 13  ALKPHOS 54 53  BILITOT <0.2* <0.2*  PROT 5.8* 5.9*  ALBUMIN 2.3* 2.5*   No results found for this basename: LIPASE, AMYLASE,  in the last 168 hours No results found for this basename: AMMONIA,  in the last 168 hours CBC:  Recent Labs Lab 09/18/13 0542 09/20/13 0520 09/22/13 0500  WBC 9.4 7.8 6.4  NEUTROABS  --   --  4.5  HGB 9.2* 8.9* 9.0*  HCT 27.3* 26.6* 26.6*  MCV 97.8 96.0 96.4  PLT 199 200 192   Cardiac Enzymes: No results found for this basename: CKTOTAL, CKMB, CKMBINDEX, TROPONINI,  in the last 168 hours BNP (last 3 results) No results found for this basename: PROBNP,  in the last 8760 hours CBG:  Recent Labs Lab 09/20/13 0043 09/20/13 0559 09/20/13 1631 09/21/13 0014 09/21/13 0507  GLUCAP 112* 115* 104* 134* 121*    No results found for this or any previous visit (from the past 240 hour(s)).     Studies: No results found.      Scheduled Meds: . aspirin  325 mg Oral Daily  . enoxaparin  (LOVENOX) injection  40 mg Subcutaneous QHS  . famotidine  20 mg Oral BID  . feeding supplement (RESOURCE BREEZE)  1 Container Oral TID BM  . FLUoxetine  20 mg Oral Daily  . metoprolol tartrate  25 mg Oral BID  . metronidazole  500 mg Intravenous Q8H  . nitrofurantoin (macrocrystal-monohydrate)  100 mg Oral BID  . simvastatin  20 mg Oral q1800  . vancomycin  250 mg Oral 4 times per day   Continuous  Infusions: . sodium chloride 50 mL/hr at 09/22/13 0413  . Marland KitchenTPN (CLINIMIX-E) Adult 60 mL/hr at 09/22/13 1739   And  . fat emulsion 240 mL (09/22/13 1739)  . Marland KitchenTPN (CLINIMIX-E) Adult     And  . fat emulsion      Principal Problem:   Ileus Active Problems:   Malignant neoplasm of prostate   CVA (cerebral infarction)   Hypokalemia   C. difficile colitis   UTI (urinary tract infection)   Essential hypertension, benign    Time spent: 39 minutes    Domenic Polite, MD, [facility] Pager [phone]  If 7PM-7AM, please contact night-coverage [email or web address] Password [email or web address] 09/23/2013, 12:19 PM    LOS: 13 days

## 2013-09-23 NOTE — Progress Notes (Signed)
NUTRITION FOLLOW UP  DOCUMENTATION CODES Per approved criteria  -Severe malnutrition in the context of acute illness or injury   Intervention:  TPN per pharmacy Continue Resource Breeze po TID, each supplement provides 250 kcal and 9 grams of protein RD to follow for nutrition care plan  Nutrition Dx: Inadequate oral intake related to altered GI function as evidenced by PO intake 50%, ongoing  Goal: Pt to meet >/= 90% of their estimated nutrition needs, met  Monitor:  TPN prescription, PO intake, weight, labs, I/O's  ASSESSMENT: 58 y.o. male with PMH of prostate cancer and hypertension who has had recent prostatectomy following which patient had developed right-sided hemiplegia and was found to have acute CVA and was eventually transferred to rehabilitation where patient started developing postoperative ileus. Gastroenterologist was consulted and patient's narcotics were stopped and patient was placed on NG tube and rectal tube.   Patient also was started on IV Reglan and was also found to have hypokalemia. Patient also was found to be febrile and urine cultures Escherichia coli. Since patient's status did not improve and needed further acute care patient was transferred to [facility] cone from the rehabilitation from fourth floor. Patient on my exam denies any chest pain shortness of breath. Has distended abdomen. Has no abdominal pain at this time. Denies any fever chills. Is alert awake oriented.   Patient's NGT out.  Advanced to Full Liquids 3/16.  Reports he's been eating a little.  Drinking his Resource Breeze supplements.  No nausea.  Patient is receiving TPN via PICC line with Clinimix E 5/15 @ 40 ml/hr and lipids @ 10 ml/hr. Provides 1162 kcal and 48 grams protein per day. Meets 55% minimum estimated energy needs and 44% minimum estimated protein needs.  RD feels pt is meeting </= 90% of estimated nutrition needs with current TPN and PO intake (% of meals &  supplements).  Height: Ht Readings from Last 1 Encounters:  09/10/13 _0  (1.778 m)    Weight: Wt Readings from Last 1 Encounters:  09/23/13 169 lb 12.1 oz (77 kg)    BMI:  Body mass index is 24.36 kg/(m^2).  Re-estimated nutrition needs: Kcal: 2100-2300 Protein: 110-120 gm Fluid: 2.1-2.3 L  Skin: Intact  Diet Order: Full Liquid   Intake/Output Summary (Last 24 hours) at 09/23/13 1154 Last data filed at 09/23/13 0730  Gross per 24 hour  Intake    720 ml  Output   3100 ml  Net  -2380 ml    Labs:   Recent Labs Lab 09/18/13 0542  09/21/13 0500 09/22/13 0500 09/23/13 0525  NA 136*  < > 134* 135* 138  K 4.0  < > 4.3 4.2 4.1  CL 99  < > 102 100 103  CO2 26  < > _1 BUN 19  < > _2 CREATININE 0.86  < > 0.74 0.71 0.75  CALCIUM 8.2*  < > 8.6 8.8 9.0  MG 1.8  --   --  1.8 1.8  PHOS 4.1  --   --  4.6 4.5  GLUCOSE 120*  < > 119* 115* 106*  < > = values in this interval not displayed.  CBG (last 3)   Recent Labs  09/20/13 1631 09/21/13 0014 09/21/13 0507  GLUCAP 104* 134* 121*    Scheduled Meds: . aspirin  325 mg Oral Daily  . enoxaparin (LOVENOX) injection  40 mg Subcutaneous QHS  . famotidine  20 mg Oral BID  . feeding  supplement (RESOURCE BREEZE)  1 Container Oral TID BM  . FLUoxetine  20 mg Oral Daily  . metoprolol tartrate  25 mg Oral BID  . metronidazole  500 mg Intravenous Q8H  . nitrofurantoin (macrocrystal-monohydrate)  100 mg Oral BID  . simvastatin  20 mg Oral q1800  . vancomycin  250 mg Oral 4 times per day    Continuous Infusions: . sodium chloride 50 mL/hr at 09/22/13 0413  . Marland KitchenTPN (CLINIMIX-E) Adult 60 mL/hr at 09/22/13 1739   And  . fat emulsion 240 mL (09/22/13 1739)  . Marland KitchenTPN (CLINIMIX-E) Adult     And  . fat emulsion      Past Medical History  Diagnosis Date  . Alcoholic hepatitis   . Hypertension   . 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 Shortness of breath     new onset- occasionally-at rest and  exertion  . GERD (gastroesophageal reflux disease)   . Anxiety   . TIA (transient ischemic attack) 08/2013  . Arthritis     " in my spine "  . Ileus 09/2013    Past Surgical History  Procedure Laterality Date  . Hand surgery Right   . Hernia repair    . Orif ankle fracture Right 12/10/2012    Procedure: OPEN REDUCTION INTERNAL FIXATION (ORIF) ANKLE FRACTURE;  Surgeon: Hessie Dibble, MD;  Location: [facility];  Service: Orthopedics;  Laterality: Right;  . Robot assisted laparoscopic radical prostatectomy N/A 08/29/2013    Procedure: ROBOTIC ASSISTED LAPAROSCOPIC RADICAL PROSTATECTOMY LEVEL 2, Lysis of adhesions;  Surgeon: Molli Hazard, MD;  Location: [facility];  Service: Urology;  Laterality: N/A;  . Lymphadenectomy Bilateral 08/29/2013    Procedure: LYMPHADENECTOMY "BILATERAL PELVIC LYMPH NODE DISSECTION";  Surgeon: Molli Hazard, MD;  Location: [facility];  Service: Urology;  Laterality: Bilateral;    Arthur Holms, RD, LDN Pager #: [phone] After-Hours Pager #: [phone]

## 2013-09-23 NOTE — Progress Notes (Signed)
PT Cancellation Note  Patient Details Name: Tanner [name] MRN: [id number] DOB: [birth year]   Cancelled Treatment:    Reason Eval/Treat Not Completed: Patient declined, too worn out this afternoon. 09/23/2013  Donnella Sham, [place] [phone]  (pager)   Jimel Myler, Tessie Fass 09/23/2013, 4:39 PM

## 2013-09-23 NOTE — Progress Notes (Signed)
Urology Progress Note  Subjective:     No acute urologic events overnight.  No nausea. Tolerating full liquid diet. Regaining strength in RUE/RLE.   Up and ambulating more.   ROS: Positive: RUE/RLE weakness.  Negative: Chest pain.  Objective:  Patient Vitals for the past 24 hrs:  BP Temp Temp src Pulse Resp SpO2 Weight  09/23/13 0533 151/100 mmHg 98.4 F (36.9 C) Oral 93 18 96 % 77 kg (169 lb 12.1 oz)  09/22/13 1500 129/89 mmHg 98.4 F (36.9 C) Oral 84 20 96 % -    Physical Exam: General:  No acute distress, awake Cardiovascular:    [x]   S1/S2 present, Mildly tachycardic.  []   Irregularly irregular Chest:  CTA-B Abdomen:               []  Soft, appropriately TTP  [x]  Soft, Distended. No rebound TTP. Incisions c/d/i without erythema.  []  Soft, appropriately TTP, incision(s) clean/dry/intact  Genitourinary: Foley in place. Clear urine. Negative edema.     I/O last 3 completed shifts: In: 1830.8 [P.O.:720; I.V.:1110.8] Out: 6250 [Urine:6250]  Recent Labs     09/22/13  0500  HGB  9.0*  WBC  6.4  PLT  192    Recent Labs     09/22/13  0500  09/23/13  0525  NA  135*  138  K  4.2  4.1  CL  100  103  CO2  24  25  BUN  16  17  CREATININE  0.71  0.75  CALCIUM  8.8  9.0  GFRNONAA  >90  >90  GFRAA  >90  >90     No results found for this basename: PT, INR, APTT,  in the last 72 hours   No components found with this basename: ABG,     Length of stay: 13 days.  Assessment: UTI. C. Diff colitis.  Robotic radical prostatectomy 08/29/13.   Plan: -Continue conservative management.  -D/c foley today; place condom cath.  -Continue nitrofurantoin for 48 more hours.    Rolan Bucco, MD [phone]

## 2013-09-23 NOTE — Progress Notes (Signed)
PARENTERAL NUTRITION CONSULT NOTE - FOLLOW UP  Pharmacy Consult:  TPN  Indication:  Prolonged ileus vs SBO  Allergies  Allergen Reactions  . Dilaudid [Hydromorphone Hcl] Nausea And Vomiting    Pre pt history  . Oxycodone Nausea And Vomiting  . Procaine Hcl Other (See Comments)    Patient states he is allergic to novicaine  . Zofran [Ondansetron] Nausea And Vomiting  . Sulfa Antibiotics Rash    Patient Measurements: Height: 5\' 10"  (177.8 cm) Weight: 169 lb 12.1 oz (77 kg) IBW/kg (Calculated) : 73  Vital Signs: Temp: 98.4 F (36.9 C) (03/17 0533) Temp src: Oral (03/17 0533) BP: 151/100 mmHg (03/17 0533) Pulse Rate: 93 (03/17 0533) Intake/Output from previous day: 03/16 0701 - 03/17 0700 In: 720 [P.O.:720] Out: 3900 [Urine:3900]  Labs:  Recent Labs  09/22/13 0500  WBC 6.4  HGB 9.0*  HCT 26.6*  PLT 192     Recent Labs  09/21/13 0500 09/22/13 0500 09/23/13 0525  NA 134* 135* 138  K 4.3 4.2 4.1  CL 102 100 103  CO2 22 24 25   GLUCOSE 119* 115* 106*  BUN 16 16 17   CREATININE 0.74 0.71 0.75  CALCIUM 8.6 8.8 9.0  MG  --  1.8 1.8  PHOS  --  4.6 4.5  PROT  --  5.9*  --   ALBUMIN  --  2.5*  --   AST  --  12  --   ALT  --  13  --   ALKPHOS  --  53  --   BILITOT  --  <0.2*  --   PREALBUMIN  --  26.3  --   TRIG  --  151*  --    Estimated Creatinine Clearance: 107.7 ml/min (by C-G formula based on Cr of 0.75).    Recent Labs  09/20/13 1631 09/21/13 0014 09/21/13 0507  GLUCAP 104* 134* 121*   Insulin Requirements in the past 24 hours:  SSI d/c'd 3/10 CBGs 106-115  Current Nutrition:  -Clinimix E 5/15 at 86ml/hr + lipid 20% emulsion at 25mL/hr = 72g protein and 1584 kcal daily -Resource Breeze TID (9g protein and 250kcal per serving) -started full liquid diet yesterday- 50% of breakfast and lunch charted, patient told me he ate almost all of his dinner and about half of his breakfast this morning  Nutritional Goals:  2100-2300 kCal, 110-120 grams  of protein per day per RD recommendations 3/13  Assessment: 46 YOM admitted on 08/29/13 for work-up of prostate cancer and underwent radical robotic prostatectomy.  He was diagnosed with a new CVA post-op as well as right ICA aneurysm.  Discharged to Rehab on 09/06/13 and was later found to have an ileus requiring NGT for decompression.  Returned to the acute care services on 09/10/13 for further management.  Pharmacy consulted to manage TPN due to prolonged ileus vs SBO.  GI: hx GERD, +C.diff colitis. Prealbumin is now normal at 26.3.  KUB 3/12 showed marked improvement; if worsens, plan SBFT. NG tube out, but flexiseal remains. Advancing diet, also drinking Resource Breeze  Endo: no hx DM, A1c 5% - CBGs controlled <150, off SSI  Lytes: Na 138, K 4.1, Phos 4.5, Mag 1.8, CorCa ~10  Renal: SCr 0.75, CrCl >132mL/min, UOP 1.69mL/kg/hr. Foley removed today- condom cath placed. On MacroBID x 48 more hours  Cards: HTN - VSS on IV hydralazine prn, resumed po ASA325 and metoprolol.  Hepatobil: EtOH hepatitis - LFTs WNL except low albumin (2.5) and TBili (<0.2). Triglycerides 151  Neuro: anxiety / recent CVA- now resumed on ASA. 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 Depression on fluoxetine  ID: PO vancomycin and Flagyl for + CDiff. WBC 6.4, afebrile  Best Practices: Lovenox 40, Pepcid PO  TPN Access:  PICC placed 09/09/13  TPN day#: 11 (3/6 >> )  Plan:  1. Will decrease Clinimix E 5/15 further to 40 ml/hr + continue lipid 20% emulsion at 10 ml/hr- this will provide 48g protein and 1200 kcal daily (when added with TID Resource Breeze, will be getting 75g protein and 1950kcal total)- hoping this decrease will encourage even more PO intake- also informed Tanner [name] of this.  2. Daily IV multivitamin, folic acid 1mg , and thiamine 100mg  in TPN 3. Trace elements on MWF only d/t national shortage 4. Continue 1/2NS at 18mL/hr per MD 5. BMET, Mag and Phos tomorrow morning  Tanner [name], PharmD, BCPS Clinical  Pharmacist Pager: [phone] 09/23/2013 9:29 AM

## 2013-09-24 DIAGNOSIS — S82899A Other fracture of unspecified lower leg, initial encounter for closed fracture: Secondary | ICD-10-CM

## 2013-09-24 LAB — BASIC METABOLIC PANEL
BUN: 15 mg/dL (ref 6–23)
CO2: 21 meq/L (ref 19–32)
Calcium: 8.7 mg/dL (ref 8.4–10.5)
Chloride: 99 mEq/L (ref 96–112)
Creatinine, Ser: 0.71 mg/dL (ref 0.50–1.35)
GFR calc Af Amer: >90 mL/min (ref >90)
GLUCOSE: 99 mg/dL (ref 70–99)
Potassium: 3.9 mEq/L (ref 3.7–5.3)
SODIUM: 135 meq/L — AB (ref 137–147)

## 2013-09-24 LAB — PHOSPHORUS: Phosphorus: 3.9 mg/dL (ref 2.3–4.6)

## 2013-09-24 LAB — MAGNESIUM: MAGNESIUM: 1.6 mg/dL (ref 1.5–2.5)

## 2013-09-24 MED ORDER — DIPHENHYDRAMINE HCL 25 MG PO CAPS
25.0000 mg | ORAL_CAPSULE | Freq: Four times a day (QID) | ORAL | Status: DC | PRN
  Administered 2013-09-25 – 2013-09-29 (×8): 25 mg via ORAL
  Filled 2013-09-24 (×8): qty 1

## 2013-09-24 MED ORDER — FAT EMULSION 20 % IV EMUL
240.0000 mL | INTRAVENOUS | Status: AC
  Administered 2013-09-24: 240 mL via INTRAVENOUS
  Filled 2013-09-24: qty 250

## 2013-09-24 MED ORDER — MAGNESIUM SULFATE 40 MG/ML IJ SOLN
2.0000 g | Freq: Once | INTRAMUSCULAR | Status: AC
  Administered 2013-09-24: 2 g via INTRAVENOUS
  Filled 2013-09-24: qty 50

## 2013-09-24 MED ORDER — TRACE MINERALS CR-CU-F-FE-I-MN-MO-SE-ZN IV SOLN
INTRAVENOUS | Status: AC
  Administered 2013-09-24: 18:00:00 via INTRAVENOUS
  Filled 2013-09-24: qty 1000

## 2013-09-24 NOTE — Progress Notes (Signed)
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 Now transfered back to acute for ileus vs SBO.   OT comments  Pt is highly motivated to work with OT.  Continues to gain strength in UEs, particularly R, ataxia noted to increase with fatigue. Tolerating unsupported sitting for grooming activities at EOB x 15 minutes, but not yet able to stand at sink and complete.  Pt remains a good candidate for inpatient rehab.  Follow Up Recommendations  CIR    Barriers to Discharge       Equipment Recommendations  3 in 1 bedside comode    Recommendations for Other Services    Frequency Min 3X/week   Progress towards OT Goals Progress towards OT goals: Progressing toward goals  Plan Discharge plan remains appropriate    Precautions / Restrictions Precautions Precautions: Fall Precaution Comments: foley, rectocele, IV   Pertinent Vitals/Pain VSS, no c/o pain    ADL  Eating/Feeding:  (pt reports self feeding with L as lead due to lines in R) Where Assessed - Eating/Feeding: Edge of bed Grooming: Wash/dry hands;Wash/dry face;Set up Where Assessed - Grooming: Unsupported sitting Transfers/Ambulation Related to ADLs: sat EOB for grooming x 15 minutes with initial dizziness which resolved    OT Diagnosis:    OT Problem List:   OT Treatment Interventions:     OT Goals(current goals can now be found in the care plan section) Acute Rehab OT Goals Patient Stated Goal: recover and get back to being independent  Visit Information  Last OT Received On: 09/24/13 Assistance Needed: +1 History of Present Illness: 58 yo male s/p prostatectomy 2/20 for  malignant neoplasm and 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 Now transfered back to acute for ileus vs SBO.    Subjective Data      Prior Functioning       Cognition  Cognition Arousal/Alertness: Awake/alert Behavior During Therapy: WFL for tasks assessed/performed Overall Cognitive Status: Within Functional Limits for tasks assessed    Mobility  Bed Mobility Overal bed mobility: Needs Assistance Bed Mobility: Supine to Sit;Sit to Supine Supine to sit: Min guard Sit to supine: Min assist General bed mobility comments: min guard to manage lines    Exercises  General Exercises - Upper Extremity Shoulder Flexion: Strengthening;Both;15 reps;Theraband Theraband Level (Shoulder Flexion): Level 3 (Green) Shoulder Extension: Both;15 reps;Theraband;Seated Theraband Level (Shoulder Extension): Level 3 (Green) Shoulder Horizontal ABduction: Both;15 reps;Supine;Theraband Theraband Level (Shoulder Horizontal Abduction): Level 3 (Green) Elbow Flexion: Both;15 reps;Supine;Theraband Theraband Level (Elbow Flexion): Level 3 (Green) Elbow Extension: Both;15 reps;Supine;Theraband    Balance Balance Overall balance assessment: Needs assistance Sitting-balance support: No upper extremity supported Sitting balance-Leahy Scale: Good Standing balance support: Single extremity supported Standing balance-Leahy Scale: Fair  End of Session OT - End of Session Activity Tolerance: Patient tolerated treatment well Patient left: in bed;with call bell/phone within reach Nurse Communication:  (needs condom cath replaced)  GO     Malka So 09/24/2013, 2:37 PM [phone]

## 2013-09-24 NOTE — Progress Notes (Signed)
Urology Progress Note  Subjective:     No acute urologic events overnight.  Foley d/c'd yesterday; condom cath in place for leakage. States he feels he has to strain to void, but this may just be the condom cath.   Up and ambulating more.   ROS: Positive: RUE/RLE weakness.  Negative: Chest pain.  Objective:  Patient Vitals for the past 24 hrs:  BP Temp Temp src Pulse Resp SpO2 Weight  09/24/13 0448 - - - - - - 76 kg (167 lb 8.8 oz)  09/24/13 0440 149/94 mmHg 98.7 F (37.1 C) Oral 90 17 97 % -  09/23/13 2013 143/81 mmHg 98.8 F (37.1 C) Oral 96 18 97 % -  09/23/13 1301 140/92 mmHg 98.6 F (37 C) Oral 97 18 99 % -    Physical Exam: General:  No acute distress, awake Cardiovascular:    [x]   S1/S2 present, Mildly tachycardic.  []   Irregularly irregular Chest:  CTA-B Abdomen:               []  Soft, appropriately TTP  [x]  Soft, Distended. No rebound TTP. Incisions c/d/i without erythema.  []  Soft, appropriately TTP, incision(s) clean/dry/intact  Genitourinary: Condom cath. Clear urine. Negative edema.     I/O last 3 completed shifts: In: 1440 [P.O.:1440] Out: 0350 [Urine:5400; Stool:3]  Recent Labs     09/22/13  0500  HGB  9.0*  WBC  6.4  PLT  192    Recent Labs     09/22/13  0500  09/23/13  0525  NA  135*  138  K  4.2  4.1  CL  100  103  CO2  24  25  BUN  16  17  CREATININE  0.71  0.75  CALCIUM  8.8  9.0  GFRNONAA  >90  >90  GFRAA  >90  >90     No results found for this basename: PT, INR, APTT,  in the last 72 hours   No components found with this basename: ABG,     Length of stay: 14 days.  Assessment: UTI. C. Diff colitis.  Robotic radical prostatectomy 08/29/13.   Plan: -Continue conservative management.  -nitrofurantoin for 24 more hours.    Rolan Bucco, MD [phone]

## 2013-09-24 NOTE — Progress Notes (Signed)
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 Now transfered back to acute for ileus vs SBO.   PT Comments   Doing well with exercise, mobility and gait.  Mild residual weakness R side.  Still would be a good ST rehab candidate once medically able.   Follow Up Recommendations  CIR     Does the patient have the potential to tolerate intense rehabilitation     Barriers to Discharge        Equipment Recommendations       Recommendations for Other Services    Frequency Min 3X/week   Progress towards PT Goals Progress towards PT goals: Progressing toward goals  Plan Current plan remains appropriate    Precautions / Restrictions Precautions Precautions: Fall Restrictions Weight Bearing Restrictions: No   Pertinent Vitals/Pain     Mobility  Bed Mobility Overal bed mobility: Needs Assistance Bed Mobility: Supine to Sit;Sit to Sidelying Supine to sit: Min guard Sit to supine: Min assist General bed mobility comments: min guard to manage lines Transfers Overall transfer level: Needs assistance Transfers: Sit to/from Stand Sit to Stand: Min guard General transfer comment: min guard for safety and to manage lines/tubes Ambulation/Gait Ambulation/Gait assistance: Min assist Ambulation Distance (Feet): 35 Feet Assistive device:  (pushing pole) Gait Pattern/deviations: Step-through pattern;Trendelenburg (mild hip drop R) Gait velocity: decreased Gait velocity interpretation: Below normal speed for age/gender Modified Rankin (Stroke Patients Only) Modified Rankin: Moderate disability    Exercises General Exercises - Lower  Extremity Heel Slides: AROM;Strengthening;Both;15 reps;Supine Hip ABduction/ADduction: AROM;Both;15 reps;Supine Straight Leg Raises: AROM;Both;15 reps;Supine Low Level/ICU Exercises Stabilized Bridging: AROM;Strengthening;Both;10 reps;Supine Other Exercises Other Exercises: resisted bicep and tricep/ shd press bil x 10   PT Diagnosis:    PT Problem List:   PT Treatment Interventions:     PT Goals (current goals can now be found in the care plan section) Acute Rehab PT Goals Patient Stated Goal: recover and get back to being independent PT Goal Formulation: With patient Time For Goal Achievement: 09/25/13 Potential to Achieve Goals: Good  Visit Information  Last PT Received On: 09/24/13 Assistance Needed: +1 History of Present Illness: 58 yo male s/p prostatectomy 2/20 for malignant neoplasm and 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 Now transfered back to acute for ileus vs SBO.    Subjective Data  Subjective: We have to stay in here now because of the leakage. Patient Stated Goal: recover and get back to being independent   Cognition  Cognition Arousal/Alertness: Awake/alert Behavior During Therapy: WFL for tasks assessed/performed Overall Cognitive Status: Within Functional Limits for tasks assessed    Balance  Balance Overall balance assessment: Needs assistance Sitting-balance support: No upper extremity supported Sitting balance-Leahy Scale: Fair Standing balance support: Single extremity supported Standing balance-Leahy Scale: Fair  End of Session PT - End of Session Activity Tolerance: Patient tolerated treatment well;Other (comment) (limited to in room due to c diff) Patient left: in bed;with call bell/phone within reach Nurse Communication: Mobility status   GP     [name], Tessie Fass 09/24/2013, 11:51 AM 09/24/2013  Donnella Sham, PT [phone]  (pager)

## 2013-09-24 NOTE — Progress Notes (Signed)
PARENTERAL NUTRITION CONSULT NOTE - FOLLOW UP  Pharmacy Consult:  TPN  Indication:  Prolonged ileus vs SBO  Allergies  Allergen Reactions  . Dilaudid [Hydromorphone Hcl] Nausea And Vomiting    Pre pt history  . Oxycodone Nausea And Vomiting  . Procaine Hcl Other (See Comments)    Patient states he is allergic to novicaine  . Zofran [Ondansetron] Nausea And Vomiting  . Sulfa Antibiotics Rash    Patient Measurements: Height: 5\' 10"  (177.8 cm) Weight: 167 lb 8.8 oz (76 kg) IBW/kg (Calculated) : 73  Vital Signs: Temp: 98.7 F (37.1 C) (03/18 0440) Temp src: Oral (03/18 0440) BP: 133/84 mmHg (03/18 0838) Pulse Rate: 102 (03/18 0838) Intake/Output from previous day: 03/17 0701 - 03/18 0700 In: 720 [P.O.:720] Out: 4343 [Urine:2900; PYPPJ:0932]  Labs:  Recent Labs  09/22/13 0500  WBC 6.4  HGB 9.0*  HCT 26.6*  PLT 192     Recent Labs  09/22/13 0500 09/23/13 0525 09/24/13 0535  NA 135* 138 135*  K 4.2 4.1 3.9  CL 100 103 99  CO2 24 25 21   GLUCOSE 115* 106* 99  BUN 16 17 15   CREATININE 0.71 0.75 0.71  CALCIUM 8.8 9.0 8.7  MG 1.8 1.8 1.6  PHOS 4.6 4.5 3.9  PROT 5.9*  --   --   ALBUMIN 2.5*  --   --   AST 12  --   --   ALT 13  --   --   ALKPHOS 53  --   --   BILITOT <0.2*  --   --   PREALBUMIN 26.3  --   --   TRIG 151*  --   --    Estimated Creatinine Clearance: 107.7 ml/min (by C-G formula based on Cr of 0.71).   Insulin Requirements in the past 24 hours:  SSI d/c'd 3/10; CBGs 99-106  Current Nutrition:  -Clinimix E 5/15 at 36ml/hr + lipid 20% emulsion at 73mL/hr = 48g protein and 1200 kcal daily -Resource Breeze TID (9g protein and 250kcal per serving) -started full liquid diet 3/16- 50% of lunch and dinner charted 3/16, 50% breakfast, 25% lunch and dinner 3/17, 25% of breakfast this morning  Nutritional Goals:  2100-2300 kCal, 110-120 grams of protein per day per RD recommendations 3/17  Assessment: 69 YOM admitted on 08/29/13 for work-up of  prostate cancer and underwent radical robotic prostatectomy.  He was diagnosed with a new CVA post-op as well as right ICA aneurysm.  Discharged to Rehab on 09/06/13 and was later found to have an ileus requiring NGT for decompression.  Returned to the acute care services on 09/10/13 for further management.  Pharmacy consulted to manage TPN due to prolonged ileus vs SBO.  GI: hx GERD, +C.diff colitis. Prealbumin is now normal at 26.3.  KUB 3/12 showed marked improvement; if worsens, plan SBFT. NG tube out, but flexiseal remains until stool is less watery. Advancing diet, also drinking [employer]. Patient without n/v, doing ok with diet but not with adequate intake for caloric needs. May advance to bland diet today.  Endo: no hx DM, A1c 5% - CBGs controlled <150, off SSI  Lytes: Na 135, K 3.9, Phos 3.9, Mag 1.6, CorCa ~10  Renal: SCr 0.71, CrCl >175mL/min, UOP 0.69mL/kg/hr. Foley removed- condom cath placed. On MacroBID x 24 more hours  Cards: HTN - VSS on IV hydralazine prn, resumed po ASA325 and metoprolol. Started on simvastatin  Hepatobil: EtOH hepatitis - LFTs WNL except low albumin (2.5) and TBili (<  0.2). Triglycerides 151  Neuro: anxiety / recent CVA- now resumed on ASA, also now on statin. 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 Depression on fluoxetine  ID: PO vancomycin and Flagyl for + CDiff- need to establish LOT. WBC 6.4, afebrile  Best Practices: Lovenox 40, Pepcid PO  TPN Access:  PICC placed 09/09/13  TPN day#: 12 (3/6 >> )  Plan:  1. Magnesium sulfate 2g IV x1 for repletion as mag goal for SBO is 2 2. Continue Clinimix E 5/15 at 40 ml/hr + lipid 20% emulsion at 10 ml/hr- this will provide 48g protein and 1200 kcal daily (when added with TID Resource Breeze, will be getting 75g protein and 1950kcal total daily which is 68% of protein needs and 92% of kcal needs) 3. Daily IV multivitamin, folic acid 1mg , and thiamine 100mg  in TPN 4. Trace elements on MWF only d/t national shortage 5.  Continue 1/2NS at 63mL/hr per MD 6. TPN labs as ordered  Lynee Rosenbach D. Hilary Pundt, PharmD, BCPS Clinical Pharmacist Pager: [phone] 09/24/2013 9:52 AM

## 2013-09-24 NOTE — Progress Notes (Signed)
Patient ID: Tanner [name], male   DOB: 1955-07-25, 58 y.o.   MRN: [id number]  [facility] PROGRESS NOTE  Kaz Auld RCV:[id number] DOB: 11/03/1955 DOA: 09/10/2013 PCP: Criselda Peaches, MD  Brief narrative: 58 year old male with history of prostate cancer, hypertension, alcoholic hepatitis, status post radical robotic prostatectomy with bilateral pelvic lymph node dissection and lysis of colonic additions on 08/29/13, postop acute left corona radiata infarct with associated right hemiparesis on 09/02/13, transferred to inpatient rehabilitation on 09/05/13, developed nausea, vomiting, diarrhea and abdominal distention. GI was consulted and diagnosed with postop ileus, C. diff colitis, narcotics were stopped, NG tube and rectal tube were placed, started on IV Reglan, found to be hypokalemic and urine cultures grew Escherichia coli 55,000 colonies. Due to worsening condition, he was transferred to the hospital on 09/10/13 for further management.   Assessment/Plan:  SBO - multifactorial and secondary to adhesions, immobility, C. Diff - slow clinical improvement, tolerating clear liquid diet fairly well but had one episode of vomiting overnight, non bloody - NG out 3/12,  - continue supportive care with IVF, analgesia, antiemetics as needed  - still has rectal tube and will keep for now due to persistent diarrhea  - GI & surgery were following, signed off 3/15  Hypokalemia - secondary to GI loses - supplemented and now WNL this AM - repeat BMP in AM C. difficile colitis - continue Flagyl and Vancomycin PO  Escherichia coli UTI versus colonization - Patient was on day 7 of IV antibiotics - changed from Rocephin to cefazolin then stopped S/P Radical prostatectomy  Hypertension - reasonable inpatient control  - continue Metoprolol PO  Anemia of chronic disease  - Hg and Hct stable over the past 48 hours - repeat CBC in AM Recent stroke with right hemiparesis:  - PT inpatient and will likely  need to go to SNF upon discharge  Incidental finding of right ICA aneurysm - Will need followup with repeat imaging in one year. Anxiety and depression - Psychiatry input on 3/9 appreciated. - Ativan PRN   Code Status: Full  Family Communication: Discussed with patient's sister  Disposition Plan: DC to CIR, hopefully later this week   Consultants:  Gastroenterology  General surgery  Urology Procedures:  Foley catheter  NG tube  Rectal tube Antibiotics:  IV cefazolin 3/3 > 3/4  IV Rocephin 3/4 > 3/5  IV cefazolin 3/5 > 3/10  HPI/Subjective: No events overnight.   Objective: Filed Vitals:   09/23/13 2013 09/24/13 0440 09/24/13 0448 09/24/13 0838  BP: 143/81 149/94  133/84  Pulse: 96 90  102  Temp: 98.8 F (37.1 C) 98.7 F (37.1 C)    TempSrc: Oral Oral    Resp: 18 17  20   Height:      Weight:   76 kg (167 lb 8.8 oz)   SpO2: 97% 97%  97%    Intake/Output Summary (Last 24 hours) at 09/24/13 1837 Last data filed at 09/24/13 0801  Gross per 24 hour  Intake    120 ml  Output   2840 ml  Net  -2720 ml    Exam:   General:  Pt is alert, follows commands appropriately, not in acute distress  Cardiovascular: Regular rhythm, tachycardic, S1/S2, no murmurs, no rubs, no gallops  Respiratory: Clear to auscultation bilaterally, no wheezing, diminished breath sounds at bases   Abdomen: Soft, non tender, non distended, bowel sounds present, no guarding  Extremities: No edema, pulses DP and PT palpable bilaterally  Data Reviewed: Basic Metabolic  Panel:  Recent Labs Lab 09/18/13 0542  09/20/13 0520 09/21/13 0500 09/22/13 0500 09/23/13 0525 09/24/13 0535  NA 136*  < > 135* 134* 135* 138 135*  K 4.0  < > 4.4 4.3 4.2 4.1 3.9  CL 99  < > 101 102 100 103 99  CO2 26  < > 22 22 24 25 21   GLUCOSE 120*  < > 115* 119* 115* 106* 99  BUN 19  < > 15 16 16 17 15   CREATININE 0.86  < > 0.77 0.74 0.71 0.75 0.71  CALCIUM 8.2*  < > 8.6 8.6 8.8 9.0 8.7  MG 1.8  --   --   --   1.8 1.8 1.6  PHOS 4.1  --   --   --  4.6 4.5 3.9  < > = values in this interval not displayed. Liver Function Tests:  Recent Labs Lab 09/18/13 0542 09/22/13 0500  AST 31 12  ALT 25 13  ALKPHOS 54 53  BILITOT <0.2* <0.2*  PROT 5.8* 5.9*  ALBUMIN 2.3* 2.5*   CBC:  Recent Labs Lab 09/18/13 0542 09/20/13 0520 09/22/13 0500  WBC 9.4 7.8 6.4  NEUTROABS  --   --  4.5  HGB 9.2* 8.9* 9.0*  HCT 27.3* 26.6* 26.6*  MCV 97.8 96.0 96.4  PLT 199 200 192   CBG:  Recent Labs Lab 09/20/13 0043 09/20/13 0559 09/20/13 1631 09/21/13 0014 09/21/13 0507  GLUCAP 112* 115* 104* 134* 121*   Scheduled Meds: . aspirin  325 mg Oral Daily  . enoxaparin  injection  40 mg Subcutaneous QHS  . famotidine  20 mg Oral BID  . FLUoxetine  20 mg Oral Daily  . metoprolol tartrate  25 mg Oral BID  . metronidazole  500 mg Intravenous Q8H  . nitrofurantoin   100 mg Oral BID  . simvastatin  20 mg Oral q1800  . vancomycin  250 mg Oral 4 times per day   Continuous Infusions: . sodium chloride 50 mL/hr at 09/24/13 1036  . Marland KitchenTPN (CLINIMIX-E) Adult 40 mL/hr at 09/24/13 1741   And  . fat emulsion 240 mL (09/24/13 1742)   Faye Ramsay, MD  [place] Pager [phone]  If 7PM-7AM, please contact night-coverage [email or web address] Password [facility] 09/24/2013, 6:37 PM   LOS: 14 days

## 2013-09-24 NOTE — Progress Notes (Signed)
I continue to follow pt's progress. I spoke with him at bedside. Rectal tube remains and on clear liquid diet with TNA. Pt is hopeful that once rectal tube removed and on diet, that he could go directly home with home health and not to need inpt rehab readmission at that time. I will follow his progress. [phone]

## 2013-09-25 LAB — COMPREHENSIVE METABOLIC PANEL
ALBUMIN: 2.5 g/dL — AB (ref 3.5–5.2)
ALT: 10 U/L (ref 0–53)
AST: 19 U/L (ref 0–37)
Alkaline Phosphatase: 54 U/L (ref 39–117)
BUN: 12 mg/dL (ref 6–23)
CALCIUM: 8.7 mg/dL (ref 8.4–10.5)
CO2: 22 meq/L (ref 19–32)
CREATININE: 0.72 mg/dL (ref 0.50–1.35)
Chloride: 94 mEq/L — ABNORMAL LOW (ref 96–112)
GFR calc Af Amer: >90 mL/min (ref >90)
GFR calc non Af Amer: >90 mL/min (ref >90)
Glucose, Bld: 506 mg/dL — ABNORMAL HIGH (ref 70–99)
Potassium: 5.2 mEq/L (ref 3.7–5.3)
Sodium: 129 mEq/L — ABNORMAL LOW (ref 137–147)
Total Bilirubin: <0.2 mg/dL — ABNORMAL LOW (ref 0.3–1.2)
Total Protein: 5.8 g/dL — ABNORMAL LOW (ref 6.0–8.3)

## 2013-09-25 LAB — MAGNESIUM: Magnesium: 2 mg/dL (ref 1.5–2.5)

## 2013-09-25 LAB — PHOSPHORUS: PHOSPHORUS: 5.6 mg/dL — AB (ref 2.3–4.6)

## 2013-09-25 MED ORDER — ALTEPLASE 2 MG IJ SOLR
2.0000 mg | Freq: Once | INTRAMUSCULAR | Status: AC
  Administered 2013-09-25: 2 mg
  Filled 2013-09-25: qty 2

## 2013-09-25 NOTE — Progress Notes (Signed)
PARENTERAL NUTRITION CONSULT NOTE - FOLLOW UP  Pharmacy Consult:  TPN  Indication:  Prolonged ileus vs SBO  Allergies  Allergen Reactions  . Tanner [Hydromorphone [name]] Nausea And Vomiting    Pre pt history  . Tanner [name] Nausea And Vomiting  . Tanner [name] Other (See Comments)    Patient states he is allergic to novicaine  . Tanner [name] [Ondansetron] Nausea And Vomiting  . Tanner [name] Rash    Patient Measurements: Height: 5\' 10"  (177.8 cm) Weight: 165 lb (74.844 kg) IBW/kg (Calculated) : 73  Vital Signs: Temp: 98.2 F (36.8 C) (03/19 0429) Temp src: Oral (03/19 0429) BP: 139/86 mmHg (03/19 0429) Pulse Rate: 99 (03/19 0429) Intake/Output from previous day: 03/18 0701 - 03/19 0700 In: 120 [P.O.:120] Out: 1200 [Urine:1200]  Labs: No results found for this basename: WBC, HGB, HCT, PLT, APTT, INR,  in the last 72 hours   Recent Labs  09/23/13 0525 09/24/13 0535 09/25/13 0700  NA 138 135* 129*  K 4.1 3.9 5.2  CL 103 99 94*  CO2 25 21 22   GLUCOSE 106* 99 506*  BUN 17 15 12   CREATININE 0.75 0.71 0.72  CALCIUM 9.0 8.7 8.7  MG 1.8 1.6 2.0  PHOS 4.5 3.9 5.6*  PROT  --   --  5.8*  ALBUMIN  --   --  2.5*  AST  --   --  19  ALT  --   --  10  ALKPHOS  --   --  54  BILITOT  --   --  <0.2*   Estimated Creatinine Clearance: 107.7 ml/min (by C-G formula based on Cr of 0.72).   Insulin Requirements in the past 24 hours:  SSI d/c'd 3/10; CBGs 99-106  Current Nutrition:  -Clinimix E 5/15 at 68ml/hr + lipid 20% emulsion at 85mL/hr = 48g protein and 1200 kcal daily -Resource Breeze TID (9g protein and 250kcal per serving) -started full liquid diet 3/16- eating 25-50% of each meal  Nutritional Goals:  2100-2300 kCal, 110-120 grams of protein per day per RD recommendations 3/17  Assessment: 38 YOM admitted on 08/29/13 for work-up of prostate cancer and underwent radical robotic prostatectomy.  He was diagnosed with a new CVA post-op as well as right ICA aneurysm.   Discharged to Rehab on 09/06/13 and was later found to have an ileus requiring NGT for decompression.  Returned to the acute care services on 09/10/13 for further management.  Pharmacy consulted to manage TPN due to prolonged ileus vs SBO.  GI: hx GERD, +C.diff colitis. Prealbumin is now normal at 26.3.  KUB 3/12 showed marked improvement; if worsens, plan SBFT. NG tube out, but flexiseal remains until stool is less watery. Still having significant amounts of stool. Drinking Tanner [name]. Patient doing ok with diet- had 1 episode of emesis overnight.  Endo: no hx DM, A1c 5% - CBGs controlled <150, off SSI  Lytes: Na 129, K 5.2 (note some hemolysis in sample), Phos 5.6, Mag 2, CorCa 10  Renal: SCr 0.72, CrCl >116mL/min, UOP 1.77mL/kg/hr. Foley removed- condom cath placed. MacroBID complete  Cards: HTN - VSS on IV hydralazine prn, resumed po ASA325 and metoprolol. Started on simvastatin  Hepatobil: EtOH hepatitis - LFTs WNL except low albumin (2.5) and TBili (<0.2). Triglycerides 151  Neuro: anxiety / recent CVA- now resumed on ASA, also now on statin. 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 Depression on fluoxetine  ID: PO vancomycin and Flagyl for + CDiff- need to establish LOT. WBC 6.4, afebrile  Best Practices:  Lovenox 40, Pepcid PO  TPN Access:  PICC placed 09/09/13  TPN day#: 13 (3/6 >>3/19 )  Plan:  1. Continue Clinimix E 5/15 at 40 ml/hr until 1700 tonight. At that time DECREASE rate to 49mL/hr x1 hour and then turn off completely 2. Continue lipid emulsion at 37mL/hr until order expires at 1800 3. Pharmacy to sign off for TPN. Please reconsult if needed.   Tanner [name], PharmD, BCPS Clinical Pharmacist Pager: [phone] 09/25/2013 9:47 AM

## 2013-09-25 NOTE — Progress Notes (Signed)
Patient ID: Tanner [name], male   DOB: 1956-03-24, 58 y.o.   MRN: [id number]  [facility] PROGRESS NOTE  Tanner [name] WFU:[id number] DOB: October 22, 1955 DOA: 09/10/2013 PCP: Tanner Peaches, MD  Brief narrative:  58 year old male with history of prostate cancer, hypertension, alcoholic hepatitis, status post radical robotic prostatectomy with bilateral pelvic lymph node dissection and lysis of colonic additions on 08/29/13, postop acute left corona radiata infarct with associated right hemiparesis on 09/02/13, transferred to inpatient rehabilitation on 09/05/13, developed nausea, vomiting, diarrhea and abdominal distention. GI was consulted and diagnosed with postop ileus, C. diff colitis, narcotics were stopped, NG tube and rectal tube were placed, started on IV Reglan, found to be hypokalemic and urine cultures grew Escherichia coli 55,000 colonies. Due to worsening condition, he was transferred to the hospital on 09/10/13 for further management.   Assessment/Plan:  SBO  - multifactorial and secondary to adhesions, immobility, C. Diff  - slow clinical improvement, tolerating clear liquid diet fairly well - plan on transitioning off TPN today  - NG out 3/12,  - continue supportive care with IVF, analgesia, antiemetics as needed  - still has rectal tube and will keep for now due to persistent diarrhea  - GI & surgery were following, signed off 3/15  Hypokalemia  - secondary to GI loses  - supplemented and now WNL this AM  - repeat BMP in AM  C. difficile colitis  - continue Flagyl and Vancomycin PO  Escherichia coli UTI versus colonization  - Patient was on day 7 of IV antibiotics  - changed from Rocephin to cefazolin then stopped  S/P Radical prostatectomy  Hypertension  - reasonable inpatient control  - continue Metoprolol PO  Anemia of chronic disease  - Hg and Hct stable over the past 48 hours  - repeat CBC in AM  Recent stroke with right hemiparesis:  - PT inpatient and will likely  need to go to SNF upon discharge  Incidental finding of right ICA aneurysm  - Will need followup with repeat imaging in one year.  Anxiety and depression  - Psychiatry input on 3/9 appreciated.  - Ativan PRN   Code Status: Full  Family Communication: Discussed with patient's sister  Disposition Plan: DC to CIR, hopefully later this week   Consultants:  Gastroenterology  General surgery  Urology Procedures:  Foley catheter  NG tube  Rectal tube Antibiotics:  IV cefazolin 3/3 > 3/4  IV Rocephin 3/4 > 3/5  IV cefazolin 3/5 > 3/10   HPI/Subjective: No events overnight.   Objective: Filed Vitals:   09/24/13 2216 09/25/13 0429 09/25/13 0500 09/25/13 0947  BP: 146/91 139/86  148/90  Pulse: 98 99  98  Temp: 98.8 F (37.1 C) 98.2 F (36.8 C)    TempSrc: Oral Oral    Resp: 18 17  18   Height:      Weight:   74.844 kg (165 lb)   SpO2: 98% 97%      Intake/Output Summary (Last 24 hours) at 09/25/13 1329 Last data filed at 09/25/13 1109  Gross per 24 hour  Intake    330 ml  Output   1700 ml  Net  -1370 ml    Exam:   General:  Pt is alert, follows commands appropriately, not in acute distress  Cardiovascular: Regular rate and rhythm, S1/S2, no murmurs, no rubs, no gallops  Respiratory: Clear to auscultation bilaterally, no wheezing, no crackles, no rhonchi  Abdomen: Soft, non tender, non distended, bowel sounds present, no guarding  Data Reviewed: Basic Metabolic Panel:  Recent Labs Lab 09/21/13 0500 09/22/13 0500 09/23/13 0525 09/24/13 0535 09/25/13 0700  NA 134* 135* 138 135* 129*  K 4.3 4.2 4.1 3.9 5.2  CL 102 100 103 99 94*  CO2 22 24 25 21 22   GLUCOSE 119* 115* 106* 99 506*  BUN 16 16 17 15 12   CREATININE 0.74 0.71 0.75 0.71 0.72  CALCIUM 8.6 8.8 9.0 8.7 8.7  MG  --  1.8 1.8 1.6 2.0  PHOS  --  4.6 4.5 3.9 5.6*   Liver Function Tests:  Recent Labs Lab 09/22/13 0500 09/25/13 0700  AST 12 19  ALT 13 10  ALKPHOS 53 54  BILITOT <0.2* <0.2*   PROT 5.9* 5.8*  ALBUMIN 2.5* 2.5*   CBC:  Recent Labs Lab 09/20/13 0520 09/22/13 0500  WBC 7.8 6.4  NEUTROABS  --  4.5  HGB 8.9* 9.0*  HCT 26.6* 26.6*  MCV 96.0 96.4  PLT 200 192   CBG:  Recent Labs Lab 09/20/13 0043 09/20/13 0559 09/20/13 1631 09/21/13 0014 09/21/13 0507  GLUCAP 112* 115* 104* 134* 121*    Scheduled Meds: . aspirin  325 mg Oral Daily  . enoxaparin (LOVENOX) injection  40 mg Subcutaneous QHS  . famotidine  20 mg Oral BID  . feeding supplement (RESOURCE BREEZE)  1 Container Oral TID BM  . FLUoxetine  20 mg Oral Daily  . metoprolol tartrate  25 mg Oral BID  . metronidazole  500 mg Intravenous Q8H  . simvastatin  20 mg Oral q1800  . vancomycin  250 mg Oral 4 times per day   Continuous Infusions: . sodium chloride 50 mL/hr at 09/24/13 1036  . Marland KitchenTPN (CLINIMIX-E) Adult 40 mL/hr at 09/24/13 1741   And  . fat emulsion 240 mL (09/24/13 1742)   Tanner Ramsay, MD  [facility] Pager [phone]  If 7PM-7AM, please contact night-coverage [email or web address] Password [email or web address] 09/25/2013, 1:29 PM   LOS: 15 days

## 2013-09-25 NOTE — Progress Notes (Signed)
I await further progress with therapy once rectal tube discontinued . As noted yesterday, pt prefers d/c home if therapy recommends at that time. [phone]

## 2013-09-25 NOTE — Progress Notes (Signed)
Urology Progress Note  Subjective:     No acute urologic events overnight.   Condom cath working well.   Tolerating full liquids.  Negative nausea.    ROS: Positive: RUE/RLE weakness- but improving.  Negative: Chest pain.  Objective:  Patient Vitals for the past 24 hrs:  BP Temp Temp src Pulse Resp SpO2 Weight  09/25/13 0947 148/90 mmHg - - 98 18 - -  09/25/13 0500 - - - - - - 74.844 kg (165 lb)  09/25/13 0429 139/86 mmHg 98.2 F (36.8 C) Oral 99 17 97 % -  09/24/13 2216 146/91 mmHg 98.8 F (37.1 C) Oral 98 18 98 % -    Physical Exam: General:  No acute distress, awake Cardiovascular:    [x]   S1/S2 present, Mildly tachycardic.  []   Irregularly irregular Chest:  CTA-B Abdomen:               []  Soft, appropriately TTP  [x]  Soft, Distended. No rebound TTP. Incisions c/d/i without erythema.  []  Soft, appropriately TTP, incision(s) clean/dry/intact  Genitourinary: Condom cath. Clear urine. Negative edema.     I/O last 3 completed shifts: In: 120 [P.O.:120] Out: 4040 [Urine:2600; Stool:1440]  No results found for this basename: HGB, WBC, PLT,  in the last 72 hours  Recent Labs     09/24/13  0535  09/25/13  0700  NA  135*  129*  K  3.9  5.2  CL  99  94*  CO2  21  22  BUN  15  12  CREATININE  0.71  0.72  CALCIUM  8.7  8.7  GFRNONAA  >90  >90  GFRAA  >90  >90     No results found for this basename: PT, INR, APTT,  in the last 72 hours   No components found with this basename: ABG,     Length of stay: 15 days.  Assessment: UTI. C. Diff colitis.  Robotic radical prostatectomy 08/29/13. Foley d/c'd 09/24/13.   Plan: -Continue conservative management.  -Nitrofurantoin complete.    Rolan Bucco, MD [phone]

## 2013-09-26 LAB — URINALYSIS, ROUTINE W REFLEX MICROSCOPIC
BILIRUBIN URINE: NEGATIVE
Glucose, UA: NEGATIVE mg/dL
KETONES UR: NEGATIVE mg/dL
NITRITE: NEGATIVE
Protein, ur: 100 mg/dL — AB
Specific Gravity, Urine: 1.016 (ref 1.005–1.030)
Urobilinogen, UA: 0.2 mg/dL (ref 0.0–1.0)
pH: 5.5 (ref 5.0–8.0)

## 2013-09-26 LAB — BASIC METABOLIC PANEL
BUN: 13 mg/dL (ref 6–23)
CHLORIDE: 102 meq/L (ref 96–112)
CO2: 23 meq/L (ref 19–32)
CREATININE: 0.76 mg/dL (ref 0.50–1.35)
Calcium: 8.7 mg/dL (ref 8.4–10.5)
GFR calc Af Amer: >90 mL/min (ref >90)
GLUCOSE: 87 mg/dL (ref 70–99)
POTASSIUM: 4.1 meq/L (ref 3.7–5.3)
SODIUM: 137 meq/L (ref 137–147)

## 2013-09-26 LAB — CBC
HCT: 26.6 % — ABNORMAL LOW (ref 39.0–52.0)
Hemoglobin: 9 g/dL — ABNORMAL LOW (ref 13.0–17.0)
MCH: 32.7 pg (ref 26.0–34.0)
MCHC: 33.8 g/dL (ref 30.0–36.0)
MCV: 96.7 fL (ref 78.0–100.0)
PLATELETS: 198 10*3/uL (ref 150–400)
RBC: 2.75 MIL/uL — AB (ref 4.22–5.81)
RDW: 13.8 % (ref 11.5–15.5)
WBC: 6.8 10*3/uL (ref 4.0–10.5)

## 2013-09-26 LAB — URINE MICROSCOPIC-ADD ON

## 2013-09-26 MED ORDER — METRONIDAZOLE 500 MG PO TABS
500.0000 mg | ORAL_TABLET | Freq: Three times a day (TID) | ORAL | Status: DC
  Administered 2013-09-26 – 2013-09-30 (×13): 500 mg via ORAL
  Filled 2013-09-26 (×15): qty 1

## 2013-09-26 MED ORDER — DEXTROSE 5 % IV SOLN
1.0000 g | INTRAVENOUS | Status: DC
  Administered 2013-09-26 – 2013-09-28 (×3): 1 g via INTRAVENOUS
  Filled 2013-09-26 (×4): qty 10

## 2013-09-26 NOTE — Progress Notes (Signed)
Patient ID: Tanner [name], male   DOB: [birth year], 58 y.o.   MRN: [id number]  [facility] PROGRESS NOTE  Dre Gamino [id number]:[id number] DOB: 1956-03-15 DOA: 09/10/2013 PCP: Criselda Peaches, MD  Brief narrative:  58 year old male with history of prostate cancer, hypertension, alcoholic hepatitis, status post radical robotic prostatectomy with bilateral pelvic lymph node dissection and lysis of colonic additions on 08/29/13, postop acute left corona radiata infarct with associated right hemiparesis on 09/02/13, transferred to inpatient rehabilitation on 09/05/13, developed nausea, vomiting, diarrhea and abdominal distention. GI was consulted and diagnosed with postop ileus, C. diff colitis, narcotics were stopped, NG tube and rectal tube were placed, started on IV Reglan, found to be hypokalemic and urine cultures grew Escherichia coli 55,000 colonies. Due to worsening condition, he was transferred to the hospital on 09/10/13 for further management.   Assessment/Plan:  SBO  - multifactorial and secondary to adhesions, immobility, C. Diff  - continued clinical improvement, tolerating clear liquid diet fairly well  - off TPN, plan to advance diet to regular  - NG out 3/12,  - continue supportive care with analgesia, antiemetics as needed  - rectal tube out as stool is more formed  - GI & surgery were following, signed off 3/15  Hypokalemia  - secondary to GI loses  - supplemented and now WNL this AM  - repeat BMP in AM  C. difficile colitis  - continue Flagyl and Vancomycin PO  Escherichia coli UTI versus colonization  - Patient was on day 7 of IV antibiotics initially - changed from Rocephin to cefazolin then stopped  - UA now with more leukocytes, will place on empiric Rocephin until urine culture is back  S/P Radical prostatectomy  Hypertension  - reasonable inpatient control  - continue Metoprolol PO  Anemia of chronic disease  - Hg and Hct stable over the past 48 hours  - repeat CBC in  AM  Recent stroke with right hemiparesis:  - PT inpatient  Incidental finding of right ICA aneurysm  - Will need followup with repeat imaging in one year.  Anxiety and depression  - Psychiatry input on 3/9 appreciated.  - Ativan PRN   Code Status: Full  Family Communication: Discussed with patient's sister  Disposition Plan: not ready for d/c, IR will not accept pt   Consultants:  Gastroenterology  General surgery  Urology Procedures:  Foley catheter  NG tube  Rectal tube Antibiotics:  IV cefazolin 3/3 > 3/4  IV Rocephin 3/4 > 3/5  IV cefazolin 3/5 > 3/10  HPI/Subjective: No events overnight.   Objective: Filed Vitals:   09/25/13 0947 09/25/13 1340 09/25/13 2009 09/26/13 0618  BP: 148/90 140/89 154/89 141/88  Pulse: 98 98 96 100  Temp:  98.6 F (37 C) 98.7 F (37.1 C) 98.9 F (37.2 C)  TempSrc:  Oral Oral Oral  Resp: 18 18 18 18   Height:      Weight:      SpO2:  97% 97% 96%    Intake/Output Summary (Last 24 hours) at 09/26/13 3557 Last data filed at 09/26/13 0150  Gross per 24 hour  Intake    330 ml  Output   2701 ml  Net  -2371 ml    Exam:   General:  Pt is alert, follows commands appropriately, not in acute distress  Cardiovascular: Regular rate and rhythm, S1/S2, no murmurs, no rubs, no gallops  Respiratory: Clear to auscultation bilaterally, no wheezing, no crackles, no rhonchi  Abdomen: Soft, non tender, non distended,  bowel sounds present, no guarding  Extremities: No edema, pulses DP and PT palpable bilaterally  Data Reviewed: Basic Metabolic Panel:  Recent Labs Lab 09/22/13 0500 09/23/13 0525 09/24/13 0535 09/25/13 0700 09/26/13 0420  NA 135* 138 135* 129* 137  K 4.2 4.1 3.9 5.2 4.1  CL 100 103 99 94* 102  CO2 24 25 21 22 23   GLUCOSE 115* 106* 99 506* 87  BUN 16 17 15 12 13   CREATININE 0.71 0.75 0.71 0.72 0.76  CALCIUM 8.8 9.0 8.7 8.7 8.7  MG 1.8 1.8 1.6 2.0  --   PHOS 4.6 4.5 3.9 5.6*  --    Liver Function  Tests:  Recent Labs Lab 09/22/13 0500 09/25/13 0700  AST 12 19  ALT 13 10  ALKPHOS 53 54  BILITOT <0.2* <0.2*  PROT 5.9* 5.8*  ALBUMIN 2.5* 2.5*   CBC:  Recent Labs Lab 09/20/13 0520 09/22/13 0500 09/26/13 0420  WBC 7.8 6.4 6.8  NEUTROABS  --  4.5  --   HGB 8.9* 9.0* 9.0*  HCT 26.6* 26.6* 26.6*  MCV 96.0 96.4 96.7  PLT 200 192 198  CBG:  Recent Labs Lab 09/20/13 0043 09/20/13 0559 09/20/13 1631 09/21/13 0014 09/21/13 0507  GLUCAP 112* 115* 104* 134* 121*   Scheduled Meds: . aspirin  325 mg Oral Daily  . enoxaparin (LOVENOX) injection  40 mg Subcutaneous QHS  . famotidine  20 mg Oral BID  . feeding supplement (RESOURCE BREEZE)  1 Container Oral TID BM  . FLUoxetine  20 mg Oral Daily  . metoprolol tartrate  25 mg Oral BID  . metronidazole  500 mg Intravenous Q8H  . simvastatin  20 mg Oral q1800  . vancomycin  250 mg Oral 4 times per day   Continuous Infusions:   Faye Ramsay, MD  [facility] Pager [phone]  If 7PM-7AM, please contact night-coverage [email or web address] Password [email or web address] 09/26/2013, 8:52 AM   LOS: 16 days

## 2013-09-26 NOTE — Progress Notes (Signed)
Urology Progress Note  Subjective:     No acute urologic events overnight.   Great night overnight. Negative abdominal pain or nausea. Rectal tube forced out by patient.    ROS: Positive: RUE/RLE weakness- but improving.  Negative: Chest pain.  Objective:  Patient Vitals for the past 24 hrs:  BP Temp Temp src Pulse Resp SpO2  09/26/13 0618 141/88 mmHg 98.9 F (37.2 C) Oral 100 18 96 %  09/25/13 2009 154/89 mmHg 98.7 F (37.1 C) Oral 96 18 97 %  09/25/13 1340 140/89 mmHg 98.6 F (37 C) Oral 98 18 97 %  09/25/13 0947 148/90 mmHg - - 98 18 -    Physical Exam: General:  No acute distress, awake Cardiovascular:    [x]   S1/S2 present, Mildly tachycardic.  []   Irregularly irregular Chest:  CTA-B Abdomen:               []  Soft, appropriately TTP  [x]  Soft, Distended. No rebound TTP. Incisions c/d/i without erythema.  []  Soft, appropriately TTP, incision(s) clean/dry/intact  Genitourinary: Condom cath. Clear urine. Negative edema.     I/O last 3 completed shifts: In: 61 [P.O.:570] Out: 3901 [Urine:3900; Stool:1]  Recent Labs     09/26/13  0420  HGB  9.0*  WBC  6.8  PLT  198    Recent Labs     09/25/13  0700  09/26/13  0420  NA  129*  137  K  5.2  4.1  CL  94*  102  CO2  22  23  BUN  12  13  CREATININE  0.72  0.76  CALCIUM  8.7  8.7  GFRNONAA  >90  >90  GFRAA  >90  >90     No results found for this basename: PT, INR, APTT,  in the last 72 hours   No components found with this basename: ABG,     Length of stay: 16 days.  Assessment: UTI. C. Diff colitis.  Robotic radical prostatectomy 08/29/13. Foley d/c'd 09/24/13.   Plan: -Doing well.  -Will sign off.  -F/u as scheduled w/ urology.    Rolan Bucco, MD [phone]

## 2013-09-26 NOTE — Progress Notes (Signed)
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 Now transfered back to acute for ileus vs SBO.   OT comments  Pt. Reports having a good night sleep and is agreeable to skilled o.t. Doing well with bed mobility.  Tolerating increased time eob but is still initially limited by c/o dizziness that takes some time to resolve.  Able to stand pivot to recliner for oob. Initial visible anxiety with oob. But agreeable with reassurance that he would be safe getting back to bed when he was ready.     Follow Up Recommendations  CIR           Equipment Recommendations  3 in 1 bedside comode        Frequency Min 3X/week   Progress towards OT Goals Progress towards OT goals: Progressing toward goals  Plan Discharge plan remains appropriate    Precautions / Restrictions Precautions Precautions: Fall Precaution Comments: foley and IV    Pertinent Vitals/Pain 2/10, pt. Requested meds from rn who was initially present at beg. Of session    ADL  Grooming: Performed;Teeth care;Set up Where Assessed - Grooming: Unsupported standing Upper Body Dressing: Performed;Min guard Where Assessed - Upper Body Dressing: Unsupported sitting Toilet Transfer: Simulated;Minimal assistance Toilet Transfer Method: Sit to stand;Stand pivot Toileting - Clothing Manipulation and Hygiene: Simulated;Minimal assistance Where Assessed - Toileting Clothing Manipulation and Hygiene: Standing Transfers/Ambulation Related to ADLs: sat EOB for grooming tasks, then agreeable to oob.  stand pivot to recliner ADL Comments: increased time for task completeion secondary to lingering  dizziness while in sitting. sim. bsc transfer eob to recliner.  encouraged pt. to request bsc while up in recliner vs. bed pan in bed.  pt. anxious wanting to know if everyone can "help with that" reviewed that all staff can assist with his level of transfer assistance.         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 Now transfered back to acute for ileus vs SBO.                 Cognition  Cognition Arousal/Alertness: Awake/alert Behavior During Therapy: Anxious Overall Cognitive Status: Within Functional Limits for tasks assessed    Mobility  Bed Mobility Overal bed mobility: Needs Assistance Bed Mobility: Rolling;Sidelying to Sit Rolling: Supervision Sidelying to sit: Supervision Supine to sit: Supervision General bed mobility comments: min guard to manage lines, min inst. cues for hand placement on bed rails Transfers Overall transfer level: Needs assistance Equipment used: 1 person hand held assist Transfers: Sit to/from Stand;Stand Pivot Transfers Sit to Stand: Min guard Stand pivot transfers: Min assist General transfer comment: min guard for inst. cues for hand placement and pivotal step initiatation for completing turn towards recliner              End of Session OT - End of Session Activity Tolerance: Patient tolerated treatment well Patient left: in chair;with call bell/phone within reach  Janice Coffin, COTA/L 09/26/2013, 9:29 AM

## 2013-09-26 NOTE — Progress Notes (Signed)
Pt wearing condom cath. Urine in bedside bag cloudy with sediment, strong odor. Dr. Doyle Askew notified. U/a, C&S ordered. Old cath and bag removed. Creamy drainage cleaned from meatus. New set up applied. Fresh urine obtained and sent to lab. Pt denies lower abd pain or burning with urination. He does have involuntary leaking of urine as well as small amt loose stool. We discussed bowel and bladder retraining and muscle development. He is eager to try.

## 2013-09-26 NOTE — Progress Notes (Signed)
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 Now transfered back to acute for ileus vs SBO.    PT Comments    Pt was still not able to leave his room today due to incontinence of stool upon standing (and remains on c diff precautions). Pt did well with RW and walking, however had difficulty with marching/step taps with loss of balance x 3. Noted pt wants to go home and feel he will be able to do so when medically cleared to discharge. Goals and d/c plan updated.   Follow Up Recommendations  Home health PT;Supervision/Assistance - 24 hour     Equipment Recommendations  None recommended by PT    Recommendations for Other Services      Precautions / Restrictions Precautions Precautions: Fall Precaution Comments: foley and IV     Mobility  Bed Mobility Overal bed mobility: Needs Assistance Bed Mobility: Rolling;Sidelying to Sit Rolling: Supervision Sidelying to sit: Supervision Supine to sit: Supervision     General bed mobility comments: min guard to manage lines, min inst. cues for hand placement on bed rails  Transfers Overall transfer level: Needs assistance Equipment used: Rolling walker (2 wheeled) Transfers: Sit to/from Stand Sit to Stand: Supervision Stand pivot transfers: Min assist       General transfer comment: pt stood from recliner x 3; heavy use of UEs, however no physical assist; continues to report dizziness with initial standing; practiced stand to sit without UE assist x 1 required min assist to control descent  Ambulation/Gait Ambulation/Gait assistance: Min guard Ambulation Distance (Feet): 105 Feet (in room) Assistive device: Rolling walker (2 wheeled) (pushing pole) Gait  Pattern/deviations: Step-through pattern;Decreased weight shift to right;Decreased stride length;Decreased stance time - right Gait velocity: decreased   General Gait Details: with standing, pt incontinent of small amount of stool, therefore did not leave the room with pt + cdiff. Dizziness improved with walking and no overt loss of balance when using RW/bil UE support. Pt with Rt knee hyperextension x1 and pt then able to prevent further hyperextension by maintaining slight knee flexion on Rt. Noted RLE became tremulous after walking 90 ft.    Stairs Stairs:  (unable due to incontinent stool/cdiff)          Wheelchair Mobility    Modified Rankin (Stroke Patients Only) Modified Rankin (Stroke Patients Only) Pre-Morbid Rankin Score: No symptoms Modified Rankin: Moderately severe disability     Balance     Sitting balance-Leahy Scale: Fair     Standing balance support: Bilateral upper extremity supported Standing balance-Leahy Scale: Poor Standing balance comment: dizziness with initial standing requiring support; then fatigues and requires support                     Cognition Arousal/Alertness: Awake/alert Behavior During Therapy: WFL for tasks assessed/performed Overall Cognitive Status: Within Functional Limits for tasks assessed                      Exercises General Exercises - Lower Extremity Hip Flexion/Marching: AROM;Right;5 reps;Standing Mini-Sqauts: AROM;Both;10 reps;Standing;Limitations Mini Squats Limitations: prior Rt ankle fracture limits ROM/depth of squats Other Exercises Other Exercises: Step taps onto overturned bath basin x 10 reps each leg (alternating) with bil UE support on RW; pt missed lifting  foot high enough to clear basin (~4" high) x 3 with min assist   General Comments        Pertinent Vitals/Pain Continues to report soreness around rectum with sitting; cream applied after pericare when incontinent    Home Living                       Prior Function            PT Goals (current goals can now be found in the care plan section) Acute Rehab PT Goals Patient Stated Goal: recover and get back to being independent PT Goal Formulation: With patient Time For Goal Achievement: 10/03/13 Potential to Achieve Goals: Good Progress towards PT goals: Progressing toward goals (goals updated due to timeframe)    Frequency  Min 4X/week    PT Plan Discharge plan needs to be updated    End of Session   Activity Tolerance: Patient tolerated treatment well;Other (comment) (limited to in room due to c diff) Patient left: with call bell/phone within reach;in chair     Time: 4098-1191 PT Time Calculation (min): 35 min  Charges:  $Gait Training: 8-22 mins $Therapeutic Exercise: 8-22 mins                    G Codes:      Stiles Maxcy 18-Oct-2013, 11:17 AM Pager [phone]

## 2013-09-26 NOTE — Progress Notes (Signed)
Patient has progressed with therapy to a level that he can go home with home health and assist of family when medically ready. Due to rectal tube, pt has been unable to sit up and has been bed bound since 09/11/13. I have discussed with RN CM and home health is now recommended, not readmission to inpt rehab. Please call me with any questions. [phone]

## 2013-09-27 LAB — CBC
HCT: 26.4 % — ABNORMAL LOW (ref 39.0–52.0)
HEMOGLOBIN: 8.7 g/dL — AB (ref 13.0–17.0)
MCH: 31.8 pg (ref 26.0–34.0)
MCHC: 33 g/dL (ref 30.0–36.0)
MCV: 96.4 fL (ref 78.0–100.0)
Platelets: 194 10*3/uL (ref 150–400)
RBC: 2.74 MIL/uL — AB (ref 4.22–5.81)
RDW: 13.8 % (ref 11.5–15.5)
WBC: 7.2 10*3/uL (ref 4.0–10.5)

## 2013-09-27 LAB — BASIC METABOLIC PANEL
BUN: 15 mg/dL (ref 6–23)
CALCIUM: 8.5 mg/dL (ref 8.4–10.5)
CO2: 24 meq/L (ref 19–32)
CREATININE: 0.83 mg/dL (ref 0.50–1.35)
Chloride: 101 mEq/L (ref 96–112)
GFR calc Af Amer: >90 mL/min (ref >90)
GLUCOSE: 84 mg/dL (ref 70–99)
Potassium: 3.8 mEq/L (ref 3.7–5.3)
SODIUM: 136 meq/L — AB (ref 137–147)

## 2013-09-27 NOTE — Progress Notes (Signed)
Patient ID: Tanner [name], male   DOB: July 12, 1955, 58 y.o.   MRN: [id number]  [facility] PROGRESS NOTE  Tanner [name] OXB:[id number] DOB: 1956/01/28 DOA: 09/10/2013 PCP: Criselda Peaches, MD  [facility] PROGRESS NOTE  Tanner [name] [id number] DOB: 13-Mar-1956 DOA: 09/10/2013 PCP: Criselda Peaches, MD  Brief narrative:  58 year old male with history of prostate cancer, hypertension, alcoholic hepatitis, status post radical robotic prostatectomy with bilateral pelvic lymph node dissection and lysis of colonic additions on 08/29/13, postop acute left corona radiata infarct with associated right hemiparesis on 09/02/13, transferred to inpatient rehabilitation on 09/05/13, developed nausea, vomiting, diarrhea and abdominal distention. GI was consulted and diagnosed with postop ileus, C. diff colitis, narcotics were stopped, NG tube and rectal tube were placed, started on IV Reglan, found to be hypokalemic and urine cultures grew Escherichia coli 55,000 colonies. Due to worsening condition, he was transferred to the hospital on 09/10/13 for further management.   Assessment/Plan:  SBO  - multifactorial and secondary to adhesions, immobility, C. Diff  - continued clinical improvement, tolerating regular diet well  - off TPN since 3/19 - NG out 3/12,  - continue supportive care with analgesia, antiemetics as needed  - rectal tube out 3/20 as stool is more formed  - GI & surgery were following, signed off 3/15  Hypokalemia  - secondary to GI loses  - supplemented and now WNL this AM  C. difficile colitis  - continue Flagyl and Vancomycin PO  Escherichia coli UTI versus colonization  - Patient was on day 7 of IV antibiotics initially  - changed from Rocephin to cefazolin then stopped  - UA now with more leukocytes 3/20, placed on empiric Rocephin until urine culture is back  S/P Radical prostatectomy  Hypertension  - reasonable inpatient control  - continue Metoprolol PO  Anemia of  chronic disease  - Hg and Hct stable over the past 48 hours  - repeat CBC in AM  Recent stroke with right hemiparesis:  - PT inpatient  Incidental finding of right ICA aneurysm  - Will need followup with repeat imaging in one year.  Anxiety and depression  - Psychiatry input on 3/9 appreciated.  - Ativan PRN   Code Status: Full  Family Communication: Discussed with patient's sister  Disposition Plan: plan for d/c home with HHPT, RN once pt medically stable   Consultants:  Gastroenterology  General surgery  Urology Procedures:  Foley catheter  NG tube  Rectal tube Antibiotics:  IV cefazolin 3/3 > 3/4  IV Rocephin 3/4 > 3/5  IV cefazolin 3/5 > 3/10  HPI/Subjective: No events overnight.   Objective: Filed Vitals:   09/26/13 1045 09/26/13 1402 09/26/13 2014 09/27/13 0438  BP: 125/88 124/80 147/93 135/86  Pulse: 129 100 97 94  Temp:  98.7 F (37.1 C) 98.3 F (36.8 C) 98.4 F (36.9 C)  TempSrc:  Oral Oral Oral  Resp:  18 18 18   Height:      Weight:      SpO2:  97% 97% 96%    Intake/Output Summary (Last 24 hours) at 09/27/13 1359 Last data filed at 09/27/13 0439  Gross per 24 hour  Intake      0 ml  Output   1600 ml  Net  -1600 ml    Exam:   General:  Pt is alert, follows commands appropriately, not in acute distress  Cardiovascular: Regular rate and rhythm, S1/S2, no murmurs, no rubs, no gallops  Respiratory: Clear to auscultation bilaterally, no wheezing,  no crackles, no rhonchi  Abdomen: Soft, non tender, non distended, bowel sounds present, no guarding  Extremities: No edema, pulses DP and PT palpable bilaterally  Neuro: Grossly nonfocal  Data Reviewed: Basic Metabolic Panel:  Recent Labs Lab 09/22/13 0500 09/23/13 0525 09/24/13 0535 09/25/13 0700 09/26/13 0420 09/27/13 0515  NA 135* 138 135* 129* 137 136*  K 4.2 4.1 3.9 5.2 4.1 3.8  CL 100 103 99 94* 102 101  CO2 24 25 21 22 23 24   GLUCOSE 115* 106* 99 506* 87 84  BUN 16 17 15 12 13  15   CREATININE 0.71 0.75 0.71 0.72 0.76 0.83  CALCIUM 8.8 9.0 8.7 8.7 8.7 8.5  MG 1.8 1.8 1.6 2.0  --   --   PHOS 4.6 4.5 3.9 5.6*  --   --    Liver Function Tests:  Recent Labs Lab 09/22/13 0500 09/25/13 0700  AST 12 19  ALT 13 10  ALKPHOS 53 54  BILITOT <0.2* <0.2*  PROT 5.9* 5.8*  ALBUMIN 2.5* 2.5*   No results found for this basename: LIPASE, AMYLASE,  in the last 168 hours No results found for this basename: AMMONIA,  in the last 168 hours CBC:  Recent Labs Lab 09/22/13 0500 09/26/13 0420 09/27/13 0515  WBC 6.4 6.8 7.2  NEUTROABS 4.5  --   --   HGB 9.0* 9.0* 8.7*  HCT 26.6* 26.6* 26.4*  MCV 96.4 96.7 96.4  PLT 192 198 194   Cardiac Enzymes: No results found for this basename: CKTOTAL, CKMB, CKMBINDEX, TROPONINI,  in the last 168 hours BNP: No components found with this basename: POCBNP,  CBG:  Recent Labs Lab 09/20/13 1631 09/21/13 0014 09/21/13 0507  GLUCAP 104* 134* 121*    No results found for this or any previous visit (from the past 240 hour(s)).   Scheduled Meds: . aspirin  325 mg Oral Daily  . cefTRIAXone (ROCEPHIN)  IV  1 g Intravenous Q24H  . enoxaparin (LOVENOX) injection  40 mg Subcutaneous QHS  . famotidine  20 mg Oral BID  . feeding supplement (RESOURCE BREEZE)  1 Container Oral TID BM  . FLUoxetine  20 mg Oral Daily  . metoprolol tartrate  25 mg Oral BID  . metroNIDAZOLE  500 mg Oral 3 times per day  . simvastatin  20 mg Oral q1800  . vancomycin  250 mg Oral 4 times per day   Continuous Infusions:    Faye Ramsay, MD  [facility] Pager [phone]  If 7PM-7AM, please contact night-coverage [email or web address] Password [facility] 09/27/2013, 1:59 PM   LOS: 17 days       HPI/Subjective: No events overnight.   Objective: Filed Vitals:   09/26/13 1045 09/26/13 1402 09/26/13 2014 09/27/13 0438  BP: 125/88 124/80 147/93 135/86  Pulse: 129 100 97 94  Temp:  98.7 F (37.1 C) 98.3 F (36.8 C) 98.4 F (36.9 C)  TempSrc:  Oral Oral  Oral  Resp:  18 18 18   Height:      Weight:      SpO2:  97% 97% 96%    Intake/Output Summary (Last 24 hours) at 09/27/13 1358 Last data filed at 09/27/13 0439  Gross per 24 hour  Intake      0 ml  Output   1600 ml  Net  -1600 ml    Exam:   General:  Pt is alert, follows commands appropriately, not in acute distress  Cardiovascular: Regular rate and rhythm, S1/S2, no murmurs, no rubs, no gallops  Respiratory: Clear to auscultation bilaterally, no wheezing, no crackles, no rhonchi  Abdomen: Soft, non tender, non distended, bowel sounds present, no guarding  Extremities: No edema, pulses DP and PT palpable bilaterally  Data Reviewed: Basic Metabolic Panel:  Recent Labs Lab 09/22/13 0500 09/23/13 0525 09/24/13 0535 09/25/13 0700 09/26/13 0420 09/27/13 0515  NA 135* 138 135* 129* 137 136*  K 4.2 4.1 3.9 5.2 4.1 3.8  CL 100 103 99 94* 102 101  CO2 24 25 21 22 23 24   GLUCOSE 115* 106* 99 506* 87 84  BUN 16 17 15 12 13 15   CREATININE 0.71 0.75 0.71 0.72 0.76 0.83  CALCIUM 8.8 9.0 8.7 8.7 8.7 8.5  MG 1.8 1.8 1.6 2.0  --   --   PHOS 4.6 4.5 3.9 5.6*  --   --    Liver Function Tests:  Recent Labs Lab 09/22/13 0500 09/25/13 0700  AST 12 19  ALT 13 10  ALKPHOS 53 54  BILITOT <0.2* <0.2*  PROT 5.9* 5.8*  ALBUMIN 2.5* 2.5*   CBC:  Recent Labs Lab 09/22/13 0500 09/26/13 0420 09/27/13 0515  WBC 6.4 6.8 7.2  NEUTROABS 4.5  --   --   HGB 9.0* 9.0* 8.7*  HCT 26.6* 26.6* 26.4*  MCV 96.4 96.7 96.4  PLT 192 198 194   CBG:  Recent Labs Lab 09/20/13 1631 09/21/13 0014 09/21/13 0507  GLUCAP 104* 134* 121*   Scheduled Meds: . aspirin  325 mg Oral Daily  . cefTRIAXone (ROCEPHIN)  IV  1 g Intravenous Q24H  . enoxaparin (LOVENOX) injection  40 mg Subcutaneous QHS  . famotidine  20 mg Oral BID  . feeding supplement (RESOURCE BREEZE)  1 Container Oral TID BM  . FLUoxetine  20 mg Oral Daily  . metoprolol tartrate  25 mg Oral BID  . metroNIDAZOLE  500 mg  Oral 3 times per day  . simvastatin  20 mg Oral q1800  . vancomycin  250 mg Oral 4 times per day   Continuous Infusions:  Faye Ramsay, MD  [facility] Pager [phone]  If 7PM-7AM, please contact night-coverage [email or web address] Password [email or web address] 09/27/2013, 1:58 PM   LOS: 17 days

## 2013-09-27 NOTE — Progress Notes (Signed)
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 Now transfered back to acute for ileus vs SBO.    PT Comments    Patient continue to progress with mobility.  Main limiting factor appears to be dizziness when up.   Patient requiring only supervision for most mobility today.  Will benefit from continued PT in hospital and at home with [place].    Follow Up Recommendations  Home health PT;Supervision - Intermittent     Equipment Recommendations  None recommended by PT    Recommendations for Other Services      Precautions / Restrictions Precautions Precautions: Fall Precaution Comments: foley    Mobility  Bed Mobility         Supine to sit: Modified independent (Device/Increase time) Sit to supine: Supervision      Transfers Overall transfer level: Needs assistance Equipment used: Rolling walker (2 wheeled) Transfers: Sit to/from Stand Sit to Stand: Supervision            Ambulation/Gait Ambulation/Gait assistance: Supervision Ambulation Distance (Feet): 150 Feet Assistive device: Rolling walker (2 wheeled) Gait Pattern/deviations: Step-through pattern;Decreased stride length Gait velocity: decreased Gait velocity interpretation: Below normal speed for age/gender General Gait Details: patient reporting dizziness while ambulating, no apparent loss of balance.  Leaning on RW   Stairs            Wheelchair Mobility    Modified Rankin (Stroke Patients Only) Modified Rankin (Stroke Patients Only) Pre-Morbid Rankin Score: No symptoms Modified Rankin: Moderately severe disability     Balance           Standing balance support: Bilateral upper extremity supported Standing balance-Leahy Scale:  Poor Standing balance comment: uses RW                    Cognition Arousal/Alertness: Awake/alert Behavior During Therapy: WFL for tasks assessed/performed Overall Cognitive Status: Within Functional Limits for tasks assessed                      Exercises    General Comments        Pertinent Vitals/Pain Denies pain     Home Living                      Prior Function            PT Goals (current goals can now be found in the care plan section) Progress towards PT goals: Progressing toward goals    Frequency  Min 4X/week    PT Plan Current plan remains appropriate    End of Session Equipment Utilized During Treatment: Gait belt Activity Tolerance: Patient tolerated treatment well Patient left: in bed;with call bell/phone within reach     Time: 1346-1407 PT Time Calculation (min): 21 min  Charges:  $Gait Training: 8-22 mins                    G CodesShanna Cisco, [place] 09/27/2013, 2:13 PM

## 2013-09-28 NOTE — Progress Notes (Signed)
Patient ID: Tanner [name], male   DOB: Apr 14, 1956, 58 y.o.   MRN: [id number]  [facility] PROGRESS NOTE  Floyd Wade [id number] DOB: May 31, 1956 DOA: 09/10/2013 PCP: Criselda Peaches, MD  Brief narrative:  58 year old male with history of prostate cancer, hypertension, alcoholic hepatitis, status post radical robotic prostatectomy with bilateral pelvic lymph node dissection and lysis of colonic additions on 08/29/13, postop acute left corona radiata infarct with associated right hemiparesis on 09/02/13, transferred to inpatient rehabilitation on 09/05/13, developed nausea, vomiting, diarrhea and abdominal distention. GI was consulted and diagnosed with postop ileus, C. diff colitis, narcotics were stopped, NG tube and rectal tube were placed, started on IV Reglan, found to be hypokalemic and urine cultures grew Escherichia coli 55,000 colonies. Due to worsening condition, he was transferred to the hospital on 09/10/13 for further management.   Assessment/Plan:  SBO  - multifactorial and secondary to adhesions, immobility, C. Diff  - continued clinical improvement, tolerating regular diet well  - off TPN since 3/19  - NG out 3/12,  - continue supportive care with analgesia, antiemetics as needed  - rectal tube out 3/20 as stool is more formed  - GI & surgery were following, signed off 3/15  Hypokalemia  - secondary to GI loses  - supplemented and remains WNL C. difficile colitis  - continue Flagyl and Vancomycin PO  Escherichia coli UTI versus colonization  - Patient was on day 7 of IV antibiotics initially  - changed from Rocephin to cefazolin then stopped  - UA now with more leukocytes 3/20, placed on empiric Rocephin until urine culture is back  S/P Radical prostatectomy  Hypertension  - reasonable inpatient control  - continue Metoprolol PO  Anemia of chronic disease  - Hg and Hct stable over the past 48 hours  Recent stroke with right hemiparesis:  - PT inpatient  Incidental  finding of right ICA aneurysm  - Will need followup with repeat imaging in one year.  Anxiety and depression  - Psychiatry input on 3/9 appreciated.  - Ativan PRN   Code Status: Full  Family Communication: Discussed with patient's sister  Disposition Plan: plan for d/c home with HHPT, RN once pt medically stable   Consultants:  Gastroenterology  General surgery  Urology Procedures:  Foley catheter  NG tube  Rectal tube Antibiotics:  IV cefazolin 3/3 > 3/4  IV Rocephin 3/4 > 3/5  IV cefazolin 3/5 > 3/10  HPI/Subjective: No events overnight.   Objective: Filed Vitals:   09/27/13 0438 09/27/13 1700 09/27/13 2046 09/28/13 0538  BP: 135/86 131/98 149/93 154/95  Pulse: 94 102 98 95  Temp: 98.4 F (36.9 C) 98.2 F (36.8 C) 98.7 F (37.1 C) 98.2 F (36.8 C)  TempSrc: Oral Oral Oral Oral  Resp: 18 18 18 18   Height:    5\' 10"  (1.778 m)  Weight:    74.844 kg (165 lb)  SpO2: 96% 98% 97% 97%    Intake/Output Summary (Last 24 hours) at 09/28/13 0759 Last data filed at 09/28/13 0541  Gross per 24 hour  Intake    837 ml  Output   1900 ml  Net  -1063 ml    Exam:   General:  Pt is alert, follows commands appropriately, not in acute distress  Cardiovascular: Regular rate and rhythm, S1/S2, no murmurs, no rubs, no gallops  Respiratory: Clear to auscultation bilaterally, no wheezing, no crackles, no rhonchi  Abdomen: Soft, non tender, non distended, bowel sounds present, no guarding   Data  Reviewed: Basic Metabolic Panel:  Recent Labs Lab 09/22/13 0500 09/23/13 0525 09/24/13 0535 09/25/13 0700 09/26/13 0420 09/27/13 0515  NA 135* 138 135* 129* 137 136*  K 4.2 4.1 3.9 5.2 4.1 3.8  CL 100 103 99 94* 102 101  CO2 24 25 21 22 23 24   GLUCOSE 115* 106* 99 506* 87 84  BUN 16 17 15 12 13 15   CREATININE 0.71 0.75 0.71 0.72 0.76 0.83  CALCIUM 8.8 9.0 8.7 8.7 8.7 8.5  MG 1.8 1.8 1.6 2.0  --   --   PHOS 4.6 4.5 3.9 5.6*  --   --    Liver Function Tests:  Recent  Labs Lab 09/22/13 0500 09/25/13 0700  AST 12 19  ALT 13 10  ALKPHOS 53 54  BILITOT <0.2* <0.2*  PROT 5.9* 5.8*  ALBUMIN 2.5* 2.5*   No results found for this basename: LIPASE, AMYLASE,  in the last 168 hours No results found for this basename: AMMONIA,  in the last 168 hours CBC:  Recent Labs Lab 09/22/13 0500 09/26/13 0420 09/27/13 0515  WBC 6.4 6.8 7.2  NEUTROABS 4.5  --   --   HGB 9.0* 9.0* 8.7*  HCT 26.6* 26.6* 26.4*  MCV 96.4 96.7 96.4  PLT 192 198 194   Recent Results (from the past 240 hour(s))  URINE CULTURE     Status: None   Collection Time    09/26/13  2:57 PM      Result Value Ref Range Status   Specimen Description URINE, CLEAN CATCH   Final   Special Requests NONE   Final   Culture  Setup Time     Final   Value: 09/26/2013 20:05     Performed at [place] PENDING   Incomplete   Culture     Final   Value: Culture reincubated for better growth     Performed at [facility]   Report Status PENDING   Incomplete     Scheduled Meds: . aspirin  325 mg Oral Daily  . cefTRIAXone (ROCEPHIN)  IV  1 g Intravenous Q24H  . enoxaparin (LOVENOX) injection  40 mg Subcutaneous QHS  . famotidine  20 mg Oral BID  . feeding supplement (RESOURCE BREEZE)  1 Container Oral TID BM  . FLUoxetine  20 mg Oral Daily  . metoprolol tartrate  25 mg Oral BID  . metroNIDAZOLE  500 mg Oral 3 times per day  . simvastatin  20 mg Oral q1800  . vancomycin  250 mg Oral 4 times per day   Continuous Infusions:    Faye Ramsay, MD  [facility] Pager [phone]  If 7PM-7AM, please contact night-coverage [email or web address] Password [email or web address] 09/28/2013, 7:59 AM   LOS: 18 days

## 2013-09-29 LAB — URINE CULTURE

## 2013-09-29 LAB — CBC
HCT: 25.9 % — ABNORMAL LOW (ref 39.0–52.0)
Hemoglobin: 8.7 g/dL — ABNORMAL LOW (ref 13.0–17.0)
MCH: 32.3 pg (ref 26.0–34.0)
MCHC: 33.6 g/dL (ref 30.0–36.0)
MCV: 96.3 fL (ref 78.0–100.0)
PLATELETS: 198 10*3/uL (ref 150–400)
RBC: 2.69 MIL/uL — AB (ref 4.22–5.81)
RDW: 13.6 % (ref 11.5–15.5)
WBC: 6 10*3/uL (ref 4.0–10.5)

## 2013-09-29 LAB — BASIC METABOLIC PANEL
BUN: 14 mg/dL (ref 6–23)
CALCIUM: 8.6 mg/dL (ref 8.4–10.5)
CO2: 23 meq/L (ref 19–32)
CREATININE: 0.8 mg/dL (ref 0.50–1.35)
Chloride: 101 mEq/L (ref 96–112)
GFR calc Af Amer: >90 mL/min (ref >90)
Glucose, Bld: 91 mg/dL (ref 70–99)
Potassium: 3.5 mEq/L — ABNORMAL LOW (ref 3.7–5.3)
Sodium: 137 mEq/L (ref 137–147)

## 2013-09-29 MED ORDER — POTASSIUM CHLORIDE CRYS ER 20 MEQ PO TBCR
40.0000 meq | EXTENDED_RELEASE_TABLET | Freq: Once | ORAL | Status: AC
  Administered 2013-09-29: 40 meq via ORAL
  Filled 2013-09-29: qty 2

## 2013-09-29 MED ORDER — CIPROFLOXACIN HCL 500 MG PO TABS
500.0000 mg | ORAL_TABLET | Freq: Two times a day (BID) | ORAL | Status: DC
  Administered 2013-09-29 – 2013-09-30 (×2): 500 mg via ORAL
  Filled 2013-09-29 (×4): qty 1

## 2013-09-29 NOTE — Progress Notes (Signed)
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 Now transfered back to acute for ileus vs SBO.   OT comments  This 58 yo making progress. He still reports dizziness with supine to sit, sit to stand, while up, while seated and talking to me, when I ask him to turn his lead left and right 3 times fast (no nystagmus noted); BP did drop see vitals below--made PT aware. Recommend HHOT post D/C since plan is now home. Eating goal is met without AE, grooming goal is met at a set-up/S level, based on the way he is moving with me is UBB/D would be at a setup/S level as well, toilet transfer and toileting clothing/manipulation goals is met at a S level with RW. Pt needs to continue to work on RUE (shoulder and endurance activities) as well as I will add LBB/D goals since pt is struggling with this (and he said this was even pta).  Follow Up Recommendations  Home health OT       Equipment Recommendations   (tub seat that acute OT will provide)       Frequency Min 3X/week   Progress towards OT Goals Progress towards OT goals: Progressing toward goals  Plan Discharge plan needs to be updated    Precautions / Restrictions Precautions Precautions: Fall Precaution Comments: foley, picc line RUE Restrictions Weight Bearing Restrictions: No       ADL  Grooming: Teeth care;Supervision/safety;Set up Where Assessed - Grooming: Unsupported standing Toilet Transfer: Copy Method: Sit to Loss adjuster, chartered: Therapist, occupational and Hygiene: Supervision/safety (with Soil scientist) Where Assessed - Toileting Clothing Manipulation and Hygiene: Sit to stand from 3-in-1 or toilet Equipment Used: Gait belt;Rolling walker Transfers/Ambulation Related to ADLs: S with RW and verbal cues for safe hand placement      OT Goals(current goals can now be found in the care plan section)    Visit Information  Last OT Received On: 09/29/13 Assistance Needed: +1 History of Present Illness: 58 yo male s/p prostatectomy 2/20 for malignant neoplasm and 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 Now transfered back to acute for ileus vs SBO.          Cognition  Cognition Arousal/Alertness: Awake/alert Behavior During Therapy: WFL for tasks assessed/performed Overall Cognitive Status: Within Functional Limits for tasks assessed    Mobility  Bed Mobility Overal bed mobility: Modified Independent Bed Mobility: Supine to Sit Transfers Overall transfer level: Needs assistance Transfers: Sit to/from Stand Sit to Stand: Supervision General transfer comment: VCs for safe hand placement    Exercises  Other Exercises Other Exercises: Pt reports that his RUE feels back to about 75% of normal with mainly limitations noted in shoulder ROM (tight and stiff, but can achieve full shoulder flexion with increased time and effort), he feels like his arm fatigues quickly. He is using it functionally   Balance Balance Overall balance assessment: Needs assistance Sitting-balance support: No upper extremity supported;Feet supported Sitting balance-Leahy Scale: Good Standing balance support: Single extremity supported Standing balance-Leahy Scale: Fair  End of Session OT - End of Session Equipment Utilized During Treatment: Gait belt;Rolling walker  Activity Tolerance: Patient tolerated treatment well Patient left: in chair;with call bell/phone within reach Nurse Communication:  (BP levels and questioned about giving him BP  meds since may drop him lower than he already is from his "normal" per his report)       Almon Register [phone] 09/29/2013, 10:38 AM

## 2013-09-29 NOTE — Progress Notes (Signed)
Patient ID: Tanner [name], male   DOB: [birth year], 58 y.o.   MRN: [id number]  [facility] PROGRESS NOTE  Merlen Gurry VEL:[id number] DOB: 01-23-1956 DOA: 09/10/2013 PCP: Criselda Peaches, MD  Brief narrative:  58 year old male with history of prostate cancer, hypertension, alcoholic hepatitis, status post radical robotic prostatectomy with bilateral pelvic lymph node dissection and lysis of colonic additions on 08/29/13, postop acute left corona radiata infarct with associated right hemiparesis on 09/02/13, transferred to inpatient rehabilitation on 09/05/13, developed nausea, vomiting, diarrhea and abdominal distention. GI was consulted and diagnosed with postop ileus, C. diff colitis, narcotics were stopped, NG tube and rectal tube were placed, started on IV Reglan, found to be hypokalemic and urine cultures grew Escherichia coli 55,000 colonies. Due to worsening condition, he was transferred to the hospital on 09/10/13 for further management.   Assessment/Plan:  SBO  - multifactorial and secondary to adhesions, immobility, C. Diff  - continued clinical improvement, tolerating regular diet well  - off TPN since 3/19  - NG out 3/12,  - continue supportive care with analgesia, antiemetics as needed  - rectal tube out 3/20 as stool is more formed  - GI & surgery were following, signed off 3/15  Hypokalemia  - secondary to GI loses  - slightly low this AM, will supplement and repeat BMP in AM C. difficile colitis  - continue Flagyl and Vancomycin PO  Escherichia coli UTI versus colonization  - Patient was on day 7 of IV antibiotics initially  - changed from Rocephin to cefazolin then stopped  - UA now with more leukocytes 3/20, placed on empiric Rocephin but urine culture is positive for Pseudomonas sensitive to Cipro - will change Rocephin to Cipro  S/P Radical prostatectomy  Hypertension  - reasonable inpatient control  - continue Metoprolol PO  Anemia of chronic disease  - Hg and Hct  stable over the past 48 hours  Recent stroke with right hemiparesis:  - PT inpatient  Incidental finding of right ICA aneurysm  - Will need followup with repeat imaging in one year.  Anxiety and depression  - Psychiatry input on 3/9 appreciated.  - Ativan PRN   Code Status: Full  Family Communication: Discussed with patient's sister  Disposition Plan: plan for d/c home with HHPT, RN once pt medically stable   Consultants:  Gastroenterology  General surgery  Urology Procedures:  Foley catheter  NG tube  Rectal tube Antibiotics:  IV cefazolin 3/3 > 3/4  IV Rocephin 3/4 > 3/5, 3/21 --> 3/23  IV cefazolin 3/5 > 3/10 Cipro 3/23 -->  HPI/Subjective: No events overnight.   Objective: Filed Vitals:   09/29/13 0843 09/29/13 0846 09/29/13 0930 09/29/13 1034  BP: 122/88 112/82 126/86 137/88  Pulse:    99  Temp:      TempSrc:      Resp:      Height:      Weight:      SpO2:        Intake/Output Summary (Last 24 hours) at 09/29/13 1648 Last data filed at 09/29/13 1300  Gross per 24 hour  Intake    240 ml  Output   2000 ml  Net  -1760 ml    Exam:   General:  Pt is alert, follows commands appropriately, not in acute distress  Cardiovascular: Regular rate and rhythm, S1/S2, no murmurs, no rubs, no gallops  Respiratory: Clear to auscultation bilaterally, no wheezing, no crackles, no rhonchi  Abdomen: Soft, non tender, non distended, bowel sounds  present, no guarding  Extremities: No edema, pulses DP and PT palpable bilaterally  Neuro: Grossly nonfocal  Data Reviewed: Basic Metabolic Panel:  Recent Labs Lab 09/23/13 0525 09/24/13 0535 09/25/13 0700 09/26/13 0420 09/27/13 0515 09/29/13 0424  NA 138 135* 129* 137 136* 137  K 4.1 3.9 5.2 4.1 3.8 3.5*  CL 103 99 94* 102 101 101  CO2 25 21 22 23 24 23   GLUCOSE 106* 99 506* 87 84 91  BUN 17 15 12 13 15 14   CREATININE 0.75 0.71 0.72 0.76 0.83 0.80  CALCIUM 9.0 8.7 8.7 8.7 8.5 8.6  MG 1.8 1.6 2.0  --   --    --   PHOS 4.5 3.9 5.6*  --   --   --    Liver Function Tests:  Recent Labs Lab 09/25/13 0700  AST 19  ALT 10  ALKPHOS 54  BILITOT <0.2*  PROT 5.8*  ALBUMIN 2.5*   No results found for this basename: LIPASE, AMYLASE,  in the last 168 hours No results found for this basename: AMMONIA,  in the last 168 hours CBC:  Recent Labs Lab 09/26/13 0420 09/27/13 0515 09/29/13 0424  WBC 6.8 7.2 6.0  HGB 9.0* 8.7* 8.7*  HCT 26.6* 26.4* 25.9*  MCV 96.7 96.4 96.3  PLT 198 194 198   Recent Results (from the past 240 hour(s))  URINE CULTURE     Status: None   Collection Time    09/26/13  2:57 PM      Result Value Ref Range Status   Specimen Description URINE, CLEAN CATCH   Final   Special Requests NONE   Final   Culture  Setup Time     Final   Value: 09/26/2013 20:05     Performed at [place]     Final   Value: 85,000 COLONIES/ML     Performed at [facility]   Culture     Final   Value: PSEUDOMONAS AERUGINOSA     Note: Two isolates with different morphologies were identified as the same organism.The most resistant organism was reported.     Performed at [facility]   Report Status 09/29/2013 FINAL   Final   Organism ID, Bacteria PSEUDOMONAS AERUGINOSA   Final     Scheduled Meds: . aspirin  325 mg Oral Daily  . ciprofloxacin  500 mg Oral BID  . enoxaparin (LOVENOX) injection  40 mg Subcutaneous QHS  . famotidine  20 mg Oral BID  . feeding supplement (RESOURCE BREEZE)  1 Container Oral TID BM  . FLUoxetine  20 mg Oral Daily  . metoprolol tartrate  25 mg Oral BID  . metroNIDAZOLE  500 mg Oral 3 times per day  . simvastatin  20 mg Oral q1800  . vancomycin  250 mg Oral 4 times per day   Continuous Infusions:   Faye Ramsay, MD  [facility] Pager [phone]  If 7PM-7AM, please contact night-coverage [email or web address] Password [facility] 09/29/2013, 4:48 PM   LOS: 19 days

## 2013-09-29 NOTE — Progress Notes (Signed)
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 Now transfered back to acute for ileus vs SBO.   PT Comments   Pt limited in participation by orthostasis with supine to sit to stand. (see vitals doc flowsheet) Pt symptomatic with dizziness/"disorientation" and nausea. RN made aware and in to provide medication. Pt has yet to practice up/down steps for entry into his home. Will attempt again 3/24 a.m.    Follow Up Recommendations  Home health PT;Supervision - Intermittent     Does the patient have the potential to tolerate intense rehabilitation     Barriers to Discharge        Equipment Recommendations  None recommended by PT    Recommendations for Other Services    Frequency Min 4X/week   Progress towards PT Goals Progress towards PT goals: Not progressing toward goals - comment (orthostasis)  Plan Current plan remains appropriate    Precautions / Restrictions Precautions Precautions: Fall Precaution Comments: foley, picc line RUE   Pertinent Vitals/Pain See vitals flow sheet.     Mobility  Bed Mobility Overal bed mobility: Modified Independent Bed Mobility: Supine to Sit Supine to sit: Modified independent (Device/Increase time) General bed mobility comments: incr time for safety on air mattress Transfers Overall transfer level: Needs assistance Equipment used: Rolling walker (2 wheeled) Transfers: Sit to/from Stand Sit to Stand: Supervision General transfer comment: supervision due to orthostasis with dizziness and nausea Ambulation/Gait General Gait Details: unable due to orthostasis Modified Rankin (Stroke Patients  Only) Pre-Morbid Rankin Score: No symptoms Modified Rankin: Moderately severe disability    Exercises     PT Diagnosis:    PT Problem List:   PT Treatment Interventions:     PT Goals (current goals can now be found in the care plan section) Acute Rehab PT Goals Patient Stated Goal: recover and get back to being independent PT Goal Formulation: With patient Time For Goal Achievement: 10/03/13 Potential to Achieve Goals: Good  Visit Information  Last PT Received On: 09/29/13 Assistance Needed: +1 History of Present Illness: 58 yo male s/p prostatectomy 2/20 for malignant neoplasm and 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 Now transfered back to acute for ileus vs SBO.    Subjective Data  Patient Stated Goal: recover and get back to being independent   Cognition  Cognition Arousal/Alertness: Awake/alert Behavior During Therapy: WFL for tasks assessed/performed Overall Cognitive Status: Within Functional Limits for tasks assessed    Balance  Balance Sitting balance-Leahy Scale: Fair Standing balance support: Bilateral upper extremity supported (RW) Standing balance-Leahy Scale: Poor General Comments General comments (skin integrity, edema, etc.): Noted pt hypokalemic (3.5) and due to take potassium. Hopeful this will correct his orthostasis  End of Session PT - End of Session Activity Tolerance: Treatment limited secondary to medical complications (Comment) (orthostasis and nausea) Patient left: with call bell/phone within reach;in bed;with nursing/sitter in room Nurse Communication: Mobility status;Other (comment) (orthostasis)   GP     Tylicia Sherman 09/29/2013, 5:17 PM Pager [phone]

## 2013-09-30 LAB — BASIC METABOLIC PANEL
BUN: 15 mg/dL (ref 6–23)
CO2: 23 meq/L (ref 19–32)
CREATININE: 0.79 mg/dL (ref 0.50–1.35)
Calcium: 8.6 mg/dL (ref 8.4–10.5)
Chloride: 102 mEq/L (ref 96–112)
GFR calc non Af Amer: >90 mL/min (ref >90)
Glucose, Bld: 86 mg/dL (ref 70–99)
Potassium: 4 mEq/L (ref 3.7–5.3)
Sodium: 138 mEq/L (ref 137–147)

## 2013-09-30 LAB — CBC
HCT: 26 % — ABNORMAL LOW (ref 39.0–52.0)
Hemoglobin: 8.8 g/dL — ABNORMAL LOW (ref 13.0–17.0)
MCH: 32.7 pg (ref 26.0–34.0)
MCHC: 33.8 g/dL (ref 30.0–36.0)
MCV: 96.7 fL (ref 78.0–100.0)
PLATELETS: 192 10*3/uL (ref 150–400)
RBC: 2.69 MIL/uL — ABNORMAL LOW (ref 4.22–5.81)
RDW: 13.8 % (ref 11.5–15.5)
WBC: 6 10*3/uL (ref 4.0–10.5)

## 2013-09-30 MED ORDER — TRAZODONE HCL 50 MG PO TABS: 50.0000 mg | ORAL_TABLET | Freq: Every evening | ORAL | Status: DC | PRN

## 2013-09-30 MED ORDER — MORPHINE SULFATE 15 MG PO TABS: 15.0000 mg | ORAL_TABLET | ORAL | Status: DC | PRN

## 2013-09-30 MED ORDER — ASPIRIN 81 MG PO TBEC: 325.0000 mg | DELAYED_RELEASE_TABLET | Freq: Every day | ORAL | Status: DC

## 2013-09-30 MED ORDER — CIPROFLOXACIN HCL 500 MG PO TABS: 500.0000 mg | ORAL_TABLET | Freq: Two times a day (BID) | ORAL | Status: DC

## 2013-09-30 MED ORDER — LISINOPRIL 10 MG PO TABS: 10.0000 mg | ORAL_TABLET | Freq: Every morning | ORAL | Status: DC

## 2013-09-30 MED ORDER — VANCOMYCIN 50 MG/ML ORAL SOLUTION: 250.0000 mg | Freq: Four times a day (QID) | ORAL | Status: DC

## 2013-09-30 MED ORDER — SIMVASTATIN 20 MG PO TABS: 20.0000 mg | ORAL_TABLET | Freq: Every day | ORAL | Status: DC

## 2013-09-30 MED ORDER — HYDROCODONE-ACETAMINOPHEN 5-325 MG PO TABS: 1.0000 | ORAL_TABLET | Freq: Four times a day (QID) | ORAL | Status: DC | PRN

## 2013-09-30 MED ORDER — METOPROLOL TARTRATE 25 MG PO TABS: 25.0000 mg | ORAL_TABLET | Freq: Two times a day (BID) | ORAL | Status: DC

## 2013-09-30 MED ORDER — FLUOXETINE HCL 20 MG PO CAPS: 20.0000 mg | ORAL_CAPSULE | Freq: Every day | ORAL | Status: DC

## 2013-09-30 MED ORDER — CLONAZEPAM 0.5 MG PO TABS: 0.5000 mg | ORAL_TABLET | Freq: Three times a day (TID) | ORAL | Status: DC | PRN

## 2013-09-30 NOTE — Discharge Summary (Signed)
Physician Discharge Summary  Tanner [name] [id number] DOB: 06-11-1956 DOA: 09/10/2013  PCP: Criselda Peaches, MD  Admit date: 09/10/2013 Discharge date: 09/30/2013  Recommendations for Outpatient Follow-up:  1. Pt will need to follow up with PCP in 2-3 weeks post discharge 2. Please obtain BMP to evaluate electrolytes and kidney function 3. Please also check CBC to evaluate Hg and Hct levels 4. Pt will continue taking vancomycin orally for C. Diff, stop date is March 27th, 2015 5. Pt also discharged on Ciprofloxacin for Pseudomonas UTI  Discharge Diagnoses: Ileus, UTI, C. Diff Principal Problem:   Ileus Active Problems:   Malignant neoplasm of prostate   CVA (cerebral infarction)   Hypokalemia   C. difficile colitis   UTI (urinary tract infection)   Essential hypertension, benign  Discharge Condition: Stable  Diet recommendation: Heart healthy diet discussed in details   Brief narrative:  58 year old male with history of prostate cancer, hypertension, alcoholic hepatitis, status post radical robotic prostatectomy with bilateral pelvic lymph node dissection and lysis of colonic additions on 08/29/13, postop acute left corona radiata infarct with associated right hemiparesis on 09/02/13, transferred to inpatient rehabilitation on 09/05/13, developed nausea, vomiting, diarrhea and abdominal distention. GI was consulted and diagnosed with postop ileus, C. diff colitis, narcotics were stopped, NG tube and rectal tube were placed, started on IV Reglan, found to be hypokalemic and urine cultures grew Escherichia coli 55,000 colonies. Due to worsening condition, he was transferred to the hospital on 09/10/13 for further management.   Assessment/Plan:  SBO  - multifactorial and secondary to adhesions, immobility, C. Diff  - continued clinical improvement, tolerating regular diet well  - off TPN since 3/19  - NG out 3/12,  - continue supportive care with analgesia, antiemetics as needed  -  rectal tube out 3/20 as stool is more formed  - GI & surgery were following, signed off 3/15  Hypokalemia  - secondary to GI loses  - supplemented and WNL this AM C. difficile colitis  - continue Vancomycin PO until March 27th, 2015  Escherichia coli UTI versus colonization  - Patient was on day 7 of IV antibiotics initially  - changed from Rocephin to cefazolin then stopped  - UA now with more leukocytes 3/20, placed on empiric Rocephin but urine culture is positive for Pseudomonas sensitive to Cipro  - continue upon discharge  S/P Radical prostatectomy  Hypertension  - reasonable inpatient control  - continue Metoprolol PO  Anemia of chronic disease  - Hg and Hct stable over the past 48 hours  Recent stroke with right hemiparesis:  - PT inpatient  Incidental finding of right ICA aneurysm  - Will need followup with repeat imaging in one year.  Anxiety and depression  - Psychiatry input on 3/9 appreciated.  - Ativan PRN   Code Status: Full  Family Communication: Discussed with patient's sister   Consultants:  Gastroenterology  General surgery  Urology Procedures:  Foley catheter  NG tube  Rectal tube Antibiotics:  IV cefazolin 3/3 > 3/4  IV Rocephin 3/4 > 3/5, 3/21 --> 3/23  IV cefazolin 3/5 > 3/10  Cipro 3/23 --> continue upon discharge  Vancomycin PO until March 27th, this will complete 2 weeks therapy for C. Diff   Discharge Exam: Filed Vitals:   09/30/13 0900  BP:   Pulse: 94  Temp:   Resp:    Filed Vitals:   09/29/13 1647 09/29/13 2100 09/30/13 0648 09/30/13 0900  BP: 134/74 146/92 144/93   Pulse:  99 90 94  Temp:  99.5 F (37.5 C) 99.7 F (37.6 C)   TempSrc:  Oral Oral   Resp:  18 18   Height:      Weight:   75.751 kg (167 lb)   SpO2:  99% 99% 99%    General: Pt is alert, follows commands appropriately, not in acute distress Cardiovascular: Regular rate and rhythm, S1/S2 +, no murmurs, no rubs, no gallops Respiratory: Clear to auscultation  bilaterally, no wheezing, no crackles, no rhonchi Abdominal: Soft, non tender, non distended, bowel sounds +, no guarding Extremities: no edema, no cyanosis, pulses palpable bilaterally DP and PT Neuro: Grossly nonfocal  Discharge Instructions  Discharge Orders   Future Orders Complete By Expires   Diet - low sodium heart healthy  As directed    Increase activity slowly  As directed        Medication List    STOP taking these medications       doxazosin 8 MG tablet  Commonly known as:  CARDURA     OVER THE COUNTER MEDICATION     senna-docusate 8.6-50 MG per tablet  Commonly known as:  SENOKOT S      TAKE these medications       aspirin 81 MG EC tablet  Take 4 tablets (325 mg total) by mouth daily.     ciprofloxacin 500 MG tablet  Commonly known as:  CIPRO  Take 1 tablet (500 mg total) by mouth 2 (two) times daily.     clonazePAM 0.5 MG tablet  Commonly known as:  KLONOPIN  Take 1 tablet (0.5 mg total) by mouth 3 (three) times daily as needed for anxiety.     FLUoxetine 20 MG capsule  Commonly known as:  PROZAC  Take 1 capsule (20 mg total) by mouth daily.     furosemide 40 MG tablet  Commonly known as:  LASIX  Take 20 mg by mouth every morning.     GOODYS EXTRA STRENGTH 260-130-16 MG Tabs  Generic drug:  Aspirin-Acetaminophen-Caffeine  Take 1 Package by mouth daily as needed (for pain).     HYDROcodone-acetaminophen 5-325 MG per tablet  Commonly known as:  NORCO/VICODIN  Take 1 tablet by mouth every 6 (six) hours as needed for moderate pain.     hyoscyamine 0.125 MG tablet  Commonly known as:  LEVSIN, ANASPAZ  Take 1 tablet (0.125 mg total) by mouth every 4 (four) hours as needed (bladder spasms).     lisinopril 10 MG tablet  Commonly known as:  PRINIVIL,ZESTRIL  Take 1 tablet (10 mg total) by mouth every morning.     metoprolol tartrate 25 MG tablet  Commonly known as:  LOPRESSOR  Take 1 tablet (25 mg total) by mouth 2 (two) times daily.      morphine 15 MG tablet  Commonly known as:  MSIR  Take 1 tablet (15 mg total) by mouth every 4 (four) hours as needed for moderate pain or severe pain.     omeprazole 40 MG capsule  Commonly known as:  PRILOSEC  Take 40 mg by mouth 2 (two) times daily.     simvastatin 20 MG tablet  Commonly known as:  ZOCOR  Take 1 tablet (20 mg total) by mouth daily at 6 PM.     traZODone 50 MG tablet  Commonly known as:  DESYREL  Take 1 tablet (50 mg total) by mouth at bedtime as needed for sleep.     vancomycin 50 mg/mL oral solution  Commonly known as:  VANCOCIN  Take 5 mLs (250 mg total) by mouth every 6 (six) hours. Stop date March 27th, 2015           Follow-up Information   Schedule an appointment as soon as possible for a visit with GREEN, Keenan Bachelor, MD.   Specialty:  Internal Medicine   Contact information:   [address] [place] [address] [phone]       Follow up with Faye Ramsay, MD. (As needed, If symptoms worsen, call my cell phone with question [phone])    Specialty:  Internal Medicine   Contact information:   [address]  [address] [phone]        The results of significant diagnostics from this hospitalization (including imaging, microbiology, ancillary and laboratory) are listed below for reference.     Microbiology: Recent Results (from the past 240 hour(s))  URINE CULTURE     Status: None   Collection Time    09/26/13  2:57 PM      Result Value Ref Range Status   Specimen Description URINE, CLEAN CATCH   Final   Special Requests NONE   Final   Culture  Setup Time     Final   Value: 09/26/2013 20:05     Performed at [place]     Final   Value: 85,000 COLONIES/ML     Performed at [facility]   Culture     Final   Value: PSEUDOMONAS AERUGINOSA     Note: Two isolates with different morphologies were identified as the same organism.The most resistant organism was  reported.     Performed at [facility]   Report Status 09/29/2013 FINAL   Final   Organism ID, Bacteria PSEUDOMONAS AERUGINOSA   Final     Labs: Basic Metabolic Panel:  Recent Labs Lab 09/24/13 0535 09/25/13 0700 09/26/13 0420 09/27/13 0515 09/29/13 0424 09/30/13 0515  NA 135* 129* 137 136* 137 138  K 3.9 5.2 4.1 3.8 3.5* 4.0  CL 99 94* 102 101 101 102  CO2 21 22 23 24 23 23   GLUCOSE 99 506* 87 84 91 86  BUN 15 12 13 15 14 15   CREATININE 0.71 0.72 0.76 0.83 0.80 0.79  CALCIUM 8.7 8.7 8.7 8.5 8.6 8.6  MG 1.6 2.0  --   --   --   --   PHOS 3.9 5.6*  --   --   --   --    Liver Function Tests:  Recent Labs Lab 09/25/13 0700  AST 19  ALT 10  ALKPHOS 54  BILITOT <0.2*  PROT 5.8*  ALBUMIN 2.5*   No results found for this basename: LIPASE, AMYLASE,  in the last 168 hours No results found for this basename: AMMONIA,  in the last 168 hours CBC:  Recent Labs Lab 09/26/13 0420 09/27/13 0515 09/29/13 0424 09/30/13 0515  WBC 6.8 7.2 6.0 6.0  HGB 9.0* 8.7* 8.7* 8.8*  HCT 26.6* 26.4* 25.9* 26.0*  MCV 96.7 96.4 96.3 96.7  PLT 198 194 198 192   Cardiac Enzymes: No results found for this basename: CKTOTAL, CKMB, CKMBINDEX, TROPONINI,  in the last 168 hours BNP: BNP (last 3 results) No results found for this basename: PROBNP,  in the last 8760 hours CBG: No results found for this basename: GLUCAP,  in the last 168 hours   SIGNED: Time coordinating discharge: Over 30 minutes  MAGICK-Elihue Ebert,  Rebecca Eaton, MD  [facility] 09/30/2013, 11:59 AM Pager [phone]  If 7PM-7AM, please contact night-coverage [email or web address] Password [email or web address]    \

## 2013-09-30 NOTE — Progress Notes (Signed)
Pt discharged per Md order and protocol. Discharge instructions reviewed with patient and sister. Pt aware of all follow up appointments and given all prescriptions.

## 2013-09-30 NOTE — Progress Notes (Signed)
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 Now transfered back to acute for ileus vs SBO.   OT comments  Pt is making progress toward original goals.  Cont at this time with remaining goals.  Feel he can d/c home with [facility] if sister is at home to assist initially when pt goes home.  Follow Up Recommendations  Home health OT    Equipment Recommendations  3 in 1 bedside comode    Recommendations for Other Services      Precautions / Restrictions Precautions Precautions: Fall Precaution Comments: condom catheter, picc line RUE Restrictions Weight Bearing Restrictions: No       Mobility Bed Mobility Overal bed mobility: Modified Independent Bed Mobility: Supine to Sit     Supine to sit: Modified independent (Device/Increase time)     General bed mobility comments: incr time for safety on air mattress  Transfers Overall transfer level: Needs assistance Equipment used: Rolling walker (2 wheeled) Transfers: Sit to/from [facility] Sit to Stand: Supervision;Modified independent (Device/Increase time) Stand pivot transfers: Supervision;Modified independent (Device/Increase time)       General transfer comment: continues to have dizziness, however was steady on his feet and uses RW properly    Balance     Sitting balance-Leahy Scale: Fair       Standing balance-Leahy Scale: Poor                     ADL Eating/Feeding: Modified independent;Set up;Sitting Grooming: Wash/dry hands;Wash/dry face;Oral care;Supervision/safety;Standing       Lower Body Dressing: Supervision/safety;Sit to/from stand Toilet Transfer: Supervision/safety;Ambulation Toileting- Clothing Manipulation and  Hygiene: Supervision/safety;Sit to/from stand   Functional mobility during ADLs: Supervision/safety;Rolling walker General ADL Comments: Pt doing well with adls.  Encouragement needed to use the RUE as much as possible.      Vision Eye Alignment: Within Functional Limits                   Perception     Praxis      Cognition   Behavior During Therapy: WFL for tasks assessed/performed Overall Cognitive Status: Within Functional Limits for tasks assessed                       Extremity/Trunk Assessment               Exercises General Exercises - Upper Extremity Shoulder Flexion: Strengthening;Both;15 reps;Theraband Theraband Level (Shoulder Flexion): Level 3 (Green) Shoulder Extension: Both;15 reps;Theraband;Seated Theraband Level (Shoulder Extension): Level 3 (Green) Elbow Flexion: Both;15 reps;Supine;Theraband Theraband Level (Elbow Flexion): Level 3 (Green) Elbow Extension: Both;15 reps;Supine;Theraband Theraband Level (Elbow Extension): Level 3 (Green) Other Exercises Other Exercises: Pt reports he has been doing his bed exercises throughout the day. Other Exercises: Encouragement needed to use RUE for funtional tasks instead of L hand.     General Comments      Pertinent Vitals/ Pain       No reports of pain.  Home Living                                          Prior Functioning/Environment  Frequency Min 3X/week     Progress Toward Goals  OT Goals(current goals can now be found in the care plan section)  Progress towards OT goals: Progressing toward goals  Acute Rehab OT Goals Patient Stated Goal: recover and get back to being independent OT Goal Formulation: With patient Time For Goal Achievement: 09/26/13 Potential to Achieve Goals: Good ADL Goals Pt Will Perform Eating: with modified independence;sitting;with adaptive utensils Pt Will Perform Grooming: with min assist;standing Pt Will  Perform Upper Body Bathing: with min assist;sitting Pt Will Perform Lower Body Bathing: with set-up;with supervision;with adaptive equipment;sit to/from stand Pt Will Perform Upper Body Dressing: with min assist;sitting Pt Will Perform Lower Body Dressing: with set-up;with supervision;with adaptive equipment;sit to/from stand Pt Will Transfer to Toilet: with min assist;ambulating;bedside commode Pt Will Perform Toileting - Clothing Manipulation and hygiene: with min assist;sit to/from stand Pt/caregiver will Perform Home Exercise Program: Increased ROM;Increased strength;Right Upper extremity;Independently Additional ADL Goal #1: pt will maintain static standing with min guard for 2 minutes for adls Additional ADL Goal #2: pt will perform RUE distal strengthening program and bil coordination with set up Additional ADL Goal #3: pt will protect RUE during bed mobility and scooting without cues Additional ADL Goal #4: pt will perform ub adls with set up unsupported sitting  Plan Discharge plan needs to be updated    End of Session Equipment Utilized During Treatment: Gait belt;Rolling walker  Activity Tolerance Patient tolerated treatment well   Patient Left in chair;with call bell/phone within reach   Nurse Communication Mobility status        Time: 9480-1655 OT Time Calculation (min): 17 min  Charges: OT General Charges $OT Visit: 1 Procedure OT Treatments $Self Care/Home Management : 8-22 mins  Glenford Peers 09/30/2013, 12:10 PM [phone]

## 2013-09-30 NOTE — Progress Notes (Signed)
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 Now transfered back to acute for ileus vs SBO.    PT Comments    Pt with normal BP response supine to sit, however did drop with sit to stand. Performed standing exercises and able to get BP to come up (but not back to baseline). Dizziness was less than 3/23 and able to tolerate ambulation and stair training. Pt did well with his balance and feels prepared to go home from a mobility stand point.    Follow Up Recommendations  Home health PT;Supervision - Intermittent     Equipment Recommendations  None recommended by PT    Recommendations for Other Services       Precautions / Restrictions Precautions Precautions: Fall Precaution Comments: condom catheter, picc line RUE Restrictions Weight Bearing Restrictions: No    Mobility  Bed Mobility Overal bed mobility: Modified Independent Bed Mobility: Supine to Sit     Supine to sit: Modified independent (Device/Increase time)     General bed mobility comments: incr time for safety on air mattress  Transfers Overall transfer level: Needs assistance Equipment used: Rolling walker (2 wheeled) Transfers: Sit to/from [facility] Sit to Stand: Supervision;Modified independent (Device/Increase time) Stand pivot transfers: Supervision;Modified independent (Device/Increase time)       General transfer comment: continues to have dizziness, however was steady on his feet and uses RW properly  Ambulation/Gait Ambulation/Gait assistance: Supervision Ambulation Distance (Feet): 150 Feet Assistive device: Rolling walker (2 wheeled) Gait Pattern/deviations: Step-through pattern;Decreased stride length (Rt knee  hyperextension x 2) Gait velocity: decreased Gait velocity interpretation: Below normal speed for age/gender     Stairs Stairs: Yes Stairs assistance: Min guard Stair Management: Two rails;Step to pattern;Forwards Number of Stairs: 5 General stair comments: vc for technique; no unsteadiness, minguard for safety  Wheelchair Mobility    Modified Rankin (Stroke Patients Only) Modified Rankin (Stroke Patients Only) Pre-Morbid Rankin Score: No symptoms Modified Rankin: Moderately severe disability     Balance     Sitting balance-Leahy Scale: Fair       Standing balance-Leahy Scale: Poor                      Cognition Arousal/Alertness: Awake/alert Behavior During Therapy: WFL for tasks assessed/performed Overall Cognitive Status: Within Functional Limits for tasks assessed                      Exercises Other Exercises Other Exercises: Pt reports he has been doing his bed exercises throughout the day.    General Comments        Pertinent Vitals/Pain See vitals flow sheet. Dizziness mild compared to 3/23; no nausea Denied pain    Home Living                      Prior Function            PT Goals (current goals can now be found in the care plan section) Acute Rehab PT Goals Patient Stated Goal: recover and get back to being independent PT Goal Formulation: With patient Time For Goal Achievement: 10/03/13 Potential to Achieve Goals: Good    Frequency  Min 4X/week    PT Plan Current plan remains appropriate  End of Session Equipment Utilized During Treatment: Gait belt Activity Tolerance: Patient tolerated treatment well Patient left: with call bell/phone within reach;in chair     Time: 1012-1102 PT Time Calculation (min): 50 min  Charges:  $Gait Training: 38-52 mins                    G Codes:      Tanner [name] Oct 09, 2013, 11:58 AM Pager [phone]

## 2013-09-30 NOTE — Progress Notes (Signed)
NUTRITION FOLLOW UP  DOCUMENTATION CODES Per approved criteria  -Severe malnutrition in the context of acute illness or injury   Intervention: Continue Resource Breeze po TID, each supplement provides 250 kcal and 9 grams of protein RD to follow for nutrition care plan  Nutrition Dx: Inadequate oral intake, resolved  Goal: Pt to meet >/= 90% of their estimated nutrition needs, met  Monitor:  PO intake, weight, labs, I/O's  ASSESSMENT: 58 y.o. male with PMH of prostate cancer and hypertension who has had recent prostatectomy following which patient had developed right-sided hemiplegia and was found to have acute CVA and was eventually transferred to rehabilitation where patient started developing postoperative ileus. Gastroenterologist was consulted and patient's narcotics were stopped and patient was placed on NG tube and rectal tube.   Patient also was started on IV Reglan and was also found to have hypokalemia. Patient also was found to be febrile and urine cultures Escherichia coli. Since patient's status did not improve and needed further acute care patient was transferred to [facility] cone from the rehabilitation from fourth floor. Patient on my exam denies any chest pain shortness of breath. Has distended abdomen. Has no abdominal pain at this time. Denies any fever chills. Is alert awake oriented.   Patient's TPN discontinued 3/19.  Advanced to a Regular diet.  Appetite good.  PO intake mostly 75% at meals.  Resource Breeze oral supplements in place.  Plan for Home Health care upon D/C.  Height: Ht Readings from Last 1 Encounters:  09/28/13 5' 10"  (1.778 m)    Weight: Wt Readings from Last 1 Encounters:  09/30/13 167 lb (75.751 kg)    BMI:  Body mass index is 23.96 kg/(m^2).  Re-estimated nutrition needs: Kcal: 2100-2300 Protein: 110-120 gm Fluid: 2.1-2.3 L  Skin: Intact  Diet Order: General   Intake/Output Summary (Last 24 hours) at 09/30/13 1402 Last data filed  at 09/30/13 0800  Gross per 24 hour  Intake    240 ml  Output   1200 ml  Net   -960 ml    Labs:   Recent Labs Lab 09/24/13 0535 09/25/13 0700  09/27/13 0515 09/29/13 0424 09/30/13 0515  NA 135* 129*  < > 136* 137 138  K 3.9 5.2  < > 3.8 3.5* 4.0  CL 99 94*  < > 101 101 102  CO2 21 22  < > 24 23 23   BUN 15 12  < > 15 14 15   CREATININE 0.71 0.72  < > 0.83 0.80 0.79  CALCIUM 8.7 8.7  < > 8.5 8.6 8.6  MG 1.6 2.0  --   --   --   --   PHOS 3.9 5.6*  --   --   --   --   GLUCOSE 99 506*  < > 84 91 86  < > = values in this interval not displayed.  CBG (last 3)  No results found for this basename: GLUCAP,  in the last 72 hours  Scheduled Meds: . aspirin  325 mg Oral Daily  . ciprofloxacin  500 mg Oral BID  . enoxaparin (LOVENOX) injection  40 mg Subcutaneous QHS  . famotidine  20 mg Oral BID  . feeding supplement (RESOURCE BREEZE)  1 Container Oral TID BM  . FLUoxetine  20 mg Oral Daily  . metoprolol tartrate  25 mg Oral BID  . metroNIDAZOLE  500 mg Oral 3 times per day  . simvastatin  20 mg Oral q1800  . vancomycin  250 mg Oral 4 times per day    Continuous Infusions:    Past Medical History  Diagnosis Date  . Alcoholic hepatitis   . Hypertension   . 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 Shortness of breath     new onset- occasionally-at rest and exertion  . GERD (gastroesophageal reflux disease)   . Anxiety   . TIA (transient ischemic attack) 08/2013  . Arthritis     " in my spine "  . Ileus 09/2013    Past Surgical History  Procedure Laterality Date  . Hand surgery Right   . Hernia repair    . Orif ankle fracture Right 12/10/2012    Procedure: OPEN REDUCTION INTERNAL FIXATION (ORIF) ANKLE FRACTURE;  Surgeon: Hessie Dibble, MD;  Location: [facility];  Service: Orthopedics;  Laterality: Right;  . Robot assisted laparoscopic radical prostatectomy N/A 08/29/2013    Procedure: ROBOTIC ASSISTED LAPAROSCOPIC RADICAL PROSTATECTOMY LEVEL 2, Lysis of adhesions;   Surgeon: Molli Hazard, MD;  Location: [facility];  Service: Urology;  Laterality: N/A;  . Lymphadenectomy Bilateral 08/29/2013    Procedure: LYMPHADENECTOMY "BILATERAL PELVIC LYMPH NODE DISSECTION";  Surgeon: Molli Hazard, MD;  Location: [facility];  Service: Urology;  Laterality: Bilateral;    Arthur Holms, RD, LDN Pager #: [phone] After-Hours Pager #: [phone]

## 2013-09-30 NOTE — Discharge Instructions (Signed)
Clostridium Difficile Infection Clostridium difficile (C. difficile) is a bacteria found in the intestinal tract or colon. Under certain conditions, it causes diarrhea and sometimes severe disease. The severe form of the disease is known as pseudomembranous colitis (often called C. difficile colitis). This disease can damage the lining of the colon or cause the colon to become enlarged (toxic megacolon).  CAUSES  Your colon normally contains many different bacteria, including C. difficile. The balance of bacteria in your colon can change during illness. This is especially true when you take antibiotic medicine. Taking antibiotics may allow the C. difficile to grow, multiply excessively, and make a toxin that then causes illness. The elderly and people with certain medical conditions have a greater risk of getting C. difficile infections. SYMPTOMS   Watery diarrhea.  Fever.  Fatigue.  Loss of appetite.  Nausea.  Abdominal swelling, pain, or tenderness.  Dehydration. DIAGNOSIS  Your symptoms may make your caregiver suspicious of a C. difficile infection, especially if you have used antibiotics in the preceding weeks. However, there are only 2 ways to know for certain whether you have a C. difficile infection:  A lab test that finds the toxin in your stool.  The specific appearance of an abnormality (pseudomembrane) in your colon. This can only be seen by doing a sigmoidoscopy or colonoscopy. These procedures involve passing an instrument through your rectum to look at the inside of your colon. Your caregiver will help determine if these tests are necessary. TREATMENT   Most people are successfully treated with one of two specific antibiotics, usually given by mouth. Other antibiotics you are receiving are stopped if possible.  Intravenous (IV) fluids and correction of electrolyte imbalance may be necessary.  Rarely, surgery may be needed to remove the infected part of the  intestines.  Careful hand washing by you and your caregivers is important to prevent the spread of infection. In the hospital, your caregivers may also put on gowns and gloves to prevent the spread of the C. difficile bacteria. Your room is also cleaned regularly with a solution containing bleach or a product that is known to kill C. difficile. HOME CARE INSTRUCTIONS  Drink enough fluids to keep your urine clear or pale yellow. Avoid milk, caffeine, and alcohol.  Ask your caregiver for specific rehydration instructions.  Try eating small, frequent meals rather than large meals.  Take your antibiotics as directed. Finish them even if you start to feel better.  Do not use medicines to slow diarrhea. This could delay healing or cause complications.  Wash your hands thoroughly after using the bathroom and before preparing food.  Make sure people who live with you wash their hands often, too.  Carefully disinfect all surfaces with a product that contains chlorine bleach. SEEK MEDICAL CARE IF:  Diarrhea persists longer than expected or recurs after completing your course of antibiotic treatment for the C. difficile infection.  You have trouble staying hydrated. SEEK IMMEDIATE MEDICAL CARE IF:  You develop a new fever.  You have increasing abdominal pain or tenderness.  There is blood in your stools, or your stools are dark black and tarry.  You cannot hold down food or liquids. MAKE SURE YOU:   Understand these instructions.  Will watch your condition.  Will get help right away if you are not doing well or get worse. Document Released: 04/05/2005 Document Revised: 10/21/2012 Document Reviewed: 12/02/2010 [facility] Patient Information 2014 [place], [place]. Urinary Tract Infection Urinary tract infections (UTIs) can develop anywhere along your  urinary tract. Your urinary tract is your body's drainage system for removing wastes and extra water. Your urinary tract includes two  kidneys, two ureters, a bladder, and a urethra. Your kidneys are a pair of bean-shaped organs. Each kidney is about the size of your fist. They are located below your ribs, one on each side of your spine. CAUSES Infections are caused by microbes, which are microscopic organisms, including fungi, viruses, and bacteria. These organisms are so small that they can only be seen through a microscope. Bacteria are the microbes that most commonly cause UTIs. SYMPTOMS  Symptoms of UTIs may vary by age and gender of the patient and by the location of the infection. Symptoms in young women typically include a frequent and intense urge to urinate and a painful, burning feeling in the bladder or urethra during urination. Older women and men are more likely to be tired, shaky, and weak and have muscle aches and abdominal pain. A fever may mean the infection is in your kidneys. Other symptoms of a kidney infection include pain in your back or sides below the ribs, nausea, and vomiting. DIAGNOSIS To diagnose a UTI, your caregiver will ask you about your symptoms. Your caregiver also will ask to provide a urine sample. The urine sample will be tested for bacteria and white blood cells. White blood cells are made by your body to help fight infection. TREATMENT  Typically, UTIs can be treated with medication. Because most UTIs are caused by a bacterial infection, they usually can be treated with the use of antibiotics. The choice of antibiotic and length of treatment depend on your symptoms and the type of bacteria causing your infection. HOME CARE INSTRUCTIONS  If you were prescribed antibiotics, take them exactly as your caregiver instructs you. Finish the medication even if you feel better after you have only taken some of the medication.  Drink enough water and fluids to keep your urine clear or pale yellow.  Avoid caffeine, tea, and carbonated beverages. They tend to irritate your bladder.  Empty your bladder  often. Avoid holding urine for long periods of time.  Empty your bladder before and after sexual intercourse.  After a bowel movement, women should cleanse from front to back. Use each tissue only once. SEEK MEDICAL CARE IF:   You have back pain.  You develop a fever.  Your symptoms do not begin to resolve within 3 days. SEEK IMMEDIATE MEDICAL CARE IF:   You have severe back pain or lower abdominal pain.  You develop chills.  You have nausea or vomiting.  You have continued burning or discomfort with urination. MAKE SURE YOU:   Understand these instructions.  Will watch your condition.  Will get help right away if you are not doing well or get worse. Document Released: 04/05/2005 Document Revised: 12/26/2011 Document Reviewed: 08/04/2011 [facility] Patient Information 2014 [place].

## 2013-09-30 NOTE — Progress Notes (Signed)
Removed condom cath at 1705. Tolerated well.

## 2014-01-01 ENCOUNTER — Encounter: Payer: Self-pay | Admitting: *Deleted

## 2014-01-12 DIAGNOSIS — I6350 Cerebral infarction due to unspecified occlusion or stenosis of unspecified cerebral artery: Secondary | ICD-10-CM | POA: Diagnosis not present

## 2014-01-12 DIAGNOSIS — F3289 Other specified depressive episodes: Secondary | ICD-10-CM | POA: Diagnosis not present

## 2014-01-12 DIAGNOSIS — F329 Major depressive disorder, single episode, unspecified: Secondary | ICD-10-CM | POA: Diagnosis not present

## 2014-01-12 DIAGNOSIS — I10 Essential (primary) hypertension: Secondary | ICD-10-CM | POA: Diagnosis not present

## 2014-01-19 DIAGNOSIS — I10 Essential (primary) hypertension: Secondary | ICD-10-CM | POA: Diagnosis not present

## 2014-01-19 DIAGNOSIS — I6350 Cerebral infarction due to unspecified occlusion or stenosis of unspecified cerebral artery: Secondary | ICD-10-CM | POA: Diagnosis not present

## 2014-02-02 ENCOUNTER — Encounter: Payer: Self-pay | Admitting: Internal Medicine

## 2014-02-20 ENCOUNTER — Encounter: Payer: Self-pay | Admitting: Gastroenterology

## 2014-02-27 DIAGNOSIS — Z0000 Encounter for general adult medical examination without abnormal findings: Secondary | ICD-10-CM | POA: Diagnosis not present

## 2014-04-02 DIAGNOSIS — N529 Male erectile dysfunction, unspecified: Secondary | ICD-10-CM | POA: Diagnosis not present

## 2014-04-02 DIAGNOSIS — C61 Malignant neoplasm of prostate: Secondary | ICD-10-CM | POA: Diagnosis not present

## 2014-04-02 DIAGNOSIS — K432 Incisional hernia without obstruction or gangrene: Secondary | ICD-10-CM | POA: Diagnosis not present

## 2014-10-05 ENCOUNTER — Encounter: Payer: Self-pay | Admitting: Internal Medicine

## 2014-10-30 DIAGNOSIS — C61 Malignant neoplasm of prostate: Secondary | ICD-10-CM | POA: Diagnosis not present

## 2014-11-05 DIAGNOSIS — C61 Malignant neoplasm of prostate: Secondary | ICD-10-CM | POA: Diagnosis not present

## 2014-11-05 DIAGNOSIS — K432 Incisional hernia without obstruction or gangrene: Secondary | ICD-10-CM | POA: Diagnosis not present

## 2014-11-05 DIAGNOSIS — N529 Male erectile dysfunction, unspecified: Secondary | ICD-10-CM | POA: Diagnosis not present

## 2014-12-18 DIAGNOSIS — I10 Essential (primary) hypertension: Secondary | ICD-10-CM | POA: Diagnosis not present

## 2014-12-22 DIAGNOSIS — I10 Essential (primary) hypertension: Secondary | ICD-10-CM | POA: Diagnosis not present

## 2015-02-17 ENCOUNTER — Encounter (HOSPITAL_COMMUNITY): Payer: Self-pay | Admitting: Emergency Medicine

## 2015-02-17 ENCOUNTER — Inpatient Hospital Stay (HOSPITAL_COMMUNITY)
Admission: EM | Admit: 2015-02-17 | Discharge: 2015-02-19 | DRG: 312 | Disposition: A | Discharge status: Rehabilitation Facility | Payer: Medicare Other | Attending: Internal Medicine | Admitting: Internal Medicine

## 2015-02-17 ENCOUNTER — Emergency Department (HOSPITAL_COMMUNITY): Payer: Medicare Other

## 2015-02-17 DIAGNOSIS — R55 Syncope and collapse: Principal | ICD-10-CM | POA: Diagnosis present

## 2015-02-17 DIAGNOSIS — Z885 Allergy status to narcotic agent status: Secondary | ICD-10-CM

## 2015-02-17 DIAGNOSIS — F1020 Alcohol dependence, uncomplicated: Secondary | ICD-10-CM | POA: Diagnosis present

## 2015-02-17 DIAGNOSIS — S89001A Unspecified physeal fracture of upper end of right tibia, initial encounter for closed fracture: Secondary | ICD-10-CM | POA: Diagnosis not present

## 2015-02-17 DIAGNOSIS — S82101A Unspecified fracture of upper end of right tibia, initial encounter for closed fracture: Secondary | ICD-10-CM | POA: Diagnosis not present

## 2015-02-17 DIAGNOSIS — E871 Hypo-osmolality and hyponatremia: Secondary | ICD-10-CM | POA: Diagnosis not present

## 2015-02-17 DIAGNOSIS — S82831A Other fracture of upper and lower end of right fibula, initial encounter for closed fracture: Secondary | ICD-10-CM | POA: Diagnosis not present

## 2015-02-17 DIAGNOSIS — Z8546 Personal history of malignant neoplasm of prostate: Secondary | ICD-10-CM

## 2015-02-17 DIAGNOSIS — Z882 Allergy status to sulfonamides status: Secondary | ICD-10-CM

## 2015-02-17 DIAGNOSIS — M1990 Unspecified osteoarthritis, unspecified site: Secondary | ICD-10-CM | POA: Diagnosis present

## 2015-02-17 DIAGNOSIS — Z87891 Personal history of nicotine dependence: Secondary | ICD-10-CM

## 2015-02-17 DIAGNOSIS — F419 Anxiety disorder, unspecified: Secondary | ICD-10-CM | POA: Diagnosis present

## 2015-02-17 DIAGNOSIS — I10 Essential (primary) hypertension: Secondary | ICD-10-CM | POA: Diagnosis present

## 2015-02-17 DIAGNOSIS — R0600 Dyspnea, unspecified: Secondary | ICD-10-CM

## 2015-02-17 DIAGNOSIS — S82209A Unspecified fracture of shaft of unspecified tibia, initial encounter for closed fracture: Secondary | ICD-10-CM | POA: Diagnosis present

## 2015-02-17 DIAGNOSIS — Z79899 Other long term (current) drug therapy: Secondary | ICD-10-CM

## 2015-02-17 DIAGNOSIS — K219 Gastro-esophageal reflux disease without esophagitis: Secondary | ICD-10-CM | POA: Diagnosis present

## 2015-02-17 DIAGNOSIS — R0609 Other forms of dyspnea: Secondary | ICD-10-CM

## 2015-02-17 DIAGNOSIS — S82291A Other fracture of shaft of right tibia, initial encounter for closed fracture: Secondary | ICD-10-CM

## 2015-02-17 DIAGNOSIS — Z79891 Long term (current) use of opiate analgesic: Secondary | ICD-10-CM

## 2015-02-17 DIAGNOSIS — W19XXXA Unspecified fall, initial encounter: Secondary | ICD-10-CM | POA: Diagnosis present

## 2015-02-17 DIAGNOSIS — Z7982 Long term (current) use of aspirin: Secondary | ICD-10-CM

## 2015-02-17 DIAGNOSIS — Z9109 Other allergy status, other than to drugs and biological substances: Secondary | ICD-10-CM

## 2015-02-17 DIAGNOSIS — I69351 Hemiplegia and hemiparesis following cerebral infarction affecting right dominant side: Secondary | ICD-10-CM

## 2015-02-17 DIAGNOSIS — Z8673 Personal history of transient ischemic attack (TIA), and cerebral infarction without residual deficits: Secondary | ICD-10-CM

## 2015-02-17 DIAGNOSIS — S82201A Unspecified fracture of shaft of right tibia, initial encounter for closed fracture: Secondary | ICD-10-CM | POA: Diagnosis not present

## 2015-02-17 DIAGNOSIS — R918 Other nonspecific abnormal finding of lung field: Secondary | ICD-10-CM | POA: Diagnosis not present

## 2015-02-17 DIAGNOSIS — Z9079 Acquired absence of other genital organ(s): Secondary | ICD-10-CM | POA: Diagnosis present

## 2015-02-17 LAB — CBC WITH DIFFERENTIAL/PLATELET
BASOS ABS: 0 10*3/uL (ref 0.0–0.1)
Basophils Relative: 1 % (ref 0–1)
Eosinophils Absolute: 0.2 10*3/uL (ref 0.0–0.7)
Eosinophils Relative: 2 % (ref 0–5)
HCT: 39.3 % (ref 39.0–52.0)
Hemoglobin: 13.3 g/dL (ref 13.0–17.0)
Lymphocytes Relative: 18 % (ref 12–46)
Lymphs Abs: 1.2 10*3/uL (ref 0.7–4.0)
MCH: 32.6 pg (ref 26.0–34.0)
MCHC: 33.8 g/dL (ref 30.0–36.0)
MCV: 96.3 fL (ref 78.0–100.0)
Monocytes Absolute: 0.6 10*3/uL (ref 0.1–1.0)
Monocytes Relative: 9 % (ref 3–12)
NEUTROS ABS: 4.8 10*3/uL (ref 1.7–7.7)
NEUTROS PCT: 70 % (ref 43–77)
Platelets: 166 10*3/uL (ref 150–400)
RBC: 4.08 MIL/uL — ABNORMAL LOW (ref 4.22–5.81)
RDW: 12.8 % (ref 11.5–15.5)
WBC: 6.7 10*3/uL (ref 4.0–10.5)

## 2015-02-17 LAB — COMPREHENSIVE METABOLIC PANEL
ALBUMIN: 3.4 g/dL — AB (ref 3.5–5.0)
ALK PHOS: 62 U/L (ref 38–126)
ALT: 21 U/L (ref 17–63)
AST: 35 U/L (ref 15–41)
Anion gap: 12 (ref 5–15)
BILIRUBIN TOTAL: 1 mg/dL (ref 0.3–1.2)
BUN: 13 mg/dL (ref 6–20)
CHLORIDE: 96 mmol/L — AB (ref 101–111)
CO2: 19 mmol/L — ABNORMAL LOW (ref 22–32)
Calcium: 8.4 mg/dL — ABNORMAL LOW (ref 8.9–10.3)
Creatinine, Ser: 1.32 mg/dL — ABNORMAL HIGH (ref 0.61–1.24)
GFR calc Af Amer: >60 mL/min (ref >60)
GFR calc non Af Amer: 59 mL/min — ABNORMAL LOW (ref >60)
Glucose, Bld: 91 mg/dL (ref 65–99)
POTASSIUM: 4.7 mmol/L (ref 3.5–5.1)
Sodium: 127 mmol/L — ABNORMAL LOW (ref 135–145)
Total Protein: 6.6 g/dL (ref 6.5–8.1)

## 2015-02-17 LAB — I-STAT TROPONIN, ED: Troponin i, poc: 0 ng/mL (ref 0.00–0.08)

## 2015-02-17 MED ORDER — MORPHINE SULFATE 4 MG/ML IJ SOLN
4.0000 mg | Freq: Once | INTRAMUSCULAR | Status: AC
  Administered 2015-02-17: 4 mg via INTRAVENOUS
  Filled 2015-02-17: qty 1

## 2015-02-17 NOTE — ED Notes (Signed)
Pt. lost his balance and fell at home this evening , no LOC / ambulatory , reports pain at right knee with movement , speech clear / no facial asymmetry . Equal grips with no arm drift.

## 2015-02-17 NOTE — ED Notes (Signed)
Pt RLE placed in a knee immobilizer. Pt reports weakness and double vision after receiving morphine.

## 2015-02-17 NOTE — ED Provider Notes (Signed)
CSN: [id number]     Arrival date & time 02/17/15  2031 History  This chart was scribed for Tanner Grana, PA-C, working with Noemi Chapel, MD by Steva Colder, ED Scribe. The patient was seen in room TR10C/TR10C at 9:45 PM.    Chief Complaint  Patient presents with  . Fall      The history is provided by the patient. No language interpreter was used.    HPI Comments: Tanner [name] is a 59 y.o. male with a medical hx of HTN and stroke who presents to the Emergency Department complaining of fall onset this evening PTA. Pt notes that he has a recent hx of stroke that left him with right sided weakness. Pt felt a tremor and that is what caused him to fall today. Pt has not been ambulatory since the incident. He is complaining of severe right leg pain at the knee and shin, made worse with any movement or with palpation, he denies any significant swelling, color change, numbness or tingling.  He denies LOC or head trauma with the fall. Denies being on blood thinner medications.  He states he drinks at least 6 beers a day and smokes pot daily.  He denies any current SOB or chest pain, however family reports that over the past two weeks he has been weak, more clumsy, with frequent sweats and chills.     Past Medical History  Diagnosis Date  . Alcoholic hepatitis   . Hypertension   . 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 Shortness of breath     new onset- occasionally-at rest and exertion  . GERD (gastroesophageal reflux disease)   . Anxiety   . TIA (transient ischemic attack) 08/2013  . Arthritis     " in my spine "  . Ileus 09/2013   Past Surgical History  Procedure Laterality Date  . Hand surgery Right   . Hernia repair    . Orif ankle fracture Right 12/10/2012    Procedure: OPEN REDUCTION INTERNAL FIXATION (ORIF) ANKLE FRACTURE;  Surgeon: Hessie Dibble, MD;  Location: [facility];  Service: Orthopedics;  Laterality: Right;  . Robot assisted laparoscopic radical prostatectomy N/A  08/29/2013    Procedure: ROBOTIC ASSISTED LAPAROSCOPIC RADICAL PROSTATECTOMY LEVEL 2, Lysis of adhesions;  Surgeon: Molli Hazard, MD;  Location: [facility];  Service: Urology;  Laterality: N/A;  . Lymphadenectomy Bilateral 08/29/2013    Procedure: LYMPHADENECTOMY "BILATERAL PELVIC LYMPH NODE DISSECTION";  Surgeon: Molli Hazard, MD;  Location: [facility];  Service: Urology;  Laterality: Bilateral;   Family History  Problem Relation Age of Onset  . Pancreatic cancer Sister   . Lung cancer Father    Social History  Substance Use Topics  . Smoking status: Former Smoker -- 1.50 packs/day for 20 years    Quit date: 12/21/2012  . Smokeless tobacco: Never Used  . Alcohol Use: 0.0 oz/week     Comment: 5 beers day    Review of Systems  Constitutional: Positive for chills. Negative for fever.  HENT: Negative.   Respiratory: Negative.   Cardiovascular: Negative.   Gastrointestinal: Negative.   Genitourinary: Negative.   Musculoskeletal: Positive for joint swelling, arthralgias and gait problem. Negative for back pain and neck pain.  Skin: Negative for color change, rash and wound.  Neurological: Positive for tremors and weakness. Negative for speech difficulty.  Psychiatric/Behavioral: Negative.       Allergies  Dilaudid; Oxycodone; Procaine hcl; Zofran; and Sulfa antibiotics  Home Medications  Prior to Admission medications   Medication Sig Start Date End Date Taking? Authorizing Provider  acetaminophen-codeine (TYLENOL #3) 300-30 MG per tablet Take 1 tablet by mouth every 4 (four) hours as needed for moderate pain. 03/02/15   Lavon Paganini Angiulli, PA-C  clonazePAM (KLONOPIN) 0.5 MG tablet Take 1 tablet (0.5 mg total) by mouth 3 (three) times daily as needed for anxiety. 03/02/15   Lavon Paganini Angiulli, PA-C  FLUoxetine (PROZAC) 20 MG capsule Take 1 capsule (20 mg total) by mouth daily. 03/02/15   Lavon Paganini Angiulli, PA-C  folic acid (FOLVITE) 1 MG tablet Take 1 tablet (1 mg total) by  mouth daily. 03/02/15   Lavon Paganini Angiulli, PA-C  hyoscyamine (LEVSIN, ANASPAZ) 0.125 MG tablet Take 1 tablet (0.125 mg total) by mouth every 4 (four) hours as needed (bladder spasms). Patient not taking: Reported on 02/18/2015 08/29/13   Rolan Bucco, MD  lisinopril (PRINIVIL,ZESTRIL) 40 MG tablet Take 1 tablet (40 mg total) by mouth daily. 03/02/15   Lavon Paganini Angiulli, PA-C  metoprolol (LOPRESSOR) 50 MG tablet Take 1 tablet (50 mg total) by mouth 2 (two) times daily. 03/02/15   Lavon Paganini Angiulli, PA-C  Multiple Vitamin (MULTIVITAMIN WITH MINERALS) TABS tablet Take 1 tablet by mouth daily. 03/02/15   Lavon Paganini Angiulli, PA-C  omeprazole (PRILOSEC) 40 MG capsule Take 1 capsule (40 mg total) by mouth 2 (two) times daily. 03/02/15   Lavon Paganini Angiulli, PA-C  Rivaroxaban (XARELTO) 15 MG TABS tablet Continue until 03/16/2015 and stop 03/02/15   Lavon Paganini Angiulli, PA-C  rivaroxaban (XARELTO) 20 MG TABS tablet Begin 03/17/2015 03/02/15   Lavon Paganini Angiulli, PA-C  simvastatin (ZOCOR) 20 MG tablet Take 1 tablet (20 mg total) by mouth daily at 6 PM. 03/02/15   Lavon Paganini Angiulli, PA-C  traMADol (ULTRAM) 50 MG tablet Take 1 tablet (50 mg total) by mouth every 6 (six) hours as needed for moderate pain. 03/02/15   Lavon Paganini Angiulli, PA-C  traZODone (DESYREL) 50 MG tablet Take 1 tablet (50 mg total) by mouth at bedtime as needed for sleep. 03/02/15   Lavon Paganini Angiulli, PA-C   BP 157/86 mmHg  Pulse 77  Temp(Src) 99 F (37.2 C) (Oral)  Resp 16  SpO2 98% Physical Exam  Constitutional: He is oriented to person, place, and time. He appears well-developed and well-nourished. No distress.  Chronically appearing male, appears uncomfortable, older than stated age, in NAD, mildly diaphoretic  HENT:  Head: Normocephalic and atraumatic.  Nose: Nose normal.  Mouth/Throat: Oropharynx is clear and moist.  Eyes: Conjunctivae and EOM are normal. Pupils are equal, round, and reactive to light. Right eye exhibits no discharge. Left eye  exhibits no discharge. No scleral icterus.  Neck: Normal range of motion. No JVD present. No tracheal deviation present. No thyromegaly present.  Cardiovascular: Normal rate, regular rhythm, normal heart sounds and intact distal pulses.  Exam reveals no gallop and no friction rub.   No murmur heard. Pulmonary/Chest: Effort normal and breath sounds normal. No respiratory distress. He has no wheezes. He has no rales. He exhibits no tenderness.  Abdominal: Soft. He exhibits no distension.  Musculoskeletal: He exhibits tenderness. He exhibits no edema.  Generalized ttp of right knee over medial and lateral joint line, over tibial tuberosity, no obvious deformity Right ankle swelling and post-surgical scars, no tenderness Normal sensation, decreased pulses DP and PT, LE warm to the touch, no mottling   Lymphadenopathy:    He has no cervical adenopathy.  Neurological: He  is alert and oriented to person, place, and time. He has normal reflexes. No cranial nerve deficit. He exhibits normal muscle tone. Coordination normal.  Skin: Skin is warm. No rash noted. He is diaphoretic. No erythema. No pallor.  Psychiatric: He has a normal mood and affect. His behavior is normal. Judgment and thought content normal.  Nursing note and vitals reviewed.   ED Course  Procedures (including critical care time) DIAGNOSTIC STUDIES: Oxygen Saturation is 96% on RA, nl by my interpretation.    COORDINATION OF CARE: 9:52 PM Discussed treatment plan with pt at bedside and pt agreed to plan.   Labs Review Labs Reviewed  CBC WITH DIFFERENTIAL/PLATELET - Abnormal; Notable for the following:    RBC 4.08 (*)    All other components within normal limits  COMPREHENSIVE METABOLIC PANEL - Abnormal; Notable for the following:    Sodium 127 (*)    Chloride 96 (*)    CO2 19 (*)    Creatinine, Ser 1.32 (*)    Calcium 8.4 (*)    Albumin 3.4 (*)    GFR calc non Af Amer 59 (*)    All other components within normal limits   COMPREHENSIVE METABOLIC PANEL - Abnormal; Notable for the following:    Sodium 134 (*)    CO2 21 (*)    Glucose, Bld 103 (*)    Calcium 7.7 (*)    Total Protein 5.6 (*)    Albumin 2.7 (*)    ALT 16 (*)    All other components within normal limits  CBC WITH DIFFERENTIAL/PLATELET - Abnormal; Notable for the following:    RBC 3.42 (*)    Hemoglobin 11.3 (*)    HCT 33.4 (*)    Platelets 122 (*)    Neutrophils Relative % 80 (*)    Lymphocytes Relative 11 (*)    All other components within normal limits  CBC - Abnormal; Notable for the following:    RBC 3.43 (*)    Hemoglobin 11.1 (*)    HCT 33.6 (*)    Platelets 107 (*)    All other components within normal limits  BASIC METABOLIC PANEL - Abnormal; Notable for the following:    BUN <5 (*)    Calcium 7.9 (*)    All other components within normal limits  PROTIME-INR  APTT  TROPONIN I  TROPONIN I  TROPONIN I  I-STAT TROPOININ, ED    Imaging Review Dg Knee Complete 4 Views Right  02/17/2015   CLINICAL DATA:  Pain following fall earlier today  EXAM: RIGHT KNEE - COMPLETE 4+ VIEW  COMPARISON:  June 30, 2013  FINDINGS: Frontal, lateral, and bilateral oblique views were obtained. There is a comminuted fracture of the proximal tibia with fractures extending from the lateral aspect of the medial tibial plateau into the proximal tibial diaphysis. There also fractures in the proximal tibial metaphysis and medial tibial diaphysis. Alignment in these areas of fracture are near anatomic. No dislocation. There is a fat - fluid level in the suprapatellar bursa consistent with the fractures. No appreciable joint space narrowing.  IMPRESSION: Comminuted fractures of the proximal tibia with major fracture fragments in overall near anatomic alignment. Fat-fluid level in the suprapatellar bursa consistent with the fractures. No dislocation. No appreciable joint space narrowing.   Electronically Signed   By: Lowella Grip III M.D.   On: 02/17/2015  21:20    CLINICAL DATA: Further evaluation of known tibial fracture. Initial encounter.  EXAM: RIGHT TIBIA AND FIBULA -  2 VIEW  COMPARISON: Right tibia/fibula radiographs performed 12/09/2012  FINDINGS: As noted on recent prior studies, there is a comminuted oblique slightly displaced fracture extending across the proximal tibial metadiaphysis. An associated moderate knee joint effusion is noted. There is also a minimally displaced fracture involving the proximal fibula.  Distal fibular hardware appears grossly intact, without evidence of loosening. Distal tibial screws are also grossly unremarkable. Lateral soft tissue swelling is noted at the ankle. The ankle mortise is grossly unremarkable.  IMPRESSION: 1. Comminuted oblique slightly displaced fracture extending across the proximal tibial metadiaphysis, with associated moderate knee joint effusion. 2. Minimally displaced fracture involving the proximal fibula.   Electronically Signed  By: Garald Balding M.D.  On: 02/17/2015 23:24     EKG Interpretation   Date/Time:  Wednesday February 17 2015 23:03:27 EDT Ventricular Rate:  68 PR Interval:  177 QRS Duration: 99 QT Interval:  429 QTC Calculation: 456 R Axis:   76 Text Interpretation:  Sinus rhythm Normal ECG Since last tracing rate  slower Confirmed by MILLER  MD, BRIAN ([id number]) on 02/17/2015 11:54:02 PM      MDM   Final diagnoses:  Exertional dyspnea  Syncope  Fracture of tibia, right, closed, initial encounter  Hyponatremia    Pt with fall and RLE leg/knee pain - fall may be due to right-sided weakness s/p CVA, but pt is not well appearing - family is reporting multiple other constitutional sx of chills, sweats, generalized weakness.  Xray was obtained of knee before the time of my exam, pertinent for comminuted fx of proximal tibia.  The pt was discussed with Dr. Sabra Heck and additional film obtained to visualize tib-fib, which was pertinent for  fibular fx as well.  Basic labs and head CT were placed on hold while consulting ortho.  Meanwhile, pt had a reported syncopal episode in radiology, likely vasovagal.  He was transferred to Pod A, under the care of Dr. Sabra Heck, was place in a long-leg immobilizer.  Please see his documentation for remainder of work-up and tx while in the ED.  I personally performed the services described in this documentation, which was scribed in my presence. The recorded information has been reviewed and is accurate.    Tanner Grana, PA-C 03/10/15 [place], MD 03/12/15 [phone]

## 2015-02-17 NOTE — ED Notes (Signed)
Pt family now reports he has had chills for several weeks.

## 2015-02-18 ENCOUNTER — Encounter (HOSPITAL_COMMUNITY): Payer: Self-pay | Admitting: Internal Medicine

## 2015-02-18 ENCOUNTER — Emergency Department (HOSPITAL_COMMUNITY): Payer: Medicare Other

## 2015-02-18 DIAGNOSIS — I69351 Hemiplegia and hemiparesis following cerebral infarction affecting right dominant side: Secondary | ICD-10-CM | POA: Diagnosis not present

## 2015-02-18 DIAGNOSIS — S82101A Unspecified fracture of upper end of right tibia, initial encounter for closed fracture: Secondary | ICD-10-CM | POA: Diagnosis not present

## 2015-02-18 DIAGNOSIS — M1990 Unspecified osteoarthritis, unspecified site: Secondary | ICD-10-CM | POA: Diagnosis present

## 2015-02-18 DIAGNOSIS — Z8546 Personal history of malignant neoplasm of prostate: Secondary | ICD-10-CM | POA: Diagnosis not present

## 2015-02-18 DIAGNOSIS — D62 Acute posthemorrhagic anemia: Secondary | ICD-10-CM | POA: Diagnosis not present

## 2015-02-18 DIAGNOSIS — S82209S Unspecified fracture of shaft of unspecified tibia, sequela: Secondary | ICD-10-CM | POA: Diagnosis not present

## 2015-02-18 DIAGNOSIS — F1010 Alcohol abuse, uncomplicated: Secondary | ICD-10-CM | POA: Diagnosis not present

## 2015-02-18 DIAGNOSIS — Z79899 Other long term (current) drug therapy: Secondary | ICD-10-CM | POA: Diagnosis not present

## 2015-02-18 DIAGNOSIS — E871 Hypo-osmolality and hyponatremia: Secondary | ICD-10-CM

## 2015-02-18 DIAGNOSIS — S82209A Unspecified fracture of shaft of unspecified tibia, initial encounter for closed fracture: Secondary | ICD-10-CM | POA: Diagnosis present

## 2015-02-18 DIAGNOSIS — Z9109 Other allergy status, other than to drugs and biological substances: Secondary | ICD-10-CM | POA: Diagnosis not present

## 2015-02-18 DIAGNOSIS — S82201A Unspecified fracture of shaft of right tibia, initial encounter for closed fracture: Secondary | ICD-10-CM | POA: Diagnosis present

## 2015-02-18 DIAGNOSIS — Z7982 Long term (current) use of aspirin: Secondary | ICD-10-CM | POA: Diagnosis not present

## 2015-02-18 DIAGNOSIS — Z87891 Personal history of nicotine dependence: Secondary | ICD-10-CM | POA: Diagnosis not present

## 2015-02-18 DIAGNOSIS — K219 Gastro-esophageal reflux disease without esophagitis: Secondary | ICD-10-CM | POA: Diagnosis present

## 2015-02-18 DIAGNOSIS — R55 Syncope and collapse: Secondary | ICD-10-CM | POA: Diagnosis not present

## 2015-02-18 DIAGNOSIS — I10 Essential (primary) hypertension: Secondary | ICD-10-CM | POA: Diagnosis present

## 2015-02-18 DIAGNOSIS — F419 Anxiety disorder, unspecified: Secondary | ICD-10-CM | POA: Diagnosis present

## 2015-02-18 DIAGNOSIS — Z9079 Acquired absence of other genital organ(s): Secondary | ICD-10-CM | POA: Diagnosis present

## 2015-02-18 DIAGNOSIS — F411 Generalized anxiety disorder: Secondary | ICD-10-CM | POA: Diagnosis not present

## 2015-02-18 DIAGNOSIS — S82201S Unspecified fracture of shaft of right tibia, sequela: Secondary | ICD-10-CM | POA: Diagnosis not present

## 2015-02-18 DIAGNOSIS — F10230 Alcohol dependence with withdrawal, uncomplicated: Secondary | ICD-10-CM | POA: Diagnosis not present

## 2015-02-18 DIAGNOSIS — Z885 Allergy status to narcotic agent status: Secondary | ICD-10-CM | POA: Diagnosis not present

## 2015-02-18 DIAGNOSIS — I82441 Acute embolism and thrombosis of right tibial vein: Secondary | ICD-10-CM | POA: Diagnosis not present

## 2015-02-18 DIAGNOSIS — S82201D Unspecified fracture of shaft of right tibia, subsequent encounter for closed fracture with routine healing: Secondary | ICD-10-CM | POA: Diagnosis not present

## 2015-02-18 DIAGNOSIS — F1020 Alcohol dependence, uncomplicated: Secondary | ICD-10-CM | POA: Diagnosis present

## 2015-02-18 DIAGNOSIS — S50312D Abrasion of left elbow, subsequent encounter: Secondary | ICD-10-CM | POA: Diagnosis not present

## 2015-02-18 DIAGNOSIS — Z79891 Long term (current) use of opiate analgesic: Secondary | ICD-10-CM | POA: Diagnosis not present

## 2015-02-18 DIAGNOSIS — S82291A Other fracture of shaft of right tibia, initial encounter for closed fracture: Secondary | ICD-10-CM | POA: Diagnosis not present

## 2015-02-18 DIAGNOSIS — Z882 Allergy status to sulfonamides status: Secondary | ICD-10-CM | POA: Diagnosis not present

## 2015-02-18 DIAGNOSIS — L089 Local infection of the skin and subcutaneous tissue, unspecified: Secondary | ICD-10-CM | POA: Diagnosis not present

## 2015-02-18 DIAGNOSIS — I82401 Acute embolism and thrombosis of unspecified deep veins of right lower extremity: Secondary | ICD-10-CM | POA: Diagnosis not present

## 2015-02-18 DIAGNOSIS — W19XXXA Unspecified fall, initial encounter: Secondary | ICD-10-CM | POA: Diagnosis present

## 2015-02-18 DIAGNOSIS — Z8673 Personal history of transient ischemic attack (TIA), and cerebral infarction without residual deficits: Secondary | ICD-10-CM | POA: Diagnosis not present

## 2015-02-18 DIAGNOSIS — F10231 Alcohol dependence with withdrawal delirium: Secondary | ICD-10-CM | POA: Diagnosis not present

## 2015-02-18 LAB — CBC WITH DIFFERENTIAL/PLATELET
Basophils Absolute: 0 10*3/uL (ref 0.0–0.1)
Basophils Relative: 0 % (ref 0–1)
EOS ABS: 0.1 10*3/uL (ref 0.0–0.7)
Eosinophils Relative: 1 % (ref 0–5)
HEMATOCRIT: 33.4 % — AB (ref 39.0–52.0)
Hemoglobin: 11.3 g/dL — ABNORMAL LOW (ref 13.0–17.0)
LYMPHS ABS: 0.9 10*3/uL (ref 0.7–4.0)
Lymphocytes Relative: 11 % — ABNORMAL LOW (ref 12–46)
MCH: 33 pg (ref 26.0–34.0)
MCHC: 33.8 g/dL (ref 30.0–36.0)
MCV: 97.7 fL (ref 78.0–100.0)
MONO ABS: 0.6 10*3/uL (ref 0.1–1.0)
Monocytes Relative: 8 % (ref 3–12)
Neutro Abs: 6.3 10*3/uL (ref 1.7–7.7)
Neutrophils Relative %: 80 % — ABNORMAL HIGH (ref 43–77)
PLATELETS: 122 10*3/uL — AB (ref 150–400)
RBC: 3.42 MIL/uL — ABNORMAL LOW (ref 4.22–5.81)
RDW: 12.8 % (ref 11.5–15.5)
WBC: 7.8 10*3/uL (ref 4.0–10.5)

## 2015-02-18 LAB — TROPONIN I
Troponin I: <0.03 ng/mL (ref <0.031)
Troponin I: <0.03 ng/mL (ref <0.031)
Troponin I: <0.03 ng/mL (ref <0.031)

## 2015-02-18 LAB — COMPREHENSIVE METABOLIC PANEL
ALT: 16 U/L — ABNORMAL LOW (ref 17–63)
AST: 24 U/L (ref 15–41)
Albumin: 2.7 g/dL — ABNORMAL LOW (ref 3.5–5.0)
Alkaline Phosphatase: 48 U/L (ref 38–126)
Anion gap: 9 (ref 5–15)
BUN: 10 mg/dL (ref 6–20)
CO2: 21 mmol/L — AB (ref 22–32)
CREATININE: 1.17 mg/dL (ref 0.61–1.24)
Calcium: 7.7 mg/dL — ABNORMAL LOW (ref 8.9–10.3)
Chloride: 104 mmol/L (ref 101–111)
GFR calc Af Amer: >60 mL/min (ref >60)
Glucose, Bld: 103 mg/dL — ABNORMAL HIGH (ref 65–99)
Potassium: 3.9 mmol/L (ref 3.5–5.1)
Sodium: 134 mmol/L — ABNORMAL LOW (ref 135–145)
Total Bilirubin: 0.7 mg/dL (ref 0.3–1.2)
Total Protein: 5.6 g/dL — ABNORMAL LOW (ref 6.5–8.1)

## 2015-02-18 LAB — PROTIME-INR
INR: 1.15 (ref 0.00–1.49)
PROTHROMBIN TIME: 14.9 s (ref 11.6–15.2)

## 2015-02-18 LAB — APTT: aPTT: 27 seconds (ref 24–37)

## 2015-02-18 MED ORDER — MORPHINE SULFATE 4 MG/ML IJ SOLN
4.0000 mg | INTRAMUSCULAR | Status: DC | PRN
  Administered 2015-02-18 – 2015-02-19 (×8): 4 mg via INTRAVENOUS
  Filled 2015-02-18 (×8): qty 1

## 2015-02-18 MED ORDER — FOLIC ACID 1 MG PO TABS
1.0000 mg | ORAL_TABLET | Freq: Every day | ORAL | Status: DC
  Administered 2015-02-18 – 2015-02-19 (×2): 1 mg via ORAL
  Filled 2015-02-18 (×2): qty 1

## 2015-02-18 MED ORDER — ACETAMINOPHEN-CODEINE #3 300-30 MG PO TABS
1.0000 | ORAL_TABLET | ORAL | Status: DC | PRN
  Administered 2015-02-18: 2 via ORAL
  Filled 2015-02-18: qty 2

## 2015-02-18 MED ORDER — SODIUM CHLORIDE 0.9 % IV SOLN: INTRAVENOUS | Status: DC

## 2015-02-18 MED ORDER — ACETAMINOPHEN 325 MG PO TABS
650.0000 mg | ORAL_TABLET | Freq: Four times a day (QID) | ORAL | Status: DC | PRN
  Administered 2015-02-19: 650 mg via ORAL
  Filled 2015-02-18: qty 2

## 2015-02-18 MED ORDER — VITAMIN B-1 100 MG PO TABS
100.0000 mg | ORAL_TABLET | Freq: Every day | ORAL | Status: DC
  Administered 2015-02-18 – 2015-02-19 (×2): 100 mg via ORAL
  Filled 2015-02-18 (×3): qty 1

## 2015-02-18 MED ORDER — LORAZEPAM 1 MG PO TABS: 0.0000 mg | ORAL_TABLET | Freq: Two times a day (BID) | ORAL | Status: DC

## 2015-02-18 MED ORDER — SODIUM CHLORIDE 0.9 % IV BOLUS (SEPSIS)
1000.0000 mL | Freq: Once | INTRAVENOUS | Status: AC
  Administered 2015-02-18: 1000 mL via INTRAVENOUS

## 2015-02-18 MED ORDER — LORAZEPAM 2 MG/ML IJ SOLN: 1.0000 mg | Freq: Four times a day (QID) | INTRAMUSCULAR | Status: DC | PRN

## 2015-02-18 MED ORDER — ENOXAPARIN SODIUM 40 MG/0.4ML ~~LOC~~ SOLN
40.0000 mg | SUBCUTANEOUS | Status: DC
  Administered 2015-02-18 – 2015-02-19 (×2): 40 mg via SUBCUTANEOUS
  Filled 2015-02-18 (×2): qty 0.4

## 2015-02-18 MED ORDER — HYDROCODONE-ACETAMINOPHEN 5-325 MG PO TABS
1.0000 | ORAL_TABLET | ORAL | Status: DC | PRN
  Filled 2015-02-18: qty 2

## 2015-02-18 MED ORDER — ADULT MULTIVITAMIN W/MINERALS CH
1.0000 | ORAL_TABLET | Freq: Every day | ORAL | Status: DC
  Administered 2015-02-18 – 2015-02-19 (×2): 1 via ORAL
  Filled 2015-02-18 (×2): qty 1

## 2015-02-18 MED ORDER — ASPIRIN 81 MG PO TBEC: 325.0000 mg | DELAYED_RELEASE_TABLET | Freq: Every day | ORAL | Status: DC

## 2015-02-18 MED ORDER — MORPHINE SULFATE 4 MG/ML IJ SOLN
2.0000 mg | Freq: Once | INTRAMUSCULAR | Status: AC
  Administered 2015-02-18: 2 mg via INTRAVENOUS
  Filled 2015-02-18: qty 1

## 2015-02-18 MED ORDER — SODIUM CHLORIDE 0.9 % IV SOLN
INTRAVENOUS | Status: AC
  Administered 2015-02-18: 100 mL/h via INTRAVENOUS

## 2015-02-18 MED ORDER — TRAZODONE HCL 50 MG PO TABS: 50.0000 mg | ORAL_TABLET | Freq: Every evening | ORAL | Status: DC | PRN

## 2015-02-18 MED ORDER — METOPROLOL TARTRATE 50 MG PO TABS
50.0000 mg | ORAL_TABLET | Freq: Two times a day (BID) | ORAL | Status: DC
  Administered 2015-02-18 – 2015-02-19 (×4): 50 mg via ORAL
  Filled 2015-02-18 (×4): qty 1

## 2015-02-18 MED ORDER — SIMVASTATIN 20 MG PO TABS
20.0000 mg | ORAL_TABLET | Freq: Every day | ORAL | Status: DC
  Administered 2015-02-18 – 2015-02-19 (×2): 20 mg via ORAL
  Filled 2015-02-18 (×2): qty 1

## 2015-02-18 MED ORDER — MORPHINE SULFATE 2 MG/ML IJ SOLN
2.0000 mg | INTRAMUSCULAR | Status: DC | PRN
  Administered 2015-02-18 (×2): 2 mg via INTRAVENOUS
  Filled 2015-02-18 (×2): qty 1

## 2015-02-18 MED ORDER — PANTOPRAZOLE SODIUM 40 MG PO TBEC
40.0000 mg | DELAYED_RELEASE_TABLET | Freq: Every day | ORAL | Status: DC
  Administered 2015-02-18 – 2015-02-19 (×2): 40 mg via ORAL
  Filled 2015-02-18 (×2): qty 1

## 2015-02-18 MED ORDER — LORAZEPAM 1 MG PO TABS: 0.0000 mg | ORAL_TABLET | Freq: Four times a day (QID) | ORAL | Status: DC

## 2015-02-18 MED ORDER — FLUOXETINE HCL 20 MG PO CAPS
20.0000 mg | ORAL_CAPSULE | Freq: Every day | ORAL | Status: DC
  Administered 2015-02-18 – 2015-02-19 (×2): 20 mg via ORAL
  Filled 2015-02-18 (×2): qty 1

## 2015-02-18 MED ORDER — LISINOPRIL 40 MG PO TABS
40.0000 mg | ORAL_TABLET | Freq: Every day | ORAL | Status: DC
  Administered 2015-02-18 – 2015-02-19 (×2): 40 mg via ORAL
  Filled 2015-02-18 (×2): qty 1

## 2015-02-18 MED ORDER — ACETAMINOPHEN 650 MG RE SUPP: 650.0000 mg | Freq: Four times a day (QID) | RECTAL | Status: DC | PRN

## 2015-02-18 MED ORDER — LORAZEPAM 1 MG PO TABS: 1.0000 mg | ORAL_TABLET | Freq: Four times a day (QID) | ORAL | Status: DC | PRN

## 2015-02-18 MED ORDER — ASPIRIN EC 325 MG PO TBEC
325.0000 mg | DELAYED_RELEASE_TABLET | Freq: Every day | ORAL | Status: DC
  Administered 2015-02-18 – 2015-02-19 (×2): 325 mg via ORAL
  Filled 2015-02-18 (×2): qty 1

## 2015-02-18 MED ORDER — THIAMINE HCL 100 MG/ML IJ SOLN: 100.0000 mg | Freq: Every day | INTRAMUSCULAR | Status: DC

## 2015-02-18 NOTE — ED Provider Notes (Signed)
The patient is a 59 year old male, He has a history of a stroke affecting his right side, he presents to the hospital after having a fall when he became weak striking his right leg. On exam he has tenderness with palpation over the proximal leg, he became very diaphoretic and had a syncopal episode in the radiology department. Heart and lungs are normal, abdomen is soft and nontender, pulses are normal at the foot. He already has weakness on the right side secondary to the prior stroke.  X-rays reveal that he has a broken tibia, I discussed his care with orthopedist Dr. Rhona Raider who has recommended a long-leg immobilizer and follow-up in the office, this is likely nonsurgical. Because of the patient's hyponatremia and syncope he will be admitted to the hospitalist service. They will see the patient in the emergency department.  D/w [name] who will admit   EKG Interpretation  Date/Time:  Wednesday February 17 2015 23:03:27 EDT Ventricular Rate:  68 PR Interval:  177 QRS Duration: 99 QT Interval:  429 QTC Calculation: 456 R Axis:   76 Text Interpretation:  Sinus rhythm Normal ECG Since last tracing rate slower Confirmed by Tanysha Quant  MD, Abriella Filkins ([id number]) on 02/17/2015 11:54:02 PM      Medical screening examination/treatment/procedure(s) were conducted as a shared visit with non-physician practitioner(s) and myself.  I personally evaluated the patient during the encounter.  Clinical Impression:   Final diagnoses:  Exertional dyspnea  Syncope  Fracture of tibia, right, closed, initial encounter  Hyponatremia         Noemi Chapel, MD 03/12/15 [phone]

## 2015-02-18 NOTE — Evaluation (Signed)
Physical Therapy Evaluation Patient Details Name: Tanner [name] MRN: [id number] DOB: 1956-04-15 Today's Date: 02/18/2015   History of Present Illness  Fracture of tibia, right, closed  Clinical Impression  Patient able to progress to standing at edge of bed with +1 moderate assist. Patient maintaining NWB while standing. While standing patient reported increasing dizziness to point where return to supine was required. Anticipate patient will be able to D/C home with assistance as mobility tolerance improves. Will continue to follow to advance mobility for D/C.      Follow Up Recommendations Home health PT    Equipment Recommendations  None recommended by PT;Other (comment) (patient reports having equipment)    Recommendations for Other Services       Precautions / Restrictions Precautions Precautions: Fall Required Braces or Orthoses: Knee Immobilizer - Right Knee Immobilizer - Right: On at all times Restrictions Weight Bearing Restrictions: Yes RLE Weight Bearing:  (not clarified at this time)      Mobility  Bed Mobility Overal bed mobility: Needs Assistance Bed Mobility: Supine to Sit;Sit to Supine     Supine to sit: Mod assist (RLE) Sit to supine: Mod assist   General bed mobility comments: RLE  Transfers Overall transfer level: Needs assistance Equipment used: Rolling walker (2 wheeled) Transfers: Sit to/from Stand Sit to Stand: Min assist         General transfer comment: while standing patient reports feeling lightheaded and required return to supine. Able to stand edge of bed with rw X approx. 2 minutes.   Ambulation/Gait                Stairs            Wheelchair Mobility    Modified Rankin (Stroke Patients Only)       Balance Overall balance assessment: Needs assistance Sitting-balance support: Single extremity supported Sitting balance-Leahy Scale: Poor     Standing balance support: Bilateral upper extremity supported Standing  balance-Leahy Scale: Poor                               Pertinent Vitals/Pain Pain Assessment: 0-10 Pain Score: 10-Worst pain ever Pain Location: Rt leg Pain Descriptors / Indicators: Sharp Pain Intervention(s): Monitored during session    Home Living Family/patient expects to be discharged to:: Private residence Living Arrangements: Children Available Help at Discharge: Family;Friend(s) Type of Home: Mobile home Home Access: Stairs to enter Entrance Stairs-Rails: Right Entrance Stairs-Number of Steps: 4 Home Layout: One level Home Equipment: Environmental consultant - 2 wheels;Cane - quad;Crutches      Prior Function Level of Independence: Independent with assistive device(s)         Comments: cane     Hand Dominance        Extremity/Trunk Assessment               Lower Extremity Assessment: RLE deficits/detail RLE Deficits / Details: limited strength, reports due to pain       Communication   Communication: No difficulties  Cognition Arousal/Alertness: Awake/alert Behavior During Therapy: WFL for tasks assessed/performed Overall Cognitive Status: Within Functional Limits for tasks assessed                      General Comments General comments (skin integrity, edema, etc.): Unable to attempt ambulation as patient reporting increasing dizziness with standing. Required return to supine. Encouraged patient to get up to chair with nursing in evening.  Exercises        Assessment/Plan    PT Assessment Patient needs continued PT services  PT Diagnosis Difficulty walking;Generalized weakness;Acute pain   PT Problem List Decreased strength;Decreased range of motion;Decreased activity tolerance;Decreased balance;Decreased mobility  PT Treatment Interventions DME instruction;Gait training;Stair training;Functional mobility training;Therapeutic activities;Therapeutic exercise;Balance training;Patient/family education   PT Goals (Current goals can be  found in the Care Plan section) Acute Rehab PT Goals Patient Stated Goal: Move without so much pain PT Goal Formulation: With patient Time For Goal Achievement: 03/04/15 Potential to Achieve Goals: Good    Frequency Min 4X/week   Barriers to discharge        Co-evaluation               End of Session Equipment Utilized During Treatment: Gait belt;Right knee immobilizer Activity Tolerance: Other (comment) (limited by patient reports of increasing dizziness. ) Patient left: in bed;with call bell/phone within reach;with family/visitor present Nurse Communication: Mobility status    Functional Assessment Tool Used: clinical judgment Functional Limitation: Mobility: Walking and moving around Mobility: Walking and Moving Around Current Status [phone]): At least 40 percent but less than 60 percent impaired, limited or restricted Mobility: Walking and Moving Around Goal Status [phone]): At least 20 percent but less than 40 percent impaired, limited or restricted    Time: 1343-1406 PT Time Calculation (min) (ACUTE ONLY): 23 min   Charges:   PT Evaluation $Initial PT Evaluation Tier I: 1 Procedure PT Treatments $Therapeutic Activity: 8-22 mins   PT G Codes:   PT G-Codes **NOT FOR INPATIENT CLASS** Functional Assessment Tool Used: clinical judgment Functional Limitation: Mobility: Walking and moving around Mobility: Walking and Moving Around Current Status ([id number]): At least 40 percent but less than 60 percent impaired, limited or restricted Mobility: Walking and Moving Around Goal Status [phone]): At least 20 percent but less than 40 percent impaired, limited or restricted    Cassell Clement, PT, [place] Pager [phone] Office [phone]  02/18/2015, 2:24 PM

## 2015-02-18 NOTE — Progress Notes (Signed)
Called reported.

## 2015-02-18 NOTE — Progress Notes (Signed)
RN received call back concerning patients admission order set. RN informed Md will be up to see the patient as soon as possible. Nursing will continue to monitor.

## 2015-02-18 NOTE — Consult Note (Signed)
Melrose Nakayama, MD  Chauncey Cruel, PA-C  Loni Dolly, PA-C                                  [facility]                [address] [place], [place]  [address]   ORTHOPAEDIC CONSULTATION  Tanner [name]            MRN:  [id number] DOB/SEX:  [birth year]/male     CHIEF COMPLAINT:  Painful right leg  HISTORY: Tanner [name] a 59 y.o. male with history of CVA affecting right side who fell and sustained nondisplaced proximal tibia fracture.  Normally walks without appliances but slowly.  Denies pain elsewhere.  Admitted to medical team with hyponatremia and dizziness.  ORS consulted. Placed in knee immobilizer by EDP after phone consult last night. Known to me through ankle ORIF a few years ago.  Goes by name of Tanner [name].    PAST MEDICAL HISTORY: Patient Active Problem List   Diagnosis Date Noted  . Tibial fracture 02/18/2015  . Syncope 02/18/2015  . Essential hypertension 02/18/2015  . History of stroke 02/18/2015  . Fracture of tibia, right, closed   . C. difficile colitis 09/11/2013  . UTI (urinary tract infection) 09/11/2013  . Essential hypertension, benign 09/11/2013  . Ileus 09/10/2013  . Spastic hemiplegia affecting dominant side 09/09/2013  . Ileus, postoperative 09/09/2013  . Hypokalemia 09/09/2013  . Nonspecific (abnormal) findings on radiological and other examination of gastrointestinal tract 09/09/2013  . CVA (cerebral infarction) 09/05/2013  . 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 Malignant neoplasm of prostate 08/29/2013  . Prostate cancer 05/22/2013  . Hyponatremia 12/11/2012    Class: Chronic  . Ankle fracture 12/10/2012   Past Medical History  Diagnosis Date  . Alcoholic hepatitis   . Hypertension   . 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 Shortness of breath     new onset- occasionally-at rest and exertion  . GERD (gastroesophageal reflux disease)   . Anxiety   . TIA (transient ischemic attack) 08/2013  . Arthritis     " in my  spine "  . Ileus 09/2013   Past Surgical History  Procedure Laterality Date  . Hand surgery Right   . Hernia repair    . Orif ankle fracture Right 12/10/2012    Procedure: OPEN REDUCTION INTERNAL FIXATION (ORIF) ANKLE FRACTURE;  Surgeon: Hessie Dibble, MD;  Location: [facility];  Service: Orthopedics;  Laterality: Right;  . Robot assisted laparoscopic radical prostatectomy N/A 08/29/2013    Procedure: ROBOTIC ASSISTED LAPAROSCOPIC RADICAL PROSTATECTOMY LEVEL 2, Lysis of adhesions;  Surgeon: Molli Hazard, MD;  Location: [facility];  Service: Urology;  Laterality: N/A;  . Lymphadenectomy Bilateral 08/29/2013    Procedure: LYMPHADENECTOMY "BILATERAL PELVIC LYMPH NODE DISSECTION";  Surgeon: Molli Hazard, MD;  Location: [facility];  Service: Urology;  Laterality: Bilateral;     MEDICATIONS:   Current facility-administered medications:  .  0.9 %  sodium chloride infusion, , Intravenous, Continuous, Rise Patience, MD, Last Rate: 100 mL/hr at 02/18/15 0430, 100 mL/hr at 02/18/15 0430 .  acetaminophen (TYLENOL) tablet 650 mg, 650 mg, Oral, Q6H PRN **OR** acetaminophen (TYLENOL) suppository 650 mg, 650 mg, Rectal, Q6H PRN, Rise Patience, MD .  acetaminophen-codeine (TYLENOL #3) 300-30 MG per tablet 1-2 tablet, 1-2 tablet, Oral, Q4H PRN, Eugenie Filler, MD .  aspirin EC tablet 325 mg, 325 mg, Oral, Daily, Rise Patience, MD, 325 mg at 02/18/15 0856 .  enoxaparin (LOVENOX) injection 40 mg, 40 mg, Subcutaneous, Q24H, Rise Patience, MD, 40 mg at 02/18/15 0856 .  FLUoxetine (PROZAC) capsule 20 mg, 20 mg, Oral, Daily, Rise Patience, MD, 20 mg at 02/18/15 0855 .  folic acid (FOLVITE) tablet 1 mg, 1 mg, Oral, Daily, Rise Patience, MD, 1 mg at 02/18/15 1000 .  lisinopril (PRINIVIL,ZESTRIL) tablet 40 mg, 40 mg, Oral, Daily, Rise Patience, MD, 40 mg at 02/18/15 0856 .  LORazepam (ATIVAN) tablet 1 mg, 1 mg, Oral, Q6H PRN **OR** LORazepam (ATIVAN) injection 1 mg, 1  mg, Intravenous, Q6H PRN, Rise Patience, MD .  LORazepam (ATIVAN) tablet 0-4 mg, 0-4 mg, Oral, Q6H, 0 mg at 02/18/15 0532 **FOLLOWED BY** [START ON 02/20/2015] LORazepam (ATIVAN) tablet 0-4 mg, 0-4 mg, Oral, Q12H, Rise Patience, MD .  metoprolol (LOPRESSOR) tablet 50 mg, 50 mg, Oral, BID, Rise Patience, MD, 50 mg at 02/18/15 0855 .  morphine 4 MG/ML injection 4 mg, 4 mg, Intravenous, Q3H PRN, Eugenie Filler, MD, 4 mg at 02/18/15 1412 .  multivitamin with minerals tablet 1 tablet, 1 tablet, Oral, Daily, Rise Patience, MD, 1 tablet at 02/18/15 0855 .  pantoprazole (PROTONIX) EC tablet 40 mg, 40 mg, Oral, Daily, Rise Patience, MD, 40 mg at 02/18/15 0855 .  simvastatin (ZOCOR) tablet 20 mg, 20 mg, Oral, q1800, Rise Patience, MD .  thiamine (VITAMIN B-1) tablet 100 mg, 100 mg, Oral, Daily, 100 mg at 02/18/15 0855 **OR** thiamine (B-1) injection 100 mg, 100 mg, Intravenous, Daily, Rise Patience, MD .  traZODone (DESYREL) tablet 50 mg, 50 mg, Oral, QHS PRN, Rise Patience, MD  ALLERGIES:   Allergies  Allergen Reactions  . Dilaudid [Hydromorphone Hcl] Nausea And Vomiting    Pre pt history  . Oxycodone Nausea And Vomiting  . Procaine Hcl Other (See Comments)    Patient states he is allergic to novicaine  . Zofran [Ondansetron] Nausea And Vomiting  . Sulfa Antibiotics Rash    REVIEW OF SYSTEMS: REVIEWED IN DETAIL IN CHART  FAMILY HISTORY:   Family History  Problem Relation Age of Onset  . Pancreatic cancer Sister   . Lung cancer Father     SOCIAL HISTORY:   Social History  Substance Use Topics  . Smoking status: Former Smoker -- 1.50 packs/day for 20 years    Quit date: 12/21/2012  . Smokeless tobacco: Never Used  . Alcohol Use: 0.0 oz/week     Comment: 5 beers day     EXAMINATION: Vital signs in last 24 hours: Temp:  [98.2 F (36.8 C)-98.6 F (37 C)] 98.6 F (37 C) (08/11 1500) Pulse Rate:  [65-79] 73 (08/11 1500) Resp:  [12-20]  20 (08/11 1500) BP: (94-147)/(62-85) 119/69 mmHg (08/11 1500) SpO2:  [94 %-99 %] 97 % (08/11 1500)  BP 119/69 mmHg  Pulse 73  Temp(Src) 98.6 F (37 C) (Oral)  Resp 20  SpO2 97%  General Appearance:    Alert, cooperative, no distress, appears stated age  Head:    Normocephalic, without obvious abnormality, atraumatic  Eyes:    PERRL, conjunctiva/corneas clear, EOM's intact, fundi    benign, both eyes       Ears:    Normal TM's and external ear canals, both ears  Nose:   Nares normal, septum midline, mucosa normal, no drainage    or  sinus tenderness  Throat:   Lips, mucosa, and tongue normal; teeth and gums normal  Neck:   Supple, symmetrical, trachea midline, no adenopathy;       thyroid:  No enlargement/tenderness/nodules; no carotid   bruit or JVD  Back:     Symmetric, no curvature, ROM normal, no CVA tenderness  Lungs:     Clear to auscultation bilaterally, respirations unlabored  Chest wall:    No tenderness or deformity  Heart:    Regular rate and rhythm, S1 and S2 normal, no murmur, rub   or gallop  Abdomen:     Soft, non-tender, bowel sounds active all four quadrants,    no masses, no organomegaly  Genitalia:    Rectal:    Extremities:   Extremities normal, atraumatic, no cyanosis or edema  Pulses:   2+ and symmetric all extremities  Skin:   Skin color, texture, turgor normal, no rashes or lesions  Lymph nodes:   Cervical, supraclavicular, and axillary nodes normal  Neurologic:   CNII-XII intact. Normal strength, sensation and reflexes      throughout    Musculoskeletal Exam:   Right leg tender to ROM. Palp pulses. Skin OK   DIAGNOSTIC STUDIES: Recent laboratory studies:  Recent Labs  02/17/15 2246 02/18/15 0415  WBC 6.7 7.8  HGB 13.3 11.3*  HCT 39.3 33.4*  PLT 166 122*    Recent Labs  02/17/15 2246 02/18/15 0415  NA 127* 134*  K 4.7 3.9  CL 96* 104  CO2 19* 21*  BUN 13 10  CREATININE 1.32* 1.17  GLUCOSE 91 103*  CALCIUM 8.4* 7.7*   Lab Results   Component Value Date   INR 1.15 02/17/2015   INR 1.01 09/02/2013     Recent Radiographic Studies :  Dg Tibia/fibula Right  02/17/2015   CLINICAL DATA:  Further evaluation of known tibial fracture. Initial encounter.  EXAM: RIGHT TIBIA AND FIBULA - 2 VIEW  COMPARISON:  Right tibia/fibula radiographs performed 12/09/2012  FINDINGS: As noted on recent prior studies, there is a comminuted oblique slightly displaced fracture extending across the proximal tibial metadiaphysis. An associated moderate knee joint effusion is noted. There is also a minimally displaced fracture involving the proximal fibula.  Distal fibular hardware appears grossly intact, without evidence of loosening. Distal tibial screws are also grossly unremarkable. Lateral soft tissue swelling is noted at the ankle. The ankle mortise is grossly unremarkable.  IMPRESSION: 1. Comminuted oblique slightly displaced fracture extending across the proximal tibial metadiaphysis, with associated moderate knee joint effusion. 2. Minimally displaced fracture involving the proximal fibula.   Electronically Signed   By: Garald Balding M.D.   On: 02/17/2015 23:24   Ct Head Wo Contrast  02/18/2015   CLINICAL DATA:  Syncopal episode earlier today. Similar episode 2 weeks prior  EXAM: CT HEAD WITHOUT CONTRAST  TECHNIQUE: Contiguous axial images were obtained from the base of the skull through the vertex without intravenous contrast.  COMPARISON:  Head CT September 06, 2013.  Brain MRI September 02, 2013  FINDINGS: Mild generalized atrophy is stable. There is no demonstrable mass, hemorrhage, extra-axial fluid collection, or midline shift. There is decreased attenuation in the inferior left centrum semiovale with extension inferiorly to the posterior aspect of the posterior limb of the left internal capsule consistent with prior infarct. Elsewhere, there is minimal periventricular small vessel disease. No acute infarct appreciable.  : Bony calvarium appears  intact.  The mastoid air cells are clear.  IMPRESSION: Atrophy with prior infarct  involving the inferior left centrum semiovale extending inferiorly into the posterior aspect of the posterior limb of the left internal capsule. There is minimal periventricular small vessel disease. No acute appearing infarct. No mass or hemorrhage.   Electronically Signed   By: Lowella Grip III M.D.   On: 02/18/2015 01:02   Dg Chest Port 1 View  02/18/2015   CLINICAL DATA:  Acute onset of exertional dyspnea. Diaphoresis. Near syncope. Initial encounter.  EXAM: PORTABLE CHEST - 1 VIEW  COMPARISON:  Chest radiograph performed 09/10/2013  FINDINGS: The lungs are well-aerated. Minimal left basilar opacity likely reflects atelectasis. There is no evidence of pleural effusion or pneumothorax.  The cardiomediastinal silhouette is within normal limits. No acute osseous abnormalities are seen.  IMPRESSION: Minimal left basilar opacity likely reflects atelectasis. Lungs otherwise clear.   Electronically Signed   By: Garald Balding M.D.   On: 02/18/2015 00:23   Dg Knee Complete 4 Views Right  02/17/2015   CLINICAL DATA:  Pain following fall earlier today  EXAM: RIGHT KNEE - COMPLETE 4+ VIEW  COMPARISON:  June 30, 2013  FINDINGS: Frontal, lateral, and bilateral oblique views were obtained. There is a comminuted fracture of the proximal tibia with fractures extending from the lateral aspect of the medial tibial plateau into the proximal tibial diaphysis. There also fractures in the proximal tibial metaphysis and medial tibial diaphysis. Alignment in these areas of fracture are near anatomic. No dislocation. There is a fat - fluid level in the suprapatellar bursa consistent with the fractures. No appreciable joint space narrowing.  IMPRESSION: Comminuted fractures of the proximal tibia with major fracture fragments in overall near anatomic alignment. Fat-fluid level in the suprapatellar bursa consistent with the fractures. No  dislocation. No appreciable joint space narrowing.   Electronically Signed   By: Lowella Grip III M.D.   On: 02/17/2015 21:20    ASSESSMENT:  Right proximal tibia fracture   PLAN:  Should heal well closed with use of knee brace but needs to remain NWB for up to two months.  Needs followup with me in about two weeks to repeat xrays.  If shifts might still need surgery.  Kaleea Penner G 02/18/2015, 3:27 PM

## 2015-02-18 NOTE — Progress Notes (Signed)
I have seen and assessed patient and agree with Dr. Moise Boring assessment and plan. Patient complaining of significant pain in his right lower extremity and unable to raise his right lower extremity. Will consult with orthopedics for further evaluation and management.

## 2015-02-18 NOTE — Progress Notes (Signed)
Md paged for order set.

## 2015-02-18 NOTE — H&P (Signed)
[facility] History and Physical  Ean Gettel DXI:[id number] DOB: 03-31-1956 DOA: 02/17/2015  Referring physician: ER physician. PCP: Criselda Peaches, MD  Specialists: None.  Chief Complaint: Right lower extremity pain. Brief episode of loss of consciousness in the ER.  HPI: Tanner [name] is a 59 y.o. male with history of alcoholism, hypertension, prostate cancer status post radical prostatectomy, history of CVA with right-sided weakness had a fall at his house when he was trying to open the door. Patient had sustained injury to his right lower extremity. X-rays reveal fractures of the right tibia and fibula and on call orthopedic surgeon Dr.Daldorf was consulted and orthopedic surgeon at this time is advised nonsurgical management with knee immobilizer and follow-up with him. While in the ER patient was having a CT scan done when patient suddenly lost consciousness briefly with diaphoresis as per the ER physician. Patient otherwise denies any palpitations chest pain. EKG shows normal sinus rhythm. Patient has been admitted for observation.  Review of Systems: As presented in the history of presenting illness, rest negative.  Past Medical History  Diagnosis Date  . Alcoholic hepatitis   . Hypertension   . 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 Shortness of breath     new onset- occasionally-at rest and exertion  . GERD (gastroesophageal reflux disease)   . Anxiety   . TIA (transient ischemic attack) 08/2013  . Arthritis     " in my spine "  . Ileus 09/2013   Past Surgical History  Procedure Laterality Date  . Hand surgery Right   . Hernia repair    . Orif ankle fracture Right 12/10/2012    Procedure: OPEN REDUCTION INTERNAL FIXATION (ORIF) ANKLE FRACTURE;  Surgeon: Hessie Dibble, MD;  Location: [facility];  Service: Orthopedics;  Laterality: Right;  . Robot assisted laparoscopic radical prostatectomy N/A 08/29/2013    Procedure: ROBOTIC ASSISTED LAPAROSCOPIC RADICAL  PROSTATECTOMY LEVEL 2, Lysis of adhesions;  Surgeon: Molli Hazard, MD;  Location: [facility];  Service: Urology;  Laterality: N/A;  . Lymphadenectomy Bilateral 08/29/2013    Procedure: LYMPHADENECTOMY "BILATERAL PELVIC LYMPH NODE DISSECTION";  Surgeon: Molli Hazard, MD;  Location: [facility];  Service: Urology;  Laterality: Bilateral;   Social History:  reports that he quit smoking about 2 years ago. He has never used smokeless tobacco. He reports that he drinks alcohol. He reports that he uses illicit drugs. Where does patient live home. Can patient participate in ADLs? Yes.  Allergies  Allergen Reactions  . Dilaudid [Hydromorphone Hcl] Nausea And Vomiting    Pre pt history  . Oxycodone Nausea And Vomiting  . Procaine Hcl Other (See Comments)    Patient states he is allergic to novicaine  . Zofran [Ondansetron] Nausea And Vomiting  . Sulfa Antibiotics Rash    Family History:  Family History  Problem Relation Age of Onset  . Pancreatic cancer Sister   . Lung cancer Father       Prior to Admission medications   Medication Sig Start Date End Date Taking? Authorizing Provider  aspirin 81 MG EC tablet Take 4 tablets (325 mg total) by mouth daily. 09/30/13  Yes Theodis Blaze, MD  Aspirin-Acetaminophen-Caffeine (GOODYS EXTRA STRENGTH) [id number] MG TABS Take 1 Package by mouth daily as needed (for pain).   Yes Historical Provider, MD  clonazePAM (KLONOPIN) 0.5 MG tablet Take 1 tablet (0.5 mg total) by mouth 3 (three) times daily as needed for anxiety. 09/30/13  Yes Theodis Blaze,  MD  FLUoxetine (PROZAC) 20 MG capsule Take 1 capsule (20 mg total) by mouth daily. 09/30/13  Yes Theodis Blaze, MD  lisinopril (PRINIVIL,ZESTRIL) 40 MG tablet Take 40 mg by mouth daily.   Yes Historical Provider, MD  metoprolol (LOPRESSOR) 50 MG tablet Take 50 mg by mouth 2 (two) times daily.   Yes Historical Provider, MD  omeprazole (PRILOSEC) 40 MG capsule Take 40 mg by mouth 2 (two) times daily.    Yes  Historical Provider, MD  simvastatin (ZOCOR) 20 MG tablet Take 1 tablet (20 mg total) by mouth daily at 6 PM. 09/30/13  Yes Theodis Blaze, MD  ciprofloxacin (CIPRO) 500 MG tablet Take 1 tablet (500 mg total) by mouth 2 (two) times daily. Patient not taking: Reported on 02/18/2015 09/30/13   Theodis Blaze, MD  HYDROcodone-acetaminophen (NORCO/VICODIN) 5-325 MG per tablet Take 1 tablet by mouth every 6 (six) hours as needed for moderate pain. Patient not taking: Reported on 02/18/2015 09/30/13   Theodis Blaze, MD  hyoscyamine (LEVSIN, ANASPAZ) 0.125 MG tablet Take 1 tablet (0.125 mg total) by mouth every 4 (four) hours as needed (bladder spasms). Patient not taking: Reported on 02/18/2015 08/29/13   Rolan Bucco, MD  lisinopril (PRINIVIL,ZESTRIL) 10 MG tablet Take 1 tablet (10 mg total) by mouth every morning. Patient not taking: Reported on 02/18/2015 09/30/13   Theodis Blaze, MD  metoprolol (LOPRESSOR) 25 MG tablet Take 1 tablet (25 mg total) by mouth 2 (two) times daily. Patient not taking: Reported on 02/18/2015 09/30/13   Theodis Blaze, MD  morphine (MSIR) 15 MG tablet Take 1 tablet (15 mg total) by mouth every 4 (four) hours as needed for moderate pain or severe pain. Patient not taking: Reported on 02/18/2015 09/30/13   Theodis Blaze, MD  traZODone (DESYREL) 50 MG tablet Take 1 tablet (50 mg total) by mouth at bedtime as needed for sleep. 09/30/13   Theodis Blaze, MD  vancomycin (VANCOCIN) 50 mg/mL oral solution Take 5 mLs (250 mg total) by mouth every 6 (six) hours. Stop date March 27th, 2015 Patient not taking: Reported on 02/18/2015 09/30/13   Theodis Blaze, MD    Physical Exam: Filed Vitals:   02/17/15 2330 02/17/15 2345 02/18/15 0115 02/18/15 0153  BP: 127/79 120/75 147/85 143/78  Pulse: 72 75 79 79  Temp:    98.2 F (36.8 C)  TempSrc:      Resp: '12 14 19 18  '$ SpO2: 99% 94% 96% 99%     General:  Moderately built and nourished.  Eyes: Anicteric no pallor.  ENT: No discharge from the  ears eyes nose and mouth.  Neck: No mass felt. No neck rigidity.  Cardiovascular: S1-S2 heard.  Respiratory: No rhonchi or crepitations.  Abdomen: Soft nontender bowel sounds present.  Skin: No rash.  Musculoskeletal: Right lower extremity is in the knee mobilizer.  Psychiatric: Appears normal.  Neurologic: Alert awake oriented to time place and person. Moves all extremities with mild weakness in the right upper extremity and lower extremity from previous stroke.  Labs on Admission:  Basic Metabolic Panel:  Recent Labs Lab 02/17/15 2246  NA 127*  K 4.7  CL 96*  CO2 19*  GLUCOSE 91  BUN 13  CREATININE 1.32*  CALCIUM 8.4*   Liver Function Tests:  Recent Labs Lab 02/17/15 2246  AST 35  ALT 21  ALKPHOS 62  BILITOT 1.0  PROT 6.6  ALBUMIN 3.4*   No results for input(s): LIPASE,  AMYLASE in the last 168 hours. No results for input(s): AMMONIA in the last 168 hours. CBC:  Recent Labs Lab 02/17/15 2246  WBC 6.7  NEUTROABS 4.8  HGB 13.3  HCT 39.3  MCV 96.3  PLT 166   Cardiac Enzymes: No results for input(s): CKTOTAL, CKMB, CKMBINDEX, TROPONINI in the last 168 hours.  BNP (last 3 results) No results for input(s): BNP in the last 8760 hours.  ProBNP (last 3 results) No results for input(s): PROBNP in the last 8760 hours.  CBG: No results for input(s): GLUCAP in the last 168 hours.  Radiological Exams on Admission: Dg Tibia/fibula Right  02/17/2015   CLINICAL DATA:  Further evaluation of known tibial fracture. Initial encounter.  EXAM: RIGHT TIBIA AND FIBULA - 2 VIEW  COMPARISON:  Right tibia/fibula radiographs performed 12/09/2012  FINDINGS: As noted on recent prior studies, there is a comminuted oblique slightly displaced fracture extending across the proximal tibial metadiaphysis. An associated moderate knee joint effusion is noted. There is also a minimally displaced fracture involving the proximal fibula.  Distal fibular hardware appears grossly intact,  without evidence of loosening. Distal tibial screws are also grossly unremarkable. Lateral soft tissue swelling is noted at the ankle. The ankle mortise is grossly unremarkable.  IMPRESSION: 1. Comminuted oblique slightly displaced fracture extending across the proximal tibial metadiaphysis, with associated moderate knee joint effusion. 2. Minimally displaced fracture involving the proximal fibula.   Electronically Signed   By: Garald Balding M.D.   On: 02/17/2015 23:24   Ct Head Wo Contrast  02/18/2015   CLINICAL DATA:  Syncopal episode earlier today. Similar episode 2 weeks prior  EXAM: CT HEAD WITHOUT CONTRAST  TECHNIQUE: Contiguous axial images were obtained from the base of the skull through the vertex without intravenous contrast.  COMPARISON:  Head CT September 06, 2013.  Brain MRI September 02, 2013  FINDINGS: Mild generalized atrophy is stable. There is no demonstrable mass, hemorrhage, extra-axial fluid collection, or midline shift. There is decreased attenuation in the inferior left centrum semiovale with extension inferiorly to the posterior aspect of the posterior limb of the left internal capsule consistent with prior infarct. Elsewhere, there is minimal periventricular small vessel disease. No acute infarct appreciable.  : Bony calvarium appears intact.  The mastoid air cells are clear.  IMPRESSION: Atrophy with prior infarct involving the inferior left centrum semiovale extending inferiorly into the posterior aspect of the posterior limb of the left internal capsule. There is minimal periventricular small vessel disease. No acute appearing infarct. No mass or hemorrhage.   Electronically Signed   By: Lowella Grip III M.D.   On: 02/18/2015 01:02   Dg Chest Port 1 View  02/18/2015   CLINICAL DATA:  Acute onset of exertional dyspnea. Diaphoresis. Near syncope. Initial encounter.  EXAM: PORTABLE CHEST - 1 VIEW  COMPARISON:  Chest radiograph performed 09/10/2013  FINDINGS: The lungs are  well-aerated. Minimal left basilar opacity likely reflects atelectasis. There is no evidence of pleural effusion or pneumothorax.  The cardiomediastinal silhouette is within normal limits. No acute osseous abnormalities are seen.  IMPRESSION: Minimal left basilar opacity likely reflects atelectasis. Lungs otherwise clear.   Electronically Signed   By: Garald Balding M.D.   On: 02/18/2015 00:23   Dg Knee Complete 4 Views Right  02/17/2015   CLINICAL DATA:  Pain following fall earlier today  EXAM: RIGHT KNEE - COMPLETE 4+ VIEW  COMPARISON:  June 30, 2013  FINDINGS: Frontal, lateral, and bilateral oblique views were obtained.  There is a comminuted fracture of the proximal tibia with fractures extending from the lateral aspect of the medial tibial plateau into the proximal tibial diaphysis. There also fractures in the proximal tibial metaphysis and medial tibial diaphysis. Alignment in these areas of fracture are near anatomic. No dislocation. There is a fat - fluid level in the suprapatellar bursa consistent with the fractures. No appreciable joint space narrowing.  IMPRESSION: Comminuted fractures of the proximal tibia with major fracture fragments in overall near anatomic alignment. Fat-fluid level in the suprapatellar bursa consistent with the fractures. No dislocation. No appreciable joint space narrowing.   Electronically Signed   By: Lowella Grip III M.D.   On: 02/17/2015 21:20    EKG: Independently reviewed. Normal sinus rhythm.  Assessment/Plan Principal Problem:   Syncope Active Problems:   Tibial fracture   Essential hypertension   History of stroke   Fracture of tibia, right, closed   1. Syncope - probably vasovagal secondary to pain. Closely monitor in telemetry. 2. Tibia and fibula fracture of the right lower extremity - patient has been placed on knee immobilizer. Follow-up with Dr. Latanya Maudlin as outpatient. 3. History of alcohol abuse - on alcohol withdrawal  protocol. 4. Hypertension - continue present medications. 5. History of stroke on antiplatelet agents. 6. History of prostate cancer status post radical prostatectomy.  I have reviewed patient's old chart and labs.   DVT Prophylaxis Lovenox.  Code Status: Full code.  Family Communication: Discussed with patient.  Disposition Plan: Admit for observation.    Shawnetta Lein N. [facility] Pager [phone].  If 7PM-7AM, please contact night-coverage [email or web address] Password [email or web address] 02/18/2015, 3:06 AM

## 2015-02-19 DIAGNOSIS — Z8673 Personal history of transient ischemic attack (TIA), and cerebral infarction without residual deficits: Secondary | ICD-10-CM

## 2015-02-19 DIAGNOSIS — S82201A Unspecified fracture of shaft of right tibia, initial encounter for closed fracture: Secondary | ICD-10-CM

## 2015-02-19 DIAGNOSIS — R55 Syncope and collapse: Principal | ICD-10-CM

## 2015-02-19 DIAGNOSIS — I10 Essential (primary) hypertension: Secondary | ICD-10-CM

## 2015-02-19 DIAGNOSIS — S82291A Other fracture of shaft of right tibia, initial encounter for closed fracture: Secondary | ICD-10-CM

## 2015-02-19 LAB — BASIC METABOLIC PANEL
ANION GAP: 8 (ref 5–15)
BUN: <5 mg/dL — ABNORMAL LOW (ref 6–20)
CO2: 23 mmol/L (ref 22–32)
Calcium: 7.9 mg/dL — ABNORMAL LOW (ref 8.9–10.3)
Chloride: 104 mmol/L (ref 101–111)
Creatinine, Ser: 0.89 mg/dL (ref 0.61–1.24)
GFR calc non Af Amer: >60 mL/min (ref >60)
Glucose, Bld: 95 mg/dL (ref 65–99)
POTASSIUM: 3.7 mmol/L (ref 3.5–5.1)
SODIUM: 135 mmol/L (ref 135–145)

## 2015-02-19 LAB — CBC
HCT: 33.6 % — ABNORMAL LOW (ref 39.0–52.0)
HEMOGLOBIN: 11.1 g/dL — AB (ref 13.0–17.0)
MCH: 32.4 pg (ref 26.0–34.0)
MCHC: 33 g/dL (ref 30.0–36.0)
MCV: 98 fL (ref 78.0–100.0)
Platelets: 107 10*3/uL — ABNORMAL LOW (ref 150–400)
RBC: 3.43 MIL/uL — AB (ref 4.22–5.81)
RDW: 13.1 % (ref 11.5–15.5)
WBC: 5.3 10*3/uL (ref 4.0–10.5)

## 2015-02-19 MED ORDER — MORPHINE SULFATE 15 MG PO TABS
15.0000 mg | ORAL_TABLET | ORAL | Status: DC | PRN
  Administered 2015-02-19: 15 mg via ORAL
  Filled 2015-02-19: qty 1

## 2015-02-19 NOTE — Progress Notes (Signed)
Physical Therapy Treatment Patient Details Name: Tanner [name] MRN: [id number] DOB: 1956-02-25 Today's Date: 02/19/2015    History of Present Illness Fracture of tibia, right, closed    PT Comments    Patient progressing very slowly. Patient is not safe to DC home at this time. He is unable to walk or complete steps. Patient with complaints of increase pain and some dizziness with mobility. Patient would benefit from CIR to increase functional mobility and train with ambulation with R LE being NWB and a challenge. At this time will update to CIR for ongoing therapy and to decreased burden of care for his daughter upon DC.   Follow Up Recommendations  CIR;Supervision for mobility/OOB     Equipment Recommendations       Recommendations for Other Services Rehab consult     Precautions / Restrictions Precautions Precautions: Fall Required Braces or Orthoses: Knee Immobilizer - Right Knee Immobilizer - Right: On at all times Restrictions Weight Bearing Restrictions: Yes RLE Weight Bearing: Non weight bearing    Mobility  Bed Mobility Overal bed mobility: Needs Assistance Bed Mobility: Supine to Sit;Sit to Supine     Supine to sit: Min assist Sit to supine: Min assist   General bed mobility comments: Heavy use of rails and A to manage R LE  Transfers Overall transfer level: Needs assistance Equipment used: Rolling walker (2 wheeled) Transfers: Stand Pivot Transfers Sit to Stand: Mod assist;+2 safety/equipment Stand pivot transfers: +2 physical assistance;Min assist       General transfer comment: Patient stated he was initially dizzy with sitting EOB but subsided and agreeable to transfer to recliner. +2 assist to SPT for safety and to ensure R LE NWB and balance. Patient able to take some pivotal hops to reclienr  Ambulation/Gait             General Gait Details: unable to attempt   Stairs            Wheelchair Mobility    Modified Rankin (Stroke  Patients Only)       Balance Overall balance assessment: Needs assistance Sitting-balance support: No upper extremity supported;Single extremity supported Sitting balance-Leahy Scale: Fair     Standing balance support: Bilateral upper extremity supported;During functional activity Standing balance-Leahy Scale: Poor                      Cognition Arousal/Alertness: Awake/alert Behavior During Therapy: WFL for tasks assessed/performed Overall Cognitive Status: Within Functional Limits for tasks assessed                      Exercises      General Comments        Pertinent Vitals/Pain Pain Assessment: 0-10 Pain Score: 8  Pain Location: R LE under knee cap Pain Descriptors / Indicators: Throbbing;Constant Pain Intervention(s): Monitored during session;Repositioned    Home Living Family/patient expects to be discharged to:: Private residence Living Arrangements: Children;Other relatives Available Help at Discharge: Family;Friend(s) Type of Home: Mobile home Home Access: Stairs to enter Entrance Stairs-Rails: Right Home Layout: One level Home Equipment: Walker - 2 wheels;Cane - quad;Crutches;Bedside commode;Shower seat      Prior Function Level of Independence: Independent (slow gait)          PT Goals (current goals can now be found in the care plan section) Acute Rehab PT Goals Patient Stated Goal: get stronger so I can go home Progress towards PT goals: Progressing toward goals    Frequency  Min 4X/week    PT Plan Discharge plan needs to be updated    Co-evaluation             End of Session Equipment Utilized During Treatment: Gait belt;Right knee immobilizer   Patient left: in chair;with call bell/phone within reach     Time: 1120-1140 PT Time Calculation (min) (ACUTE ONLY): 20 min  Charges:  $Therapeutic Activity: 8-22 mins                    G Codes:      Jacqualyn Posey 02/19/2015, 11:43  AM 02/19/2015 Jacqualyn Posey PTA [phone] pager [phone] office

## 2015-02-19 NOTE — Discharge Summary (Signed)
Physician Discharge Summary  Tanner [name] WNI:[id number] DOB: 1955-10-09 DOA: 02/17/2015  PCP: Criselda Peaches, MD  Admit date: 02/17/2015 Discharge date: 02/19/2015  Time spent: 60 minutes  Recommendations for Outpatient Follow-up:  1. Patient be discharged to inpatient rehabilitation. 2. Patient is to follow-up with Dr. Rhona Raider, orthopedics in 2 weeks.  Discharge Diagnoses:  Principal Problem:   Syncope Active Problems:   Fracture of tibia, right, closed   Hyponatremia   Tibial fracture   Essential hypertension   History of stroke   Discharge Condition: Stable and improved.  Diet recommendation: Heart healthy.  There were no vitals filed for this visit.  History of present illness:  Per Dr. Dierdre Searles is a 59 y.o. male with history of alcoholism, hypertension, prostate cancer status post radical prostatectomy, history of CVA with right-sided weakness had a fall at his house when he was trying to open the door. Patient had sustained injury to his right lower extremity. X-rays reveal fractures of the right tibia and fibula and on call orthopedic surgeon Dr.Daldorf was consulted and orthopedic surgeon at this time is advised nonsurgical management with knee immobilizer and follow-up with him. While in the ER patient was having a CT scan done when patient suddenly lost consciousness briefly with diaphoresis as per the ER physician. Patient otherwise denies any palpitations chest pain. EKG shows normal sinus rhythm. Patient has been admitted for observation.   Hospital Course:  #1 syncope--likely vasovagal Patient was admitted noted to have some sudden loss of consciousness briefly with diaphoresis while in the CT scan. Patient denied any chest pain, no shortness of breath. EKG done showed a normal sinus rhythm. CT head which was done was negative. Patient did not have any focal neurological deficits. Patient did have a normal 2-D echo and carotid Dopplers which were done  on 09/03/2013 had no significant ICA stenosis. No arrhythmias were noted on telemetry. Patient did not have any further episodes. Patient was also noted on admission to have borderline blood pressure with systolics in the 38H. Patient was hydrated with IV fluids and was euvolemic by day of discharge. Patient be discharged in stable and improved condition is to follow-up with PCP as outpatient.  #2 right proximal tibia fracture Patient was admitted with a right proximal tibia fracture after a mechanical fall. CT of the head which was done was negative. ED physician initially spoke with orthopedic surgeon who reviewed the films and felt this was a nonsurgical management and patient was recommended to be placed in a knee mobilizer. However during the hospitalization patient was complaining of significant pain and inability to left is right leg up and a such a formal orthopedic consultation was obtained. Patient was seen in consultation by Dr. Rhona Raider who felt nonsurgical management at this time. It was felt patient should heal well closed with the use of a knee brace and needed to remain nonweightbearing for up to 2 months. Was recommended patient follow-up in the outpatient setting in 2 weeks for repeat x-rays and further evaluation. Patient was seen by physical therapy. Inpatient rehabilitation was recommended and patient will be discharged to inpatient rehabilitation.  #3 hypertension Patient was maintained on his home regimen of antihypertensives medications.  #4 history of stroke Patient was maintained on home regimen of anti-platelet agents.  #5 history of alcohol abuse Remained stable. Patient with no signs of withdrawal. Patient was maintained on Ativan withdrawal protocol however it did not exhibit any signs of withdrawal or delirium tremens. Patient remained stable throughout  the hospitalization..  The rest of patient's chronic medical issues remained stable throughout the hospitalization and  patient be discharged in stable and improved condition.  Procedures:  CT head without contrast 02/18/2015  Chest x-ray 02/17/2015  X-ray of the right tib-fib 02/17/2015  X-ray of the knee 02/17/2015  Consultations:  Orthopedics: Dr. Rhona Raider 02/18/2015  Discharge Exam: Filed Vitals:   02/19/15 1400  BP: 147/91  Pulse: 81  Temp: 99.3 F (37.4 C)  Resp: 18    General: NAD Cardiovascular: RRR Respiratory: CTAB  Discharge Instructions    Current Discharge Medication List    CONTINUE these medications which have NOT CHANGED   Details  aspirin 81 MG EC tablet Take 4 tablets (325 mg total) by mouth daily. Qty: 30 tablet, Refills: 0    Aspirin-Acetaminophen-Caffeine (GOODYS EXTRA STRENGTH) 260-130-16 MG TABS Take 1 Package by mouth daily as needed (for pain).    clonazePAM (KLONOPIN) 0.5 MG tablet Take 1 tablet (0.5 mg total) by mouth 3 (three) times daily as needed for anxiety. Qty: 90 tablet, Refills: 0    FLUoxetine (PROZAC) 20 MG capsule Take 1 capsule (20 mg total) by mouth daily. Qty: 30 capsule, Refills: 3    !! lisinopril (PRINIVIL,ZESTRIL) 40 MG tablet Take 40 mg by mouth daily.    !! metoprolol (LOPRESSOR) 50 MG tablet Take 50 mg by mouth 2 (two) times daily.    omeprazole (PRILOSEC) 40 MG capsule Take 40 mg by mouth 2 (two) times daily.     simvastatin (ZOCOR) 20 MG tablet Take 1 tablet (20 mg total) by mouth daily at 6 PM. Qty: 30 tablet, Refills: 1    ciprofloxacin (CIPRO) 500 MG tablet Take 1 tablet (500 mg total) by mouth 2 (two) times daily. Qty: 12 tablet, Refills: 0    HYDROcodone-acetaminophen (NORCO/VICODIN) 5-325 MG per tablet Take 1 tablet by mouth every 6 (six) hours as needed for moderate pain. Qty: 90 tablet, Refills: 0    hyoscyamine (LEVSIN, ANASPAZ) 0.125 MG tablet Take 1 tablet (0.125 mg total) by mouth every 4 (four) hours as needed (bladder spasms). Qty: 40 tablet, Refills: 4    !! lisinopril (PRINIVIL,ZESTRIL) 10 MG tablet  Take 1 tablet (10 mg total) by mouth every morning. Qty: 30 tablet, Refills: 1    !! metoprolol (LOPRESSOR) 25 MG tablet Take 1 tablet (25 mg total) by mouth 2 (two) times daily. Qty: 60 tablet, Refills: 1    morphine (MSIR) 15 MG tablet Take 1 tablet (15 mg total) by mouth every 4 (four) hours as needed for moderate pain or severe pain. Qty: 60 tablet, Refills: 0    traZODone (DESYREL) 50 MG tablet Take 1 tablet (50 mg total) by mouth at bedtime as needed for sleep. Qty: 30 tablet, Refills: 1    vancomycin (VANCOCIN) 50 mg/mL oral solution Take 5 mLs (250 mg total) by mouth every 6 (six) hours. Stop date March 27th, 2015 Qty: 100 mL, Refills: 0     !! - Potential duplicate medications found. Please discuss with provider.     Allergies  Allergen Reactions  . Dilaudid [Hydromorphone Hcl] Nausea And Vomiting    Pre pt history  . Oxycodone Nausea And Vomiting  . Procaine Hcl Other (See Comments)    Patient states he is allergic to novicaine  . Zofran [Ondansetron] Nausea And Vomiting  . Sulfa Antibiotics Rash   Follow-up Information    Follow up with Hessie Dibble, MD. Schedule an appointment as soon as possible for a visit in  2 weeks.   Specialty:  Orthopedic Surgery   Contact information:   [address] [place] [id number] [phone]        The results of significant diagnostics from this hospitalization (including imaging, microbiology, ancillary and laboratory) are listed below for reference.    Significant Diagnostic Studies: Dg Tibia/fibula Right  02/17/2015   CLINICAL DATA:  Further evaluation of known tibial fracture. Initial encounter.  EXAM: RIGHT TIBIA AND FIBULA - 2 VIEW  COMPARISON:  Right tibia/fibula radiographs performed 12/09/2012  FINDINGS: As noted on recent prior studies, there is a comminuted oblique slightly displaced fracture extending across the proximal tibial metadiaphysis. An associated moderate knee joint effusion is noted. There is also a  minimally displaced fracture involving the proximal fibula.  Distal fibular hardware appears grossly intact, without evidence of loosening. Distal tibial screws are also grossly unremarkable. Lateral soft tissue swelling is noted at the ankle. The ankle mortise is grossly unremarkable.  IMPRESSION: 1. Comminuted oblique slightly displaced fracture extending across the proximal tibial metadiaphysis, with associated moderate knee joint effusion. 2. Minimally displaced fracture involving the proximal fibula.   Electronically Signed   By: Garald Balding M.D.   On: 02/17/2015 23:24   Ct Head Wo Contrast  02/18/2015   CLINICAL DATA:  Syncopal episode earlier today. Similar episode 2 weeks prior  EXAM: CT HEAD WITHOUT CONTRAST  TECHNIQUE: Contiguous axial images were obtained from the base of the skull through the vertex without intravenous contrast.  COMPARISON:  Head CT September 06, 2013.  Brain MRI September 02, 2013  FINDINGS: Mild generalized atrophy is stable. There is no demonstrable mass, hemorrhage, extra-axial fluid collection, or midline shift. There is decreased attenuation in the inferior left centrum semiovale with extension inferiorly to the posterior aspect of the posterior limb of the left internal capsule consistent with prior infarct. Elsewhere, there is minimal periventricular small vessel disease. No acute infarct appreciable.  : Bony calvarium appears intact.  The mastoid air cells are clear.  IMPRESSION: Atrophy with prior infarct involving the inferior left centrum semiovale extending inferiorly into the posterior aspect of the posterior limb of the left internal capsule. There is minimal periventricular small vessel disease. No acute appearing infarct. No mass or hemorrhage.   Electronically Signed   By: Lowella Grip III M.D.   On: 02/18/2015 01:02   Dg Chest Port 1 View  02/18/2015   CLINICAL DATA:  Acute onset of exertional dyspnea. Diaphoresis. Near syncope. Initial encounter.  EXAM:  PORTABLE CHEST - 1 VIEW  COMPARISON:  Chest radiograph performed 09/10/2013  FINDINGS: The lungs are well-aerated. Minimal left basilar opacity likely reflects atelectasis. There is no evidence of pleural effusion or pneumothorax.  The cardiomediastinal silhouette is within normal limits. No acute osseous abnormalities are seen.  IMPRESSION: Minimal left basilar opacity likely reflects atelectasis. Lungs otherwise clear.   Electronically Signed   By: Garald Balding M.D.   On: 02/18/2015 00:23   Dg Knee Complete 4 Views Right  02/17/2015   CLINICAL DATA:  Pain following fall earlier today  EXAM: RIGHT KNEE - COMPLETE 4+ VIEW  COMPARISON:  June 30, 2013  FINDINGS: Frontal, lateral, and bilateral oblique views were obtained. There is a comminuted fracture of the proximal tibia with fractures extending from the lateral aspect of the medial tibial plateau into the proximal tibial diaphysis. There also fractures in the proximal tibial metaphysis and medial tibial diaphysis. Alignment in these areas of fracture are near anatomic. No dislocation. There is a  fat - fluid level in the suprapatellar bursa consistent with the fractures. No appreciable joint space narrowing.  IMPRESSION: Comminuted fractures of the proximal tibia with major fracture fragments in overall near anatomic alignment. Fat-fluid level in the suprapatellar bursa consistent with the fractures. No dislocation. No appreciable joint space narrowing.   Electronically Signed   By: Lowella Grip III M.D.   On: 02/17/2015 21:20    Microbiology: No results found for this or any previous visit (from the past 240 hour(s)).   Labs: Basic Metabolic Panel:  Recent Labs Lab 02/17/15 2246 02/18/15 0415 02/19/15 0547  NA 127* 134* 135  K 4.7 3.9 3.7  CL 96* 104 104  CO2 19* 21* 23  GLUCOSE 91 103* 95  BUN 13 10 <5*  CREATININE 1.32* 1.17 0.89  CALCIUM 8.4* 7.7* 7.9*   Liver Function Tests:  Recent Labs Lab 02/17/15 2246 02/18/15 0415   AST 35 24  ALT 21 16*  ALKPHOS 62 48  BILITOT 1.0 0.7  PROT 6.6 5.6*  ALBUMIN 3.4* 2.7*   No results for input(s): LIPASE, AMYLASE in the last 168 hours. No results for input(s): AMMONIA in the last 168 hours. CBC:  Recent Labs Lab 02/17/15 2246 02/18/15 0415 02/19/15 0547  WBC 6.7 7.8 5.3  NEUTROABS 4.8 6.3  --   HGB 13.3 11.3* 11.1*  HCT 39.3 33.4* 33.6*  MCV 96.3 97.7 98.0  PLT 166 122* 107*   Cardiac Enzymes:  Recent Labs Lab 02/18/15 0415 02/18/15 1003 02/18/15 1615  TROPONINI <0.03 <0.03 <0.03   BNP: BNP (last 3 results) No results for input(s): BNP in the last 8760 hours.  ProBNP (last 3 results) No results for input(s): PROBNP in the last 8760 hours.  CBG: No results for input(s): GLUCAP in the last 168 hours.     SignedIrine Seal MD [facility] 02/19/2015, 4:27 PM

## 2015-02-19 NOTE — Consult Note (Signed)
Physical Medicine and Rehabilitation Consult Reason for Consult: Right proximal tibia fracture/multiple medical Referring Physician: [facility]   HPI: Tanner [name] is a 59 y.o. right handed male with history of alcoholism, hypertension, prostate cancer with radical prostatectomy with postop CVA and right sided weakness maintained on aspirin. Patient did receive inpatient rehabilitation services after CVA February 2015. He lives with his family and assistance as needed independent without assistive device prior to admission. Presented 02/18/2015 after a fall trying to open a door with right lower extremity pain. Noted in the ED patient with brief loss of consciousness becoming diaphoretic. EKG normal sinus rhythm. CT of the head showed no acute appearing infarct. X-rays and imaging of right lower extremity showed right proximal tibia fracture. Orthopedic services Dr. Rhona Raider consulted advised no surgical intervention. Placed in a knee brace nonweightbearing for 2 months. Hospital course pain management. Subcutaneous Lovenox for DVT prophylaxis. Physical therapy evaluation completed 02/19/2015 with recommendations of physical medicine rehabilitation consult.  Patient has been living with his daughter but has not required physical assistance from her. She goes to college but lives in the same home. She'll be starting college next week but will be able to assist some days more than others  Review of Systems  Constitutional: Negative for fever and chills.  Eyes: Negative for blurred vision.  Respiratory: Positive for cough.        Shortness of breath with exertion.  Cardiovascular: Negative for chest pain and palpitations.  Gastrointestinal: Positive for nausea and constipation.       GERD  Genitourinary: Positive for urgency.  Musculoskeletal: Positive for myalgias and falls.  Skin: Negative for rash.  Neurological: Positive for dizziness and weakness. Negative for headaches.    Psychiatric/Behavioral:       Anxiety   Past Medical History  Diagnosis Date  . Alcoholic hepatitis   . Hypertension   . 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 Shortness of breath     new onset- occasionally-at rest and exertion  . GERD (gastroesophageal reflux disease)   . Anxiety   . TIA (transient ischemic attack) 08/2013  . Arthritis     " in my spine "  . Ileus 09/2013   Past Surgical History  Procedure Laterality Date  . Hand surgery Right   . Hernia repair    . Orif ankle fracture Right 12/10/2012    Procedure: OPEN REDUCTION INTERNAL FIXATION (ORIF) ANKLE FRACTURE;  Surgeon: Hessie Dibble, MD;  Location: [facility];  Service: Orthopedics;  Laterality: Right;  . Robot assisted laparoscopic radical prostatectomy N/A 08/29/2013    Procedure: ROBOTIC ASSISTED LAPAROSCOPIC RADICAL PROSTATECTOMY LEVEL 2, Lysis of adhesions;  Surgeon: Molli Hazard, MD;  Location: [facility];  Service: Urology;  Laterality: N/A;  . Lymphadenectomy Bilateral 08/29/2013    Procedure: LYMPHADENECTOMY "BILATERAL PELVIC LYMPH NODE DISSECTION";  Surgeon: Molli Hazard, MD;  Location: [facility];  Service: Urology;  Laterality: Bilateral;   Family History  Problem Relation Age of Onset  . Pancreatic cancer Sister   . Lung cancer Father    Social History:  reports that he quit smoking about 2 years ago. He has never used smokeless tobacco. He reports that he drinks alcohol. He reports that he uses illicit drugs. Allergies:  Allergies  Allergen Reactions  . Dilaudid [Hydromorphone Hcl] Nausea And Vomiting    Pre pt history  . Oxycodone Nausea And Vomiting  . Procaine Hcl Other (See Comments)    Patient states  he is allergic to novicaine  . Zofran [Ondansetron] Nausea And Vomiting  . Sulfa Antibiotics Rash   Medications Prior to Admission  Medication Sig Dispense Refill  . aspirin 81 MG EC tablet Take 4 tablets (325 mg total) by mouth daily. 30 tablet 0  .  Aspirin-Acetaminophen-Caffeine (GOODYS EXTRA STRENGTH) 260-130-16 MG TABS Take 1 Package by mouth daily as needed (for pain).    . clonazePAM (KLONOPIN) 0.5 MG tablet Take 1 tablet (0.5 mg total) by mouth 3 (three) times daily as needed for anxiety. 90 tablet 0  . FLUoxetine (PROZAC) 20 MG capsule Take 1 capsule (20 mg total) by mouth daily. 30 capsule 3  . lisinopril (PRINIVIL,ZESTRIL) 40 MG tablet Take 40 mg by mouth daily.    . metoprolol (LOPRESSOR) 50 MG tablet Take 50 mg by mouth 2 (two) times daily.    Marland Kitchen omeprazole (PRILOSEC) 40 MG capsule Take 40 mg by mouth 2 (two) times daily.     . simvastatin (ZOCOR) 20 MG tablet Take 1 tablet (20 mg total) by mouth daily at 6 PM. 30 tablet 1  . ciprofloxacin (CIPRO) 500 MG tablet Take 1 tablet (500 mg total) by mouth 2 (two) times daily. (Patient not taking: Reported on 02/18/2015) 12 tablet 0  . HYDROcodone-acetaminophen (NORCO/VICODIN) 5-325 MG per tablet Take 1 tablet by mouth every 6 (six) hours as needed for moderate pain. (Patient not taking: Reported on 02/18/2015) 90 tablet 0  . hyoscyamine (LEVSIN, ANASPAZ) 0.125 MG tablet Take 1 tablet (0.125 mg total) by mouth every 4 (four) hours as needed (bladder spasms). (Patient not taking: Reported on 02/18/2015) 40 tablet 4  . lisinopril (PRINIVIL,ZESTRIL) 10 MG tablet Take 1 tablet (10 mg total) by mouth every morning. (Patient not taking: Reported on 02/18/2015) 30 tablet 1  . metoprolol (LOPRESSOR) 25 MG tablet Take 1 tablet (25 mg total) by mouth 2 (two) times daily. (Patient not taking: Reported on 02/18/2015) 60 tablet 1  . morphine (MSIR) 15 MG tablet Take 1 tablet (15 mg total) by mouth every 4 (four) hours as needed for moderate pain or severe pain. (Patient not taking: Reported on 02/18/2015) 60 tablet 0  . traZODone (DESYREL) 50 MG tablet Take 1 tablet (50 mg total) by mouth at bedtime as needed for sleep. 30 tablet 1  . vancomycin (VANCOCIN) 50 mg/mL oral solution Take 5 mLs (250 mg total) by mouth  every 6 (six) hours. Stop date March 27th, 2015 (Patient not taking: Reported on 02/18/2015) 100 mL 0    Home: Home Living Family/patient expects to be discharged to:: Private residence Living Arrangements: Children, Other relatives Available Help at Discharge: Family, Friend(s) Type of Home: Mobile home Home Access: Stairs to enter Technical brewer of Steps: 4-5 Entrance Stairs-Rails: Right Home Layout: One level Bathroom Shower/Tub: Gaffer, Charity fundraiser: Standard Home Equipment: Environmental consultant - 2 wheels, [employer] - quad, Crutches, Bedside commode, Shower seat  Functional History: Prior Function Level of Independence: Independent (slow gait) Comments: cane Functional Status:  Mobility: Bed Mobility Overal bed mobility: Needs Assistance Bed Mobility: Supine to Sit, Sit to Supine Supine to sit: Min assist (heavy min assist, therapist providing 100% assist > RLE) Sit to supine: Min assist General bed mobility comments: Pt unable to manage RLE. Pt limited by dizziness, pain, and fatigue Transfers Overall transfer level: Needs assistance Equipment used: Rolling walker (2 wheeled) Transfers: Sit to/from Stand Sit to Stand: Mod assist General transfer comment: Mod assist to lift and lower from EOB. Pt continues to  complain of dizziness upon transitional movements. Pt required mod verbal cueing for hand placement and technique to adhere to NWB>RLE.       ADL: ADL Overall ADL's : Needs assistance/impaired Eating/Feeding: Set up, Bed level Grooming: Set up, Bed level Upper Body Bathing: Minimal assitance, Sitting Lower Body Bathing: Total assistance, Sit to/from stand, Cueing for safety Upper Body Dressing : Minimal assistance, Sitting Lower Body Dressing: Sit to/from stand, Total assistance, Cueing for safety Toilet Transfer Details (indicate cue type and reason): Did not occur secondary to increased fatigue and dizziness in sitting and standing positions General ADL  Comments: Pt limited secondary to pain, dizziness upon sitting/standing, and increased fatigue. Pt required heavy min assist to sit EOB, therapist providing 100% assistance with RLE (pt unable). Pt stood EOB with mod assist using RW, cues for hand placement and technique to adhere to NWB>RLE. Pt unsafe to d/c home alone. Will benefit from CIR.   Cognition: Cognition Overall Cognitive Status: Within Functional Limits for tasks assessed Cognition Arousal/Alertness: Awake/alert Behavior During Therapy: WFL for tasks assessed/performed Overall Cognitive Status: Within Functional Limits for tasks assessed  Blood pressure 163/90, pulse 80, temperature 98.9 F (37.2 C), temperature source Oral, resp. rate 20, SpO2 99 %. Physical Exam  Constitutional: He is oriented to person, place, and time.  HENT:  Head: Normocephalic.  Eyes: EOM are normal.  Neck: Normal range of motion. Neck supple. No thyromegaly present.  Cardiovascular: Normal rate and regular rhythm.   Respiratory: Effort normal and breath sounds normal. No respiratory distress.  GI: Soft. Bowel sounds are normal. He exhibits no distension.  Neurological: He is alert and oriented to person, place, and time.  Skin: Skin is warm and dry.  Motor strength is 4+/5 bilateral deltoid, biceps, triceps, grip 2 minus in the right hip flexor inhibited by pain 2 minus at the ankle dorsiflexor and plantar flexor and hit by pain he has a knee immobilizer on Left lower extremity is 4/5 strength in hip flexor and extensor ankle displaced plantar flexor Sensation intact to light touch bilateral hands and feet  Results for orders placed or performed during the hospital encounter of 02/17/15 (from the past 24 hour(s))  Troponin I (q 6hr x 3)     Status: None   Collection Time: 02/18/15  4:15 PM  Result Value Ref Range   Troponin I <0.03 <0.031 ng/mL  CBC     Status: Abnormal   Collection Time: 02/19/15  5:47 AM  Result Value Ref Range   WBC 5.3 4.0  - 10.5 K/uL   RBC 3.43 (L) 4.22 - 5.81 MIL/uL   Hemoglobin 11.1 (L) 13.0 - 17.0 g/dL   HCT 33.6 (L) 39.0 - 52.0 %   MCV 98.0 78.0 - 100.0 fL   MCH 32.4 26.0 - 34.0 pg   MCHC 33.0 30.0 - 36.0 g/dL   RDW 13.1 11.5 - 15.5 %   Platelets 107 (L) 150 - 400 K/uL  Basic metabolic panel     Status: Abnormal   Collection Time: 02/19/15  5:47 AM  Result Value Ref Range   Sodium 135 135 - 145 mmol/L   Potassium 3.7 3.5 - 5.1 mmol/L   Chloride 104 101 - 111 mmol/L   CO2 23 22 - 32 mmol/L   Glucose, Bld 95 65 - 99 mg/dL   BUN <5 (L) 6 - 20 mg/dL   Creatinine, Ser 0.89 0.61 - 1.24 mg/dL   Calcium 7.9 (L) 8.9 - 10.3 mg/dL   GFR calc  non Af Amer >60 >60 mL/min   GFR calc Af Amer >60 >60 mL/min   Anion gap 8 5 - 15   Dg Tibia/fibula Right  02/17/2015   CLINICAL DATA:  Further evaluation of known tibial fracture. Initial encounter.  EXAM: RIGHT TIBIA AND FIBULA - 2 VIEW  COMPARISON:  Right tibia/fibula radiographs performed 12/09/2012  FINDINGS: As noted on recent prior studies, there is a comminuted oblique slightly displaced fracture extending across the proximal tibial metadiaphysis. An associated moderate knee joint effusion is noted. There is also a minimally displaced fracture involving the proximal fibula.  Distal fibular hardware appears grossly intact, without evidence of loosening. Distal tibial screws are also grossly unremarkable. Lateral soft tissue swelling is noted at the ankle. The ankle mortise is grossly unremarkable.  IMPRESSION: 1. Comminuted oblique slightly displaced fracture extending across the proximal tibial metadiaphysis, with associated moderate knee joint effusion. 2. Minimally displaced fracture involving the proximal fibula.   Electronically Signed   By: Garald Balding M.D.   On: 02/17/2015 23:24   Ct Head Wo Contrast  02/18/2015   CLINICAL DATA:  Syncopal episode earlier today. Similar episode 2 weeks prior  EXAM: CT HEAD WITHOUT CONTRAST  TECHNIQUE: Contiguous axial images  were obtained from the base of the skull through the vertex without intravenous contrast.  COMPARISON:  Head CT September 06, 2013.  Brain MRI September 02, 2013  FINDINGS: Mild generalized atrophy is stable. There is no demonstrable mass, hemorrhage, extra-axial fluid collection, or midline shift. There is decreased attenuation in the inferior left centrum semiovale with extension inferiorly to the posterior aspect of the posterior limb of the left internal capsule consistent with prior infarct. Elsewhere, there is minimal periventricular small vessel disease. No acute infarct appreciable.  : Bony calvarium appears intact.  The mastoid air cells are clear.  IMPRESSION: Atrophy with prior infarct involving the inferior left centrum semiovale extending inferiorly into the posterior aspect of the posterior limb of the left internal capsule. There is minimal periventricular small vessel disease. No acute appearing infarct. No mass or hemorrhage.   Electronically Signed   By: Lowella Grip III M.D.   On: 02/18/2015 01:02   Dg Chest Port 1 View  02/18/2015   CLINICAL DATA:  Acute onset of exertional dyspnea. Diaphoresis. Near syncope. Initial encounter.  EXAM: PORTABLE CHEST - 1 VIEW  COMPARISON:  Chest radiograph performed 09/10/2013  FINDINGS: The lungs are well-aerated. Minimal left basilar opacity likely reflects atelectasis. There is no evidence of pleural effusion or pneumothorax.  The cardiomediastinal silhouette is within normal limits. No acute osseous abnormalities are seen.  IMPRESSION: Minimal left basilar opacity likely reflects atelectasis. Lungs otherwise clear.   Electronically Signed   By: Garald Balding M.D.   On: 02/18/2015 00:23   Dg Knee Complete 4 Views Right  02/17/2015   CLINICAL DATA:  Pain following fall earlier today  EXAM: RIGHT KNEE - COMPLETE 4+ VIEW  COMPARISON:  June 30, 2013  FINDINGS: Frontal, lateral, and bilateral oblique views were obtained. There is a comminuted fracture  of the proximal tibia with fractures extending from the lateral aspect of the medial tibial plateau into the proximal tibial diaphysis. There also fractures in the proximal tibial metaphysis and medial tibial diaphysis. Alignment in these areas of fracture are near anatomic. No dislocation. There is a fat - fluid level in the suprapatellar bursa consistent with the fractures. No appreciable joint space narrowing.  IMPRESSION: Comminuted fractures of the proximal tibia with major fracture  fragments in overall near anatomic alignment. Fat-fluid level in the suprapatellar bursa consistent with the fractures. No dislocation. No appreciable joint space narrowing.   Electronically Signed   By: Lowella Grip III M.D.   On: 02/17/2015 21:20    Assessment/Plan: Diagnosis: Comminuted right proximal tibial fracture, nonoperative treatment with nonweightbearing in a patient with prior CVA 1. Does the need for close, 24 hr/day medical supervision in concert with the patient's rehab needs make it unreasonable for this patient to be served in a less intensive setting? Yes 2. Co-Morbidities requiring supervision/potential complications: History of prostate cancer, hypertension, history of alcoholic hepatitis 3. Due to bladder management, bowel management, safety, skin/wound care, disease management, medication administration, pain management and patient education, does the patient require 24 hr/day rehab nursing? Yes 4. Does the patient require coordinated care of a physician, rehab nurse, PT (11-2 hrs/day, 5 days/week) and OT (1-2 hrs/day, 5 days/week) to address physical and functional deficits in the context of the above medical diagnosis(es)? Yes Addressing deficits in the following areas: balance, endurance, locomotion, strength, transferring, bowel/bladder control, bathing, dressing, feeding, grooming and toileting 5. Can the patient actively participate in an intensive therapy program of at least 3 hrs of  therapy per day at least 5 days per week? Yes 6. The potential for patient to make measurable gains while on inpatient rehab is excellent 7. Anticipated functional outcomes upon discharge from inpatient rehab are modified independent and supervision  with PT, modified independent and supervision with OT, n/a with SLP. 8. Estimated rehab length of stay to reach the above functional goals is: 5-7 days 9. Does the patient have adequate social supports and living environment to accommodate these discharge functional goals? Yes 10. Anticipated D/C setting: Home 11. Anticipated post D/C treatments: [place] therapy 12. Overall Rehab/Functional Prognosis: excellent  RECOMMENDATIONS: This patient's condition is appropriate for continued rehabilitative care in the following setting: CIR Patient has agreed to participate in recommended program. Yes Note that insurance prior authorization may be required for reimbursement for recommended care.  Comment:     02/19/2015

## 2015-02-19 NOTE — Progress Notes (Signed)
Patient transfered to 4 mid [place] at this time.

## 2015-02-19 NOTE — H&P (Signed)
Physical Medicine and Rehabilitation Admission H&P   Chief Complaint  Patient presents with  . Fall  : HPI: Tanner [name] is a 59 y.o. right handed male with history of alcoholism, hypertension, prostate cancer with radical prostatectomy with postop CVA and right sided weakness maintained on aspirin. Patient did receive inpatient rehabilitation services after CVA February 2015. He lives with his family and assistance as needed independent without assistive device prior to admission. Presented 02/18/2015 after a fall trying to open a door with right lower extremity pain. Noted in the ED patient with brief loss of consciousness becoming diaphoretic. EKG normal sinus rhythm. CT of the head showed no acute appearing infarct. X-rays and imaging of right lower extremity showed right proximal tibia fracture. Orthopedic services Dr. Rhona Raider consulted advised no surgical intervention. Placed in a knee brace nonweightbearing for 2 months. Hospital course pain management. Subcutaneous Lovenox for DVT prophylaxis. Physical therapy evaluation completed 02/19/2015 with recommendations of physical medicine rehabilitation consult. Patient was admitted for comprehensive rehabilitation program  Pt still complaining of severe pain but is able to move the RLE slightly better  ROS Review of Systems  Constitutional: Negative for fever and chills.  Eyes: Negative for blurred vision.  Respiratory: Positive for cough.   Shortness of breath with exertion.  Cardiovascular: Negative for chest pain and palpitations.  Gastrointestinal: Positive for nausea and constipation.   GERD  Genitourinary: Positive for urgency.  Musculoskeletal: Positive for myalgias and falls.  Skin: Negative for rash.  Neurological: Positive for dizziness and weakness. Negative for headaches.  Psychiatric/Behavioral:   Anxiety    Past Medical History  Diagnosis Date  . Alcoholic hepatitis   .  Hypertension   . 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 Shortness of breath     new onset- occasionally-at rest and exertion  . GERD (gastroesophageal reflux disease)   . Anxiety   . TIA (transient ischemic attack) 08/2013  . Arthritis     " in my spine "  . Ileus 09/2013   Past Surgical History  Procedure Laterality Date  . Hand surgery Right   . Hernia repair    . Orif ankle fracture Right 12/10/2012    Procedure: OPEN REDUCTION INTERNAL FIXATION (ORIF) ANKLE FRACTURE; Surgeon: Hessie Dibble, MD; Location: [facility]; Service: Orthopedics; Laterality: Right;  . Robot assisted laparoscopic radical prostatectomy N/A 08/29/2013    Procedure: ROBOTIC ASSISTED LAPAROSCOPIC RADICAL PROSTATECTOMY LEVEL 2, Lysis of adhesions; Surgeon: Molli Hazard, MD; Location: [facility]; Service: Urology; Laterality: N/A;  . Lymphadenectomy Bilateral 08/29/2013    Procedure: LYMPHADENECTOMY "BILATERAL PELVIC LYMPH NODE DISSECTION"; Surgeon: Molli Hazard, MD; Location: [facility]; Service: Urology; Laterality: Bilateral;   Family History  Problem Relation Age of Onset  . Pancreatic cancer Sister   . Lung cancer Father    Social History:  reports that he quit smoking about 2 years ago. He has never used smokeless tobacco. He reports that he drinks alcohol. He reports that he uses illicit drugs. Allergies:  Allergies  Allergen Reactions  . Dilaudid [Hydromorphone Hcl] Nausea And Vomiting    Pre pt history  . Oxycodone Nausea And Vomiting  . Procaine Hcl Other (See Comments)    Patient states he is allergic to novicaine  . Zofran [Ondansetron] Nausea And Vomiting  . Sulfa Antibiotics Rash   Medications Prior to Admission  Medication Sig Dispense Refill  . aspirin 81 MG EC tablet Take 4 tablets (325 mg total) by mouth daily. 30 tablet 0  .  Aspirin-Acetaminophen-Caffeine (GOODYS EXTRA STRENGTH) 260-130-16 MG TABS Take 1 Package by mouth daily as needed (for pain).    . clonazePAM (KLONOPIN) 0.5 MG tablet Take 1 tablet (0.5 mg total) by mouth 3 (three) times daily as needed for anxiety. 90 tablet 0  . FLUoxetine (PROZAC) 20 MG capsule Take 1 capsule (20 mg total) by mouth daily. 30 capsule 3  . lisinopril (PRINIVIL,ZESTRIL) 40 MG tablet Take 40 mg by mouth daily.    . metoprolol (LOPRESSOR) 50 MG tablet Take 50 mg by mouth 2 (two) times daily.    Marland Kitchen omeprazole (PRILOSEC) 40 MG capsule Take 40 mg by mouth 2 (two) times daily.     . simvastatin (ZOCOR) 20 MG tablet Take 1 tablet (20 mg total) by mouth daily at 6 PM. 30 tablet 1  . ciprofloxacin (CIPRO) 500 MG tablet Take 1 tablet (500 mg total) by mouth 2 (two) times daily. (Patient not taking: Reported on 02/18/2015) 12 tablet 0  . HYDROcodone-acetaminophen (NORCO/VICODIN) 5-325 MG per tablet Take 1 tablet by mouth every 6 (six) hours as needed for moderate pain. (Patient not taking: Reported on 02/18/2015) 90 tablet 0  . hyoscyamine (LEVSIN, ANASPAZ) 0.125 MG tablet Take 1 tablet (0.125 mg total) by mouth every 4 (four) hours as needed (bladder spasms). (Patient not taking: Reported on 02/18/2015) 40 tablet 4  . lisinopril (PRINIVIL,ZESTRIL) 10 MG tablet Take 1 tablet (10 mg total) by mouth every morning. (Patient not taking: Reported on 02/18/2015) 30 tablet 1  . metoprolol (LOPRESSOR) 25 MG tablet Take 1 tablet (25 mg total) by mouth 2 (two) times daily. (Patient not taking: Reported on 02/18/2015) 60 tablet 1  . morphine (MSIR) 15 MG tablet Take 1 tablet (15 mg total) by mouth every 4 (four) hours as needed for moderate pain or severe pain. (Patient not taking: Reported on 02/18/2015) 60 tablet 0  . traZODone (DESYREL) 50 MG tablet Take 1 tablet (50 mg total) by mouth at bedtime as needed for sleep. 30 tablet 1  . vancomycin  (VANCOCIN) 50 mg/mL oral solution Take 5 mLs (250 mg total) by mouth every 6 (six) hours. Stop date March 27th, 2015 (Patient not taking: Reported on 02/18/2015) 100 mL 0    Home: Home Living Family/patient expects to be discharged to:: Private residence Living Arrangements: Children, Other relatives Available Help at Discharge: Family, Friend(s) Type of Home: Mobile home Home Access: Stairs to enter Technical brewer of Steps: 4-5 Entrance Stairs-Rails: Right Home Layout: One level Bathroom Shower/Tub: Gaffer, Charity fundraiser: Standard Home Equipment: Environmental consultant - 2 wheels, [employer] - quad, Crutches, Bedside commode, Shower seat  Functional History: Prior Function Level of Independence: Independent (slow gait) Comments: cane  Functional Status:  Mobility: Bed Mobility Overal bed mobility: Needs Assistance Bed Mobility: Supine to Sit, Sit to Supine Supine to sit: Min assist Sit to supine: Min assist General bed mobility comments: Heavy use of rails and A to manage R LE Transfers Overall transfer level: Needs assistance Equipment used: Rolling walker (2 wheeled) Transfers: Stand Pivot Transfers Sit to Stand: Mod assist, +2 safety/equipment Stand pivot transfers: +2 physical assistance, Min assist General transfer comment: Patient stated he was initially dizzy with sitting EOB but subsided and agreeable to transfer to recliner. +2 assist to SPT for safety and to ensure R LE NWB and balance. Patient able to take some pivotal hops to reclienr Ambulation/Gait General Gait Details: unable to attempt    ADL: ADL Overall ADL's : Needs assistance/impaired Eating/Feeding: Set up, Bed  level Grooming: Set up, Bed level Upper Body Bathing: Minimal assitance, Sitting Lower Body Bathing: Total assistance, Sit to/from stand, Cueing for safety Upper Body Dressing : Minimal assistance, Sitting Lower Body Dressing: Sit to/from stand, Total assistance, Cueing for  safety Toilet Transfer Details (indicate cue type and reason): Did not occur secondary to increased fatigue and dizziness in sitting and standing positions General ADL Comments: Pt limited secondary to pain, dizziness upon sitting/standing, and increased fatigue. Pt required heavy min assist to sit EOB, therapist providing 100% assistance with RLE (pt unable). Pt stood EOB with mod assist using RW, cues for hand placement and technique to adhere to NWB>RLE. Pt unsafe to d/c home alone. Will benefit from CIR.   Cognition: Cognition Overall Cognitive Status: Within Functional Limits for tasks assessed Cognition Arousal/Alertness: Awake/alert Behavior During Therapy: WFL for tasks assessed/performed Overall Cognitive Status: Within Functional Limits for tasks assessed  Physical Exam: Blood pressure 163/90, pulse 80, temperature 98.9 F (37.2 C), temperature source Oral, resp. rate 20, SpO2 99 %. Physical Exam Constitutional: He is oriented to person, place, and time.  HENT:  Head: Normocephalic.  Eyes: EOM are normal.  Neck: Normal range of motion. Neck supple. No thyromegaly present.  Cardiovascular: Normal rate and regular rhythm.  Respiratory: Effort normal and breath sounds normal. No respiratory distress.  GI: Soft. Bowel sounds are normal. He exhibits no distension.  Neurological: He is alert and oriented to person, place, and time.  Skin: Skin is warm and dry Brace in place to right lower extremity Motot 5/5 in BUE 5/5 in LLE Trace HF, 2- ankle DF/PF limited    Lab Results Last 48 Hours    Results for orders placed or performed during the hospital encounter of 02/17/15 (from the past 48 hour(s))  CBC with Differential Status: Abnormal   Collection Time: 02/17/15 10:46 PM  Result Value Ref Range   WBC 6.7 4.0 - 10.5 K/uL   RBC 4.08 (L) 4.22 - 5.81 MIL/uL   Hemoglobin 13.3 13.0 - 17.0 g/dL   HCT 39.3 39.0 - 52.0 %   MCV 96.3 78.0 -  100.0 fL   MCH 32.6 26.0 - 34.0 pg   MCHC 33.8 30.0 - 36.0 g/dL   RDW 12.8 11.5 - 15.5 %   Platelets 166 150 - 400 K/uL   Neutrophils Relative % 70 43 - 77 %   Neutro Abs 4.8 1.7 - 7.7 K/uL   Lymphocytes Relative 18 12 - 46 %   Lymphs Abs 1.2 0.7 - 4.0 K/uL   Monocytes Relative 9 3 - 12 %   Monocytes Absolute 0.6 0.1 - 1.0 K/uL   Eosinophils Relative 2 0 - 5 %   Eosinophils Absolute 0.2 0.0 - 0.7 K/uL   Basophils Relative 1 0 - 1 %   Basophils Absolute 0.0 0.0 - 0.1 K/uL  Comprehensive metabolic panel Status: Abnormal   Collection Time: 02/17/15 10:46 PM  Result Value Ref Range   Sodium 127 (L) 135 - 145 mmol/L   Potassium 4.7 3.5 - 5.1 mmol/L   Chloride 96 (L) 101 - 111 mmol/L   CO2 19 (L) 22 - 32 mmol/L   Glucose, Bld 91 65 - 99 mg/dL   BUN 13 6 - 20 mg/dL   Creatinine, Ser 1.32 (H) 0.61 - 1.24 mg/dL   Calcium 8.4 (L) 8.9 - 10.3 mg/dL   Total Protein 6.6 6.5 - 8.1 g/dL   Albumin 3.4 (L) 3.5 - 5.0 g/dL   AST 35 15 -  41 U/L   ALT 21 17 - 63 U/L   Alkaline Phosphatase 62 38 - 126 U/L   Total Bilirubin 1.0 0.3 - 1.2 mg/dL   GFR calc non Af Amer 59 (L) >60 mL/min   GFR calc Af Amer >60 >60 mL/min    Comment: (NOTE) The eGFR has been calculated using the CKD EPI equation. This calculation has not been validated in all clinical situations. eGFR's persistently <60 mL/min signify possible Chronic Kidney Disease.    Anion gap 12 5 - 15  Protime-INR Status: None   Collection Time: 02/17/15 11:22 PM  Result Value Ref Range   Prothrombin Time 14.9 11.6 - 15.2 seconds   INR 1.15 0.00 - 1.49  APTT Status: None   Collection Time: 02/17/15 11:22 PM  Result Value Ref Range   aPTT 27 24 - 37 seconds  I-stat troponin, ED (not at [facility], [facility]) Status: None   Collection Time: 02/17/15 11:44 PM  Result Value Ref  Range   Troponin i, poc 0.00 0.00 - 0.08 ng/mL   Comment 3       Comment: Due to the release kinetics of cTnI, a negative result within the first hours of the onset of symptoms does not rule out myocardial infarction with certainty. If myocardial infarction is still suspected, repeat the test at appropriate intervals.   Comprehensive metabolic panel Status: Abnormal   Collection Time: 02/18/15 4:15 AM  Result Value Ref Range   Sodium 134 (L) 135 - 145 mmol/L    Comment: DELTA CHECK NOTED   Potassium 3.9 3.5 - 5.1 mmol/L    Comment: DELTA CHECK NOTED NO VISIBLE HEMOLYSIS    Chloride 104 101 - 111 mmol/L   CO2 21 (L) 22 - 32 mmol/L   Glucose, Bld 103 (H) 65 - 99 mg/dL   BUN 10 6 - 20 mg/dL   Creatinine, Ser 1.17 0.61 - 1.24 mg/dL   Calcium 7.7 (L) 8.9 - 10.3 mg/dL   Total Protein 5.6 (L) 6.5 - 8.1 g/dL   Albumin 2.7 (L) 3.5 - 5.0 g/dL   AST 24 15 - 41 U/L   ALT 16 (L) 17 - 63 U/L   Alkaline Phosphatase 48 38 - 126 U/L   Total Bilirubin 0.7 0.3 - 1.2 mg/dL   GFR calc non Af Amer >60 >60 mL/min   GFR calc Af Amer >60 >60 mL/min    Comment: (NOTE) The eGFR has been calculated using the CKD EPI equation. This calculation has not been validated in all clinical situations. eGFR's persistently <60 mL/min signify possible Chronic Kidney Disease.    Anion gap 9 5 - 15  CBC with Differential/Platelet Status: Abnormal   Collection Time: 02/18/15 4:15 AM  Result Value Ref Range   WBC 7.8 4.0 - 10.5 K/uL   RBC 3.42 (L) 4.22 - 5.81 MIL/uL   Hemoglobin 11.3 (L) 13.0 - 17.0 g/dL   HCT 33.4 (L) 39.0 - 52.0 %   MCV 97.7 78.0 - 100.0 fL   MCH 33.0 26.0 - 34.0 pg   MCHC 33.8 30.0 - 36.0 g/dL   RDW 12.8 11.5 - 15.5 %   Platelets 122 (L) 150 - 400 K/uL   Neutrophils Relative % 80 (H) 43 - 77 %   Neutro Abs 6.3 1.7 - 7.7 K/uL    Lymphocytes Relative 11 (L) 12 - 46 %   Lymphs Abs 0.9 0.7 - 4.0 K/uL   Monocytes Relative 8 3 - 12 %   Monocytes Absolute  0.6 0.1 - 1.0 K/uL   Eosinophils Relative 1 0 - 5 %   Eosinophils Absolute 0.1 0.0 - 0.7 K/uL   Basophils Relative 0 0 - 1 %   Basophils Absolute 0.0 0.0 - 0.1 K/uL  Troponin I (q 6hr x 3) Status: None   Collection Time: 02/18/15 4:15 AM  Result Value Ref Range   Troponin I <0.03 <0.031 ng/mL    Comment:   NO INDICATION OF MYOCARDIAL INJURY.   Troponin I (q 6hr x 3) Status: None   Collection Time: 02/18/15 10:03 AM  Result Value Ref Range   Troponin I <0.03 <0.031 ng/mL    Comment:   NO INDICATION OF MYOCARDIAL INJURY.   Troponin I (q 6hr x 3) Status: None   Collection Time: 02/18/15 4:15 PM  Result Value Ref Range   Troponin I <0.03 <0.031 ng/mL    Comment:   NO INDICATION OF MYOCARDIAL INJURY.   CBC Status: Abnormal   Collection Time: 02/19/15 5:47 AM  Result Value Ref Range   WBC 5.3 4.0 - 10.5 K/uL   RBC 3.43 (L) 4.22 - 5.81 MIL/uL   Hemoglobin 11.1 (L) 13.0 - 17.0 g/dL   HCT 33.6 (L) 39.0 - 52.0 %   MCV 98.0 78.0 - 100.0 fL   MCH 32.4 26.0 - 34.0 pg   MCHC 33.0 30.0 - 36.0 g/dL   RDW 13.1 11.5 - 15.5 %   Platelets 107 (L) 150 - 400 K/uL    Comment: PLATELET COUNT CONFIRMED BY SMEAR  Basic metabolic panel Status: Abnormal   Collection Time: 02/19/15 5:47 AM  Result Value Ref Range   Sodium 135 135 - 145 mmol/L   Potassium 3.7 3.5 - 5.1 mmol/L   Chloride 104 101 - 111 mmol/L   CO2 23 22 - 32 mmol/L   Glucose, Bld 95 65 - 99 mg/dL   BUN <5 (L) 6 - 20 mg/dL   Creatinine, Ser 0.89 0.61 - 1.24 mg/dL   Calcium 7.9 (L) 8.9 - 10.3 mg/dL   GFR calc non Af Amer >60 >60 mL/min   GFR calc Af Amer >60 >60 mL/min    Comment: (NOTE) The  eGFR has been calculated using the CKD EPI equation. This calculation has not been validated in all clinical situations. eGFR's persistently <60 mL/min signify possible Chronic Kidney Disease.    Anion gap 8 5 - 15      Imaging Results (Last 48 hours)    Dg Tibia/fibula Right  02/17/2015 CLINICAL DATA: Further evaluation of known tibial fracture. Initial encounter. EXAM: RIGHT TIBIA AND FIBULA - 2 VIEW COMPARISON: Right tibia/fibula radiographs performed 12/09/2012 FINDINGS: As noted on recent prior studies, there is a comminuted oblique slightly displaced fracture extending across the proximal tibial metadiaphysis. An associated moderate knee joint effusion is noted. There is also a minimally displaced fracture involving the proximal fibula. Distal fibular hardware appears grossly intact, without evidence of loosening. Distal tibial screws are also grossly unremarkable. Lateral soft tissue swelling is noted at the ankle. The ankle mortise is grossly unremarkable. IMPRESSION: 1. Comminuted oblique slightly displaced fracture extending across the proximal tibial metadiaphysis, with associated moderate knee joint effusion. 2. Minimally displaced fracture involving the proximal fibula.  Electronically Signed By: Garald Balding M.D. On: 02/17/2015 23:24   Ct Head Wo Contrast  02/18/2015 CLINICAL DATA: Syncopal episode earlier today. Similar episode 2 weeks prior EXAM: CT HEAD WITHOUT CONTRAST TECHNIQUE: Contiguous axial images were obtained from the base of the skull through the  vertex without intravenous contrast. COMPARISON: Head CT September 06, 2013. Brain MRI September 02, 2013 FINDINGS: Mild generalized atrophy is stable. There is no demonstrable mass, hemorrhage, extra-axial fluid collection, or midline shift. There is decreased attenuation in the inferior left centrum semiovale with extension inferiorly to the posterior aspect of the posterior limb of the left internal  capsule consistent with prior infarct. Elsewhere, there is minimal periventricular small vessel disease. No acute infarct appreciable. : Bony calvarium appears intact. The mastoid air cells are clear. IMPRESSION: Atrophy with prior infarct involving the inferior left centrum semiovale extending inferiorly into the posterior aspect of the posterior limb of the left internal capsule. There is minimal periventricular small vessel disease. No acute appearing infarct. No mass or hemorrhage. Electronically Signed By: Lowella Grip III M.D. On: 02/18/2015 01:02   Dg Chest Port 1 View  02/18/2015 CLINICAL DATA: Acute onset of exertional dyspnea. Diaphoresis. Near syncope. Initial encounter. EXAM: PORTABLE CHEST - 1 VIEW COMPARISON: Chest radiograph performed 09/10/2013 FINDINGS: The lungs are well-aerated. Minimal left basilar opacity likely reflects atelectasis. There is no evidence of pleural effusion or pneumothorax. The cardiomediastinal silhouette is within normal limits. No acute osseous abnormalities are seen. IMPRESSION: Minimal left basilar opacity likely reflects atelectasis. Lungs otherwise clear. Electronically Signed By: Garald Balding M.D. On: 02/18/2015 00:23   Dg Knee Complete 4 Views Right  02/17/2015 CLINICAL DATA: Pain following fall earlier today EXAM: RIGHT KNEE - COMPLETE 4+ VIEW COMPARISON: June 30, 2013 FINDINGS: Frontal, lateral, and bilateral oblique views were obtained. There is a comminuted fracture of the proximal tibia with fractures extending from the lateral aspect of the medial tibial plateau into the proximal tibial diaphysis. There also fractures in the proximal tibial metaphysis and medial tibial diaphysis. Alignment in these areas of fracture are near anatomic. No dislocation. There is a fat - fluid level in the suprapatellar bursa consistent with the fractures. No appreciable joint space narrowing. IMPRESSION: Comminuted fractures of the  proximal tibia with major fracture fragments in overall near anatomic alignment. Fat-fluid level in the suprapatellar bursa consistent with the fractures. No dislocation. No appreciable joint space narrowing. Electronically Signed By: Lowella Grip III M.D. On: 02/17/2015 21:20        Medical Problem List and Plan: 1. Functional deficits secondary to right proximal tibia fracture. Conservative care knee brace at all times nonweightbearing 2. DVT Prophylaxis/Anticoagulation: Lovenox. Monitor for any bleeding episodes. Check vascular 3. Pain Management: MSIR as needed as well as Robaxin 4. Mood/anxiety: Prozac 20 mg daily. Provide emotional support 5. Neuropsych: This patient is capable of making decisions on his own behalf. 6. Skin/Wound Care: Routine skin checks 7. Fluids/Electrolytes/Nutrition: Routine I&O with follow-up chemistries 8. History of CVA February 2015. Continue aspirin therapy 9. Hypertension. Lopressor 50 mg twice a day. Monitor with increased mobility 10. History of prostate cancer. Status post radical prostatectomy 11. Hyperlipidemia. Zocor 12. History of alcohol. Monitor for any withdrawal  Post Admission Physician Evaluation: Functional deficits secondary to right proximal tibia fracture. Conservative care knee brace at all times nonweightbearing 1. . 2. Patient is admitted to receive collaborative, interdisciplinary care between the physiatrist, rehab nursing staff, and therapy team. 3. Patient's level of medical complexity and substantial therapy needs in context of that medical necessity cannot be provided at a lesser intensity of care such as a SNF. 4. Patient has experienced substantial functional loss from his/her baseline which was documented above under the "Functional History" and "Functional Status" headings. Judging by the patient's diagnosis, physical exam, and  functional history, the patient has potential for functional progress which will result  in measurable gains while on inpatient rehab. These gains will be of substantial and practical use upon discharge in facilitating mobility and self-care at the household level. 5. Physiatrist will provide 24 hour management of medical needs as well as oversight of the therapy plan/treatment and provide guidance as appropriate regarding the interaction of the two. 6. 24 hour rehab nursing will assist with bladder management, bowel management, safety, skin/wound care, disease management, medication administration, pain management and patient education and help integrate therapy concepts, techniques,education, etc. 7. PT will assess and treat for/with: pre gait, gait training, endurance , safety, equipment, neuromuscular re education. Goals are: Mod I. 8. OT will assess and treat for/with: ADLs, Cognitive perceptual skills, Neuromuscular re education, safety, endurance, equipment. Goals are: Mod I. Therapy may proceed with showering this patient. 9. SLP will assess and treat for/with: NA. Goals are: NA. 10. Case Management and Social Worker will assess and treat for psychological issues and discharge planning. 11. Team conference will be held weekly to assess progress toward goals and to determine barriers to discharge. 12. Patient will receive at least 3 hours of therapy per day at least 5 days per week. 13. ELOS: 5-7days  14. Prognosis: excellent     [name] M.D. [facility] FAAPM&R (Sports Med, Neuromuscular Med) Diplomate Am Board of Electrodiagnostic Med  02/19/2015

## 2015-02-19 NOTE — PMR Pre-admission (Signed)
PMR Admission Coordinator Pre-Admission Assessment  Patient: Tanner [name] is an 59 y.o., male MRN: [id number] DOB: [birth year] Height:   Weight:                Insurance Information HMO:No   PPO:       PCP:       IPA:       80/20:       OTHER:   PRIMARY: Medicare A/B      Policy#: [id number]      Subscriber: Gabriel Earing CM Name:        Phone#:       Fax#:   Pre-Cert#:        Employer: Disabled Benefits:  Phone #:       Name: Checked in [place]. Date: 01/07/14     Deduct:  $1288      Out of Pocket Max: none      Life Max: unlimited CIR: 100%      SNF: 100 days Outpatient: 80%     Co-Pay: 20% Home Health: 100%      Co-Pay: none DME: 80%     Co-Pay: 20% Providers: patient's choice  Emergency Contact Information Contact Information    Name Relation Home Work [place] Daughter [phone]     Allred,Betty Sister [phone]  [phone]   Peoples,Tempie Sister [phone]     Sandie Ano   [phone]     Current Medical History  Patient Admitting Diagnosis:  R tibia fracture, old CVA  History of Present Illness: A 59 y.o. right handed male with history of alcoholism, hypertension, prostate cancer with radical prostatectomy with postop CVA and right sided weakness maintained on aspirin. Patient did receive inpatient rehabilitation services after CVA February 2015. He lives with his family and assistance as needed independent without assistive device prior to admission. Presented 02/18/2015 after a fall trying to open a door with right lower extremity pain. Noted in the ED patient with brief loss of consciousness becoming diaphoretic. EKG normal sinus rhythm. CT of the head showed no acute appearing infarct. X-rays and imaging of right lower extremity showed right proximal tibia fracture. Orthopedic services Dr. Rhona Raider consulted advised no surgical intervention. Placed in a knee brace nonweightbearing for 2 months. Hospital course pain management.  Subcutaneous Lovenox for DVT prophylaxis. Physical therapy evaluation completed 02/19/2015 with recommendations of physical medicine rehabilitation consult.  Patient has been living with his daughter but has not required physical assistance from her. She goes to college but lives in the same home. She'll be starting college next week but will be able to assist some days more than others     Past Medical History  Past Medical History  Diagnosis Date  . Alcoholic hepatitis   . Hypertension   . 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 Shortness of breath     new onset- occasionally-at rest and exertion  . GERD (gastroesophageal reflux disease)   . Anxiety   . TIA (transient ischemic attack) 08/2013  . Arthritis     " in my spine "  . Ileus 09/2013    Family History  family history includes Lung cancer in his father; Pancreatic cancer in his sister.  Prior Rehab/Hospitalizations:  Has the patient had major surgery during 100 days prior to admission? No  Current Medications   Current facility-administered medications:  .  acetaminophen (TYLENOL) tablet 650 mg, 650 mg, Oral, Q6H PRN, 650 mg at 02/19/15 1542 **OR** acetaminophen (TYLENOL) suppository  650 mg, 650 mg, Rectal, Q6H PRN, Rise Patience, MD .  acetaminophen-codeine (TYLENOL #3) 300-30 MG per tablet 1-2 tablet, 1-2 tablet, Oral, Q4H PRN, Eugenie Filler, MD, 2 tablet at 02/18/15 1719 .  aspirin EC tablet 325 mg, 325 mg, Oral, Daily, Rise Patience, MD, 325 mg at 02/19/15 1038 .  enoxaparin (LOVENOX) injection 40 mg, 40 mg, Subcutaneous, Q24H, Rise Patience, MD, 40 mg at 02/19/15 1039 .  FLUoxetine (PROZAC) capsule 20 mg, 20 mg, Oral, Daily, Rise Patience, MD, 20 mg at 02/19/15 1038 .  folic acid (FOLVITE) tablet 1 mg, 1 mg, Oral, Daily, Rise Patience, MD, 1 mg at 02/19/15 1038 .  lisinopril (PRINIVIL,ZESTRIL) tablet 40 mg, 40 mg, Oral, Daily, Rise Patience, MD, 40 mg at 02/19/15 1038 .   LORazepam (ATIVAN) tablet 1 mg, 1 mg, Oral, Q6H PRN **OR** LORazepam (ATIVAN) injection 1 mg, 1 mg, Intravenous, Q6H PRN, Rise Patience, MD .  LORazepam (ATIVAN) tablet 0-4 mg, 0-4 mg, Oral, Q6H, 0 mg at 02/18/15 0532 **FOLLOWED BY** [START ON 02/20/2015] LORazepam (ATIVAN) tablet 0-4 mg, 0-4 mg, Oral, Q12H, Rise Patience, MD .  metoprolol (LOPRESSOR) tablet 50 mg, 50 mg, Oral, BID, Rise Patience, MD, 50 mg at 02/19/15 1038 .  morphine (MSIR) tablet 15-30 mg, 15-30 mg, Oral, Q4H PRN, Eugenie Filler, MD, 15 mg at 02/19/15 1318 .  morphine 4 MG/ML injection 4 mg, 4 mg, Intravenous, Q3H PRN, Irine Seal V, MD, 4 mg at 02/19/15 1037 .  multivitamin with minerals tablet 1 tablet, 1 tablet, Oral, Daily, Rise Patience, MD, 1 tablet at 02/19/15 1038 .  pantoprazole (PROTONIX) EC tablet 40 mg, 40 mg, Oral, Daily, Rise Patience, MD, 40 mg at 02/19/15 1038 .  simvastatin (ZOCOR) tablet 20 mg, 20 mg, Oral, q1800, Rise Patience, MD, 20 mg at 02/18/15 1719 .  thiamine (VITAMIN B-1) tablet 100 mg, 100 mg, Oral, Daily, 100 mg at 02/19/15 1038 **OR** [DISCONTINUED] thiamine (B-1) injection 100 mg, 100 mg, Intravenous, Daily, Rise Patience, MD .  traZODone (DESYREL) tablet 50 mg, 50 mg, Oral, QHS PRN, Rise Patience, MD  Patients Current Diet: Diet Heart Room service appropriate?: Yes; Fluid consistency:: Thin  Precautions / Restrictions Precautions Precautions: Fall Restrictions Weight Bearing Restrictions: Yes RLE Weight Bearing: Non weight bearing   Has the patient had 2 or more falls or a fall with injury in the past year?No  Prior Activity Level Community (5-7x/wk): Went out daily.  Disabled. Not working.  Was driving  Development worker, international aid / [place] Devices/Equipment: Environmental consultant (specify type), Crutches, Cane (specify quad or straight) Home Equipment: Walker - 2 wheels, Cane - quad, Crutches, Bedside commode, Shower seat  Prior  Device Use: Indicate devices/aids used by the patient prior to current illness, exacerbation or injury? None of the above.  Used a cane after rehab last year, but no device most recently.  Prior Functional Level Prior Function Level of Independence: Independent (slow gait) Comments: cane  Self Care: Did the patient need help bathing, dressing, using the toilet or eating?  Independent  Indoor Mobility: Did the patient need assistance with walking from room to room (with or without device)? Independent  Stairs: Did the patient need assistance with internal or external stairs (with or without device)? Independent  Functional Cognition: Did the patient need help planning regular tasks such as shopping or remembering to take medications? Independent  Current Functional Level Cognition  Overall Cognitive  Status: Within Functional Limits for tasks assessed    Extremity Assessment (includes Sensation/Coordination)  Upper Extremity Assessment: Overall WFL for tasks assessed  Lower Extremity Assessment: Defer to PT evaluation RLE Deficits / Details: limited strength, reports due to pain    ADLs  Overall ADL's : Needs assistance/impaired Eating/Feeding: Set up, Bed level Grooming: Set up, Bed level Upper Body Bathing: Minimal assitance, Sitting Lower Body Bathing: Total assistance, Sit to/from stand, Cueing for safety Upper Body Dressing : Minimal assistance, Sitting Lower Body Dressing: Sit to/from stand, Total assistance, Cueing for safety Toilet Transfer Details (indicate cue type and reason): Did not occur secondary to increased fatigue and dizziness in sitting and standing positions General ADL Comments: Pt limited secondary to pain, dizziness upon sitting/standing, and increased fatigue. Pt required heavy min assist to sit EOB, therapist providing 100% assistance with RLE (pt unable). Pt stood EOB with mod assist using RW, cues for hand placement and technique to adhere to NWB>RLE. Pt  unsafe to d/c home alone. Will benefit from CIR.     Mobility  Overal bed mobility: Needs Assistance Bed Mobility: Supine to Sit, Sit to Supine Supine to sit: Min assist Sit to supine: Min assist General bed mobility comments: Heavy use of rails and A to manage R LE    Transfers  Overall transfer level: Needs assistance Equipment used: Rolling walker (2 wheeled) Transfers: Stand Pivot Transfers Sit to Stand: Mod assist, +2 safety/equipment Stand pivot transfers: +2 physical assistance, Min assist General transfer comment: Patient stated he was initially dizzy with sitting EOB but subsided and agreeable to transfer to recliner. +2 assist to SPT for safety and to ensure R LE NWB and balance. Patient able to take some pivotal hops to reclienr    Ambulation / Gait / Stairs / Emergency planning/management officer  Ambulation/Gait General Gait Details: unable to attempt    Posture / Balance Balance Overall balance assessment: Needs assistance Sitting-balance support: No upper extremity supported, Single extremity supported Sitting balance-Leahy Scale: Fair Standing balance support: Bilateral upper extremity supported, During functional activity Standing balance-Leahy Scale: Poor    Special needs/care consideration BiPAP/CPAP No CPM No Continuous Drip IV No Dialysis No      Life Vest No Oxygen No Special Bed No Trach Size No Wound Vac (area) No       Skin Has large skin tear on left elbow with dressing.  Has a knee brace on right leg.                Bowel mgmt: Last BM 02/17/15 PTA per patient. Bladder mgmt: Voiding in urinal.  Some incontinence is documented. Diabetic mgmt No    Previous Home Environment Living Arrangements: Children, Other relatives Available Help at Discharge: Family, Friend(s) Type of Home: Mobile home Home Layout: One level Home Access: Stairs to enter Entrance Stairs-Rails: Right Entrance Stairs-Number of Steps: 4-5 Bathroom Shower/Tub: Gaffer, Clinical research associate: Oasis: No  Discharge Living Setting Plans for Discharge Living Setting: Patient's home, Lives with (comment), Mobile Home (Lives with 7 yo boyfriend.) Type of Home at Discharge: Mobile home Discharge Home Layout: One level Discharge Home Access: Stairs to enter Entrance Stairs-Number of Steps: 5 step entry Does the patient have any problems obtaining your medications?: No  Social/Family/Support Systems Patient Roles: Parent (Has a 60 yo daughter.) Contact Information: Aharon Carriere - daughter [phone] Anticipated Caregiver: daughter and sisters Ability/Limitations of Caregiver: Daughter works and is returning to school soon Caregiver Availability: Intermittent Discharge Plan Discussed  with Primary Caregiver: Yes Is Caregiver In Agreement with Plan?: Yes Does Caregiver/Family have Issues with Lodging/Transportation while Pt is in Rehab?: No  Goals/Additional Needs Patient/Family Goal for Rehab: PT/OT mod I and supervision goals Expected length of stay: 5-7 days Cultural Considerations: None Dietary Needs: Heart diet, thin liquids Equipment Needs: TBD Pt/Family Agrees to Admission and willing to participate: Yes Program Orientation Provided & Reviewed with Pt/Caregiver Including Roles  & Responsibilities: Yes  Decrease burden of Care through IP rehab admission: N/A  Possible need for SNF placement upon discharge: Not planned  Patient Condition: This patient's condition remains as documented in the consult dated 02/19/15, in which the Rehabilitation Physician determined and documented that the patient's condition is appropriate for intensive rehabilitative care in an inpatient rehabilitation facility. Will admit to inpatient rehab today.  Preadmission Screen Completed By:  Retta Diones, 02/19/2015 4:29 PM ______________________________________________________________________   Discussed status with Dr. Letta Pate on 02/19/15 at 1627 and  received telephone approval for admission today.  Admission Coordinator:  Retta Diones, time1629/Date08/12/16

## 2015-02-19 NOTE — Evaluation (Signed)
Occupational Therapy Evaluation Patient Details Name: Tanner [name] MRN: [id number] DOB: February 20, 1956 Today's Date: 02/19/2015    History of Present Illness Fracture of tibia, right, closed   Clinical Impression   Patient presenting with deconditioning, decreased ADL, IADL, functional mobility independence secondary to above. Patient independent>mod I PTA. Patient currently requires total assist for LB ADLs, min assist for UB ADLs, mod assist for sit<>stands. Patient will benefit from acute OT to increase overall independence in the areas of ADLs, functional mobility, and overall safety in order to safely discharge to venue listed below.   Pt states he currently does not have someone that can provide 24/7 supervision post acute d/c. Recommending CIR for comprehensive and interdisciplinary therapy to reach a w/c mod I level to safely go home without 24/7 assistance, believe with a ST CIR stay he can reach this. IF CIR denial, patient will need HHOT and 24/7 supervision/assistance for his safety.     Follow Up Recommendations  CIR;Supervision/Assistance - 24 hour    Equipment Recommendations  Other (comment) (AE)    Recommendations for Other Services Rehab consult   Precautions / Restrictions Precautions Precautions: Fall Required Braces or Orthoses: Knee Immobilizer - Right Knee Immobilizer - Right: On at all times Restrictions Weight Bearing Restrictions: Yes RLE Weight Bearing: Non weight bearing      Mobility Bed Mobility Overal bed mobility: Needs Assistance Bed Mobility: Supine to Sit;Sit to Supine     Supine to sit: Min assist (heavy min assist, therapist providing 100% assist > RLE) Sit to supine: Min assist   General bed mobility comments: Pt unable to manage RLE. Pt limited by dizziness, pain, and fatigue  Transfers Overall transfer level: Needs assistance Equipment used: Rolling walker (2 wheeled) Transfers: Sit to/from Stand Sit to Stand: Mod assist General  transfer comment: Mod assist to lift and lower from EOB. Pt continues to complain of dizziness upon transitional movements. Pt required mod verbal cueing for hand placement and technique to adhere to NWB>RLE.     Balance Overall balance assessment: Needs assistance Sitting-balance support: No upper extremity supported;Single extremity supported Sitting balance-Leahy Scale: Poor     Standing balance support: Bilateral upper extremity supported;During functional activity Standing balance-Leahy Scale: Poor    ADL Overall ADL's : Needs assistance/impaired Eating/Feeding: Set up;Bed level   Grooming: Set up;Bed level   Upper Body Bathing: Minimal assitance;Sitting   Lower Body Bathing: Total assistance;Sit to/from stand;Cueing for safety   Upper Body Dressing : Minimal assistance;Sitting   Lower Body Dressing: Sit to/from stand;Total assistance;Cueing for safety     Toilet Transfer Details (indicate cue type and reason): Did not occur secondary to increased fatigue and dizziness in sitting and standing positions           General ADL Comments: Pt limited secondary to pain, dizziness upon sitting/standing, and increased fatigue. Pt required heavy min assist to sit EOB, therapist providing 100% assistance with RLE (pt unable). Pt stood EOB with mod assist using RW, cues for hand placement and technique to adhere to NWB>RLE. Pt unsafe to d/c home alone. Will benefit from CIR.     Pertinent Vitals/Pain Pain Assessment: 0-10 Pain Score: 10-Worst pain ever ("12") Pain Location: RLE "under knee cap" Pain Descriptors / Indicators: Throbbing;Constant Pain Intervention(s): Limited activity within patient's tolerance;Monitored during session;Repositioned     Hand Dominance Right   Extremity/Trunk Assessment Upper Extremity Assessment Upper Extremity Assessment: Overall WFL for tasks assessed   Lower Extremity Assessment Lower Extremity Assessment: Defer to PT evaluation  Cervical  / Trunk Assessment Cervical / Trunk Assessment: Normal   Communication Communication Communication: No difficulties   Cognition Arousal/Alertness: Awake/alert Behavior During Therapy: WFL for tasks assessed/performed Overall Cognitive Status: Within Functional Limits for tasks assessed              Home Living Family/patient expects to be discharged to:: Private residence Living Arrangements: Children;Other relatives Available Help at Discharge: Family;Friend(s) Type of Home: Mobile home Home Access: Stairs to enter Entrance Stairs-Number of Steps: 4-5 Entrance Stairs-Rails: Right Home Layout: One level     Bathroom Shower/Tub: Walk-in shower;Door   [employer] Toilet: Standard     Home Equipment: Environmental consultant - 2 wheels;Cane - quad;Crutches;Bedside commode;Shower seat   Prior Functioning/Environment Level of Independence: Independent (slow gait)     OT Diagnosis: Generalized weakness;Acute pain   OT Problem List: Decreased strength;Decreased range of motion;Decreased activity tolerance;Impaired balance (sitting and/or standing);Decreased safety awareness;Pain;Decreased knowledge of precautions;Decreased knowledge of use of DME or AE   OT Treatment/Interventions: Self-care/ADL training;Therapeutic exercise;Energy conservation;DME and/or AE instruction;Therapeutic activities;Patient/family education;Balance training    OT Goals(Current goals can be found in the care plan section) Acute Rehab OT Goals Patient Stated Goal: get stronger so I can go home OT Goal Formulation: With patient Time For Goal Achievement: 03/05/15 Potential to Achieve Goals: Good ADL Goals Pt Will Perform Grooming: with modified independence;sitting Pt Will Perform Upper Body Bathing: with modified independence;Independently;sitting Pt Will Perform Lower Body Bathing: with min assist;with adaptive equipment;sit to/from stand Pt Will Perform Upper Body Dressing: Independently;sitting Pt Will Perform Lower  Body Dressing: with min assist;sit to/from stand;with adaptive equipment Pt Will Transfer to Toilet: with supervision;bedside commode;stand pivot transfer Pt Will Perform Tub/Shower Transfer: Shower transfer;shower seat;rolling walker;with min guard assist Pt/caregiver will Perform Home Exercise Program: Increased strength;Both right and left upper extremity;With Supervision;With written HEP provided;With theraband  OT Frequency: Min 2X/week   Barriers to D/C: Decreased caregiver support   End of Session Equipment Utilized During Treatment: Gait belt;Rolling walker;Right knee immobilizer  Activity Tolerance: Patient limited by fatigue Patient left: in bed;with call bell/phone within reach   Time: 0758-0821 OT Time Calculation (min): 23 min Charges:  OT General Charges $OT Visit: 1 Procedure OT Evaluation $Initial OT Evaluation Tier I: 1 Procedure OT Treatments $Self Care/Home Management : 8-22 mins  Bridgett Hattabaugh , MS, OTR/L, CLT Pager: [phone]  02/19/2015, 8:29 AM

## 2015-02-19 NOTE — Progress Notes (Signed)
Rehab Admissions Coordinator Note:  Patient was screened by Djimon Lundstrom L for appropriateness for an Inpatient Acute Rehab Consult.  At this time, we are recommending Inpatient Rehab consult. I have noticed that pt had LOC briefly with diaphoresis during CT scan while in ER. I discussed pt's case with PTA as well.  Quentavious Rittenhouse L 02/19/2015, 10:00 AM  I can be reached at [phone].

## 2015-02-19 NOTE — Progress Notes (Signed)
PT was recommending HHPT, spoke with patient and set up HHPT with [facility]. Based on patient's evaluation today, PT and OT recommending CIR. CIR evaluating patient, made CSW referral for possible SNF backup and notified Miranda at [facility] of possible change in discharge disposition. CM will continue to follow for d/c needs.

## 2015-02-19 NOTE — Progress Notes (Signed)
Rehab admissions - Evaluated for possible admission.  I met with patient and his daughter.  He was on rehab previously for 2-3 days.  He is willing to come to rehab today.  Bed available and will admit to acute inpatient rehab today.  Call me for questions.  #[phone]

## 2015-02-19 NOTE — Progress Notes (Signed)
Report called to [place], ext [phone] - provided update, answered all questions.

## 2015-02-20 ENCOUNTER — Inpatient Hospital Stay (HOSPITAL_COMMUNITY): Payer: Medicare Other | Admitting: *Deleted

## 2015-02-20 ENCOUNTER — Inpatient Hospital Stay (HOSPITAL_COMMUNITY)
Admission: RE | Admit: 2015-02-20 | Discharge: 2015-03-02 | DRG: 560 | Disposition: A | Discharge status: Home Health | Payer: Medicare Other | Source: Intra-hospital | Attending: Physical Medicine & Rehabilitation | Admitting: Physical Medicine & Rehabilitation

## 2015-02-20 ENCOUNTER — Encounter (HOSPITAL_COMMUNITY): Payer: Self-pay | Admitting: *Deleted

## 2015-02-20 ENCOUNTER — Inpatient Hospital Stay (HOSPITAL_COMMUNITY): Payer: Medicare Other | Admitting: Occupational Therapy

## 2015-02-20 ENCOUNTER — Inpatient Hospital Stay (HOSPITAL_BASED_OUTPATIENT_CLINIC_OR_DEPARTMENT_OTHER): Payer: Medicare Other

## 2015-02-20 DIAGNOSIS — K5900 Constipation, unspecified: Secondary | ICD-10-CM | POA: Diagnosis not present

## 2015-02-20 DIAGNOSIS — Z8546 Personal history of malignant neoplasm of prostate: Secondary | ICD-10-CM | POA: Diagnosis not present

## 2015-02-20 DIAGNOSIS — S50312D Abrasion of left elbow, subsequent encounter: Secondary | ICD-10-CM

## 2015-02-20 DIAGNOSIS — E785 Hyperlipidemia, unspecified: Secondary | ICD-10-CM | POA: Diagnosis present

## 2015-02-20 DIAGNOSIS — F1010 Alcohol abuse, uncomplicated: Secondary | ICD-10-CM | POA: Diagnosis not present

## 2015-02-20 DIAGNOSIS — Z86718 Personal history of other venous thrombosis and embolism: Secondary | ICD-10-CM

## 2015-02-20 DIAGNOSIS — I69351 Hemiplegia and hemiparesis following cerebral infarction affecting right dominant side: Secondary | ICD-10-CM

## 2015-02-20 DIAGNOSIS — S82201S Unspecified fracture of shaft of right tibia, sequela: Secondary | ICD-10-CM

## 2015-02-20 DIAGNOSIS — D649 Anemia, unspecified: Secondary | ICD-10-CM | POA: Diagnosis present

## 2015-02-20 DIAGNOSIS — I82401 Acute embolism and thrombosis of unspecified deep veins of right lower extremity: Secondary | ICD-10-CM | POA: Diagnosis present

## 2015-02-20 DIAGNOSIS — D62 Acute posthemorrhagic anemia: Secondary | ICD-10-CM | POA: Diagnosis not present

## 2015-02-20 DIAGNOSIS — M7989 Other specified soft tissue disorders: Secondary | ICD-10-CM

## 2015-02-20 DIAGNOSIS — F419 Anxiety disorder, unspecified: Secondary | ICD-10-CM | POA: Diagnosis present

## 2015-02-20 DIAGNOSIS — S82101D Unspecified fracture of upper end of right tibia, subsequent encounter for closed fracture with routine healing: Secondary | ICD-10-CM | POA: Diagnosis present

## 2015-02-20 DIAGNOSIS — I82441 Acute embolism and thrombosis of right tibial vein: Secondary | ICD-10-CM | POA: Diagnosis not present

## 2015-02-20 DIAGNOSIS — F411 Generalized anxiety disorder: Secondary | ICD-10-CM | POA: Diagnosis not present

## 2015-02-20 DIAGNOSIS — L089 Local infection of the skin and subcutaneous tissue, unspecified: Secondary | ICD-10-CM

## 2015-02-20 DIAGNOSIS — F10231 Alcohol dependence with withdrawal delirium: Secondary | ICD-10-CM | POA: Diagnosis not present

## 2015-02-20 DIAGNOSIS — S82209S Unspecified fracture of shaft of unspecified tibia, sequela: Secondary | ICD-10-CM | POA: Diagnosis not present

## 2015-02-20 DIAGNOSIS — I10 Essential (primary) hypertension: Secondary | ICD-10-CM | POA: Diagnosis present

## 2015-02-20 DIAGNOSIS — S82209A Unspecified fracture of shaft of unspecified tibia, initial encounter for closed fracture: Secondary | ICD-10-CM | POA: Diagnosis present

## 2015-02-20 DIAGNOSIS — S82201D Unspecified fracture of shaft of right tibia, subsequent encounter for closed fracture with routine healing: Secondary | ICD-10-CM | POA: Diagnosis not present

## 2015-02-20 DIAGNOSIS — F10230 Alcohol dependence with withdrawal, uncomplicated: Secondary | ICD-10-CM | POA: Diagnosis not present

## 2015-02-20 MED ORDER — SIMVASTATIN 20 MG PO TABS
20.0000 mg | ORAL_TABLET | Freq: Every day | ORAL | Status: DC
  Administered 2015-02-20 – 2015-03-01 (×10): 20 mg via ORAL
  Filled 2015-02-20 (×11): qty 1

## 2015-02-20 MED ORDER — PANTOPRAZOLE SODIUM 40 MG PO TBEC
40.0000 mg | DELAYED_RELEASE_TABLET | Freq: Every day | ORAL | Status: DC
  Administered 2015-02-20 – 2015-03-02 (×11): 40 mg via ORAL
  Filled 2015-02-20 (×11): qty 1

## 2015-02-20 MED ORDER — MORPHINE SULFATE 15 MG PO TABS
15.0000 mg | ORAL_TABLET | ORAL | Status: DC | PRN
  Administered 2015-02-20: 15 mg via ORAL
  Filled 2015-02-20 (×2): qty 1

## 2015-02-20 MED ORDER — ACETAMINOPHEN 650 MG RE SUPP: 650.0000 mg | Freq: Four times a day (QID) | RECTAL | Status: DC | PRN

## 2015-02-20 MED ORDER — METOPROLOL TARTRATE 50 MG PO TABS
50.0000 mg | ORAL_TABLET | Freq: Two times a day (BID) | ORAL | Status: DC
  Administered 2015-02-20 – 2015-03-02 (×21): 50 mg via ORAL
  Filled 2015-02-20 (×20): qty 1

## 2015-02-20 MED ORDER — FLUOXETINE HCL 20 MG PO CAPS
20.0000 mg | ORAL_CAPSULE | Freq: Every day | ORAL | Status: DC
  Administered 2015-02-20 – 2015-03-02 (×11): 20 mg via ORAL
  Filled 2015-02-20 (×11): qty 1

## 2015-02-20 MED ORDER — ENOXAPARIN SODIUM 40 MG/0.4ML ~~LOC~~ SOLN
40.0000 mg | SUBCUTANEOUS | Status: DC
  Administered 2015-02-20: 40 mg via SUBCUTANEOUS
  Filled 2015-02-20: qty 0.4

## 2015-02-20 MED ORDER — ADULT MULTIVITAMIN W/MINERALS CH
1.0000 | ORAL_TABLET | Freq: Every day | ORAL | Status: DC
  Administered 2015-02-20 – 2015-02-28 (×6): 1 via ORAL
  Filled 2015-02-20 (×10): qty 1

## 2015-02-20 MED ORDER — ASPIRIN EC 325 MG PO TBEC
325.0000 mg | DELAYED_RELEASE_TABLET | Freq: Every day | ORAL | Status: DC
  Administered 2015-02-20 – 2015-03-01 (×10): 325 mg via ORAL
  Filled 2015-02-20 (×10): qty 1

## 2015-02-20 MED ORDER — ACETAMINOPHEN 325 MG PO TABS: 650.0000 mg | ORAL_TABLET | Freq: Four times a day (QID) | ORAL | Status: DC | PRN

## 2015-02-20 MED ORDER — ONDANSETRON HCL 4 MG PO TABS: 4.0000 mg | ORAL_TABLET | Freq: Four times a day (QID) | ORAL | Status: DC | PRN

## 2015-02-20 MED ORDER — PROMETHAZINE HCL 12.5 MG PO TABS
25.0000 mg | ORAL_TABLET | Freq: Four times a day (QID) | ORAL | Status: DC | PRN
  Administered 2015-02-20 – 2015-02-28 (×13): 25 mg via ORAL
  Administered 2015-03-01: 50 mg via ORAL
  Filled 2015-02-20 (×3): qty 2
  Filled 2015-02-20: qty 4
  Filled 2015-02-20 (×10): qty 2

## 2015-02-20 MED ORDER — ONDANSETRON HCL 4 MG/2ML IJ SOLN: 4.0000 mg | Freq: Four times a day (QID) | INTRAMUSCULAR | Status: DC | PRN

## 2015-02-20 MED ORDER — BACITRACIN-NEOMYCIN-POLYMYXIN OINTMENT TUBE
TOPICAL_OINTMENT | Freq: Every day | CUTANEOUS | Status: DC
  Administered 2015-02-20 – 2015-03-02 (×11): via TOPICAL
  Filled 2015-02-20 (×3): qty 15

## 2015-02-20 MED ORDER — ENOXAPARIN SODIUM 40 MG/0.4ML ~~LOC~~ SOLN: 40.0000 mg | Freq: Once | SUBCUTANEOUS | Status: DC

## 2015-02-20 MED ORDER — ACETAMINOPHEN-CODEINE #3 300-30 MG PO TABS
1.0000 | ORAL_TABLET | Freq: Four times a day (QID) | ORAL | Status: DC | PRN
  Administered 2015-02-20 – 2015-02-22 (×7): 2 via ORAL
  Administered 2015-02-22 (×2): 1 via ORAL
  Administered 2015-02-23 – 2015-02-28 (×16): 2 via ORAL
  Filled 2015-02-20 (×3): qty 2
  Filled 2015-02-20: qty 1
  Filled 2015-02-20 (×13): qty 2
  Filled 2015-02-20: qty 1
  Filled 2015-02-20 (×10): qty 2

## 2015-02-20 MED ORDER — FOLIC ACID 1 MG PO TABS
1.0000 mg | ORAL_TABLET | Freq: Every day | ORAL | Status: DC
  Administered 2015-02-20 – 2015-03-02 (×10): 1 mg via ORAL
  Filled 2015-02-20 (×11): qty 1

## 2015-02-20 MED ORDER — LISINOPRIL 40 MG PO TABS
40.0000 mg | ORAL_TABLET | Freq: Every day | ORAL | Status: DC
  Administered 2015-02-20 – 2015-03-02 (×11): 40 mg via ORAL
  Filled 2015-02-20 (×11): qty 1

## 2015-02-20 MED ORDER — CETYLPYRIDINIUM CHLORIDE 0.05 % MT LIQD
7.0000 mL | Freq: Two times a day (BID) | OROMUCOSAL | Status: DC
  Administered 2015-02-20 – 2015-03-02 (×19): 7 mL via OROMUCOSAL

## 2015-02-20 MED ORDER — TRAZODONE HCL 50 MG PO TABS
50.0000 mg | ORAL_TABLET | Freq: Every evening | ORAL | Status: DC | PRN
  Administered 2015-02-21 – 2015-03-01 (×8): 50 mg via ORAL
  Filled 2015-02-20 (×8): qty 1

## 2015-02-20 MED ORDER — ENOXAPARIN SODIUM 120 MG/0.8ML ~~LOC~~ SOLN
1.5000 mg/kg | SUBCUTANEOUS | Status: DC
  Administered 2015-02-21 – 2015-02-22 (×2): 120 mg via SUBCUTANEOUS
  Filled 2015-02-20 (×3): qty 1.6
  Filled 2015-02-20: qty 0.8

## 2015-02-20 MED ORDER — VITAMIN B-1 100 MG PO TABS
100.0000 mg | ORAL_TABLET | Freq: Every day | ORAL | Status: DC
  Administered 2015-02-20 – 2015-03-02 (×10): 100 mg via ORAL
  Filled 2015-02-20 (×11): qty 1

## 2015-02-20 MED ORDER — ENOXAPARIN SODIUM 40 MG/0.4ML ~~LOC~~ SOLN: 40.0000 mg | SUBCUTANEOUS | Status: DC

## 2015-02-20 MED ORDER — SORBITOL 70 % SOLN
30.0000 mL | Freq: Every day | Status: DC | PRN
Start: 2015-02-20 — End: 2015-03-02
  Administered 2015-02-21 – 2015-03-01 (×3): 30 mL via ORAL
  Filled 2015-02-20 (×3): qty 30

## 2015-02-20 MED ORDER — ENOXAPARIN SODIUM 80 MG/0.8ML ~~LOC~~ SOLN
80.0000 mg | Freq: Once | SUBCUTANEOUS | Status: AC
  Administered 2015-02-20: 80 mg via SUBCUTANEOUS
  Filled 2015-02-20: qty 0.8

## 2015-02-20 NOTE — Evaluation (Addendum)
Physical Therapy Assessment and Plan  Patient Details  Name: Tanner [name] MRN: [id number] Date of Birth: [birth year]  PT Diagnosis: Difficulty walking, Hemiparesis dominant, Impaired sensation and Pain in joint Rehab Potential: Good ELOS: 7-10 days   Today's Date: 02/20/2015 PT Individual Time: 0900-1030 and 14:40-15:20 (69mn)  PT Individual Time Calculation (min): 90 min    Problem List:  Patient Active Problem List   Diagnosis Date Noted  . Tibia fracture 02/20/2015  . Tibial fracture 02/18/2015  . Syncope 02/18/2015  . Essential hypertension 02/18/2015  . History of stroke 02/18/2015  . Fracture of tibia, right, closed   . C. difficile colitis 09/11/2013  . UTI (urinary tract infection) 09/11/2013  . Essential hypertension, benign 09/11/2013  . Ileus 09/10/2013  . Spastic hemiplegia affecting dominant side 09/09/2013  . Ileus, postoperative 09/09/2013  . Hypokalemia 09/09/2013  . Nonspecific (abnormal) findings on radiological and other examination of gastrointestinal tract 09/09/2013  . CVA (cerebral infarction) 09/05/2013  . 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 Malignant neoplasm of prostate 08/29/2013  . Prostate cancer 05/22/2013  . Hyponatremia 12/11/2012    Class: Chronic  . Ankle fracture 12/10/2012    Past Medical History:  Past Medical History  Diagnosis Date  . Alcoholic hepatitis   . Hypertension   . 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 Shortness of breath     new onset- occasionally-at rest and exertion  . GERD (gastroesophageal reflux disease)   . Anxiety   . TIA (transient ischemic attack) 08/2013  . Arthritis     " in my spine "  . Ileus 09/2013   Past Surgical History:  Past Surgical History  Procedure Laterality Date  . Hand surgery Right   . Hernia repair    . Orif ankle fracture Right 12/10/2012    Procedure: OPEN REDUCTION INTERNAL FIXATION (ORIF) ANKLE FRACTURE;  Surgeon: PHessie Dibble MD;  Location: [facility];   Service: Orthopedics;  Laterality: Right;  . Robot assisted laparoscopic radical prostatectomy N/A 08/29/2013    Procedure: ROBOTIC ASSISTED LAPAROSCOPIC RADICAL PROSTATECTOMY LEVEL 2, Lysis of adhesions;  Surgeon: DMolli Hazard MD;  Location: [facility];  Service: Urology;  Laterality: N/A;  . Lymphadenectomy Bilateral 08/29/2013    Procedure: LYMPHADENECTOMY "BILATERAL PELVIC LYMPH NODE DISSECTION";  Surgeon: DMolli Hazard MD;  Location: [facility];  Service: Urology;  Laterality: Bilateral;    Assessment & Plan Clinical Impression: [name] a 59y.o. right handed male with history of alcoholism, hypertension, prostate cancer with radical prostatectomy with postop CVA and right sided weakness maintained on aspirin. Patient did receive inpatient rehabilitation services after CVA February 2015. He lives with his family and assistance as needed independent without assistive device prior to admission. Presented 02/18/2015 after a fall trying to open a door with right lower extremity pain. Noted in the ED patient with brief loss of consciousness becoming diaphoretic. EKG normal sinus rhythm. CT of the head showed no acute appearing infarct. X-rays and imaging of right lower extremity showed right proximal tibia fracture. Orthopedic services Dr. DRhona Raiderconsulted advised no surgical intervention. Placed in a knee brace nonweightbearing for 2 months. Hospital course pain management. Subcutaneous Lovenox for DVT prophylaxis. Physical therapy evaluation completed 02/19/2015 with recommendations of physical medicine rehabilitation consult. Patient was admitted for comprehensive rehabilitation program.  Patient transferred to CIR on 02/20/2015 .   Patient currently requires max with mobility secondary to muscle weakness and muscle joint tightness, decreased cardiorespiratoy endurance and unbalanced  muscle activation.  Prior to hospitalization, patient was independent  with mobility and lived with  Daughter in a Mobile home home.  Home access is 4-5Stairs to enter.  Patient will benefit from skilled PT intervention to maximize safe functional mobility, minimize fall risk and decrease caregiver burden for planned discharge home with intermittent assist.  Anticipate patient will benefit from follow up [facility] at discharge.  PT - End of Session Activity Tolerance: Tolerates 30+ min activity with multiple rests Endurance Deficit: Yes Endurance Deficit Description: Pt limited by motor strength of old CVA in R-side. Needed frequent seated rest breaks PT Assessment Rehab Potential (ACUTE/IP ONLY): Good Barriers to Discharge: Inaccessible home environment;Decreased caregiver support PT Patient demonstrates impairments in the following area(s): Balance;Edema;Endurance;Motor;Pain;Safety;Sensory PT Transfers Functional Problem(s): Bed Mobility;Bed to Chair;Car PT Locomotion Functional Problem(s): Ambulation;Wheelchair Mobility;Stairs PT Plan PT Intensity: Minimum of 1-2 x/day ,45 to 90 minutes PT Frequency: 5 out of 7 days PT Duration Estimated Length of Stay: 7-10 days PT Treatment/Interventions: Ambulation/gait training;Balance/vestibular training;Discharge planning;Disease management/prevention;DME/adaptive equipment instruction;Functional mobility training;Neuromuscular re-education;Pain management;Patient/family education;Psychosocial support;Skin care/wound management;Splinting/orthotics;Stair training;Therapeutic Activities;Therapeutic Exercise;UE/LE Strength taining/ROM;Wheelchair propulsion/positioning PT Transfers Anticipated Outcome(s): Mod I  PT Locomotion Anticipated Outcome(s): Mod I short distances PT Recommendation Follow Up Recommendations: Home health PT;24 hour supervision/assistance;Other (comment) (Pt will have only intermittent S) Patient destination: Home Equipment Recommended: To be determined (WC needed? ) Equipment Details: Has RW, sister may have appropriate WC to fit mobile  home, but not elevating leg rest  Skilled Therapeutic Intervention Pt tx initiated upon eval. Pt oriented to unit, call bell, PT POC, and precautions. Pt was educated on fall risk reduction DME. Pt instructed in transfer training for basic squat pivot transfer, sit<>stands to/from RW, supine therex within precautions, and gait with RW. Stairs were attempted but pt unable to perform due to UE fatigue. Pt required up to Max A during basic transfers for lifting and lowering and/or safely completing transitional movement due to LOB. Pt needed occasional safety cues for WNB during transitions, but did well overall. Pt wanted to return to bed at end of tx due to pain and fatigue. RN aware and brought pain meds. See full eval for further details.  Therex included each of the following x20: ankle pumps, LLE heel slides, glute sets, and WC propulsion for UE strengthening.   Second tx focused on ffunctinoal mobility training and therex for strengthening in supine. Pt restin in bed, fatuigued but pain well-managed. Pt declined attempt at stairs backwards this afternoon, but agreeable to bedside tx. . Pt needing to use bathroom.  Bed mobility performed with Min A for supine<>sit. Squat-pivot transfer with up to Mod A for safely completing turn/managing LE. Pt sat unsupported x18mn with LE propped on pillows with distant S.  Pt instructed in General Strengthening Program handout, modified for his precautions and with cues for technique. AAROM for SLR and hip ADD/ABD. Elevated LE upon departure and discussed with RN. Pt with no questions at this time, wanting to stay in bed for comfort.    PT Evaluation Precautions/Restrictions Precautions Precautions: Fall Required Braces or Orthoses: Knee Immobilizer - Right Knee Immobilizer - Right: On at all times Restrictions Weight Bearing Restrictions: Yes RLE Weight Bearing: Non weight bearing General   Vital SignsTherapy Vitals Pulse Rate: 71 BP: (!) 152/83  mmHg Patient Position (if appropriate): Lying Oxygen Therapy SpO2: 97 % O2 Device: Not Delivered Pain Pain Assessment Pain Assessment: 0-10 Pain Score: 6  Pain Type: Acute pain Pain Location: Leg Pain Orientation: Right Pain  Descriptors / Indicators: Aching Pain Onset: On-going Pain Intervention(s): Medication (See eMAR) Home Living/Prior Functioning Home Living Available Help at Discharge: Family;Friend(s) Type of Home: Mobile home Home Access: Stairs to enter Entrance Stairs-Number of Steps: 4-5 Entrance Stairs-Rails: Can reach both Home Layout: One level Bathroom Shower/Tub: Walk-in shower;Door [employer] Toilet: Standard  Lives With: Daughter Prior Function Level of Independence: Requires assistive device for independence  Able to Take Stairs?: Yes Driving: Yes Comments: cane Vision/Perception  Vision - History Baseline Vision: Wears glasses only for reading Patient Visual Report: No change from baseline Vision - Assessment Eye Alignment: Within Functional Limits Perception Perception: Within Functional Limits Praxis Praxis: Intact  Cognition Overall Cognitive Status: Within Functional Limits for tasks assessed Arousal/Alertness: Awake/alert Orientation Level: Oriented X4 Memory: Appears intact Awareness: Appears intact Problem Solving: Appears intact Safety/Judgment: Appears intact Sensation Sensation Light Touch: Impaired Detail Light Touch Impaired Details: Impaired LUE Proprioception: Appears Intact Coordination Gross Motor Movements are Fluid and Coordinated: Yes Motor  Motor Motor: Hemiplegia Motor - Skilled Clinical Observations: Mild R-sided weakness due to h/o CVA, but functional strength noted  Mobility Bed Mobility Bed Mobility: Supine to Sit Supine to Sit: 4: Min assist Supine to Sit Details (indicate cue type and reason): Min A needed for RLE management Transfers Transfers: Yes Squat Pivot Transfers: 2: Max Risk manager  Details (indicate cue type and reason): Lifting assist and safely completing turn due to LOB anteriorly Locomotion  Ambulation Ambulation: Yes Ambulation/Gait Assistance: 3: Mod assist;1: +2 Total assist (WC follow) Ambulation Distance (Feet): 8 Feet Assistive device: Rolling walker Ambulation/Gait Assistance Details: Demo and instruction for NWB technique, limited by fatigue, but good technique Stairs / Additional Locomotion Stairs: No (Attempted, but pt unable) Product manager Mobility: Yes Wheelchair Assistance: 5: Careers information officer: Both upper extremities Wheelchair Parts Management: Needs assistance Distance: 100  Trunk/Postural Assessment  Cervical Assessment Cervical Assessment: Within Functional Limits Thoracic Assessment Thoracic Assessment: Within Functional Limits Lumbar Assessment Lumbar Assessment: Within Functional Limits Postural Control Postural Control: Within Functional Limits  Balance Balance Balance Assessed: Yes Dynamic Sitting Balance Dynamic Sitting - Balance Support: Right upper extremity supported;Left upper extremity supported;During functional activity;Feet unsupported Dynamic Sitting - Level of Assistance: 5: Stand by assistance Static Standing Balance Static Standing - Balance Support: Bilateral upper extremity supported;During functional activity Static Standing - Level of Assistance: 4: Min assist Dynamic Standing Balance Dynamic Standing - Balance Support: Bilateral upper extremity supported;During functional activity Dynamic Standing - Level of Assistance: 3: Mod assist Dynamic Standing - Balance Activities: Lateral lean/weight shifting;Forward lean/weight shifting Extremity Assessment  RUE Assessment RUE Assessment: Exceptions to [facility] (MMT WFL, but motor endurance limits's mobility) LUE Assessment LUE Assessment: Within Functional Limits RLE Assessment RLE Assessment: Exceptions to [facility] RLE PROM (degrees) RLE  Overall PROM Comments: UTA due to KI and precautions. Ankle ROM -10 due to h/o fx as well as new edema, discussed with RN RLE Strength RLE Overall Strength Comments: UTA MMT due to injury, but <3/5, unable to move LE independely wiht bed mobility.  LLE Assessment LLE Assessment: Within Functional Limits  FIM:  FIM - Locomotion: Wheelchair Distance: 100 FIM - Locomotion: Ambulation Ambulation/Gait Assistance: 3: Mod assist;1: +2 Total assist (WC follow)   Refer to Care Plan for Long Term Goals  Recommendations for other services: None  Discharge Criteria: Patient will be discharged from PT if patient refuses treatment 3 consecutive times without medical reason, if treatment goals not met, if there is a change in medical status, if patient  makes no progress towards goals or if patient is discharged from hospital.  The above assessment, treatment plan, treatment alternatives and goals were discussed and mutually agreed upon: by patient  Kennieth Rad, PT, DPT  Kennieth Rad M 02/20/2015, 10:42 AM

## 2015-02-20 NOTE — Progress Notes (Signed)
VASCULAR LAB PRELIMINARY  PRELIMINARY  PRELIMINARY  PRELIMINARY  Bilateral lower extremity venous duplex completed.    Preliminary report:  Right:  DVT noted in the posterior tibial vein . Cannot rule out a DVT of the popliteal vein due to diminished imaging quality  No evidence of superficial thrombosis.  No Baker's cyst. Left:  No evidence of DVT, superficial thrombosis, or Baker's cyst.  Maesyn Frisinger, RVS 02/20/2015, 6:05 PM

## 2015-02-20 NOTE — Progress Notes (Addendum)
Tanner [name] is a 59 y.o. male [birth year] [id number]  Subjective: No new complaints. No new problems. Slept well. Feeling OK. C/o pain in L elbow wound  Objective: Vital signs in last 24 hours: Temp:  [98.1 F (36.7 C)-99.3 F (37.4 C)] 98.5 F (36.9 C) (08/13 0545) Pulse Rate:  [74-81] 74 (08/13 0545) Resp:  [16-18] 18 (08/13 0545) BP: (141-174)/(82-98) 141/82 mmHg (08/13 0840) SpO2:  [95 %-98 %] 95 % (08/13 0545) Weight change:  Last BM Date: 02/19/15  Intake/Output from previous day: 08/12 0701 - 08/13 0700 In: -  Out: 175 [Urine:175] Last cbgs: CBG (last 3)  No results for input(s): GLUCAP in the last 72 hours.   Physical Exam General: No apparent distress   HEENT: not dry Lungs: Normal effort. Lungs clear to auscultation, no crackles or wheezes. Cardiovascular: Regular rate and rhythm, no edema Abdomen: S/NT/ND; BS(+) Musculoskeletal:  Unchanged - R LE cast Neurological: No new neurological deficits Wounds: L elbow abrasion, gauze is stuck   Skin: clear  Aging changes Mental state: Alert, oriented, cooperative RLE w/1+edema    Lab Results: BMET    Component Value Date/Time   NA 135 02/19/2015 0547   K 3.7 02/19/2015 0547   CL 104 02/19/2015 0547   CO2 23 02/19/2015 0547   GLUCOSE 95 02/19/2015 0547   BUN <5* 02/19/2015 0547   CREATININE 0.89 02/19/2015 0547   CALCIUM 7.9* 02/19/2015 0547   GFRNONAA >60 02/19/2015 0547   GFRAA >60 02/19/2015 0547   CBC    Component Value Date/Time   WBC 5.3 02/19/2015 0547   RBC 3.43* 02/19/2015 0547   HGB 11.1* 02/19/2015 0547   HCT 33.6* 02/19/2015 0547   PLT 107* 02/19/2015 0547   MCV 98.0 02/19/2015 0547   MCH 32.4 02/19/2015 0547   MCHC 33.0 02/19/2015 0547   RDW 13.1 02/19/2015 0547   LYMPHSABS 0.9 02/18/2015 0415   MONOABS 0.6 02/18/2015 0415   EOSABS 0.1 02/18/2015 0415   BASOSABS 0.0 02/18/2015 0415    Studies/Results: No results found.  Medications: I have reviewed the patient's current  medications.  Assessment/Plan:  1. Functional deficits secondary to right proximal tibia fracture. Conservative care knee brace at all times nonweightbearing 2. DVT Prophylaxis/Anticoagulation: Lovenox. Monitor for any bleeding episodes. Check vascular 3. Pain Management: MSIR as needed as well as Robaxin 4. Mood/anxiety: Prozac 20 mg daily. Provide emotional support 5. Neuropsych: This patient is capable of making decisions on his own behalf. 6. Skin/Wound Care: L elbow wound - see orders 7. Fluids/Electrolytes/Nutrition: Routine I&O with follow-up chemistries 8. History of CVA February 2015. Continue aspirin therapy 9. Hypertension. Lopressor 50 mg twice a day. Monitor with increased mobility 10. History of prostate cancer. Status post radical prostatectomy 11. Hyperlipidemia. Zocor 12. History of alcohol. Monitor for any withdrawal 13. RLE DVT - - full dose Lovenox started    Length of stay, days: 0  Walker Kehr , MD 02/20/2015, 8:45 AM

## 2015-02-20 NOTE — Progress Notes (Addendum)
Occupational Therapy Assessment and Plan  Patient Details  Name: Tanner [name] MRN: [id number] Date of Birth: [birth year]  OT Diagnosis: muscle weakness (generalized), pain in joint and swelling of limb Rehab Potential: Rehab Potential (ACUTE ONLY): Excellent ELOS: 7 days   Today's Date: 02/20/2015 OT Individual Time:  -   1545- 1700  (75 min)       Problem List:  Patient Active Problem List   Diagnosis Date Noted  . Tibia fracture 02/20/2015  . Tibial fracture 02/18/2015  . Syncope 02/18/2015  . Essential hypertension 02/18/2015  . History of stroke 02/18/2015  . Fracture of tibia, right, closed   . C. difficile colitis 09/11/2013  . UTI (urinary tract infection) 09/11/2013  . Essential hypertension, benign 09/11/2013  . Ileus 09/10/2013  . Spastic hemiplegia affecting dominant side 09/09/2013  . Ileus, postoperative 09/09/2013  . Hypokalemia 09/09/2013  . Nonspecific (abnormal) findings on radiological and other examination of gastrointestinal tract 09/09/2013  . CVA (cerebral infarction) 09/05/2013  . 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 Malignant neoplasm of prostate 08/29/2013  . Prostate cancer 05/22/2013  . Hyponatremia 12/11/2012    Class: Chronic  . Ankle fracture 12/10/2012    Past Medical History:  Past Medical History  Diagnosis Date  . Alcoholic hepatitis   . Hypertension   . 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 Shortness of breath     new onset- occasionally-at rest and exertion  . GERD (gastroesophageal reflux disease)   . Anxiety   . TIA (transient ischemic attack) 08/2013  . Arthritis     " in my spine "  . Ileus 09/2013   Past Surgical History:  Past Surgical History  Procedure Laterality Date  . Hand surgery Right   . Hernia repair    . Orif ankle fracture Right 12/10/2012    Procedure: OPEN REDUCTION INTERNAL FIXATION (ORIF) ANKLE FRACTURE;  Surgeon: Hessie Dibble, MD;  Location: [facility];  Service: Orthopedics;  Laterality:  Right;  . Robot assisted laparoscopic radical prostatectomy N/A 08/29/2013    Procedure: ROBOTIC ASSISTED LAPAROSCOPIC RADICAL PROSTATECTOMY LEVEL 2, Lysis of adhesions;  Surgeon: Molli Hazard, MD;  Location: [facility];  Service: Urology;  Laterality: N/A;  . Lymphadenectomy Bilateral 08/29/2013    Procedure: LYMPHADENECTOMY "BILATERAL PELVIC LYMPH NODE DISSECTION";  Surgeon: Molli Hazard, MD;  Location: [facility];  Service: Urology;  Laterality: Bilateral;    Assessment & Plan Clinical Impression:HPI: Tanner [name] is a 59 y.o. right handed male with history of alcoholism, hypertension, prostate cancer with radical prostatectomy with postop CVA and right sided weakness maintained on aspirin. Patient did receive inpatient rehabilitation services after CVA February 2015. He lives with his family and assistance as needed independent without assistive device prior to admission. Presented 02/18/2015 after a fall trying to open a door with right lower extremity pain. Noted in the ED patient with brief loss of consciousness becoming diaphoretic. EKG normal sinus rhythm. CT of the head showed no acute appearing infarct. X-rays and imaging of right lower extremity showed right proximal tibia fracture. Orthopedic services Dr. Rhona Raider consulted advised no surgical intervention. Placed in a knee brace nonweightbearing for 2 months. Hospital course pain management. Subcutaneous Lovenox for DVT prophylaxis.  Patient transferred to CIR on 02/20/2015 .    Patient currently requires mod with basic self-care skills secondary to muscle weakness and unbalanced muscle activation.  Prior to hospitalization, patient could complete BADL with independent .  Patient will benefit from skilled  intervention to increase independence with basic self-care skills and increase level of independence with iADL prior to discharge home with care partner.  Anticipate patient will require intermittent supervision and follow up home  health.  OT - End of Session Activity Tolerance: Tolerates 10 - 20 min activity with multiple rests Endurance Deficit: Yes Endurance Deficit Description: Pt limited by motor strength of old CVA in R-side. Needed frequent seated rest breaks OT Assessment Rehab Potential (ACUTE ONLY): Excellent Barriers to Discharge: Decreased caregiver support Barriers to Discharge Comments:  (Daughter is in school part of time) OT Patient demonstrates impairments in the following area(s): Balance;Endurance;Motor;Pain;Safety OT Basic ADL's Functional Problem(s): Grooming;Bathing;Dressing;Toileting OT Advanced ADL's Functional Problem(s): Simple Meal Preparation OT Transfers Functional Problem(s): Toilet OT Additional Impairment(s): None OT Plan OT Intensity: Minimum of 1-2 x/day, 45 to 90 minutes OT Frequency: 5 out of 7 days OT Duration/Estimated Length of Stay: 5-7 days OT Treatment/Interventions: Balance/vestibular training;Functional mobility training;Neuromuscular re-education;Patient/family education;Self Care/advanced ADL retraining;Therapeutic Activities OT Self Feeding Anticipated Outcome(s): independent OT Basic Self-Care Anticipated Outcome(s): mod I OT Toileting Anticipated Outcome(s): mod I  OT Bathroom Transfers Anticipated Outcome(s): mod I to [facility] OT Recommendation Patient destination: Home Follow Up Recommendations: Home health OT Equipment Recommended: 3 in 1 bedside comode;Standard walker Equipment Details:  (pt has wc, cane, crutches, 2WW, BSC, shower seat)   Skilled Therapeutic Intervention Pt. lyingin bed. Pain 8/10 .  Performed rolling and sitting up in bed for bathing with min assist. . Pt. Transferred from EOB to recliner with mod assist and then back to bed.  OT explained goals and POC.  Marland Kitchen Left with all needs in reach.     OT Evaluation Precautions/Restrictions  Precautions Required Braces or Orthoses: Knee Immobilizer - Right Knee Immobilizer - Right: On at all  times Restrictions Weight Bearing Restrictions: Yes RLE Weight Bearing: Non weight bearing    Vital Signs  Pain Pain Assessment Pain Assessment: 0-10 Pain Score: 8  Pain Type: Acute pain Pain Location: Leg Pain Orientation: Right Pain Descriptors / Indicators: Aching Pain Onset: On-going Pain Intervention(s): Medication (See eMAR) Home Living/Prior Functioning Home Living Available Help at Discharge: Family, Friend(s) Type of Home: Mobile home Home Access: Stairs to enter Technical brewer of Steps: 4-5 Entrance Stairs-Rails: Can reach both Home Layout: One level Bathroom Shower/Tub: Gaffer, Door [employer] Toilet: Standard Bathroom Accessibility: Yes Additional Comments: 57 yo daug stays with pt and will be with pt when not working or school.  Sister may help  Lives With: Daughter IADL History Homemaking Responsibilities: Yes Meal Prep Responsibility: Primary Laundry Responsibility: Primary Cleaning Responsibility: Primary Bill Paying/Finance Responsibility: Primary Shopping Responsibility: Primary Current License: Yes Mode of Transportation: Car Occupation: On disability Leisure and Hobbies: TV, yard work, sitting on Sales executive Prior Function Level of Independence: Independent with basic ADLs, Independent with gait, Independent with transfers  Able to Take Stairs?: Yes Driving: Yes Vocation: On disability Leisure: Hobbies-yes (Comment) Comments: cane ADL   Vision/Perception  Vision- History Baseline Vision/History: Wears glasses (for reading) Wears Glasses: Reading only Vision- Assessment Eye Alignment: Within Functional Limits Perception Perception: Within Functional Limits  Cognition Overall Cognitive Status: Within Functional Limits for tasks assessed Arousal/Alertness: Awake/alert Orientation Level: Place;Person;Situation Person: Oriented Place: Oriented Situation: Oriented Year: 2016 Month: August Day of Week: Correct Immediate  Memory Recall: Bed Memory Recall: Sock;Blue;Bed Attention: Selective Selective Attention: Appears intact Awareness: Appears intact Problem Solving: Appears intact Safety/Judgment: Appears intact Sensation Sensation Light Touch: Impaired Detail Light Touch Impaired Details: Impaired RLE Music therapist  Movements are Fluid and Coordinated: Yes Fine Motor Movements are Fluid and Coordinated: Yes Motor  Motor Motor: Hemiplegia Motor - Skilled Clinical Observations: Mild R-sided weakness due to h/o CVA, but functional strength noted Mobility  Bed Mobility Bed Mobility: Supine to Sit Supine to Sit: 4: Min assist  Trunk/Postural Assessment  Cervical Assessment Cervical Assessment: Within Functional Limits Thoracic Assessment Thoracic Assessment: Within Functional Limits Lumbar Assessment Lumbar Assessment: Within Functional Limits Postural Control Postural Control: Within Functional Limits  Balance Balance Balance Assessed: Yes Dynamic Sitting Balance Dynamic Sitting - Balance Support: Right upper extremity supported;Left upper extremity supported;During functional activity;Feet unsupported Dynamic Sitting - Level of Assistance: 5: Stand by assistance Static Standing Balance Static Standing - Balance Support: Bilateral upper extremity supported;During functional activity Static Standing - Level of Assistance: 4: Min assist Dynamic Standing Balance Dynamic Standing - Balance Support: Bilateral upper extremity supported;During functional activity Dynamic Standing - Level of Assistance: 3: Mod assist Dynamic Standing - Balance Activities: Lateral lean/weight shifting;Forward lean/weight shifting Extremity/Trunk Assessment RUE Assessment RUE Assessment: Exceptions to [facility] RUE AROM (degrees) Overall AROM Right Upper Extremity: Deficits LUE Assessment LUE Assessment: Within Functional Limits  FIM:  FIM - Eating Eating Activity: 7: Complete independence:no helper FIM  - Bed/Chair Transfer Bed/Chair Transfer Assistive Devices: Arm rests;Bed rails Bed/Chair Transfer: 4: Supine > Sit: Min A (steadying Pt. > 75%/lift 1 leg);4: Sit > Supine: Min A (steadying pt. > 75%/lift 1 leg);3: Bed > Chair or W/C: Mod A (lift or lower assist);3: Chair or W/C > Bed: Mod A (lift or lower assist) FIM - Radio producer Devices: Bedside commode Toilet Transfers: 3-To toilet/BSC: Mod A (lift or lower assist);3-From toilet/BSC: Mod A (lift or lower assist)   Refer to Care Plan for Long Term Goals  Recommendations for other services: None  Discharge Criteria: Patient will be discharged from OT if patient refuses treatment 3 consecutive times without medical reason, if treatment goals not met, if there is a change in medical status, if patient makes no progress towards goals or if patient is discharged from hospital.  The above assessment, treatment plan, treatment alternatives and goals were discussed and mutually agreed upon: by patient  Lisa Roca 02/20/2015, 5:02 PM

## 2015-02-21 ENCOUNTER — Inpatient Hospital Stay (HOSPITAL_COMMUNITY): Payer: Medicare Other | Admitting: Occupational Therapy

## 2015-02-21 DIAGNOSIS — I82401 Acute embolism and thrombosis of unspecified deep veins of right lower extremity: Secondary | ICD-10-CM

## 2015-02-21 NOTE — Progress Notes (Signed)
Occupational Therapy Session Note  Patient Details  Name: Tanner [name] MRN: [id number] Date of Birth: 1956/02/05  Today's Date: 02/21/2015 OT Individual Time:  -  0900-1000  (60 min)       Week 1   STG=LTG :     Skilled Therapeutic Interventions/Progress Updates:    Pt. Lying in bed.  OT addressed bed mobility, toilet transfers.  Pt reported he had been nauseated.  Went from supine to sit on right side of bed with mod assist.  Transferred from EOB to wc to 3n1 over toilet with min (lateral scoot x1 and stand pivot x2)  assist.  Pt. Sat on standard toilet with using grab bars for support.  Pt had BM.  Pt. Leaned to one side for cleaning self with min assist.Tansferred back to wc.  Bathed at sink.  Performed sit to stand with min assist with total assist for peri care.  Pt. Returned to bed with min assist with transfers.  Pt left in bed with all needs in reach.    Therapy Documentation Precautions:  Precautions Precautions: Fall Required Braces or Orthoses: Knee Immobilizer - Right Knee Immobilizer - Right: On at all times Restrictions Weight Bearing Restrictions: Yes RLE Weight Bearing: Non weight bearing   Pain:  10/10 back ; right groin            See FIM for current functional status  Therapy/Group: Individual Therapy  Lisa Roca 02/21/2015, 10:25 AM

## 2015-02-21 NOTE — Progress Notes (Signed)
Lucan Riner is a 59 y.o. male 04-16-1956 [id number]  Subjective: No new complaints. Slept well. Feeling OK. Pain in L elbow wound - better  Objective: Vital signs in last 24 hours: Temp:  [97.8 F (36.6 C)-98.4 F (36.9 C)] 97.8 F (36.6 C) (08/14 0547) Pulse Rate:  [68-71] 71 (08/14 0547) Resp:  [18-19] 19 (08/14 0547) BP: (117-169)/(73-96) 169/96 mmHg (08/14 0547) SpO2:  [96 %-98 %] 98 % (08/14 0547) Weight:  [179 lb 3.2 oz (81.285 kg)] 179 lb 3.2 oz (81.285 kg) (08/13 1843) Weight change:  Last BM Date: 02/18/15  Intake/Output from previous day: 08/13 0701 - 08/14 0700 In: 370 [P.O.:370] Out: 1150 [Urine:1150] Last cbgs: CBG (last 3)  No results for input(s): GLUCAP in the last 72 hours.   Physical Exam General: No apparent distress   HEENT: not dry Lungs: Normal effort. Lungs clear to auscultation, no crackles or wheezes. Cardiovascular: Regular rate and rhythm, no edema Abdomen: S/NT/ND; BS(+) Musculoskeletal:  Unchanged - R LE cast Neurological: No new neurological deficits Wounds: L elbow abrasion, gauze is stuck   Skin: clear  Aging changes Mental state: Alert, oriented, cooperative RLE w/1+edema, sensitive    Lab Results: BMET    Component Value Date/Time   NA 135 02/19/2015 0547   K 3.7 02/19/2015 0547   CL 104 02/19/2015 0547   CO2 23 02/19/2015 0547   GLUCOSE 95 02/19/2015 0547   BUN <5* 02/19/2015 0547   CREATININE 0.89 02/19/2015 0547   CALCIUM 7.9* 02/19/2015 0547   GFRNONAA >60 02/19/2015 0547   GFRAA >60 02/19/2015 0547   CBC    Component Value Date/Time   WBC 5.3 02/19/2015 0547   RBC 3.43* 02/19/2015 0547   HGB 11.1* 02/19/2015 0547   HCT 33.6* 02/19/2015 0547   PLT 107* 02/19/2015 0547   MCV 98.0 02/19/2015 0547   MCH 32.4 02/19/2015 0547   MCHC 33.0 02/19/2015 0547   RDW 13.1 02/19/2015 0547   LYMPHSABS 0.9 02/18/2015 0415   MONOABS 0.6 02/18/2015 0415   EOSABS 0.1 02/18/2015 0415   BASOSABS 0.0 02/18/2015 0415     Studies/Results: No results found.  Medications: I have reviewed the patient's current medications.  Assessment/Plan:  1. Functional deficits secondary to right proximal tibia fracture. Conservative care knee brace at all times nonweightbearing 2. DVT Prophylaxis/Anticoagulation: Lovenox. Monitor for any bleeding episodes. Check vascular 3. Pain Management: MSIR as needed as well as Robaxin 4. Mood/anxiety: Prozac 20 mg daily. Provide emotional support 5. Neuropsych: This patient is capable of making decisions on his own behalf. 6. Skin/Wound Care: L elbow wound - see orders 7. Fluids/Electrolytes/Nutrition: Routine I&O with follow-up chemistries 8. History of CVA February 2015. Continue aspirin therapy 9. Hypertension. Lopressor 50 mg twice a day. Monitor with increased mobility 10. History of prostate cancer. Status post radical prostatectomy 11. Hyperlipidemia. Zocor 12. History of alcohol. Monitor for any withdrawal 13. RLE DVT - - full dose Lovenox started on 8/13    Length of stay, days: 1  Walker Kehr , MD 02/21/2015, 7:50 AM

## 2015-02-22 ENCOUNTER — Inpatient Hospital Stay (HOSPITAL_COMMUNITY): Payer: Medicare Other | Admitting: Physical Therapy

## 2015-02-22 ENCOUNTER — Inpatient Hospital Stay (HOSPITAL_COMMUNITY): Payer: Medicare Other

## 2015-02-22 DIAGNOSIS — F10231 Alcohol dependence with withdrawal delirium: Secondary | ICD-10-CM

## 2015-02-22 LAB — CBC
HEMATOCRIT: 31.3 % — AB (ref 39.0–52.0)
Hemoglobin: 10.5 g/dL — ABNORMAL LOW (ref 13.0–17.0)
MCH: 32.6 pg (ref 26.0–34.0)
MCHC: 33.5 g/dL (ref 30.0–36.0)
MCV: 97.2 fL (ref 78.0–100.0)
Platelets: 150 10*3/uL (ref 150–400)
RBC: 3.22 MIL/uL — AB (ref 4.22–5.81)
RDW: 12.6 % (ref 11.5–15.5)
WBC: 6 10*3/uL (ref 4.0–10.5)

## 2015-02-22 MED ORDER — CHLORDIAZEPOXIDE HCL 25 MG PO CAPS
25.0000 mg | ORAL_CAPSULE | Freq: Three times a day (TID) | ORAL | Status: DC
  Administered 2015-02-22 – 2015-02-24 (×9): 25 mg via ORAL
  Filled 2015-02-22 (×9): qty 1

## 2015-02-22 NOTE — Progress Notes (Signed)
Retta Diones, RN Rehab Admission Coordinator Signed Physical Medicine and Rehabilitation PMR Pre-admission 02/19/2015 4:13 PM  Related encounter: ED to Hosp-Admission (Discharged) from 02/17/2015 in [facility] Collapse All   PMR Admission Coordinator Pre-Admission Assessment  Patient: Tanner [name] is an 59 y.o., male MRN: [id number] DOB: 12/28/1955 Height:   Weight:    Insurance Information HMO:No PPO: PCP: IPA: 80/20: OTHER:  PRIMARY: Medicare A/B Policy#: [id number] Subscriber: Gabriel Earing CM Name: Phone#: Fax#:  Pre-Cert#: Employer: Disabled Benefits: Phone #: Name: Checked in [place]. Date: 01/07/14 Deduct: $1288 Out of Pocket Max: none Life Max: unlimited CIR: 100% SNF: 100 days Outpatient: 80% Co-Pay: 20% Home Health: 100% Co-Pay: none DME: 80% Co-Pay: 20% Providers: patient's choice  Emergency Contact Information Contact Information    Name Relation Home Work [place] Daughter [phone]     Allred,Betty Sister [phone]  [phone]   Peoples,Tempie Sister [phone]     Sandie Ano   [phone]     Current Medical History  Patient Admitting Diagnosis: R tibia fracture, old CVA  History of Present Illness: A 59 y.o. right handed male with history of alcoholism, hypertension, prostate cancer with radical prostatectomy with postop CVA and right sided weakness maintained on aspirin. Patient did receive inpatient rehabilitation services after CVA February 2015. He lives with his family and assistance as needed independent without assistive device prior to admission. Presented 02/18/2015 after a fall trying to  open a door with right lower extremity pain. Noted in the ED patient with brief loss of consciousness becoming diaphoretic. EKG normal sinus rhythm. CT of the head showed no acute appearing infarct. X-rays and imaging of right lower extremity showed right proximal tibia fracture. Orthopedic services Dr. Rhona Raider consulted advised no surgical intervention. Placed in a knee brace nonweightbearing for 2 months. Hospital course pain management. Subcutaneous Lovenox for DVT prophylaxis. Physical therapy evaluation completed 02/19/2015 with recommendations of physical medicine rehabilitation consult.  Patient has been living with his daughter but has not required physical assistance from her. She goes to college but lives in the same home. She'll be starting college next week but will be able to assist some days more than others    Past Medical History  Past Medical History  Diagnosis Date  . Alcoholic hepatitis   . Hypertension   . 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 Shortness of breath     new onset- occasionally-at rest and exertion  . GERD (gastroesophageal reflux disease)   . Anxiety   . TIA (transient ischemic attack) 08/2013  . Arthritis     " in my spine "  . Ileus 09/2013    Family History  family history includes Lung cancer in his father; Pancreatic cancer in his sister.  Prior Rehab/Hospitalizations:  Has the patient had major surgery during 100 days prior to admission? No  Current Medications   Current facility-administered medications:  . acetaminophen (TYLENOL) tablet 650 mg, 650 mg, Oral, Q6H PRN, 650 mg at 02/19/15 1542 **OR** acetaminophen (TYLENOL) suppository 650 mg, 650 mg, Rectal, Q6H PRN, Rise Patience, MD . acetaminophen-codeine (TYLENOL #3) 300-30 MG per tablet 1-2 tablet, 1-2 tablet, Oral, Q4H PRN, Eugenie Filler, MD, 2 tablet at 02/18/15 1719 . aspirin EC tablet 325 mg, 325 mg, Oral, Daily, Rise Patience, MD, 325 mg at 02/19/15 1038 . enoxaparin (LOVENOX) injection 40 mg, 40 mg, Subcutaneous, Q24H, Rise Patience, MD, 40  mg at 02/19/15 1039 . FLUoxetine (PROZAC) capsule 20 mg, 20 mg, Oral, Daily, Rise Patience, MD, 20 mg at 02/19/15 1038 . folic acid (FOLVITE) tablet 1 mg, 1 mg, Oral, Daily, Rise Patience, MD, 1 mg at 02/19/15 1038 . lisinopril (PRINIVIL,ZESTRIL) tablet 40 mg, 40 mg, Oral, Daily, Rise Patience, MD, 40 mg at 02/19/15 1038 . LORazepam (ATIVAN) tablet 1 mg, 1 mg, Oral, Q6H PRN **OR** LORazepam (ATIVAN) injection 1 mg, 1 mg, Intravenous, Q6H PRN, Rise Patience, MD . LORazepam (ATIVAN) tablet 0-4 mg, 0-4 mg, Oral, Q6H, 0 mg at 02/18/15 0532 **FOLLOWED BY** [START ON 02/20/2015] LORazepam (ATIVAN) tablet 0-4 mg, 0-4 mg, Oral, Q12H, Rise Patience, MD . metoprolol (LOPRESSOR) tablet 50 mg, 50 mg, Oral, BID, Rise Patience, MD, 50 mg at 02/19/15 1038 . morphine (MSIR) tablet 15-30 mg, 15-30 mg, Oral, Q4H PRN, Eugenie Filler, MD, 15 mg at 02/19/15 1318 . morphine 4 MG/ML injection 4 mg, 4 mg, Intravenous, Q3H PRN, Irine Seal V, MD, 4 mg at 02/19/15 1037 . multivitamin with minerals tablet 1 tablet, 1 tablet, Oral, Daily, Rise Patience, MD, 1 tablet at 02/19/15 1038 . pantoprazole (PROTONIX) EC tablet 40 mg, 40 mg, Oral, Daily, Rise Patience, MD, 40 mg at 02/19/15 1038 . simvastatin (ZOCOR) tablet 20 mg, 20 mg, Oral, q1800, Rise Patience, MD, 20 mg at 02/18/15 1719 . thiamine (VITAMIN B-1) tablet 100 mg, 100 mg, Oral, Daily, 100 mg at 02/19/15 1038 **OR** [DISCONTINUED] thiamine (B-1) injection 100 mg, 100 mg, Intravenous, Daily, Rise Patience, MD . traZODone (DESYREL) tablet 50 mg, 50 mg, Oral, QHS PRN, Rise Patience, MD  Patients Current Diet: Diet Heart Room service appropriate?: Yes; Fluid consistency:: Thin  Precautions / Restrictions Precautions Precautions: Fall Restrictions Weight  Bearing Restrictions: Yes RLE Weight Bearing: Non weight bearing   Has the patient had 2 or more falls or a fall with injury in the past year?No  Prior Activity Level Community (5-7x/wk): Went out daily. Disabled. Not working. Was driving  Development worker, international aid / [place] Devices/Equipment: Environmental consultant (specify type), Crutches, Cane (specify quad or straight) Home Equipment: Walker - 2 wheels, Cane - quad, Crutches, Bedside commode, Shower seat  Prior Device Use: Indicate devices/aids used by the patient prior to current illness, exacerbation or injury? None of the above. Used a cane after rehab last year, but no device most recently.  Prior Functional Level Prior Function Level of Independence: Independent (slow gait) Comments: cane  Self Care: Did the patient need help bathing, dressing, using the toilet or eating? Independent  Indoor Mobility: Did the patient need assistance with walking from room to room (with or without device)? Independent  Stairs: Did the patient need assistance with internal or external stairs (with or without device)? Independent  Functional Cognition: Did the patient need help planning regular tasks such as shopping or remembering to take medications? Independent  Current Functional Level Cognition  Overall Cognitive Status: Within Functional Limits for tasks assessed   Extremity Assessment (includes Sensation/Coordination)  Upper Extremity Assessment: Overall WFL for tasks assessed  Lower Extremity Assessment: Defer to PT evaluation RLE Deficits / Details: limited strength, reports due to pain    ADLs  Overall ADL's : Needs assistance/impaired Eating/Feeding: Set up, Bed level Grooming: Set up, Bed level Upper Body Bathing: Minimal assitance, Sitting Lower Body Bathing: Total assistance, Sit to/from stand, Cueing for safety Upper Body Dressing : Minimal assistance, Sitting Lower Body Dressing: Sit to/from stand,  Total  assistance, Cueing for safety Toilet Transfer Details (indicate cue type and reason): Did not occur secondary to increased fatigue and dizziness in sitting and standing positions General ADL Comments: Pt limited secondary to pain, dizziness upon sitting/standing, and increased fatigue. Pt required heavy min assist to sit EOB, therapist providing 100% assistance with RLE (pt unable). Pt stood EOB with mod assist using RW, cues for hand placement and technique to adhere to NWB>RLE. Pt unsafe to d/c home alone. Will benefit from CIR.     Mobility  Overal bed mobility: Needs Assistance Bed Mobility: Supine to Sit, Sit to Supine Supine to sit: Min assist Sit to supine: Min assist General bed mobility comments: Heavy use of rails and A to manage R LE    Transfers  Overall transfer level: Needs assistance Equipment used: Rolling walker (2 wheeled) Transfers: Stand Pivot Transfers Sit to Stand: Mod assist, +2 safety/equipment Stand pivot transfers: +2 physical assistance, Min assist General transfer comment: Patient stated he was initially dizzy with sitting EOB but subsided and agreeable to transfer to recliner. +2 assist to SPT for safety and to ensure R LE NWB and balance. Patient able to take some pivotal hops to reclienr    Ambulation / Gait / Stairs / Emergency planning/management officer  Ambulation/Gait General Gait Details: unable to attempt    Posture / Balance Balance Overall balance assessment: Needs assistance Sitting-balance support: No upper extremity supported, Single extremity supported Sitting balance-Leahy Scale: Fair Standing balance support: Bilateral upper extremity supported, During functional activity Standing balance-Leahy Scale: Poor    Special needs/care consideration BiPAP/CPAP No CPM No Continuous Drip IV No Dialysis No  Life Vest No Oxygen No Special Bed No Trach Size No Wound Vac (area) No  Skin Has large skin tear on left elbow with dressing.  Has a knee brace on right leg.  Bowel mgmt: Last BM 02/17/15 PTA per patient. Bladder mgmt: Voiding in urinal. Some incontinence is documented. Diabetic mgmt No    Previous Home Environment Living Arrangements: Children, Other relatives Available Help at Discharge: Family, Friend(s) Type of Home: Mobile home Home Layout: One level Home Access: Stairs to enter Entrance Stairs-Rails: Right Entrance Stairs-Number of Steps: 4-5 Bathroom Shower/Tub: Gaffer, Charity fundraiser: March ARB: No  Discharge Living Setting Plans for Discharge Living Setting: Patient's home, Lives with (comment), Mobile Home (Lives with 41 yo boyfriend.) Type of Home at Discharge: Mobile home Discharge Home Layout: One level Discharge Home Access: Stairs to enter Entrance Stairs-Number of Steps: 5 step entry Does the patient have any problems obtaining your medications?: No  Social/Family/Support Systems Patient Roles: Parent (Has a 50 yo daughter.) Contact Information: Kannon Granderson - daughter [phone] Anticipated Caregiver: daughter and sisters Ability/Limitations of Caregiver: Daughter works and is returning to school soon Caregiver Availability: Intermittent Discharge Plan Discussed with Primary Caregiver: Yes Is Caregiver In Agreement with Plan?: Yes Does Caregiver/Family have Issues with Lodging/Transportation while Pt is in Rehab?: No  Goals/Additional Needs Patient/Family Goal for Rehab: PT/OT mod I and supervision goals Expected length of stay: 5-7 days Cultural Considerations: None Dietary Needs: Heart diet, thin liquids Equipment Needs: TBD Pt/Family Agrees to Admission and willing to participate: Yes Program Orientation Provided & Reviewed with Pt/Caregiver Including Roles & Responsibilities: Yes  Decrease burden of Care through IP rehab admission: N/A  Possible need for SNF placement upon discharge: Not planned  Patient Condition: This  patient's condition remains as documented in the consult dated 02/19/15, in which the Rehabilitation Physician determined  and documented that the patient's condition is appropriate for intensive rehabilitative care in an inpatient rehabilitation facility. Will admit to inpatient rehab today.  Preadmission Screen Completed By: Retta Diones, 02/19/2015 4:29 PM ______________________________________________________________________  Discussed status with Dr. Letta Pate on 02/19/15 at 1627 and received telephone approval for admission today.  Admission Coordinator: Retta Diones, time1629/Date08/12/16          Cosigned by: [name], MD at 02/19/2015 4:35 PM  Revision History     Date/Time User Provider Type Action   02/19/2015 4:35 PM [name], MD Physician Cosign   02/19/2015 4:29 PM Retta Diones, RN Rehab Admission Coordinator Sign

## 2015-02-22 NOTE — Progress Notes (Signed)
[name], MD Physician Signed Physical Medicine and Rehabilitation Consult Note 02/19/2015 10:39 AM  Related encounter: ED to Hosp-Admission (Discharged) from 02/17/2015 in [facility] Collapse All        Physical Medicine and Rehabilitation Consult Reason for Consult: Right proximal tibia fracture/multiple medical Referring Physician: [facility]   HPI: Tanner [name] is a 59 y.o. right handed male with history of alcoholism, hypertension, prostate cancer with radical prostatectomy with postop CVA and right sided weakness maintained on aspirin. Patient did receive inpatient rehabilitation services after CVA February 2015. He lives with his family and assistance as needed independent without assistive device prior to admission. Presented 02/18/2015 after a fall trying to open a door with right lower extremity pain. Noted in the ED patient with brief loss of consciousness becoming diaphoretic. EKG normal sinus rhythm. CT of the head showed no acute appearing infarct. X-rays and imaging of right lower extremity showed right proximal tibia fracture. Orthopedic services Dr. Rhona Raider consulted advised no surgical intervention. Placed in a knee brace nonweightbearing for 2 months. Hospital course pain management. Subcutaneous Lovenox for DVT prophylaxis. Physical therapy evaluation completed 02/19/2015 with recommendations of physical medicine rehabilitation consult.  Patient has been living with his daughter but has not required physical assistance from her. She goes to college but lives in the same home. She'll be starting college next week but will be able to assist some days more than others  Review of Systems  Constitutional: Negative for fever and chills.  Eyes: Negative for blurred vision.  Respiratory: Positive for cough.   Shortness of breath with exertion.  Cardiovascular: Negative for chest pain and palpitations.  Gastrointestinal:  Positive for nausea and constipation.   GERD  Genitourinary: Positive for urgency.  Musculoskeletal: Positive for myalgias and falls.  Skin: Negative for rash.  Neurological: Positive for dizziness and weakness. Negative for headaches.  Psychiatric/Behavioral:   Anxiety   Past Medical History  Diagnosis Date  . Alcoholic hepatitis   . Hypertension   . 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 Shortness of breath     new onset- occasionally-at rest and exertion  . GERD (gastroesophageal reflux disease)   . Anxiety   . TIA (transient ischemic attack) 08/2013  . Arthritis     " in my spine "  . Ileus 09/2013   Past Surgical History  Procedure Laterality Date  . Hand surgery Right   . Hernia repair    . Orif ankle fracture Right 12/10/2012    Procedure: OPEN REDUCTION INTERNAL FIXATION (ORIF) ANKLE FRACTURE; Surgeon: Hessie Dibble, MD; Location: [facility]; Service: Orthopedics; Laterality: Right;  . Robot assisted laparoscopic radical prostatectomy N/A 08/29/2013    Procedure: ROBOTIC ASSISTED LAPAROSCOPIC RADICAL PROSTATECTOMY LEVEL 2, Lysis of adhesions; Surgeon: Molli Hazard, MD; Location: [facility]; Service: Urology; Laterality: N/A;  . Lymphadenectomy Bilateral 08/29/2013    Procedure: LYMPHADENECTOMY "BILATERAL PELVIC LYMPH NODE DISSECTION"; Surgeon: Molli Hazard, MD; Location: [facility]; Service: Urology; Laterality: Bilateral;   Family History  Problem Relation Age of Onset  . Pancreatic cancer Sister   . Lung cancer Father    Social History:  reports that he quit smoking about 2 years ago. He has never used smokeless tobacco. He reports that he drinks alcohol. He reports that he uses illicit drugs. Allergies:  Allergies  Allergen Reactions  . Dilaudid [Hydromorphone Hcl] Nausea And Vomiting    Pre pt history  .  Oxycodone Nausea And Vomiting   . Procaine Hcl Other (See Comments)    Patient states he is allergic to novicaine  . Zofran [Ondansetron] Nausea And Vomiting  . Sulfa Antibiotics Rash   Medications Prior to Admission  Medication Sig Dispense Refill  . aspirin 81 MG EC tablet Take 4 tablets (325 mg total) by mouth daily. 30 tablet 0  . Aspirin-Acetaminophen-Caffeine (GOODYS EXTRA STRENGTH) 260-130-16 MG TABS Take 1 Package by mouth daily as needed (for pain).    . clonazePAM (KLONOPIN) 0.5 MG tablet Take 1 tablet (0.5 mg total) by mouth 3 (three) times daily as needed for anxiety. 90 tablet 0  . FLUoxetine (PROZAC) 20 MG capsule Take 1 capsule (20 mg total) by mouth daily. 30 capsule 3  . lisinopril (PRINIVIL,ZESTRIL) 40 MG tablet Take 40 mg by mouth daily.    . metoprolol (LOPRESSOR) 50 MG tablet Take 50 mg by mouth 2 (two) times daily.    Marland Kitchen omeprazole (PRILOSEC) 40 MG capsule Take 40 mg by mouth 2 (two) times daily.     . simvastatin (ZOCOR) 20 MG tablet Take 1 tablet (20 mg total) by mouth daily at 6 PM. 30 tablet 1  . ciprofloxacin (CIPRO) 500 MG tablet Take 1 tablet (500 mg total) by mouth 2 (two) times daily. (Patient not taking: Reported on 02/18/2015) 12 tablet 0  . HYDROcodone-acetaminophen (NORCO/VICODIN) 5-325 MG per tablet Take 1 tablet by mouth every 6 (six) hours as needed for moderate pain. (Patient not taking: Reported on 02/18/2015) 90 tablet 0  . hyoscyamine (LEVSIN, ANASPAZ) 0.125 MG tablet Take 1 tablet (0.125 mg total) by mouth every 4 (four) hours as needed (bladder spasms). (Patient not taking: Reported on 02/18/2015) 40 tablet 4  . lisinopril (PRINIVIL,ZESTRIL) 10 MG tablet Take 1 tablet (10 mg total) by mouth every morning. (Patient not taking: Reported on 02/18/2015) 30 tablet 1  . metoprolol (LOPRESSOR) 25 MG tablet Take 1 tablet (25 mg total) by mouth 2 (two) times daily. (Patient not taking: Reported on 02/18/2015) 60 tablet  1  . morphine (MSIR) 15 MG tablet Take 1 tablet (15 mg total) by mouth every 4 (four) hours as needed for moderate pain or severe pain. (Patient not taking: Reported on 02/18/2015) 60 tablet 0  . traZODone (DESYREL) 50 MG tablet Take 1 tablet (50 mg total) by mouth at bedtime as needed for sleep. 30 tablet 1  . vancomycin (VANCOCIN) 50 mg/mL oral solution Take 5 mLs (250 mg total) by mouth every 6 (six) hours. Stop date March 27th, 2015 (Patient not taking: Reported on 02/18/2015) 100 mL 0    Home: Home Living Family/patient expects to be discharged to:: Private residence Living Arrangements: Children, Other relatives Available Help at Discharge: Family, Friend(s) Type of Home: Mobile home Home Access: Stairs to enter Technical brewer of Steps: 4-5 Entrance Stairs-Rails: Right Home Layout: One level Bathroom Shower/Tub: Gaffer, Charity fundraiser: Standard Home Equipment: Environmental consultant - 2 wheels, [employer] - quad, Crutches, Bedside commode, Shower seat  Functional History: Prior Function Level of Independence: Independent (slow gait) Comments: cane Functional Status:  Mobility: Bed Mobility Overal bed mobility: Needs Assistance Bed Mobility: Supine to Sit, Sit to Supine Supine to sit: Min assist (heavy min assist, therapist providing 100% assist > RLE) Sit to supine: Min assist General bed mobility comments: Pt unable to manage RLE. Pt limited by dizziness, pain, and fatigue Transfers Overall transfer level: Needs assistance Equipment used: Rolling walker (2 wheeled) Transfers: Sit to/from Stand Sit to Stand:  Mod assist General transfer comment: Mod assist to lift and lower from EOB. Pt continues to complain of dizziness upon transitional movements. Pt required mod verbal cueing for hand placement and technique to adhere to NWB>RLE.       ADL: ADL Overall ADL's : Needs assistance/impaired Eating/Feeding: Set up, Bed level Grooming: Set up, Bed  level Upper Body Bathing: Minimal assitance, Sitting Lower Body Bathing: Total assistance, Sit to/from stand, Cueing for safety Upper Body Dressing : Minimal assistance, Sitting Lower Body Dressing: Sit to/from stand, Total assistance, Cueing for safety Toilet Transfer Details (indicate cue type and reason): Did not occur secondary to increased fatigue and dizziness in sitting and standing positions General ADL Comments: Pt limited secondary to pain, dizziness upon sitting/standing, and increased fatigue. Pt required heavy min assist to sit EOB, therapist providing 100% assistance with RLE (pt unable). Pt stood EOB with mod assist using RW, cues for hand placement and technique to adhere to NWB>RLE. Pt unsafe to d/c home alone. Will benefit from CIR.   Cognition: Cognition Overall Cognitive Status: Within Functional Limits for tasks assessed Cognition Arousal/Alertness: Awake/alert Behavior During Therapy: WFL for tasks assessed/performed Overall Cognitive Status: Within Functional Limits for tasks assessed  Blood pressure 163/90, pulse 80, temperature 98.9 F (37.2 C), temperature source Oral, resp. rate 20, SpO2 99 %. Physical Exam  Constitutional: He is oriented to person, place, and time.  HENT:  Head: Normocephalic.  Eyes: EOM are normal.  Neck: Normal range of motion. Neck supple. No thyromegaly present.  Cardiovascular: Normal rate and regular rhythm.  Respiratory: Effort normal and breath sounds normal. No respiratory distress.  GI: Soft. Bowel sounds are normal. He exhibits no distension.  Neurological: He is alert and oriented to person, place, and time.  Skin: Skin is warm and dry.  Motor strength is 4+/5 bilateral deltoid, biceps, triceps, grip 2 minus in the right hip flexor inhibited by pain 2 minus at the ankle dorsiflexor and plantar flexor and hit by pain he has a knee immobilizer on Left lower extremity is 4/5 strength in hip flexor and extensor ankle displaced  plantar flexor Sensation intact to light touch bilateral hands and feet   Lab Results Last 24 Hours    Results for orders placed or performed during the hospital encounter of 02/17/15 (from the past 24 hour(s))  Troponin I (q 6hr x 3) Status: None   Collection Time: 02/18/15 4:15 PM  Result Value Ref Range   Troponin I <0.03 <0.031 ng/mL  CBC Status: Abnormal   Collection Time: 02/19/15 5:47 AM  Result Value Ref Range   WBC 5.3 4.0 - 10.5 K/uL   RBC 3.43 (L) 4.22 - 5.81 MIL/uL   Hemoglobin 11.1 (L) 13.0 - 17.0 g/dL   HCT 33.6 (L) 39.0 - 52.0 %   MCV 98.0 78.0 - 100.0 fL   MCH 32.4 26.0 - 34.0 pg   MCHC 33.0 30.0 - 36.0 g/dL   RDW 13.1 11.5 - 15.5 %   Platelets 107 (L) 150 - 400 K/uL  Basic metabolic panel Status: Abnormal   Collection Time: 02/19/15 5:47 AM  Result Value Ref Range   Sodium 135 135 - 145 mmol/L   Potassium 3.7 3.5 - 5.1 mmol/L   Chloride 104 101 - 111 mmol/L   CO2 23 22 - 32 mmol/L   Glucose, Bld 95 65 - 99 mg/dL   BUN <5 (L) 6 - 20 mg/dL   Creatinine, Ser 0.89 0.61 - 1.24 mg/dL   Calcium 7.9 (  L) 8.9 - 10.3 mg/dL   GFR calc non Af Amer >60 >60 mL/min   GFR calc Af Amer >60 >60 mL/min   Anion gap 8 5 - 15      Imaging Results (Last 48 hours)    Dg Tibia/fibula Right  02/17/2015 CLINICAL DATA: Further evaluation of known tibial fracture. Initial encounter. EXAM: RIGHT TIBIA AND FIBULA - 2 VIEW COMPARISON: Right tibia/fibula radiographs performed 12/09/2012 FINDINGS: As noted on recent prior studies, there is a comminuted oblique slightly displaced fracture extending across the proximal tibial metadiaphysis. An associated moderate knee joint effusion is noted. There is also a minimally displaced fracture involving the proximal fibula. Distal fibular hardware appears grossly intact, without evidence of loosening. Distal tibial screws are also  grossly unremarkable. Lateral soft tissue swelling is noted at the ankle. The ankle mortise is grossly unremarkable. IMPRESSION: 1. Comminuted oblique slightly displaced fracture extending across the proximal tibial metadiaphysis, with associated moderate knee joint effusion. 2. Minimally displaced fracture involving the proximal fibula. Electronically Signed By: Garald Balding M.D. On: 02/17/2015 23:24   Ct Head Wo Contrast  02/18/2015 CLINICAL DATA: Syncopal episode earlier today. Similar episode 2 weeks prior EXAM: CT HEAD WITHOUT CONTRAST TECHNIQUE: Contiguous axial images were obtained from the base of the skull through the vertex without intravenous contrast. COMPARISON: Head CT September 06, 2013. Brain MRI September 02, 2013 FINDINGS: Mild generalized atrophy is stable. There is no demonstrable mass, hemorrhage, extra-axial fluid collection, or midline shift. There is decreased attenuation in the inferior left centrum semiovale with extension inferiorly to the posterior aspect of the posterior limb of the left internal capsule consistent with prior infarct. Elsewhere, there is minimal periventricular small vessel disease. No acute infarct appreciable. : Bony calvarium appears intact. The mastoid air cells are clear. IMPRESSION: Atrophy with prior infarct involving the inferior left centrum semiovale extending inferiorly into the posterior aspect of the posterior limb of the left internal capsule. There is minimal periventricular small vessel disease. No acute appearing infarct. No mass or hemorrhage. Electronically Signed By: Lowella Grip III M.D. On: 02/18/2015 01:02   Dg Chest Port 1 View  02/18/2015 CLINICAL DATA: Acute onset of exertional dyspnea. Diaphoresis. Near syncope. Initial encounter. EXAM: PORTABLE CHEST - 1 VIEW COMPARISON: Chest radiograph performed 09/10/2013 FINDINGS: The lungs are well-aerated. Minimal left basilar opacity likely reflects atelectasis.  There is no evidence of pleural effusion or pneumothorax. The cardiomediastinal silhouette is within normal limits. No acute osseous abnormalities are seen. IMPRESSION: Minimal left basilar opacity likely reflects atelectasis. Lungs otherwise clear. Electronically Signed By: Garald Balding M.D. On: 02/18/2015 00:23   Dg Knee Complete 4 Views Right  02/17/2015 CLINICAL DATA: Pain following fall earlier today EXAM: RIGHT KNEE - COMPLETE 4+ VIEW COMPARISON: June 30, 2013 FINDINGS: Frontal, lateral, and bilateral oblique views were obtained. There is a comminuted fracture of the proximal tibia with fractures extending from the lateral aspect of the medial tibial plateau into the proximal tibial diaphysis. There also fractures in the proximal tibial metaphysis and medial tibial diaphysis. Alignment in these areas of fracture are near anatomic. No dislocation. There is a fat - fluid level in the suprapatellar bursa consistent with the fractures. No appreciable joint space narrowing. IMPRESSION: Comminuted fractures of the proximal tibia with major fracture fragments in overall near anatomic alignment. Fat-fluid level in the suprapatellar bursa consistent with the fractures. No dislocation. No appreciable joint space narrowing. Electronically Signed By: Lowella Grip III M.D. On: 02/17/2015 21:20     Assessment/Plan: Diagnosis:  Comminuted right proximal tibial fracture, nonoperative treatment with nonweightbearing in a patient with prior CVA 1. Does the need for close, 24 hr/day medical supervision in concert with the patient's rehab needs make it unreasonable for this patient to be served in a less intensive setting? Yes 2. Co-Morbidities requiring supervision/potential complications: History of prostate cancer, hypertension, history of alcoholic hepatitis 3. Due to bladder management, bowel management, safety, skin/wound care, disease management, medication administration, pain  management and patient education, does the patient require 24 hr/day rehab nursing? Yes 4. Does the patient require coordinated care of a physician, rehab nurse, PT (11-2 hrs/day, 5 days/week) and OT (1-2 hrs/day, 5 days/week) to address physical and functional deficits in the context of the above medical diagnosis(es)? Yes Addressing deficits in the following areas: balance, endurance, locomotion, strength, transferring, bowel/bladder control, bathing, dressing, feeding, grooming and toileting 5. Can the patient actively participate in an intensive therapy program of at least 3 hrs of therapy per day at least 5 days per week? Yes 6. The potential for patient to make measurable gains while on inpatient rehab is excellent 7. Anticipated functional outcomes upon discharge from inpatient rehab are modified independent and supervision with PT, modified independent and supervision with OT, n/a with SLP. 8. Estimated rehab length of stay to reach the above functional goals is: 5-7 days 9. Does the patient have adequate social supports and living environment to accommodate these discharge functional goals? Yes 10. Anticipated D/C setting: Home 11. Anticipated post D/C treatments: [place] therapy 12. Overall Rehab/Functional Prognosis: excellent  RECOMMENDATIONS: This patient's condition is appropriate for continued rehabilitative care in the following setting: CIR Patient has agreed to participate in recommended program. Yes Note that insurance prior authorization may be required for reimbursement for recommended care.  Comment:     02/19/2015       Revision History     Date/Time User Provider Type Action   02/19/2015 4:19 PM [name], MD Physician Sign   02/19/2015 10:59 AM Cathlyn Parsons, PA-C Physician Assistant [place]   View Details Report       Routing History     Date/Time From To Method   02/19/2015 4:19 PM [name], MD [name], MD In Basket    02/19/2015 4:19 PM [name], MD Levin Erp, MD Fax

## 2015-02-22 NOTE — Progress Notes (Signed)
59 y.o. right handed male with history of alcoholism, hypertension, prostate cancer with radical prostatectomy with postop CVA and right sided weakness maintained on aspirin. Patient did receive inpatient rehabilitation services after CVA February 2015. He lives with his family and assistance as needed independent without assistive device prior to admission. Presented 02/18/2015 after a fall trying to open a door with right lower extremity pain. Noted in the ED patient with brief loss of consciousness becoming diaphoretic. EKG normal sinus rhythm. CT of the head showed no acute appearing infarct. X-rays and imaging of right lower extremity showed right proximal tibia fracture. Orthopedic services Dr. Rhona Raider consulted advised no surgical intervention. Placed in a knee brace nonweightbearing for 2 months. Hospital course pain management. Subcutaneous Lovenox for DVT prophylaxis Subjective/Complaints: Poor intake for meals, was nauseated Some vomiting, feels shaky.  "Could this be alcohol withdrawal?  I feel shaky and sweaty" 2 loose BM yesterday pm Objective: Vital Signs: Blood pressure 175/92, pulse 68, temperature 98.7 F (37.1 C), temperature source Oral, resp. rate 18, height '5\' 10"'$  (1.778 m), weight 81.285 kg (179 lb 3.2 oz), SpO2 97 %. No results found. Results for orders placed or performed during the hospital encounter of 02/20/15 (from the past 72 hour(s))  CBC     Status: Abnormal   Collection Time: 02/22/15  4:06 AM  Result Value Ref Range   WBC 6.0 4.0 - 10.5 K/uL   RBC 3.22 (L) 4.22 - 5.81 MIL/uL   Hemoglobin 10.5 (L) 13.0 - 17.0 g/dL   HCT 31.3 (L) 39.0 - 52.0 %   MCV 97.2 78.0 - 100.0 fL   MCH 32.6 26.0 - 34.0 pg   MCHC 33.5 30.0 - 36.0 g/dL   RDW 12.6 11.5 - 15.5 %   Platelets 150 150 - 400 K/uL     HEENT: normal Cardio: RRR and no murmur Resp: CTA B/L and unlabored GI: BS positive and NT, ND Extremity:  Pulses positive and No Edema Skin:   Intact Neuro:  Alert/Oriented and Abnormal Motor 5/5 in BUE, 2- R HF, ANkle DF/PF, in knee immob, Left LE 4+/5 Musc/Skel:  Swelling right leg and foot, non tender no erythema Gen NAD   Assessment/Plan: 1. 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 Conservative care knee brace at all times nonweightbearing which require 3+ hours per day of interdisciplinary therapy in a comprehensive inpatient rehab setting. Physiatrist is providing close team supervision and 24 hour management of active medical problems listed below. Physiatrist and rehab team continue to assess barriers to discharge/monitor patient progress toward functional and medical goals. FIM: FIM - Bathing Bathing Steps Patient Completed: Chest, Right Arm, Left Arm, Abdomen, Right upper leg, Left upper leg Bathing: 3: Mod-Patient completes 5-7 48f10 parts or 50-74%  FIM - Upper Body Dressing/Undressing Upper body dressing/undressing: 0: Wears gown/pajamas-no public clothing FIM - Lower Body Dressing/Undressing Lower body dressing/undressing: 0: Wears gown/pajamas-no public clothing  FIM - Toileting Toileting steps completed by patient: Performs perineal hygiene, Adjust clothing after toileting Toileting Assistive Devices: Grab bar or rail for support Toileting: 3: Mod-Patient completed 2 of 3 steps  FIM - TRadio producerDevices: BRecruitment consultantTransfers: 4-To toilet/BSC: Min A (steadying Pt. > 75%)  FIM - Bed/Chair Transfer Bed/Chair Transfer Assistive Devices: Bed rails, Arm rests Bed/Chair Transfer: 4: Supine > Sit: Min A (steadying Pt. > 75%/lift 1 leg), 4: Bed > Chair or W/C: Min A (steadying Pt. >  75%)  FIM - Locomotion: Wheelchair Distance: 100 Locomotion: Wheelchair: 5: Travels 50 - 149 ft, turns around, maneuvers to table, bed or toilet, negotiates 3% grade: modified independent FIM - Locomotion: Ambulation Locomotion:  Ambulation Assistive Devices: Administrator Ambulation/Gait Assistance: 3: Mod assist, 1: +2 Total assist Locomotion: Ambulation: 1: Travels less than 50 ft with moderate assistance (Pt: 50 - 74%)  Comprehension Comprehension Mode: Auditory Comprehension: 5-Follows basic conversation/direction: With extra time/assistive device  Expression Expression Mode: Verbal Expression: 5-Expresses basic needs/ideas: With no assist  Social Interaction Social Interaction: 6-Interacts appropriately with others with medication or extra time (anti-anxiety, antidepressant).  Problem Solving Problem Solving: 5-Solves complex 90% of the time/cues < 10% of the time  Memory Memory: 6-More than reasonable amt of time  Medical Problem List and Plan: 1. Functional deficits secondary to right proximal tibia fracture. Conservative care knee brace at all times nonweightbearing 2. DVT Prophylaxis/Anticoagulation: Lovenox. Monitor for any bleeding episodes. Check vascular 3. Pain Management: off morphine, now just taking tylenol per pt request   Robaxin 4. Mood/anxiety: Prozac 20 mg daily. Provide emotional support 5. Neuropsych: This patient is capable of making decisions on his own behalf. 6. Skin/Wound Care: Routine skin checks 7. Fluids/Electrolytes/Nutrition: Routine I&O with follow-up chemistries 8. History of CVA February 2015.mainly RLE residual  Continue aspirin therapy 9. Hypertension. Lopressor 50 mg twice a day. Monitor with increased mobility 10. History of prostate cancer. Status post radical prostatectomy 11. Hyperlipidemia. Zocor 12. History of alcohol. Now showing signs of  Withdrawal, start Librium 13.  Poor intake  2 BMs over the weekend   LOS (Days) 2 A FACE TO FACE EVALUATION WAS PERFORMED  KIRSTEINS,ANDREW E 02/22/2015, 7:30 AM

## 2015-02-22 NOTE — Care Management Note (Signed)
Inpatient [place] Individual Statement of Services  Patient Name:  Tanner [name]  Date:  02/22/2015  Welcome to the [facility].  Our goal is to provide you with an individualized program based on your diagnosis and situation, designed to meet your specific needs.  With this comprehensive rehabilitation program, you will be expected to participate in at least 3 hours of rehabilitation therapies Monday-Friday, with modified therapy programming on the weekends.  Your rehabilitation program will include the following services:  Physical Therapy (PT), Occupational Therapy (OT), 24 hour per day rehabilitation nursing, Therapeutic Recreaction (TR), Case Management (Social Worker), Rehabilitation Medicine, Nutrition Services and Pharmacy Services  Weekly team conferences will be held on Tuesdays to discuss your progress.  Your Social Worker will talk with you frequently to get your input and to update you on team discussions.  Team conferences with you and your family in attendance may also be held.  Expected length of stay: 7-10 days  Overall anticipated outcome: modified independent  Depending on your progress and recovery, your program may change. Your Social Worker will coordinate services and will keep you informed of any changes. Your Social Worker's name and contact numbers are listed  below.  The following services may also be recommended but are not provided by the [employer] will be made to provide these services after discharge if needed.  Arrangements include referral to agencies that provide these services.  Your insurance has been verified to be:  Medicare Your primary doctor is:  Dr. Zada Girt  Pertinent information will be shared with your doctor and your insurance company.  Social Worker:  [place], [place] or [phone]   Information discussed with and copy given to patient by: Lennart Pall, 02/22/2015, 3:21 PM

## 2015-02-22 NOTE — Interval H&P Note (Signed)
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  The patient's history has been reviewed, patient examined, and there is no change in status.  Patient continues to be appropriate for intensive inpatient rehabilitation.  I have reviewed the patient's chart and labs.  Questions were answered to the patient's satisfaction. The PAPE has been reviewed and assessment remains appropriate.  Tanner [name] 02/22/2015, 6:58 AM

## 2015-02-22 NOTE — Progress Notes (Signed)
Physical Therapy Session Note  Patient Details  Name: Tanner [name] MRN: [id number] Date of Birth: 02-Mar-1956  Today's Date: 02/22/2015 PT Individual Time: 5374-8270 PT Individual Time Calculation (min): 30 min   Short Term Goals: Week 1:  PT Short Term Goal 1 (Week 1): STG=LTG due to LOS, Mod I transfers and short distance gait with RW  Skilled Therapeutic Interventions/Progress Updates:    Pt received supine in bed; c/o pain as described below and states "I don't know if I can do a whole hour but I'll try". States he is very fatigued from earlier sessions and RLE bothering him. Agreeable to light LE exercises in the room. Supine straight leg raise and hip abduction/adduction on LLE, quad sets on RLE due to pain with attempted straight leg raise, all performed 2 x 20. Supine>sit with supervision. Upon reaching sitting position pt requests RLE support with trashcan. Performed LLE hip flexion and long arc quad on edge of bed, 2 x 15 each. Pt declines further treatment at this time due to pain in RLE limiting performance, and pt fatigue from earlier sessions; missed 30 minutes skilled PT. Pt performed sit >supine with supervision and no assist needed for RLE management. Pt left supine in bed with all needs within reach and NA present at completion of session.   Therapy Documentation Precautions:  Precautions Precautions: Fall Required Braces or Orthoses: Knee Immobilizer - Right Knee Immobilizer - Right: On at all times Restrictions Weight Bearing Restrictions: Yes RLE Weight Bearing: Non weight bearing Pain: Pain Assessment Pain Assessment: 0-10 Pain Score: 7  Faces Pain Scale: Hurts even more Pain Type: Acute pain Pain Location: Leg Pain Orientation: Right Pain Descriptors / Indicators: Aching Pain Frequency: Intermittent Pain Onset: On-going Patients Stated Pain Goal: 3 Pain Intervention(s): Repositioned Multiple Pain Sites: No   See FIM for current functional  status  Therapy/Group: Individual Therapy  Luberta Mutter 02/22/2015, 3:39 PM

## 2015-02-22 NOTE — Progress Notes (Signed)
Patient information reviewed and entered into eRehab system by Evamae Rowen, RN, CRRN, PPS Coordinator.  Information including medical coding and functional independence measure will be reviewed and updated through discharge.     Per nursing patient was given "Data Collection Information Summary for Patients in Inpatient Rehabilitation Facilities with attached "Privacy Act Statement-Health Care Records" upon admission.  

## 2015-02-22 NOTE — H&P (View-Only) (Signed)
Physical Medicine and Rehabilitation Admission H&P   Chief Complaint  Patient presents with  . Fall  : HPI: Tanner [name] is a 59 y.o. right handed male with history of alcoholism, hypertension, prostate cancer with radical prostatectomy with postop CVA and right sided weakness maintained on aspirin. Patient did receive inpatient rehabilitation services after CVA February 2015. He lives with his family and assistance as needed independent without assistive device prior to admission. Presented 02/18/2015 after a fall trying to open a door with right lower extremity pain. Noted in the ED patient with brief loss of consciousness becoming diaphoretic. EKG normal sinus rhythm. CT of the head showed no acute appearing infarct. X-rays and imaging of right lower extremity showed right proximal tibia fracture. Orthopedic services Dr. Rhona Raider consulted advised no surgical intervention. Placed in a knee brace nonweightbearing for 2 months. Hospital course pain management. Subcutaneous Lovenox for DVT prophylaxis. Physical therapy evaluation completed 02/19/2015 with recommendations of physical medicine rehabilitation consult. Patient was admitted for comprehensive rehabilitation program  Pt still complaining of severe pain but is able to move the RLE slightly better  ROS Review of Systems  Constitutional: Negative for fever and chills.  Eyes: Negative for blurred vision.  Respiratory: Positive for cough.   Shortness of breath with exertion.  Cardiovascular: Negative for chest pain and palpitations.  Gastrointestinal: Positive for nausea and constipation.   GERD  Genitourinary: Positive for urgency.  Musculoskeletal: Positive for myalgias and falls.  Skin: Negative for rash.  Neurological: Positive for dizziness and weakness. Negative for headaches.  Psychiatric/Behavioral:   Anxiety    Past Medical History  Diagnosis Date  . Alcoholic hepatitis   .  Hypertension   . 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 Shortness of breath     new onset- occasionally-at rest and exertion  . GERD (gastroesophageal reflux disease)   . Anxiety   . TIA (transient ischemic attack) 08/2013  . Arthritis     " in my spine "  . Ileus 09/2013   Past Surgical History  Procedure Laterality Date  . Hand surgery Right   . Hernia repair    . Orif ankle fracture Right 12/10/2012    Procedure: OPEN REDUCTION INTERNAL FIXATION (ORIF) ANKLE FRACTURE; Surgeon: Hessie Dibble, MD; Location: [facility]; Service: Orthopedics; Laterality: Right;  . Robot assisted laparoscopic radical prostatectomy N/A 08/29/2013    Procedure: ROBOTIC ASSISTED LAPAROSCOPIC RADICAL PROSTATECTOMY LEVEL 2, Lysis of adhesions; Surgeon: Molli Hazard, MD; Location: [facility]; Service: Urology; Laterality: N/A;  . Lymphadenectomy Bilateral 08/29/2013    Procedure: LYMPHADENECTOMY "BILATERAL PELVIC LYMPH NODE DISSECTION"; Surgeon: Molli Hazard, MD; Location: [facility]; Service: Urology; Laterality: Bilateral;   Family History  Problem Relation Age of Onset  . Pancreatic cancer Sister   . Lung cancer Father    Social History:  reports that he quit smoking about 2 years ago. He has never used smokeless tobacco. He reports that he drinks alcohol. He reports that he uses illicit drugs. Allergies:  Allergies  Allergen Reactions  . Dilaudid [Hydromorphone Hcl] Nausea And Vomiting    Pre pt history  . Oxycodone Nausea And Vomiting  . Procaine Hcl Other (See Comments)    Patient states he is allergic to novicaine  . Zofran [Ondansetron] Nausea And Vomiting  . Sulfa Antibiotics Rash   Medications Prior to Admission  Medication Sig Dispense Refill  . aspirin 81 MG EC tablet Take 4 tablets (325 mg total) by mouth daily. 30 tablet 0  .  Aspirin-Acetaminophen-Caffeine (GOODYS EXTRA STRENGTH) 260-130-16 MG TABS Take 1 Package by mouth daily as needed (for pain).    . clonazePAM (KLONOPIN) 0.5 MG tablet Take 1 tablet (0.5 mg total) by mouth 3 (three) times daily as needed for anxiety. 90 tablet 0  . FLUoxetine (PROZAC) 20 MG capsule Take 1 capsule (20 mg total) by mouth daily. 30 capsule 3  . lisinopril (PRINIVIL,ZESTRIL) 40 MG tablet Take 40 mg by mouth daily.    . metoprolol (LOPRESSOR) 50 MG tablet Take 50 mg by mouth 2 (two) times daily.    Marland Kitchen omeprazole (PRILOSEC) 40 MG capsule Take 40 mg by mouth 2 (two) times daily.     . simvastatin (ZOCOR) 20 MG tablet Take 1 tablet (20 mg total) by mouth daily at 6 PM. 30 tablet 1  . ciprofloxacin (CIPRO) 500 MG tablet Take 1 tablet (500 mg total) by mouth 2 (two) times daily. (Patient not taking: Reported on 02/18/2015) 12 tablet 0  . HYDROcodone-acetaminophen (NORCO/VICODIN) 5-325 MG per tablet Take 1 tablet by mouth every 6 (six) hours as needed for moderate pain. (Patient not taking: Reported on 02/18/2015) 90 tablet 0  . hyoscyamine (LEVSIN, ANASPAZ) 0.125 MG tablet Take 1 tablet (0.125 mg total) by mouth every 4 (four) hours as needed (bladder spasms). (Patient not taking: Reported on 02/18/2015) 40 tablet 4  . lisinopril (PRINIVIL,ZESTRIL) 10 MG tablet Take 1 tablet (10 mg total) by mouth every morning. (Patient not taking: Reported on 02/18/2015) 30 tablet 1  . metoprolol (LOPRESSOR) 25 MG tablet Take 1 tablet (25 mg total) by mouth 2 (two) times daily. (Patient not taking: Reported on 02/18/2015) 60 tablet 1  . morphine (MSIR) 15 MG tablet Take 1 tablet (15 mg total) by mouth every 4 (four) hours as needed for moderate pain or severe pain. (Patient not taking: Reported on 02/18/2015) 60 tablet 0  . traZODone (DESYREL) 50 MG tablet Take 1 tablet (50 mg total) by mouth at bedtime as needed for sleep. 30 tablet 1  . vancomycin  (VANCOCIN) 50 mg/mL oral solution Take 5 mLs (250 mg total) by mouth every 6 (six) hours. Stop date March 27th, 2015 (Patient not taking: Reported on 02/18/2015) 100 mL 0    Home: Home Living Family/patient expects to be discharged to:: Private residence Living Arrangements: Children, Other relatives Available Help at Discharge: Family, Friend(s) Type of Home: Mobile home Home Access: Stairs to enter Technical brewer of Steps: 4-5 Entrance Stairs-Rails: Right Home Layout: One level Bathroom Shower/Tub: Gaffer, Charity fundraiser: Standard Home Equipment: Environmental consultant - 2 wheels, [employer] - quad, Crutches, Bedside commode, Shower seat  Functional History: Prior Function Level of Independence: Independent (slow gait) Comments: cane  Functional Status:  Mobility: Bed Mobility Overal bed mobility: Needs Assistance Bed Mobility: Supine to Sit, Sit to Supine Supine to sit: Min assist Sit to supine: Min assist General bed mobility comments: Heavy use of rails and A to manage R LE Transfers Overall transfer level: Needs assistance Equipment used: Rolling walker (2 wheeled) Transfers: Stand Pivot Transfers Sit to Stand: Mod assist, +2 safety/equipment Stand pivot transfers: +2 physical assistance, Min assist General transfer comment: Patient stated he was initially dizzy with sitting EOB but subsided and agreeable to transfer to recliner. +2 assist to SPT for safety and to ensure R LE NWB and balance. Patient able to take some pivotal hops to reclienr Ambulation/Gait General Gait Details: unable to attempt    ADL: ADL Overall ADL's : Needs assistance/impaired Eating/Feeding: Set up, Bed  level Grooming: Set up, Bed level Upper Body Bathing: Minimal assitance, Sitting Lower Body Bathing: Total assistance, Sit to/from stand, Cueing for safety Upper Body Dressing : Minimal assistance, Sitting Lower Body Dressing: Sit to/from stand, Total assistance, Cueing for  safety Toilet Transfer Details (indicate cue type and reason): Did not occur secondary to increased fatigue and dizziness in sitting and standing positions General ADL Comments: Pt limited secondary to pain, dizziness upon sitting/standing, and increased fatigue. Pt required heavy min assist to sit EOB, therapist providing 100% assistance with RLE (pt unable). Pt stood EOB with mod assist using RW, cues for hand placement and technique to adhere to NWB>RLE. Pt unsafe to d/c home alone. Will benefit from CIR.   Cognition: Cognition Overall Cognitive Status: Within Functional Limits for tasks assessed Cognition Arousal/Alertness: Awake/alert Behavior During Therapy: WFL for tasks assessed/performed Overall Cognitive Status: Within Functional Limits for tasks assessed  Physical Exam: Blood pressure 163/90, pulse 80, temperature 98.9 F (37.2 C), temperature source Oral, resp. rate 20, SpO2 99 %. Physical Exam Constitutional: He is oriented to person, place, and time.  HENT:  Head: Normocephalic.  Eyes: EOM are normal.  Neck: Normal range of motion. Neck supple. No thyromegaly present.  Cardiovascular: Normal rate and regular rhythm.  Respiratory: Effort normal and breath sounds normal. No respiratory distress.  GI: Soft. Bowel sounds are normal. He exhibits no distension.  Neurological: He is alert and oriented to person, place, and time.  Skin: Skin is warm and dry Brace in place to right lower extremity Motot 5/5 in BUE 5/5 in LLE Trace HF, 2- ankle DF/PF limited    Lab Results Last 48 Hours    Results for orders placed or performed during the hospital encounter of 02/17/15 (from the past 48 hour(s))  CBC with Differential Status: Abnormal   Collection Time: 02/17/15 10:46 PM  Result Value Ref Range   WBC 6.7 4.0 - 10.5 K/uL   RBC 4.08 (L) 4.22 - 5.81 MIL/uL   Hemoglobin 13.3 13.0 - 17.0 g/dL   HCT 39.3 39.0 - 52.0 %   MCV 96.3 78.0 -  100.0 fL   MCH 32.6 26.0 - 34.0 pg   MCHC 33.8 30.0 - 36.0 g/dL   RDW 12.8 11.5 - 15.5 %   Platelets 166 150 - 400 K/uL   Neutrophils Relative % 70 43 - 77 %   Neutro Abs 4.8 1.7 - 7.7 K/uL   Lymphocytes Relative 18 12 - 46 %   Lymphs Abs 1.2 0.7 - 4.0 K/uL   Monocytes Relative 9 3 - 12 %   Monocytes Absolute 0.6 0.1 - 1.0 K/uL   Eosinophils Relative 2 0 - 5 %   Eosinophils Absolute 0.2 0.0 - 0.7 K/uL   Basophils Relative 1 0 - 1 %   Basophils Absolute 0.0 0.0 - 0.1 K/uL  Comprehensive metabolic panel Status: Abnormal   Collection Time: 02/17/15 10:46 PM  Result Value Ref Range   Sodium 127 (L) 135 - 145 mmol/L   Potassium 4.7 3.5 - 5.1 mmol/L   Chloride 96 (L) 101 - 111 mmol/L   CO2 19 (L) 22 - 32 mmol/L   Glucose, Bld 91 65 - 99 mg/dL   BUN 13 6 - 20 mg/dL   Creatinine, Ser 1.32 (H) 0.61 - 1.24 mg/dL   Calcium 8.4 (L) 8.9 - 10.3 mg/dL   Total Protein 6.6 6.5 - 8.1 g/dL   Albumin 3.4 (L) 3.5 - 5.0 g/dL   AST 35 15 -  41 U/L   ALT 21 17 - 63 U/L   Alkaline Phosphatase 62 38 - 126 U/L   Total Bilirubin 1.0 0.3 - 1.2 mg/dL   GFR calc non Af Amer 59 (L) >60 mL/min   GFR calc Af Amer >60 >60 mL/min    Comment: (NOTE) The eGFR has been calculated using the CKD EPI equation. This calculation has not been validated in all clinical situations. eGFR's persistently <60 mL/min signify possible Chronic Kidney Disease.    Anion gap 12 5 - 15  Protime-INR Status: None   Collection Time: 02/17/15 11:22 PM  Result Value Ref Range   Prothrombin Time 14.9 11.6 - 15.2 seconds   INR 1.15 0.00 - 1.49  APTT Status: None   Collection Time: 02/17/15 11:22 PM  Result Value Ref Range   aPTT 27 24 - 37 seconds  I-stat troponin, ED (not at [facility], [facility]) Status: None   Collection Time: 02/17/15 11:44 PM  Result Value Ref  Range   Troponin i, poc 0.00 0.00 - 0.08 ng/mL   Comment 3       Comment: Due to the release kinetics of cTnI, a negative result within the first hours of the onset of symptoms does not rule out myocardial infarction with certainty. If myocardial infarction is still suspected, repeat the test at appropriate intervals.   Comprehensive metabolic panel Status: Abnormal   Collection Time: 02/18/15 4:15 AM  Result Value Ref Range   Sodium 134 (L) 135 - 145 mmol/L    Comment: DELTA CHECK NOTED   Potassium 3.9 3.5 - 5.1 mmol/L    Comment: DELTA CHECK NOTED NO VISIBLE HEMOLYSIS    Chloride 104 101 - 111 mmol/L   CO2 21 (L) 22 - 32 mmol/L   Glucose, Bld 103 (H) 65 - 99 mg/dL   BUN 10 6 - 20 mg/dL   Creatinine, Ser 1.17 0.61 - 1.24 mg/dL   Calcium 7.7 (L) 8.9 - 10.3 mg/dL   Total Protein 5.6 (L) 6.5 - 8.1 g/dL   Albumin 2.7 (L) 3.5 - 5.0 g/dL   AST 24 15 - 41 U/L   ALT 16 (L) 17 - 63 U/L   Alkaline Phosphatase 48 38 - 126 U/L   Total Bilirubin 0.7 0.3 - 1.2 mg/dL   GFR calc non Af Amer >60 >60 mL/min   GFR calc Af Amer >60 >60 mL/min    Comment: (NOTE) The eGFR has been calculated using the CKD EPI equation. This calculation has not been validated in all clinical situations. eGFR's persistently <60 mL/min signify possible Chronic Kidney Disease.    Anion gap 9 5 - 15  CBC with Differential/Platelet Status: Abnormal   Collection Time: 02/18/15 4:15 AM  Result Value Ref Range   WBC 7.8 4.0 - 10.5 K/uL   RBC 3.42 (L) 4.22 - 5.81 MIL/uL   Hemoglobin 11.3 (L) 13.0 - 17.0 g/dL   HCT 33.4 (L) 39.0 - 52.0 %   MCV 97.7 78.0 - 100.0 fL   MCH 33.0 26.0 - 34.0 pg   MCHC 33.8 30.0 - 36.0 g/dL   RDW 12.8 11.5 - 15.5 %   Platelets 122 (L) 150 - 400 K/uL   Neutrophils Relative % 80 (H) 43 - 77 %   Neutro Abs 6.3 1.7 - 7.7 K/uL    Lymphocytes Relative 11 (L) 12 - 46 %   Lymphs Abs 0.9 0.7 - 4.0 K/uL   Monocytes Relative 8 3 - 12 %   Monocytes Absolute  0.6 0.1 - 1.0 K/uL   Eosinophils Relative 1 0 - 5 %   Eosinophils Absolute 0.1 0.0 - 0.7 K/uL   Basophils Relative 0 0 - 1 %   Basophils Absolute 0.0 0.0 - 0.1 K/uL  Troponin I (q 6hr x 3) Status: None   Collection Time: 02/18/15 4:15 AM  Result Value Ref Range   Troponin I <0.03 <0.031 ng/mL    Comment:   NO INDICATION OF MYOCARDIAL INJURY.   Troponin I (q 6hr x 3) Status: None   Collection Time: 02/18/15 10:03 AM  Result Value Ref Range   Troponin I <0.03 <0.031 ng/mL    Comment:   NO INDICATION OF MYOCARDIAL INJURY.   Troponin I (q 6hr x 3) Status: None   Collection Time: 02/18/15 4:15 PM  Result Value Ref Range   Troponin I <0.03 <0.031 ng/mL    Comment:   NO INDICATION OF MYOCARDIAL INJURY.   CBC Status: Abnormal   Collection Time: 02/19/15 5:47 AM  Result Value Ref Range   WBC 5.3 4.0 - 10.5 K/uL   RBC 3.43 (L) 4.22 - 5.81 MIL/uL   Hemoglobin 11.1 (L) 13.0 - 17.0 g/dL   HCT 33.6 (L) 39.0 - 52.0 %   MCV 98.0 78.0 - 100.0 fL   MCH 32.4 26.0 - 34.0 pg   MCHC 33.0 30.0 - 36.0 g/dL   RDW 13.1 11.5 - 15.5 %   Platelets 107 (L) 150 - 400 K/uL    Comment: PLATELET COUNT CONFIRMED BY SMEAR  Basic metabolic panel Status: Abnormal   Collection Time: 02/19/15 5:47 AM  Result Value Ref Range   Sodium 135 135 - 145 mmol/L   Potassium 3.7 3.5 - 5.1 mmol/L   Chloride 104 101 - 111 mmol/L   CO2 23 22 - 32 mmol/L   Glucose, Bld 95 65 - 99 mg/dL   BUN <5 (L) 6 - 20 mg/dL   Creatinine, Ser 0.89 0.61 - 1.24 mg/dL   Calcium 7.9 (L) 8.9 - 10.3 mg/dL   GFR calc non Af Amer >60 >60 mL/min   GFR calc Af Amer >60 >60 mL/min    Comment: (NOTE) The  eGFR has been calculated using the CKD EPI equation. This calculation has not been validated in all clinical situations. eGFR's persistently <60 mL/min signify possible Chronic Kidney Disease.    Anion gap 8 5 - 15      Imaging Results (Last 48 hours)    Dg Tibia/fibula Right  02/17/2015 CLINICAL DATA: Further evaluation of known tibial fracture. Initial encounter. EXAM: RIGHT TIBIA AND FIBULA - 2 VIEW COMPARISON: Right tibia/fibula radiographs performed 12/09/2012 FINDINGS: As noted on recent prior studies, there is a comminuted oblique slightly displaced fracture extending across the proximal tibial metadiaphysis. An associated moderate knee joint effusion is noted. There is also a minimally displaced fracture involving the proximal fibula. Distal fibular hardware appears grossly intact, without evidence of loosening. Distal tibial screws are also grossly unremarkable. Lateral soft tissue swelling is noted at the ankle. The ankle mortise is grossly unremarkable. IMPRESSION: 1. Comminuted oblique slightly displaced fracture extending across the proximal tibial metadiaphysis, with associated moderate knee joint effusion. 2. Minimally displaced fracture involving the proximal fibula.  Electronically Signed By: Garald Balding M.D. On: 02/17/2015 23:24   Ct Head Wo Contrast  02/18/2015 CLINICAL DATA: Syncopal episode earlier today. Similar episode 2 weeks prior EXAM: CT HEAD WITHOUT CONTRAST TECHNIQUE: Contiguous axial images were obtained from the base of the skull through the  vertex without intravenous contrast. COMPARISON: Head CT September 06, 2013. Brain MRI September 02, 2013 FINDINGS: Mild generalized atrophy is stable. There is no demonstrable mass, hemorrhage, extra-axial fluid collection, or midline shift. There is decreased attenuation in the inferior left centrum semiovale with extension inferiorly to the posterior aspect of the posterior limb of the left internal  capsule consistent with prior infarct. Elsewhere, there is minimal periventricular small vessel disease. No acute infarct appreciable. : Bony calvarium appears intact. The mastoid air cells are clear. IMPRESSION: Atrophy with prior infarct involving the inferior left centrum semiovale extending inferiorly into the posterior aspect of the posterior limb of the left internal capsule. There is minimal periventricular small vessel disease. No acute appearing infarct. No mass or hemorrhage. Electronically Signed By: Lowella Grip III M.D. On: 02/18/2015 01:02   Dg Chest Port 1 View  02/18/2015 CLINICAL DATA: Acute onset of exertional dyspnea. Diaphoresis. Near syncope. Initial encounter. EXAM: PORTABLE CHEST - 1 VIEW COMPARISON: Chest radiograph performed 09/10/2013 FINDINGS: The lungs are well-aerated. Minimal left basilar opacity likely reflects atelectasis. There is no evidence of pleural effusion or pneumothorax. The cardiomediastinal silhouette is within normal limits. No acute osseous abnormalities are seen. IMPRESSION: Minimal left basilar opacity likely reflects atelectasis. Lungs otherwise clear. Electronically Signed By: Garald Balding M.D. On: 02/18/2015 00:23   Dg Knee Complete 4 Views Right  02/17/2015 CLINICAL DATA: Pain following fall earlier today EXAM: RIGHT KNEE - COMPLETE 4+ VIEW COMPARISON: June 30, 2013 FINDINGS: Frontal, lateral, and bilateral oblique views were obtained. There is a comminuted fracture of the proximal tibia with fractures extending from the lateral aspect of the medial tibial plateau into the proximal tibial diaphysis. There also fractures in the proximal tibial metaphysis and medial tibial diaphysis. Alignment in these areas of fracture are near anatomic. No dislocation. There is a fat - fluid level in the suprapatellar bursa consistent with the fractures. No appreciable joint space narrowing. IMPRESSION: Comminuted fractures of the  proximal tibia with major fracture fragments in overall near anatomic alignment. Fat-fluid level in the suprapatellar bursa consistent with the fractures. No dislocation. No appreciable joint space narrowing. Electronically Signed By: Lowella Grip III M.D. On: 02/17/2015 21:20        Medical Problem List and Plan: 1. Functional deficits secondary to right proximal tibia fracture. Conservative care knee brace at all times nonweightbearing 2. DVT Prophylaxis/Anticoagulation: Lovenox. Monitor for any bleeding episodes. Check vascular 3. Pain Management: MSIR as needed as well as Robaxin 4. Mood/anxiety: Prozac 20 mg daily. Provide emotional support 5. Neuropsych: This patient is capable of making decisions on his own behalf. 6. Skin/Wound Care: Routine skin checks 7. Fluids/Electrolytes/Nutrition: Routine I&O with follow-up chemistries 8. History of CVA February 2015. Continue aspirin therapy 9. Hypertension. Lopressor 50 mg twice a day. Monitor with increased mobility 10. History of prostate cancer. Status post radical prostatectomy 11. Hyperlipidemia. Zocor 12. History of alcohol. Monitor for any withdrawal  Post Admission Physician Evaluation: Functional deficits secondary to right proximal tibia fracture. Conservative care knee brace at all times nonweightbearing 1. . 2. Patient is admitted to receive collaborative, interdisciplinary care between the physiatrist, rehab nursing staff, and therapy team. 3. Patient's level of medical complexity and substantial therapy needs in context of that medical necessity cannot be provided at a lesser intensity of care such as a SNF. 4. Patient has experienced substantial functional loss from his/her baseline which was documented above under the "Functional History" and "Functional Status" headings. Judging by the patient's diagnosis, physical exam, and  functional history, the patient has potential for functional progress which will result  in measurable gains while on inpatient rehab. These gains will be of substantial and practical use upon discharge in facilitating mobility and self-care at the household level. 5. Physiatrist will provide 24 hour management of medical needs as well as oversight of the therapy plan/treatment and provide guidance as appropriate regarding the interaction of the two. 6. 24 hour rehab nursing will assist with bladder management, bowel management, safety, skin/wound care, disease management, medication administration, pain management and patient education and help integrate therapy concepts, techniques,education, etc. 7. PT will assess and treat for/with: pre gait, gait training, endurance , safety, equipment, neuromuscular re education. Goals are: Mod I. 8. OT will assess and treat for/with: ADLs, Cognitive perceptual skills, Neuromuscular re education, safety, endurance, equipment. Goals are: Mod I. Therapy may proceed with showering this patient. 9. SLP will assess and treat for/with: NA. Goals are: NA. 10. Case Management and Social Worker will assess and treat for psychological issues and discharge planning. 11. Team conference will be held weekly to assess progress toward goals and to determine barriers to discharge. 12. Patient will receive at least 3 hours of therapy per day at least 5 days per week. 13. ELOS: 5-7days  14. Prognosis: excellent     [name] M.D. [facility] FAAPM&R (Sports Med, Neuromuscular Med) Diplomate Am Board of Electrodiagnostic Med  02/19/2015

## 2015-02-22 NOTE — Progress Notes (Signed)
Occupational Therapy Session Note  Patient Details  Name: Tanner [name] MRN: [id number] Date of Birth: 08/23/1955  Today's Date: 02/22/2015 OT Individual Time: 0830-1000 OT Individual Time Calculation (min): 90 min   Short Term Goals: Week 1:  OT Short Term Goal 1 (Week 1): STG=LTG due short anticipated LOS  Skilled Therapeutic Interventions/Progress Updates: ADL-retraining with focus on improved functional mobility, activity tolerance, dynamic standing balance, and use of AE for dressing.   Pt received supine in bed with visitors present.   OT clarified purpose of treatment and visitors exited room.   Pt completes bed mobility unassisted using bed rail but requires steadying assist and min vc to complete transfer to [facility] to toilet.   Pt requires assist for hygiene and ambulates to sink to bathe/dress.   Pt bathes upper and lower body to upper leg level and requires assist to wash buttocks.   Stands with steadying assist to sustain static standing balance with BUE at sink as therapist washes his buttocks .   Pt returns to w/c seat to groom at sink with extra time a min assist to trim beard d/t tremors (pt suspects tremors d/t onging detox from ETOH episode prior to admission).   Pt request return to bed at end of session d/t fatigue with all needs placed within reach.     Therapy Documentation Precautions:  Precautions Precautions: Fall Required Braces or Orthoses: Knee Immobilizer - Right Knee Immobilizer - Right: On at all times Restrictions Weight Bearing Restrictions: Yes RLE Weight Bearing: Non weight bearing  Pain: Pain Assessment Pain Score: 2   See FIM for current functional status  Therapy/Group: Individual Therapy   Second session: Time: 1100-1200 Time Calculation (min):  60 min  Pain Assessment: 4/10, RLE  Skilled Therapeutic Interventions: ADL-retraining (30 min) with focus on toilet transfer, toileting, and functional mobility.   Pt received supine in bed again and  requesting toileting for BM.   Pt ambulates to bathroom and attempts transfer to toilet seat but requires assist with managing DME and requests BSC instead due to sudden onset of weakness while stepping backwards to toilet.   Pt voids medium BM on BSC and is unable to complete toilet hygiene or attempt lateral leans d/t impaired [place] owing to bilateral Dupytren's contractures.   OT completes toilet hygiene for patient.   Pt then washes his hands at sink and resuming treatment plan to go to the gym and exercise to improve his endurance.    Pt completes TTherapeutic exercise (30 min) with focus on general endurance, BUE strengthening.   Pt performs 10 of arm ergometry using Sci-Fit, level 1, 5 minutes forward and 5 means backwards.   Pt rests briefly and is instructed on BUE strengthening using theraband.   Pt completes chest press and seated row, 10 reps, orange band, with good effort.   Pt is escorted back to his room at end of session with RN present to assist, prn.     See FIM for current functional status  Therapy/Group: Individual Therapy  [place] 02/22/2015, 12:26 PM

## 2015-02-22 NOTE — IPOC Note (Signed)
Overall Plan of Care [facility]) Patient Details Name: Tanner [name] MRN: [id number] DOB: [birth year]  Admitting Diagnosis: R tib fx   Hospital Problems: Active Problems:   Tibia fracture     Functional Problem List: Nursing Bowel, Edema, Endurance, Medication Management, Motor, Pain, Safety, Skin Integrity  PT Balance, Edema, Endurance, Motor, Pain, Safety, Sensory  OT Balance, Endurance, Motor, Pain, Safety  SLP    TR         Basic ADL's: OT Grooming, Bathing, Dressing, Toileting     Advanced  ADL's: OT Simple Meal Preparation     Transfers: PT Bed Mobility, Bed to Chair, Car  OT Toilet     Locomotion: PT Ambulation, Wheelchair Mobility, Stairs     Additional Impairments: OT None  SLP        TR      Anticipated Outcomes Item Anticipated Outcome  Self Feeding independent  Swallowing      Basic self-care  mod I  Toileting  mod I    Bathroom Transfers mod I to [facility]  Bowel/Bladder  MOD I with bowel and bladder  Transfers  Mod I   Locomotion  Mod I short distances  Communication     Cognition     Pain  Less than 4  Safety/Judgment  Remain safe throughout Rehab stay   Therapy Plan: PT Intensity: Minimum of 1-2 x/day ,45 to 90 minutes PT Frequency: 5 out of 7 days PT Duration Estimated Length of Stay: 7-10 days OT Intensity: Minimum of 1-2 x/day, 45 to 90 minutes OT Frequency: 5 out of 7 days OT Duration/Estimated Length of Stay: 5-7 days         Team Interventions: Nursing Interventions Patient/Family Education, Bowel Management, Disease Management/Prevention, Pain Management, Medication Management, Skin Care/Wound Management, Discharge Planning, Psychosocial Support  PT interventions Ambulation/gait training, Balance/vestibular training, Discharge planning, Disease management/prevention, DME/adaptive equipment instruction, Functional mobility training, Neuromuscular re-education, Pain management, Patient/family education, Psychosocial support, Skin  care/wound management, Splinting/orthotics, Stair training, Therapeutic Activities, Therapeutic Exercise, UE/LE Strength taining/ROM, Wheelchair propulsion/positioning  OT Interventions Balance/vestibular training, Functional mobility training, Neuromuscular re-education, Patient/family education, Self Care/advanced ADL retraining, Therapeutic Activities  SLP Interventions    TR Interventions    SW/CM Interventions Discharge Planning, Psychosocial Support, Patient/Family Education    Team Discharge Planning: Destination: PT-Home ,OT- Home , SLP-  Projected Follow-up: PT-Home health PT, 24 hour supervision/assistance, Other (comment) (Pt will have only intermittent S), OT-  Home health OT, SLP-  Projected Equipment Needs: PT-To be determined (WC needed? ), OT- 3 in 1 bedside comode, Standard walker, SLP-  Equipment Details: PT-Has RW, sister may have appropriate WC to fit mobile home, but not elevating leg rest, OT- (pt has wc, cane, crutches, 2WW, BSC, shower seat) Patient/family involved in discharge planning: PT- Patient,  OT-Patient, SLP-   MD ELOS: 5-7 days Medical Rehab Prognosis:  Good Assessment: 59 year old male who had a stroke approximately 18 months ago. He has a history of alcohol abuse. He fell at home and suffered a right tibial plateau fracture.  Now requiring 24/7 Rehab RN,MD, as well as CIR level PT, OT .  Treatment team will focus on ADLs and mobility with goals set at Modified independent  See Team Conference Notes for weekly updates to the plan of care

## 2015-02-23 ENCOUNTER — Inpatient Hospital Stay (HOSPITAL_COMMUNITY): Payer: Self-pay | Admitting: Rehabilitation

## 2015-02-23 ENCOUNTER — Inpatient Hospital Stay (HOSPITAL_COMMUNITY): Payer: Medicare Other

## 2015-02-23 DIAGNOSIS — I82441 Acute embolism and thrombosis of right tibial vein: Secondary | ICD-10-CM

## 2015-02-23 MED ORDER — RIVAROXABAN 20 MG PO TABS: 20.0000 mg | ORAL_TABLET | Freq: Every day | ORAL | Status: DC

## 2015-02-23 MED ORDER — RIVAROXABAN 15 MG PO TABS
15.0000 mg | ORAL_TABLET | Freq: Two times a day (BID) | ORAL | Status: DC
  Administered 2015-02-23 – 2015-03-02 (×15): 15 mg via ORAL
  Filled 2015-02-23 (×19): qty 1

## 2015-02-23 NOTE — Progress Notes (Signed)
Social Work  Social Work Assessment and Plan  Patient Details  Name: Tanner [name] MRN: [id number] Date of Birth: 1956-04-22  Today's Date: 02/22/2015  Problem List:  Patient Active Problem List   Diagnosis Date Noted  . Tibia fracture 02/20/2015  . Tibial fracture 02/18/2015  . Syncope 02/18/2015  . Essential hypertension 02/18/2015  . History of stroke 02/18/2015  . Fracture of tibia, right, closed   . C. difficile colitis 09/11/2013  . UTI (urinary tract infection) 09/11/2013  . Essential hypertension, benign 09/11/2013  . Ileus 09/10/2013  . Spastic hemiplegia affecting dominant side 09/09/2013  . Ileus, postoperative 09/09/2013  . Hypokalemia 09/09/2013  . Nonspecific (abnormal) findings on radiological and other examination of gastrointestinal tract 09/09/2013  . CVA (cerebral infarction) 09/05/2013  . 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 Malignant neoplasm of prostate 08/29/2013  . Prostate cancer 05/22/2013  . Hyponatremia 12/11/2012    Class: Chronic  . Ankle fracture 12/10/2012   Past Medical History:  Past Medical History  Diagnosis Date  . Alcoholic hepatitis   . Hypertension   . 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 Shortness of breath     new onset- occasionally-at rest and exertion  . GERD (gastroesophageal reflux disease)   . Anxiety   . TIA (transient ischemic attack) 08/2013  . Arthritis     " in my spine "  . Ileus 09/2013   Past Surgical History:  Past Surgical History  Procedure Laterality Date  . Hand surgery Right   . Hernia repair    . Orif ankle fracture Right 12/10/2012    Procedure: OPEN REDUCTION INTERNAL FIXATION (ORIF) ANKLE FRACTURE;  Surgeon: Hessie Dibble, MD;  Location: [facility];  Service: Orthopedics;  Laterality: Right;  . Robot assisted laparoscopic radical prostatectomy N/A 08/29/2013    Procedure: ROBOTIC ASSISTED LAPAROSCOPIC RADICAL PROSTATECTOMY LEVEL 2, Lysis of adhesions;  Surgeon: Molli Hazard,  MD;  Location: [facility];  Service: Urology;  Laterality: N/A;  . Lymphadenectomy Bilateral 08/29/2013    Procedure: LYMPHADENECTOMY "BILATERAL PELVIC LYMPH NODE DISSECTION";  Surgeon: Molli Hazard, MD;  Location: [facility];  Service: Urology;  Laterality: Bilateral;   Social History:  reports that he quit smoking about 2 years ago. He has never used smokeless tobacco. He reports that he drinks alcohol. He reports that he uses illicit drugs.  Family / Support Systems Marital Status: Divorced How Long?: x 2 Patient Roles: Parent, Other (Comment) (has siblings) Children: daughter, Megan Hayduk @ [phone] who lives with pt along with her boyfriend Other Supports: sister, Sandria Manly @ (C) [phone] and sister, Aurelio Brash @ [phone] - both living locally.  Also,ex-wife, Wonda Amis @ (C) [phone] lives across the street and is supportive. Anticipated Caregiver: daughter and sisters Ability/Limitations of Caregiver: Daughter works p/t and is returning to school soon.  sisters and ex-wife can provide intermittent support. Caregiver Availability: Intermittent Family Dynamics: Pt describes good support from all family and "even my ex".  Denies any concerns about d/c.  Social History Preferred language: English Religion: Baptist Cultural Background: NA Education: HS Read: Yes Write: Yes Employment Status: Disabled Date Retired/Disabled/Unemployed: 2014 Freight forwarder Issues: None Guardian/Conservator: None - per MD, pt capable of making decisions on his own behalf   Abuse/Neglect Physical Abuse: Denies Verbal Abuse: Denies Sexual Abuse: Denies Exploitation of patient/patient's resources: Denies Self-Neglect: Denies  Emotional Status Pt's affect, behavior adn adjustment status: Pt very pleasant and talkative.  He admits frustration  with fall, injury and now the limitations he will have at home/ community level.  He denies any significant s/s of emotional  distress.  No s/s of depression so formal screen deferred at this point.  I will monitor and refer to neuropsychology as needed. Recent Psychosocial Issues: None significant per pt. Pyschiatric History: NA Substance Abuse History: Pt with h/o ETOH abuse.    Patient / Family Perceptions, Expectations & Goals Pt/Family understanding of illness & functional limitations: Pt and family with good, basic understanding of his injury, WB limitations and overall functional limitations / need for CIR. Premorbid pt/family roles/activities: Pt was independent PTA and sharing home responsibilities with daughter. Anticipated changes in roles/activities/participation: family will need to provide some caregiver and home management assistance initially Pt/family expectations/goals: Pt hopes to reach some level of independence as his assistance is intermittent  Recruitment consultant: None Premorbid Home Care/DME Agencies: None Transportation available at discharge: yes  Discharge Planning Living Arrangements: Children, Non-relatives/Friends Support Systems: Children, Other relatives, Friends/neighbors Type of Residence: Private residence Insurance Resources: Chartered certified accountant Resources: [employer] Screen Referred: No Living Expenses: Higher education careers adviser Management: Patient Does the patient have any problems obtaining your medications?: No Home Management: pt and daughter Patient/Family Preliminary Plans: Pt plans to return to his home with intermittent assist of family/ friends Social Work Anticipated Follow Up Needs: HH/OP Expected length of stay: 7-10 days  Clinical Impression Very pleasant gentleman here following a fall/ fx at home.  Living with his 57 yr old daughter who can provide intermittent assistance along with additional family members.  Anticipate short LOS.  Pt denies any s/s of emotional distress but will monitor.  To follow for d/c planning and support.  Jacqlyn Marolf,  Khori Underberg 02/23/2015, 3:49 PM

## 2015-02-23 NOTE — Discharge Instructions (Addendum)
Information on my medicine - XARELTO (rivaroxaban)  This medication education was reviewed with me or my healthcare representative as part of my discharge preparation.    WHY WAS XARELTO PRESCRIBED FOR YOU? Xarelto was prescribed to treat blood clots that may have been found in the veins of your legs (deep vein thrombosis) or in your lungs (pulmonary embolism) and to reduce the risk of them occurring again.  What do you need to know about Xarelto? The starting dose is one 15 mg tablet taken TWICE daily with food for the FIRST 21 DAYS then on 03/16/2015  the dose is changed to one 20 mg tablet taken ONCE A DAY with your evening meal.  DO NOT stop taking Xarelto without talking to the health care provider who prescribed the medication.  Refill your prescription for 20 mg tablets before you run out.  After discharge, you should have regular check-up appointments with your healthcare provider that is prescribing your Xarelto.  In the future your dose may need to be changed if your kidney function changes by a significant amount.  What do you do if you miss a dose? If you are taking Xarelto TWICE DAILY and you miss a dose, take it as soon as you remember. You may take two 15 mg tablets (total 30 mg) at the same time then resume your regularly scheduled 15 mg twice daily the next day.  If you are taking Xarelto ONCE DAILY and you miss a dose, take it as soon as you remember on the same day then continue your regularly scheduled once daily regimen the next day. Do not take two doses of Xarelto at the same time.   Important Safety Information Xarelto is a blood thinner medicine that can cause bleeding. You should call your healthcare provider right away if you experience any of the following: ? Bleeding from an injury or your nose that does not stop. ? Unusual colored urine (red or dark brown) or unusual colored stools (red or black). ? Unusual bruising for unknown reasons. ? A serious fall  or if you hit your head (even if there is no bleeding).  Some medicines may interact with Xarelto and might increase your risk of bleeding while on Xarelto. To help avoid this, consult your healthcare provider or pharmacist prior to using any new prescription or non-prescription medications, including herbals, vitamins, non-steroidal anti-inflammatory drugs (NSAIDs) and supplements.  This website has more information on Xarelto: [email or web address] Inpatient Rehab Discharge Instructions  Tanner [name] Discharge date and time: No discharge date for patient encounter.   Activities/Precautions/ Functional Status: Activity: Nonweightbearing right lower extremity with knee brace Diet: regular diet Wound Care: none needed Functional status:  ___ No restrictions     ___ Walk up steps independently ___ 24/7 supervision/assistance   ___ Walk up steps with assistance ___ Intermittent supervision/assistance  ___ Bathe/dress independently ___ Walk with walker     ___ Bathe/dress with assistance ___ Walk Independently    ___ Shower independently _x__ Walk with assistance    ___ Shower with assistance ___ No alcohol     ___ Return to work/school ________    COMMUNITY REFERRALS UPON DISCHARGE:    Home Health:   PT     OT                      Agency:  [facility] Phone: [phone]   Medical Equipment/Items Ordered: wheelchair, tub bench  Agency/Supplier: [facility] @ [phone]    Special Instructions: NO ALCOHOL    RESUME ASPIRIN AFTER XARELTO COMPLETED   My questions have been answered and I understand these instructions. I will adhere to these goals and the provided educational materials after my discharge from the hospital.  Patient/Caregiver Signature _______________________________ Date __________  Clinician Signature _______________________________________ Date __________  Please bring this form and your medication list with you  to all your follow-up doctor's appointments.

## 2015-02-23 NOTE — Progress Notes (Signed)
Physical Therapy Session Note  Patient Details  Name: Tanner [name] MRN: [id number] Date of Birth: 10-21-1955  Today's Date: 02/23/2015 PT Individual Time: 1829-9371 PT Individual Time Calculation (min): 45 min   Short Term Goals: Week 1:  PT Short Term Goal 1 (Week 1): STG=LTG due to LOS, Mod I transfers and short distance gait with RW  Skilled Therapeutic Interventions/Progress Updates:   Pt received sitting in recliner in room, agreeable to therapy session.  Transferred to recliner at S to min/guard level with min cues for hand placement.  Pt propelled to/from therapy gym for increased activity tolerance and UE strength at S level with BUEs x 200' x 2 reps.  Skilled session focused on car transfer for safe D/C home as well as stair training in various techniques to assess safety for D/C home.  Performed car transfer at min A level in which he needed assist for RLE into car.  Provided cues for reclining seat and scooting up onto seat to allow increased room for RLE due to knee immobilizer.  Pt verbalized understanding.  Propelled to therapy gym to perform stairs.  Provided demonstration and cues for how to perform with RW vs single crutch and rail.  Attempted first with single crutch and rail and pt requires up to max A for safety with heavy cues for maintaining NWB on RLE.  Progressed to performing with RW with min/mod A (+2A for safety) with heavy cues for WB status and ensuring L foot completely on step as to prevent forward sliding of foot.  Will continue to practice during sessions for safe D/C home.  Pt propelled back to room and transferred to bed.  All needs in reach.   Therapy Documentation Precautions:  Precautions Precautions: Fall Required Braces or Orthoses: Knee Immobilizer - Right Knee Immobilizer - Right: On at all times Restrictions Weight Bearing Restrictions: Yes RLE Weight Bearing: Non weight bearing   Vital Signs: Therapy Vitals Temp: 98.3 F (36.8 C) Temp Source:  Oral Pulse Rate: 83 Resp: 18 BP: 117/77 mmHg Patient Position (if appropriate): Sitting Oxygen Therapy SpO2: 97 % O2 Device: Not Delivered Pain: Pt with 6/10 pain in RLE, RN provided meds during session.    Function:  Herbalist Devices: Grab bar or rail   Bed Mobility Roll left and right activity      Sit to lying activity      Lying to sitting activity      Mobility details     Transfers Sit to stand transfer        Chair/bed transfer               Physiological scientist transfer assistive device: Publishing rights manager transfer assist level: Touching or steadying assistance (Pt > 75%, lift 1 leg)    Locomotion Ambulation          Walk 10 feet activity      Walk 50 feet with 2 turns activity      Walk 150 feet activity      Walk 10 feet on uneven surfaces activity      Stairs   Stairs assistive device: Walker;Other (comment) (also attempted single crutch and rail) Max number of stairs: 3 Stairs assist level: Moderate assist (Pt 50 - 74%);2 helpers (+2 for safety)  Walk up/down 1 step activity     Walk up/down 1 step (curb) assist level: Moderate assist (Pt 50 - 74%);2  helpers (+2 for safety)  Walk up/down 4 steps activity      Walk up/down 12 steps activity      Pick up small objects from floor      Wheelchair          Wheel 50 feet with 2 turns activity      Wheel 150 feet activity       Cognition Comprehension Comprehension assist level: Understands complex 90% of the time/cues 10% of the time  Expression Expression assist level: Expresses complex 90% of the time/cues < 10% of the time  Social Interaction Social Interaction assist level: Interacts appropriately with others with medication or extra time (anti-anxiety, antidepressant).  Problem Solving Problem solving assist level: Solves complex 90% of the time/cues < 10% of the time  Memory Memory assist level: Complete Independence: No helper     Therapy/Group: Individual Therapy  Denice Bors 02/23/2015, 5:32 PM

## 2015-02-23 NOTE — Progress Notes (Signed)
Physical Therapy Session Note  Patient Details  Name: Tanner [name] MRN: [id number] Date of Birth: September 17, 1955  Today's Date: 02/23/2015 PT Individual Time: 0800-0900 PT Individual Time Calculation (min): 60 min   Short Term Goals: Week 1:  PT Short Term Goal 1 (Week 1): STG=LTG due to LOS, Mod I transfers and short distance gait with RW  Skilled Therapeutic Interventions/Progress Updates:   Pt received lying in bed, agreeable to therapy session.  Stating increased pain in L knee and requesting pain medication.  RN made aware and pain meds provided during session.  Skilled session focused on bed mobility, functional transfers with RW, w/c propulsion, and supine therex.  See below for full details on transfers.  Continues to require min cues for safety during transfer as well as maintaining RLE NWB, upright posture and reaching back for chair when sitting.  Sit<>stand continued to improve during session, esp from w/c.  Switched w/c out for different 20x18 (did not have 18x18 available) with new R elevating leg rest for improved comfort and positioning.  Performed supine therex as follows; RLE SLR x 10 reps (active assisted), R hip abd x 10 reps and quad sets x 10 reps.  Transferred back to w/c and assisted to room.  Left with all needs in reach.   Therapy Documentation Precautions:  Precautions Precautions: Fall Required Braces or Orthoses: Knee Immobilizer - Right Knee Immobilizer - Right: On at all times Restrictions Weight Bearing Restrictions: Yes RLE Weight Bearing: Non weight bearing  Pain: Pain Assessment Pain Assessment: 0-10 Pain Score: 3  Pain Type: Acute pain Pain Location: Leg Pain Orientation: Left Pain Descriptors / Indicators: Aching Pain Frequency: Intermittent Pain Onset: On-going Patients Stated Pain Goal: 3 Pain Intervention(s): Medication (See eMAR)   Function:  Toileting Toileting       Bed Mobility Roll left and right activity      Sit to lying  activity      Lying to sitting activity      Mobility details     Transfers Sit to stand transfer   Sit to stand assist level: Touching or steadying assistance (Pt > 75%/lift 1 leg) Sit to stand assistive device: Walker;Orthosis (Knee immobilizer)  Chair/bed transfer   Chair/bed transfer method: Stand pivot Chair/bed transfer assist level: Touching or steadying assistance (Pt > 75%) Chair/bed transfer assistive device: Walker   Chair/bed transfer details: Verbal cues for sequencing;Verbal cues for technique;Verbal cues for precautions/safety;Verbal cues for safe use of DME/AE   Loss adjuster, chartered Ambulation          Walk 10 feet activity      Walk 50 feet with 2 turns activity      Walk 150 feet activity      Walk 10 feet on uneven surfaces activity      Stairs          Walk up/down 1 step activity        Walk up/down 4 steps activity      Walk up/down 12 steps activity      Pick up small objects from floor      Wheelchair   Type: Manual Max wheelchair distance: 150 Assist Level: Supervision or verbal cues  Wheel 50 feet with 2 turns activity   Assist Level: Supervision or verbal cues  Wheel 150 feet activity   Assist Level: Supervision or verbal cues     Therapy/Group:  Individual Therapy  Denice Bors 02/23/2015, 12:12 PM

## 2015-02-23 NOTE — Progress Notes (Signed)
ANTICOAGULATION CONSULT NOTE - Initial Consult  Pharmacy Consult for xarelto  Indication: DVT  Allergies  Allergen Reactions  . Dilaudid [Hydromorphone Hcl] Nausea And Vomiting    Pre pt history  . Oxycodone Nausea And Vomiting  . Procaine Hcl Other (See Comments)    Patient states he is allergic to novicaine  . Zofran [Ondansetron] Nausea And Vomiting  . Sulfa Antibiotics Rash    Patient Measurements: Height: '5\' 10"'$  (177.8 cm) (per patient ) Weight: 179 lb 3.2 oz (81.285 kg) IBW/kg (Calculated) : 73  Vital Signs: Temp: 98.3 F (36.8 C) (08/16 1351) Temp Source: Oral (08/16 1351) BP: 117/77 mmHg (08/16 1351) Pulse Rate: 83 (08/16 1351)  Labs:  Recent Labs  02/22/15 0406  HGB 10.5*  HCT 31.3*  PLT 150    Estimated Creatinine Clearance: 95.7 mL/min (by C-G formula based on Cr of 0.89).   Medical History: Past Medical History  Diagnosis Date  . Alcoholic hepatitis   . Hypertension   . 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 Shortness of breath     new onset- occasionally-at rest and exertion  . GERD (gastroesophageal reflux disease)   . Anxiety   . TIA (transient ischemic attack) 08/2013  . Arthritis     " in my spine "  . Ileus 09/2013    Assessment: 84 YOM admitted on 8/10, for loss of consciousness and tibia fx. No surgical intervention per ortho. Transferred to CIR on 8/12. Found to have a DVT on 8/13. Was on full dose lovenox, now switching to xarelto. Hgb 10.5, plt 150K, scr 1.32 > 0.89, est. crcl ~ 90 ml/min. Last dose lovneox on 8/15 at 1000  Goal of Therapy:  Monitor platelets by anticoagulation protocol: Yes   Plan:  - Xarelto 15 mg po BID x 21 days, then swith to 20 mg daily on 9/6 - Monitor renal function and CBC - Xarelto education with Pharmacist  - Pharmacy sign off, will monitor labs and clinical course peripherally.  Maryanna Shape, PharmD, BCPS  Clinical Pharmacist  Pager: [phone]   02/23/2015,3:14 PM

## 2015-02-23 NOTE — Progress Notes (Signed)
Occupational Therapy Session Note  Patient Details  Name: Tanner [name] MRN: [id number] Date of Birth: 1955-08-10  Today's Date: 02/23/2015 OT Individual Time: 1000-1100 OT Individual Time Calculation (min): 60 min    Short Term Goals: Week 1:  OT Short Term Goal 1 (Week 1): STG=LTG due short anticipated LOS  Skilled Therapeutic Interventions/Progress Updates: ADL-retraining with focus on improved transfers and reinforcement of AE training.   Pt completed shower level ADL session and is able to reach all body parts except right lower leg, back and buttocks while bathing.   OT re-educated pt on use of LH sponge to address deficits with self-care.   Pt reported no need for reacher or sock aid but was unable to reach shoe laces during this session.   Plan to instruct on use of elastic shoe laces during next appointment.    Pt anticipates bathing at sink in his mobile home d/t shallow tub design.   No LOB or endurance deficits observed during this session.   See functional assessment below for details.   Therapy Documentation Precautions:  Precautions Precautions: Fall Required Braces or Orthoses: Knee Immobilizer - Right Knee Immobilizer - Right: On at all times Restrictions Weight Bearing Restrictions: Yes RLE Weight Bearing: Non weight bearing  Pain: Pain Assessment Pain Assessment: 0-10 Pain Score: 3   Function:   Eating Eating               Grooming Oral Care,Brush Teeth, Clean Dentures Activity:      Assist Level: More than reasonable amount of time      Wash, Rinse, Dry Face Activity   Assist Level: More than reasonable time      Wash, Rinse, Dry Hands Activity   Assist Level: More than reasonable time      Brush, Comb Hair Activity   Assist Level: More than reasonable time    Shave Activity Shave activity did not occur: Patient does not shave        Apply Makeup Activity                                                             Bathing Bathing  position   Position: Shower  Bathing parts Body parts bathed by patient: Right arm;Left arm;Chest;Abdomen;Front perineal area;Right upper leg;Left upper leg;Left lower leg Body parts bathed by helper: Buttocks;Back  Bathing assist Assist Level: Touching or steadying assistance(Pt > 75%)       Upper Body Dressing/Undressing Upper body dressing   What is the patient wearing?: Pull over shirt/dress     Pull over shirt/dress - Perfomed by patient: Thread/unthread left sleeve;Thread/unthread right sleeve;Put head through opening;Pull shirt over trunk          Upper body assist Assist Level: More than reasonable time       Lower Body Dressing/Undressing Lower body dressing   What is the patient wearing?: Pants;Socks     Pants- Performed by patient: Thread/unthread right pants leg;Thread/unthread left pants leg;Fasten/unfasten pants       Socks - Performed by patient: Don/doff right sock;Don/doff left sock                Lower body assist         Toileting Toileting          Toileting assist  Bed Mobility Roll left and right activity      Sit to lying activity      Lying to sitting activity      Mobility details     Transfers Sit to stand transfer   Sit to stand assist level: Touching or steadying assistance (Pt > 75%/lift 1 leg) Sit to stand assistive device: Walker;Orthosis (Knee immobilizer)  Chair/bed transfer   Chair/bed transfer method: Stand pivot Chair/bed transfer assist level: Touching or steadying assistance (Pt > 75%) Chair/bed transfer assistive device: Walker   Chair/bed transfer details: Verbal cues for sequencing;Verbal cues for technique;Verbal cues for precautions/safety;Verbal cues for safe use of DME/AE  Toilet transfer                Tub/shower transfer   Tub/shower assistive device: Shower chair;Walk in shower   Assist level into tub: Touching or steadying assistance (Pt > 75%/lift 1 leg) Assist level out of tub:  Touching or steadying assistance (Pt > 75%)   Cognition Comprehension Comprehension assist level: Understands complex 90% of the time/cues 10% of the time  Expression Expression assist level: Expresses complex 90% of the time/cues < 10% of the time  Social Interaction    Problem Solving Problem solving assist level: Solves complex 90% of the time/cues < 10% of the time  Memory Memory assist level: Complete Independence: No helper    Therapy/Group: Individual Therapy  [place] 02/23/2015, 12:43 PM

## 2015-02-23 NOTE — Progress Notes (Signed)
59 y.o. right handed male with history of alcoholism, hypertension, prostate cancer with radical prostatectomy with postop CVA and right sided weakness maintained on aspirin. Patient did receive inpatient rehabilitation services after CVA February 2015. He lives with his family and assistance as needed independent without assistive device prior to admission. Presented 02/18/2015 after a fall trying to open a door with right lower extremity pain. Noted in the ED patient with brief loss of consciousness becoming diaphoretic. EKG normal sinus rhythm. CT of the head showed no acute appearing infarct. X-rays and imaging of right lower extremity showed right proximal tibia fracture. Orthopedic services Dr. Rhona Raider consulted advised no surgical intervention. Placed in a knee brace nonweightbearing for 2 months. Hospital course pain management. Subcutaneous Lovenox for DVT prophylaxis Subjective/Complaints:   Objective: Vital Signs: Blood pressure 164/89, pulse 72, temperature 98.9 F (37.2 C), temperature source Oral, resp. rate 18, height '5\' 10"'$  (1.778 m), weight 81.285 kg (179 lb 3.2 oz), SpO2 97 %. No results found. Results for orders placed or performed during the hospital encounter of 02/20/15 (from the past 72 hour(s))  CBC     Status: Abnormal   Collection Time: 02/22/15  4:06 AM  Result Value Ref Range   WBC 6.0 4.0 - 10.5 K/uL   RBC 3.22 (L) 4.22 - 5.81 MIL/uL   Hemoglobin 10.5 (L) 13.0 - 17.0 g/dL   HCT 31.3 (L) 39.0 - 52.0 %   MCV 97.2 78.0 - 100.0 fL   MCH 32.6 26.0 - 34.0 pg   MCHC 33.5 30.0 - 36.0 g/dL   RDW 12.6 11.5 - 15.5 %   Platelets 150 150 - 400 K/uL     HEENT: normal Cardio: RRR and no murmur Resp: CTA B/L and unlabored GI: BS positive and NT, ND Extremity:  Pulses positive and No Edema Skin:   Intact Neuro: Alert/Oriented and Abnormal Motor 5/5 in BUE, 2- R HF, ANkle DF/PF, in knee immob, Left LE 4+/5 Musc/Skel:  Swelling right leg and foot, non tender no  erythema Gen NAD   Assessment/Plan: 1. 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 Conservative care knee brace at all times nonweightbearing which require 3+ hours per day of interdisciplinary therapy in a comprehensive inpatient rehab setting. Physiatrist is providing close team supervision and 24 hour management of active medical problems listed below. Physiatrist and rehab team continue to assess barriers to discharge/monitor patient progress toward functional and medical goals. FIM:    Function - Lower Body Dressing/Undressing Lower body dressing/undressing activity did not occur: 0: Wears gown/pajamas-no public clothing  Function - Toileting Toileting steps completed by patient: Performs perineal hygiene, Adjust clothing after toileting Toileting Assistive Devices: Grab bar or rail for support  Function - Air cabin crew transfer assistive device: Bedside commode  Function - Chair/bed transfer Chair/bed transfer assistive device: Bed rails, Arm rests     Function - Comprehension Comprehension: Auditory  Function - Expression Expression: Verbal           Medical Problem List and Plan: 1. Functional deficits secondary to right proximal tibia fracture. Conservative care knee brace at all times nonweightbearing 2. DVT Prophylaxis/Anticoagulation: . Monitor for any bleeding episodes.Right post tibial +/- popliteal thrombus, on full dose Lovenox Change to Xarelto for d/c 3. Pain Management: off morphine, now just taking tylenol per pt request   Robaxin 4. Mood/anxiety: Prozac 20 mg daily. Provide emotional support 5. Neuropsych: This patient is capable of making decisions  on his own behalf. 6. Skin/Wound Care: Routine skin checks 7. Fluids/Electrolytes/Nutrition: Routine I&O with follow-up chemistries 8. History of CVA February 2015.mainly RLE residual  Continue aspirin therapy 9.  Hypertension. Lopressor 50 mg twice a day. Monitor with increased mobility 10. History of prostate cancer. Status post radical prostatectomy 11. Hyperlipidemia. Zocor 12. History of alcohol. Now showing signs of  [place] with poor appetite  start Librium, increase to QID 13.  Poor intake 25-80% meals 14.  Anemia-drop of 0.6gm in Hgb, likely bleeding related to fracture , given ETOH hx will check stool guaic, now that on anticoag full dose LOS (Days) 3 A FACE TO [place] E 02/23/2015, 8:04 AM

## 2015-02-23 NOTE — Progress Notes (Signed)
Occupational Therapy Session Note  Patient Details  Name: Tanner [name] MRN: [id number] Date of Birth: 1955-08-22  Today's Date: 02/23/2015 OT Individual Time: 1330-1400 OT Individual Time Calculation (min): 30 min    Short Term Goals: Week 1:  OT Short Term Goal 1 (Week 1): STG=LTG due short anticipated LOS  Skilled Therapeutic Interventions/Progress Updates: ADL-retraining with focus on lower body dressing (shoes) using elastic laces and long shoe horn.   Pt reports that his right foot has been chronically swollen since his prostate surgey and he is no longer able to fit into the shoes he brought from home.   Pt has compensated by purchasing velcro closure shoes for wear indoors only.    Pt accepted elastic laces but demonstrated ability to ben to reach his feet and tie laces unassisted.   Pt was then pushed to gym and completed 2 additional theraband exercises for BUE (bicep curls, seated chest pulls), 10 reps each.   Pt returned to his room and requested transfer to recliner, completing transfer with supervision for safety.    Therapy Documentation Precautions:  Precautions Precautions: Fall Required Braces or Orthoses: Knee Immobilizer - Right Knee Immobilizer - Right: On at all times Restrictions Weight Bearing Restrictions: Yes RLE Weight Bearing: Non weight bearing  Vital Signs: Therapy Vitals Temp: 98.3 F (36.8 C) Temp Source: Oral Pulse Rate: 83 Resp: 18 BP: 117/77 mmHg Patient Position (if appropriate): Sitting Oxygen Therapy SpO2: 97 % O2 Device: Not Delivered   Pain: No/denies pain    Function:   Eating Eating               Grooming Oral Care,Brush Teeth, Clean Dentures Activity:      Assist Level: More than reasonable amount of time      Wash, Rinse, Dry Face Activity   Assist Level: More than reasonable time      Wash, Rinse, Dry Hands Activity   Assist Level: More than reasonable time      Brush, Comb Hair Activity   Assist Level: More  than reasonable time    Shave Activity Shave activity did not occur: Patient does not shave        Apply Makeup Activity                                                             Bathing Bathing position   Position: Shower  Bathing parts Body parts bathed by patient: Right arm;Left arm;Chest;Abdomen;Front perineal area;Right upper leg;Left upper leg;Left lower leg Body parts bathed by helper: Buttocks;Back  Bathing assist Assist Level: Touching or steadying assistance(Pt > 75%)       Upper Body Dressing/Undressing Upper body dressing   What is the patient wearing?: Pull over shirt/dress     Pull over shirt/dress - Perfomed by patient: Thread/unthread left sleeve;Thread/unthread right sleeve;Put head through opening;Pull shirt over trunk          Upper body assist Assist Level: More than reasonable time       Lower Body Dressing/Undressing Lower body dressing   What is the patient wearing?: Pants;Socks     Pants- Performed by patient: Thread/unthread right pants leg;Thread/unthread left pants leg;Fasten/unfasten pants       Socks - Performed by patient: Don/doff right sock;Don/doff left sock  Lower body assist         Toileting Toileting          Toileting assist      Bed Mobility Roll left and right activity      Sit to lying activity      Lying to sitting activity      Mobility details     Transfers Sit to stand transfer   Sit to stand assist level: Touching or steadying assistance (Pt > 75%/lift 1 leg) Sit to stand assistive device: Walker;Orthosis (Knee immobilizer)  Chair/bed transfer   Chair/bed transfer method: Stand pivot Chair/bed transfer assist level: Touching or steadying assistance (Pt > 75%) Chair/bed transfer assistive device: Walker   Chair/bed transfer details: Verbal cues for sequencing;Verbal cues for technique;Verbal cues for precautions/safety;Verbal cues for safe use of DME/AE  Toilet transfer                 Tub/shower transfer   Tub/shower assistive device: Shower chair;Walk in shower   Assist level into tub: Touching or steadying assistance (Pt > 75%/lift 1 leg) Assist level out of tub: Touching or steadying assistance (Pt > 75%)   Cognition Comprehension Comprehension assist level: Understands complex 90% of the time/cues 10% of the time  Expression Expression assist level: Expresses complex 90% of the time/cues < 10% of the time  Social Interaction    Problem Solving Problem solving assist level: Solves complex 90% of the time/cues < 10% of the time  Memory Memory assist level: Complete Independence: No helper    Therapy/Group: Individual Therapy  [place] 02/23/2015, 2:18 PM

## 2015-02-24 ENCOUNTER — Inpatient Hospital Stay (HOSPITAL_COMMUNITY): Payer: Self-pay | Admitting: Physical Therapy

## 2015-02-24 ENCOUNTER — Inpatient Hospital Stay (HOSPITAL_COMMUNITY): Payer: Self-pay

## 2015-02-24 ENCOUNTER — Inpatient Hospital Stay (HOSPITAL_COMMUNITY): Payer: Medicare Other | Admitting: Physical Therapy

## 2015-02-24 DIAGNOSIS — D62 Acute posthemorrhagic anemia: Secondary | ICD-10-CM

## 2015-02-24 DIAGNOSIS — F10230 Alcohol dependence with withdrawal, uncomplicated: Secondary | ICD-10-CM

## 2015-02-24 NOTE — Progress Notes (Signed)
Social Work Patient ID: Gabriel Earing, male   DOB: 03-Aug-1955, 59 y.o.   MRN: [id number]  Lowella Curb, LCSW Social Worker Signed  Patient Care Conference 02/24/2015  2:13 PM    Expand All Collapse All   Inpatient RehabilitationTeam Conference and Plan of Care Update Date: 02/24/2015   Time: 11:30 AM     Patient Name: Tanner [name]       Medical Record Number: [id number]  Date of Birth: May 03, 1956 Sex: Male         Room/Bed: 4M10C/4M10C-01 Payor Info: Payor: MEDICARE / Plan: MEDICARE PART A AND B / Product Type: *No Product type* /    Admitting Diagnosis: R tib fx    Admit Date/Time:  02/20/2015 12:29 AM Admission Comments: No comment available   Primary Diagnosis:  <principal problem not specified> Principal Problem: <principal problem not specified>    Patient Active Problem List     Diagnosis  Date Noted   .  Tibia fracture  02/20/2015   .  Tibial fracture  02/18/2015   .  Syncope  02/18/2015   .  Essential hypertension  02/18/2015   .  History of stroke  02/18/2015   .  Fracture of tibia, right, closed     .  C. difficile colitis  09/11/2013   .  UTI (urinary tract infection)  09/11/2013   .  Essential hypertension, benign  09/11/2013   .  Ileus  09/10/2013   .  Spastic hemiplegia affecting dominant side  09/09/2013   .  Ileus, postoperative  09/09/2013   .  Hypokalemia  09/09/2013   .  Nonspecific (abnormal) findings on radiological and other examination of gastrointestinal tract  09/09/2013   .  CVA (cerebral infarction)  09/05/2013   .  Ataxia of right upper extremity  09/02/2013   .  Malignant neoplasm of prostate  08/29/2013   .  Prostate cancer  05/22/2013   .  Hyponatremia  12/11/2012       Class: Chronic   .  Ankle fracture  12/10/2012     Expected Discharge Date: Expected Discharge Date: 03/02/15  Team Members Present: Physician leading conference: Dr. Alysia Penna Social Worker Present: Lennart Pall, LCSW Nurse Present: Heather Roberts, RN PT Present: [place]ilford Shi, PT OT Present: [name], OT SLP Present: Windell Moulding, SLP PPS Coordinator present : Daiva Nakayama, RN, CRRN        Current Status/Progress  Goal  Weekly Team Focus   Medical     etoh abuse, pain management an issue   Home with home health and int sup  d/c planning   Bowel/Bladder     continent of bowel and bladder. LBM 8/15  Remain continent of bowel and bladder   Offer toileting q2 hours and prn and encourage medications to keep bowels regular.    Swallow/Nutrition/ Hydration               ADL's     Overall Min A for BADL, supervision for transfers   Mod I, Min A for bathroom transfers (plan to upgrade d/t pt report on bathroom size)  Activity tolerance, transfers, pain managment, functional mobility, dynamic standing balance   Mobility     Supervision - min A transfers, min A gait  mod I most mobility, min A stairs  stairs, safety with ambulation and gait distance, maintaining R NWB precautions with all mobility   Communication  Safety/Cognition/ Behavioral Observations    Follows safety precautions and safety plan. Pt calls appropriately   Remain free of falls and injury while on rehab   Encourage use of call bell and safety precautions. Continue hourly rounding.    Pain     Pain to Right leg. Pt uses Tyelenol #3 Q6 hours.   Manage pain at 4 or less on a scale of 0-10.   Assess pain Q 4 hours and prn.    Skin     Skin tear to right elbow. Nonadhesive dressing in place. Applying Neosporin. Healing  Prevent skin breakdown and infection.    Change elbow dressing daily. Use pressure relief methods. Assess skin Q shift and prn.     Rehab Goals Patient on target to meet rehab goals: Yes *See Care Plan and progress notes for long and short-term goals.    Barriers to Discharge:  TDWB status     Possible Resolutions to Barriers:   WC level goals     Discharge Planning/Teaching Needs:   home with only intermittent assist of family        Team Discussion:     Continues to experience some DTs but improved with librium.  Modified ind goals at w/c and short amb goals.  Making good gains overall.  No concerns.   Revisions to Treatment Plan:    NA    Continued Need for Acute Rehabilitation Level of Care: The patient requires daily medical management by a physician with specialized training in physical medicine and rehabilitation for the following conditions: Daily direction of a multidisciplinary physical rehabilitation program to ensure safe treatment while eliciting the highest outcome that is of practical value to the patient.: Yes Daily medical management of patient stability for increased activity during participation in an intensive rehabilitation regime.: Yes Daily analysis of laboratory values and/or radiology reports with any subsequent need for medication adjustment of medical intervention for : Neurological problems;Other  Cleopatra Sardo 02/24/2015, 2:14 PM

## 2015-02-24 NOTE — Progress Notes (Signed)
Occupational Therapy Session Note  Patient Details  Name: Tanner [name] MRN: [id number] Date of Birth: 1956/02/09  Today's Date: 02/24/2015 OT Individual Time: 1100-1130 OT Individual Time Calculation (min): 30 min  and Today's Date: 02/24/2015 OT Missed Time: 30 Minutes Missed Time Reason: Patient fatigue   Short Term Goals: Week 1:  OT Short Term Goal 1 (Week 1): STG=LTG due short anticipated LOS  Skilled Therapeutic Interventions/Progress Updates:    Pt asleep in bed upon arrival and required mod multimodal cues to awaken.  Pt initially disoriented but with time became oriented X 4.  Pt stated he was in a deep sleep and having a dream.  Pt stated he didn't need to bathe or change clothing today because he took a shower the previous day.  Pt initially agreeable to participating in therapy.  Pt performed bed mobility activities and sit<>stand from EOB with supervision.  Pt initially stated he needed to use BSC but changed his mind after standing.  Pt attempted X 4 to take a step but stated he was still "too sore" from yesterday and earlier PT session.  Pt stated he was unable to continue secondary to fatigue.  Focus on participation, bed mobility, activity tolerance, sit<>stand, standing balance, and safety awareness to increased independence with BADLs.  Therapy Documentation Precautions:  Precautions Precautions: Fall Required Braces or Orthoses: Knee Immobilizer - Right Knee Immobilizer - Right: On at all times Restrictions Weight Bearing Restrictions: Yes RLE Weight Bearing: Non weight bearing General: General OT Amount of Missed Time: 30 Minutes  Pain: Pain Assessment Pain Assessment: 0-10 Pain Score: 5  Pain Type: Acute pain Pain Location: Knee Pain Orientation: Right Pain Descriptors / Indicators: Aching Pain Onset: On-going Pain Intervention(s): Rest;Emotional support Multiple Pain Sites: No  Function:                                                     Bed  Mobility Roll left and right activity Roll left and right activity did not occur: N/A    Sit to lying activity   Assist level: Supervision or verbal cues  Lying to sitting activity   Assist level: Supervision or verbal cues  Mobility details Bed mobility details: Verbal cues for techniques;Visual cues/gestures for precautions/safety   Transfers Sit to stand transfer   Sit to stand assist level: Supervision or verbal cues Sit to stand assistive device: Walker;Orthosis               Therapy/Group: Individual Therapy  Leroy Libman 02/24/2015, 11:41 AM

## 2015-02-24 NOTE — Patient Care Conference (Signed)
Inpatient RehabilitationTeam Conference and Plan of Care Update Date: 02/24/2015   Time: 11:30 AM    Patient Name: Tanner [name]      Medical Record Number: [id number]  Date of Birth: [birth year] Sex: Male         Room/Bed: 4M10C/4M10C-01 Payor Info: Payor: MEDICARE / Plan: MEDICARE PART A AND B / Product Type: *No Product type* /    Admitting Diagnosis: R tib fx   Admit Date/Time:  02/20/2015 12:29 AM Admission Comments: No comment available   Primary Diagnosis:  <principal problem not specified> Principal Problem: <principal problem not specified>  Patient Active Problem List   Diagnosis Date Noted  . Tibia fracture 02/20/2015  . Tibial fracture 02/18/2015  . Syncope 02/18/2015  . Essential hypertension 02/18/2015  . History of stroke 02/18/2015  . Fracture of tibia, right, closed   . C. difficile colitis 09/11/2013  . UTI (urinary tract infection) 09/11/2013  . Essential hypertension, benign 09/11/2013  . Ileus 09/10/2013  . Spastic hemiplegia affecting dominant side 09/09/2013  . Ileus, postoperative 09/09/2013  . Hypokalemia 09/09/2013  . Nonspecific (abnormal) findings on radiological and other examination of gastrointestinal tract 09/09/2013  . CVA (cerebral infarction) 09/05/2013  . 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 Malignant neoplasm of prostate 08/29/2013  . Prostate cancer 05/22/2013  . Hyponatremia 12/11/2012    Class: Chronic  . Ankle fracture 12/10/2012    Expected Discharge Date: Expected Discharge Date: 03/02/15  Team Members Present: Physician leading conference: Dr. Alysia [name] Social Worker Present: Tanner Pall, LCSW Nurse Present: Tanner Roberts, RN PT Present: Tanner [name], PT OT Present: Tanner [name], OT SLP Present: Tanner [name], SLP PPS Coordinator present : Tanner Nakayama, RN, CRRN     Current Status/Progress Goal Weekly Team Focus  Medical   etoh abuse, pain management an issue  Home with home health and int sup  d/c planning    Bowel/Bladder   continent of bowel and bladder. LBM 8/15  Remain continent of bowel and bladder  Offer toileting q2 hours and prn and encourage medications to keep bowels regular.    Swallow/Nutrition/ Hydration             ADL's   Overall Min A for BADL, supervision for transfers  Mod I, Min A for bathroom transfers (plan to upgrade d/t pt report on bathroom size)  Activity tolerance, transfers, pain managment, functional mobility, dynamic standing balance   Mobility   Supervision - min A transfers, min A gait  mod I most mobility, min A stairs  stairs, safety with ambulation and gait distance, maintaining R NWB precautions with all mobility   Communication             Safety/Cognition/ Behavioral Observations  Follows safety precautions and safety plan. Pt calls appropriately   Remain free of falls and injury while on rehab   Encourage use of call bell and safety precautions. Continue hourly rounding.    Pain   Pain to Right leg. Pt uses Tyelenol #3 Q6 hours.   Manage pain at 4 or less on a scale of 0-10.   Assess pain Q 4 hours and prn.    Skin   Skin tear to right elbow. Nonadhesive dressing in place. Applying Neosporin. Healing  Prevent skin breakdown and infection.   Change elbow dressing daily. Use pressure relief methods. Assess skin Q shift and prn.     Rehab Goals Patient on target to meet rehab goals: Yes *See Care Plan and progress notes  for long and short-term goals.  Barriers to Discharge: TDWB status    Possible Resolutions to Barriers:  WC level goals    Discharge Planning/Teaching Needs:  home with only intermittent assist of family       Team Discussion:  Continues to experience some DTs but improved with librium.  Modified ind goals at w/c and short amb goals.  Making good gains overall.  No concerns.  Revisions to Treatment Plan:  NA   Continued Need for Acute Rehabilitation Level of Care: The patient requires daily medical management by a physician  with specialized training in physical medicine and rehabilitation for the following conditions: Daily direction of a multidisciplinary physical rehabilitation program to ensure safe treatment while eliciting the highest outcome that is of practical value to the patient.: Yes Daily medical management of patient stability for increased activity during participation in an intensive rehabilitation regime.: Yes Daily analysis of laboratory values and/or radiology reports with any subsequent need for medication adjustment of medical intervention for : Neurological problems;Other  Tanner [name] 02/24/2015, 2:14 PM

## 2015-02-24 NOTE — Progress Notes (Signed)
Social Work Patient ID: Tanner [name], male   DOB: 11/20/1955, 59 y.o.   MRN: [id number]  Met briefly with pt and daughter this afternoon (pt not feeling well due to DTs).  Both aware and agreeable with targeted d/c date of 8/23 with mod ind goals overall.  No concerns expressed.  Continue to follow.  Roslynn Holte, LCSW

## 2015-02-24 NOTE — Progress Notes (Signed)
 Subjective/Complaints: Mild nausea ,no further vomiting + fatigue  Objective: Vital Signs: Blood pressure 158/82, pulse 76, temperature 99.1 F (37.3 C), temperature source Oral, resp. rate 18, height 5' 10" (1.778 m), weight 81.285 kg (179 lb 3.2 oz), SpO2 97 %. No results found. Results for orders placed or performed during the hospital encounter of 02/20/15 (from the past 72 hour(s))  CBC     Status: Abnormal   Collection Time: 02/22/15  4:06 AM  Result Value Ref Range   WBC 6.0 4.0 - 10.5 K/uL   RBC 3.22 (L) 4.22 - 5.81 MIL/uL   Hemoglobin 10.5 (L) 13.0 - 17.0 g/dL   HCT 31.3 (L) 39.0 - 52.0 %   MCV 97.2 78.0 - 100.0 fL   MCH 32.6 26.0 - 34.0 pg   MCHC 33.5 30.0 - 36.0 g/dL   RDW 12.6 11.5 - 15.5 %   Platelets 150 150 - 400 K/uL     HEENT: normal Cardio: RRR and no murmur Resp: CTA B/L and unlabored GI: BS positive and NT, ND Extremity:  Pulses positive and No Edema LLE, R calf edemaSkin:   Intact Neuro: Alert/Oriented and Abnormal Motor 5/5 in BUE, 2- R HF, ANkle DF/PF, in knee immob, Left LE 4+/5 Musc/Skel:  Swelling right leg and foot, non tender no erythema Gen NAD   Assessment/Plan: 1. 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 Conservative care knee brace at all times nonweightbearing which require 3+ hours per day of interdisciplinary therapy in a comprehensive inpatient rehab setting. Physiatrist is providing close team supervision and 24 hour management of active medical problems listed below. Physiatrist and rehab team continue to assess barriers to discharge/monitor patient progress toward functional and medical goals.  Team conference today please see physician documentation under team conference tab, met with team face-to-face to discuss problems,progress, and goals. Formulized individual treatment plan based on medical history, underlying problem and comorbidities. FIM: Function -  Bathing Position: Shower Body parts bathed by patient: Right arm, Left arm, Chest, Abdomen, Front perineal area, Right upper leg, Left upper leg, Left lower leg Body parts bathed by helper: Buttocks, Back Bathing not applicable: Right lower leg Assist Level: Touching or steadying assistance(Pt > 75%)  Function- Upper Body Dressing/Undressing What is the patient wearing?: Pull over shirt/dress Pull over shirt/dress - Perfomed by patient: Thread/unthread left sleeve, Thread/unthread right sleeve, Put head through opening, Pull shirt over trunk Assist Level: More than reasonable time Function - Lower Body Dressing/Undressing Lower body dressing/undressing activity did not occur: 0: Wears gown/pajamas-no public clothing What is the patient wearing?: Pants, Socks Position: Wheelchair/chair at sink Pants- Performed by patient: Thread/unthread right pants leg, Thread/unthread left pants leg, Fasten/unfasten pants Socks - Performed by patient: Don/doff right sock, Don/doff left sock  Function - Toileting Toileting steps completed by patient: Performs perineal hygiene, Adjust clothing after toileting Toileting steps completed by helper: Adjust clothing prior to toileting Toileting Assistive Devices: Grab bar or rail Assist level: Supervision or verbal cues  Function - Toilet Transfers Toilet transfer assistive device: Bedside commode  Function - Chair/bed transfer Chair/bed transfer method: Stand pivot Chair/bed transfer assist level: Touching or steadying assistance (Pt > 75%) Chair/bed transfer assistive device: Walker Chair/bed transfer details: Verbal cues for sequencing, Verbal cues for technique, Verbal cues for precautions/safety, Verbal cues for safe use of DME/AE  Function - Locomotion: Wheelchair Will patient use wheelchair at discharge?: Yes Type: Manual Max wheelchair distance: 150 Assist   Level: Supervision or verbal cues Assist Level: Supervision or verbal cues Assist  Level: Supervision or verbal cues  Function - Comprehension Comprehension: Auditory Comprehension assist level: Understands complex 90% of the time/cues 10% of the time  Function - Expression Expression: Verbal Expression assist level: Expresses complex 90% of the time/cues < 10% of the time  Function - Social Interaction Social Interaction assist level: Interacts appropriately with others with medication or extra time (anti-anxiety, antidepressant).  Function - Problem Solving Problem solving assist level: Solves complex 90% of the time/cues < 10% of the time  Function - Memory Memory assist level: Complete Independence: No helper  Medical Problem List and Plan: 1. Functional deficits secondary to right proximal tibia fracture. Conservative care knee brace at all times nonweightbearing 2. DVT Prophylaxis/Anticoagulation: . Monitor for any bleeding episodes.Right post tibial +/- popliteal thrombus, on full dose Lovenox Change to Xarelto for d/c 3. Pain Management: off morphine, now just taking tylenol per pt request   Robaxin 4. Mood/anxiety: Prozac 20 mg daily. Provide emotional support 5. Neuropsych: This patient is capable of making decisions on his own behalf. 6. Skin/Wound Care: Routine skin checks 7. Fluids/Electrolytes/Nutrition: Routine I&O with follow-up chemistries 8. History of CVA February 2015.mainly RLE residual  Continue aspirin therapy 9. Hypertension. Lopressor 50 mg twice a day. Monitor with increased mobility 10. History of prostate cancer. Status post radical prostatectomy 11. Hyperlipidemia. Zocor 12. History of alcohol. Now showing signs of  [place] with poor appetite  start Librium, increased to QID, fatigue, will reduce to TID 13.  Poor intake 70-100% meals 14.  Anemia-drop of 0.6gm in Hgb, likely bleeding related to fracture , given ETOH hx will await stool guaic, now that on anticoag full dose LOS (Days) 4 A FACE TO FACE EVALUATION WAS  PERFORMED  KIRSTEINS,ANDREW E 02/24/2015, 9:31 AM

## 2015-02-24 NOTE — Progress Notes (Signed)
Physical Therapy Session Note  Patient Details  Name: Tanner [name] MRN: [id number] Date of Birth: 1955/11/06  Today's Date: 02/24/2015 PT Individual Time: 0900-1015 Treatment Session 2: 1345-1500 scheduled PT Individual Time Calculation (min): 75 min  Treatment Session 2: 75 missed minutes  Short Term Goals: Week 1:  PT Short Term Goal 1 (Week 1): STG=LTG due to LOS, Mod I transfers and short distance gait with RW  Skilled Therapeutic Interventions/Progress Updates:    Treatment Session 1: Pt received in bed, difficult to maintain awake/alert state, but once on eob, participates well during PT session. Gait Training - see details below - pt req CGA, progressing to min A as L leg fatigues and pt stubbs his foot during swing phase - req a seated rest break. W/C Propulsion - see details below: 130' + 185', including up/down a low ramp - focus on endurance and safety. Therapeutic Activity - see details below - pt req increased time to self manage R LE with both hands.   PT requested that pt have daughter measure doorway widths and distance between stair rails in order to ensure appropriate size w/c is ordered and in order to practice ascending/descending stairs in manner similar to home set up. Pt agrees to do so.   Treatment Session 2: Pt received in bed with daughter Aldona Bar present. Pt sweating profusely and reporting he feels poor/sick. PT took pt's vitals - see flow sheet - all wfl. PT spoke with RN who reports pt is continuing to experience EtOH withdrawal symptoms and agrees to give him some medication. Pt reports he is unable to participate in afternoon PT session. PT agrees to check on pt tomorrow. 75 minutes missed. Reassess pt ability to participate on 8/18.    Therapy Documentation Precautions:  Precautions Precautions: Fall Required Braces or Orthoses: Knee Immobilizer - Right Knee Immobilizer - Right: On at all times Restrictions Weight Bearing Restrictions: Yes RLE Weight  Bearing: Non weight bearing General: Treatment Session 2 PT Amount of Missed Time (min): 75 Minutes PT Missed Treatment Reason: Patient ill (Comment) (EtOH withdrawal symptoms - unable to participate) Vital Signs: See Flow Sheet  Pain: Pain Assessment Pain Assessment: 0-10 Pain Score: 5  Pain Type: Acute pain Pain Location: Knee Pain Orientation: Right Pain Descriptors / Indicators: Aching Pain Onset: On-going Pain Intervention(s): Rest;Emotional support Multiple Pain Sites: No Treatment Session 2:   Function:  Toileting Toileting Toileting activity did not occur: N/A     Bed Mobility Roll left and right activity Roll left and right activity did not occur: N/A    Sit to lying activity   Assist level: Supervision or verbal cues  Lying to sitting activity   Assist level: Supervision or verbal cues  Mobility details Bed mobility details: Verbal cues for techniques;Visual cues/gestures for precautions/safety   Transfers Sit to stand transfer   Sit to stand assist level: Supervision or verbal cues Sit to stand assistive device: Walker;Orthosis  Chair/bed transfer   Chair/bed transfer method: Stand pivot Chair/bed transfer assist level: Touching or steadying assistance (Pt > 75%) Chair/bed transfer assistive device: Walker;Orthosis   Chair/bed transfer details: Manual facilitation for placement;Verbal cues for Research officer, political party transfer assistive device: Publishing rights manager transfer assist level: Touching or steadying assistance (Pt > 75%, lift 1 leg)    Locomotion Ambulation   Assistive device: Walker-rolling Max distance: 19 Assist level: Touching or steadying assistance (Pt > 75%)  Walk 10 feet  activity   Assist level: Touching or steadying assistance (Pt > 75%)  Walk 50 feet with 2 turns activity Walk 50 feet with 2 turns activity did not occur: Safety/medical concerns    Walk 150 feet activity Walk 150 feet activity did not  occur: Safety/medical concerns    Walk 10 feet on uneven surfaces activity Walk 10 feet on uneven surfaces activity did not occur: Safety/medical concerns    Stairs   Stairs assistive device: 2 hand rails Max number of stairs: 4 Stairs assist level: Touching or steadying assistance (Pt > 75%)  Walk up/down 1 step activity Walk up/down 1 step or curb (drop down) activity did not occur: N/A      Walk up/down 4 steps activity   Walk up/down 4 steps assist level: Touching or steadying assistance (Pt > 75%)  Walk up/down 12 steps activity Walk up/down 12 steps activity did not occur: Safety/medical concerns    Pick up small objects from floor Pick up small object from the floor (from standing position) activity did not occur: Safety/medical concerns    Wheelchair   Type: Manual Max wheelchair distance: 185 Assist Level: No help, No cues, assistive device, takes more than reasonable amount of time  Wheel 50 feet with 2 turns activity   Assist Level: No help, No cues, assistive device, takes more than reasonable amount of time  Wheel 150 feet activity   Assist Level: No help, No cues, assistive device, takes more than reasonable amount of time   Cognition Comprehension Comprehension assist level: Understands complex 90% of the time/cues 10% of the time  Expression Expression assist level: Expresses complex 90% of the time/cues < 10% of the time  Social Interaction Social Interaction assist level: Interacts appropriately with others with medication or extra time (anti-anxiety, antidepressant).  Problem Solving Problem solving assist level: Solves complex 90% of the time/cues < 10% of the time  Memory Memory assist level: Recognizes or recalls 90% of the time/requires cueing < 10% of the time    Therapy/Group: Individual Therapy  Veer Elamin M 02/24/2015, 11:21 AM

## 2015-02-25 ENCOUNTER — Inpatient Hospital Stay (HOSPITAL_COMMUNITY): Payer: Medicare Other

## 2015-02-25 ENCOUNTER — Inpatient Hospital Stay (HOSPITAL_COMMUNITY): Payer: Self-pay | Admitting: Physical Therapy

## 2015-02-25 ENCOUNTER — Inpatient Hospital Stay (HOSPITAL_COMMUNITY): Payer: Medicare Other | Admitting: Physical Therapy

## 2015-02-25 LAB — BASIC METABOLIC PANEL
Anion gap: 8 (ref 5–15)
BUN: 14 mg/dL (ref 6–20)
CHLORIDE: 103 mmol/L (ref 101–111)
CO2: 28 mmol/L (ref 22–32)
CREATININE: 1.02 mg/dL (ref 0.61–1.24)
Calcium: 9.2 mg/dL (ref 8.9–10.3)
GFR calc Af Amer: >60 mL/min (ref >60)
GFR calc non Af Amer: >60 mL/min (ref >60)
GLUCOSE: 93 mg/dL (ref 65–99)
Potassium: 3.7 mmol/L (ref 3.5–5.1)
Sodium: 139 mmol/L (ref 135–145)

## 2015-02-25 LAB — CBC
HEMATOCRIT: 38.3 % — AB (ref 39.0–52.0)
HEMOGLOBIN: 12.5 g/dL — AB (ref 13.0–17.0)
MCH: 32.6 pg (ref 26.0–34.0)
MCHC: 32.6 g/dL (ref 30.0–36.0)
MCV: 100 fL (ref 78.0–100.0)
Platelets: 189 10*3/uL (ref 150–400)
RBC: 3.83 MIL/uL — ABNORMAL LOW (ref 4.22–5.81)
RDW: 12.7 % (ref 11.5–15.5)
WBC: 6.8 10*3/uL (ref 4.0–10.5)

## 2015-02-25 MED ORDER — CLONAZEPAM 0.5 MG PO TABS
0.5000 mg | ORAL_TABLET | Freq: Three times a day (TID) | ORAL | Status: DC | PRN
  Administered 2015-02-25 – 2015-02-28 (×4): 0.5 mg via ORAL
  Filled 2015-02-25 (×5): qty 1

## 2015-02-25 MED ORDER — MUSCLE RUB 10-15 % EX CREA
TOPICAL_CREAM | Freq: Three times a day (TID) | CUTANEOUS | Status: DC
  Administered 2015-02-25: 22:00:00 via TOPICAL
  Filled 2015-02-25: qty 85

## 2015-02-25 MED ORDER — SODIUM CHLORIDE 0.9 % IV SOLN
INTRAVENOUS | Status: DC
  Administered 2015-02-25 (×2): via INTRAVENOUS

## 2015-02-25 MED ORDER — DIPHENHYDRAMINE HCL 25 MG PO CAPS
25.0000 mg | ORAL_CAPSULE | Freq: Four times a day (QID) | ORAL | Status: DC | PRN
  Administered 2015-02-25 – 2015-02-28 (×4): 25 mg via ORAL
  Filled 2015-02-25 (×4): qty 1

## 2015-02-25 MED ORDER — TROLAMINE SALICYLATE 10 % EX CREA
TOPICAL_CREAM | Freq: Three times a day (TID) | CUTANEOUS | Status: DC
  Filled 2015-02-25: qty 85

## 2015-02-25 MED ORDER — LORAZEPAM 0.5 MG PO TABS
1.0000 mg | ORAL_TABLET | Freq: Four times a day (QID) | ORAL | Status: DC | PRN
  Administered 2015-02-25: 1 mg via ORAL
  Filled 2015-02-25: qty 2

## 2015-02-25 MED ORDER — LORAZEPAM 2 MG/ML IJ SOLN: 1.0000 mg | Freq: Four times a day (QID) | INTRAMUSCULAR | Status: DC | PRN

## 2015-02-25 NOTE — Plan of Care (Signed)
Problem: RH BOWEL ELIMINATION Goal: RH STG MANAGE BOWEL WITH ASSISTANCE STG Manage Bowel with min Assistance.  Outcome: Not Progressing Required sorbitol for bowel movement

## 2015-02-25 NOTE — Progress Notes (Signed)
Subjective/Complaints: Mild nausea ,no further vomiting, poor intake Decreased urination, concentrated urine in bottle + fatigue  Objective: Vital Signs: Blood pressure 152/80, pulse 70, temperature 98.4 F (36.9 C), temperature source Oral, resp. rate 18, height '5\' 10"'$  (1.778 m), weight 81.45 kg (179 lb 9 oz), SpO2 97 %. No results found. No results found for this or any previous visit (from the past 72 hour(s)).   HEENT: normal Cardio: RRR and no murmur Resp: CTA B/L and unlabored GI: BS positive and NT, ND Extremity:  Pulses positive and No Edema LLE, R calf edemaSkin:   Intact Neuro: Alert/Oriented and Abnormal Motor 5/5 in BUE, 2- R HF, ANkle DF/PF, in knee immob, Left LE 4+/5 Musc/Skel:  Swelling right leg and foot, non tender no erythema Gen NAD Mood/affect no lability or agitation  Assessment/Plan: 1. 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 Conservative care knee brace at all times nonweightbearing which require 3+ hours per day of interdisciplinary therapy in a comprehensive inpatient rehab setting. Physiatrist is providing close team supervision and 24 hour management of active medical problems listed below. Physiatrist and rehab team continue to assess barriers to discharge/monitor patient progress toward functional and medical goals.   FIM: Function - Bathing Bathing activity did not occur: N/A Position: Shower Body parts bathed by patient: Right arm, Left arm, Chest, Abdomen, Front perineal area, Right upper leg, Left upper leg, Left lower leg Body parts bathed by helper: Buttocks, Back Bathing not applicable: Right lower leg Assist Level: Touching or steadying assistance(Pt > 75%)  Function- Upper Body Dressing/Undressing Upper body dressing/undressing activity did not occur: N/A What is the patient wearing?: Pull over shirt/dress Pull over shirt/dress - Perfomed by patient:  Thread/unthread left sleeve, Thread/unthread right sleeve, Put head through opening, Pull shirt over trunk Assist Level: More than reasonable time Function - Lower Body Dressing/Undressing Lower body dressing/undressing activity did not occur: 0: Wears gown/pajamas-no public clothing What is the patient wearing?: Shoes, Pants, Ted Hose Position: Sitting EOB Pants- Performed by patient: Thread/unthread right pants leg, Thread/unthread left pants leg, Fasten/unfasten pants Socks - Performed by patient: Don/doff right sock, Don/doff left sock Shoes - Performed by patient: Don/doff left shoe, Fasten left TED Hose - Performed by helper: Don/doff left TED hose Assist Level: Set up Set up : Don/doff TED stockings, To obtain clothing/put away  Function - Toileting Toileting activity did not occur: N/A Toileting steps completed by patient: Performs perineal hygiene, Adjust clothing after toileting Toileting steps completed by helper: Adjust clothing prior to toileting Toileting Assistive Devices: Grab bar or rail Assist level: Supervision or verbal cues  Function Midwife transfer assistive device: Bedside commode  Function - Chair/bed transfer Chair/bed transfer method: Stand pivot Chair/bed transfer assist level: Touching or steadying assistance (Pt > 75%) Chair/bed transfer assistive device: Walker, Orthosis Chair/bed transfer details: Manual facilitation for placement, Verbal cues for precautions/safety  Function - Locomotion: Wheelchair Will patient use wheelchair at discharge?: Yes Type: Manual Max wheelchair distance: 185 Assist Level: No help, No cues, assistive device, takes more than reasonable amount of time Assist Level: No help, No cues, assistive device, takes more than reasonable amount of time Assist Level: No help, No cues, assistive device, takes more than reasonable amount of time Function - Locomotion: Ambulation Max distance: 19 Assist level:  Touching or steadying assistance (Pt > 75%) Assist level: Touching or steadying assistance (Pt > 75%) Walk 50 feet with 2  turns activity did not occur: Safety/medical concerns Walk 150 feet activity did not occur: Safety/medical concerns Walk 10 feet on uneven surfaces activity did not occur: Safety/medical concerns  Function - Comprehension Comprehension: Auditory Comprehension assist level: Understands complex 90% of the time/cues 10% of the time  Function - Expression Expression: Verbal Expression assist level: Expresses complex 90% of the time/cues < 10% of the time  Function - Social Interaction Social Interaction assist level: Interacts appropriately with others with medication or extra time (anti-anxiety, antidepressant).  Function - Problem Solving Problem solving assist level: Solves complex 90% of the time/cues < 10% of the time  Function - Memory Memory assist level: Recognizes or recalls 90% of the time/requires cueing < 10% of the time  Medical Problem List and Plan: 1. Functional deficits secondary to right proximal tibia fracture. Conservative care knee brace at all times nonweightbearing 2. DVT Prophylaxis/Anticoagulation: . Monitor for any bleeding episodes.Right post tibial +/- popliteal thrombus, on full dose Lovenox Change to Xarelto for d/c 3. Pain Management: off morphine, now just taking tylenol per pt request   Robaxin 4. Mood/anxiety: Prozac 20 mg daily. Provide emotional support 5. Neuropsych: This patient is capable of making decisions on his own behalf. 6. Skin/Wound Care: Routine skin checks 7. Fluids/Electrolytes/Nutrition: Routine I&O with follow-up chemistries 8. History of CVA February 2015.mainly RLE residual  Continue aspirin therapy 9. Hypertension. Lopressor 50 mg twice a day. Monitor with increased mobility 10. History of prostate cancer. Status post radical prostatectomy 11. Hyperlipidemia. Zocor 12. History of alcohol. Now showing signs of   [place] protocol 13.  Poor intake 70-100% meals 14.  Anemia-drop of 0.6gm in Hgb, likely bleeding related to fracture , given ETOH hx will await stool guaic, now that on anticoag full dose LOS (Days) 5 A FACE TO FACE EVALUATION WAS PERFORMED  KIRSTEINS,ANDREW E 02/25/2015, 7:26 AM

## 2015-02-25 NOTE — Progress Notes (Signed)
Physical Therapy Session Note  Patient Details  Name: Tanner [name] MRN: [id number] Date of Birth: Mar 18, 1956  Today's Date: 02/25/2015 PT Individual Time: 1300-1400 PT Individual Time Calculation (min): 60 min   Short Term Goals: Week 1:  PT Short Term Goal 1 (Week 1): STG=LTG due to LOS, Mod I transfers and short distance gait with RW  Skilled Therapeutic Interventions/Progress Updates:    Pt received up in recliner, R knee immobilizer in place, and daughter present observing throughout PT session. W/C Management - pt completes w/c management in controlled environment mod I with B UEs and L LE prn. Pt demonstrates ability to safely doff R elevating legrest and L swing away legrest. PT also exchanges pt's 20x18 manual w/c for 18x18 manual w/c and pt much more satisfied with this fit. Daughter agrees to measure door width at home tonight and report back. Rehab 18x18 w/c is just under 26" wide push rim to push rim. Gait Training: See below for details - PT exchanges pt's wide double wheel front RW for a standard RW as this is what pt has at home - no LOB req assist to correct. Pt's daughter has not yet measured distance between stair rails, but agrees to do this at home tonight and report back. Pt is progressing with ambulation distance and in managing stairs - today practicing with one rail and one underarm crutch. PT specifically explained to daughter positioning she should be in, including hand placement, in order to safely assist pt up & down stairs - daughter verbalizes understanding of positioning, but needs to have hands on practice. Therapeutic Exercise - PT instructs pt in R AROM exercises for strengthening and edema reduction: partial SLR from lowered elevated legrest, ankle circles clockwise & counterclockwise: 2 x 10 reps each.   Pt reports feeling better today compared to yesterday and works hard throughout PT session. Pt is continuing to progress and daughter observes entire session.  Daughter to receive hands on family training when pt is closer to d/c and stronger. Continue per PT POC.   Therapy Documentation Precautions:  Precautions Precautions: Fall Required Braces or Orthoses: Knee Immobilizer - Right Knee Immobilizer - Right: On at all times Restrictions Weight Bearing Restrictions: Yes RLE Weight Bearing: Non weight bearing Pain: Pain Assessment Pain Assessment: 0-10 Pain Score: 8  Pain Location: Knee Pain Orientation: Right   Function:  Toileting Toileting       Bed Mobility Roll left and right activity Roll left and right activity did not occur:  (pt received in w/c and left sitting in recliner)    Sit to lying activity      Lying to sitting activity      Mobility details     Transfers Sit to stand transfer   Sit to stand assist level: Supervision or verbal cues Sit to stand assistive device: Walker;Orthosis;Armrests (R knee immobilizer)  Chair/bed transfer   Chair/bed transfer method: Stand pivot Chair/bed transfer assist level: Supervision or verbal cues Chair/bed transfer assistive device: Armrests;Walker;Orthosis   Chair/bed transfer details: Verbal cues for Nurse, mental health     Max distance: 85 Assist level: Touching or steadying assistance (Pt > 75%)  Walk 10 feet activity   Assist level: Touching or steadying assistance (Pt > 75%)  Walk 50 feet with 2 turns activity   Assist level: Touching or steadying assistance (Pt > 75%)  Walk  150 feet activity      Walk 10 feet on uneven surfaces activity      Stairs   Stairs assistive device: 1 hand rail;Other (comment) (L rail and R underarm crutch) Max number of stairs: 4 Stairs assist level: Touching or steadying assistance (Pt > 75%)  Walk up/down 1 step activity     Walk up/down 1 step (curb) assist level: Touching or steadying assistance (Pt > 75%)  Walk up/down 4 steps activity   Walk up/down 4 steps  assist level: Touching or steadying assistance (Pt > 75%)  Walk up/down 12 steps activity      Pick up small objects from floor      Wheelchair   Type: Manual Max wheelchair distance: 200 Assist Level: No help, No cues, assistive device, takes more than reasonable amount of time  Wheel 50 feet with 2 turns activity   Assist Level: No help, No cues, assistive device, takes more than reasonable amount of time  Wheel 150 feet activity   Assist Level: No help, No cues, assistive device, takes more than reasonable amount of time   Cognition Comprehension Comprehension assist level: Understands complex 90% of the time/cues 10% of the time  Expression Expression assist level: Expresses complex 90% of the time/cues < 10% of the time  Social Interaction Social Interaction assist level: Interacts appropriately 90% of the time - Needs monitoring or encouragement for participation or interaction.  Problem Solving Problem solving assist level: Solves basic 90% of the time/requires cueing < 10% of the time  Memory Memory assist level: Recognizes or recalls 90% of the time/requires cueing < 10% of the time    Therapy/Group: Individual Therapy  Dane Kopke M 02/25/2015, 1:41 PM

## 2015-02-25 NOTE — Progress Notes (Signed)
Physical Therapy Session Note  Patient Details  Name: Tanner [name] MRN: [id number] Date of Birth: 01-10-1956  Today's Date: 02/25/2015 PT Individual Time: 0900-1000 PT Individual Time Calculation (min): 60 min   Short Term Goals: Week 1:  PT Short Term Goal 1 (Week 1): STG=LTG due to LOS, Mod I transfers and short distance gait with RW  Skilled Therapeutic Interventions/Progress Updates:    Therapeutic Exercise: PT instructed patient in LE ther-ex 2x10 reps for ankle pumps, quad sets and glute sets. PT instructed patient in RUE wrist extensor stretch 3 x30 seconds.    Gait Training: PT instructed patient in ambulation with RW x60' with steady A and verbal cues for trap/lat activation during L swing phase. PT instructed patient in 2" step negotiation with RW 5 steps x2 sets focusing on forward weight shift, maintaining RLE NWB, and use of LLE/minimizing use of UEs for ascending steps.   W/C: Pt propels w/c x100' and x200' with UEs and LLE mod I.   Therapy Documentation Precautions:  Precautions Precautions: Fall Required Braces or Orthoses: Knee Immobilizer - Right Knee Immobilizer - Right: On at all times Restrictions Weight Bearing Restrictions: Yes RLE Weight Bearing: Non weight bearing  Pain: Pain Assessment Pain Assessment: 0-10 Pain Score: 8  Pain Location: Knee Pain Orientation: Right  Function:  Toileting Toileting       Bed Mobility Roll left and right activity Roll left and right activity did not occur:  (pt received in w/c and left sitting in recliner)    Sit to lying activity      Lying to sitting activity      Mobility details     Transfers Sit to stand transfer   Sit to stand assist level: Supervision or verbal cues Sit to stand assistive device: Walker;Orthosis (KI to RLE)  Chair/bed transfer               Marine scientist     Max distance: 60 Assist level: Touching or steadying  assistance (Pt > 75%)  Walk 10 feet activity   Assist level: Touching or steadying assistance (Pt > 75%)  Walk 50 feet with 2 turns activity   Assist level: Touching or steadying assistance (Pt > 75%)  Walk 150 feet activity      Walk 10 feet on uneven surfaces activity      Stairs          Walk up/down 1 step activity     Walk up/down 1 step (curb) assist level: Touching or steadying assistance (Pt > 75%) (5 reps x2 sets)  Walk up/down 4 steps activity      Walk up/down 12 steps activity      Pick up small objects from floor      Wheelchair     Max wheelchair distance: 200 Assist Level: No help, No cues, assistive device, takes more than reasonable amount of time  Wheel 50 feet with 2 turns activity   Assist Level: No help, No cues, assistive device, takes more than reasonable amount of time  Wheel 150 feet activity   Assist Level: No help, No cues, assistive device, takes more than reasonable amount of time      Therapy/Group: Individual Therapy  Tanner [name] 02/25/2015, 12:30 PM

## 2015-02-25 NOTE — Progress Notes (Signed)
Occupational Therapy Session Note  Patient Details  Name: Tanner [name] MRN: [id number] Date of Birth: 1955-12-24  Today's Date: 02/25/2015 OT Individual Time: 5462-7035 OT Individual Time Calculation (min): 60 min    Short Term Goals: Week 1:  OT Short Term Goal 1 (Week 1): STG=LTG due short anticipated LOS  Skilled Therapeutic Interventions/Progress Updates: Pt received supine in bed and reporting fatigue d/t poor sleep.   OT provided instruction regarding skin care and orthotics donning/doffing.    Pt required overall max assist to wash RLE while supine with knee maintained at full extension.    Pt educated on use of leg lifter and re-educated on sequence and positioning of knee immobilizer.      Pt washed his periarea while supine with only setup need to provide clean washcloths and towel.   Pt required max assist to wash buttocks and don lower body clothing while supine.   See functional assessment below for pt progress with BADL, mildly declined during this session d/t instruction on self-care while supine. Pt exited bed and remained in w/c at end of session with all needs within reach.     Therapy Documentation Precautions:  Precautions Precautions: Fall Required Braces or Orthoses: Knee Immobilizer - Right Knee Immobilizer - Right: On at all times Restrictions Weight Bearing Restrictions: Yes RLE Weight Bearing: Non weight bearing  Pain: Pain Assessment Pain Assessment: 0-10 Pain Score: 8  Pain Location: Knee Pain Orientation: Right;Posterior Pain Onset: On-going Patients Stated Pain Goal: 6 Pain Intervention(s): Medication (See eMAR) Function:   Eating Eating   Eating Assist Level: No help, No cues           Grooming Oral Care,Brush Teeth, Clean Dentures Activity:             Wash, Rinse, Dry Face Activity          Wash, Rinse, Dry Hands Activity          Brush, Comb Hair Activity        Shave Activity          Apply Makeup Activity                                                              Bathing Bathing position   Position: Bed  Bathing parts Body parts bathed by patient: Front perineal area Body parts bathed by helper: Buttocks;Right upper leg;Left upper leg  Bathing assist Assist Level: Touching or steadying assistance(Pt > 75%)       Upper Body Dressing/Undressing Upper body dressing                    Upper body assist         Lower Body Dressing/Undressing Lower body dressing                                  Lower body assist         Toileting Toileting   Toileting steps completed by patient: Adjust clothing prior to toileting;Adjust clothing after toileting;Performs perineal hygiene      Toileting assist Assist level: Set up/obtain supplies    Bed Mobility Roll left and right activity Roll left and right activity did not occur:  (pt  received in w/c and left sitting in recliner) Assist level: Supervision or verbal cues  Sit to lying activity   Assist level: Supervision or verbal cues  Lying to sitting activity      Mobility details     Transfers Sit to stand transfer   Sit to stand assist level: Supervision or verbal cues Sit to stand assistive device: Walker  Chair/bed Press photographer transfer assistive device: Bedside commode;Walker Assist level to bedside commode (at bedside): Touching or steadying assistance (Pt > 75%) Assist level from bedside commode (at bedside): Touching or steadying assistance (Pt > 75%)        Tub/shower transfer             Cognition Comprehension    Expression    Social Interaction    Problem Solving    Memory      Therapy/Group: Individual Therapy   Second session: Time: 4008-6761 Time Calculation (min):  35 min  Pain Assessment: No/denies pain  Skilled Therapeutic Interventions: Therapeutic exercise with focus on strengthening of BUE to enable improved functional mobility using RW.    Pt performed all 7 exercises seated: Chest Press, Row, Scapular Chest Pull, Scapular Pull Downs, Biceps Curl, Triceps Press Down, Triceps Press Forward, with his daughter supervising using written HEP previously issued and orange theraband, 10 reps each exercise.   During HEP, pt and daughter were consulted on pt's deficits with BADL, namely toilet hygiene and washing his buttocks during bathing.   Daughter confirmed her ability to perform tasks as needed, without reservations.      See FIM for current functional status  Therapy/Group: Individual Therapy  [place] 02/25/2015, 11:43 AM

## 2015-02-26 ENCOUNTER — Inpatient Hospital Stay (HOSPITAL_COMMUNITY): Payer: Medicare Other | Admitting: Occupational Therapy

## 2015-02-26 ENCOUNTER — Inpatient Hospital Stay (HOSPITAL_COMMUNITY): Payer: Medicare Other | Admitting: Physical Therapy

## 2015-02-26 ENCOUNTER — Inpatient Hospital Stay (HOSPITAL_COMMUNITY): Payer: Medicare Other

## 2015-02-26 DIAGNOSIS — S82201D Unspecified fracture of shaft of right tibia, subsequent encounter for closed fracture with routine healing: Secondary | ICD-10-CM

## 2015-02-26 MED ORDER — TRAMADOL HCL 50 MG PO TABS
50.0000 mg | ORAL_TABLET | Freq: Four times a day (QID) | ORAL | Status: DC
  Administered 2015-02-26 – 2015-03-01 (×10): 50 mg via ORAL
  Filled 2015-02-26 (×11): qty 1

## 2015-02-26 NOTE — Plan of Care (Signed)
Problem: RH PAIN MANAGEMENT Goal: RH STG PAIN MANAGED AT OR BELOW PT'S PAIN GOAL Assess pain q shift and PRN pain goal of 5  Outcome: Not Progressing Added ultram to scheduled medications for pain

## 2015-02-26 NOTE — Progress Notes (Signed)
Occupational Therapy Session Note  Patient Details  Name: Tanner [name] MRN: [id number] Date of Birth: [birth year]  Today's Date: 02/26/2015 OT Individual Time: 1300-1414 OT Individual Time Calculation (min): 74 min   Skilled Therapeutic Interventions/Progress Updates:    Pt performed supine to sit with supervision to begin session.  He was able to donn his left shoe with use of a LH shoe horn.  Gripper sock remained on the RLE.  Pt reports right forearm pain with heavy weightbearing but throughout session with use of the RW he stated it was only minimal.  He propelled his wheelchair down to the therapy gym with modified independence and worked on functional mobility and standing balance with use of the RW.  Had pt toss horse shoes while standing and using the RUE and then retrieve them with use of the reacher.  He was able to maintain NWBing in the RLE during mobility to retrieve the horseshoes as well as when standing to toss them.  He required rest breaks after 4-5 mins of standing.  Performed 4 intervals of this.  Provided walker and reacher bag for use at home and during activity.  Pt returned to room via wheelchair at end of session and then back to bed stand pivot with the RW.  Pt left in bed with daughter present.    Therapy Documentation Precautions:  Precautions Precautions: Fall Required Braces or Orthoses: Knee Immobilizer - Right Knee Immobilizer - Right: On at all times Restrictions Weight Bearing Restrictions: Yes RLE Weight Bearing: Non weight bearing  Pain: Pain Assessment Pain Assessment: Faces Pain Score: 10-Worst pain ever Faces Pain Scale: Hurts a little bit Pain Type: Acute pain Pain Location: Knee Pain Orientation: Right Pain Onset: With Activity Pain Intervention(s): Repositioned Multiple Pain Sites: Yes 2nd Pain Site Wong-Baker Pain Rating: Hurts even more Pain Type: Acute pain Pain Location: Arm Pain Orientation: Right Pain Descriptors / Indicators:  Sore Pain Onset: With Activity Pain Intervention(s): Cold applied;Other (Comment) (kinesiotape) ADL:  Function:        Bed Mobility Roll left and right activity   Assist level: No help, No cues, assistive device, takes more than a reasonable amount of time  Sit to lying activity   Assist level: Supervision or verbal cues  Lying to sitting activity   Assist level: Supervision or verbal cues  Mobility details Bed mobility details: Verbal cues for safe use of DME/AE   Transfers Sit to stand transfer   Sit to stand assist level: Supervision or verbal cues Sit to stand assistive device: Walker;Orthosis;Armrests  Chair/bed transfer   Chair/bed transfer method: Stand pivot Chair/bed transfer assist level: Supervision or verbal cues Chair/bed transfer assistive device: Walker   Chair/bed transfer details: Verbal cues for sequencing         Cognition Comprehension Comprehension assist level: Understands complex 90% of the time/cues 10% of the time  Expression Expression assist level: Expresses complex 90% of the time/cues < 10% of the time  Social Interaction Social Interaction assist level: Interacts appropriately 90% of the time - Needs monitoring or encouragement for participation or interaction.  Problem Solving Problem solving assist level: Solves complex 90% of the time/cues < 10% of the time  Memory Memory assist level: Recognizes or recalls 90% of the time/requires cueing < 10% of the time    Therapy/Group: Individual Therapy  Simrat Kendrick OTR/L 02/26/2015, 3:57 PM

## 2015-02-26 NOTE — Progress Notes (Signed)
Occupational Therapy Session Note  Patient Details  Name: Tanner [name] MRN: [id number] Date of Birth: Jan 04, 1956  Today's Date: 02/26/2015 OT Individual Time: 0830-1000 OT Individual Time Calculation (min): 90 min   Short Term Goals: Week 1:  OT Short Term Goal 1 (Week 1): STG=LTG due short anticipated LOS  Skilled Therapeutic Interventions/Progress Updates: ADL-retraining with focus on improved toileting, pain management, adapted bathing/dressing skills, and effective use of DME/AE.   Pt received in bed and completes self-feeding at edge of bed unassist,  toileting at [facility] with extra time and setup assist to knee immobilizer, ambulates to shower and bathes sitting at shower chair with setup assist only to provide supplies and adjust water.   Pt dresses at sink, standing supported to pull up his pants after setup to manage clothing over knee immobilizer.        Therapy Documentation Precautions:  Precautions Precautions: Fall Required Braces or Orthoses: Knee Immobilizer - Right Knee Immobilizer - Right: On at all times Restrictions Weight Bearing Restrictions: Yes RLE Weight Bearing: Non weight bearing  Vital Signs: Therapy Vitals Temp: 97.8 F (36.6 C) Temp Source: Oral Pulse Rate: 65 Resp: 18 BP: (!) 149/78 mmHg Patient Position (if appropriate): Lying Oxygen Therapy SpO2: 96 % O2 Device: Not Delivered   Pain: Pain Assessment Pain Assessment: 0-10 Pain Score: 10-Worst pain ever Pain Location: Knee Pain Orientation: Right Pain Descriptors / Indicators: Aching Patients Stated Pain Goal: 6 Pain Intervention(s): Medication (See eMAR);Repositioned  Function:   Eating Eating   Eating Assist Level: No help, No cues           Grooming Oral Care,Brush Teeth, Clean Dentures Activity:             Wash, Rinse, Dry Face Activity          Wash, Rinse, Dry Hands Activity          Brush, Comb Hair Activity        Shave Activity          Apply Makeup  Activity                                                             Bathing Bathing position   Position: Shower  Bathing parts Body parts bathed by patient: Right arm;Left arm;Chest;Abdomen;Front perineal area;Buttocks;Right upper leg;Left upper leg;Left lower leg    Bathing assist Assist Level: Supervision or verbal cues       Upper Body Dressing/Undressing Upper body dressing   What is the patient wearing?: Pull over shirt/dress     Pull over shirt/dress - Perfomed by patient: Thread/unthread right sleeve;Thread/unthread left sleeve;Put head through opening;Pull shirt over trunk          Upper body assist Assist Level: No help, No cues   Set up : To obtain clothing/put away   Lower Body Dressing/Undressing Lower body dressing   What is the patient wearing?: Non-skid slipper socks;Pants Underwear - Performed by patient: Thread/unthread left underwear leg;Pull underwear up/down Underwear - Performed by helper: Thread/unthread right underwear leg Pants- Performed by patient: Thread/unthread left pants leg;Pull pants up/down;Fasten/unfasten pants Pants- Performed by helper: Thread/unthread right pants leg Non-skid slipper socks- Performed by patient: Don/doff left sock Non-skid slipper socks- Performed by helper: Don/doff right sock  Lower body assist Assist Level: Touching or steadying assistance (Pt > 75%)   Set up : To obtain clothing/put away   Toileting Toileting   Toileting steps completed by patient: Adjust clothing prior to toileting;Performs perineal hygiene;Adjust clothing after toileting      Toileting assist      Bed Mobility Roll left and right activity   Assist level: Set up only  Sit to lying activity   Assist level: No Help, No cues  Lying to sitting activity      Mobility details     Transfers Sit to stand transfer   Sit to stand assist level: No help, no cues, assistive device, takes more than a reasonable amount of  time    Chair/bed transfer   Chair/bed transfer method: Stand pivot Chair/bed transfer assist level: No Help, no cues, assistive device, takes more than a reasonable amount of time Chair/bed transfer assistive device: Public librarian transfer assistive device: Bedside commode;Walker Assist level to bedside commode (at bedside): No Help, no cues, assistive device, takes more than a reasonable amount of time Assist level from bedside commode (at bedside): No Help, no cues, assistive device, takes more than a reasonable amount of time        Tub/shower transfer   Tub/shower assistive device: Shower chair;Walk in shower;Walker;Grab bars   Assist level into tub: No Help, no cues, assistive device, takes more than a reasonable amount of time Assist level out of tub: No Help, no cues, assistive device, takes more than a reasonable amount of time   Cognition Comprehension Comprehension assist level: Follows complex conversation/direction with no assist  Expression Expression assist level: Expresses complex ideas: With no assist  Social Interaction Social Interaction assist level: Interacts appropriately with others - No medications needed.  Problem Solving Problem solving assist level: Solves complex problems: With extra time  Memory Memory assist level: Complete Independence: No helper    Therapy/Group: Individual Therapy  [place] 03/01/2015, 9:01 AM

## 2015-02-26 NOTE — Plan of Care (Signed)
Problem: RH PAIN MANAGEMENT Goal: RH STG PAIN MANAGED AT OR BELOW PT'S PAIN GOAL Assess pain q shift and PRN pain goal of 5  Outcome: Progressing Able to manage pain with one dose of pain meds.

## 2015-02-26 NOTE — Progress Notes (Signed)
Subjective/Complaints: Mild nausea ,no further vomiting, poor intake Decreased urination, concentrated urine in bottle + fatigue ROS- neg SOB, neg CP, neg abd pain.  + RLE pain Objective: Vital Signs: Blood pressure 147/84, pulse 73, temperature 98.1 F (36.7 C), temperature source Oral, resp. rate 18, height _0  (1.778 m), weight 81.45 kg (179 lb 9 oz), SpO2 95 %. No results found. Results for orders placed or performed during the hospital encounter of 02/20/15 (from the past 72 hour(s))  CBC     Status: Abnormal   Collection Time: 02/25/15  8:10 AM  Result Value Ref Range   WBC 6.8 4.0 - 10.5 K/uL   RBC 3.83 (L) 4.22 - 5.81 MIL/uL   Hemoglobin 12.5 (L) 13.0 - 17.0 g/dL   HCT 38.3 (L) 39.0 - 52.0 %   MCV 100.0 78.0 - 100.0 fL   MCH 32.6 26.0 - 34.0 pg   MCHC 32.6 30.0 - 36.0 g/dL   RDW 12.7 11.5 - 15.5 %   Platelets 189 150 - 400 K/uL  Basic metabolic panel     Status: None   Collection Time: 02/25/15  8:10 AM  Result Value Ref Range   Sodium 139 135 - 145 mmol/L   Potassium 3.7 3.5 - 5.1 mmol/L   Chloride 103 101 - 111 mmol/L   CO2 28 22 - 32 mmol/L   Glucose, Bld 93 65 - 99 mg/dL   BUN 14 6 - 20 mg/dL   Creatinine, Ser 1.02 0.61 - 1.24 mg/dL   Calcium 9.2 8.9 - 10.3 mg/dL   GFR calc non Af Amer >60 >60 mL/min   GFR calc Af Amer >60 >60 mL/min    Comment: (NOTE) The eGFR has been calculated using the CKD EPI equation. This calculation has not been validated in all clinical situations. eGFR's persistently <60 mL/min signify possible Chronic Kidney Disease.    Anion gap 8 5 - 15     HEENT: normal Cardio: RRR and no murmur Resp: CTA B/L and unlabored GI: BS positive and NT, ND Extremity:  Pulses positive and No Edema LLE, R calf edemaSkin:   Intact Neuro: Alert/Oriented and Abnormal Motor 5/5 in BUE, 2- R HF, ANkle DF/PF, in knee immob, Left LE 4+/5 Musc/Skel:  Swelling right leg and foot, non tender no erythema Gen NAD Mood/affect no lability or  agitation  Assessment/Plan: 1. 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 Conservative care knee brace at all times nonweightbearing which require 3+ hours per day of interdisciplinary therapy in a comprehensive inpatient rehab setting. Physiatrist is providing close team supervision and 24 hour management of active medical problems listed below. Physiatrist and rehab team continue to assess barriers to discharge/monitor patient progress toward functional and medical goals.   FIM: Function - Bathing Bathing activity did not occur: N/A Position: Bed Body parts bathed by patient: Front perineal area Body parts bathed by helper: Buttocks, Right upper leg, Left upper leg Bathing not applicable: Right lower leg Assist Level: Touching or steadying assistance(Pt > 75%)  Function- Upper Body Dressing/Undressing Upper body dressing/undressing activity did not occur: N/A What is the patient wearing?: Pull over shirt/dress Pull over shirt/dress - Perfomed by patient: Thread/unthread left sleeve, Thread/unthread right sleeve, Put head through opening, Pull shirt over trunk Assist Level: More than reasonable time Function - Lower Body Dressing/Undressing Lower body dressing/undressing activity did not occur: 0: Wears gown/pajamas-no public clothing What is the patient wearing?: Shoes,  Pants, Ted Hose Position: Sitting EOB Pants- Performed by patient: Thread/unthread right pants leg, Thread/unthread left pants leg, Fasten/unfasten pants Socks - Performed by patient: Don/doff right sock, Don/doff left sock Shoes - Performed by patient: Don/doff left shoe, Fasten left TED Hose - Performed by helper: Don/doff left TED hose Assist Level: Set up Set up : Don/doff TED stockings, To obtain clothing/put away  Function - Toileting Toileting activity did not occur: N/A Toileting steps completed by patient: Adjust clothing prior to  toileting, Adjust clothing after toileting, Performs perineal hygiene Toileting steps completed by helper: Adjust clothing prior to toileting Toileting Assistive Devices: Grab bar or rail Assist level: Set up/obtain supplies  Function - Air cabin crew transfer assistive device: Bedside commode, Walker Assist level to bedside commode (at bedside): Touching or steadying assistance (Pt > 75%) Assist level from bedside commode (at bedside): Touching or steadying assistance (Pt > 75%)  Function - Chair/bed transfer Chair/bed transfer method: Stand pivot Chair/bed transfer assist level: Supervision or verbal cues Chair/bed transfer assistive device: Armrests, Environmental consultant, Orthosis Chair/bed transfer details: Verbal cues for technique  Function - Locomotion: Wheelchair Will patient use wheelchair at discharge?: Yes Type: Manual Max wheelchair distance: 200 Assist Level: No help, No cues, assistive device, takes more than reasonable amount of time Assist Level: No help, No cues, assistive device, takes more than reasonable amount of time Assist Level: No help, No cues, assistive device, takes more than reasonable amount of time Function - Locomotion: Ambulation Assistive device: Walker-rolling, Orthosis Max distance: 85 Assist level: Touching or steadying assistance (Pt > 75%) Assist level: Touching or steadying assistance (Pt > 75%) Walk 50 feet with 2 turns activity did not occur: Safety/medical concerns Assist level: Touching or steadying assistance (Pt > 75%) Walk 150 feet activity did not occur: Safety/medical concerns Walk 10 feet on uneven surfaces activity did not occur: Safety/medical concerns  Function - Comprehension Comprehension: Auditory Comprehension assist level: Understands complex 90% of the time/cues 10% of the time  Function - Expression Expression: Verbal Expression assist level: Expresses complex 90% of the time/cues < 10% of the time  Function - Social  Interaction Social Interaction assist level: Interacts appropriately 90% of the time - Needs monitoring or encouragement for participation or interaction.  Function - Problem Solving Problem solving assist level: Solves basic 90% of the time/requires cueing < 10% of the time  Function - Memory Memory assist level: Recognizes or recalls 90% of the time/requires cueing < 10% of the time  Medical Problem List and Plan: 1. Functional deficits secondary to right proximal tibia fracture. Conservative care knee brace at all times nonweightbearing 2. DVT Prophylaxis/Anticoagulation: . Monitor for any bleeding episodes.Right post tibial +/- popliteal thrombus,  On  Xarelto for d/c 3. Pain Management: off morphine, now just taking tylenol per pt request   Robaxin 4. Mood/anxiety: Prozac 20 mg daily. Provide emotional support 5. Neuropsych: This patient is capable of making decisions on his own behalf. 6. Skin/Wound Care: Routine skin checks 7. Fluids/Electrolytes/Nutrition: Routine I&O with follow-up chemistries reviewed and normal , less nauseated after IVF, will d/c 8. History of CVA February 2015.mainly RLE residual  Continue aspirin therapy 9. Hypertension. Lopressor 50 mg twice a day. Monitor with increased mobility, HR and BP controlled 10. History of prostate cancer. Status post radical prostatectomy 11. Hyperlipidemia. Zocor 12. History of alcohol. Now showing signs of  Withdrawal,CIWA protocol, score of 0 this am, may be able to d/c protocol in am 13.  Poor intake 70-100% meals 14.  Anemia-resolved, checking stool guaic given ETOH LOS (Days) 6 A FACE TO FACE EVALUATION WAS PERFORMED  Daden Mahany E 02/26/2015, 8:10 AM

## 2015-02-27 ENCOUNTER — Inpatient Hospital Stay (HOSPITAL_COMMUNITY): Payer: Self-pay | Admitting: Occupational Therapy

## 2015-02-27 ENCOUNTER — Inpatient Hospital Stay (HOSPITAL_COMMUNITY): Payer: Medicare Other | Admitting: Physical Therapy

## 2015-02-27 DIAGNOSIS — S82209S Unspecified fracture of shaft of unspecified tibia, sequela: Secondary | ICD-10-CM

## 2015-02-27 DIAGNOSIS — F1010 Alcohol abuse, uncomplicated: Secondary | ICD-10-CM

## 2015-02-27 DIAGNOSIS — I82441 Acute embolism and thrombosis of right tibial vein: Secondary | ICD-10-CM | POA: Diagnosis not present

## 2015-02-27 LAB — CREATININE, SERUM
Creatinine, Ser: 1.07 mg/dL (ref 0.61–1.24)
GFR calc Af Amer: >60 mL/min (ref >60)

## 2015-02-27 NOTE — Plan of Care (Signed)
Problem: RH Balance Goal: LTG Patient will maintain dynamic standing balance (PT) LTG: Patient will maintain dynamic standing balance with assistance during mobility activities (PT)  Goal downgraded due to lack of progress  Problem: RH Car Transfers Goal: LTG Patient will perform car transfers with assist (PT) LTG: Patient will perform car transfers with assistance (PT).  Goal upgraded due to progress

## 2015-02-27 NOTE — Progress Notes (Signed)
Physical Therapy Session Note  Patient Details  Name: Tanner [name] MRN: [id number] Date of Birth: 08-Nov-1955  Today's Date: 02/27/2015 PT Individual Time: 0900-0945 PT Individual Time Calculation (min): 45 min   Short Term Goals: Week 1:  PT Short Term Goal 1 (Week 1): STG=LTG due to LOS, Mod I transfers and short distance gait with RW  Skilled Therapeutic Interventions/Progress Updates:    Pt received in bed - agreeable to PT, but c/o R leg soreness and R elbow soreness. R knee immobilizer in place and allevyn patch over R elbow - pt wishes to wait to ask for medication until after PT session. Gait Training - see below. Pt req CGA, progressing to close SBA, verbal cues given for smaller step length when turning for increased stability. W/C Management - see below. Pt req cues for management of donning/doffoing R elevating legrest. Neuromuscular Reeducation - PT instructs pt in standing dynamic balance activity playing checkers with L arm support on movable tray - pt tolerates standing x 8 minutes without LOB req SBA.   Pt's ambulation goals downgraded to supervision and car transfer goal upgraded to supervision. Pt's daughter needs hands on assist with assisting pt in stair negotiation with L rail and R crutch. Continue per PT POC.   Therapy Documentation Precautions:  Precautions Precautions: Fall Required Braces or Orthoses: Knee Immobilizer - Right Knee Immobilizer - Right: On at all times Restrictions Weight Bearing Restrictions: Yes RLE Weight Bearing: Non weight bearing Pain: Pain Assessment Pain Assessment: 0-10 Pain Score: 5  Pain Type: Acute pain Pain Location: Knee Pain Orientation: Right Pain Descriptors / Indicators: Aching;Throbbing;Constant Pain Onset: On-going Pain Intervention(s): Rest Multiple Pain Sites: Yes 2nd Pain Site Pain Score: 5 Pain Type: Acute pain Pain Location: Elbow Pain Orientation: Right Pain Descriptors / Indicators: Sore Pain Onset: With  Activity Pain Intervention(s): Rest   Function:  Toileting Toileting       Bed Mobility Roll left and right activity      Sit to lying activity      Lying to sitting activity   Assist level: No help, No cues, assistive device, takes more than a reasonable amount of time  Mobility details     Transfers Sit to stand transfer   Sit to stand assist level: No help, no cues, assistive device, takes more than a reasonable amount of time Sit to stand assistive device: Armrests;Walker  Chair/bed transfer   Chair/bed transfer method: Stand pivot Chair/bed transfer assist level: Supervision or verbal cues Chair/bed transfer assistive device: Walker   Chair/bed transfer details: Verbal cues for Estate agent     Max distance: 85 Assist level: Touching or steadying assistance (Pt > 75%) (CGA progressing to SBA)  Walk 10 feet activity   Assist level: Touching or steadying assistance (Pt > 75%)  Walk 50 feet with 2 turns activity   Assist level: Touching or steadying assistance (Pt > 75%)  Walk 150 feet activity      Walk 10 feet on uneven surfaces activity      Stairs   Stairs assistive device: 1 hand rail;Other (comment) (one crutch) Max number of stairs: 4 Stairs assist level: Touching or steadying assistance (Pt > 75%)  Walk up/down 1 step activity        Walk up/down 4 steps activity   Walk up/down 4 steps assist level: Touching or steadying assistance (  Pt > 75%)  Walk up/down 12 steps activity      Pick up small objects from floor      Wheelchair   Type: Manual Max wheelchair distance: 200 Assist Level: No help, No cues, assistive device, takes more than reasonable amount of time  Wheel 50 feet with 2 turns activity   Assist Level: No help, No cues, assistive device, takes more than reasonable amount of time  Wheel 150 feet activity   Assist Level: No help, No cues, assistive device, takes  more than reasonable amount of time   Cognition Comprehension Comprehension assist level: Follows complex conversation/direction with extra time/assistive device  Expression Expression assist level: Expresses complex ideas: With extra time/assistive device  Social Interaction Social Interaction assist level: Interacts appropriately with others - No medications needed.  Problem Solving Problem solving assist level: Solves complex 90% of the time/cues < 10% of the time  Memory Memory assist level: More than reasonable amount of time    Therapy/Group: Individual Therapy  Tanner [name] 02/27/2015, 9:31 AM

## 2015-02-27 NOTE — Progress Notes (Signed)
Subjective/Complaints: [place] self to therapy, no shakiness, some sweating last noc naueated yesterday but reports eating ok, still with R tibial pain ROS- neg SOB, neg CP, neg abd pain.  + RLE pain, no vomiting Objective: Vital Signs: Blood pressure 163/86, pulse 65, temperature 98.1 F (36.7 C), temperature source Oral, resp. rate 18, height _0  (1.778 m), weight 81.45 kg (179 lb 9 oz), SpO2 96 %. No results found. Results for orders placed or performed during the hospital encounter of 02/20/15 (from the past 72 hour(s))  CBC     Status: Abnormal   Collection Time: 02/25/15  8:10 AM  Result Value Ref Range   WBC 6.8 4.0 - 10.5 K/uL   RBC 3.83 (L) 4.22 - 5.81 MIL/uL   Hemoglobin 12.5 (L) 13.0 - 17.0 g/dL   HCT 38.3 (L) 39.0 - 52.0 %   MCV 100.0 78.0 - 100.0 fL   MCH 32.6 26.0 - 34.0 pg   MCHC 32.6 30.0 - 36.0 g/dL   RDW 12.7 11.5 - 15.5 %   Platelets 189 150 - 400 K/uL  Basic metabolic panel     Status: None   Collection Time: 02/25/15  8:10 AM  Result Value Ref Range   Sodium 139 135 - 145 mmol/L   Potassium 3.7 3.5 - 5.1 mmol/L   Chloride 103 101 - 111 mmol/L   CO2 28 22 - 32 mmol/L   Glucose, Bld 93 65 - 99 mg/dL   BUN 14 6 - 20 mg/dL   Creatinine, Ser 1.02 0.61 - 1.24 mg/dL   Calcium 9.2 8.9 - 10.3 mg/dL   GFR calc non Af Amer >60 >60 mL/min   GFR calc Af Amer >60 >60 mL/min    Comment: (NOTE) The eGFR has been calculated using the CKD EPI equation. This calculation has not been validated in all clinical situations. eGFR's persistently <60 mL/min signify possible Chronic Kidney Disease.    Anion gap 8 5 - 15     HEENT: normal Cardio: RRR and no murmur Resp: CTA B/L and unlabored GI: BS positive and NT, ND Extremity:  Pulses positive and No Edema LLE, R calf edemaSkin:   Intact Neuro: Alert/Oriented and Abnormal Motor 5/5 in BUE, 2- R HF, ANkle DF/PF, in knee immob, Left LE 4+/5 Musc/Skel:  Swelling right leg and foot, non tender no erythema Gen  NAD Mood/affect no lability or agitation  Assessment/Plan: 1. 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 Conservative care knee brace at all times nonweightbearing which require 3+ hours per day of interdisciplinary therapy in a comprehensive inpatient rehab setting. Physiatrist is providing close team supervision and 24 hour management of active medical problems listed below. Physiatrist and rehab team continue to assess barriers to discharge/monitor patient progress toward functional and medical goals.   FIM: Function - Bathing Bathing activity did not occur: N/A Position: Other (comment) (on Tub Bench in standard tub) Body parts bathed by patient: Right arm, Left arm, Chest, Abdomen, Front perineal area, Buttocks, Left upper leg Body parts bathed by helper: Buttocks, Right upper leg, Left upper leg Bathing not applicable: Right lower leg, Right upper leg Assist Level: Assistive device (LH sponge not used by patient)  Function- Upper Body Dressing/Undressing Upper body dressing/undressing activity did not occur: N/A What is the patient wearing?: Pull over shirt/dress Pull over shirt/dress - Perfomed by patient: Thread/unthread right sleeve, Thread/unthread left sleeve, Put head through opening, Pull shirt over  trunk Assist Level: Set up Function - Lower Body Dressing/Undressing Lower body dressing/undressing activity did not occur: 0: Wears gown/pajamas-no public clothing What is the patient wearing?: Socks, Pants Position: Wheelchair/chair at sink Pants- Performed by patient: Thread/unthread left pants leg, Pull pants up/down Pants- Performed by helper: Thread/unthread right pants leg Socks - Performed by patient: Don/doff left sock Socks - Performed by helper: Don/doff right sock Shoes - Performed by patient: Don/doff left shoe, Fasten left TED Hose - Performed by helper: Don/doff left TED hose Assist Level:  Touching or steadying assistance (Pt > 75%) Set up : Don/doff TED stockings, To obtain clothing/put away  Function - Toileting Toileting activity did not occur: N/A Toileting steps completed by patient: Adjust clothing prior to toileting, Performs perineal hygiene, Adjust clothing after toileting Toileting steps completed by helper: Performs perineal hygiene Toileting Assistive Devices: Grab bar or rail Assist level: Touching or steadying assistance (Pt.75%)  Function - Air cabin crew transfer assistive device: Bedside commode, Walker Assist level to bedside commode (at bedside): Touching or steadying assistance (Pt > 75%) Assist level from bedside commode (at bedside): Supervision or verbal cues  Function - Chair/bed transfer Chair/bed transfer method: Stand pivot Chair/bed transfer assist level: Supervision or verbal cues Chair/bed transfer assistive device: Walker Chair/bed transfer details: Verbal cues for sequencing  Function - Locomotion: Wheelchair Will patient use wheelchair at discharge?: Yes Type: Manual Max wheelchair distance: 200 Assist Level: No help, No cues, assistive device, takes more than reasonable amount of time Assist Level: No help, No cues, assistive device, takes more than reasonable amount of time Assist Level: No help, No cues, assistive device, takes more than reasonable amount of time Function - Locomotion: Ambulation Assistive device: Walker-rolling, Orthosis Max distance: 8 Assist level: Touching or steadying assistance (Pt > 75%) Assist level: Touching or steadying assistance (Pt > 75%) Walk 50 feet with 2 turns activity did not occur: Safety/medical concerns Assist level: Touching or steadying assistance (Pt > 75%) Walk 150 feet activity did not occur: Safety/medical concerns Walk 10 feet on uneven surfaces activity did not occur: Safety/medical concerns  Function - Comprehension Comprehension: Auditory Comprehension assist level:  Understands complex 90% of the time/cues 10% of the time  Function - Expression Expression: Verbal Expression assist level: Expresses complex 90% of the time/cues < 10% of the time  Function - Social Interaction Social Interaction assist level: Interacts appropriately 90% of the time - Needs monitoring or encouragement for participation or interaction.  Function - Problem Solving Problem solving assist level: Solves complex 90% of the time/cues < 10% of the time  Function - Memory Memory assist level: Recognizes or recalls 90% of the time/requires cueing < 10% of the time  Medical Problem List and Plan: 1. Functional deficits secondary to right proximal tibia fracture. Conservative care knee brace at all times nonweightbearing 2. DVT Prophylaxis/Anticoagulation: . Monitor for any bleeding episodes.Right post tibial +/- popliteal thrombus,  On  Xarelto for d/c 3. Pain Management: off morphine, now just taking tylenol per pt request   Robaxin 4. Mood/anxiety: Prozac 20 mg daily. Provide emotional support 5. Neuropsych: This patient is capable of making decisions on his own behalf. 6. Skin/Wound Care: Routine skin checks 7. Fluids/Electrolytes/Nutrition: Routine I&O with follow-up chemistries reviewed and normal , less nauseated after IVF, will d/c IV 8. History of CVA February 2015.mainly RLE residual  Continue aspirin therapy 9. Hypertension. Lopressor 50 mg twice a day. Monitor with increased mobility, HR and BP controlled 10. History of prostate cancer. Status  post radical prostatectomy 11. Hyperlipidemia. Zocor 12. History of alcohol. Now showing signs of  Withdrawal,CIWA protocol, score of 0 this am, d/c protocol 13.  improved intake 90-100% meals 14.  Anemia-resolved, checking stool guaic given ETOH LOS (Days) 7 A FACE TO FACE EVALUATION WAS PERFORMED  Tanner [name] E 02/27/2015, 9:26 AM

## 2015-02-27 NOTE — Progress Notes (Signed)
Occupational Therapy Session Note  Patient Details  Name: Tanner [name] MRN: [id number] Date of Birth: 1956/02/18  Today's Date: 02/27/2015 OT Individual Time: 1345-1430 OT Individual Time Calculation (min): 45 min    Short Term Goals: Week 1:  OT Short Term Goal 1 (Week 1): STG=LTG due short anticipated LOS  Skilled Therapeutic Interventions/Progress Updates:    1:1 focus on short distance functional ambulation with RW with focus on dynamic balance, maintaining WB precautions, and activity tolerance. Transitioned into the gym via w/c. Transferred w/c<mat with close supervision with setup of w/c. Focus on sit to stands without UE support for balance training and left LE strengthening. Pt able ot perform sit to stands with supervision and then focused on controlled decent onto the mat without UE support. Discussed and educated on bathing and caring for skin underneath of KI and only with entire LE supported on the mat. Continued strengthening with LE raises with minimal support. Transitioned back to room and into recliner with close supervision.   Therapy Documentation Precautions:  Precautions Precautions: Fall Required Braces or Orthoses: Knee Immobilizer - Right Knee Immobilizer - Right: On at all times Restrictions Weight Bearing Restrictions: Yes RLE Weight Bearing: Non weight bearing Pain:  ongoing discomfort in right LE but able to continue.   Therapy/Group: Individual Therapy  [name] [facility] 02/27/2015, 3:37 PM

## 2015-02-28 ENCOUNTER — Inpatient Hospital Stay (HOSPITAL_COMMUNITY): Payer: Medicare Other | Admitting: Occupational Therapy

## 2015-02-28 LAB — CBC
HEMATOCRIT: 32.3 % — AB (ref 39.0–52.0)
HEMOGLOBIN: 10.6 g/dL — AB (ref 13.0–17.0)
MCH: 32.3 pg (ref 26.0–34.0)
MCHC: 32.8 g/dL (ref 30.0–36.0)
MCV: 98.5 fL (ref 78.0–100.0)
Platelets: 228 10*3/uL (ref 150–400)
RBC: 3.28 MIL/uL — AB (ref 4.22–5.81)
RDW: 12.6 % (ref 11.5–15.5)
WBC: 5 10*3/uL (ref 4.0–10.5)

## 2015-02-28 MED ORDER — ACETAMINOPHEN-CODEINE #3 300-30 MG PO TABS
1.0000 | ORAL_TABLET | ORAL | Status: DC | PRN
  Administered 2015-02-28 – 2015-03-02 (×9): 1 via ORAL
  Filled 2015-02-28 (×9): qty 1

## 2015-02-28 MED ORDER — ACETAMINOPHEN-CODEINE #4 300-60 MG PO TABS: 1.0000 | ORAL_TABLET | ORAL | Status: DC | PRN

## 2015-02-28 MED ORDER — CODEINE SULFATE 15 MG PO TABS
30.0000 mg | ORAL_TABLET | ORAL | Status: DC | PRN
  Administered 2015-03-01 (×4): 30 mg via ORAL
  Filled 2015-02-28 (×4): qty 2

## 2015-02-28 MED ORDER — SENNOSIDES-DOCUSATE SODIUM 8.6-50 MG PO TABS
2.0000 | ORAL_TABLET | Freq: Two times a day (BID) | ORAL | Status: DC
  Administered 2015-02-28 – 2015-03-02 (×4): 2 via ORAL
  Filled 2015-02-28 (×7): qty 2

## 2015-02-28 NOTE — Progress Notes (Signed)
Occupational Therapy Session Note  Patient Details  Name: Tanner [name] MRN: [id number] Date of Birth: [birth year]  Today's Date: 02/28/2015 OT Individual Time: [id number]  (60 min) OT Individual Time Calculation (min): 60 min    Short Term Goals: Week 1:  OT Short Term Goal 1 (Week 1): STG=LTG due short anticipated LOS Week 2:     Skilled Therapeutic Interventions/Progress Updates:   Addressed basic transfers, bed mobility, ADL retraining, endurance.  Pt. In good spirits.  Requested to use BSC to have BM.  Did not want to ambulate with RW to bathroom.  Did not want to take shower.  SEE Below.   Educated pt on LB dressing using AE, transfers and sit to stand. Left pt in bed with safety bell on and call bell,phone within reach.     Therapy Documentation Precautions:  Precautions Precautions: Fall Required Braces or Orthoses: Knee Immobilizer - Right Knee Immobilizer - Right: On at all times Restrictions Weight Bearing Restrictions: Yes RLE Weight Bearing: Non weight bearing      Pain:4/10  Right knee    :   :    Function:   Eating Eating Eating activity did not occur: N/A Eating Assist Level: No help, No cues           Grooming Oral Care,Brush Teeth, Clean Dentures Activity:    Oral care, brush teeth, clean dentures activity did not occur: N/A Assist Level: More than reasonable amount of time   Set up : To obtain items  Wash, Rinse, Dry Face Activity   Assist Level: No help, No cues      Wash, Rinse, Dry Hands Activity   Assist Level: No help, No cues      Brush, Comb Hair Activity   Assist Level: No help, No cues    Shave Activity Shave activity did not occur: Patient does not shave        Apply Makeup Activity                                                             Bathing Bathing position   Position: Sitting EOB  Bathing parts Body parts bathed by patient: Right arm;Left arm;Chest;Abdomen;Front perineal area;Buttocks;Left upper  leg;Right upper leg;Back Body parts bathed by helper: Buttocks;Right lower leg;Left lower leg  Bathing assist Assist Level: Supervision or verbal cues       Upper Body Dressing/Undressing Upper body dressing Upper body dressing/undressing activity did not occur: N/A What is the patient wearing?: Pull over shirt/dress     Pull over shirt/dress - Perfomed by patient: Thread/unthread right sleeve;Thread/unthread left sleeve;Put head through opening;Pull shirt over trunk          Upper body assist Assist Level: Set up   Set up : To obtain clothing/put away   Lower Body Dressing/Undressing Lower body dressing   What is the patient wearing?: Underwear;Pants;Socks Underwear - Performed by patient: Thread/unthread right underwear leg;Thread/unthread left underwear leg;Pull underwear up/down Underwear - Performed by helper: Pull underwear up/down Pants- Performed by patient: Thread/unthread left pants leg;Pull pants up/down;Thread/unthread right pants leg         Socks - Performed by helper: Don/doff right sock;Don/doff left sock              Lower body assist Assist Level: Touching or  steadying assistance (Pt > 75%)       Toileting Toileting   Toileting steps completed by patient: Adjust clothing prior to toileting;Performs perineal hygiene;Adjust clothing after toileting   Toileting Assistive Devices:  (used BSC)  Toileting assist Assist level: Touching or steadying assistance (Pt.75%)    Bed Mobility Roll left and right activity   Assist level: No help, No cues, assistive device, takes more than a reasonable amount of time  Sit to lying activity   Assist level: Supervision or verbal cues  Lying to sitting activity   Assist level: Supervision or verbal cues  Mobility details Bed mobility details: Verbal cues for safe use of DME/AE   Transfers Sit to stand transfer   Sit to stand assist level: No help, no cues, assistive device, takes more than a reasonable amount  of time Sit to stand assistive device: Bedrails  Chair/bed transfer              Toilet transfer   Toilet transfer assistive device: Bedside commode;Walker Assist level to bedside commode (at bedside): Supervision or verbal cues Assist level from bedside commode (at bedside): Supervision or verbal cues   Assist level to toilet: Supervision or verbal cues Assist level from toilet: Supervision or verbal cues  Tub/shower transfer             Cognition Comprehension Comprehension assist level: Follows complex conversation/direction with extra time/assistive device  Expression Expression assist level: Expresses complex ideas: With extra time/assistive device  Social Interaction Social Interaction assist level: Interacts appropriately with others - No medications needed.  Problem Solving Problem solving assist level: Solves complex 90% of the time/cues < 10% of the time  Memory Memory assist level: More than reasonable amount of time    Therapy/Group: Individual Therapy  Lisa Roca 02/28/2015, 9:15 AM

## 2015-02-28 NOTE — Progress Notes (Addendum)
Subjective/Complaints: Participating in PT/OT      ROS- neg SOB, neg CP, neg abd pain.  + RLE pain,  Objective: Vital Signs: Blood pressure 147/77, pulse 61, temperature 98 F (36.7 C), temperature source Oral, resp. rate 18, height 5' 10"  (1.778 m), weight 81.45 kg (179 lb 9 oz), SpO2 94 %. No results found. Results for orders placed or performed during the hospital encounter of 02/20/15 (from the past 72 hour(s))  Creatinine, serum     Status: None   Collection Time: 02/27/15  4:55 AM  Result Value Ref Range   Creatinine, Ser 1.07 0.61 - 1.24 mg/dL   GFR calc non Af Amer >60 >60 mL/min   GFR calc Af Amer >60 >60 mL/min    Comment: (NOTE) The eGFR has been calculated using the CKD EPI equation. This calculation has not been validated in all clinical situations. eGFR's persistently <60 mL/min signify possible Chronic Kidney Disease.   CBC     Status: Abnormal   Collection Time: 02/28/15  5:39 AM  Result Value Ref Range   WBC 5.0 4.0 - 10.5 K/uL   RBC 3.28 (L) 4.22 - 5.81 MIL/uL   Hemoglobin 10.6 (L) 13.0 - 17.0 g/dL   HCT 32.3 (L) 39.0 - 52.0 %   MCV 98.5 78.0 - 100.0 fL   MCH 32.3 26.0 - 34.0 pg   MCHC 32.8 30.0 - 36.0 g/dL   RDW 12.6 11.5 - 15.5 %   Platelets 228 150 - 400 K/uL     HEENT: normal Cardio: RRR and no murmur Resp: CTA B/L and unlabored GI: BS positive and NT, ND Extremity:  Pulses positive and No Edema LLE, R calf edemaSkin:   Intact Neuro: Alert/Oriented and Abnormal Motor 5/5 in BUE, 2- R HF, ANkle DF/PF, in knee immob, Left LE 4+/5 Musc/Skel:  Swelling right leg and foot, non tender no erythema Gen NAD Mood/affect no lability or agitation, no sweats or shaking  Assessment/Plan: 1. 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 Conservative care knee brace at all times nonweightbearing which require 3+ hours per day of interdisciplinary therapy in a comprehensive inpatient rehab  setting. Physiatrist is providing close team supervision and 24 hour management of active medical problems listed below. Physiatrist and rehab team continue to assess barriers to discharge/monitor patient progress toward functional and medical goals.  Plan D/C in am   FIM: Function - Bathing Bathing activity did not occur: N/A Position: Sitting EOB Body parts bathed by patient: Right arm, Left arm, Chest, Abdomen, Front perineal area, Buttocks, Left upper leg, Right upper leg, Back Body parts bathed by helper: Buttocks, Right lower leg, Left lower leg Bathing not applicable: Right lower leg, Right upper leg Assist Level: Supervision or verbal cues  Function- Upper Body Dressing/Undressing Upper body dressing/undressing activity did not occur: N/A What is the patient wearing?: Pull over shirt/dress Pull over shirt/dress - Perfomed by patient: Thread/unthread right sleeve, Thread/unthread left sleeve, Put head through opening, Pull shirt over trunk Assist Level: Set up Set up : To obtain clothing/put away Function - Lower Body Dressing/Undressing Lower body dressing/undressing activity did not occur: 0: Wears gown/pajamas-no public clothing What is the patient wearing?: Underwear, Pants, Socks Position: Sitting EOB Underwear - Performed by patient: Thread/unthread right underwear leg, Thread/unthread left underwear leg, Pull underwear up/down Underwear - Performed by helper: Pull underwear up/down Pants- Performed by patient: Thread/unthread left pants leg, Pull pants up/down, Thread/unthread right  pants leg Pants- Performed by helper: Thread/unthread right pants leg Socks - Performed by patient: Don/doff left sock Socks - Performed by helper: Don/doff right sock, Don/doff left sock Shoes - Performed by patient: Don/doff left shoe, Fasten left TED Hose - Performed by helper: Don/doff left TED hose Assist Level: Touching or steadying assistance (Pt > 75%) Set up : Don/doff TED  stockings, To obtain clothing/put away  Function - Toileting Toileting activity did not occur: N/A Toileting steps completed by patient: Adjust clothing prior to toileting, Performs perineal hygiene, Adjust clothing after toileting Toileting steps completed by helper: Performs perineal hygiene Toileting Assistive Devices:  (used BSC) Assist level: Touching or steadying assistance (Pt.75%)  Function - Air cabin crew transfer assistive device: Bedside commode, Walker Assist level to toilet: Supervision or verbal cues Assist level from toilet: Supervision or verbal cues Assist level to bedside commode (at bedside): Supervision or verbal cues Assist level from bedside commode (at bedside): Supervision or verbal cues  Function - Chair/bed transfer Chair/bed transfer method: Stand pivot Chair/bed transfer assist level: Supervision or verbal cues Chair/bed transfer assistive device: Walker Chair/bed transfer details: Verbal cues for sequencing  Function - Locomotion: Wheelchair Will patient use wheelchair at discharge?: Yes Type: Manual Max wheelchair distance: 200 Assist Level: No help, No cues, assistive device, takes more than reasonable amount of time Assist Level: No help, No cues, assistive device, takes more than reasonable amount of time Assist Level: No help, No cues, assistive device, takes more than reasonable amount of time Function - Locomotion: Ambulation Assistive device: Walker-rolling, Orthosis (R knee immobilizer brace) Max distance: 85 Assist level: Touching or steadying assistance (Pt > 75%) (CGA progressing to SBA) Assist level: Touching or steadying assistance (Pt > 75%) Walk 50 feet with 2 turns activity did not occur: Safety/medical concerns Assist level: Touching or steadying assistance (Pt > 75%) Walk 150 feet activity did not occur: Safety/medical concerns Walk 10 feet on uneven surfaces activity did not occur: Safety/medical concerns  Function -  Comprehension Comprehension: Auditory Comprehension assist level: Follows complex conversation/direction with extra time/assistive device  Function - Expression Expression: Verbal Expression assist level: Expresses complex ideas: With extra time/assistive device  Function - Social Interaction Social Interaction assist level: Interacts appropriately with others - No medications needed.  Function - Problem Solving Problem solving assist level: Solves complex 90% of the time/cues < 10% of the time  Function - Memory Memory assist level: More than reasonable amount of time  Medical Problem List and Plan: 1. Functional deficits secondary to right proximal tibia fracture. Conservative care knee brace at all times nonweightbearing 2. DVT Prophylaxis/Anticoagulation: . Monitor for any bleeding episodes.Right post tibial +/- popliteal thrombus,  On  Xarelto for d/c 3. Pain Management: off morphine, now just taking tylenol per pt request   Robaxin 4. Mood/anxiety: Prozac 20 mg daily. Provide emotional support 5. Neuropsych: This patient is capable of making decisions on his own behalf. 6. Skin/Wound Care: Routine skin checks 7. Fluids/Electrolytes/Nutrition: Routine I&O  8. History of CVA February 2015.mainly RLE residual  Continue aspirin therapy 9. Hypertension. Lopressor 50 mg twice a day. Monitor with increased mobility,  10. History of prostate cancer. Status post radical prostatectomy 11. Hyperlipidemia. Zocor 12. History of alcohol.over most of withdrawal syndrome, advised pt to have dauhter remove ETOH from home, pt will not be driving for ~4UJ   LOS (Days) Rutherford E 02/28/2015, 9:48 AM

## 2015-03-01 ENCOUNTER — Inpatient Hospital Stay (HOSPITAL_COMMUNITY): Payer: Medicare Other

## 2015-03-01 ENCOUNTER — Inpatient Hospital Stay (HOSPITAL_COMMUNITY): Payer: Medicare Other | Admitting: Physical Therapy

## 2015-03-01 DIAGNOSIS — S82201S Unspecified fracture of shaft of right tibia, sequela: Secondary | ICD-10-CM

## 2015-03-01 DIAGNOSIS — I82441 Acute embolism and thrombosis of right tibial vein: Secondary | ICD-10-CM

## 2015-03-01 LAB — OCCULT BLOOD X 1 CARD TO LAB, STOOL: Fecal Occult Bld: NEGATIVE

## 2015-03-01 NOTE — Progress Notes (Signed)
Occupational Therapy Discharge Summary  Patient Details  Name: Tanner [name] MRN: [id number] Date of Birth: 20-Nov-1955  Patient has met 4 of 7 long term goals due to improved activity tolerance, improved balance and ability to compensate for deficits.  Patient to discharge at overall Modified Independent level, min assist to dress RLE.  Patient's care partner is independent to provide the necessary physical assistance at discharge.    Reasons goals not met: Pt unable to manage donning/doffing clothing over knee immobilizer unassisted, pt denied need for training in homemaking skills d/t daughter present and able to assist, prn, pt benefits from supervision for safety during dynamic standing tasks to maintain NWB precautions at RLE.  Recommendation:  Patient will benefit from ongoing skilled OT services in home health setting to continue to advance functional skills in the area of BADL and iADL however he may not be eligible for such services.  Equipment: Tub Bench  Reasons for discharge: discharge from hospital  Patient/family agrees with progress made and goals achieved: Yes  OT Discharge Precautions/Restrictions  Precautions Precautions: Fall Required Braces or Orthoses: Knee Immobilizer - Right Knee Immobilizer - Right: On at all times Restrictions RLE Weight Bearing: Non weight bearing  Vital Signs Therapy Vitals Temp: 98.1 F (36.7 C) Temp Source: Oral Pulse Rate: 78 Resp: 16 BP: 117/77 mmHg Patient Position (if appropriate): Lying Oxygen Therapy SpO2: 97 % O2 Device: Not Delivered  Pain Pain Assessment Pain Score: 7  Pain Type: Acute pain Pain Location: Knee Pain Orientation: Right Pain Descriptors / Indicators: Aching Pain Onset: On-going Pain Intervention(s): RN made aware 2nd Pain Site Pain Score: 5 Pain Type: Acute pain Pain Location: Elbow Pain Orientation: Right Pain Descriptors / Indicators: Sore Pain Onset: With Activity Pain Intervention(s): Rest  (kinesiotape applied)]  ADL See Functional Assessment  Vision/Perception  Vision- History Baseline Vision/History: Wears glasses Wears Glasses: Reading only Patient Visual Report: No change from baseline Perception Comments: WFL for B/iADL   Cognition Overall Cognitive Status: Within Functional Limits for tasks assessed Arousal/Alertness: Awake/alert Orientation Level: Oriented X4 Attention: Alternating Alternating Attention: Appears intact Memory: Appears intact Awareness: Appears intact Problem Solving: Appears intact Safety/Judgment: Appears intact   Sensation Sensation Light Touch: Impaired Detail Light Touch Impaired Details: Impaired RLE Stereognosis: Appears Intact Hot/Cold: Appears Intact Proprioception: Appears Intact Coordination Gross Motor Movements are Fluid and Coordinated: Yes (@ BUE) Fine Motor Movements are Fluid and Coordinated: No (mildly impaired by Dupytren's contractures, bilateral hands (4th-5th digits))   Motor  Motor Motor: Within Functional Limits Motor - Skilled Clinical Observations: Mild R-sided weakness due to h/o CVA, but functional strength noted   Mobility  Bed Mobility Sitting - Scoot to Edge of Bed: 6: Modified independent (Device/Increase time) Transfers Transfers: Sit to Stand;Stand to Sit Sit to Stand: 6: Modified independent (Device/Increase time) Stand to Sit: 6: Modified independent (Device/Increase time)   Trunk/Postural Assessment  Cervical Assessment Cervical Assessment: Within Functional Limits Thoracic Assessment Thoracic Assessment: Within Functional Limits Lumbar Assessment Lumbar Assessment: Within Functional Limits Postural Control Postural Control: Within Functional Limits   Balance Balance Balance Assessed: Yes Dynamic Sitting Balance Dynamic Sitting - Level of Assistance: 7: Independent Static Standing Balance Static Standing - Balance Support: Bilateral upper extremity supported;During functional  activity Static Standing - Level of Assistance: 5: Stand by assistance Dynamic Standing Balance Dynamic Standing - Balance Support: Bilateral upper extremity supported Dynamic Standing - Level of Assistance: 5: Stand by assistance   Extremity/Trunk Assessment RUE Assessment RUE Assessment: Exceptions to [facility] RUE AROM (degrees) Overall  AROM Right Upper Extremity: Due to premorbid status (hx of CVA with right hemiparesis, mild residual deficits wtih shoulder flexion) LUE Assessment LUE Assessment: Within Functional Limits  Lavanna Rog 03/02/2015, 3:05 PM

## 2015-03-01 NOTE — Progress Notes (Signed)
Occupational Therapy Session Note  Patient Details  Name: Tanner [name] MRN: [id number] Date of Birth: 06-Dec-1955  Today's Date: 03/01/2015 OT Individual Time: 0730-0830 OT Individual Time Calculation (min): 60 min    Short Term Goals: Week 1:  OT Short Term Goal 1 (Week 1): STG=LTG due short anticipated LOS  Skilled Therapeutic Interventions/Progress Updates: ADL-retraining with focus on performance of self-care at discharge level (aka Grad Day).   Pt progressed from bed mobility, to toilet and to shower at overall modified independent level using RW, reacher and extra time.   Pt requires setup for bathing to apply water barrier for shower level bathing however pt reports plan to bathe at sink daily with supervision for use of tub bench only occasionally.  Daughter abel to assist with tub transfer as trained.        Therapy Documentation Precautions:  Precautions Precautions: Fall Required Braces or Orthoses: Knee Immobilizer - Right Knee Immobilizer - Right: On at all times Restrictions Weight Bearing Restrictions: Yes RLE Weight Bearing: Non weight bearing General:   Vital Signs: Therapy Vitals Pulse Rate: 71 BP: (!) 145/80 mmHg Pain:   ADL:   Exercises:   Other Treatments:    Function:   Eating Eating   Eating Assist Level: No help, No cues           Grooming Oral Care,Brush Teeth, Clean Dentures Activity:             Wash, Rinse, Dry Face Activity          Wash, Rinse, Dry Hands Activity          Brush, Comb Hair Activity        Shave Activity          Apply Makeup Activity                                                             Bathing Bathing position   Position: Shower  Bathing parts Body parts bathed by patient: Right arm;Left arm;Chest;Abdomen;Front perineal area;Buttocks;Right upper leg;Left upper leg;Left lower leg    Bathing assist Assist Level: Supervision or verbal cues       Upper Body Dressing/Undressing Upper body  dressing   What is the patient wearing?: Pull over shirt/dress     Pull over shirt/dress - Perfomed by patient: Thread/unthread right sleeve;Thread/unthread left sleeve;Put head through opening;Pull shirt over trunk          Upper body assist Assist Level: No help, No cues   Set up : To obtain clothing/put away   Lower Body Dressing/Undressing Lower body dressing   What is the patient wearing?: Non-skid slipper socks;Pants Underwear - Performed by patient: Thread/unthread left underwear leg;Pull underwear up/down Underwear - Performed by helper: Thread/unthread right underwear leg Pants- Performed by patient: Thread/unthread left pants leg;Pull pants up/down;Fasten/unfasten pants Pants- Performed by helper: Thread/unthread right pants leg Non-skid slipper socks- Performed by patient: Don/doff left sock Non-skid slipper socks- Performed by helper: Don/doff right sock                  Lower body assist Assist Level: Touching or steadying assistance (Pt > 75%)   Set up : To obtain clothing/put away   Toileting Toileting   Toileting steps completed by patient: Adjust clothing prior to toileting;Performs  perineal hygiene;Adjust clothing after toileting      Toileting assist      Bed Mobility Roll left and right activity   Assist level: Set up only  Sit to lying activity   Assist level: No Help, No cues  Lying to sitting activity      Mobility details     Transfers Sit to stand transfer   Sit to stand assist level: No help, no cues, assistive device, takes more than a reasonable amount of time    Chair/bed transfer   Chair/bed transfer method: Stand pivot Chair/bed transfer assist level: No Help, no cues, assistive device, takes more than a reasonable amount of time Chair/bed transfer assistive device: Public librarian transfer assistive device: Bedside commode;Walker Assist level to bedside commode (at bedside): No Help, no cues, assistive  device, takes more than a reasonable amount of time Assist level from bedside commode (at bedside): No Help, no cues, assistive device, takes more than a reasonable amount of time        Tub/shower transfer   Tub/shower assistive device: Shower chair;Walk in shower;Walker;Grab bars   Assist level into tub: No Help, no cues, assistive device, takes more than a reasonable amount of time Assist level out of tub: No Help, no cues, assistive device, takes more than a reasonable amount of time   Cognition Comprehension Comprehension assist level: Follows complex conversation/direction with no assist  Expression Expression assist level: Expresses complex ideas: With no assist  Social Interaction Social Interaction assist level: Interacts appropriately with others - No medications needed.  Problem Solving Problem solving assist level: Solves complex problems: With extra time  Memory Memory assist level: Complete Independence: No helper    Therapy/Group: Individual Therapy  [place] 03/01/2015, 11:19 AM

## 2015-03-01 NOTE — Progress Notes (Signed)
Physical Therapy Session Note  Patient Details  Name: Tanner [name] MRN: [id number] Date of Birth: April 09, 1956  Today's Date: 03/01/2015 PT Individual Time: 7824-2353 PT Individual Time Calculation (min): 44 min   Short Term Goals: Week 1:  PT Short Term Goal 1 (Week 1): STG=LTG due to LOS, Mod I transfers and short distance gait with RW  Skilled Therapeutic Interventions/Progress Updates:   Pt received in recliner.  Pt participated in grad day activities as well as continued transfer, bed mobility, gait, stair negotiation and w/c mobility as below.  Set up for one more education session with daughter to perform hands on for stair negotiation and w/c management for D/C home.  Equipment ordered and f/u therapy recommendations given.  Pt demonstrated safe MOD I bed <> BSC and w/c transfers; pt made MOD I for transfers and w/c mobility only.  Pt left in bed with SR x 2 and all items within reach.  Therapy Documentation Precautions:  Precautions Precautions: Fall Required Braces or Orthoses: Knee Immobilizer - Right Knee Immobilizer - Right: On at all times Restrictions Weight Bearing Restrictions: Yes RLE Weight Bearing: Non weight bearing Vital Signs: Therapy Vitals Temp: 98.1 F (36.7 C) Temp Source: Oral Pulse Rate: 78 Resp: 16 BP: 117/77 mmHg Patient Position (if appropriate): Lying Oxygen Therapy SpO2: 97 % O2 Device: Not Delivered Pain: Pain Assessment Pain Assessment: 0-10 Pain Score: 2   Function:   Bed Mobility Roll left and right activity   Assist level: No help, No cues, assistive device, takes more than a reasonable amount of time Roll left and right assistive device: Other (leg lifter)  Sit to lying activity   Assist level: No help, No cues, assistive device, takes more than a reasonable amount of time Sit to lying assistive device: Other (leg lifter)  Lying to sitting activity   Assist level: No help, No cues, assistive device, takes more than a reasonable  amount of time Lying to sitting assistive device: Other (leg lifter)  Mobility details  MOD I   Transfers Sit to stand transfer   Sit to stand assist level: No help, no cues, assistive device, takes more than a reasonable amount of time Sit to stand assistive device: Walker  Chair/bed transfer   Chair/bed transfer method: Squat pivot;Stand pivot;Ambulatory Chair/bed transfer assist level: No Help, no cues, assistive device, takes more than a reasonable amount of time Chair/bed transfer assistive device: Technical brewer transfer assistive device: Copywriter, advertising transfer assist level: Supervision or verbal cues    Locomotion Ambulation   Assistive device: Walker-rolling;Orthosis Max distance: 51 Assist level: Supervision or verbal cues  Walk 10 feet activity   Assist level: Supervision or verbal cues  Walk 50 feet with 2 turns activity   Assist level: Supervision or verbal cues  Walk 150 feet activity Walk 150 feet activity did not occur: Safety/medical concerns    Walk 10 feet on uneven surfaces activity Walk 10 feet on uneven surfaces activity did not occur: Safety/medical concerns    Stairs   Stairs assistive device: 1 hand rail;Other (comment);Orthosis (one crutch) Max number of stairs: 4 Stairs assist level: Touching or steadying assistance (Pt > 75%)  Walk up/down 1 step activity     Walk up/down 1 step (curb) assist level: Touching or steadying assistance (Pt > 75%)  Walk up/down 4 steps activity   Walk up/down 4 steps assist level: Touching or steadying assistance (Pt > 75%)  Walk  up/down 12 steps activity Walk up/down 12 steps activity did not occur: Safety/medical concerns    Pick up small objects from floor Pick up small object from the floor (from standing position) activity did not occur: Safety/medical concerns    Wheelchair   Type: Manual Max wheelchair distance: 200 Assist Level: No help, No cues, assistive device,  takes more than reasonable amount of time  Wheel 50 feet with 2 turns activity   Assist Level: No help, No cues, assistive device, takes more than reasonable amount of time  Wheel 150 feet activity   Assist Level: No help, No cues, assistive device, takes more than reasonable amount of time   Cognition Comprehension Comprehension assist level: Follows complex conversation/direction with no assist  Expression Expression assist level: Expresses complex ideas: With no assist  Social Interaction Social Interaction assist level: Interacts appropriately with others - No medications needed.  Problem Solving Problem solving assist level: Solves complex problems: Recognizes & self-corrects  Memory Memory assist level: Complete Independence: No helper    Therapy/Group: Individual Therapy  Tanner [name] [facility] 03/01/2015, 4:43 PM

## 2015-03-01 NOTE — Patient Instructions (Signed)
Ankle Pump   With left leg elevated, gently flex and extend ankle. Move through full range of motion. Avoid pain. Repeat __20__ times per set. Do __1__ sets per session. Do _2___ sessions per day.  http://orth.exer.us/32   Copyright  VHI. All rights reserved.  Strengthening: Straight Leg Raise (Phase 1)   Tighten muscles on front of right thigh, then lift leg _6___ inches from surface, keeping knee locked.  Repeat _15___ times per set. Do __1__ sets per session. Do __2__ sessions per day.  http://orth.exer.us/614   Copyright  VHI. All rights reserved.  Strengthening: Hip Adduction - Isometric   With right leg straight and pillow between your legs, squeeze knees together. Hold _5___ seconds. Repeat __15__ times per set. Do _1___ sets per session. Do __2__ sessions per day.  http://orth.exer.us/612   Copyright  VHI. All rights reserved.  Hip Abduction / Adduction: with Extended Knee (Supine)   Bring left leg out to side and return. Keep knee straight. Repeat __15__ times per set. Do __1__ sets per session. Do __2__ sessions per day.  http://orth.exer.us/680   Copyright  VHI. All rights reserved.  Strengthening: Hamstring Set   With right foot turned in and LEG STRAIGHT, tighten muscles on back of thigh by pushing the entire leg down into bed or pillow. Hold __5__ seconds. Repeat _15___ times per set. Do __1__ sets per session. Do _2___ sessions per day.  http://orth.exer.us/604   Copyright  VHI. All rights reserved.

## 2015-03-01 NOTE — Discharge Summary (Signed)
Discharge summary job # [id number]

## 2015-03-01 NOTE — Progress Notes (Signed)
Subjective/Complaints: Occ shaky feeling no issues overnite  Eating well       ROS- neg SOB, neg CP, neg abd pain.  + RLE pain,  Objective: Vital Signs: Blood pressure 149/78, pulse 65, temperature 97.8 F (36.6 C), temperature source Oral, resp. rate 18, height 5' 10"  (1.778 m), weight 81.45 kg (179 lb 9 oz), SpO2 96 %. No results found. Results for orders placed or performed during the hospital encounter of 02/20/15 (from the past 72 hour(s))  Creatinine, serum     Status: None   Collection Time: 02/27/15  4:55 AM  Result Value Ref Range   Creatinine, Ser 1.07 0.61 - 1.24 mg/dL   GFR calc non Af Amer >60 >60 mL/min   GFR calc Af Amer >60 >60 mL/min    Comment: (NOTE) The eGFR has been calculated using the CKD EPI equation. This calculation has not been validated in all clinical situations. eGFR's persistently <60 mL/min signify possible Chronic Kidney Disease.   CBC     Status: Abnormal   Collection Time: 02/28/15  5:39 AM  Result Value Ref Range   WBC 5.0 4.0 - 10.5 K/uL   RBC 3.28 (L) 4.22 - 5.81 MIL/uL   Hemoglobin 10.6 (L) 13.0 - 17.0 g/dL   HCT 32.3 (L) 39.0 - 52.0 %   MCV 98.5 78.0 - 100.0 fL   MCH 32.3 26.0 - 34.0 pg   MCHC 32.8 30.0 - 36.0 g/dL   RDW 12.6 11.5 - 15.5 %   Platelets 228 150 - 400 K/uL     HEENT: normal Cardio: RRR and no murmur Resp: CTA B/L and unlabored GI: BS positive and NT, ND Extremity:  Pulses positive and No Edema LLE, R calf edemaSkin:   Intact Neuro: Alert/Oriented and Abnormal Motor 5/5 in BUE, 2- R HF, ANkle DF/PF, in knee immob, Left LE 4+/5 Musc/Skel:  Swelling right leg and foot, non tender no erythema Gen NAD Mood/affect no lability or agitation  Assessment/Plan: 1. 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 Conservative care knee brace at all times nonweightbearing which require 3+ hours per day of interdisciplinary therapy in a comprehensive inpatient rehab  setting. Physiatrist is providing close team supervision and 24 hour management of active medical problems listed below. Physiatrist and rehab team continue to assess barriers to discharge/monitor patient progress toward functional and medical goals.   FIM: Function - Bathing Bathing activity did not occur: N/A Position: Sitting EOB Body parts bathed by patient: Right arm, Left arm, Chest, Abdomen, Front perineal area, Buttocks, Left upper leg, Right upper leg, Back Body parts bathed by helper: Buttocks, Right lower leg, Left lower leg Bathing not applicable: Right lower leg, Right upper leg Assist Level: Supervision or verbal cues  Function- Upper Body Dressing/Undressing Upper body dressing/undressing activity did not occur: N/A What is the patient wearing?: Pull over shirt/dress Pull over shirt/dress - Perfomed by patient: Thread/unthread right sleeve, Thread/unthread left sleeve, Put head through opening, Pull shirt over trunk Assist Level: Set up Set up : To obtain clothing/put away Function - Lower Body Dressing/Undressing Lower body dressing/undressing activity did not occur: 0: Wears gown/pajamas-no public clothing What is the patient wearing?: Underwear, Pants, Socks Position: Sitting EOB Underwear - Performed by patient: Thread/unthread right underwear leg, Thread/unthread left underwear leg, Pull underwear up/down Underwear - Performed by helper: Pull underwear up/down Pants- Performed by patient: Thread/unthread left pants leg, Pull pants up/down, Thread/unthread right pants leg  Pants- Performed by helper: Thread/unthread right pants leg Socks - Performed by patient: Don/doff left sock Socks - Performed by helper: Don/doff right sock, Don/doff left sock Shoes - Performed by patient: Don/doff left shoe, Fasten left TED Hose - Performed by helper: Don/doff left TED hose Assist Level: Touching or steadying assistance (Pt > 75%) Set up : Don/doff TED stockings, To obtain  clothing/put away  Function - Toileting Toileting activity did not occur: N/A Toileting steps completed by patient: Adjust clothing prior to toileting, Performs perineal hygiene, Adjust clothing after toileting Toileting steps completed by helper: Performs perineal hygiene Toileting Assistive Devices:  (used BSC) Assist level: Touching or steadying assistance (Pt.75%)  Function - Air cabin crew transfer assistive device: Bedside commode, Walker Assist level to toilet: Supervision or verbal cues Assist level from toilet: Supervision or verbal cues Assist level to bedside commode (at bedside): Supervision or verbal cues Assist level from bedside commode (at bedside): Supervision or verbal cues  Function - Chair/bed transfer Chair/bed transfer method: Stand pivot Chair/bed transfer assist level: Supervision or verbal cues Chair/bed transfer assistive device: Walker Chair/bed transfer details: Verbal cues for sequencing  Function - Locomotion: Wheelchair Will patient use wheelchair at discharge?: Yes Type: Manual Max wheelchair distance: 200 Assist Level: No help, No cues, assistive device, takes more than reasonable amount of time Assist Level: No help, No cues, assistive device, takes more than reasonable amount of time Assist Level: No help, No cues, assistive device, takes more than reasonable amount of time Function - Locomotion: Ambulation Assistive device: Walker-rolling, Orthosis (R knee immobilizer brace) Max distance: 85 Assist level: Touching or steadying assistance (Pt > 75%) (CGA progressing to SBA) Assist level: Touching or steadying assistance (Pt > 75%) Walk 50 feet with 2 turns activity did not occur: Safety/medical concerns Assist level: Touching or steadying assistance (Pt > 75%) Walk 150 feet activity did not occur: Safety/medical concerns Walk 10 feet on uneven surfaces activity did not occur: Safety/medical concerns  Function -  Comprehension Comprehension: Auditory Comprehension assist level: Follows complex conversation/direction with extra time/assistive device  Function - Expression Expression: Verbal Expression assist level: Expresses complex ideas: With extra time/assistive device  Function - Social Interaction Social Interaction assist level: Interacts appropriately with others - No medications needed.  Function - Problem Solving Problem solving assist level: Solves complex 90% of the time/cues < 10% of the time  Function - Memory Memory assist level: More than reasonable amount of time  Medical Problem List and Plan: 1. Functional deficits secondary to right proximal tibia fracture. Conservative care knee brace at all times nonweightbearing 2. DVT Prophylaxis/Anticoagulation: . Monitor for any bleeding episodes.Right post tibial +/- popliteal thrombus,  On  Xarelto for d/c 3. Pain Management: off morphine, now just taking tylenol #3 , most reading 0 , some 5/10    Robaxin 4. Mood/anxiety: Prozac 20 mg daily. Provide emotional support 5. Neuropsych: This patient is capable of making decisions on his own behalf. 6. Skin/Wound Care: Routine skin checks 7. Fluids/Electrolytes/Nutrition: Routine I&O ate 100% meals yest, 787m fluid  8. History of CVA February 2015.mainly RLE residual  Continue aspirin therapy 9. Hypertension. Lopressor 50 mg twice a day. Monitor with increased mobility, HR 65 BP 1937/16in am, systolic in 1967Eduring the day  10. History of prostate cancer. Status post radical prostatectomy 11. Hyperlipidemia. Zocor 12. History of alcohol.over most of withdrawal syndrome, advised pt to have dauhter remove ETOH from home, pt will not be driving for ~~9FY13.  improved intake  90-100% meals 14.  Constipation- will adjust laxative, due to pain meds and immobility LOS (Days) 9 A FACE TO FACE EVALUATION WAS PERFORMED  Agustin Swatek E 03/01/2015, 8:13 AM

## 2015-03-01 NOTE — Progress Notes (Signed)
Physical Therapy Discharge Summary  Patient Details  Name: Cedrick Partain MRN: [id number] Date of Birth: [birth year]  Today's Date: 03/01/2015 PT Individual Time: 1130-1200 PT Individual Time Calculation (min): 30 min    Patient has met 9 of 9 long term goals due to improved activity tolerance, improved balance, improved postural control, increased strength, decreased pain and functional use of  right upper extremity, left upper extremity and left lower extremity.  Patient to discharge at a wheelchair level Modified Independent.   Patient's care partner is independent to provide the necessary physical assistance at discharge.  Reasons goals not met: All goals met  Recommendation:  Patient will benefit from ongoing skilled PT services in home health setting to continue to advance safe functional mobility, address ongoing impairments in UE and LE strength, functional activity tolerance/endurance, standing balance, gait, and minimize fall risk.  Equipment: manual w/c with ELR  Reasons for discharge: treatment goals met and discharge from hospital  Patient/family agrees with progress made and goals achieved: Yes  Therapeutic Interventions: W/C Management: Pt demonstrates mod I w/c propulsion in controlled setting. PT educates daughter Aldona Bar in how to break down and assemble w/c for transport and how to negotiate w/c (without pt in it) up and down stairs for entry/exit from home. Aldona Bar demonstrates understanding.   Gait Training: PT verbally instructs daughter Aldona Bar in how to assist pt/father up/down 4 (6" height) stairs with one rail and one underarm crutch req min A and daughter demonstrate sback understanding.   Pt is safe to d/c home with HHPT for follow up.    PT Discharge Vital Signs Therapy Vitals Temp: 98.1 F (36.7 C) Temp Source: Oral Pulse Rate: 78 Resp: 16 BP: 117/77 mmHg Patient Position (if appropriate): Lying Oxygen Therapy SpO2: 97 % O2 Device: Not  Delivered Pain Pain Assessment Pain Assessment: 0-10 Pain Score: 2  Sensation Sensation Light Touch: Impaired Detail Light Touch Impaired Details: Impaired RLE Coordination Gross Motor Movements are Fluid and Coordinated: Yes Motor  Motor Motor - Discharge Observations: Same as evaluation  Trunk/Postural Assessment  Cervical Assessment Cervical Assessment: Within Functional Limits Thoracic Assessment Thoracic Assessment: Within Functional Limits Lumbar Assessment Lumbar Assessment: Within Functional Limits Postural Control Postural Control: Within Functional Limits  Balance Dynamic Sitting Balance Dynamic Sitting - Level of Assistance: 7: Independent Static Standing Balance Static Standing - Balance Support: Bilateral upper extremity supported;During functional activity Static Standing - Level of Assistance: 5: Stand by assistance Dynamic Standing Balance Dynamic Standing - Balance Support: Bilateral upper extremity supported Dynamic Standing - Level of Assistance: 5: Stand by assistance Extremity Assessment  RLE Strength RLE Overall Strength Comments: UTA MMT due to injury, but 3/5 ankle DF, hip flexion and hip ABD LLE Assessment LLE Assessment: Within Functional Limits  Function:  Toileting Toileting       Bed Mobility Roll left and right activity   Assist level: No help, No cues, assistive device, takes more than a reasonable amount of time  Sit to lying activity   Assist level: No help, No cues, assistive device, takes more than a reasonable amount of time  Lying to sitting activity   Assist level: No help, No cues, assistive device, takes more than a reasonable amount of time  Mobility details     Transfers Sit to stand transfer   Sit to stand assist level: No help, no cues, assistive device, takes more than a reasonable amount of time Sit to stand assistive device: Walker  Chair/bed transfer   Chair/bed transfer method: Squat  pivot;Stand  pivot;Ambulatory Chair/bed transfer assist level: No Help, no cues, assistive device, takes more than a reasonable amount of time Chair/bed transfer assistive device: Walker;Armrests;Orthosis       Physiological scientist transfer assistive device: Copywriter, advertising transfer assist level: Supervision or verbal cues    Locomotion Ambulation     Max distance: 51 Assist level: Supervision or verbal cues  Walk 10 feet activity   Assist level: Supervision or verbal cues  Walk 50 feet with 2 turns activity   Assist level: Supervision or verbal cues  Walk 150 feet activity Walk 150 feet activity did not occur: Safety/medical concerns    Walk 10 feet on uneven surfaces activity Walk 10 feet on uneven surfaces activity did not occur: Safety/medical concerns    Stairs   Stairs assistive device: 1 hand rail;Other (comment);Orthosis (one crutch) Max number of stairs: 4 Stairs assist level: Touching or steadying assistance (Pt > 75%)  Walk up/down 1 step activity     Walk up/down 1 step (curb) assist level: Touching or steadying assistance (Pt > 75%)  Walk up/down 4 steps activity   Walk up/down 4 steps assist level: Touching or steadying assistance (Pt > 75%)  Walk up/down 12 steps activity Walk up/down 12 steps activity did not occur: Safety/medical concerns    Pick up small objects from floor Pick up small object from the floor (from standing position) activity did not occur: Safety/medical concerns    Wheelchair   Type: Manual Max wheelchair distance: 200 Assist Level: No help, No cues, assistive device, takes more than reasonable amount of time  Wheel 50 feet with 2 turns activity   Assist Level: No help, No cues, assistive device, takes more than reasonable amount of time  Wheel 150 feet activity   Assist Level: No help, No cues, assistive device, takes more than reasonable amount of time Pt is able to propel w/c 150 ft. or more and is able to turn around,  maneuver to table, bed or toilet, maneuver on rugs and over door sills, and can negotiate a 3% grade modified independent.    Cognition Comprehension Comprehension assist level: Follows complex conversation/direction with no assist  Expression Expression assist level: Expresses complex ideas: With no assist  Social Interaction Social Interaction assist level: Interacts appropriately with others - No medications needed.  Problem Solving Problem solving assist level: Solves complex problems: Recognizes & self-corrects  Memory Memory assist level: Complete Independence: No helper    Cheria Sadiq M 03/02/2015, 12:16 PM

## 2015-03-01 NOTE — Discharge Summary (Signed)
Tanner [name], Tanner [name]                ACCOUNT NO.:  [id number]  MEDICAL RECORD NO.:  [id number]  LOCATION:  4M10C                        FACILITY:  [facility]  PHYSICIAN:  Tanner [name], M.D.DATE OF BIRTH:  [birth year]  DATE OF ADMISSION:  02/20/2015 DATE OF DISCHARGE:  03/02/2015                              DISCHARGE SUMMARY   DISCHARGE DIAGNOSES: 1. Functional deficits secondary to the right proximal tibia fracture. 2. Right posterior tibial popliteal thrombus, maintained on Xarelto. 3. Pain management. 4. History of cerebrovascular accident, February, 2015. 5. Hypertension. 6. History of prostate cancer. 7. Hyperlipidemia. 8. History of alcohol use.  HISTORY OF PRESENT ILLNESS:  This is a 59 year old right-handed male with history of alcoholism, hypertension, prostate cancer as well as CVA with right-sided weakness, maintained on aspirin, who did receive inpatient rehab services in February, 2015.  He lives with family and assistance as needed, did not use assistive device prior to admission. He presented on February 18, 2015 after a fall trying the open a door with right lower extremity pain.  Noted brief loss of consciousness in the ED becoming diaphoretic.  EKG, normal sinus rhythm.  CT of the head showed no acute changes.  X-rays and imaging of right lower extremity showed a right proximal tibia fracture.  Orthopedic Services, Dr. Rhona [name] consulted.  No surgical intervention.  Knee brace applied. Nonweightbearing x2 months.  Hospital course, pain management. Subcutaneous Lovenox for DVT prophylaxis.  Physical and occupational therapy ongoing.  The patient was admitted for a comprehensive rehab program.  PAST MEDICAL HISTORY:  See discharge diagnoses.  SOCIAL HISTORY:  Lives with family.  Independent with occasional cane prior to admission.  Functional status upon admission to rehab services was minimal assist, +2 physical assist stand pivot transfers, minimal assist sit  to supine and supine to sit, min to mod assist activities of daily living.  PHYSICAL EXAMINATION:  VITAL SIGNS:  Blood pressure 163/90, pulse 80, temperature 98, respirations 20. GENERAL:  This was an alert male, in no acute distress, oriented x3. LUNGS:  Clear to auscultation. CARDIAC:  Regular rate and rhythm. ABDOMEN:  Soft, nontender.  Good bowel sounds.  Brace in place to right lower extremity.  REHABILITATION HOSPITAL COURSE:  The patient was admitted to Inpatient Rehab Services with therapies initiated on a 3-hour daily basis consisting of physical therapy, occupational therapy, and rehabilitation nursing.  The following issues were addressed during the patient's rehabilitation stay.  Pertaining to Tanner [name], right proximal tibia fracture, conservative care, nonweightbearing, knee brace in place, he would follow up with Orthopedic Services.  He had been on Lovenox for DVT prophylaxis.  Venous Doppler studies completed 08/13, could not rule out DVT of popliteal vein due to diminished imaging quality, right lower extremity, noting posterior tibial vein DVT, placed on Xarelto.  Pain management with the use of Robaxin only for muscle spasms as well as Tylenol and scheduled Ultram.  Blood pressures, well controlled.  He had been on aspirin for history of CVA, February, 2015.  Continued on Zocor for hyperlipidemia.  The patient did have a history of alcohol use, received full counseling in regard to cessation of alcohol, and this was also discussed with family.  The patient received weekly collaborative interdisciplinary team conferences to discuss estimated length of stay, family teaching, and any barriers to discharge.  Knee immobilizer in place to right lower extremity.  Required contact guard progressing to standby assist, ambulation, occasional cues.  The patient participated fully with therapies, was educated on lower body dressing using assistive device.  He needed some  encouragement at times to shower. Full family teaching was completed and plan discharged to home.  DISCHARGE MEDICATIONS: 1. Aspirin 325 mg p.o. Daily held while on Xarelto 2. Klonopin 0.5 mg t.i.d. as needed anxiety. 3. Prozac 20 mg p.o. daily. 4. Folic acid 1 mg p.o. daily. 5. Lisinopril 40 mg p.o. daily. 6. Lopressor 50 mg p.o. b.i.d. 7. Multivitamin daily. 8. Protonix 40 mg p.o. daily. 9. Xarelto as directed for DVT. 10.Zocor 20 mg p.o. daily. 11.Ultram 50 mg every 6 hours as needed pain.  DIET:  Regular.  SPECIAL INSTRUCTIONS:  Nonweightbearing right lower extremity with knee immobilizer.  Follow up with Dr. Levin [name], March 08, 2015; Dr. Charlett [name] as needed; Dr. Melrose [name], 2 weeks call for appointment.  Ongoing therapies were arranged as per [employer].     Tanner [name], P.A.   ______________________________ Tanner [name], M.D.    DA/MEDQ  D:  03/01/2015  T:  03/01/2015  Job:  [id number]  cc:   Tanner [name], M.D. Tanner [name], M.D.

## 2015-03-01 NOTE — Progress Notes (Signed)
Physical Therapy Session Note  Patient Details  Name: Tanner [name] MRN: [id number] Date of Birth: September 19, 1955  Today's Date: 03/01/2015 PT Individual Time: 0835-1000 PT Individual Time Calculation (min): 85 min   Short Term Goals: Week 1:  PT Short Term Goal 1 (Week 1): STG=LTG due to LOS, Mod I transfers and short distance gait with RW  Skilled Therapeutic Interventions/Progress Updates:    Patient in wheelchair propelled independent on unit.  Sit to stand transfers to walker mod independent, patient attempted stair negotiation at 6" steps with crutch and rail, but unable to perform safely with min assist due to right UE giving away and min assist to prevent a fall, therefore negotiated 4 5" steps with rail and crutch min assist increased time and cues for technique.  Patient performed sit <>supine to mat, and then bed in ADL apartment, no assist and performed newly constructed HEP as noted below and issued handout.  Patient ambulated with RW and close supervision x 60' in controlled environment and 22' around obstacles and narrow openings for simulated household environment.  Patient reported increased difficulty with walker today due to right arm weakness and pain, therefore kinesiotex tape reapplied with "I" strip and off the paper tension distal to proximal over wrist extensor near insertion.  Educated patient about "tennis elbow" braces and how they can help with pain as well as continued to encourage ice and heat.  Patient independent back to room via wheelchair.  Therapy Documentation Precautions:  Precautions Precautions: Fall Required Braces or Orthoses: Knee Immobilizer - Right Knee Immobilizer - Right: On at all times Restrictions Weight Bearing Restrictions: Yes RLE Weight Bearing: Non weight bearing Pain: Pain Assessment Pain Assessment: 0-10 Pain Score: 7  Pain Type: Acute pain Pain Location: Knee Pain Orientation: Right Pain Descriptors / Indicators: Aching Pain Onset:  On-going Pain Intervention(s): RN made aware 2nd Pain Site Pain Score: 5 Pain Type: Acute pain Pain Location: Elbow Pain Orientation: Right Pain Descriptors / Indicators: Sore Pain Onset: With Activity Pain Intervention(s): Rest (kinesiotape applied) Exercises: General Exercises - Lower Extremity Ankle Circles/Pumps: AROM;20 reps;Supine Hip ABduction/ADduction: AROM;15 reps;Supine Straight Leg Raises: AROM;15 reps;Supine Other Exercises Other Exercises: isometric hip adduction 15 reps 5 sec hold Other Exercises: isometric hip extension 15 reps 5 sec hold  Function:  Toileting Toileting       Bed Mobility Roll left and right activity   Assist level: Set up only  Sit to lying activity   Assist level: No Help, No cues  Lying to sitting activity   Assist level: No Help, No cues  Mobility details     Transfers Sit to stand transfer   Sit to stand assist level: No help, no cues, assistive device, takes more than a reasonable amount of time    Chair/bed transfer   Chair/bed transfer method: Stand pivot Chair/bed transfer assist level: No Help, no cues, assistive device, takes more than a reasonable amount of time Chair/bed transfer assistive device: Walker;Armrests;Orthosis (KI R LE)       Scientist, forensic transfer assistive device: Building surveyor transfer assistive device: Walker;Orthosis (R LE KI) Car transfer assist level: Supervision or verbal cues    Locomotion Ambulation   Assistive device: Walker-rolling Max distance: 55 Assist level: Supervision or verbal cues  Walk 10 feet activity   Assist level: Supervision or verbal cues  Walk 50 feet with 2 turns activity   Assist level: Supervision or verbal cues  Walk  150 feet activity      Walk 10 feet on uneven surfaces activity      Stairs   Stairs assistive device: 1 hand rail;Other (comment) (one crutch) Max number of stairs: 4 Stairs assist level: Touching or  steadying assistance (Pt > 75%)  Walk up/down 1 step activity        Walk up/down 4 steps activity   Walk up/down 4 steps assist level: Touching or steadying assistance (Pt > 75%)  Walk up/down 12 steps activity      Pick up small objects from floor      Wheelchair   Type: Manual Max wheelchair distance: 200 Assist Level: No help, No cues, assistive device, takes more than reasonable amount of time  Wheel 50 feet with 2 turns activity   Assist Level: No help, No cues, assistive device, takes more than reasonable amount of time  Wheel 150 feet activity   Assist Level: No help, No cues, assistive device, takes more than reasonable amount of time   Cognition Comprehension Comprehension assist level: Follows complex conversation/direction with no assist  Expression Expression assist level: Expresses complex ideas: With no assist  Social Interaction Social Interaction assist level: Interacts appropriately with others - No medications needed.  Problem Solving Problem solving assist level: Solves complex problems: With extra time  Memory Memory assist level: Complete Independence: No helper    Therapy/Group: Individual Therapy  [place], [place] 03/01/2015  03/01/2015, 12:35 PM

## 2015-03-02 ENCOUNTER — Ambulatory Visit (HOSPITAL_COMMUNITY): Payer: Self-pay | Admitting: Physical Therapy

## 2015-03-02 MED ORDER — ADULT MULTIVITAMIN W/MINERALS CH: 1.0000 | ORAL_TABLET | Freq: Every day | ORAL | Status: DC

## 2015-03-02 MED ORDER — OMEPRAZOLE 40 MG PO CPDR: 40.0000 mg | DELAYED_RELEASE_CAPSULE | Freq: Two times a day (BID) | ORAL | Status: DC

## 2015-03-02 MED ORDER — TRAMADOL HCL 50 MG PO TABS: 50.0000 mg | ORAL_TABLET | Freq: Four times a day (QID) | ORAL | Status: DC | PRN

## 2015-03-02 MED ORDER — SIMVASTATIN 20 MG PO TABS: 20.0000 mg | ORAL_TABLET | Freq: Every day | ORAL | Status: DC

## 2015-03-02 MED ORDER — RIVAROXABAN 15 MG PO TABS: ORAL_TABLET | ORAL | Status: DC

## 2015-03-02 MED ORDER — ACETAMINOPHEN-CODEINE #3 300-30 MG PO TABS: 1.0000 | ORAL_TABLET | ORAL | Status: DC | PRN

## 2015-03-02 MED ORDER — LISINOPRIL 40 MG PO TABS: 40.0000 mg | ORAL_TABLET | Freq: Every day | ORAL | Status: DC

## 2015-03-02 MED ORDER — TRAZODONE HCL 50 MG PO TABS: 50.0000 mg | ORAL_TABLET | Freq: Every evening | ORAL | Status: DC | PRN

## 2015-03-02 MED ORDER — METOPROLOL TARTRATE 50 MG PO TABS: 50.0000 mg | ORAL_TABLET | Freq: Two times a day (BID) | ORAL | Status: DC

## 2015-03-02 MED ORDER — RIVAROXABAN 20 MG PO TABS: ORAL_TABLET | ORAL | Status: DC

## 2015-03-02 MED ORDER — CLONAZEPAM 0.5 MG PO TABS: 0.5000 mg | ORAL_TABLET | Freq: Three times a day (TID) | ORAL | Status: DC | PRN

## 2015-03-02 MED ORDER — FOLIC ACID 1 MG PO TABS: 1.0000 mg | ORAL_TABLET | Freq: Every day | ORAL | Status: DC

## 2015-03-02 MED ORDER — FLUOXETINE HCL 20 MG PO CAPS: 20.0000 mg | ORAL_CAPSULE | Freq: Every day | ORAL | Status: DC

## 2015-03-02 NOTE — Progress Notes (Signed)
Subjective/Complaints: Near fall on steps, daughter coming in forfamily training prior to d/c this am     ROS- neg SOB, neg CP, neg abd pain.  + RLE pain,  Objective: Vital Signs: Blood pressure 123/73, pulse 66, temperature 97.7 F (36.5 C), temperature source Oral, resp. rate 17, height '5\' 10"'$  (1.778 m), weight 81.45 kg (179 lb 9 oz), SpO2 95 %. No results found. Results for orders placed or performed during the hospital encounter of 02/20/15 (from the past 72 hour(s))  CBC     Status: Abnormal   Collection Time: 02/28/15  5:39 AM  Result Value Ref Range   WBC 5.0 4.0 - 10.5 K/uL   RBC 3.28 (L) 4.22 - 5.81 MIL/uL   Hemoglobin 10.6 (L) 13.0 - 17.0 g/dL   HCT 32.3 (L) 39.0 - 52.0 %   MCV 98.5 78.0 - 100.0 fL   MCH 32.3 26.0 - 34.0 pg   MCHC 32.8 30.0 - 36.0 g/dL   RDW 12.6 11.5 - 15.5 %   Platelets 228 150 - 400 K/uL  Occult blood card to lab, stool     Status: None   Collection Time: 03/01/15  8:14 PM  Result Value Ref Range   Fecal Occult Bld NEGATIVE NEGATIVE     HEENT: normal Cardio: RRR and no murmur Resp: CTA B/L and unlabored GI: BS positive and NT, ND Extremity:  Pulses positive and No Edema LLE, R calf edemaSkin:   Intact Neuro: Alert/Oriented and Abnormal Motor 5/5 in BUE, 2- R HF, ANkle DF/PF, in knee immob, Left LE 4+/5 Musc/Skel:  Swelling right leg and foot, non tender no erythema Gen NAD Mood/affect no lability or agitation, no sweats or shaking  Assessment/Plan: 1. 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 Conservative care knee brace at all times nonweightbearing  Stable for D/C today F/u PCP in 1-2 weeks F/u Ortho 3 weeks See D/C summary See D/C instructions No Driving until cleared by Ortho No ETOH FIM: Function - Bathing Bathing activity did not occur: N/A Position: Shower Body parts bathed by patient: Right arm, Left arm, Chest, Abdomen, Front perineal area, Buttocks, Right  upper leg, Left upper leg, Left lower leg Body parts bathed by helper: Buttocks, Right lower leg, Left lower leg Bathing not applicable: Back, Right lower leg Assist Level: Supervision or verbal cues  Function- Upper Body Dressing/Undressing Upper body dressing/undressing activity did not occur: N/A What is the patient wearing?: Pull over shirt/dress Pull over shirt/dress - Perfomed by patient: Thread/unthread right sleeve, Thread/unthread left sleeve, Put head through opening, Pull shirt over trunk Assist Level: No help, No cues Set up : To obtain clothing/put away Function - Lower Body Dressing/Undressing Lower body dressing/undressing activity did not occur: 0: Wears gown/pajamas-no public clothing What is the patient wearing?: Non-skid slipper socks, Pants Position: Other (comment) (BSC) Underwear - Performed by patient: Thread/unthread left underwear leg, Pull underwear up/down Underwear - Performed by helper: Thread/unthread right underwear leg Pants- Performed by patient: Thread/unthread left pants leg, Pull pants up/down, Fasten/unfasten pants Pants- Performed by helper: Thread/unthread right pants leg Non-skid slipper socks- Performed by patient: Don/doff left sock Non-skid slipper socks- Performed by helper: Don/doff right sock Socks - Performed by patient: Don/doff left sock Socks - Performed by helper: Don/doff right sock, Don/doff left sock Shoes - Performed by patient: Don/doff left shoe, Fasten left TED Hose - Performed by helper: Don/doff left TED hose Assist Level: Touching or  steadying assistance (Pt > 75%) Set up : To obtain clothing/put away  Function - Toileting Toileting activity did not occur: N/A Toileting steps completed by patient: Performs perineal hygiene, Adjust clothing prior to toileting, Adjust clothing after toileting Toileting steps completed by helper: Performs perineal hygiene Toileting Assistive Devices:  (used BSC) Assist level: Touching or  steadying assistance (Pt.75%)  Function - Toilet Transfers Toilet transfer assistive device: Bedside commode Assist level to toilet: Supervision or verbal cues Assist level from toilet: Supervision or verbal cues Assist level to bedside commode (at bedside): No Help, no cues, assistive device, takes more than a reasonable amount of time Assist level from bedside commode (at bedside): No Help, no cues, assistive device, takes more than a reasonable amount of time  Function - Chair/bed transfer Chair/bed transfer method: Squat pivot, Stand pivot, Ambulatory Chair/bed transfer assist level: No Help, no cues, assistive device, takes more than a reasonable amount of time Chair/bed transfer assistive device: Walker, Armrests, Orthosis Chair/bed transfer details: Verbal cues for sequencing  Function - Locomotion: Wheelchair Will patient use wheelchair at discharge?: Yes Type: Manual Max wheelchair distance: 200 Assist Level: No help, No cues, assistive device, takes more than reasonable amount of time Assist Level: No help, No cues, assistive device, takes more than reasonable amount of time Assist Level: No help, No cues, assistive device, takes more than reasonable amount of time Function - Locomotion: Ambulation Assistive device: Walker-rolling, Orthosis Max distance: 51 Assist level: Supervision or verbal cues Assist level: Supervision or verbal cues Walk 50 feet with 2 turns activity did not occur: Safety/medical concerns Assist level: Supervision or verbal cues Walk 150 feet activity did not occur: Safety/medical concerns Walk 10 feet on uneven surfaces activity did not occur: Safety/medical concerns  Function - Comprehension Comprehension: Auditory Comprehension assist level: Follows complex conversation/direction with no assist  Function - Expression Expression: Verbal Expression assist level: Expresses complex ideas: With no assist  Function - Social Interaction Social  Interaction assist level: Interacts appropriately with others - No medications needed.  Function - Problem Solving Problem solving assist level: Solves complex problems: Recognizes & self-corrects  Function - Memory Memory assist level: Complete Independence: No helper  Medical Problem List and Plan: 1. Functional deficits secondary to right proximal tibia fracture. Conservative care knee brace at all times nonweightbearing 2. DVT Prophylaxis/Anticoagulation: . Monitor for any bleeding episodes.Right post tibial +/- popliteal thrombus,  On  Xarelto for d/c 3. Pain Management: off morphine, now just taking tylenol per pt request   Robaxin 4. Mood/anxiety: Prozac 20 mg daily. Provide emotional support 5. Neuropsych: This patient is capable of making decisions on his own behalf. 6. Skin/Wound Care: Routine skin checks 7. Fluids/Electrolytes/Nutrition: Routine I&O  8. History of CVA February 2015.mainly RLE residual  Continue aspirin therapy 9. Hypertension. Lopressor 50 mg twice a day. Monitor with increased mobility,  10. History of prostate cancer. Status post radical prostatectomy 11. Hyperlipidemia. Zocor 12. History of alcohol.over most of withdrawal syndrome, advised pt to have dauhter remove ETOH from home, pt will not be driving for ~8BV, needs ortho clearance   LOS (Days) 10 A FACE TO FACE EVALUATION WAS PERFORMED  KIRSTEINS,ANDREW E 03/02/2015, 8:04 AM

## 2015-03-03 DIAGNOSIS — F419 Anxiety disorder, unspecified: Secondary | ICD-10-CM | POA: Diagnosis not present

## 2015-03-03 DIAGNOSIS — K219 Gastro-esophageal reflux disease without esophagitis: Secondary | ICD-10-CM | POA: Diagnosis not present

## 2015-03-03 DIAGNOSIS — I10 Essential (primary) hypertension: Secondary | ICD-10-CM | POA: Diagnosis not present

## 2015-03-03 DIAGNOSIS — Z8673 Personal history of transient ischemic attack (TIA), and cerebral infarction without residual deficits: Secondary | ICD-10-CM | POA: Diagnosis not present

## 2015-03-03 DIAGNOSIS — Z87891 Personal history of nicotine dependence: Secondary | ICD-10-CM | POA: Diagnosis not present

## 2015-03-03 DIAGNOSIS — S82101D Unspecified fracture of upper end of right tibia, subsequent encounter for closed fracture with routine healing: Secondary | ICD-10-CM | POA: Diagnosis not present

## 2015-03-03 DIAGNOSIS — I82401 Acute embolism and thrombosis of unspecified deep veins of right lower extremity: Secondary | ICD-10-CM | POA: Diagnosis not present

## 2015-03-03 NOTE — Progress Notes (Signed)
Social Work  Discharge Note  The overall goal for the admission was met for:   Discharge location: Yes - home with daughter and ex-wife to provide intermittent assist  Length of Stay: Yes - 10 days  Discharge activity level: Yes - modified independent - min assist overall  Home/community participation: Yes  Services provided included: MD, RD, PT, OT, SLP, RN, TR, Pharmacy and Phil [place]: Medicare  Follow-up services arranged: Home Health: PT, OT via [facility], DME: 18x18 lightweight w/c with ELRs, cushion, tub bench all via [facility] and Patient/Family has no preference for HH/DME agencies  Comments (or additional information):  Patient/Family verbalized understanding of follow-up arrangements: Yes  Individual responsible for coordination of the follow-up plan: pt  Confirmed correct DME delivered: Elbie Statzer 03/03/2015    Bradford Cazier

## 2015-03-05 DIAGNOSIS — K219 Gastro-esophageal reflux disease without esophagitis: Secondary | ICD-10-CM | POA: Diagnosis not present

## 2015-03-05 DIAGNOSIS — Z87891 Personal history of nicotine dependence: Secondary | ICD-10-CM | POA: Diagnosis not present

## 2015-03-05 DIAGNOSIS — I10 Essential (primary) hypertension: Secondary | ICD-10-CM | POA: Diagnosis not present

## 2015-03-05 DIAGNOSIS — S82101D Unspecified fracture of upper end of right tibia, subsequent encounter for closed fracture with routine healing: Secondary | ICD-10-CM | POA: Diagnosis not present

## 2015-03-05 DIAGNOSIS — F419 Anxiety disorder, unspecified: Secondary | ICD-10-CM | POA: Diagnosis not present

## 2015-03-05 DIAGNOSIS — I82401 Acute embolism and thrombosis of unspecified deep veins of right lower extremity: Secondary | ICD-10-CM | POA: Diagnosis not present

## 2015-03-08 DIAGNOSIS — I10 Essential (primary) hypertension: Secondary | ICD-10-CM | POA: Diagnosis not present

## 2015-03-08 DIAGNOSIS — Z87891 Personal history of nicotine dependence: Secondary | ICD-10-CM | POA: Diagnosis not present

## 2015-03-08 DIAGNOSIS — S82101D Unspecified fracture of upper end of right tibia, subsequent encounter for closed fracture with routine healing: Secondary | ICD-10-CM | POA: Diagnosis not present

## 2015-03-08 DIAGNOSIS — F419 Anxiety disorder, unspecified: Secondary | ICD-10-CM | POA: Diagnosis not present

## 2015-03-08 DIAGNOSIS — K219 Gastro-esophageal reflux disease without esophagitis: Secondary | ICD-10-CM | POA: Diagnosis not present

## 2015-03-08 DIAGNOSIS — I82401 Acute embolism and thrombosis of unspecified deep veins of right lower extremity: Secondary | ICD-10-CM | POA: Diagnosis not present

## 2015-03-09 DIAGNOSIS — I10 Essential (primary) hypertension: Secondary | ICD-10-CM | POA: Diagnosis not present

## 2015-03-09 DIAGNOSIS — F419 Anxiety disorder, unspecified: Secondary | ICD-10-CM | POA: Diagnosis not present

## 2015-03-09 DIAGNOSIS — Z87891 Personal history of nicotine dependence: Secondary | ICD-10-CM | POA: Diagnosis not present

## 2015-03-09 DIAGNOSIS — Z23 Encounter for immunization: Secondary | ICD-10-CM | POA: Diagnosis not present

## 2015-03-09 DIAGNOSIS — I82401 Acute embolism and thrombosis of unspecified deep veins of right lower extremity: Secondary | ICD-10-CM | POA: Diagnosis not present

## 2015-03-09 DIAGNOSIS — K219 Gastro-esophageal reflux disease without esophagitis: Secondary | ICD-10-CM | POA: Diagnosis not present

## 2015-03-09 DIAGNOSIS — S82101D Unspecified fracture of upper end of right tibia, subsequent encounter for closed fracture with routine healing: Secondary | ICD-10-CM | POA: Diagnosis not present

## 2015-03-10 DIAGNOSIS — S82101D Unspecified fracture of upper end of right tibia, subsequent encounter for closed fracture with routine healing: Secondary | ICD-10-CM | POA: Diagnosis not present

## 2015-03-10 DIAGNOSIS — F419 Anxiety disorder, unspecified: Secondary | ICD-10-CM | POA: Diagnosis not present

## 2015-03-10 DIAGNOSIS — Z87891 Personal history of nicotine dependence: Secondary | ICD-10-CM | POA: Diagnosis not present

## 2015-03-10 DIAGNOSIS — I10 Essential (primary) hypertension: Secondary | ICD-10-CM | POA: Diagnosis not present

## 2015-03-10 DIAGNOSIS — I82401 Acute embolism and thrombosis of unspecified deep veins of right lower extremity: Secondary | ICD-10-CM | POA: Diagnosis not present

## 2015-03-10 DIAGNOSIS — K219 Gastro-esophageal reflux disease without esophagitis: Secondary | ICD-10-CM | POA: Diagnosis not present

## 2015-03-12 DIAGNOSIS — F419 Anxiety disorder, unspecified: Secondary | ICD-10-CM | POA: Diagnosis not present

## 2015-03-12 DIAGNOSIS — I82401 Acute embolism and thrombosis of unspecified deep veins of right lower extremity: Secondary | ICD-10-CM | POA: Diagnosis not present

## 2015-03-12 DIAGNOSIS — S82101D Unspecified fracture of upper end of right tibia, subsequent encounter for closed fracture with routine healing: Secondary | ICD-10-CM | POA: Diagnosis not present

## 2015-03-12 DIAGNOSIS — K219 Gastro-esophageal reflux disease without esophagitis: Secondary | ICD-10-CM | POA: Diagnosis not present

## 2015-03-12 DIAGNOSIS — Z87891 Personal history of nicotine dependence: Secondary | ICD-10-CM | POA: Diagnosis not present

## 2015-03-12 DIAGNOSIS — I10 Essential (primary) hypertension: Secondary | ICD-10-CM | POA: Diagnosis not present

## 2015-03-16 DIAGNOSIS — K219 Gastro-esophageal reflux disease without esophagitis: Secondary | ICD-10-CM | POA: Diagnosis not present

## 2015-03-16 DIAGNOSIS — Z87891 Personal history of nicotine dependence: Secondary | ICD-10-CM | POA: Diagnosis not present

## 2015-03-16 DIAGNOSIS — I82401 Acute embolism and thrombosis of unspecified deep veins of right lower extremity: Secondary | ICD-10-CM | POA: Diagnosis not present

## 2015-03-16 DIAGNOSIS — S82101D Unspecified fracture of upper end of right tibia, subsequent encounter for closed fracture with routine healing: Secondary | ICD-10-CM | POA: Diagnosis not present

## 2015-03-16 DIAGNOSIS — I10 Essential (primary) hypertension: Secondary | ICD-10-CM | POA: Diagnosis not present

## 2015-03-16 DIAGNOSIS — F419 Anxiety disorder, unspecified: Secondary | ICD-10-CM | POA: Diagnosis not present

## 2015-03-17 DIAGNOSIS — S82101D Unspecified fracture of upper end of right tibia, subsequent encounter for closed fracture with routine healing: Secondary | ICD-10-CM | POA: Diagnosis not present

## 2015-03-17 DIAGNOSIS — I82401 Acute embolism and thrombosis of unspecified deep veins of right lower extremity: Secondary | ICD-10-CM | POA: Diagnosis not present

## 2015-03-17 DIAGNOSIS — F419 Anxiety disorder, unspecified: Secondary | ICD-10-CM | POA: Diagnosis not present

## 2015-03-17 DIAGNOSIS — Z87891 Personal history of nicotine dependence: Secondary | ICD-10-CM | POA: Diagnosis not present

## 2015-03-17 DIAGNOSIS — I10 Essential (primary) hypertension: Secondary | ICD-10-CM | POA: Diagnosis not present

## 2015-03-17 DIAGNOSIS — K219 Gastro-esophageal reflux disease without esophagitis: Secondary | ICD-10-CM | POA: Diagnosis not present

## 2015-03-18 DIAGNOSIS — Z87891 Personal history of nicotine dependence: Secondary | ICD-10-CM | POA: Diagnosis not present

## 2015-03-18 DIAGNOSIS — K219 Gastro-esophageal reflux disease without esophagitis: Secondary | ICD-10-CM | POA: Diagnosis not present

## 2015-03-18 DIAGNOSIS — S82101D Unspecified fracture of upper end of right tibia, subsequent encounter for closed fracture with routine healing: Secondary | ICD-10-CM | POA: Diagnosis not present

## 2015-03-18 DIAGNOSIS — I82401 Acute embolism and thrombosis of unspecified deep veins of right lower extremity: Secondary | ICD-10-CM | POA: Diagnosis not present

## 2015-03-18 DIAGNOSIS — F419 Anxiety disorder, unspecified: Secondary | ICD-10-CM | POA: Diagnosis not present

## 2015-03-18 DIAGNOSIS — I10 Essential (primary) hypertension: Secondary | ICD-10-CM | POA: Diagnosis not present

## 2015-03-19 DIAGNOSIS — I82401 Acute embolism and thrombosis of unspecified deep veins of right lower extremity: Secondary | ICD-10-CM | POA: Diagnosis not present

## 2015-03-19 DIAGNOSIS — I10 Essential (primary) hypertension: Secondary | ICD-10-CM | POA: Diagnosis not present

## 2015-03-19 DIAGNOSIS — Z87891 Personal history of nicotine dependence: Secondary | ICD-10-CM | POA: Diagnosis not present

## 2015-03-19 DIAGNOSIS — S82101D Unspecified fracture of upper end of right tibia, subsequent encounter for closed fracture with routine healing: Secondary | ICD-10-CM | POA: Diagnosis not present

## 2015-03-19 DIAGNOSIS — F419 Anxiety disorder, unspecified: Secondary | ICD-10-CM | POA: Diagnosis not present

## 2015-03-19 DIAGNOSIS — K219 Gastro-esophageal reflux disease without esophagitis: Secondary | ICD-10-CM | POA: Diagnosis not present

## 2015-03-22 DIAGNOSIS — M25561 Pain in right knee: Secondary | ICD-10-CM | POA: Diagnosis not present

## 2015-03-23 DIAGNOSIS — S82101D Unspecified fracture of upper end of right tibia, subsequent encounter for closed fracture with routine healing: Secondary | ICD-10-CM | POA: Diagnosis not present

## 2015-03-23 DIAGNOSIS — F419 Anxiety disorder, unspecified: Secondary | ICD-10-CM | POA: Diagnosis not present

## 2015-03-23 DIAGNOSIS — I82401 Acute embolism and thrombosis of unspecified deep veins of right lower extremity: Secondary | ICD-10-CM | POA: Diagnosis not present

## 2015-03-23 DIAGNOSIS — I10 Essential (primary) hypertension: Secondary | ICD-10-CM | POA: Diagnosis not present

## 2015-03-23 DIAGNOSIS — Z87891 Personal history of nicotine dependence: Secondary | ICD-10-CM | POA: Diagnosis not present

## 2015-03-23 DIAGNOSIS — K219 Gastro-esophageal reflux disease without esophagitis: Secondary | ICD-10-CM | POA: Diagnosis not present

## 2015-03-25 DIAGNOSIS — S82101D Unspecified fracture of upper end of right tibia, subsequent encounter for closed fracture with routine healing: Secondary | ICD-10-CM | POA: Diagnosis not present

## 2015-03-25 DIAGNOSIS — Z87891 Personal history of nicotine dependence: Secondary | ICD-10-CM | POA: Diagnosis not present

## 2015-03-25 DIAGNOSIS — I10 Essential (primary) hypertension: Secondary | ICD-10-CM | POA: Diagnosis not present

## 2015-03-25 DIAGNOSIS — I82401 Acute embolism and thrombosis of unspecified deep veins of right lower extremity: Secondary | ICD-10-CM | POA: Diagnosis not present

## 2015-03-25 DIAGNOSIS — G459 Transient cerebral ischemic attack, unspecified: Secondary | ICD-10-CM | POA: Diagnosis not present

## 2015-03-25 DIAGNOSIS — K219 Gastro-esophageal reflux disease without esophagitis: Secondary | ICD-10-CM | POA: Diagnosis not present

## 2015-03-25 DIAGNOSIS — F419 Anxiety disorder, unspecified: Secondary | ICD-10-CM | POA: Diagnosis not present

## 2015-03-30 DIAGNOSIS — Z87891 Personal history of nicotine dependence: Secondary | ICD-10-CM | POA: Diagnosis not present

## 2015-03-30 DIAGNOSIS — I82401 Acute embolism and thrombosis of unspecified deep veins of right lower extremity: Secondary | ICD-10-CM | POA: Diagnosis not present

## 2015-03-30 DIAGNOSIS — K219 Gastro-esophageal reflux disease without esophagitis: Secondary | ICD-10-CM | POA: Diagnosis not present

## 2015-03-30 DIAGNOSIS — S82101D Unspecified fracture of upper end of right tibia, subsequent encounter for closed fracture with routine healing: Secondary | ICD-10-CM | POA: Diagnosis not present

## 2015-03-30 DIAGNOSIS — I10 Essential (primary) hypertension: Secondary | ICD-10-CM | POA: Diagnosis not present

## 2015-03-30 DIAGNOSIS — F419 Anxiety disorder, unspecified: Secondary | ICD-10-CM | POA: Diagnosis not present

## 2015-04-01 DIAGNOSIS — K219 Gastro-esophageal reflux disease without esophagitis: Secondary | ICD-10-CM | POA: Diagnosis not present

## 2015-04-01 DIAGNOSIS — I82401 Acute embolism and thrombosis of unspecified deep veins of right lower extremity: Secondary | ICD-10-CM | POA: Diagnosis not present

## 2015-04-01 DIAGNOSIS — Z87891 Personal history of nicotine dependence: Secondary | ICD-10-CM | POA: Diagnosis not present

## 2015-04-01 DIAGNOSIS — F419 Anxiety disorder, unspecified: Secondary | ICD-10-CM | POA: Diagnosis not present

## 2015-04-01 DIAGNOSIS — S82101D Unspecified fracture of upper end of right tibia, subsequent encounter for closed fracture with routine healing: Secondary | ICD-10-CM | POA: Diagnosis not present

## 2015-04-01 DIAGNOSIS — I10 Essential (primary) hypertension: Secondary | ICD-10-CM | POA: Diagnosis not present

## 2015-04-06 DIAGNOSIS — S82101D Unspecified fracture of upper end of right tibia, subsequent encounter for closed fracture with routine healing: Secondary | ICD-10-CM | POA: Diagnosis not present

## 2015-04-06 DIAGNOSIS — I10 Essential (primary) hypertension: Secondary | ICD-10-CM | POA: Diagnosis not present

## 2015-04-06 DIAGNOSIS — Z87891 Personal history of nicotine dependence: Secondary | ICD-10-CM | POA: Diagnosis not present

## 2015-04-06 DIAGNOSIS — I82401 Acute embolism and thrombosis of unspecified deep veins of right lower extremity: Secondary | ICD-10-CM | POA: Diagnosis not present

## 2015-04-06 DIAGNOSIS — F419 Anxiety disorder, unspecified: Secondary | ICD-10-CM | POA: Diagnosis not present

## 2015-04-06 DIAGNOSIS — K219 Gastro-esophageal reflux disease without esophagitis: Secondary | ICD-10-CM | POA: Diagnosis not present

## 2015-04-08 DIAGNOSIS — Z87891 Personal history of nicotine dependence: Secondary | ICD-10-CM | POA: Diagnosis not present

## 2015-04-08 DIAGNOSIS — S82101D Unspecified fracture of upper end of right tibia, subsequent encounter for closed fracture with routine healing: Secondary | ICD-10-CM | POA: Diagnosis not present

## 2015-04-08 DIAGNOSIS — K219 Gastro-esophageal reflux disease without esophagitis: Secondary | ICD-10-CM | POA: Diagnosis not present

## 2015-04-08 DIAGNOSIS — I10 Essential (primary) hypertension: Secondary | ICD-10-CM | POA: Diagnosis not present

## 2015-04-08 DIAGNOSIS — I82401 Acute embolism and thrombosis of unspecified deep veins of right lower extremity: Secondary | ICD-10-CM | POA: Diagnosis not present

## 2015-04-08 DIAGNOSIS — F419 Anxiety disorder, unspecified: Secondary | ICD-10-CM | POA: Diagnosis not present

## 2015-04-13 DIAGNOSIS — F419 Anxiety disorder, unspecified: Secondary | ICD-10-CM | POA: Diagnosis not present

## 2015-04-13 DIAGNOSIS — K219 Gastro-esophageal reflux disease without esophagitis: Secondary | ICD-10-CM | POA: Diagnosis not present

## 2015-04-13 DIAGNOSIS — I82401 Acute embolism and thrombosis of unspecified deep veins of right lower extremity: Secondary | ICD-10-CM | POA: Diagnosis not present

## 2015-04-13 DIAGNOSIS — Z87891 Personal history of nicotine dependence: Secondary | ICD-10-CM | POA: Diagnosis not present

## 2015-04-13 DIAGNOSIS — I10 Essential (primary) hypertension: Secondary | ICD-10-CM | POA: Diagnosis not present

## 2015-04-13 DIAGNOSIS — S82101D Unspecified fracture of upper end of right tibia, subsequent encounter for closed fracture with routine healing: Secondary | ICD-10-CM | POA: Diagnosis not present

## 2015-04-14 DIAGNOSIS — M25561 Pain in right knee: Secondary | ICD-10-CM | POA: Diagnosis not present

## 2015-04-15 DIAGNOSIS — F419 Anxiety disorder, unspecified: Secondary | ICD-10-CM | POA: Diagnosis not present

## 2015-04-15 DIAGNOSIS — S82101D Unspecified fracture of upper end of right tibia, subsequent encounter for closed fracture with routine healing: Secondary | ICD-10-CM | POA: Diagnosis not present

## 2015-04-15 DIAGNOSIS — I82401 Acute embolism and thrombosis of unspecified deep veins of right lower extremity: Secondary | ICD-10-CM | POA: Diagnosis not present

## 2015-04-15 DIAGNOSIS — Z87891 Personal history of nicotine dependence: Secondary | ICD-10-CM | POA: Diagnosis not present

## 2015-04-15 DIAGNOSIS — I10 Essential (primary) hypertension: Secondary | ICD-10-CM | POA: Diagnosis not present

## 2015-04-15 DIAGNOSIS — K219 Gastro-esophageal reflux disease without esophagitis: Secondary | ICD-10-CM | POA: Diagnosis not present

## 2015-04-20 DIAGNOSIS — S82101D Unspecified fracture of upper end of right tibia, subsequent encounter for closed fracture with routine healing: Secondary | ICD-10-CM | POA: Diagnosis not present

## 2015-04-20 DIAGNOSIS — I82401 Acute embolism and thrombosis of unspecified deep veins of right lower extremity: Secondary | ICD-10-CM | POA: Diagnosis not present

## 2015-04-20 DIAGNOSIS — K219 Gastro-esophageal reflux disease without esophagitis: Secondary | ICD-10-CM | POA: Diagnosis not present

## 2015-04-20 DIAGNOSIS — I10 Essential (primary) hypertension: Secondary | ICD-10-CM | POA: Diagnosis not present

## 2015-04-20 DIAGNOSIS — F419 Anxiety disorder, unspecified: Secondary | ICD-10-CM | POA: Diagnosis not present

## 2015-04-20 DIAGNOSIS — Z87891 Personal history of nicotine dependence: Secondary | ICD-10-CM | POA: Diagnosis not present

## 2015-04-27 DIAGNOSIS — I82401 Acute embolism and thrombosis of unspecified deep veins of right lower extremity: Secondary | ICD-10-CM | POA: Diagnosis not present

## 2015-04-27 DIAGNOSIS — F419 Anxiety disorder, unspecified: Secondary | ICD-10-CM | POA: Diagnosis not present

## 2015-04-27 DIAGNOSIS — S82101D Unspecified fracture of upper end of right tibia, subsequent encounter for closed fracture with routine healing: Secondary | ICD-10-CM | POA: Diagnosis not present

## 2015-04-27 DIAGNOSIS — K219 Gastro-esophageal reflux disease without esophagitis: Secondary | ICD-10-CM | POA: Diagnosis not present

## 2015-04-27 DIAGNOSIS — Z87891 Personal history of nicotine dependence: Secondary | ICD-10-CM | POA: Diagnosis not present

## 2015-04-27 DIAGNOSIS — I10 Essential (primary) hypertension: Secondary | ICD-10-CM | POA: Diagnosis not present

## 2015-04-29 DIAGNOSIS — K219 Gastro-esophageal reflux disease without esophagitis: Secondary | ICD-10-CM | POA: Diagnosis not present

## 2015-04-29 DIAGNOSIS — I10 Essential (primary) hypertension: Secondary | ICD-10-CM | POA: Diagnosis not present

## 2015-04-29 DIAGNOSIS — F419 Anxiety disorder, unspecified: Secondary | ICD-10-CM | POA: Diagnosis not present

## 2015-04-29 DIAGNOSIS — I82401 Acute embolism and thrombosis of unspecified deep veins of right lower extremity: Secondary | ICD-10-CM | POA: Diagnosis not present

## 2015-04-29 DIAGNOSIS — Z87891 Personal history of nicotine dependence: Secondary | ICD-10-CM | POA: Diagnosis not present

## 2015-04-29 DIAGNOSIS — S82101D Unspecified fracture of upper end of right tibia, subsequent encounter for closed fracture with routine healing: Secondary | ICD-10-CM | POA: Diagnosis not present

## 2015-05-07 ENCOUNTER — Other Ambulatory Visit: Payer: Self-pay

## 2015-05-07 MED ORDER — RIVAROXABAN 20 MG PO TABS: ORAL_TABLET | ORAL | Status: DC

## 2015-05-10 DIAGNOSIS — M25561 Pain in right knee: Secondary | ICD-10-CM | POA: Diagnosis not present

## 2015-05-24 ENCOUNTER — Other Ambulatory Visit (INDEPENDENT_AMBULATORY_CARE_PROVIDER_SITE_OTHER): Payer: Medicare Other

## 2015-05-24 ENCOUNTER — Encounter: Payer: Self-pay | Admitting: Family

## 2015-05-24 ENCOUNTER — Ambulatory Visit (INDEPENDENT_AMBULATORY_CARE_PROVIDER_SITE_OTHER): Payer: Medicare Other | Admitting: Family

## 2015-05-24 VITALS — BP 162/110 | HR 65 | Temp 97.9°F | Resp 18 | Ht 70.0 in | Wt 172.0 lb

## 2015-05-24 DIAGNOSIS — C61 Malignant neoplasm of prostate: Secondary | ICD-10-CM | POA: Diagnosis not present

## 2015-05-24 DIAGNOSIS — I10 Essential (primary) hypertension: Secondary | ICD-10-CM | POA: Diagnosis not present

## 2015-05-24 DIAGNOSIS — Z0000 Encounter for general adult medical examination without abnormal findings: Secondary | ICD-10-CM | POA: Diagnosis not present

## 2015-05-24 DIAGNOSIS — I639 Cerebral infarction, unspecified: Secondary | ICD-10-CM | POA: Diagnosis not present

## 2015-05-24 DIAGNOSIS — I82441 Acute embolism and thrombosis of right tibial vein: Secondary | ICD-10-CM | POA: Diagnosis not present

## 2015-05-24 LAB — CBC
HCT: 36.7 % — ABNORMAL LOW (ref 39.0–52.0)
Hemoglobin: 12.1 g/dL — ABNORMAL LOW (ref 13.0–17.0)
MCHC: 33.1 g/dL (ref 30.0–36.0)
MCV: 91 fl (ref 78.0–100.0)
PLATELETS: 189 10*3/uL (ref 150.0–400.0)
RBC: 4.03 Mil/uL — AB (ref 4.22–5.81)
RDW: 14.4 % (ref 11.5–15.5)
WBC: 7.5 10*3/uL (ref 4.0–10.5)

## 2015-05-24 LAB — LIPID PANEL
CHOL/HDL RATIO: 5
CHOLESTEROL: 186 mg/dL (ref 0–200)
HDL: 37.7 mg/dL — AB (ref >39.00)
LDL CALC: 121 mg/dL — AB (ref 0–99)
NonHDL: 148.37
TRIGLYCERIDES: 139 mg/dL (ref 0.0–149.0)
VLDL: 27.8 mg/dL (ref 0.0–40.0)

## 2015-05-24 LAB — HEPATIC FUNCTION PANEL
ALBUMIN: 3.9 g/dL (ref 3.5–5.2)
ALK PHOS: 58 U/L (ref 39–117)
ALT: 8 U/L (ref 0–53)
AST: 14 U/L (ref 0–37)
Bilirubin, Direct: 0.1 mg/dL (ref 0.0–0.3)
TOTAL PROTEIN: 6.9 g/dL (ref 6.0–8.3)
Total Bilirubin: 0.3 mg/dL (ref 0.2–1.2)

## 2015-05-24 LAB — BASIC METABOLIC PANEL
BUN: 16 mg/dL (ref 6–23)
CALCIUM: 9.2 mg/dL (ref 8.4–10.5)
CO2: 24 mEq/L (ref 19–32)
CREATININE: 1.1 mg/dL (ref 0.40–1.50)
Chloride: 106 mEq/L (ref 96–112)
GFR: 73.35 mL/min (ref >60.00)
Glucose, Bld: 93 mg/dL (ref 70–99)
Potassium: 4.3 mEq/L (ref 3.5–5.1)
SODIUM: 142 meq/L (ref 135–145)

## 2015-05-24 LAB — TSH: TSH: 1.93 u[IU]/mL (ref 0.35–4.50)

## 2015-05-24 LAB — PSA: PSA: 0 ng/mL — AB (ref 0.10–4.00)

## 2015-05-24 LAB — HEPATITIS C ANTIBODY: HCV Ab: NEGATIVE

## 2015-05-24 NOTE — Assessment & Plan Note (Signed)
Stable and currently in remission and monitored by [name] of Urology. Obtain PSA today and will forward results to Dr. Cy Blamer office.

## 2015-05-24 NOTE — Assessment & Plan Note (Addendum)
Reviewed and updated patient's medical, surgical, family and social history. Medications and allergies were also reviewed. Basic screenings for depression, activities of daily living, hearing, cognition and safety were performed. Provider list was updated and health plan was provided to the patient.   1) Anticipatory Guidance: Discussed importance of wearing a seatbelt while driving and not texting while driving; changing batteries in smoke detector at least once annually; wearing suntan lotion when outside; eating a balanced and moderate diet; getting physical activity at least 30 minutes per day.  2) Immunizations / Screenings / Labs:  Declines tetanus and will check on last date received. All other immunizations are up to date per recommendations. Due for dental and vision screen which he is encouraged to complete independently. Obtain Hepatis C for screen. Obtain PSA for prostate cancer screen.  Declines HIV at present. All other screenings are up to date per recommendations. Obtain CBC, BMET, Lipid profile and TSH.   Establish goal of improving dietary intake through moderate and varied diet to increase nutrient dense foods. Encouraged physical activity and decreased alcohol consumption. Other risk factors managed through medication.

## 2015-05-24 NOTE — Patient Instructions (Addendum)
Thank you for choosing [facility].  Summary/Instructions:  Please restart your lisinopril - start at 20 mg (1/2 tablet of what you have).  Please stop by the lab on the basement level of the building for your blood work. Your results will be released to [place] (or called to you) after review, usually within 72 hours after test completion. If any changes need to be made, you will be notified at that same time.  If your symptoms worsen or fail to improve, please contact our office for further instruction, or in case of emergency go directly to the emergency room at the closest medical facility.   Health Maintenance  Topic Date Due  . Hepatitis C Screening  [birth year]  . HIV Screening  06/16/1973  . TETANUS/TDAP  06/16/1977  . INFLUENZA VACCINE  02/08/2016  . COLONOSCOPY  03/30/2019     Health Maintenance, Male A healthy lifestyle and preventative care can promote health and wellness.  Maintain regular health, dental, and eye exams.  Eat a healthy diet. Foods like vegetables, fruits, whole grains, low-fat dairy products, and lean protein foods contain the nutrients you need and are low in calories. Decrease your intake of foods high in solid fats, added sugars, and salt. Get information about a proper diet from your health care provider, if necessary.  Regular physical exercise is one of the most important things you can do for your health. Most adults should get at least 150 minutes of moderate-intensity exercise (any activity that increases your heart rate and causes you to sweat) each week. In addition, most adults need muscle-strengthening exercises on 2 or more days a week.   Maintain a healthy weight. The body mass index (BMI) is a screening tool to identify possible weight problems. It provides an estimate of body fat based on height and weight. Your health care provider can find your BMI and can help you achieve or maintain a healthy weight. For males 20 years and  older:  A BMI below 18.5 is considered underweight.  A BMI of 18.5 to 24.9 is normal.  A BMI of 25 to 29.9 is considered overweight.  A BMI of 30 and above is considered obese.  Maintain normal blood lipids and cholesterol by exercising and minimizing your intake of saturated fat. Eat a balanced diet with plenty of fruits and vegetables. Blood tests for lipids and cholesterol should begin at age 59 and be repeated every 5 years. If your lipid or cholesterol levels are high, you are over age 74, or you are at high risk for heart disease, you may need your cholesterol levels checked more frequently.Ongoing high lipid and cholesterol levels should be treated with medicines if diet and exercise are not working.  If you smoke, find out from your health care provider how to quit. If you do not use tobacco, do not start.  Lung cancer screening is recommended for adults aged 32-80 years who are at high risk for developing lung cancer because of a history of smoking. A yearly low-dose CT scan of the lungs is recommended for people who have at least a 30-pack-year history of smoking and are current smokers or have quit within the past 15 years. A pack year of smoking is smoking an average of 1 pack of cigarettes a day for 1 year (for example, a 30-pack-year history of smoking could mean smoking 1 pack a day for 30 years or 2 packs a day for 15 years). Yearly screening should continue until the smoker has  stopped smoking for at least 15 years. Yearly screening should be stopped for people who develop a health problem that would prevent them from having lung cancer treatment.  If you choose to drink alcohol, do not have more than 2 drinks per day. One drink is considered to be 12 oz (360 mL) of beer, 5 oz (150 mL) of wine, or 1.5 oz (45 mL) of liquor.  Avoid the use of street drugs. Do not share needles with anyone. Ask for help if you need support or instructions about stopping the use of drugs.  High  blood pressure causes heart disease and increases the risk of stroke. High blood pressure is more likely to develop in:  People who have blood pressure in the end of the normal range (100-139/85-89 mm Hg).  People who are overweight or obese.  People who are African American.  If you are 72-11 years of age, have your blood pressure checked every 3-5 years. If you are 25 years of age or older, have your blood pressure checked every year. You should have your blood pressure measured twice--once when you are at a hospital or clinic, and once when you are not at a hospital or clinic. Record the average of the two measurements. To check your blood pressure when you are not at a hospital or clinic, you can use:  An automated blood pressure machine at a pharmacy.  A home blood pressure monitor.  If you are 60-57 years old, ask your health care provider if you should take aspirin to prevent heart disease.  Diabetes screening involves taking a blood sample to check your fasting blood sugar level. This should be done once every 3 years after age 53 if you are at a normal weight and without risk factors for diabetes. Testing should be considered at a younger age or be carried out more frequently if you are overweight and have at least 1 risk factor for diabetes.  Colorectal cancer can be detected and often prevented. Most routine colorectal cancer screening begins at the age of 54 and continues through age 50. However, your health care provider may recommend screening at an earlier age if you have risk factors for colon cancer. On a yearly basis, your health care provider may provide home test kits to check for hidden blood in the stool. A small camera at the end of a tube may be used to directly examine the colon (sigmoidoscopy or colonoscopy) to detect the earliest forms of colorectal cancer. Talk to your health care provider about this at age 70 when routine screening begins. A direct exam of the colon  should be repeated every 5-10 years through age 43, unless early forms of precancerous polyps or small growths are found.  People who are at an increased risk for hepatitis B should be screened for this virus. You are considered at high risk for hepatitis B if:  You were born in a country where hepatitis B occurs often. Talk with your health care provider about which countries are considered high risk.  Your parents were born in a high-risk country and you have not received a shot to protect against hepatitis B (hepatitis B vaccine).  You have HIV or AIDS.  You use needles to inject street drugs.  You live with, or have sex with, someone who has hepatitis B.  You are a man who has sex with other men (MSM).  You get hemodialysis treatment.  You take certain medicines for conditions like cancer, organ  transplantation, and autoimmune conditions.  Hepatitis C blood testing is recommended for all people born from [birth year] through 1963 and any individual with known risk factors for hepatitis C.  Healthy men should no longer receive prostate-specific antigen (PSA) blood tests as part of routine cancer screening. Talk to your health care provider about prostate cancer screening.  Testicular cancer screening is not recommended for adolescents or adult males who have no symptoms. Screening includes self-exam, a health care provider exam, and other screening tests. Consult with your health care provider about any symptoms you have or any concerns you have about testicular cancer.  Practice safe sex. Use condoms and avoid high-risk sexual practices to reduce the spread of sexually transmitted infections (STIs).  You should be screened for STIs, including gonorrhea and chlamydia if:  You are sexually active and are younger than 24 years.  You are older than 24 years, and your health care provider tells you that you are at risk for this type of infection.  Your sexual activity has changed since you  were last screened, and you are at an increased risk for chlamydia or gonorrhea. Ask your health care provider if you are at risk.  If you are at risk of being infected with HIV, it is recommended that you take a prescription medicine daily to prevent HIV infection. This is called pre-exposure prophylaxis (PrEP). You are considered at risk if:  You are a man who has sex with other men (MSM).  You are a heterosexual man who is sexually active with multiple partners.  You take drugs by injection.  You are sexually active with a partner who has HIV.  Talk with your health care provider about whether you are at high risk of being infected with HIV. If you choose to begin PrEP, you should first be tested for HIV. You should then be tested every 3 months for as long as you are taking PrEP.  Use sunscreen. Apply sunscreen liberally and repeatedly throughout the day. You should seek shade when your shadow is shorter than you. Protect yourself by wearing long sleeves, pants, a wide-brimmed hat, and sunglasses year round whenever you are outdoors.  Tell your health care provider of new moles or changes in moles, especially if there is a change in shape or color. Also, tell your health care provider if a mole is larger than the size of a pencil eraser.  A one-time screening for abdominal aortic aneurysm (AAA) and surgical repair of large AAAs by ultrasound is recommended for men aged 46-75 years who are current or former smokers.  Stay current with your vaccines (immunizations).   This information is not intended to replace advice given to you by your health care provider. Make sure you discuss any questions you have with your health care provider.   Document Released: 12/23/2007 Document Revised: 07/17/2014 Document Reviewed: 11/21/2010 Elsevier Interactive Patient Education [employer].

## 2015-05-24 NOTE — Progress Notes (Signed)
Pre visit review using our clinic review tool, if applicable. No additional management support is needed unless otherwise documented below in the visit note. 

## 2015-05-24 NOTE — Progress Notes (Signed)
Subjective:    Patient ID: Tanner [name], male    DOB: [birth year], 57 y.o.   MRN: [id number]  Chief Complaint  Patient presents with  . Establish Care    CPE, Fasting, Hx of prostate cancer, testing for hepatitis    HPI:  Tanner [name] is a 59 y.o. male who presents today for an annual wellness visit.   1) Health Maintenance -   Diet - Averages about 2 meals per day consisting of eggs, pork, TV or processed dinners; 1-2 cups of caffeine per day  Exercise - No structured exercise  2) Preventative Exams / Immunizations:  Dental -- Due for exam  Vision -- Due for exam   Health Maintenance  Topic Date Due  . Hepatitis C Screening  12-27-1955  . HIV Screening  06/16/1973  . TETANUS/TDAP  06/16/1977  . INFLUENZA VACCINE  02/08/2016  . COLONOSCOPY  03/30/2019     There is no immunization history on file for this patient.  Allergies  Allergen Reactions  . Dilaudid [Hydromorphone Hcl] Nausea And Vomiting    Pre pt history  . Oxycodone Nausea And Vomiting  . Procaine Hcl Other (See Comments)    Patient states he is allergic to novicaine  . Zofran [Ondansetron] Nausea And Vomiting  . Sulfa Antibiotics Rash     Outpatient Prescriptions Prior to Visit  Medication Sig Dispense Refill  . acetaminophen-codeine (TYLENOL #3) 300-30 MG per tablet Take 1 tablet by mouth every 4 (four) hours as needed for moderate pain. 60 tablet 0  . clonazePAM (KLONOPIN) 0.5 MG tablet Take 1 tablet (0.5 mg total) by mouth 3 (three) times daily as needed for anxiety. 30 tablet 0  . FLUoxetine (PROZAC) 20 MG capsule Take 1 capsule (20 mg total) by mouth daily. 30 capsule 3  . metoprolol (LOPRESSOR) 50 MG tablet Take 1 tablet (50 mg total) by mouth 2 (two) times daily. 60 tablet 1  . omeprazole (PRILOSEC) 40 MG capsule Take 1 capsule (40 mg total) by mouth 2 (two) times daily. 60 capsule 1  . rivaroxaban (XARELTO) 20 MG TABS tablet 1 Tablet by mouth daily 30 tablet 1  . simvastatin (ZOCOR) 20 MG  tablet Take 1 tablet (20 mg total) by mouth daily at 6 PM. 30 tablet 1  . traMADol (ULTRAM) 50 MG tablet Take 1 tablet (50 mg total) by mouth every 6 (six) hours as needed for moderate pain. 90 tablet 0  . traZODone (DESYREL) 50 MG tablet Take 1 tablet (50 mg total) by mouth at bedtime as needed for sleep. 30 tablet 1  . folic acid (FOLVITE) 1 MG tablet Take 1 tablet (1 mg total) by mouth daily. 30 tablet 1  . hyoscyamine (LEVSIN, ANASPAZ) 0.125 MG tablet Take 1 tablet (0.125 mg total) by mouth every 4 (four) hours as needed (bladder spasms). 40 tablet 4  . lisinopril (PRINIVIL,ZESTRIL) 40 MG tablet Take 1 tablet (40 mg total) by mouth daily. 30 tablet 1  . Multiple Vitamin (MULTIVITAMIN WITH MINERALS) TABS tablet Take 1 tablet by mouth daily.    . Rivaroxaban (XARELTO) 15 MG TABS tablet Continue until 03/16/2015 and stop 30 tablet 0   No facility-administered medications prior to visit.     Past Medical History  Diagnosis Date  . Alcoholic hepatitis   . Hypertension   . Shortness of breath     new onset- occasionally-at rest and exertion  . GERD (gastroesophageal reflux disease)   . Anxiety   . TIA (transient ischemic attack)  08/2013  . Arthritis     " in my spine "  . Ileus ([place]) 09/2013  . Substance abuse   . Alcohol abuse   . Prostate cancer ([place]) 05/22/13    Gleason 4+3=7  . Depression   . Allergy   . Stroke ([place])   . DVT (deep venous thrombosis) [facility])      Past Surgical History  Procedure Laterality Date  . Hand surgery Right   . Hernia repair    . Orif ankle fracture Right 12/10/2012    Procedure: OPEN REDUCTION INTERNAL FIXATION (ORIF) ANKLE FRACTURE;  Surgeon: Hessie Dibble, MD;  Location: [facility]S;  Service: Orthopedics;  Laterality: Right;  . Robot assisted laparoscopic radical prostatectomy N/A 08/29/2013    Procedure: ROBOTIC ASSISTED LAPAROSCOPIC RADICAL PROSTATECTOMY LEVEL 2, Lysis of adhesions;  Surgeon: Molli Hazard, MD;  Location: [facility];  Service:  Urology;  Laterality: N/A;  . Lymphadenectomy Bilateral 08/29/2013    Procedure: LYMPHADENECTOMY "BILATERAL PELVIC LYMPH NODE DISSECTION";  Surgeon: Molli Hazard, MD;  Location: [facility];  Service: Urology;  Laterality: Bilateral;     Family History  Problem Relation Age of Onset  . Pancreatic cancer Sister   . Lung cancer Father   . Arthritis Mother      Social History   Social History  . Marital Status: Divorced    Spouse Name: N/A  . Number of Children: 1  . Years of Education: 12   Occupational History  . disabled     Fingers, scolisosis,    Social History Main Topics  . Smoking status: Former Smoker -- 1.50 packs/day for 20 years    Quit date: 12/21/2012  . Smokeless tobacco: Never Used  . Alcohol Use: 1.8 - 3.6 oz/week    3-6 Cans of beer per week  . Drug Use: 7.00 per week    Special: Marijuana     Comment: marijuana-  last joint 08/24/13   cocaine 15 yrs ago  . Sexual Activity: Not on file   Other Topics Concern  . Not on file   Social History Narrative   Denies      RISK FACTORS  Tobacco History  Smoking status  . Former Smoker -- 1.50 packs/day for 20 years  . Quit date: 12/21/2012  Smokeless tobacco  . Never Used     Cardiac risk factors: advanced age (older than 65 for men, 72 for women), dyslipidemia, hypertension and sedentary lifestyle.  Depression Screen  Q1: Over the past two weeks, have you felt down, depressed or hopeless? No  Q2: Over the past two weeks, have you felt little interest or pleasure in doing things? No  Have you lost interest or pleasure in daily life? No  Do you often feel hopeless? No  Do you cry easily over simple problems? No   Depression screen PHQ 2/9 05/24/2015  Decreased Interest 0  Down, Depressed, Hopeless 0  PHQ - 2 Score 0     Activities of Daily Living In your present state of health, do you have any difficulty performing the following activities?:  Driving? Not cleared to drive Managing  money?  No Feeding yourself? No Getting from bed to chair? No Climbing a flight of stairs? No Preparing food and eating?: No Bathing or showering? No Getting dressed: No Getting to the toilet? No Using the toilet: No Moving around from place to place: No In the past year have you fallen or had a near fall?:Yes - stepped off a step  wrong last week.    Home Safety Has smoke detector and wears seat belts. No firearms. No excess sun exposure. Are there smokers in your home (other than you)?  No Do you feel safe at home?  Yes  Hearing Difficulties: No Do you often ask people to speak up or repeat themselves? No Do you experience ringing or noises in your ears? No  Do you have difficulty understanding soft or whispered voices? No    Cognitive Testing  Alert? Yes   Normal Appearance? Yes  Oriented to person? Yes  Place? Yes   Time? Yes  Recall of three objects?  Yes  Can perform simple calculations? Yes  Displays appropriate judgment? Yes  Can read the correct time from a watch face? Yes  Do you feel that you have a problem with memory? No  Do you often misplace items? No   Advanced Directives have been discussed with the patient? Yes  Current Physicians/Providers and Suppliers  1. Terri Piedra, [name] - Internal Medicine 2. Rana Snare, MD - Urology 3. Christena Flake, MD - Orthopedics  Indicate any recent Medical Services you may have received from other than [facility] providers in the past year (date may be approximate).  All answers were reviewed with the patient and necessary referrals were made:  Tanner [name], [place]   05/24/2015    Review of Systems  Constitutional: Denies fever, chills, fatigue, or significant weight gain/loss. HENT: Head: Denies headache or neck pain Ears: Denies changes in hearing, ringing in ears, earache, drainage Nose: Denies discharge, stuffiness, itching, nosebleed, sinus pain Throat: Denies sore throat, hoarseness, dry mouth, sores, thrush Eyes:  Denies loss/changes in vision, pain, redness, blurry/double vision, flashing lights Cardiovascular: Denies chest pain/discomfort, tightness, palpitations, shortness of breath with activity, difficulty lying down, swelling, sudden awakening with shortness of breath Respiratory: Denies shortness of breath, cough, sputum production, wheezing Gastrointestinal: Denies dysphasia, heartburn, change in appetite, nausea, change in bowel habits, rectal bleeding, constipation, diarrhea, yellow skin or eyes Genitourinary: Denies frequency, urgency, burning/pain, blood in urine, incontinence, change in urinary strength. Musculoskeletal: Denies muscle/joint pain, stiffness, back pain, redness or swelling of joints, trauma Skin: Denies rashes, lumps, itching, dryness, color changes, or hair/nail changes Neurological: Denies dizziness, fainting, seizures, weakness, numbness, tingling, tremor Psychiatric - Denies nervousness, stress, depression or memory loss Endocrine: Denies heat or cold intolerance, sweating, frequent urination, excessive thirst, changes in appetite Hematologic: Denies ease of bruising or bleeding    Objective:     BP 162/110 mmHg  Pulse 65  Temp(Src) 97.9 F (36.6 C) (Oral)  Resp 18  Ht '5\' 10"'$  (1.778 m)  Wt 172 lb (78.019 kg)  BMI 24.68 kg/m2  SpO2 99% Nursing note and vital signs reviewed.  Physical Exam  Constitutional: He is oriented to person, place, and time. He appears well-developed and well-nourished. No distress.  Cardiovascular: Normal rate, regular rhythm, normal heart sounds and intact distal pulses.   Pulmonary/Chest: Effort normal and breath sounds normal.  Neurological: He is alert and oriented to person, place, and time.  Skin: Skin is warm and dry.  Psychiatric: He has a normal mood and affect. His behavior is normal. Judgment and thought content normal.       Assessment & Plan:   During the course of the visit the patient was educated and counseled about  appropriate screening and preventive services including:    Pneumococcal vaccine   Hepatitis B vaccine  Prostate cancer screening  Colorectal cancer screening  Diabetes screening  Glaucoma screening  Nutrition counseling   Diet review for nutrition referral? Yes ____  Not Indicated _X___   Patient Instructions (the written plan) was given to the patient.  Medicare Attestation I have personally reviewed:  The patient's medical and social history Their use of alcohol, tobacco or illicit drugs Their current medications and supplements The patient's functional ability including ADLs,fall risks, home safety risks, cognitive, and hearing and visual impairment Diet and physical activities Evidence for depression or mood disorders  The patient's weight, height, BMI,  have been recorded in the chart.  I have made referrals, counseling, and provided education to the patient based on review of the above and I have provided the patient with a written personalized care plan for preventive services.     Tanner [name], [place]   05/24/2015    Problem List Items Addressed This Visit      Cardiovascular and Mediastinum   Essential hypertension, benign    Blood pressure is above goal of 140/90 today. Recently stopped lisinopril secondary to hypotension per patient. Currently on metoprolol with no adverse side effects. Restart lisinopril at 20 mg daily. Monitor blood pressure at home. Follow up pending lab work.       Relevant Orders   Basic metabolic panel   CBC   Lipid panel   TSH   Hepatic function panel   Hepatitis C antibody   Acute DVT of right tibial vein (HCC)    Anticoagulated on Xarelto. Does take additional Goody's powder and consumes a significant amount of alcohol. Encouraged to discontinue use of Goody's powder and limit alcohol consumption. Continue current dosage of Xarelto and follow up pending blood work.         Nervous and Auditory   CVA (cerebral infarction)      Currently stable and managing risk factors of hypertension and hyperlipidemia.         Genitourinary   Malignant neoplasm of prostate ([place])    Stable and currently in remission and monitored by [name] of Urology. Obtain PSA today and will forward results to Dr. Cy Blamer office.       Relevant Orders   PSA     Other   Medicare annual wellness visit, subsequent - Primary    Reviewed and updated patient's medical, surgical, family and social history. Medications and allergies were also reviewed. Basic screenings for depression, activities of daily living, hearing, cognition and safety were performed. Provider list was updated and health plan was provided to the patient.   1) Anticipatory Guidance: Discussed importance of wearing a seatbelt while driving and not texting while driving; changing batteries in smoke detector at least once annually; wearing suntan lotion when outside; eating a balanced and moderate diet; getting physical activity at least 30 minutes per day.  2) Immunizations / Screenings / Labs:  Declines tetanus and will check on last date received. All other immunizations are up to date per recommendations. Due for dental and vision screen which he is encouraged to complete independently. Obtain Hepatis C for screen. Obtain PSA for prostate cancer screen.  Declines HIV at present. All other screenings are up to date per recommendations. Obtain CBC, BMET, Lipid profile and TSH.   Establish goal of improving dietary intake through moderate and varied diet to increase nutrient dense foods. Encouraged physical activity and decreased alcohol consumption. Other risk factors managed through medication.

## 2015-05-24 NOTE — Assessment & Plan Note (Addendum)
Currently stable and managing risk factors of hypertension and hyperlipidemia.

## 2015-05-24 NOTE — Assessment & Plan Note (Signed)
Blood pressure is above goal of 140/90 today. Recently stopped lisinopril secondary to hypotension per patient. Currently on metoprolol with no adverse side effects. Restart lisinopril at 20 mg daily. Monitor blood pressure at home. Follow up pending lab work.

## 2015-05-24 NOTE — Assessment & Plan Note (Signed)
Anticoagulated on Xarelto. Does take additional Goody's powder and consumes a significant amount of alcohol. Encouraged to discontinue use of Goody's powder and limit alcohol consumption. Continue current dosage of Xarelto and follow up pending blood work.

## 2015-05-25 ENCOUNTER — Encounter: Payer: Self-pay | Admitting: Family

## 2015-05-26 ENCOUNTER — Telehealth: Payer: Self-pay | Admitting: Family

## 2015-05-26 NOTE — Telephone Encounter (Signed)
Patient has questions on current medications. Please give him a call. He had several questions and i was unable to follow the meds he was mentioning in his chart.

## 2015-05-27 NOTE — Telephone Encounter (Signed)
Called pt back to confirm questions on medications.

## 2015-06-02 DIAGNOSIS — M25561 Pain in right knee: Secondary | ICD-10-CM | POA: Diagnosis not present

## 2015-06-11 ENCOUNTER — Encounter: Payer: Self-pay | Admitting: Physical Therapy

## 2015-06-11 ENCOUNTER — Ambulatory Visit: Payer: Medicare Other | Attending: Orthopaedic Surgery | Admitting: Physical Therapy

## 2015-06-11 DIAGNOSIS — R262 Difficulty in walking, not elsewhere classified: Secondary | ICD-10-CM

## 2015-06-11 DIAGNOSIS — M7989 Other specified soft tissue disorders: Secondary | ICD-10-CM | POA: Diagnosis not present

## 2015-06-11 DIAGNOSIS — M25561 Pain in right knee: Secondary | ICD-10-CM | POA: Diagnosis not present

## 2015-06-11 DIAGNOSIS — M25661 Stiffness of right knee, not elsewhere classified: Secondary | ICD-10-CM

## 2015-06-11 NOTE — Therapy (Signed)
[address] [place], [place], [address] Phone: [phone]   Fax:  [phone]  Physical Therapy Evaluation  Patient Details  Name: Tanner [name] MRN: [id number] Date of Birth: 1955-09-24 Referring Provider: Rhona Raider  Encounter Date: 06/11/2015      PT End of Session - 06/11/15 1109    Visit Number 1   Date for PT Re-Evaluation 08/12/15   PT Start Time 1010   PT Stop Time 1116   PT Time Calculation (min) 66 min   Activity Tolerance Patient limited by pain   Behavior During Therapy [facility] for tasks assessed/performed      Past Medical History  Diagnosis Date  . Alcoholic hepatitis   . Hypertension   . Shortness of breath     new onset- occasionally-at rest and exertion  . GERD (gastroesophageal reflux disease)   . Anxiety   . TIA (transient ischemic attack) 08/2013  . Arthritis     " in my spine "  . Ileus ([place]) 09/2013  . Substance abuse   . Alcohol abuse   . 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 Depression   . Allergy   . Stroke ([place])   . DVT (deep venous thrombosis) [facility])     Past Surgical History  Procedure Laterality Date  . Hand surgery Right   . Hernia repair    . Orif ankle fracture Right 12/10/2012    Procedure: OPEN REDUCTION INTERNAL FIXATION (ORIF) ANKLE FRACTURE;  Surgeon: Hessie Dibble, MD;  [facility]: [facility];  Service: Orthopedics;  Laterality: Right;  . Robot assisted laparoscopic radical prostatectomy N/A 08/29/2013    Procedure: ROBOTIC ASSISTED LAPAROSCOPIC RADICAL PROSTATECTOMY LEVEL 2, Lysis of adhesions;  Surgeon: Molli Hazard, MD;  [facility];  Service: Urology;  Laterality: N/A;  . Lymphadenectomy Bilateral 08/29/2013    Procedure: LYMPHADENECTOMY "BILATERAL PELVIC LYMPH NODE DISSECTION";  Surgeon: Molli Hazard, MD;  [facility];  Service: Urology;  Laterality: Bilateral;    There were no vitals filed for this visit.  Visit  Diagnosis:  Right knee pain - Plan: PT plan of care cert/re-cert  Difficulty walking - Plan: PT plan of care cert/re-cert  Stiffness of right knee - Plan: PT plan of care cert/re-cert      Subjective Assessment - 06/11/15 1015    Subjective Patient reports he tripped over his threshold at his door in August.  Sustained a right tibial plateua fracture.  He reports that he had a CVA in February 2015.  Also with a history of right ankle and tibia fracture.  He reports that since August he was NWB for 2 months.  He has been in a knee immobilizer until last week when he was given a small hinged brace.    Limitations Sitting;Lifting;Walking;House hold activities   Patient Stated Goals have less pain and better gait and ability to walk   Currently in Pain? Yes   Pain Score 3    Pain Location Knee  ankle   Pain Orientation Right   Pain Descriptors / Indicators Aching;Sore;Tightness   Pain Type Chronic pain   Pain Onset More than a month ago   Pain Frequency Constant   Aggravating Factors  bending the knee, walking and at night the pain is up to 9/10   Pain Relieving Factors pain meds and rest 2/10   Effect of Pain on Daily Activities limits All ADL's  [facility] PT Assessment - 06/11/15 0001    Assessment   Medical Diagnosis right tibial plateau fracture   Referring Provider [place]   Onset Date/Surgical Date 02/17/15   Prior Therapy no   Precautions   Precautions None   Balance Screen   Has the patient fallen in the past 6 months Yes   How many times? 2   Has the patient had a decrease in activity level because of a fear of falling?  No   Is the patient reluctant to leave their home because of a fear of falling?  No   Home Environment   Additional Comments does housework and yardwork, steps into the home   Prior Function   Level of Independence Independent   Vocation On disability   Leisure no exercise   AROM   Overall AROM Comments AROM 10-80 degrees flexion, PROM  5-82 degrees flexion, right ankle AROM DF 0, PF 35, Inversion 15, eversion 5 degress with pain    Strength   Overall Strength Comments 4-/5 with pain in the patellar area   Flexibility   Soft Tissue Assessment /Muscle Length --  very tight quads, calf and mild HS tightness   Palpation   Palpation comment patella is mobile, very tight quad with knee flexion, non tender to palpation   Ambulation/Gait   Gait Comments uses 2 crutches, slow gait, seems to hild right leg stiff, limited toe off, does not put full weight on the right leg.                     [facility] Adult PT Treatment/Exercise - 06/11/15 0001    Exercises   Exercises Knee/Hip   Knee/Hip Exercises: Aerobic   Nustep Level 3 x 5 minutes   Modalities   Modalities Moist Heat;Electrical Stimulation   Moist Heat Therapy   Number Minutes Moist Heat 15 Minutes   Moist Heat Location Knee   Electrical Stimulation   Electrical Stimulation Location right knee   Electrical Stimulation Action IFC   Electrical Stimulation Parameters tolerance   Electrical Stimulation Goals Pain                PT Education - 06/11/15 1107    Education provided Yes   Education Details low load long duration stretches for flexion of the knee   Person(s) Educated Patient   Methods Explanation;Handout   Comprehension Verbalized understanding          PT Short Term Goals - 06/11/15 1136    PT SHORT TERM GOAL #1   Title indepednent with initial HEP   Time 1   Period Weeks   Status New           PT Long Term Goals - 06/11/15 1136    PT LONG TERM GOAL #1   Title walk without assistive device 500 feet   Time 8   Period Weeks   Status New   PT LONG TERM GOAL #2   Title decrease pain 50%   Time 8   Period Weeks   Status New   PT LONG TERM GOAL #3   Title increase AROM of the right knee to 0-110 degrees flexion   Time 8   Period Weeks   Status New   PT LONG TERM GOAL #4   Title ascend/descend stairs reciprocally    Time 8   Period Weeks   Status New               Plan -  06/22/15 1109    Clinical Impression Statement Patient has a history of CVA and right ankle and tibia fracture in the past 3 years.  He reports a fall in August and sustained a right tibial plateau fracture.  He was immobilized and reports he was NWBing x 2 months.  He presents with ROM very limited in flexion to 80 degrees.  Ambulates with 2 crutches.   Pt will benefit from skilled therapeutic intervention in order to improve on the following deficits Abnormal gait;Decreased activity tolerance;Decreased mobility;Decreased range of motion;Decreased strength;Difficulty walking;Increased muscle spasms;Impaired flexibility;Pain   Rehab Potential Good   PT Frequency 2x / week   PT Duration 8 weeks   PT Treatment/Interventions Electrical Stimulation;Cryotherapy;Moist Heat;Iontophoresis '4mg'$ /ml Dexamethasone;Therapeutic exercise;Therapeutic activities;Vasopneumatic Device;Manual techniques;Functional mobility training;Stair training;Gait training;ADLs/Self Care Home Management;Ultrasound;Balance training;Passive range of motion;Scar mobilization;Patient/family education   PT Next Visit Plan gently add exercises and work on a more independent gait   Consulted and Agree with Plan of Care Patient          G-Codes - 06-22-2015 1139    Functional Assessment Tool Used foto   Functional Limitation Mobility: Walking and moving around   Mobility: Walking and Moving Around Current Status [phone]) At least 60 percent but less than 80 percent impaired, limited or restricted   Mobility: Walking and Moving Around Goal Status [phone]) At least 40 percent but less than 60 percent impaired, limited or restricted       Problem List Patient Active Problem List   Diagnosis Date Noted  . Medicare annual wellness visit, subsequent 05/24/2015  . Acute DVT of right tibial vein ([place]) 02/27/2015  . Tibia fracture 02/20/2015  . Tibial fracture 02/18/2015   . Syncope 02/18/2015  . Essential hypertension 02/18/2015  . History of stroke 02/18/2015  . Fracture of tibia, right, closed   . C. difficile colitis 09/11/2013  . UTI (urinary tract infection) 09/11/2013  . Essential hypertension, benign 09/11/2013  . Ileus ([place]) 09/10/2013  . Spastic hemiplegia affecting dominant side ([place]) 09/09/2013  . Ileus, postoperative 09/09/2013  . Hypokalemia 09/09/2013  . Nonspecific (abnormal) findings on radiological and other examination of gastrointestinal tract 09/09/2013  . CVA (cerebral infarction) 09/05/2013  . 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 Malignant neoplasm of prostate ([place]) 08/29/2013  . Prostate cancer ([place]) 05/22/2013  . Hyponatremia 12/11/2012    Class: Chronic  . Ankle fracture 12/10/2012    Sumner Boast., PT June 22, 2015, 11:41 AM  [address] [place], [place], [address] Phone: [phone]   Fax:  [phone]  Name: Tanner [name] MRN: [id number] Date of Birth: Nov 28, 1955

## 2015-06-14 ENCOUNTER — Telehealth: Payer: Self-pay | Admitting: Family

## 2015-06-14 ENCOUNTER — Ambulatory Visit: Payer: Medicare Other | Admitting: Physical Therapy

## 2015-06-15 NOTE — Telephone Encounter (Signed)
Pt called in and is looking for a refill on his xarelto

## 2015-06-15 NOTE — Telephone Encounter (Signed)
Refill has been sent. Pt aware.

## 2015-06-16 ENCOUNTER — Encounter: Payer: Self-pay | Admitting: Physical Therapy

## 2015-06-16 ENCOUNTER — Ambulatory Visit: Payer: Medicare Other | Admitting: Physical Therapy

## 2015-06-16 DIAGNOSIS — M25561 Pain in right knee: Secondary | ICD-10-CM

## 2015-06-16 DIAGNOSIS — M25661 Stiffness of right knee, not elsewhere classified: Secondary | ICD-10-CM

## 2015-06-16 DIAGNOSIS — M7989 Other specified soft tissue disorders: Secondary | ICD-10-CM | POA: Diagnosis not present

## 2015-06-16 DIAGNOSIS — R262 Difficulty in walking, not elsewhere classified: Secondary | ICD-10-CM

## 2015-06-16 NOTE — Therapy (Signed)
[address] [place], [place], [address] Phone: [phone]   Fax:  [phone]  Physical Therapy Treatment  Patient Details  Name: Tanner [name] MRN: [id number] Date of Birth: 29-Jun-1956 Referring Provider: Rhona Raider  Encounter Date: 06/16/2015      PT End of Session - 06/16/15 1520    Visit Number 2   Date for PT Re-Evaluation 08/12/15   PT Start Time 1430   PT Stop Time 1530   PT Time Calculation (min) 60 min   Activity Tolerance Patient limited by pain   Behavior During Therapy [facility] for tasks assessed/performed      Past Medical History  Diagnosis Date  . Alcoholic hepatitis   . Hypertension   . Shortness of breath     new onset- occasionally-at rest and exertion  . GERD (gastroesophageal reflux disease)   . Anxiety   . TIA (transient ischemic attack) 08/2013  . Arthritis     " in my spine "  . Ileus ([place]) 09/2013  . Substance abuse   . Alcohol abuse   . Prostate cancer ([place]) 05/22/13    Gleason 4+3=7  . Depression   . Allergy   . Stroke ([place])   . DVT (deep venous thrombosis) [facility])     Past Surgical History  Procedure Laterality Date  . Hand surgery Right   . Hernia repair    . Orif ankle fracture Right 12/10/2012    Procedure: OPEN REDUCTION INTERNAL FIXATION (ORIF) ANKLE FRACTURE;  Surgeon: Hessie Dibble, MD;  [facility];  Service: Orthopedics;  Laterality: Right;  . Robot assisted laparoscopic radical prostatectomy N/A 08/29/2013    Procedure: ROBOTIC ASSISTED LAPAROSCOPIC RADICAL PROSTATECTOMY LEVEL 2, Lysis of adhesions;  Surgeon: Molli Hazard, MD;  [facility];  Service: Urology;  Laterality: N/A;  . Lymphadenectomy Bilateral 08/29/2013    Procedure: LYMPHADENECTOMY "BILATERAL PELVIC LYMPH NODE DISSECTION";  Surgeon: Molli Hazard, MD;  [facility];  Service: Urology;  Laterality: Bilateral;    There were no vitals filed for this visit.  Visit  Diagnosis:  Right knee pain  Difficulty walking  Stiffness of right knee  Leg swelling      Subjective Assessment - 06/16/15 1436    Subjective Pt reports increased pain a few days ago, Stop doing exercises because he thinks he pulled his hamstring.    Currently in Pain? Yes   Pain Score 5    Pain Location Knee   Pain Orientation Right                         [facility] Adult PT Treatment/Exercise - 06/16/15 0001    High Level Balance   High Level Balance Activities Side stepping  Reports that his knee feels like its going back when stepping to his R   Knee/Hip Exercises: Stretches   Quad Stretch 3 reps;10 seconds   Knee/Hip Exercises: Aerobic   Nustep Level 4 x 7 minutes   Knee/Hip Exercises: Machines for Strengthening   Cybex Knee Flexion #20 2x15    Cybex Leg Press #20 2x15    Knee/Hip Exercises: Seated   Long Arc Quad 2 sets;10 reps;AROM   [employer] Squeeze 2x15    Marching Limitations 2x10   Marching Weights 3 lbs.   Abduction/Adduction  2 sets;15 reps   Abd/Adduction Limitations blue tband   Sit to Sand 2 sets;5 reps;with UE support   Modalities   Modalities  Moist Heat;Electrical Stimulation   Moist Heat Therapy   Number Minutes Moist Heat 15 Minutes   Moist Heat Location Knee   Electrical Stimulation   Electrical Stimulation Location right knee   Electrical Stimulation Action IFC   Electrical Stimulation Parameters tolerance   Electrical Stimulation Goals Pain                  PT Short Term Goals - 06/11/15 1136    PT SHORT TERM GOAL #1   Title indepednent with initial HEP   Time 1   Period Weeks   Status New           PT Long Term Goals - 06/11/15 1136    PT LONG TERM GOAL #1   Title walk without assistive device 500 feet   Time 8   Period Weeks   Status New   PT LONG TERM GOAL #2   Title decrease pain 50%   Time 8   Period Weeks   Status New   PT LONG TERM GOAL #3   Title increase AROM of the right knee to 0-110  degrees flexion   Time 8   Period Weeks   Status New   PT LONG TERM GOAL #4   Title ascend/descend stairs reciprocally   Time 8   Period Weeks   Status New               Plan - 06/16/15 1522    Clinical Impression Statement Pt able to tolerated exercises interventions but does report some knee pain across the front of knee. Does report hyper extension of R knee when side stepping to the R.  Pt with a limp when walking    Pt will benefit from skilled therapeutic intervention in order to improve on the following deficits Abnormal gait;Decreased activity tolerance;Decreased mobility;Decreased range of motion;Decreased strength;Difficulty walking;Increased muscle spasms;Impaired flexibility;Pain   Rehab Potential Good   PT Frequency 2x / week   PT Duration 8 weeks   PT Treatment/Interventions Electrical Stimulation;Cryotherapy;Moist Heat;Iontophoresis '4mg'$ /ml Dexamethasone;Therapeutic exercise;Therapeutic activities;Vasopneumatic Device;Manual techniques;Functional mobility training;Stair training;Gait training;ADLs/Self Care Home Management;Ultrasound;Balance training;Passive range of motion;Scar mobilization;Patient/family education   PT Next Visit Plan continue too gently add exercises and work on a more independent gait        Problem List Patient Active Problem List   Diagnosis Date Noted  . Medicare annual wellness visit, subsequent 05/24/2015  . Acute DVT of right tibial vein ([place]) 02/27/2015  . Tibia fracture 02/20/2015  . Tibial fracture 02/18/2015  . Syncope 02/18/2015  . Essential hypertension 02/18/2015  . History of stroke 02/18/2015  . Fracture of tibia, right, closed   . C. difficile colitis 09/11/2013  . UTI (urinary tract infection) 09/11/2013  . Essential hypertension, benign 09/11/2013  . Ileus ([place]) 09/10/2013  . Spastic hemiplegia affecting dominant side ([place]) 09/09/2013  . Ileus, postoperative 09/09/2013  . Hypokalemia 09/09/2013  . Nonspecific  (abnormal) findings on radiological and other examination of gastrointestinal tract 09/09/2013  . CVA (cerebral infarction) 09/05/2013  . 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 Malignant neoplasm of prostate ([place]) 08/29/2013  . Prostate cancer ([place]) 05/22/2013  . Hyponatremia 12/11/2012    Class: Chronic  . Ankle fracture 12/10/2012    Scot Jun, PTA  06/16/2015, 3:39 PM  [address] [place], [place], [address] Phone: [phone]   Fax:  [phone]  Name: Kage Willmann MRN: [id number] Date of Birth: 03-14-1956

## 2015-06-18 ENCOUNTER — Ambulatory Visit: Payer: Medicare Other | Admitting: Physical Therapy

## 2015-06-18 ENCOUNTER — Encounter: Payer: Self-pay | Admitting: Physical Therapy

## 2015-06-18 DIAGNOSIS — M25561 Pain in right knee: Secondary | ICD-10-CM

## 2015-06-18 DIAGNOSIS — M7989 Other specified soft tissue disorders: Secondary | ICD-10-CM

## 2015-06-18 DIAGNOSIS — R262 Difficulty in walking, not elsewhere classified: Secondary | ICD-10-CM | POA: Diagnosis not present

## 2015-06-18 DIAGNOSIS — M25661 Stiffness of right knee, not elsewhere classified: Secondary | ICD-10-CM | POA: Diagnosis not present

## 2015-06-18 NOTE — Therapy (Signed)
[address] [place], [place], [address] Phone: [phone]   Fax:  [phone]  Physical Therapy Treatment  Patient Details  Name: Tanner [name] MRN: [id number] Date of Birth: 1955-09-03 Referring Provider: Rhona Raider  Encounter Date: 06/18/2015      PT End of Session - 06/18/15 1015    Visit Number 3   Date for PT Re-Evaluation 08/12/15   PT Start Time 6962   PT Stop Time 1015   PT Time Calculation (min) 41 min   Activity Tolerance Patient limited by pain;Patient tolerated treatment well   Behavior During Therapy [facility] for tasks assessed/performed      Past Medical History  Diagnosis Date  . Alcoholic hepatitis   . Hypertension   . Shortness of breath     new onset- occasionally-at rest and exertion  . GERD (gastroesophageal reflux disease)   . Anxiety   . TIA (transient ischemic attack) 08/2013  . Arthritis     " in my spine "  . Ileus ([place]) 09/2013  . Substance abuse   . Alcohol abuse   . Prostate cancer ([place]) 05/22/13    Gleason 4+3=7  . Depression   . Allergy   . Stroke ([place])   . DVT (deep venous thrombosis) [facility])     Past Surgical History  Procedure Laterality Date  . Hand surgery Right   . Hernia repair    . Orif ankle fracture Right 12/10/2012    Procedure: OPEN REDUCTION INTERNAL FIXATION (ORIF) ANKLE FRACTURE;  Surgeon: Hessie Dibble, MD;  [facility];  Service: Orthopedics;  Laterality: Right;  . Robot assisted laparoscopic radical prostatectomy N/A 08/29/2013    Procedure: ROBOTIC ASSISTED LAPAROSCOPIC RADICAL PROSTATECTOMY LEVEL 2, Lysis of adhesions;  Surgeon: Molli Hazard, MD;  [facility];  Service: Urology;  Laterality: N/A;  . Lymphadenectomy Bilateral 08/29/2013    Procedure: LYMPHADENECTOMY "BILATERAL PELVIC LYMPH NODE DISSECTION";  Surgeon: Molli Hazard, MD;  [facility];  Service: Urology;  Laterality: Bilateral;    There were no vitals  filed for this visit.  Visit Diagnosis:  Right knee pain  Difficulty walking  Stiffness of right knee  Leg swelling      Subjective Assessment - 06/18/15 0935    Subjective Pt reports that he has been going good, "Just feel like the inside of the bone hurts"   Currently in Pain? Yes   Pain Score 4    Pain Location Knee   Pain Orientation Right            [facility] PT Assessment - 06/18/15 0001    AROM   Overall AROM Comments R knee AROM  -                      [facility] Adult PT Treatment/Exercise - 06/18/15 0001    High Level Balance   High Level Balance Activities Side stepping;Backward walking   Knee/Hip Exercises: Aerobic   Stationary Bike Partial revolutions x4 min    Nustep Level 4 x 6 minutes   Knee/Hip Exercises: Machines for Strengthening   Cybex Knee Flexion #20 2x15    Cybex Leg Press #20 x15; RLE only #10 x15     Knee/Hip Exercises: Standing   Other Standing Knee Exercises Standing  weight shifts 2x10; Standing  march 2x10 single HHA for first set    Knee/Hip Exercises: Seated   Long Arc Quad 2 sets;10 reps;AROM;Weights   Long  Arc Quad Weight 3 lbs.   Sit to Sand 2 sets;5 reps;with UE support                  PT Short Term Goals - 06/11/15 1136    PT SHORT TERM GOAL #1   Title indepednent with initial HEP   Time 1   Period Weeks   Status New           PT Long Term Goals - 06/11/15 1136    PT LONG TERM GOAL #1   Title walk without assistive device 500 feet   Time 8   Period Weeks   Status New   PT LONG TERM GOAL #2   Title decrease pain 50%   Time 8   Period Weeks   Status New   PT LONG TERM GOAL #3   Title increase AROM of the right knee to 0-110 degrees flexion   Time 8   Period Weeks   Status New   PT LONG TERM GOAL #4   Title ascend/descend stairs reciprocally   Time 8   Period Weeks   Status New               Plan - 06/18/15 1016    Clinical Impression Statement Brace removed for today's  treatment. Pt reports that it feels easier to walk without brace. Pt performed balance interventions well, has even maintain his composure with side steps.   Pt will benefit from skilled therapeutic intervention in order to improve on the following deficits Abnormal gait;Decreased activity tolerance;Decreased mobility;Decreased range of motion;Decreased strength;Difficulty walking;Increased muscle spasms;Impaired flexibility;Pain   Rehab Potential Good   PT Frequency 2x / week   PT Duration 8 weeks   PT Treatment/Interventions Electrical Stimulation;Cryotherapy;Moist Heat;Iontophoresis '4mg'$ /ml Dexamethasone;Therapeutic exercise;Therapeutic activities;Vasopneumatic Device;Manual techniques;Functional mobility training;Stair training;Gait training;ADLs/Self Care Home Management;Ultrasound;Balance training;Passive range of motion;Scar mobilization;Patient/family education   PT Next Visit Plan continue too gently add exercises and work on a more independent gait        Problem List Patient Active Problem List   Diagnosis Date Noted  . Medicare annual wellness visit, subsequent 05/24/2015  . Acute DVT of right tibial vein ([place]) 02/27/2015  . Tibia fracture 02/20/2015  . Tibial fracture 02/18/2015  . Syncope 02/18/2015  . Essential hypertension 02/18/2015  . History of stroke 02/18/2015  . Fracture of tibia, right, closed   . C. difficile colitis 09/11/2013  . UTI (urinary tract infection) 09/11/2013  . Essential hypertension, benign 09/11/2013  . Ileus ([place]) 09/10/2013  . Spastic hemiplegia affecting dominant side ([place]) 09/09/2013  . Ileus, postoperative 09/09/2013  . Hypokalemia 09/09/2013  . Nonspecific (abnormal) findings on radiological and other examination of gastrointestinal tract 09/09/2013  . CVA (cerebral infarction) 09/05/2013  . 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 Malignant neoplasm of prostate ([place]) 08/29/2013  . Prostate cancer ([place]) 05/22/2013  . Hyponatremia  12/11/2012    Class: Chronic  . Ankle fracture 12/10/2012    Scot Jun, PTA  06/18/2015, 10:18 AM  [address] [address] [place], [place], [address] Phone: [phone]   Fax:  [phone]  Name: Amaad Byers MRN: [id number] Date of Birth: 1956/05/27

## 2015-06-22 ENCOUNTER — Ambulatory Visit: Payer: Medicare Other | Admitting: Physical Therapy

## 2015-06-23 ENCOUNTER — Encounter: Payer: Self-pay | Admitting: Family

## 2015-06-23 ENCOUNTER — Ambulatory Visit: Payer: Self-pay | Admitting: Family

## 2015-06-25 ENCOUNTER — Ambulatory Visit: Payer: Medicare Other | Admitting: Physical Therapy

## 2015-06-29 ENCOUNTER — Ambulatory Visit: Payer: Medicare Other | Admitting: Physical Therapy

## 2015-06-29 ENCOUNTER — Encounter: Payer: Self-pay | Admitting: Physical Therapy

## 2015-06-29 DIAGNOSIS — R262 Difficulty in walking, not elsewhere classified: Secondary | ICD-10-CM | POA: Diagnosis not present

## 2015-06-29 DIAGNOSIS — M7989 Other specified soft tissue disorders: Secondary | ICD-10-CM | POA: Diagnosis not present

## 2015-06-29 DIAGNOSIS — M25561 Pain in right knee: Secondary | ICD-10-CM | POA: Diagnosis not present

## 2015-06-29 DIAGNOSIS — M25661 Stiffness of right knee, not elsewhere classified: Secondary | ICD-10-CM | POA: Diagnosis not present

## 2015-06-29 NOTE — Therapy (Addendum)
[facility] [address] [place], [place], [address] Phone: [phone]   Fax:  [phone]  Physical Therapy Treatment  Patient Details  Name: Tanner [name] MRN: [id number] Date of Birth: 06/26/1956 Referring Provider: Dalldorf  Encounter Date: 06/29/2015    Past Medical History  Diagnosis Date  . Alcoholic hepatitis   . Hypertension   . Shortness of breath     new onset- occasionally-at rest and exertion  . GERD (gastroesophageal reflux disease)   . Anxiety   . TIA (transient ischemic attack) 08/2013  . Arthritis     " in my spine "  . Ileus (HCC) 09/2013  . Substance abuse   . Alcohol abuse   . 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 Depression   . Allergy   . Stroke (HCC)   . DVT (deep venous thrombosis) (HCC)     Past Surgical History  Procedure Laterality Date  . Hand surgery Right   . Hernia repair    . Orif ankle fracture Right 12/10/2012    Procedure: OPEN REDUCTION INTERNAL FIXATION (ORIF) ANKLE FRACTURE;  Surgeon: Peter G Dalldorf, MD;  Location: [facility];  Service: Orthopedics;  Laterality: Right;  . Robot assisted laparoscopic radical prostatectomy N/A 08/29/2013    Procedure: ROBOTIC ASSISTED LAPAROSCOPIC RADICAL PROSTATECTOMY LEVEL 2, Lysis of adhesions;  Surgeon: Daniel Young Woodruff, MD;  Location: [facility];  Service: Urology;  Laterality: N/A;  . Lymphadenectomy Bilateral 08/29/2013    Procedure: LYMPHADENECTOMY "BILATERAL PELVIC LYMPH NODE DISSECTION";  Surgeon: Daniel Young Woodruff, MD;  [facility];  Service: Urology;  Laterality: Bilateral;    There were no vitals filed for this visit.  Visit Diagnosis:  Right knee pain  Difficulty walking                                 PT Short Term Goals - 06/29/15 1309    PT SHORT TERM GOAL #1   Title indepednent with initial HEP   Status Achieved           PT Long Term Goals - 06/29/15 1310     PT LONG TERM GOAL #1   Title walk without assistive device 500 feet   Status On-going   PT LONG TERM GOAL #2   Title decrease pain 50%   Status On-going   PT LONG TERM GOAL #3   Title increase AROM of the right knee to 0-110 degrees flexion   PT LONG TERM GOAL #4   Title ascend/descend stairs reciprocally   Status On-going               Problem List Patient Active Problem List   Diagnosis Date Noted  . Shortness of breath 07/28/2015  . Generalized anxiety disorder 07/28/2015  . Medicare annual wellness visit, subsequent 05/24/2015  . Acute DVT of right tibial vein (HCC) 02/27/2015  . Tibia fracture 02/20/2015  . Tibial fracture 02/18/2015  . Syncope 02/18/2015  . Essential hypertension 02/18/2015  . History of stroke 02/18/2015  . Fracture of tibia, right, closed   . C. difficile colitis 09/11/2013  . UTI (urinary tract infection) 09/11/2013  . Essential hypertension, benign 09/11/2013  . Ileus (HCC) 09/10/2013  . Spastic hemiplegia affecting dominant side (HCC) 09/09/2013  . Ileus, postoperative 09/09/2013  . Hypokalemia 09/09/2013  . Nonspecific (abnormal) findings on radiological and other examination of gastrointestinal tract 09/09/2013  .   CVA (cerebral infarction) 09/05/2013  . 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 Malignant neoplasm of prostate ([place]) 08/29/2013  . Prostate cancer ([place]) 05/22/2013  . Hyponatremia 12/11/2012    Class: Chronic  . Ankle fracture 12/10/2012  PHYSICAL THERAPY DISCHARGE SUMMARY   Plan: Patient agrees to discharge.  Patient goals were partially met. Patient is being discharged due to not returning since the last visit.  ?????     Lum Babe, PT  Sumner Boast PTA 08/19/2015, 9:25 AM  [address] [place], [place], [address] Phone: [phone]   Fax:  [phone]  Name: Tanner [name] MRN: [id number] Date of Birth:  [birth year]

## 2015-06-30 DIAGNOSIS — M25561 Pain in right knee: Secondary | ICD-10-CM | POA: Diagnosis not present

## 2015-07-01 ENCOUNTER — Ambulatory Visit: Payer: Medicare Other | Admitting: Physical Therapy

## 2015-07-02 ENCOUNTER — Ambulatory Visit: Payer: Medicare Other | Admitting: Physical Therapy

## 2015-07-06 ENCOUNTER — Ambulatory Visit: Payer: Medicare Other | Admitting: Physical Therapy

## 2015-07-09 ENCOUNTER — Ambulatory Visit: Payer: Medicare Other | Admitting: Physical Therapy

## 2015-07-20 ENCOUNTER — Ambulatory Visit: Payer: Medicare Other | Admitting: Physical Therapy

## 2015-07-22 ENCOUNTER — Emergency Department (HOSPITAL_COMMUNITY)
Admission: EM | Admit: 2015-07-22 | Discharge: 2015-07-23 | Disposition: A | Discharge status: Home / Self Care | Payer: Medicare Other | Attending: Emergency Medicine | Admitting: Emergency Medicine

## 2015-07-22 ENCOUNTER — Encounter (HOSPITAL_COMMUNITY): Payer: Self-pay | Admitting: Emergency Medicine

## 2015-07-22 ENCOUNTER — Telehealth: Payer: Self-pay | Admitting: Family

## 2015-07-22 ENCOUNTER — Ambulatory Visit: Payer: Medicare Other | Admitting: Physical Therapy

## 2015-07-22 ENCOUNTER — Emergency Department (HOSPITAL_COMMUNITY): Payer: Medicare Other

## 2015-07-22 DIAGNOSIS — Z8673 Personal history of transient ischemic attack (TIA), and cerebral infarction without residual deficits: Secondary | ICD-10-CM | POA: Diagnosis not present

## 2015-07-22 DIAGNOSIS — Z8546 Personal history of malignant neoplasm of prostate: Secondary | ICD-10-CM | POA: Insufficient documentation

## 2015-07-22 DIAGNOSIS — Z79899 Other long term (current) drug therapy: Secondary | ICD-10-CM | POA: Diagnosis not present

## 2015-07-22 DIAGNOSIS — Z87891 Personal history of nicotine dependence: Secondary | ICD-10-CM | POA: Insufficient documentation

## 2015-07-22 DIAGNOSIS — I10 Essential (primary) hypertension: Secondary | ICD-10-CM | POA: Insufficient documentation

## 2015-07-22 DIAGNOSIS — Z86718 Personal history of other venous thrombosis and embolism: Secondary | ICD-10-CM | POA: Insufficient documentation

## 2015-07-22 DIAGNOSIS — F419 Anxiety disorder, unspecified: Secondary | ICD-10-CM | POA: Diagnosis not present

## 2015-07-22 DIAGNOSIS — K219 Gastro-esophageal reflux disease without esophagitis: Secondary | ICD-10-CM | POA: Insufficient documentation

## 2015-07-22 DIAGNOSIS — F329 Major depressive disorder, single episode, unspecified: Secondary | ICD-10-CM | POA: Diagnosis not present

## 2015-07-22 DIAGNOSIS — R0602 Shortness of breath: Secondary | ICD-10-CM

## 2015-07-22 LAB — BASIC METABOLIC PANEL
ANION GAP: 10 (ref 5–15)
BUN: 12 mg/dL (ref 6–20)
CALCIUM: 8.9 mg/dL (ref 8.9–10.3)
CO2: 23 mmol/L (ref 22–32)
Chloride: 101 mmol/L (ref 101–111)
Creatinine, Ser: 1.19 mg/dL (ref 0.61–1.24)
GFR calc Af Amer: >60 mL/min (ref >60)
GFR calc non Af Amer: >60 mL/min (ref >60)
GLUCOSE: 79 mg/dL (ref 65–99)
Potassium: 4.3 mmol/L (ref 3.5–5.1)
SODIUM: 134 mmol/L — AB (ref 135–145)

## 2015-07-22 LAB — CBC
HEMATOCRIT: 40.4 % (ref 39.0–52.0)
HEMOGLOBIN: 13.2 g/dL (ref 13.0–17.0)
MCH: 30.4 pg (ref 26.0–34.0)
MCHC: 32.7 g/dL (ref 30.0–36.0)
MCV: 93.1 fL (ref 78.0–100.0)
Platelets: 184 10*3/uL (ref 150–400)
RBC: 4.34 MIL/uL (ref 4.22–5.81)
RDW: 15.1 % (ref 11.5–15.5)
WBC: 7.4 10*3/uL (ref 4.0–10.5)

## 2015-07-22 LAB — URINALYSIS, ROUTINE W REFLEX MICROSCOPIC
Bilirubin Urine: NEGATIVE
GLUCOSE, UA: NEGATIVE mg/dL
Hgb urine dipstick: NEGATIVE
Ketones, ur: NEGATIVE mg/dL
LEUKOCYTES UA: NEGATIVE
Nitrite: NEGATIVE
PH: 5.5 (ref 5.0–8.0)
PROTEIN: NEGATIVE mg/dL
Specific Gravity, Urine: 1.007 (ref 1.005–1.030)

## 2015-07-22 LAB — I-STAT TROPONIN, ED: Troponin i, poc: 0 ng/mL (ref 0.00–0.08)

## 2015-07-22 LAB — BRAIN NATRIURETIC PEPTIDE: B Natriuretic Peptide: 56.9 pg/mL (ref 0.0–100.0)

## 2015-07-22 MED ORDER — LORAZEPAM 2 MG/ML IJ SOLN: 1.0000 mg | Freq: Once | INTRAMUSCULAR | Status: DC

## 2015-07-22 NOTE — ED Notes (Addendum)
Pt states for the last couple of weeks he has been getting worsening sob worse with exertion. Pt states he had a blood clot in his right leg in august and was placed on xarelto and still taking it. Pt denies any chest pain.

## 2015-07-22 NOTE — ED Provider Notes (Signed)
CSN: [id number]     Arrival date & time 07/22/15  1554 History   First MD Initiated Contact with Patient 07/22/15 2028     Chief Complaint  Patient presents with  . Shortness of Breath     (Consider location/radiation/quality/duration/timing/severity/associated sxs/prior Treatment) Patient is a 60 y.o. male presenting with shortness of breath. The history is provided by the patient and medical records.  Shortness of Breath  60 year old male with history of hypertension, GERD, anxiety, substance abuse, depression, stroke, right calf DVT in august after orthopedic surgery (currently on xarelto), presenting to the ED for SOB.  Patient states he has noticed this has been getting worse over the past 4 weeks or so.  He states he generally has worsening shortness of breath with exertion-- i.e., carrying groceries in from the car, going up stairs, etc. He states generally he does not have shortness of breath with these activities. He denies any chest pain but states he has had some intermittent palpitations that sometimes occur at rest.  Patient states he has been compliant with his xarelto, has not missed any doses.  Patient denies any known cardiac history. He states he is a former smoker, recently quit. No diagnosis of COPD. He does not use home oxygen.  No fever, chills, cough, or other URI symptoms recently.  VSS.  Past Medical History  Diagnosis Date  . Alcoholic hepatitis   . Hypertension   . Shortness of breath     new onset- occasionally-at rest and exertion  . GERD (gastroesophageal reflux disease)   . Anxiety   . TIA (transient ischemic attack) 08/2013  . Arthritis     " in my spine "  . Ileus ([place]) 09/2013  . Substance abuse   . Alcohol abuse   . Prostate cancer ([place]) 05/22/13    Gleason 4+3=7  . Depression   . Allergy   . Stroke ([place])   . DVT (deep venous thrombosis) [facility])    Past Surgical History  Procedure Laterality Date  . Hand surgery Right   . Hernia repair    . Orif  ankle fracture Right 12/10/2012    Procedure: OPEN REDUCTION INTERNAL FIXATION (ORIF) ANKLE FRACTURE;  Surgeon: Hessie Dibble, MD;  [facility];  Service: Orthopedics;  Laterality: Right;  . Robot assisted laparoscopic radical prostatectomy N/A 08/29/2013    Procedure: ROBOTIC ASSISTED LAPAROSCOPIC RADICAL PROSTATECTOMY LEVEL 2, Lysis of adhesions;  Surgeon: Molli Hazard, MD;  [facility];  Service: Urology;  Laterality: N/A;  . Lymphadenectomy Bilateral 08/29/2013    Procedure: LYMPHADENECTOMY "BILATERAL PELVIC LYMPH NODE DISSECTION";  Surgeon: Molli Hazard, MD;  [facility];  Service: Urology;  Laterality: Bilateral;   Family History  Problem Relation Age of Onset  . Pancreatic cancer Sister   . Lung cancer Father   . Arthritis Mother    Social History  Substance Use Topics  . Smoking status: Former Smoker -- 1.50 packs/day for 20 years    Quit date: 12/21/2012  . Smokeless tobacco: Never Used  . Alcohol Use: 1.8 - 3.6 oz/week    3-6 Cans of beer per week    Review of Systems  Respiratory: Positive for shortness of breath.   All other systems reviewed and are negative.     Allergies  Dilaudid; Oxycodone; Procaine hcl; Zofran; and Sulfa antibiotics  Home Medications   Prior to Admission medications   Medication Sig Start Date End Date Taking? Authorizing Provider  acetaminophen-codeine (TYLENOL #3) 300-30 MG per  tablet Take 1 tablet by mouth every 4 (four) hours as needed for moderate pain. 03/02/15   Lavon Paganini Angiulli, PA-C  clonazePAM (KLONOPIN) 0.5 MG tablet Take 1 tablet (0.5 mg total) by mouth 3 (three) times daily as needed for anxiety. 03/02/15   Lavon Paganini Angiulli, PA-C  FLUoxetine (PROZAC) 20 MG capsule Take 1 capsule (20 mg total) by mouth daily. 03/02/15   Lavon Paganini Angiulli, PA-C  metoprolol (LOPRESSOR) 50 MG tablet Take 1 tablet (50 mg total) by mouth 2 (two) times daily. 03/02/15   Lavon Paganini Angiulli, PA-C  omeprazole (PRILOSEC) 40 MG  capsule Take 1 capsule (40 mg total) by mouth 2 (two) times daily. 03/02/15   Lavon Paganini Angiulli, PA-C  simvastatin (ZOCOR) 20 MG tablet Take 1 tablet (20 mg total) by mouth daily at 6 PM. 03/02/15   Lavon Paganini Angiulli, PA-C  traMADol (ULTRAM) 50 MG tablet Take 1 tablet (50 mg total) by mouth every 6 (six) hours as needed for moderate pain. 03/02/15   Lavon Paganini Angiulli, PA-C  traZODone (DESYREL) 50 MG tablet Take 1 tablet (50 mg total) by mouth at bedtime as needed for sleep. 03/02/15   Daniel J Angiulli, PA-C  XARELTO 20 MG TABS tablet TAKE 1 TABLET BY MOUTH DAILY AS DIRECTED 06/15/15   [name], [name]   BP 152/99 mmHg  Pulse 62  Temp(Src) 98.7 F (37.1 C) (Oral)  Resp 20  SpO2 96%   Physical Exam  Constitutional: He is oriented to person, place, and time. He appears well-developed and well-nourished. No distress.  HENT:  Head: Normocephalic and atraumatic.  Mouth/Throat: Oropharynx is clear and moist.  Eyes: Conjunctivae and EOM are normal. Pupils are equal, round, and reactive to light.  Neck: Normal range of motion. Neck supple.  Cardiovascular: Normal rate, regular rhythm and normal heart sounds.   Pulmonary/Chest: Effort normal and breath sounds normal. No respiratory distress. He has no wheezes. He has no rhonchi.  Lungs overall clear, no distress, speaking in full sentences without difficulty  Abdominal: Soft. Bowel sounds are normal. There is no tenderness. There is no guarding.  Musculoskeletal: Normal range of motion.  No calf asymmetry, tenderness, or palpable cords; no overlying skin changes or warmth to touch; DP pulse intact  Neurological: He is alert and oriented to person, place, and time.  Skin: Skin is warm and dry. He is not diaphoretic.  Psychiatric: He has a normal mood and affect.  Nursing note and vitals reviewed.   ED Course  Procedures (including critical care time) Labs Review Labs Reviewed  BASIC METABOLIC PANEL - Abnormal; Notable for the following:     Sodium 134 (*)    All other components within normal limits  CBC  URINALYSIS, ROUTINE W REFLEX MICROSCOPIC (NOT AT [facility])  BRAIN NATRIURETIC PEPTIDE  Randolm Idol, ED    Imaging Review Dg Chest 2 View  07/22/2015  CLINICAL DATA:  Shortness of breath. EXAM: CHEST  2 VIEW COMPARISON:  February 17, 2015.  September 06, 2013. FINDINGS: The heart size and mediastinal contours are within normal limits. Both lungs are clear. No pneumothorax or pleural effusion is noted. Mild dextroscoliosis of lower thoracic spine is noted. Stable old compression fracture of lower thoracic vertebral body is noted. IMPRESSION: No active cardiopulmonary disease. Electronically Signed   By: Marijo Conception, M.D.   On: 07/22/2015 16:43   Ct Angio Chest Pe W/cm &/or Wo Cm  07/23/2015  CLINICAL DATA:  59 year old male with shortness of breath  and history of pulmonary embolism and DVTs in the past. EXAM: CT ANGIOGRAPHY CHEST WITH CONTRAST TECHNIQUE: Multidetector CT imaging of the chest was performed using the standard protocol during bolus administration of intravenous contrast. Multiplanar CT image reconstructions and MIPs were obtained to evaluate the vascular anatomy. CONTRAST:  129m OMNIPAQUE IOHEXOL 350 MG/ML SOLN COMPARISON:  Chest radiograph dated 07/22/2015 FINDINGS: The lungs are clear. There is no pleural effusion or pneumothorax. Small focal subpleural haziness in the superior segment of the right lower lobe likely represent an area of scarring. The central airways are patent. The thoracic aorta appears unremarkable. No CT evidence of pulmonary embolism. There is no cardiomegaly or pericardial effusion. There is coronary vascular calcification. No hilar, mediastinal, or axillary adenopathy. The esophagus and the thyroid gland appear grossly unremarkable. The chest wall soft tissues appear unremarkable. There is scoliosis with degenerative changes of the spine. Mid thoracic compression deformity and anterior wedging  appears old. No acute fracture. There is apparent diffuse thickening of the gastric wall which may be related to underdistention. Gastritis is not excluded. Clinical correlation is recommended. Review of the MIP images confirms the above findings. IMPRESSION: No CT evidence of pulmonary embolism. Underdistention of stomach versus gastritis. Clinical correlation is recommended. Electronically Signed   By: AAnner CreteM.D.   On: 07/23/2015 01:30   I have personally reviewed and evaluated these images and lab results as part of my medical decision-making.   EKG Interpretation None      MDM   Final diagnoses:  SOB (shortness of breath)   60year old male here with shortness of breath over the past 4 weeks. DVT in right leg in August after surgery, currently on xarelto.  Patient afebrile, non-toxic.  Exam is overall non-infectious.  Lungs clear.  VSS on RA.  No clinical signs of DVT currently, however given hx PE is still a concern.  Lab work overall reassuring.  EKG non-ischemic.  CXR clear.  CTA chest was obtained, no acute findings.  Questionable gastritis noted on CT, however patient has no abdominal pain, nausea, vomiting, GERD sx, or fever.  No sick contacts.  Feel gastritis unlikely clinically.  VS remain stable on RA.  Given negative work-up and stable VS, feel patient stable for discharge.  Has PCP follow-up arranged for next week on 07/28/15-- given copies of imaging studies and labs for physician review.  Discussed plan with patient, he/she acknowledged understanding and agreed with plan of care.  Return precautions given for new or worsening symptoms.  LLarene Pickett PA-C 07/23/15 02725 EDaleen Bo MD 07/23/15 [phone]

## 2015-07-22 NOTE — Telephone Encounter (Signed)
Patient Name: Tanner [name] DOB: 01-27-1956 Initial Comment Caller states he may be having a side effect medications- Head spinning, shortness of breath, heart is racing. He hasn't take any Klonapin since Christmas. He took more than prescribed, so he ran out. He does drink. No appetite. Losing weight. Nurse Assessment Nurse: Ronnald Ramp, RN, [place] Date/Time (Eastern Time): 07/22/2015 2:36:13 PM Confirm and document reason for call. If symptomatic, describe symptoms. ---Caller states for the last 3-4 weeks he has been having dizziness, SOB, and heart racing off and on. No SOB now. Has the patient traveled out of the country within the last 30 days? ---Not Applicable Does the patient have any new or worsening symptoms? ---Yes Will a triage be completed? ---Yes Related visit to physician within the last 2 weeks? ---No Does the PT have any chronic conditions? (i.e. diabetes, asthma, etc.) ---Yes List chronic conditions. ---hx of DVT, Anxiety Is this a behavioral health or substance abuse call? ---No Guidelines Guideline Title Affirmed Question Affirmed Notes Dizziness - Vertigo [1] Dizziness (vertigo) present now AND [2] one or more stroke risk factors (i.e., hypertension, diabetes, prior stroke/TIA/ heart attack) (Exception: prior physician evaluation for this AND no different/worse than usual) Final Disposition User Go to ED Now (or PCP triage) Ronnald Ramp, RN, [facility] - ED Disagree/Comply: Comply

## 2015-07-23 ENCOUNTER — Emergency Department (HOSPITAL_COMMUNITY): Payer: Medicare Other

## 2015-07-23 DIAGNOSIS — R0602 Shortness of breath: Secondary | ICD-10-CM | POA: Diagnosis not present

## 2015-07-23 MED ORDER — IOHEXOL 350 MG/ML SOLN
100.0000 mL | Freq: Once | INTRAVENOUS | Status: AC | PRN
  Administered 2015-07-23: 100 mL via INTRAVENOUS

## 2015-07-23 NOTE — Discharge Instructions (Signed)
Continue taking your xarelto and all other home medications as directed. Follow-up with your primary care physician next week as scheduled Return to the ED for new or worsening symptoms.

## 2015-07-26 DIAGNOSIS — C61 Malignant neoplasm of prostate: Secondary | ICD-10-CM | POA: Diagnosis not present

## 2015-07-28 ENCOUNTER — Encounter: Payer: Self-pay | Admitting: Family

## 2015-07-28 ENCOUNTER — Ambulatory Visit (INDEPENDENT_AMBULATORY_CARE_PROVIDER_SITE_OTHER): Payer: Medicare Other | Admitting: Family

## 2015-07-28 VITALS — BP 140/100 | HR 70 | Temp 98.4°F | Resp 18 | Ht 70.0 in | Wt 169.8 lb

## 2015-07-28 DIAGNOSIS — R0602 Shortness of breath: Secondary | ICD-10-CM | POA: Diagnosis not present

## 2015-07-28 DIAGNOSIS — F411 Generalized anxiety disorder: Secondary | ICD-10-CM

## 2015-07-28 MED ORDER — ALBUTEROL SULFATE HFA 108 (90 BASE) MCG/ACT IN AERS
2.0000 | INHALATION_SPRAY | Freq: Four times a day (QID) | RESPIRATORY_TRACT | Status: DC | PRN
Start: 2015-07-28 — End: 2015-08-16

## 2015-07-28 MED ORDER — FLUOXETINE HCL 40 MG PO CAPS: 40.0000 mg | ORAL_CAPSULE | Freq: Every day | ORAL | Status: DC

## 2015-07-28 NOTE — Assessment & Plan Note (Signed)
Symptoms of shortness of breath may also be related to generalized anxiety disorder that remains labile. Indicates that he has been taking the clonazepam more than what is prescribed. [place] controlled substance database reviewed with 5 refills remaining from previous prescriber and no irregularities. Increase prozac. Discussed stress management techniques and possible need for cognitive behavioral therapy which patient declines currently. Follow up in 1 month or sooner if symptoms do not improve.

## 2015-07-28 NOTE — Progress Notes (Signed)
Pre visit review using our clinic review tool, if applicable. No additional management support is needed unless otherwise documented below in the visit note. 

## 2015-07-28 NOTE — Patient Instructions (Signed)
Thank you for choosing [facility].  Summary/Instructions:  Your prescription(s) have been submitted to your pharmacy or been printed and provided for you. Please take as directed and contact our office if you believe you are having problem(s) with the medication(s) or have any questions.  If your symptoms worsen or fail to improve, please contact our office for further instruction, or in case of emergency go directly to the emergency room at the closest medical facility.   Please continue taking medications as prescribed. Increase Prozac to 40 mg daily. Start albuterol as needed for shortness of breath. They will call and schedule your pulmonary function tests with you.

## 2015-07-28 NOTE — Progress Notes (Signed)
Subjective:    Patient ID: Tanner [name], male    DOB: 26-Mar-1956, 61 y.o.   MRN: [id number]  Chief Complaint  Patient presents with  . Hospitalization Follow-up    Since christmas has been having issues with SOB and nausea in the morning and anxiety has gotten worse, has been doubling up on klonopin and states that the origional dose is not strong enough    HPI:  Tanner [name] is a 60 y.o. male who  has a past medical history of Alcoholic hepatitis; Hypertension; Shortness of breath; GERD (gastroesophageal reflux disease); Anxiety; TIA (transient ischemic attack) (08/2013); Arthritis; Ileus ([place]) (09/2013); Substance abuse; Alcohol abuse; Prostate cancer ([place]) (05/22/13); Depression; Allergy; Stroke [facility]); and DVT (deep venous thrombosis) ([place]). and presents today for a follow up office visit.   Recently evaluated in the emergency department for worsening shortness of breath of about 1 month or so. Currently anticoagulated with Xarelto for a previous DVT following orthopedic surgery. Denied shortness of breath with activities or chest pain. EKG at the time was nonischemic with a clear chest x-ray. CTA of the chest with no acute findings. Vital signs were stable. He was advised to follow-up with primary care. All ED records and tests were reviewed in detail.  Since leaving the emergency department continues to experience associated symptom of shortness of breath and nausea. Notes that his anxiety has worsened and has been doubling up on the Klonopin doses that was previously prescribed to him. Denies any fevers. Does have some shortness of breath with activities. Does have some right lower extremity swelling since his fracture. Continues to take his Xarelto as prescribed and denies adverse side effects or nuisance bleeding.  Allergies  Allergen Reactions  . Dilaudid [Hydromorphone Hcl] Nausea And Vomiting    Pre pt history  . Oxycodone Nausea And Vomiting  . Procaine Hcl Other (See Comments)   Patient states he is allergic to novicaine  . Zofran [Ondansetron] Nausea And Vomiting  . Sulfa Antibiotics Rash     Current Outpatient Prescriptions on File Prior to Visit  Medication Sig Dispense Refill  . acetaminophen-codeine (TYLENOL #3) 300-30 MG per tablet Take 1 tablet by mouth every 4 (four) hours as needed for moderate pain. 60 tablet 0  . clonazePAM (KLONOPIN) 0.5 MG tablet Take 1 tablet (0.5 mg total) by mouth 3 (three) times daily as needed for anxiety. 30 tablet 0  . lisinopril (PRINIVIL,ZESTRIL) 40 MG tablet Take 40 mg by mouth daily.    . metoprolol (LOPRESSOR) 50 MG tablet Take 1 tablet (50 mg total) by mouth 2 (two) times daily. 60 tablet 1  . omeprazole (PRILOSEC) 40 MG capsule Take 1 capsule (40 mg total) by mouth 2 (two) times daily. 60 capsule 1  . simvastatin (ZOCOR) 20 MG tablet Take 1 tablet (20 mg total) by mouth daily at 6 PM. 30 tablet 1  . traMADol (ULTRAM) 50 MG tablet Take 1 tablet (50 mg total) by mouth every 6 (six) hours as needed for moderate pain. 90 tablet 0  . traZODone (DESYREL) 50 MG tablet Take 1 tablet (50 mg total) by mouth at bedtime as needed for sleep. 30 tablet 1  . XARELTO 20 MG TABS tablet TAKE 1 TABLET BY MOUTH DAILY AS DIRECTED 30 tablet 2   No current facility-administered medications on file prior to visit.     Past Surgical History  Procedure Laterality Date  . Hand surgery Right   . Hernia repair    . Orif ankle fracture  Right 12/10/2012    Procedure: OPEN REDUCTION INTERNAL FIXATION (ORIF) ANKLE FRACTURE;  Surgeon: Hessie Dibble, MD;  [facility];  Service: Orthopedics;  Laterality: Right;  . Robot assisted laparoscopic radical prostatectomy N/A 08/29/2013    Procedure: ROBOTIC ASSISTED LAPAROSCOPIC RADICAL PROSTATECTOMY LEVEL 2, Lysis of adhesions;  Surgeon: Molli Hazard, MD;  [facility];  Service: Urology;  Laterality: N/A;  . Lymphadenectomy Bilateral 08/29/2013    Procedure: LYMPHADENECTOMY "BILATERAL PELVIC  LYMPH NODE DISSECTION";  Surgeon: Molli Hazard, MD;  [facility];  Service: Urology;  Laterality: Bilateral;    Past Medical History  Diagnosis Date  . Alcoholic hepatitis   . Hypertension   . Shortness of breath     new onset- occasionally-at rest and exertion  . GERD (gastroesophageal reflux disease)   . Anxiety   . TIA (transient ischemic attack) 08/2013  . Arthritis     " in my spine "  . Ileus ([place]) 09/2013  . Substance abuse   . Alcohol abuse   . Prostate cancer ([place]) 05/22/13    Gleason 4+3=7  . Depression   . Allergy   . Stroke ([place])   . DVT (deep venous thrombosis) ([place])       Review of Systems  Constitutional: Positive for fatigue. Negative for fever and chills.  Respiratory: Positive for cough, shortness of breath and wheezing. Negative for chest tightness.   Cardiovascular: Negative for chest pain, palpitations and leg swelling.  Neurological: Positive for headaches.      Objective:    BP 140/100 mmHg  Pulse 70  Temp(Src) 98.4 F (36.9 C) (Oral)  Resp 18  Ht '5\' 10"'$  (1.778 m)  Wt 169 lb 12.8 oz (77.021 kg)  BMI 24.36 kg/m2  SpO2 96% Nursing note and vital signs reviewed.  Physical Exam  Constitutional: He is oriented to person, place, and time. He appears well-developed and well-nourished. No distress.  HENT:  Right Ear: Hearing, tympanic membrane, external ear and ear canal normal.  Left Ear: Hearing, tympanic membrane, external ear and ear canal normal.  Nose: Nose normal. Right sinus exhibits no maxillary sinus tenderness and no frontal sinus tenderness. Left sinus exhibits no maxillary sinus tenderness and no frontal sinus tenderness.  Mouth/Throat: Uvula is midline, oropharynx is clear and moist and mucous membranes are normal.  Cardiovascular: Normal rate, regular rhythm, normal heart sounds and intact distal pulses.   Pulmonary/Chest: Effort normal. No respiratory distress. He has wheezes. He has no rales.  Neurological: He is  alert and oriented to person, place, and time.  Skin: Skin is warm and dry.  Psychiatric: He has a normal mood and affect. His behavior is normal. Judgment and thought content normal.       Assessment & Plan:   Problem List Items Addressed This Visit      Other   Shortness of breath - Primary    Shortness of breath with concern for potential obstructive pulmonary disease. Recently performed x-ray and CT scans were negative for PE and is anticoagulated with Xarelto. Obtain pulmonary function tests to rule out obstructive disease. Start albuterol. Follow up pending completion of PFT or if symptoms worsen.       Relevant Medications   albuterol (PROVENTIL HFA;VENTOLIN HFA) 108 (90 Base) MCG/ACT inhaler   Other Relevant Orders   Pulmonary function test   Generalized anxiety disorder    Symptoms of shortness of breath may also be related to generalized anxiety disorder that remains labile. Indicates that he  has been taking the clonazepam more than what is prescribed. [place] controlled substance database reviewed with 5 refills remaining from previous prescriber and no irregularities. Increase prozac. Discussed stress management techniques and possible need for cognitive behavioral therapy which patient declines currently. Follow up in 1 month or sooner if symptoms do not improve.       Relevant Medications   FLUoxetine (PROZAC) 40 MG capsule

## 2015-07-28 NOTE — Assessment & Plan Note (Signed)
Shortness of breath with concern for potential obstructive pulmonary disease. Recently performed x-ray and CT scans were negative for PE and is anticoagulated with Xarelto. Obtain pulmonary function tests to rule out obstructive disease. Start albuterol. Follow up pending completion of PFT or if symptoms worsen.

## 2015-07-30 DIAGNOSIS — Z8546 Personal history of malignant neoplasm of prostate: Secondary | ICD-10-CM | POA: Diagnosis not present

## 2015-08-03 ENCOUNTER — Telehealth: Payer: Self-pay | Admitting: Family

## 2015-08-03 NOTE — Telephone Encounter (Signed)
Pt stated that he can afford XARELTO 20 MG TABS tablet anymore because will cost him $364. Please give him call and let him know what Marya Amsler can do for him, he has about 12 pills left

## 2015-08-04 DIAGNOSIS — M25561 Pain in right knee: Secondary | ICD-10-CM | POA: Diagnosis not present

## 2015-08-04 NOTE — Telephone Encounter (Signed)
Any idea of another blood thinner pt can have sent in? Please advise

## 2015-08-04 NOTE — Telephone Encounter (Signed)
Depends on his insurance plan and coverage. Is the medicine covered and this is just his co-pay? Or is the medicine need prior authorization? If so we can do that or switch if another is better covered. He can check with the insurance/xarelto company to see if they offer assistance with co-pay. Most other blood thinners are also this expensive except coumadin (warfarin).

## 2015-08-05 NOTE — Telephone Encounter (Signed)
Called pt back and gave him the idea of warfarin. He does not want to come back every week to get coumadin check. He has 12 xarelto left and says that at his next appointment the end of feb to see if he can get off blood thinner. He wants to see if he can get seen sooner. Any suggestions for pt. Please advise

## 2015-08-05 NOTE — Telephone Encounter (Signed)
Pt has called back regarding the information below Can you please give him a call back this afternoon

## 2015-08-06 NOTE — Telephone Encounter (Signed)
I would recommend he schedule an office visit for [place] to discuss.

## 2015-08-06 NOTE — Telephone Encounter (Signed)
Called and scheduled an appointment with pt.

## 2015-08-16 ENCOUNTER — Encounter: Payer: Self-pay | Admitting: Family

## 2015-08-16 ENCOUNTER — Ambulatory Visit (INDEPENDENT_AMBULATORY_CARE_PROVIDER_SITE_OTHER): Payer: Medicare Other | Admitting: Family

## 2015-08-16 VITALS — BP 144/88 | HR 59 | Temp 97.9°F | Resp 18 | Ht 70.0 in | Wt 171.0 lb

## 2015-08-16 DIAGNOSIS — I82441 Acute embolism and thrombosis of right tibial vein: Secondary | ICD-10-CM | POA: Diagnosis not present

## 2015-08-16 NOTE — Assessment & Plan Note (Signed)
Stable and anticoagulated with Xarelto. Does have difficulties affording medication. Sample of's are also provided. Obtain lower extremity venous Dopplers to check current status. Continue current dosages relative pending Dopplers. Discussed potential need for long-term anticoagulation if blood clot remains. Follow-up if symptoms worsen prior to venous Dopplers.

## 2015-08-16 NOTE — Progress Notes (Signed)
Subjective:    Patient ID: Tanner [name], male    DOB: 1955/12/17, 60 y.o.   MRN: [id number]  Chief Complaint  Patient presents with  . Follow-up    trying to see what to do about blood thinner, insurance will not cover xarelto     HPI:  Tanner [name] is a 60 y.o. male who  has a past medical history of Alcoholic hepatitis; Hypertension; Shortness of breath; GERD (gastroesophageal reflux disease); Anxiety; TIA (transient ischemic attack) (08/2013); Arthritis; Ileus ([place]) (09/2013); Substance abuse; Alcohol abuse; Prostate cancer ([place]) (05/22/13); Depression; Allergy; Stroke [facility]); and DVT (deep venous thrombosis) ([place]). and presents today for a follow up office visit.   Previously diagnosed with a DVT in August and has been anticoagulated with Xarelto. Takes the medication as prescribed and denies adverse side effects and does not the occasional bright red blood from hemorrhoids. Does have some shortness of breath with no chest pain, calf pain or heart palpitations. About 2-3 months from completion of a 6 month course.   Allergies  Allergen Reactions  . Dilaudid [Hydromorphone Hcl] Nausea And Vomiting    Pre pt history  . Oxycodone Nausea And Vomiting  . Procaine Hcl Other (See Comments)    Patient states he is allergic to novicaine  . Zofran [Ondansetron] Nausea And Vomiting  . Sulfa Antibiotics Rash     Current Outpatient Prescriptions on File Prior to Visit  Medication Sig Dispense Refill  . clonazePAM (KLONOPIN) 0.5 MG tablet Take 1 tablet (0.5 mg total) by mouth 3 (three) times daily as needed for anxiety. 30 tablet 0  . FLUoxetine (PROZAC) 40 MG capsule Take 1 capsule (40 mg total) by mouth daily. 30 capsule 1  . lisinopril (PRINIVIL,ZESTRIL) 40 MG tablet Take 40 mg by mouth daily.    . metoprolol (LOPRESSOR) 50 MG tablet Take 1 tablet (50 mg total) by mouth 2 (two) times daily. 60 tablet 1  . omeprazole (PRILOSEC) 40 MG capsule Take 1 capsule (40 mg total) by mouth 2 (two)  times daily. 60 capsule 1  . simvastatin (ZOCOR) 20 MG tablet Take 1 tablet (20 mg total) by mouth daily at 6 PM. 30 tablet 1  . XARELTO 20 MG TABS tablet TAKE 1 TABLET BY MOUTH DAILY AS DIRECTED 30 tablet 2   No current facility-administered medications on file prior to visit.     Review of Systems  Constitutional: Negative for fever and chills.  Respiratory: Positive for shortness of breath. Negative for chest tightness.   Cardiovascular: Negative for chest pain, palpitations and leg swelling.  Gastrointestinal: Positive for blood in stool.  Hematological: Does not bruise/bleed easily.      Objective:    BP 144/88 mmHg  Pulse 59  Temp(Src) 97.9 F (36.6 C) (Oral)  Resp 18  Ht '5\' 10"'$  (1.778 m)  Wt 171 lb (77.565 kg)  BMI 24.54 kg/m2  SpO2 97% Nursing note and vital signs reviewed.  Physical Exam  Constitutional: He is oriented to person, place, and time. He appears well-developed and well-nourished. No distress.  Cardiovascular: Normal rate, regular rhythm, normal heart sounds and intact distal pulses.   Pulmonary/Chest: Effort normal and breath sounds normal.  Musculoskeletal:  Right calf - no obvious deformity, discoloration, or edema. No palpable tenderness able to be elicited. Distal pulses, sensation, and reflexes are intact and appropriate. Negative Homans sign.  Neurological: He is alert and oriented to person, place, and time.  Skin: Skin is warm and dry.  Psychiatric: He has a  normal mood and affect. His behavior is normal. Judgment and thought content normal.       Assessment & Plan:   Problem List Items Addressed This Visit      Cardiovascular and Mediastinum   Acute DVT of right tibial vein (HCC) - Primary    Stable and anticoagulated with Xarelto. Does have difficulties affording medication. Sample of's are also provided. Obtain lower extremity venous Dopplers to check current status. Continue current dosages relative pending Dopplers. Discussed potential  need for long-term anticoagulation if blood clot remains. Follow-up if symptoms worsen prior to venous Dopplers.      Relevant Orders   VAS [place] LOWER EXTREMITY VENOUS (DVT)

## 2015-08-16 NOTE — Patient Instructions (Addendum)
Thank you for choosing [facility].  Summary/Instructions:  They will call to schedule your ultrasound for your leg.  Continue to take your medications as prescribed.  Your prescription(s) have been submitted to your pharmacy or been printed and provided for you. Please take as directed and contact our office if you believe you are having problem(s) with the medication(s) or have any questions.  If your symptoms worsen or fail to improve, please contact our office for further instruction, or in case of emergency go directly to the emergency room at the closest medical facility.   Warfarin tablets What is this medicine? WARFARIN (WAR far in) is an anticoagulant. It is used to treat or prevent clots in the veins, arteries, lungs, or heart. This medicine may be used for other purposes; ask your health care provider or pharmacist if you have questions. What should I tell my health care provider before I take this medicine? They need to know if you have any of these conditions: -alcoholism -anemia -bleeding disorders -cancer -diabetes -heart disease -high blood pressure -history of bleeding in the gastrointestinal tract -history of stroke or other brain injury or disease -kidney or liver disease -protein C deficiency -protein S deficiency -psychosis or dementia -recent injury, recent or planned surgery or procedure -an unusual or allergic reaction to warfarin, other medicines, foods, dyes, or preservatives -pregnant or trying to get pregnant -breast-feeding How should I use this medicine? Take this medicine by mouth with a glass of water. Follow the directions on the prescription label. You can take this medicine with or without food. Take your medicine at the same time each day. Do not take it more often than directed. Do not stop taking except on your doctor's advice. Stopping this medicine may increase your risk of a blood clot. Be sure to refill your prescription before you  run out of medicine. If your doctor or healthcare professional calls to change your dose, write down the dose and any other instructions. Always read the dose and instructions back to him or her to make sure you understand them. Tell your doctor or healthcare professional what strength of tablets you have on hand. Ask how many tablets you should take to equal your new dose. Write the date on the new instructions and keep them near your medicine. If you are told to stop taking your medicine until your next blood test, call your doctor or healthcare professional if you do not hear anything within 24 hours of the test to find out your new dose or when to restart your prior dose. A special MedGuide will be given to you by the pharmacist with each prescription and refill. Be sure to read this information carefully each time. Talk to your pediatrician regarding the use of this medicine in children. Special care may be needed. Overdosage: If you think you have taken too much of this medicine contact a poison control center or emergency room at once. NOTE: This medicine is only for you. Do not share this medicine with others. What if I miss a dose? It is important not to miss a dose. If you miss a dose, call your healthcare provider. Take the dose as soon as possible on the same day. If it is almost time for your next dose, take only that dose. Do not take double or extra doses to make up for a missed dose. What may interact with this medicine? Do not take this medicine with any of the following medications: -agents that prevent or  dissolve blood clots -aspirin or other salicylates -danshen -dextrothyroxine -mifepristone -St. John's Wort -red yeast rice This medicine may also interact with the following medications: -acetaminophen -agents that lower cholesterol -alcohol -allopurinol -amiodarone -antibiotics or medicines for treating bacterial, fungal or viral infections -azathioprine -barbiturate  medicines for inducing sleep or treating seizures -certain medicines for diabetes -certain medicines for heart rhythm problems -certain medicines for high blood pressure -chloral hydrate -cisapride -disulfiram -male hormones, including contraceptive or birth control pills -general anesthetics -herbal or dietary products like garlic, ginkgo, ginseng, green tea, or kava kava -influenza virus vaccine -male hormones -medicines for mental depression or psychosis -medicines for some types of cancer -medicines for stomach problems -methylphenidate -NSAIDs, medicines for pain and inflammation, like ibuprofen or naproxen -propoxyphene -quinidine, quinine -raloxifene -seizure or epilepsy medicine like carbamazepine, phenytoin, and valproic acid -steroids like cortisone and prednisone -tamoxifen -thyroid medicine -tramadol -vitamin c, vitamin e, and vitamin K -zafirlukast -zileuton This list may not describe all possible interactions. Give your health care provider a list of all the medicines, herbs, non-prescription drugs, or dietary supplements you use. Also tell them if you smoke, drink alcohol, or use illegal drugs. Some items may interact with your medicine. What should I watch for while using this medicine? Visit your doctor or health care professional for regular checks on your progress. You will need to have a blood test called a PT/INR regularly. The PT/INR blood test is done to make sure you are getting the right dose of this medicine. It is important to not miss your appointment for the blood tests. When you first start taking this medicine, these tests are done often. Once the correct dose is determined and you take your medicine properly, these tests can be done less often. Notify your doctor or health care professional and seek emergency treatment if you develop breathing problems; changes in vision; chest pain; severe, sudden headache; pain, swelling, warmth in the leg; trouble  speaking; sudden numbness or weakness of the face, arm or leg. These can be signs that your condition has gotten worse. While you are taking this medicine, carry an identification card with your name, the name and dose of medicine(s) being used, and the name and phone number of your doctor or health care professional or person to contact in an emergency. Do not start taking or stop taking any medicines or over-the-counter medicines except on the advice of your doctor or health care professional. You should discuss your diet with your doctor or health care professional. Do not make major changes in your diet. Vitamin K can affect how well this medicine works. Many foods contain vitamin K. It is important to eat a consistent amount of foods with vitamin K. Other foods with vitamin K that you should eat in consistent amounts are asparagus, basil, beef or pork liver, black eyed peas, broccoli, brussel sprouts, cabbage, chickpeas, cucumber with peel, green onions, green tea, okra, parsley, peas, thyme, and green leafy vegetables like beet greens, collard greens, endive, kale, mustard greens, spinach, turnip greens, watercress, or certain lettuces like green leaf or romaine. This medicine can cause birth defects or bleeding in an unborn child. Women of childbearing age should use effective birth control while taking this medicine. If a woman becomes pregnant while taking this medicine, she should discuss the potential risks and her options with her health care professional. Avoid sports and activities that might cause injury while you are using this medicine. Severe falls or injuries can cause unseen bleeding. Be careful  when using sharp tools or knives. Consider using an Copy. Take special care brushing or flossing your teeth. Report any injuries, bruising, or red spots on the skin to your doctor or health care professional. If you have an illness that causes vomiting, diarrhea, or fever for more than a  few days, contact your doctor. Also check with your doctor if you are unable to eat for several days. These problems can change the effect of this medicine. Even after you stop taking this medicine, it takes several days before your body recovers its normal ability to clot blood. Ask your doctor or health care professional how long you need to be careful. If you are going to have surgery or dental work, tell your doctor or health care professional that you have been taking this medicine. What side effects may I notice from receiving this medicine? Side effects that you should report to your doctor or health care professional as soon as possible: -back pain -chills -dizziness -fever -heavy menstrual bleeding or vaginal bleeding -painful, blue, or purple toes -painful, prolonged erection -signs and symptoms of bleeding such as bloody or black, tarry stools; red or dark-brown urine; spitting up blood or brown material that looks like coffee grounds; red spots on the skin; unusual bruising or bleeding from the eye, gums, or nose-skin rash, itching or skin damage -stomach pain -unusually weak or tired -yellowing of skin or eyes Side effects that usually do not require medical attention (report to your doctor or health care professional if they continue or are bothersome): -diarrhea -hair loss This list may not describe all possible side effects. Call your doctor for medical advice about side effects. You may report side effects to FDA at 1-800-FDA-1088. Where should I keep my medicine? Keep out of the reach of children. Store at room temperature between 15 and 30 degrees C (59 and 86 degrees F). Protect from light. Throw away any unused medicine after the expiration date. Do not flush down the toilet. NOTE: This sheet is a summary. It may not cover all possible information. If you have questions about this medicine, talk to your doctor, pharmacist, or health care provider.    2016, Elsevier/[employer]. (2013-01-15 12:17:56)

## 2015-08-16 NOTE — Progress Notes (Signed)
Pre visit review using our clinic review tool, if applicable. No additional management support is needed unless otherwise documented below in the visit note. 

## 2015-08-18 ENCOUNTER — Telehealth: Payer: Self-pay

## 2015-08-18 ENCOUNTER — Ambulatory Visit (HOSPITAL_COMMUNITY)
Admission: RE | Admit: 2015-08-18 | Discharge: 2015-08-18 | Disposition: A | Discharge status: Home / Self Care | Payer: Medicare Other | Source: Ambulatory Visit | Attending: Internal Medicine | Admitting: Internal Medicine

## 2015-08-18 DIAGNOSIS — I82441 Acute embolism and thrombosis of right tibial vein: Secondary | ICD-10-CM

## 2015-08-18 DIAGNOSIS — I10 Essential (primary) hypertension: Secondary | ICD-10-CM | POA: Diagnosis not present

## 2015-08-18 NOTE — Telephone Encounter (Signed)
Pharmacy is requesting a new rx for clonazepam. Last refill was 03/02/15

## 2015-08-19 ENCOUNTER — Encounter: Payer: Self-pay | Admitting: Family

## 2015-08-19 MED ORDER — CLONAZEPAM 0.5 MG PO TABS: 0.5000 mg | ORAL_TABLET | Freq: Three times a day (TID) | ORAL | Status: DC | PRN

## 2015-08-19 NOTE — Telephone Encounter (Signed)
Medication refilled

## 2015-08-30 ENCOUNTER — Encounter: Payer: Self-pay | Admitting: Family

## 2015-08-30 ENCOUNTER — Ambulatory Visit (INDEPENDENT_AMBULATORY_CARE_PROVIDER_SITE_OTHER): Payer: Medicare Other | Admitting: Family

## 2015-08-30 VITALS — BP 168/100 | HR 68 | Temp 98.2°F | Resp 16 | Ht 70.0 in | Wt 163.0 lb

## 2015-08-30 DIAGNOSIS — I82441 Acute embolism and thrombosis of right tibial vein: Secondary | ICD-10-CM

## 2015-08-30 NOTE — Progress Notes (Signed)
Pre visit review using our clinic review tool, if applicable. No additional management support is needed unless otherwise documented below in the visit note. 

## 2015-08-30 NOTE — Assessment & Plan Note (Signed)
Symptoms and exam appear resolved with completion of approximately 6 month course of anticoagulation. No evidence of DVT noted on most recent lower extremity Dopplers. Discontinue Xarleto and continue to monitor. Signs and symptoms of DVT were explained to patient.

## 2015-08-30 NOTE — Patient Instructions (Signed)
Thank you for choosing [facility].  Summary/Instructions:  Please STOP taking the Xarelto.   If your symptoms worsen or fail to improve, please contact our office for further instruction, or in case of emergency go directly to the emergency room at the closest medical facility.   Follow up with pulmonology for breathing tests.   Pulmonary Function Tests Pulmonary function tests (PFTs) measure how well your lungs are working. The tests can help to identify the causes of lung problems. They can also help your health care provider select the best treatment for you. Your health care provider may order pulmonary function for any of the following reasons:  When an illness involving the lungs is suspected.  To follow changes in your lung function over time if you are known to have a chronic lung disease.  For industrial plant workers to examine the effects of being exposed to chemicals over a long period of time.  To assess lung function prior to surgery or other procedures.  For people who are smokers. Your measured lung function will be compared to the expected lung function of someone with healthy lungs who is similar to you in age, gender, size, and other factors. This is used to determine your "percent predicted" lung function, which is how your health care provider knows if your lung function is normal or abnormal. If you have had prior pulmonary function testing performed, your health care provider will also compare your current results with past tests to see if your lung function is better, worse, or staying the same. This can sometimes be useful to see if treatments are working.  LET [facility] CARE PROVIDER KNOW ABOUT:  Any allergies you have.  All medicines you are taking, including inhaler or nebulizer medicines, vitamins, herbs, eye drops, creams, and over-the-counter medicines.  Any blood disorders you have.  Previous surgeries you have had, especially recent eye  surgery, abdominal surgery, or chest surgery. These can make performing pulmonary function tests difficult or unsafe.  Medical conditions you have.  Chest pain or heart problems.  Tuberculosis or respiratory infections, such as pneumonia, a cold, or the flu. If you think you will have difficulty performing any of the breathing maneuvers, ask your health care provider if you should reschedule the test. RISKS AND COMPLICATIONS: Generally, pulmonary function testing is a safe procedure. However, as with any procedure, complications can occur. Possible complications include:  Lightheadedness due to overbreathing (hyperventilation).  An asthmatic attack from deep breathing. BEFORE THE PROCEDURE  Take medicine as directed by your health care provider. If you take inhaler or nebulizer medicines, ask your health care provider which medicines you should take on the day of your test. Some inhaler medicines may interfere with pulmonary function tests, such as bronchodilator testing, if taken shortly before the test.  Avoid eating a large meal before your test.  Do not smoke before your test.  Wear comfortable clothing which will not interfere with breathing. PROCEDURE  You will be given a soft nose clip to wear during the procedure. This is done so that all of your breaths will go through your mouth instead of your nose.  You will be given a germ-free (sterile) mouthpiece. It will be attached to a spirometer. The spirometer is the machine that measures your breathing.  You will be instructed to perform various breathing maneuvers. The maneuvers will be done by breathing in (inhaling) and breathing out (exhaling). Depending on what measurements are ordered, you may be asked to repeat the  maneuvers several times before the test is completed.  It is important to follow the instructions exactly to obtain accurate results. Make sure to blow as hard and as fast as you can when you are instructed to do  so.  You may be given a bronchodilator after testing has been performed. A bronchodilator is a medicine which makes the small air passages in your lungs larger. These medicines usually make it easier to breathe. The tests are then repeated several minutes later after the bronchodilator has taken effect.  You will be monitored carefully during the procedure for faintness, dizziness, difficulty breathing, or any other problems. AFTER THE PROCEDURE   You may resume your usual diet, medicines, and activities as directed by your health care provider.  Your health care provider will go over your test results with you and determine what treatments may be helpful.   This information is not intended to replace advice given to you by your health care provider. Make sure you discuss any questions you have with your health care provider.   Document Released: 02/17/2004 Document Revised: 04/16/2013 Document Reviewed: 01/23/2013 Elsevier Interactive Patient Education [employer].

## 2015-08-30 NOTE — Progress Notes (Signed)
Subjective:    Patient ID: Tanner [name], male    DOB: [birth year], 60 y.o.   MRN: [id number]  Chief Complaint  Patient presents with  . Follow-up    had US done on both legs, wanted to make sure blood clots were gone and he can get off xarelto    HPI:  Tanner [name] is a 60 y.o. male who  has a past medical history of Alcoholic hepatitis; Hypertension; Shortness of breath; GERD (gastroesophageal reflux disease); Anxiety; TIA (transient ischemic attack) (08/2013); Arthritis; Ileus ([place]) (09/2013); Substance abuse; Alcohol abuse; Prostate cancer ([place]) (05/22/13); Depression; Allergy; Stroke [facility]); and DVT (deep venous thrombosis) ([place]). and presents today for a follow up office visit.  DVT - previously diagnosed with DVT located in the right lower extremity secondary to trauma. Currently maintained on Xarelto with no adverse side effects or nuisance bleeding. Takes medication as prescribed. Recently completed bilateral lower extremity Dopplers which were found to be negative for any evidence of blood clots.  Allergies  Allergen Reactions  . Dilaudid [Hydromorphone Hcl] Nausea And Vomiting    Pre pt history  . Oxycodone Nausea And Vomiting  . Procaine Hcl Other (See Comments)    Patient states he is allergic to novicaine  . Zofran [Ondansetron] Nausea And Vomiting  . Sulfa Antibiotics Rash     Current Outpatient Prescriptions on File Prior to Visit  Medication Sig Dispense Refill  . clonazePAM (KLONOPIN) 0.5 MG tablet Take 1 tablet (0.5 mg total) by mouth 3 (three) times daily as needed for anxiety. 90 tablet 0  . FLUoxetine (PROZAC) 40 MG capsule Take 1 capsule (40 mg total) by mouth daily. 30 capsule 1  . lisinopril (PRINIVIL,ZESTRIL) 40 MG tablet Take 40 mg by mouth daily.    . metoprolol (LOPRESSOR) 50 MG tablet Take 1 tablet (50 mg total) by mouth 2 (two) times daily. 60 tablet 1  . omeprazole (PRILOSEC) 40 MG capsule Take 1 capsule (40 mg total) by mouth 2 (two) times daily. 60  capsule 1  . simvastatin (ZOCOR) 20 MG tablet Take 1 tablet (20 mg total) by mouth daily at 6 PM. 30 tablet 1  . traZODone (DESYREL) 50 MG tablet Take 50 mg by mouth at bedtime.     No current facility-administered medications on file prior to visit.     Past Surgical History  Procedure Laterality Date  . Hand surgery Right   . Hernia repair    . Orif ankle fracture Right 12/10/2012    Procedure: OPEN REDUCTION INTERNAL FIXATION (ORIF) ANKLE FRACTURE;  Surgeon: Hessie Dibble, MD;  Location: [facility];  Service: Orthopedics;  Laterality: Right;  . Robot assisted laparoscopic radical prostatectomy N/A 08/29/2013    Procedure: ROBOTIC ASSISTED LAPAROSCOPIC RADICAL PROSTATECTOMY LEVEL 2, Lysis of adhesions;  Surgeon: Molli Hazard, MD;  Location: [facility];  Service: Urology;  Laterality: N/A;  . Lymphadenectomy Bilateral 08/29/2013    Procedure: LYMPHADENECTOMY "BILATERAL PELVIC LYMPH NODE DISSECTION";  Surgeon: Molli Hazard, MD;  Location: [facility];  Service: Urology;  Laterality: Bilateral;     Review of Systems  Constitutional: Negative for fever and chills.  Respiratory: Positive for wheezing. Negative for cough and chest tightness.   Cardiovascular: Negative for chest pain, palpitations and leg swelling.  Musculoskeletal:       Negative for lower extremity pain.       Objective:    BP 168/100 mmHg  Pulse 68  Temp(Src) 98.2 F (36.8 C) (Oral)  Resp 16  Ht  $'5\' 10"'v$  (1.778 m)  Wt 163 lb (73.936 kg)  BMI 23.39 kg/m2  SpO2 97% Nursing note and vital signs reviewed.  Physical Exam  Constitutional: He is oriented to person, place, and time. He appears well-developed and well-nourished. No distress.  Cardiovascular: Normal rate, regular rhythm, normal heart sounds and intact distal pulses.   Pulmonary/Chest: Effort normal and breath sounds normal.  Musculoskeletal:  Bilateral lower extremities  - No obvious deformity, discoloration, or edema. No tenderness able to be  elicited. Distal pulses and sensation are intact and appropriate.  Neurological: He is alert and oriented to person, place, and time.  Skin: Skin is warm and dry.  Psychiatric: He has a normal mood and affect. His behavior is normal. Judgment and thought content normal.       Assessment & Plan:   Problem List Items Addressed This Visit      Cardiovascular and Mediastinum   Acute DVT of right tibial vein (HCC) - Primary    Symptoms and exam appear resolved with completion of approximately 6 month course of anticoagulation. No evidence of DVT noted on most recent lower extremity Dopplers. Discontinue Xarleto and continue to monitor. Signs and symptoms of DVT were explained to patient.

## 2015-09-10 DIAGNOSIS — Z125 Encounter for screening for malignant neoplasm of prostate: Secondary | ICD-10-CM | POA: Diagnosis not present

## 2015-09-10 DIAGNOSIS — E785 Hyperlipidemia, unspecified: Secondary | ICD-10-CM | POA: Diagnosis not present

## 2015-09-10 DIAGNOSIS — D559 Anemia due to enzyme disorder, unspecified: Secondary | ICD-10-CM | POA: Diagnosis not present

## 2015-09-16 ENCOUNTER — Telehealth: Payer: Self-pay

## 2015-09-16 MED ORDER — CLONAZEPAM 0.5 MG PO TABS: 0.5000 mg | ORAL_TABLET | Freq: Three times a day (TID) | ORAL | Status: DC | PRN

## 2015-09-16 NOTE — Telephone Encounter (Signed)
i recd faxed rx request for clonazepam from [facility] for this patient----please advise, thanks

## 2015-09-17 NOTE — Telephone Encounter (Signed)
rx faxed to [facility]

## 2015-10-12 ENCOUNTER — Other Ambulatory Visit: Payer: Self-pay | Admitting: Family

## 2015-10-12 NOTE — Telephone Encounter (Signed)
faxed script back to [facility]...Tanner [name]

## 2015-12-11 ENCOUNTER — Other Ambulatory Visit: Payer: Self-pay | Admitting: Family

## 2015-12-22 ENCOUNTER — Other Ambulatory Visit: Payer: Self-pay | Admitting: Family

## 2015-12-24 NOTE — Telephone Encounter (Signed)
Faxed script back to [facility]...Johny Chess

## 2016-01-17 DIAGNOSIS — Z8546 Personal history of malignant neoplasm of prostate: Secondary | ICD-10-CM | POA: Diagnosis not present

## 2016-01-20 ENCOUNTER — Other Ambulatory Visit: Payer: Self-pay | Admitting: Family

## 2016-01-21 ENCOUNTER — Other Ambulatory Visit: Payer: Self-pay | Admitting: Family

## 2016-01-24 DIAGNOSIS — N529 Male erectile dysfunction, unspecified: Secondary | ICD-10-CM | POA: Diagnosis not present

## 2016-01-24 DIAGNOSIS — N393 Stress incontinence (female) (male): Secondary | ICD-10-CM | POA: Diagnosis not present

## 2016-01-24 DIAGNOSIS — Z8546 Personal history of malignant neoplasm of prostate: Secondary | ICD-10-CM | POA: Diagnosis not present

## 2016-01-25 ENCOUNTER — Other Ambulatory Visit: Payer: Self-pay | Admitting: Family

## 2016-01-27 ENCOUNTER — Ambulatory Visit: Payer: Medicare Other | Admitting: Family

## 2016-01-31 ENCOUNTER — Ambulatory Visit: Payer: Self-pay | Admitting: Family Medicine

## 2016-01-31 ENCOUNTER — Telehealth: Payer: Self-pay | Admitting: Family

## 2016-01-31 NOTE — Telephone Encounter (Signed)
[facility]  Patient Name: Tanner [name]  DOB: October 09, 1955    Initial Comment Caller states bp running high in the morning and low at night. Takes bp med twice a day. Wants to know if he still needs to take bp meds at night when it's running low. No other sx. Bp 170/106 taken 30 mins ago   Nurse Assessment  Nurse: Harlow Mares, RN, Suanne Marker Date/Time Eilene Ghazi Time): 01/31/2016 3:12:42 PM  Confirm and document reason for call. If symptomatic, describe symptoms. You must click the next button to save text entered. ---Caller states bp running high in the morning and low at night. Takes bp med twice a day. Wants to know if he still needs to take bp meds at night when it's running low. No other sx. Bp 170/106 taken 30 mins ago. Reports that he went to his urologist last week and it was 180/108. He states that his bp is low at night (105/61) next morning 161/98. Reports that he takes Metoprolol BID. He also takes Lisonopril. Reports that he has an appt with Dr. Elna Breslow this Thursday for a follow up. Caller denies any symptoms at this times.  Has the patient traveled out of the country within the last 30 days? ---Not Applicable  Does the patient have any new or worsening symptoms? ---Yes  Will a triage be completed? ---Yes  Related visit to physician within the last 2 weeks? ---No  Does the PT have any chronic conditions? (i.e. diabetes, asthma, etc.) ---Yes  List chronic conditions. ---HTN, hx stroke, hx prostate cancer, hx c-diff  Is this a behavioral health or substance abuse call? ---No     Guidelines    Guideline Title Affirmed Question Affirmed Notes  High Blood Pressure Systolic BP >= 794 OR Diastolic >= 327    Final Disposition User   See PCP When Office is Open (within 3 days) Harlow Mares, RN, Rhonda    Referrals  REFERRED TO PCP OFFICE   Disagree/Comply: Leta Baptist

## 2016-02-03 ENCOUNTER — Encounter: Payer: Self-pay | Admitting: Family

## 2016-02-03 ENCOUNTER — Ambulatory Visit (INDEPENDENT_AMBULATORY_CARE_PROVIDER_SITE_OTHER): Payer: Medicare Other | Admitting: Family

## 2016-02-03 DIAGNOSIS — I160 Hypertensive urgency: Secondary | ICD-10-CM | POA: Insufficient documentation

## 2016-02-03 MED ORDER — AMLODIPINE BESYLATE 10 MG PO TABS: 10.0000 mg | ORAL_TABLET | Freq: Every day | ORAL | 0 refills | Status: DC

## 2016-02-03 MED ORDER — METOPROLOL TARTRATE 50 MG PO TABS: 100.0000 mg | ORAL_TABLET | Freq: Two times a day (BID) | ORAL | 1 refills | Status: DC

## 2016-02-03 NOTE — Patient Instructions (Signed)
Thank you for choosing [facility].  Summary/Instructions:   Please take 100 mg of the metoprolol 2x daily.  Please continue to take the lisinopril as prescribed.  Start taking the amlodipine daily.  Follow up for a nurse visit in 1 week.   Check on the price of Bystolic.   If your symptoms worsen or fail to improve, please contact our office for further instruction, or in case of emergency go directly to the emergency room at the closest medical facility.

## 2016-02-03 NOTE — Progress Notes (Signed)
Subjective:    Patient ID: Tanner [name], male    DOB: 1955-10-02, 60 y.o.   MRN: [id number]  Chief Complaint  Patient presents with  . Hypertension    states blood pressure has been running high    HPI:  Tanner [name] is a 60 y.o. male who  has a past medical history of Alcohol abuse; Alcoholic hepatitis; Allergy; Anxiety; Arthritis; Depression; DVT (deep venous thrombosis) ([place]); GERD (gastroesophageal reflux disease); Hypertension; Ileus ([place]) (09/2013); Prostate cancer ([place]) (05/22/13); Shortness of breath; Stroke ([place]); Substance abuse; and TIA (transient ischemic attack) (08/2013). and presents today For a follow-up office visit.  1.) Hypertension - This is a chronic problem. Currently maintained on lisinopril and metoprolol. Reports taking medication as prescribed and denies adverse side effects. Notes home blood sugars have been remaining elevated. He recently took his blood pressure medications approximately one hour ago this morning. Endorses a headache that he describes as pulsating on occasion.  BP Readings from Last 3 Encounters:  02/03/16 (!) 170/98  08/30/15 (!) 168/100  08/16/15 (!) 144/88    Allergies  Allergen Reactions  . Dilaudid [Hydromorphone Hcl] Nausea And Vomiting    Pre pt history  . Oxycodone Nausea And Vomiting  . Procaine Hcl Other (See Comments)    Patient states he is allergic to novicaine  . Zofran [Ondansetron] Nausea And Vomiting  . Sulfa Antibiotics Rash     Current Outpatient Prescriptions on File Prior to Visit  Medication Sig Dispense Refill  . clonazePAM (KLONOPIN) 0.5 MG tablet TAKE 1 TABLET BY MOUTH 3 TIMES DAILY AS NEEDED FOR ANXIETY 90 tablet 1  . FLUoxetine (PROZAC) 40 MG capsule TAKE 1 CAPSULE BY MOUTH ONCE DAILY 30 capsule 1  . lisinopril (PRINIVIL,ZESTRIL) 40 MG tablet TAKE ONE (1) TABLET BY MOUTH EVERY DAY 90 tablet 1  . omeprazole (PRILOSEC) 40 MG capsule TAKE ONE CAPSULE BY MOUTH TWICE A DAY 180 capsule 1  . simvastatin (ZOCOR)  20 MG tablet TAKE ONE (1) TABLET BY MOUTH EVERY DAY 90 tablet 1  . traZODone (DESYREL) 50 MG tablet Take 50 mg by mouth at bedtime.     No current facility-administered medications on file prior to visit.      Past Surgical History:  Procedure Laterality Date  . HAND SURGERY Right   . HERNIA REPAIR    . LYMPHADENECTOMY Bilateral 08/29/2013   Procedure: LYMPHADENECTOMY "BILATERAL PELVIC LYMPH NODE DISSECTION";  Surgeon: Molli Hazard, MD;  [facility];  Service: Urology;  Laterality: Bilateral;  . ORIF ANKLE FRACTURE Right 12/10/2012   Procedure: OPEN REDUCTION INTERNAL FIXATION (ORIF) ANKLE FRACTURE;  Surgeon: Hessie Dibble, MD;  Location[facility];  Service: Orthopedics;  Laterality: Right;  . ROBOT ASSISTED LAPAROSCOPIC RADICAL PROSTATECTOMY N/A 08/29/2013   Procedure: ROBOTIC ASSISTED LAPAROSCOPIC RADICAL PROSTATECTOMY LEVEL 2, Lysis of adhesions;  Surgeon: Molli Hazard, MD;  Location: [facility];  Service: Urology;  Laterality: N/A;    Past Medical History:  Diagnosis Date  . Alcohol abuse   . Alcoholic hepatitis   . Allergy   . Anxiety   . 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 Depression   . DVT (deep venous thrombosis) ([place])   . GERD (gastroesophageal reflux disease)   . Hypertension   . Ileus ([place]) 09/2013  . 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 Shortness of breath    new onset- occasionally-at rest and exertion  . Stroke ([place])   . Substance abuse   .  TIA (transient ischemic attack) 08/2013     Review of Systems  Constitutional: Negative for chills and fever.  Eyes:       Negative for changes in vision  Respiratory: Negative for cough, chest tightness and wheezing.   Cardiovascular: Negative for chest pain, palpitations and leg swelling.  Neurological: Positive for dizziness and headaches. Negative for weakness and light-headedness.      Objective:    BP (!) 170/98   Pulse 62   Temp 98.2 F (36.8 C) (Oral)   Resp 16   Ht '5\' 10"'$   (1.778 m)   Wt 163 lb (73.9 kg)   SpO2 94%   BMI 23.39 kg/m  Nursing note and vital signs reviewed.  Physical Exam  Constitutional: He is oriented to person, place, and time. He appears well-developed and well-nourished. No distress.  Eyes: Conjunctivae and EOM are normal. Pupils are equal, round, and reactive to light.  Cardiovascular: Normal rate, regular rhythm, normal heart sounds and intact distal pulses.   Pulmonary/Chest: Effort normal and breath sounds normal.  Neurological: He is alert and oriented to person, place, and time. No cranial nerve deficit.  Skin: Skin is warm and dry.  Psychiatric: He has a normal mood and affect. His behavior is normal. Judgment and thought content normal.       Assessment & Plan:   Problem List Items Addressed This Visit      Cardiovascular and Mediastinum   Hypertensive urgency    Blood pressure significantly elevated at 220/130 with headache that he took Goody's powder 4. Had taken medication 1 hour prior to arrival for office visit. Concern for hypertensive crisis. Patient was given an oral dose of 10 mg of nebivolol and monitored for 45 minute timeframe with blood pressure improving to 170/98. He noted improvements in his symptoms. Increase metoprolol to 100 mg twice daily and start amlodipine. Continue current dosage of lisinopril. Consider changing medication to Bystolic pending patient afford ability to monitor blood pressure at home. Follow-up in one week or sooner for blood pressure readings. Denies worse headache of life or additional symptoms of end organ damage. Advised to seek emergency care if blood pressure remains elevated or symptoms return prior to follow-up.      Relevant Medications   amLODipine (NORVASC) 10 MG tablet   metoprolol (LOPRESSOR) 50 MG tablet    Other Visit Diagnoses   None.      I have changed Tanner [name]'s metoprolol. I am also having him start on amLODipine. Additionally, I am having him maintain his  traZODone, clonazePAM, FLUoxetine, omeprazole, lisinopril, and simvastatin.   Meds ordered this encounter  Medications  . amLODipine (NORVASC) 10 MG tablet    Sig: Take 1 tablet (10 mg total) by mouth daily.    Dispense:  30 tablet    Refill:  0    Order Specific Question:   Supervising Provider    Answer:   Pricilla Holm A [[id number]]  . metoprolol (LOPRESSOR) 50 MG tablet    Sig: Take 2 tablets (100 mg total) by mouth 2 (two) times daily.    Dispense:  180 tablet    Refill:  1    Order Specific Question:   Supervising Provider    Answer:   Pricilla Holm A [[id number]]     Follow-up: Return in about 1 week (around 02/10/2016) for Nurse visit for BP check.  Mauricio Po, [name]

## 2016-02-03 NOTE — Assessment & Plan Note (Signed)
Blood pressure significantly elevated at 220/130 with headache that he took Goody's powder 4. Had taken medication 1 hour prior to arrival for office visit. Concern for hypertensive crisis. Patient was given an oral dose of 10 mg of nebivolol and monitored for 45 minute timeframe with blood pressure improving to 170/98. He noted improvements in his symptoms. Increase metoprolol to 100 mg twice daily and start amlodipine. Continue current dosage of lisinopril. Consider changing medication to Bystolic pending patient afford ability to monitor blood pressure at home. Follow-up in one week or sooner for blood pressure readings. Denies worse headache of life or additional symptoms of end organ damage. Advised to seek emergency care if blood pressure remains elevated or symptoms return prior to follow-up.

## 2016-02-10 ENCOUNTER — Ambulatory Visit: Payer: Medicare Other

## 2016-02-10 ENCOUNTER — Telehealth: Payer: Self-pay

## 2016-02-10 VITALS — BP 136/90

## 2016-02-10 DIAGNOSIS — Z0130 Encounter for examination of blood pressure without abnormal findings: Secondary | ICD-10-CM

## 2016-02-10 NOTE — Progress Notes (Signed)
Patient is in today for BP check. Patient states that he is taking the amlodipine, lisinopril and metoprolol.   Patient admits that he is not taking an ASA.   Took BP twice. Both readings had 90 diastolic.

## 2016-02-10 NOTE — Telephone Encounter (Signed)
Pt came in today for BP check.   Pt is not taking an ASA.

## 2016-02-16 NOTE — Telephone Encounter (Signed)
Patient states he has not checked on the price of bystolic.   Patient states he is still not taking this medication. Patient states he is only taking metoprolol '100mg'$  twice a day and lisinopril '40mg'$  once a day.   Please advise.

## 2016-02-16 NOTE — Telephone Encounter (Signed)
Notified patient and told to keep log of bp readings.  Also scheduled appointment for next week 8/17.

## 2016-02-16 NOTE — Telephone Encounter (Signed)
Please have him continue to monitor his blood pressure as this is the only way we are going to know if the medications are working. Please continue with the metoprolol, amlodipine and lisinopril. Follow up in 1 week.

## 2016-02-16 NOTE — Telephone Encounter (Signed)
Patient states he is also taking amlodipine 10 mg once a day

## 2016-02-16 NOTE — Telephone Encounter (Signed)
Patient states he was told he would get a call back in regard to this visit as to if he needs to be taking BP meds.  Please follow up in regard.

## 2016-02-16 NOTE — Telephone Encounter (Signed)
Patient states he feels like his BP is high in the morning b/c he has headaches and face feels flush and sometimes dizzy.  Patient states he has not taken BP in 3 days b/c he gets worried when he takes it.  But states he will start back taking it.

## 2016-02-16 NOTE — Telephone Encounter (Signed)
Patient states he is currently taking BP meds and it renews today but does not want to refill if he is going to be placed on something else.

## 2016-02-16 NOTE — Telephone Encounter (Signed)
Please have him refill as his last blood pressure was improved. Can he afford the Bystolic?

## 2016-02-24 ENCOUNTER — Ambulatory Visit (INDEPENDENT_AMBULATORY_CARE_PROVIDER_SITE_OTHER): Payer: Medicare Other | Admitting: Family

## 2016-02-24 ENCOUNTER — Encounter: Payer: Self-pay | Admitting: Family

## 2016-02-24 VITALS — BP 140/88 | HR 60 | Temp 98.0°F | Resp 16 | Ht 70.0 in | Wt 163.0 lb

## 2016-02-24 DIAGNOSIS — R51 Headache: Secondary | ICD-10-CM

## 2016-02-24 DIAGNOSIS — I10 Essential (primary) hypertension: Secondary | ICD-10-CM | POA: Diagnosis not present

## 2016-02-24 DIAGNOSIS — R519 Headache, unspecified: Secondary | ICD-10-CM

## 2016-02-24 DIAGNOSIS — F411 Generalized anxiety disorder: Secondary | ICD-10-CM

## 2016-02-24 MED ORDER — CLONAZEPAM 0.5 MG PO TABS: ORAL_TABLET | ORAL | 1 refills | Status: DC

## 2016-02-24 MED ORDER — METOPROLOL TARTRATE 100 MG PO TABS: 100.0000 mg | ORAL_TABLET | Freq: Two times a day (BID) | ORAL | 1 refills | Status: DC

## 2016-02-24 NOTE — Progress Notes (Signed)
Subjective:    Patient ID: Tanner [name], male    DOB: 04-Apr-1956, 60 y.o.   MRN: [id number]  Chief Complaint  Patient presents with  . Follow-up    Blood pressure follow up    HPI:  Tanner [name] is a 60 y.o. male who  has a past medical history of Alcohol abuse; Alcoholic hepatitis; Allergy; Anxiety; Arthritis; Depression; DVT (deep venous thrombosis) ([place]); GERD (gastroesophageal reflux disease); Hypertension; Ileus ([place]) (09/2013); Prostate cancer ([place]) (05/22/13); Shortness of breath; Stroke ([place]); Substance abuse; and TIA (transient ischemic attack) (08/2013). and presents today for a follow up office visit.  1.) Hypertension - currently prescribed metoprolol, lisinopril, and amlodipine. Reports taking the medication as prescribed and denies adverse side effects or hypotensive readings. Blood pressure at home has been significantly improved with medication regimen adjustments. Denies symptoms of end organ damage or worse headache of life. Does express mild headache when waking up in the morning. He notes the headache improves after a small amount of time. Notes that he does snore at night and does not feel well rested in the morning.   BP Readings from Last 3 Encounters:  02/24/16 140/88  02/10/16 136/90  02/03/16 (!) 170/98   2.) Anxiety - currently maintained on clonazepam and fluoxetine. Reports taking medication as prescribed and denies adverse side effects. Symptoms are generally well controlled with current regimen.   Allergies  Allergen Reactions  . Dilaudid [Hydromorphone Hcl] Nausea And Vomiting    Pre pt history  . Oxycodone Nausea And Vomiting  . Procaine Hcl Other (See Comments)    Patient states he is allergic to novicaine  . Zofran [Ondansetron] Nausea And Vomiting  . Sulfa Antibiotics Rash     Current Outpatient Prescriptions on File Prior to Visit  Medication Sig Dispense Refill  . amLODipine (NORVASC) 10 MG tablet Take 1 tablet (10 mg total) by mouth daily.  30 tablet 0  . FLUoxetine (PROZAC) 40 MG capsule TAKE 1 CAPSULE BY MOUTH ONCE DAILY 30 capsule 1  . lisinopril (PRINIVIL,ZESTRIL) 40 MG tablet TAKE ONE (1) TABLET BY MOUTH EVERY DAY 90 tablet 1  . omeprazole (PRILOSEC) 40 MG capsule TAKE ONE CAPSULE BY MOUTH TWICE A DAY 180 capsule 1  . simvastatin (ZOCOR) 20 MG tablet TAKE ONE (1) TABLET BY MOUTH EVERY DAY 90 tablet 1  . traZODone (DESYREL) 50 MG tablet Take 50 mg by mouth at bedtime.     No current facility-administered medications on file prior to visit.     Review of Systems  Constitutional: Negative for chills and fever.  Eyes:       Negative for changes in vision  Respiratory: Negative for cough, chest tightness and wheezing.   Cardiovascular: Negative for chest pain, palpitations and leg swelling.  Neurological: Negative for dizziness, weakness and light-headedness.      Objective:    BP 140/88 (BP Location: Left Arm, Patient Position: Sitting, Cuff Size: Normal)   Pulse 60   Temp 98 F (36.7 C) (Oral)   Resp 16   Ht '5\' 10"'$  (1.778 m)   Wt 163 lb (73.9 kg)   SpO2 97%   BMI 23.39 kg/m  Nursing note and vital signs reviewed.  Physical Exam  Constitutional: He is oriented to person, place, and time. He appears well-developed and well-nourished. No distress.  Cardiovascular: Normal rate, regular rhythm, normal heart sounds and intact distal pulses.   Pulmonary/Chest: Effort normal and breath sounds normal.  Neurological: He is alert and oriented to person, place,  and time.  Skin: Skin is warm and dry.  Psychiatric: He has a normal mood and affect. His behavior is normal. Judgment and thought content normal.       Assessment & Plan:   Problem List Items Addressed This Visit      Cardiovascular and Mediastinum   Essential hypertension, benign    Blood pressure significantly improved with current regimen and at goal 140/90. No adverse side effects although does report a mild cough on occasion. Denies worse headache of  life with no symptoms of end organ damage present currently. Continue current dosage of metoprolol, lisinopril, and amlodipine. To monitor blood pressure at home. Decrease sodium in diet.      Relevant Medications   metoprolol (LOPRESSOR) 100 MG tablet   Essential hypertension - Primary   Relevant Medications   metoprolol (LOPRESSOR) 100 MG tablet     Other   Generalized anxiety disorder    Generalized anxiety disorder well controlled with current regimen of fluoxetine and clonazepam with no adverse side effects. Continue current dosage of clonazepam and fluoxetine. [place] controlled substance database reviewed with no irregularities.      Relevant Medications   clonazePAM (KLONOPIN) 0.5 MG tablet   Headache    This is a new problem. Morning headache with concern for possible sleep apnea given lack of feeling rested and sleepiness during the daytime. Obtain home sleep study. Follow-up pending results.      Relevant Medications   metoprolol (LOPRESSOR) 100 MG tablet   clonazePAM (KLONOPIN) 0.5 MG tablet   Other Relevant Orders   Home sleep test    Other Visit Diagnoses   None.      I have changed Tanner [name]'s metoprolol. I am also having him maintain his traZODone, FLUoxetine, omeprazole, lisinopril, simvastatin, amLODipine, and clonazePAM.   Meds ordered this encounter  Medications  . metoprolol (LOPRESSOR) 100 MG tablet    Sig: Take 1 tablet (100 mg total) by mouth 2 (two) times daily.    Dispense:  180 tablet    Refill:  1    Please do not fill until patient requests a refill. Discontinue previous metoprolol refills/orders.    Order Specific Question:   Supervising Provider    Answer:   Pricilla Holm A [[id number]]  . clonazePAM (KLONOPIN) 0.5 MG tablet    Sig: TAKE 1 TABLET BY MOUTH 3 TIMES DAILY AS NEEDED FOR ANXIETY    Dispense:  90 tablet    Refill:  1    Order Specific Question:   Supervising Provider    Answer:   Pricilla Holm A [[id number]]      Follow-up: Return in about 3 months (around 05/26/2016), or if symptoms worsen or fail to improve.  Mauricio Po, [name]

## 2016-02-24 NOTE — Assessment & Plan Note (Addendum)
This is a new problem. Morning headache with concern for possible sleep apnea given lack of feeling rested and sleepiness during the daytime. Obtain home sleep study. Follow-up pending results.

## 2016-02-24 NOTE — Patient Instructions (Signed)
Thank you for choosing [facility].  Summary/Instructions:  Please continue to take your medications as prescribed.   Continue to monitor your blood pressure at home.   Decrease sodium in diet.   Your prescription(s) have been submitted to your pharmacy or been printed and provided for you. Please take as directed and contact our office if you believe you are having problem(s) with the medication(s) or have any questions.  If your symptoms worsen or fail to improve, please contact our office for further instruction, or in case of emergency go directly to the emergency room at the closest medical facility.

## 2016-02-24 NOTE — Assessment & Plan Note (Signed)
Generalized anxiety disorder well controlled with current regimen of fluoxetine and clonazepam with no adverse side effects. Continue current dosage of clonazepam and fluoxetine. [place] controlled substance database reviewed with no irregularities.

## 2016-02-24 NOTE — Assessment & Plan Note (Signed)
Blood pressure significantly improved with current regimen and at goal 140/90. No adverse side effects although does report a mild cough on occasion. Denies worse headache of life with no symptoms of end organ damage present currently. Continue current dosage of metoprolol, lisinopril, and amlodipine. To monitor blood pressure at home. Decrease sodium in diet.

## 2016-02-28 ENCOUNTER — Other Ambulatory Visit: Payer: Self-pay | Admitting: Family

## 2016-03-23 ENCOUNTER — Other Ambulatory Visit: Payer: Self-pay | Admitting: Family

## 2016-04-18 ENCOUNTER — Encounter (INDEPENDENT_AMBULATORY_CARE_PROVIDER_SITE_OTHER): Payer: Medicare Other | Admitting: Internal Medicine

## 2016-04-18 DIAGNOSIS — R0602 Shortness of breath: Secondary | ICD-10-CM

## 2016-04-18 LAB — PULMONARY FUNCTION TEST
DL/VA % PRED: 105 %
DL/VA: 4.84 ml/min/mmHg/L
DLCO COR % PRED: 92 %
DLCO cor: 29.13 ml/min/mmHg
DLCO unc % pred: 79 %
DLCO unc: 25.1 ml/min/mmHg
FEF 25-75 POST: 1.33 L/s
FEF 25-75 PRE: 0.85 L/s
FEF2575-%CHANGE-POST: 56 %
FEF2575-%PRED-POST: 43 %
FEF2575-%PRED-PRE: 28 %
FEV1-%Change-Post: 22 %
FEV1-%Pred-Post: 60 %
FEV1-%Pred-Pre: 49 %
FEV1-Post: 2.19 L
FEV1-Pre: 1.78 L
FEV1FVC-%Change-Post: 14 %
FEV1FVC-%PRED-PRE: 70 %
FEV6-%CHANGE-POST: 7 %
FEV6-%PRED-POST: 76 %
FEV6-%Pred-Pre: 70 %
FEV6-Post: 3.47 L
FEV6-Pre: 3.22 L
FEV6FVC-%CHANGE-POST: 0 %
FEV6FVC-%PRED-PRE: 101 %
FEV6FVC-%Pred-Post: 101 %
FVC-%CHANGE-POST: 7 %
FVC-%PRED-POST: 74 %
FVC-%Pred-Pre: 69 %
FVC-POST: 3.55 L
FVC-Pre: 3.32 L
POST FEV1/FVC RATIO: 62 %
PRE FEV1/FVC RATIO: 54 %
Post FEV6/FVC ratio: 98 %
Pre FEV6/FVC Ratio: 97 %
RV % pred: 161 %
RV: 3.49 L
TLC % pred: 98 %
TLC: 6.82 L

## 2016-05-15 ENCOUNTER — Telehealth: Payer: Self-pay

## 2016-05-15 DIAGNOSIS — F411 Generalized anxiety disorder: Secondary | ICD-10-CM

## 2016-05-15 MED ORDER — CLONAZEPAM 0.5 MG PO TABS: ORAL_TABLET | ORAL | 0 refills | Status: DC

## 2016-05-15 NOTE — Telephone Encounter (Signed)
Rx faxed

## 2016-05-15 NOTE — Telephone Encounter (Signed)
Last refill was 02/24/16 please advise

## 2016-05-15 NOTE — Telephone Encounter (Signed)
[facility] faxed rx rq for clonazepam.

## 2016-05-15 NOTE — Telephone Encounter (Signed)
Medication refilled

## 2016-05-16 ENCOUNTER — Ambulatory Visit: Payer: Medicare Other | Admitting: Family

## 2016-05-25 ENCOUNTER — Ambulatory Visit: Payer: Medicare Other | Admitting: Family

## 2016-06-16 ENCOUNTER — Other Ambulatory Visit: Payer: Self-pay | Admitting: Family

## 2016-06-16 DIAGNOSIS — F411 Generalized anxiety disorder: Secondary | ICD-10-CM

## 2016-06-19 NOTE — Telephone Encounter (Signed)
Last refill 05/15/16

## 2016-06-19 NOTE — Telephone Encounter (Signed)
Rx faxed

## 2016-06-20 ENCOUNTER — Other Ambulatory Visit: Payer: Self-pay | Admitting: Family

## 2016-07-11 ENCOUNTER — Other Ambulatory Visit: Payer: Self-pay | Admitting: Family

## 2016-07-11 DIAGNOSIS — F411 Generalized anxiety disorder: Secondary | ICD-10-CM

## 2016-07-11 NOTE — Telephone Encounter (Signed)
Last refill was 06/19/16

## 2016-07-12 NOTE — Telephone Encounter (Signed)
Faxed

## 2016-08-14 ENCOUNTER — Other Ambulatory Visit: Payer: Self-pay

## 2016-08-14 MED ORDER — AMLODIPINE BESYLATE 10 MG PO TABS: 10.0000 mg | ORAL_TABLET | Freq: Every day | ORAL | 0 refills | Status: DC

## 2016-08-14 MED ORDER — LISINOPRIL 40 MG PO TABS: ORAL_TABLET | ORAL | 0 refills | Status: DC

## 2016-08-16 ENCOUNTER — Encounter (HOSPITAL_COMMUNITY): Payer: Self-pay

## 2016-08-16 ENCOUNTER — Emergency Department (HOSPITAL_COMMUNITY): Payer: Medicare HMO

## 2016-08-16 ENCOUNTER — Other Ambulatory Visit: Payer: Self-pay | Admitting: *Deleted

## 2016-08-16 ENCOUNTER — Inpatient Hospital Stay (HOSPITAL_COMMUNITY)
Admission: EM | Admit: 2016-08-16 | Discharge: 2016-08-20 | DRG: 481 | Disposition: A | Discharge status: Home Health | Payer: Medicare HMO | Attending: Internal Medicine | Admitting: Internal Medicine

## 2016-08-16 DIAGNOSIS — W1830XA Fall on same level, unspecified, initial encounter: Secondary | ICD-10-CM | POA: Diagnosis present

## 2016-08-16 DIAGNOSIS — S72009A Fracture of unspecified part of neck of unspecified femur, initial encounter for closed fracture: Secondary | ICD-10-CM | POA: Diagnosis present

## 2016-08-16 DIAGNOSIS — S72091A Other fracture of head and neck of right femur, initial encounter for closed fracture: Principal | ICD-10-CM | POA: Diagnosis present

## 2016-08-16 DIAGNOSIS — R69 Illness, unspecified: Secondary | ICD-10-CM | POA: Diagnosis not present

## 2016-08-16 DIAGNOSIS — S72111A Displaced fracture of greater trochanter of right femur, initial encounter for closed fracture: Secondary | ICD-10-CM | POA: Diagnosis not present

## 2016-08-16 DIAGNOSIS — Z800 Family history of malignant neoplasm of digestive organs: Secondary | ICD-10-CM | POA: Diagnosis not present

## 2016-08-16 DIAGNOSIS — F10230 Alcohol dependence with withdrawal, uncomplicated: Secondary | ICD-10-CM | POA: Diagnosis not present

## 2016-08-16 DIAGNOSIS — F17200 Nicotine dependence, unspecified, uncomplicated: Secondary | ICD-10-CM | POA: Diagnosis present

## 2016-08-16 DIAGNOSIS — M4854XA Collapsed vertebra, not elsewhere classified, thoracic region, initial encounter for fracture: Secondary | ICD-10-CM | POA: Diagnosis present

## 2016-08-16 DIAGNOSIS — M8080XA Other osteoporosis with current pathological fracture, unspecified site, initial encounter for fracture: Secondary | ICD-10-CM

## 2016-08-16 DIAGNOSIS — E871 Hypo-osmolality and hyponatremia: Secondary | ICD-10-CM | POA: Diagnosis not present

## 2016-08-16 DIAGNOSIS — Z6825 Body mass index (BMI) 25.0-25.9, adult: Secondary | ICD-10-CM

## 2016-08-16 DIAGNOSIS — R262 Difficulty in walking, not elsewhere classified: Secondary | ICD-10-CM

## 2016-08-16 DIAGNOSIS — S299XXA Unspecified injury of thorax, initial encounter: Secondary | ICD-10-CM | POA: Diagnosis not present

## 2016-08-16 DIAGNOSIS — F10220 Alcohol dependence with intoxication, uncomplicated: Secondary | ICD-10-CM | POA: Diagnosis not present

## 2016-08-16 DIAGNOSIS — S72001A Fracture of unspecified part of neck of right femur, initial encounter for closed fracture: Secondary | ICD-10-CM

## 2016-08-16 DIAGNOSIS — S72041A Displaced fracture of base of neck of right femur, initial encounter for closed fracture: Secondary | ICD-10-CM | POA: Diagnosis not present

## 2016-08-16 DIAGNOSIS — Z8261 Family history of arthritis: Secondary | ICD-10-CM | POA: Diagnosis not present

## 2016-08-16 DIAGNOSIS — W19XXXA Unspecified fall, initial encounter: Secondary | ICD-10-CM

## 2016-08-16 DIAGNOSIS — Z9181 History of falling: Secondary | ICD-10-CM | POA: Diagnosis not present

## 2016-08-16 DIAGNOSIS — F17210 Nicotine dependence, cigarettes, uncomplicated: Secondary | ICD-10-CM | POA: Diagnosis present

## 2016-08-16 DIAGNOSIS — Z8673 Personal history of transient ischemic attack (TIA), and cerebral infarction without residual deficits: Secondary | ICD-10-CM | POA: Diagnosis not present

## 2016-08-16 DIAGNOSIS — E441 Mild protein-calorie malnutrition: Secondary | ICD-10-CM | POA: Diagnosis not present

## 2016-08-16 DIAGNOSIS — Z86718 Personal history of other venous thrombosis and embolism: Secondary | ICD-10-CM

## 2016-08-16 DIAGNOSIS — M25551 Pain in right hip: Secondary | ICD-10-CM | POA: Diagnosis not present

## 2016-08-16 DIAGNOSIS — F10239 Alcohol dependence with withdrawal, unspecified: Secondary | ICD-10-CM | POA: Diagnosis present

## 2016-08-16 DIAGNOSIS — Z419 Encounter for procedure for purposes other than remedying health state, unspecified: Secondary | ICD-10-CM

## 2016-08-16 DIAGNOSIS — I10 Essential (primary) hypertension: Secondary | ICD-10-CM | POA: Diagnosis present

## 2016-08-16 DIAGNOSIS — M810 Age-related osteoporosis without current pathological fracture: Secondary | ICD-10-CM | POA: Diagnosis present

## 2016-08-16 DIAGNOSIS — S22000A Wedge compression fracture of unspecified thoracic vertebra, initial encounter for closed fracture: Secondary | ICD-10-CM | POA: Diagnosis present

## 2016-08-16 DIAGNOSIS — M542 Cervicalgia: Secondary | ICD-10-CM | POA: Diagnosis not present

## 2016-08-16 DIAGNOSIS — N390 Urinary tract infection, site not specified: Secondary | ICD-10-CM | POA: Diagnosis not present

## 2016-08-16 DIAGNOSIS — S199XXA Unspecified injury of neck, initial encounter: Secondary | ICD-10-CM | POA: Diagnosis not present

## 2016-08-16 DIAGNOSIS — E46 Unspecified protein-calorie malnutrition: Secondary | ICD-10-CM | POA: Diagnosis not present

## 2016-08-16 DIAGNOSIS — S0990XA Unspecified injury of head, initial encounter: Secondary | ICD-10-CM | POA: Diagnosis not present

## 2016-08-16 DIAGNOSIS — Z801 Family history of malignant neoplasm of trachea, bronchus and lung: Secondary | ICD-10-CM | POA: Diagnosis not present

## 2016-08-16 DIAGNOSIS — Z8546 Personal history of malignant neoplasm of prostate: Secondary | ICD-10-CM | POA: Diagnosis not present

## 2016-08-16 DIAGNOSIS — R55 Syncope and collapse: Secondary | ICD-10-CM | POA: Diagnosis not present

## 2016-08-16 DIAGNOSIS — F1020 Alcohol dependence, uncomplicated: Secondary | ICD-10-CM | POA: Diagnosis present

## 2016-08-16 DIAGNOSIS — T148XXA Other injury of unspecified body region, initial encounter: Secondary | ICD-10-CM | POA: Diagnosis not present

## 2016-08-16 DIAGNOSIS — S72114A Nondisplaced fracture of greater trochanter of right femur, initial encounter for closed fracture: Secondary | ICD-10-CM | POA: Diagnosis not present

## 2016-08-16 DIAGNOSIS — S72051A Unspecified fracture of head of right femur, initial encounter for closed fracture: Secondary | ICD-10-CM | POA: Diagnosis not present

## 2016-08-16 DIAGNOSIS — R0602 Shortness of breath: Secondary | ICD-10-CM | POA: Diagnosis not present

## 2016-08-16 HISTORY — DX: Enterocolitis due to Clostridium difficile, not specified as recurrent: A04.72

## 2016-08-16 MED ORDER — METOPROLOL TARTRATE 100 MG PO TABS
100.0000 mg | ORAL_TABLET | Freq: Two times a day (BID) | ORAL | Status: DC
  Administered 2016-08-17 – 2016-08-20 (×6): 100 mg via ORAL
  Filled 2016-08-16 (×6): qty 1

## 2016-08-16 MED ORDER — FLUOXETINE HCL 20 MG PO CAPS
40.0000 mg | ORAL_CAPSULE | Freq: Every day | ORAL | Status: DC
  Administered 2016-08-18 – 2016-08-20 (×3): 40 mg via ORAL
  Filled 2016-08-16 (×3): qty 2

## 2016-08-16 MED ORDER — FOLIC ACID 1 MG PO TABS
1.0000 mg | ORAL_TABLET | Freq: Every day | ORAL | Status: DC
  Administered 2016-08-18 – 2016-08-20 (×3): 1 mg via ORAL
  Filled 2016-08-16 (×3): qty 1

## 2016-08-16 MED ORDER — MORPHINE SULFATE (PF) 2 MG/ML IV SOLN
0.5000 mg | INTRAVENOUS | Status: DC | PRN
  Administered 2016-08-17 (×2): 0.5 mg via INTRAVENOUS
  Filled 2016-08-16 (×2): qty 1

## 2016-08-16 MED ORDER — TRAZODONE HCL 50 MG PO TABS: 50.0000 mg | ORAL_TABLET | Freq: Every evening | ORAL | Status: DC | PRN

## 2016-08-16 MED ORDER — LORAZEPAM 1 MG PO TABS
1.0000 mg | ORAL_TABLET | Freq: Four times a day (QID) | ORAL | Status: AC | PRN
  Administered 2016-08-17: 1 mg via ORAL
  Filled 2016-08-16: qty 1

## 2016-08-16 MED ORDER — HYDROCODONE-ACETAMINOPHEN 5-325 MG PO TABS
1.0000 | ORAL_TABLET | Freq: Once | ORAL | Status: AC
  Administered 2016-08-16: 1 via ORAL
  Filled 2016-08-16: qty 1

## 2016-08-16 MED ORDER — THIAMINE HCL 100 MG/ML IJ SOLN: 100.0000 mg | Freq: Every day | INTRAMUSCULAR | Status: DC

## 2016-08-16 MED ORDER — LORAZEPAM 2 MG/ML IJ SOLN: 1.0000 mg | Freq: Four times a day (QID) | INTRAMUSCULAR | Status: AC | PRN

## 2016-08-16 MED ORDER — LISINOPRIL 40 MG PO TABS
40.0000 mg | ORAL_TABLET | Freq: Every day | ORAL | Status: DC
  Administered 2016-08-18 – 2016-08-20 (×3): 40 mg via ORAL
  Filled 2016-08-16 (×3): qty 1

## 2016-08-16 MED ORDER — AMLODIPINE BESYLATE 10 MG PO TABS
10.0000 mg | ORAL_TABLET | Freq: Every day | ORAL | 1 refills | Status: DC
Start: 2016-08-16 — End: 2017-05-17

## 2016-08-16 MED ORDER — AMLODIPINE BESYLATE 10 MG PO TABS
10.0000 mg | ORAL_TABLET | Freq: Every day | ORAL | Status: DC
  Administered 2016-08-18 – 2016-08-20 (×3): 10 mg via ORAL
  Filled 2016-08-16 (×3): qty 1

## 2016-08-16 MED ORDER — MORPHINE SULFATE (PF) 4 MG/ML IV SOLN
4.0000 mg | Freq: Once | INTRAVENOUS | Status: AC
  Administered 2016-08-16: 4 mg via INTRAVENOUS
  Filled 2016-08-16: qty 1

## 2016-08-16 MED ORDER — VITAMIN B-1 100 MG PO TABS
100.0000 mg | ORAL_TABLET | Freq: Every day | ORAL | Status: DC
  Administered 2016-08-18 – 2016-08-20 (×3): 100 mg via ORAL
  Filled 2016-08-16 (×3): qty 1

## 2016-08-16 MED ORDER — LISINOPRIL 40 MG PO TABS: ORAL_TABLET | ORAL | 1 refills | Status: DC

## 2016-08-16 MED ORDER — ADULT MULTIVITAMIN W/MINERALS CH
1.0000 | ORAL_TABLET | Freq: Every day | ORAL | Status: DC
  Administered 2016-08-19: 1 via ORAL
  Filled 2016-08-16 (×2): qty 1

## 2016-08-16 MED ORDER — HYDROCODONE-ACETAMINOPHEN 5-325 MG PO TABS: 1.0000 | ORAL_TABLET | Freq: Four times a day (QID) | ORAL | Status: DC | PRN

## 2016-08-16 MED ORDER — CLONAZEPAM 0.5 MG PO TABS: 0.5000 mg | ORAL_TABLET | Freq: Three times a day (TID) | ORAL | Status: DC | PRN

## 2016-08-16 MED ORDER — PANTOPRAZOLE SODIUM 40 MG PO TBEC
80.0000 mg | DELAYED_RELEASE_TABLET | Freq: Every day | ORAL | Status: DC
  Administered 2016-08-18 – 2016-08-20 (×3): 80 mg via ORAL
  Filled 2016-08-16 (×3): qty 2

## 2016-08-16 NOTE — ED Notes (Signed)
ED Provider at bedside. 

## 2016-08-16 NOTE — H&P (Signed)
History and Physical    Tanner [name] UKG:[id number] DOB: 1955/10/08 DOA: 08/16/2016  PCP: Mauricio Po, [name] Consultants:  Risa Grill - urology; [place] - orthopedics Patient coming from: home - lives with ex-wife; NOK: daughter, [phone]  Chief Complaint: hip fracture  HPI: Tanner [name] is a 61 y.o. male with medical history significant of alcoholism, prostate cancer, remote DVT presenting because "I got drunk and fell."  He was turning on the heater and stood up quickly and hit the floor. Landed on right hip.  Tried to stand up twice and realized it was probably broken.    Today he had 6-7 shots of vodka and 8-9 beers.  Drinks beer daily, 8-9/day.  Denies alcoholism.  Has never been to rehab.  Started at age 31.  Last day without drinking was years ago.  When hospitalized last time, he did have withdrawals.  3 DWIs, "I'm a slow learner, I'm sorry."  Last drink today at about 5pm.    Previously broke knee and right ankle from falls associated with alcohol.  Does not think he has been checked for osteoporosis.     ED Course: Per Dr. Laverta Baltimore: Patient with PMH of EtOH abuse presents to the ED with fall with questionable syncope, neck pain, and right hip pain. Mild tenderness with passive ROM. Patient is c-collar. No obvious sign of head trauma but the patient is intoxicated and so will be unable to clear head or c-spine clinically.  Patient with right hip fracture. Spoke with Dr. Rhona Raider with Orthopedics. He will evaluate for repair in the AM. Will admit to hospitalist.  Discussed patient's case with hospitalist, Dr. Lorin Mercy. She will place orders. Patient and family (if present) updated with plan. Care transferred to hospitalist service.  Review of Systems: As per HPI; otherwise 10 point review of systems reviewed and negative.   Ambulatory Status:  Ambulates without assistance  Past Medical History:  Diagnosis Date  . Alcohol abuse   . Alcoholic hepatitis   . Allergy   . Anxiety   .  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 C. difficile colitis   . Depression   . DVT (deep venous thrombosis) ([place]) 2015  . GERD (gastroesophageal reflux disease)   . Hypertension   . Ileus ([place]) 09/2013  . 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 Shortness of breath    new onset- occasionally-at rest and exertion  . Substance abuse   . TIA (transient ischemic attack) 08/2013    Past Surgical History:  Procedure Laterality Date  . HAND SURGERY Right   . HERNIA REPAIR    . LYMPHADENECTOMY Bilateral 08/29/2013   Procedure: LYMPHADENECTOMY "BILATERAL PELVIC LYMPH NODE DISSECTION";  Surgeon: Molli Hazard, MD;  Location: [facility];  Service: Urology;  Laterality: Bilateral;  . ORIF ANKLE FRACTURE Right 12/10/2012   Procedure: OPEN REDUCTION INTERNAL FIXATION (ORIF) ANKLE FRACTURE;  Surgeon: Hessie Dibble, MD;  Location: [facility];  Service: Orthopedics;  Laterality: Right;  . ROBOT ASSISTED LAPAROSCOPIC RADICAL PROSTATECTOMY N/A 08/29/2013   Procedure: ROBOTIC ASSISTED LAPAROSCOPIC RADICAL PROSTATECTOMY LEVEL 2, Lysis of adhesions;  Surgeon: Molli Hazard, MD;  Location: [facility];  Service: Urology;  Laterality: N/A;    Social History   Social History  . Marital status: Divorced    Spouse name: N/A  . Number of children: 1  . Years of education: 65   Occupational History  . disabled     Fingers, scolisosis,    Social  History Main Topics  . Smoking status: Current Every Day Smoker    Packs/day: 1.50    Years: 30.00  . Smokeless tobacco: Never Used  . Alcohol use 1.8 - 3.6 oz/week    3 - 6 Cans of beer per week  . Drug use: Yes    Frequency: 7.0 times per week    Types: Marijuana     Comment: marijuana-  last use today   cocaine 30 yrs ago  . Sexual activity: Not on file   Other Topics Concern  . Not on file   Social History Narrative   Denies    Allergies  Allergen Reactions  . Dilaudid [Hydromorphone Hcl] Nausea And Vomiting    Pre pt history  .  Oxycodone Nausea And Vomiting  . Procaine Hcl Other (See Comments)    Patient states he is allergic to novicaine  . Zofran [Ondansetron] Nausea And Vomiting  . Sulfa Antibiotics Rash    Family History  Problem Relation Age of Onset  . Lung cancer Father   . Arthritis Mother   . Pancreatic cancer Sister     Prior to Admission medications   Medication Sig Start Date End Date Taking? Authorizing Provider  amLODipine (NORVASC) 10 MG tablet Take 1 tablet (10 mg total) by mouth daily. 08/16/16   [name], [name]  clonazePAM (KLONOPIN) 0.5 MG tablet TAKE 1 TABLET BY MOUTH THREE TIMES DAILYAS NEEDED FOR ANXIETY 07/12/16   [name], [name]  FLUoxetine (PROZAC) 40 MG capsule TAKE 1 CAPSULE BY MOUTH DAILY 03/23/16   [name], [name]  lisinopril (PRINIVIL,ZESTRIL) 40 MG tablet TAKE ONE (1) TABLET BY MOUTH EVERY DAY 08/16/16   [name], [name]  metoprolol (LOPRESSOR) 100 MG tablet Take 1 tablet (100 mg total) by mouth 2 (two) times daily. 02/24/16   [name], [name]  omeprazole (PRILOSEC) 40 MG capsule TAKE ONE CAPSULE BY MOUTH TWICE A DAY 01/24/16   [name], [name]  simvastatin (ZOCOR) 20 MG tablet TAKE ONE (1) TABLET BY MOUTH EVERY DAY 01/25/16   [name], [name]  traZODone (DESYREL) 50 MG tablet Take 50 mg by mouth at bedtime.    Historical Provider, MD    Physical Exam: Vitals:   08/16/16 2145 08/16/16 2200 08/16/16 2320 08/16/16 2346  BP: 1'11/84 98/66 91/64 '$ 115/68  Pulse: 72 70 67 66  Resp:    18  Temp:    98.4 F (36.9 C)  TempSrc:    Oral  SpO2: 96% 95% 96% 100%  Weight:      Height:         General: Appears calm and comfortable and is NAD Eyes:  PERRL, EOMI, normal lids, iris ENT:  grossly normal hearing, lips & tongue, mmm Neck:  no LAD, masses or thyromegaly Cardiovascular:  RRR, no m/r/g. No LE edema.  Respiratory:  CTA bilaterally, no w/r/r. Normal respiratory effort. Abdomen:  soft, ntnd, NABS Skin:  no rash or induration seen on limited  exam Musculoskeletal:  Mild in-toeing of right leg with limited mobility, no obvious hip deformity Psychiatric:  grossly normal mood and affect, speech fluent and appropriate, AOx3 Neurologic:  CN 2-12 grossly intact, moves all extremities in coordinated fashion, sensation intact  Labs on Admission: I have personally reviewed following labs and imaging studies  CBC:  Recent Labs Lab 08/17/16 0001  WBC 7.5  HGB 12.6*  HCT 36.4*  MCV 97.1  PLT 540   Basic Metabolic Panel:  Recent Labs  Lab 08/17/16 0001  NA 129*  K 3.9  CL 94*  CO2 25  GLUCOSE 92  BUN 5*  CREATININE 0.91  CALCIUM 8.4*   GFR: Estimated Creatinine Clearance: 91.4 mL/min (by C-G formula based on SCr of 0.91 mg/dL). Liver Function Tests:  Recent Labs Lab 08/17/16 0001  AST 40  ALT 34  ALKPHOS 51  BILITOT 0.5  PROT 6.1*  ALBUMIN 3.1*   No results for input(s): LIPASE, AMYLASE in the last 168 hours. No results for input(s): AMMONIA in the last 168 hours. Coagulation Profile:  Recent Labs Lab 08/17/16 0001  INR 0.98   Cardiac Enzymes: No results for input(s): CKTOTAL, CKMB, CKMBINDEX, TROPONINI in the last 168 hours. BNP (last 3 results) No results for input(s): PROBNP in the last 8760 hours. HbA1C: No results for input(s): HGBA1C in the last 72 hours. CBG: No results for input(s): GLUCAP in the last 168 hours. Lipid Profile: No results for input(s): CHOL, HDL, LDLCALC, TRIG, CHOLHDL, LDLDIRECT in the last 72 hours. Thyroid Function Tests: No results for input(s): TSH, T4TOTAL, FREET4, T3FREE, THYROIDAB in the last 72 hours. Anemia Panel: No results for input(s): VITAMINB12, FOLATE, FERRITIN, TIBC, IRON, RETICCTPCT in the last 72 hours. Urine analysis:    Component Value Date/Time   COLORURINE YELLOW 07/22/2015 2121   APPEARANCEUR CLEAR 07/22/2015 2121   LABSPEC 1.007 07/22/2015 2121   PHURINE 5.5 07/22/2015 2121   GLUCOSEU NEGATIVE 07/22/2015 2121   HGBUR NEGATIVE 07/22/2015 2121    BILIRUBINUR NEGATIVE 07/22/2015 2121   KETONESUR NEGATIVE 07/22/2015 2121   PROTEINUR NEGATIVE 07/22/2015 2121   UROBILINOGEN 0.2 09/26/2013 1457   NITRITE NEGATIVE 07/22/2015 2121   LEUKOCYTESUR NEGATIVE 07/22/2015 2121    Creatinine Clearance: Estimated Creatinine Clearance: 91.4 mL/min (by C-G formula based on SCr of 0.91 mg/dL).  Sepsis Labs: '@LABRCNTIP'$ (procalcitonin:4,lacticidven:4) )No results found for this or any previous visit (from the past 240 hour(s)).   Radiological Exams on Admission: Dg Chest 1 View  Result Date: 08/16/2016 CLINICAL DATA:  Patient fell resulting in right hip fracture. Preop. EXAM: CHEST 1 VIEW COMPARISON:  07/23/2015 CT chest FINDINGS: Chronic T8 compression fracture with dextroconvex scoliosis of the lower thoracic spine is stable. Heart is normal in size. There is no mediastinal hematoma. Lungs are clear. IMPRESSION: Chronic T8 compression with dextroscoliosis of the lower thoracic spine. No acute cardiopulmonary abnormality. Electronically Signed   By: Ashley Royalty M.D.   On: 08/16/2016 20:10   Ct Head Wo Contrast  Result Date: 08/16/2016 CLINICAL DATA:  Neck pain after fall today EXAM: CT HEAD WITHOUT CONTRAST CT CERVICAL SPINE WITHOUT CONTRAST TECHNIQUE: Multidetector CT imaging of the head and cervical spine was performed following the standard protocol without intravenous contrast. Multiplanar CT image reconstructions of the cervical spine were also generated. COMPARISON:  02/18/2015, MRI 09/02/2013 FINDINGS: CT HEAD FINDINGS Brain: No acute territorial infarction or intracranial hemorrhage is visualized. No focal mass, mass effect or midline shift. Evidence of old lacunar infarct in the left posterior basal ganglia with extension to the periventricular white matter. The ventricles are stable in size. Minimal periventricular white matter hypodensity consistent with small vessel change. Mild to moderate atrophy. Vascular: No hyperdense vessels.  Carotid artery  calcifications. Skull: Normal. Negative for fracture or focal lesion. Sinuses/Orbits: Mucosal thickening in the ethmoid sinuses. No acute orbital abnormality. Other: None CT CERVICAL SPINE FINDINGS Alignment: No subluxation.  Facet alignment is maintained. Skull base and vertebrae: Craniovertebral junction appears intact. Vertebral body heights are normal. There is no fracture  identified. Soft tissues and spinal canal: No prevertebral fluid or swelling. No visible canal hematoma. Disc levels: Multilevel degenerative disc disease, moderate at C5-C6 and C6-C7. Multilevel hypertrophic facet arthropathy. Multiple levels of mild bilateral foraminal stenosis involving the mid cervical spine. Upper chest: Mild emphysematous disease at the apices. Nodular density in the apex of the right lung is suspected to be secondary to pulmonary scarring. Thyroid normal. Other: None IMPRESSION: 1. No definite CT evidence for acute intracranial abnormality. 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 2. No acute fracture or malalignment of the cervical spine. Electronically Signed   By: Donavan Foil M.D.   On: 08/16/2016 19:53   Ct Cervical Spine Wo Contrast  Result Date: 08/16/2016 CLINICAL DATA:  Neck pain after fall today EXAM: CT HEAD WITHOUT CONTRAST CT CERVICAL SPINE WITHOUT CONTRAST TECHNIQUE: Multidetector CT imaging of the head and cervical spine was performed following the standard protocol without intravenous contrast. Multiplanar CT image reconstructions of the cervical spine were also generated. COMPARISON:  02/18/2015, MRI 09/02/2013 FINDINGS: CT HEAD FINDINGS Brain: No acute territorial infarction or intracranial hemorrhage is visualized. No focal mass, mass effect or midline shift. Evidence of old lacunar infarct in the left posterior basal ganglia with extension to the periventricular white matter. The ventricles are stable in size. Minimal periventricular white matter hypodensity consistent with  small vessel change. Mild to moderate atrophy. Vascular: No hyperdense vessels.  Carotid artery calcifications. Skull: Normal. Negative for fracture or focal lesion. Sinuses/Orbits: Mucosal thickening in the ethmoid sinuses. No acute orbital abnormality. Other: None CT CERVICAL SPINE FINDINGS Alignment: No subluxation.  Facet alignment is maintained. Skull base and vertebrae: Craniovertebral junction appears intact. Vertebral body heights are normal. There is no fracture identified. Soft tissues and spinal canal: No prevertebral fluid or swelling. No visible canal hematoma. Disc levels: Multilevel degenerative disc disease, moderate at C5-C6 and C6-C7. Multilevel hypertrophic facet arthropathy. Multiple levels of mild bilateral foraminal stenosis involving the mid cervical spine. Upper chest: Mild emphysematous disease at the apices. Nodular density in the apex of the right lung is suspected to be secondary to pulmonary scarring. Thyroid normal. Other: None IMPRESSION: 1. No definite CT evidence for acute intracranial abnormality. 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 2. No acute fracture or malalignment of the cervical spine. Electronically Signed   By: Donavan Foil M.D.   On: 08/16/2016 19:53   Dg Hip Unilat W Or Wo Pelvis 2-3 Views Right  Result Date: 08/16/2016 CLINICAL DATA:  Patient fell.  Hip pain. EXAM: DG HIP (WITH OR WITHOUT PELVIS) 2-3V RIGHT COMPARISON:  None. FINDINGS: Acute, closed, varus angulated basicervical fracture of the proximal right femur. There is a sagittally oriented fracture as well at the base of the right greater trochanter without avulsion nor displacement. Femoral heads are seated within their hip joints with slight joint space narrowing bilaterally. The bony pelvis appears intact. IMPRESSION: Acute, closed, basicervical fracture of the right femur with varus angulation. Incomplete nondisplaced fracture at the base of the greater trochanter is also seen  right. Electronically Signed   By: Ashley Royalty M.D.   On: 08/16/2016 20:07    EKG: Not done, ordered  Assessment/Plan Principal Problem:   Hip fracture [facility]) Active Problems:   Hyponatremia   Protein-calorie malnutrition ([facility])   Thoracic compression fracture, closed, initial encounter ([place])   Alcohol dependence ([place])   Tobacco dependence   Osteoporosis   Hip fracture -Mechanical fall resulting in hip fracture -Orthopedics consult  in AM -NPO after midnight in anticipation of surgical repair tomorrow -SCDs overnight, start Lovenox post-operatively (or as per ortho) -CXR and EKG prior to surgery -Pain control with Vicodin/morphine as per order set -SW consult for rehab placement and substance abuse -Will need PT consult post-operatively -Hip fracture order set utilized  Alcohol dependence -Patient with chronic ETOH dependence -Has never been to rehab and currently does not appear to be interested in going -Cannot remember the last day he did not drink -Last drink was at approximately 5pm -h/o withdrawal in the past -He is at high risk for complications of withdrawal including seizures, DTs -CIWA protocol -SW consult for possible inpatient treatment -If he has severe withdrawal, he may require transfer to SDU/ICU for Precedex infusion  Hyponatremia -Na 129, 134 on 07/22/15 -May be related to ETOH dependence, volume deficiency -Has h/o prostate CA and so SIADH is also a consideration -For now will monitor without further evaluation/treatment  Malnutrition -Albumin 3.1, 3.9 on 05/24/15 -Nutrition consult as per hip fracture order set  Osteoporosis -Patient with multiple prior fractures from falls -Now with 2 different femur fractures resulting from current fall -Also with thoracic compression fracture -Based on these multiple falls, the patient likely already meets criteria for osteoporosis -He is quite young for this condition but it is likely influenced by his ETOH and  tobacco dependence -Needs outpatient f/u for testing and/or empiric treatment with bisphosphonate therapy  Tobacco dependence -Encourage cessation.  This was discussed with the patient and should be reviewed on an ongoing basis.  -Patch declined by patient.   DVT prophylaxis:  SCDs until approved for Lovenox by orthopedics Code Status:  Full - confirmed with patient/family Family Communication: Sister, ex-wife, and daughter all present throughout evaluation  Disposition Plan: Likely to need rehab, either inpatient or SNF Consults called: Orthopedics (to see in AM), SW; will need PT post-operatively Admission status: Admit - It is my clinical opinion that admission to INPATIENT is reasonable and necessary because this patient will require at least 2 midnights in the hospital to treat this condition based on the medical complexity of the problems presented.  Given the aforementioned information, the predictability of an adverse outcome is felt to be significant.    Karmen Bongo MD [facility]  If 7PM-7AM, please contact night-coverage [email or web address] Password [email or web address]  08/17/2016, 1:19 AM

## 2016-08-16 NOTE — ED Notes (Signed)
Pt reports his BP has been running lower at night for the past 2-3 weeks. Will page admitting to ensure they are aware.

## 2016-08-16 NOTE — ED Notes (Signed)
Pt and family made aware of bed assignment 

## 2016-08-16 NOTE — ED Notes (Signed)
Attempted report x 2 

## 2016-08-16 NOTE — ED Triage Notes (Addendum)
Pt presents via EMS with R hip pain and neck pain s/t to fall that occurred earlier today. Per EMS, pt with ETOH on board, pt reports he drank about 8 beers and 5-6 shots of vodka. Pt fell on carpeted floor on R side and reports he hit his head on the vacuum. Denies LOC or head pain. R hip pain with movement. No deformity noted. Pt with baseline R sided deficits due to previous stroke. Denies blood thinners. 130/90, HR 72, CBG 97, 98% on RA. A&O X4. Last drink about 1700 today.

## 2016-08-16 NOTE — ED Notes (Signed)
Patient transported to CT 

## 2016-08-16 NOTE — ED Provider Notes (Signed)
Emergency Department Provider Note   I have reviewed the triage vital signs and the nursing notes.   HISTORY  Chief Complaint Fall and Hip Pain   HPI Tanner [name] is a 61 y.o. male with PMH of EtOH abuse, GERD, CVA with residual right sided deficits, and depression resents to the emergency department for evaluation after fall. Patient states he been drinking this afternoon and bent over to pick something up when he lost his footing and fell to the ground. The patient believes he had a positive loss of consciousness. When he woke up he thinks he may have hit his head on the vacuum cleaner. He had some mild neck pain and some more severe right hip pain. Patient notes a baseline of mild right-sided weakness after previous stroke. His hip is sharp, worse with movement, nonradiating, with no alleviating factors. The patient reports drinking 8-9 beers per day. Denies preceding chest pain, SOB, or palpitations.    Past Medical History:  Diagnosis Date  . Alcohol abuse   . Alcoholic hepatitis   . Allergy   . Anxiety   . 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 C. difficile colitis   . Depression   . DVT (deep venous thrombosis) ([place]) 2015  . GERD (gastroesophageal reflux disease)   . Hypertension   . Ileus ([place]) 09/2013  . 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 Shortness of breath    new onset- occasionally-at rest and exertion  . Substance abuse   . TIA (transient ischemic attack) 08/2013    Patient Active Problem List   Diagnosis Date Noted  . Hip fracture ([place]) 08/16/2016  . Headache 02/24/2016  . Hypertensive urgency 02/03/2016  . Shortness of breath 07/28/2015  . Generalized anxiety disorder 07/28/2015  . Medicare annual wellness visit, subsequent 05/24/2015  . Acute DVT of right tibial vein ([place]) 02/27/2015  . Tibia fracture 02/20/2015  . Tibial fracture 02/18/2015  . Syncope 02/18/2015  . Essential hypertension 02/18/2015  . History of stroke 02/18/2015  .  Fracture of tibia, right, closed   . C. difficile colitis 09/11/2013  . UTI (urinary tract infection) 09/11/2013  . Essential hypertension, benign 09/11/2013  . Ileus ([place]) 09/10/2013  . Spastic hemiplegia affecting dominant side ([place]) 09/09/2013  . Ileus, postoperative ([place]) 09/09/2013  . Hypokalemia 09/09/2013  . Nonspecific (abnormal) findings on radiological and other examination of gastrointestinal tract 09/09/2013  . CVA (cerebral infarction) 09/05/2013  . 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 Malignant neoplasm of prostate ([place]) 08/29/2013  . Prostate cancer ([place]) 05/22/2013  . Hyponatremia 12/11/2012    Class: Chronic  . Ankle fracture 12/10/2012    Past Surgical History:  Procedure Laterality Date  . HAND SURGERY Right   . HERNIA REPAIR    . LYMPHADENECTOMY Bilateral 08/29/2013   Procedure: LYMPHADENECTOMY "BILATERAL PELVIC LYMPH NODE DISSECTION";  Surgeon: Molli Hazard, MD;  Location: [facility];  Service: Urology;  Laterality: Bilateral;  . ORIF ANKLE FRACTURE Right 12/10/2012   Procedure: OPEN REDUCTION INTERNAL FIXATION (ORIF) ANKLE FRACTURE;  Surgeon: Hessie Dibble, MD;  Location: [facility];  Service: Orthopedics;  Laterality: Right;  . ROBOT ASSISTED LAPAROSCOPIC RADICAL PROSTATECTOMY N/A 08/29/2013   Procedure: ROBOTIC ASSISTED LAPAROSCOPIC RADICAL PROSTATECTOMY LEVEL 2, Lysis of adhesions;  Surgeon: Molli Hazard, MD;  Location: [facility];  Service: Urology;  Laterality: N/A;    Current Outpatient Rx  . Order #: [id number] Class: Normal  . Order #: [id number] Class:  Print  . Order #: [id number] Class: Normal  . Order #: [id number] Class: Normal  . Order #: [id number] Class: Normal  . Order #: [id number] Class: Normal  . Order #: [id number] Class: Historical Med  . Order #: [id number] Class: Normal    Allergies Dilaudid [hydromorphone hcl]; Oxycodone; Procaine hcl; Zofran [ondansetron]; and Sulfa antibiotics  Family History  Problem Relation Age of Onset  .  Lung cancer Father   . Arthritis Mother   . Pancreatic cancer Sister     Social History Social History  Substance Use Topics  . Smoking status: Current Every Day Smoker    Packs/day: 1.50    Years: 30.00  . Smokeless tobacco: Never Used  . Alcohol use 1.8 - 3.6 oz/week    3 - 6 Cans of beer per week    Review of Systems  Constitutional: No fever/chills Eyes: No visual changes. ENT: No sore throat. Cardiovascular: Denies chest pain. Respiratory: Denies shortness of breath. Gastrointestinal: No abdominal pain.  No nausea, no vomiting.  No diarrhea.  No constipation. Genitourinary: Negative for dysuria. Musculoskeletal: Negative for back pain. Positive right hip and neck pain.  Skin: Negative for rash. Neurological: Negative for headaches, focal weakness or numbness.  10-point ROS otherwise negative.  ____________________________________________   PHYSICAL EXAM:  VITAL SIGNS: ED Triage Vitals  Enc Vitals Group     BP 08/16/16 1814 136/82     Pulse Rate 08/16/16 1814 72     Resp 08/16/16 1814 18     Temp 08/16/16 1814 98.8 F (37.1 C)     Temp Source 08/16/16 1814 Oral     SpO2 08/16/16 1814 98 %     Weight 08/16/16 1811 178 lb (80.7 kg)     Height 08/16/16 1811 '5\' 10"'$  (1.778 m)     Pain Score 08/16/16 1810 10   Constitutional: Alert and oriented. Well appearing and in no acute distress. Eyes: Conjunctivae are normal. Head: Atraumatic. Nose: No congestion/rhinnorhea. Mouth/Throat: Mucous membranes are moist.  Oropharynx non-erythematous. Neck: No stridor.   Cardiovascular: Normal rate, regular rhythm. Good peripheral circulation. Grossly normal heart sounds.   Respiratory: Normal respiratory effort.  No retractions. Lungs CTAB. Gastrointestinal: Soft and nontender. No distention.  Musculoskeletal: No lower extremity edema. Mild tenderness with passive ROM of the right hip. Mild right knee pain with movement. No gross deformities of extremities. Neurologic:   Normal speech and language. 4+/5 RUE and RLE weakness.   Skin:  Skin is warm, dry and intact. No rash noted. Psychiatric: Mood and affect are normal. Speech and behavior are normal.  ____________________________________________  RADIOLOGY  Dg Chest 1 View  Result Date: 08/16/2016 CLINICAL DATA:  Patient fell resulting in right hip fracture. Preop. EXAM: CHEST 1 VIEW COMPARISON:  07/23/2015 CT chest FINDINGS: Chronic T8 compression fracture with dextroconvex scoliosis of the lower thoracic spine is stable. Heart is normal in size. There is no mediastinal hematoma. Lungs are clear. IMPRESSION: Chronic T8 compression with dextroscoliosis of the lower thoracic spine. No acute cardiopulmonary abnormality. Electronically Signed   By: Ashley Royalty M.D.   On: 08/16/2016 20:10   Ct Head Wo Contrast  Result Date: 08/16/2016 CLINICAL DATA:  Neck pain after fall today EXAM: CT HEAD WITHOUT CONTRAST CT CERVICAL SPINE WITHOUT CONTRAST TECHNIQUE: Multidetector CT imaging of the head and cervical spine was performed following the standard protocol without intravenous contrast. Multiplanar CT image reconstructions of the cervical spine were also generated. COMPARISON:  02/18/2015, MRI 09/02/2013 FINDINGS: CT HEAD FINDINGS Brain: No acute territorial infarction or intracranial  hemorrhage is visualized. No focal mass, mass effect or midline shift. Evidence of old lacunar infarct in the left posterior basal ganglia with extension to the periventricular white matter. The ventricles are stable in size. Minimal periventricular white matter hypodensity consistent with small vessel change. Mild to moderate atrophy. Vascular: No hyperdense vessels.  Carotid artery calcifications. Skull: Normal. Negative for fracture or focal lesion. Sinuses/Orbits: Mucosal thickening in the ethmoid sinuses. No acute orbital abnormality. Other: None CT CERVICAL SPINE FINDINGS Alignment: No subluxation.  Facet alignment is maintained. Skull base and  vertebrae: Craniovertebral junction appears intact. Vertebral body heights are normal. There is no fracture identified. Soft tissues and spinal canal: No prevertebral fluid or swelling. No visible canal hematoma. Disc levels: Multilevel degenerative disc disease, moderate at C5-C6 and C6-C7. Multilevel hypertrophic facet arthropathy. Multiple levels of mild bilateral foraminal stenosis involving the mid cervical spine. Upper chest: Mild emphysematous disease at the apices. Nodular density in the apex of the right lung is suspected to be secondary to pulmonary scarring. Thyroid normal. Other: None IMPRESSION: 1. No definite CT evidence for acute intracranial abnormality. 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 2. No acute fracture or malalignment of the cervical spine. Electronically Signed   By: Donavan Foil M.D.   On: 08/16/2016 19:53   Ct Cervical Spine Wo Contrast  Result Date: 08/16/2016 CLINICAL DATA:  Neck pain after fall today EXAM: CT HEAD WITHOUT CONTRAST CT CERVICAL SPINE WITHOUT CONTRAST TECHNIQUE: Multidetector CT imaging of the head and cervical spine was performed following the standard protocol without intravenous contrast. Multiplanar CT image reconstructions of the cervical spine were also generated. COMPARISON:  02/18/2015, MRI 09/02/2013 FINDINGS: CT HEAD FINDINGS Brain: No acute territorial infarction or intracranial hemorrhage is visualized. No focal mass, mass effect or midline shift. Evidence of old lacunar infarct in the left posterior basal ganglia with extension to the periventricular white matter. The ventricles are stable in size. Minimal periventricular white matter hypodensity consistent with small vessel change. Mild to moderate atrophy. Vascular: No hyperdense vessels.  Carotid artery calcifications. Skull: Normal. Negative for fracture or focal lesion. Sinuses/Orbits: Mucosal thickening in the ethmoid sinuses. No acute orbital abnormality. Other: None CT  CERVICAL SPINE FINDINGS Alignment: No subluxation.  Facet alignment is maintained. Skull base and vertebrae: Craniovertebral junction appears intact. Vertebral body heights are normal. There is no fracture identified. Soft tissues and spinal canal: No prevertebral fluid or swelling. No visible canal hematoma. Disc levels: Multilevel degenerative disc disease, moderate at C5-C6 and C6-C7. Multilevel hypertrophic facet arthropathy. Multiple levels of mild bilateral foraminal stenosis involving the mid cervical spine. Upper chest: Mild emphysematous disease at the apices. Nodular density in the apex of the right lung is suspected to be secondary to pulmonary scarring. Thyroid normal. Other: None IMPRESSION: 1. No definite CT evidence for acute intracranial abnormality. 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 2. No acute fracture or malalignment of the cervical spine. Electronically Signed   By: Donavan Foil M.D.   On: 08/16/2016 19:53   Dg Hip Unilat W Or Wo Pelvis 2-3 Views Right  Result Date: 08/16/2016 CLINICAL DATA:  Patient fell.  Hip pain. EXAM: DG HIP (WITH OR WITHOUT PELVIS) 2-3V RIGHT COMPARISON:  None. FINDINGS: Acute, closed, varus angulated basicervical fracture of the proximal right femur. There is a sagittally oriented fracture as well at the base of the right greater trochanter without avulsion nor displacement. Femoral heads are seated within their hip joints with slight joint  space narrowing bilaterally. The bony pelvis appears intact. IMPRESSION: Acute, closed, basicervical fracture of the right femur with varus angulation. Incomplete nondisplaced fracture at the base of the greater trochanter is also seen right. Electronically Signed   By: Ashley Royalty M.D.   On: 08/16/2016 20:07    ____________________________________________   PROCEDURES  Procedure(s) performed:   Procedures  None ____________________________________________   INITIAL IMPRESSION / ASSESSMENT  AND PLAN / ED COURSE  Pertinent labs & imaging results that were available during my care of the patient were reviewed by me and considered in my medical decision making (see chart for details).  Patient with PMH of EtOH abuse presents to the ED with fall with questionable syncope, neck pain, and right hip pain. Mild tenderness with passive ROM. Patient is c-collar. No obvious sign of head trauma but the patient is intoxicated and so will be unable to clear head or c-spine clinically.   Patient with right hip fracture. Spoke with Dr. Rhona Raider with Orthopedics. He will evaluate for repair in the AM. Will admit to hospitalist.   Discussed patient's case with hospitalist, Dr. Lorin Mercy. She will place orders. Patient and family (if present) updated with plan. Care transferred to hospitalist service.  I reviewed all nursing notes, vitals, pertinent old records, EKGs, labs, imaging (as available). ____________________________________________  FINAL CLINICAL IMPRESSION(S) / ED DIAGNOSES  Final diagnoses:  Closed fracture of right hip, initial encounter ([place])  Fall, initial encounter     MEDICATIONS GIVEN DURING THIS VISIT:  Medications  HYDROcodone-acetaminophen (NORCO/VICODIN) 5-325 MG per tablet 1 tablet (1 tablet Oral Given 08/16/16 1846)  morphine 4 MG/ML injection 4 mg (4 mg Intravenous Given 08/16/16 2047)     NEW OUTPATIENT MEDICATIONS STARTED DURING THIS VISIT:  None   Note:  This document was prepared using Dragon voice recognition software and may include unintentional dictation errors.  Nanda Quinton, MD Emergency Medicine   Margette Fast, MD 08/16/16 [phone]

## 2016-08-16 NOTE — ED Notes (Signed)
Pt desat to 94% on RA after morphine administration, pt placed on 2L [place] and repositioned. O2 sats increased to 98%.

## 2016-08-16 NOTE — ED Notes (Signed)
Pt returned from CT and XR.

## 2016-08-16 NOTE — ED Notes (Signed)
Attempted report x 3. Will give bedside report.

## 2016-08-16 NOTE — ED Notes (Signed)
Attempted report x1. 

## 2016-08-17 ENCOUNTER — Inpatient Hospital Stay (HOSPITAL_COMMUNITY): Payer: Medicare HMO

## 2016-08-17 ENCOUNTER — Inpatient Hospital Stay (HOSPITAL_COMMUNITY): Payer: Medicare HMO | Admitting: Anesthesiology

## 2016-08-17 ENCOUNTER — Encounter (HOSPITAL_COMMUNITY): Payer: Self-pay | Admitting: *Deleted

## 2016-08-17 ENCOUNTER — Encounter (HOSPITAL_COMMUNITY)
Admission: EM | Disposition: A | Discharge status: Home Health | Payer: Self-pay | Source: Home / Self Care | Attending: Internal Medicine

## 2016-08-17 DIAGNOSIS — E46 Unspecified protein-calorie malnutrition: Secondary | ICD-10-CM | POA: Diagnosis present

## 2016-08-17 DIAGNOSIS — M810 Age-related osteoporosis without current pathological fracture: Secondary | ICD-10-CM | POA: Diagnosis present

## 2016-08-17 DIAGNOSIS — F17200 Nicotine dependence, unspecified, uncomplicated: Secondary | ICD-10-CM

## 2016-08-17 DIAGNOSIS — S72001A Fracture of unspecified part of neck of right femur, initial encounter for closed fracture: Secondary | ICD-10-CM

## 2016-08-17 DIAGNOSIS — E871 Hypo-osmolality and hyponatremia: Secondary | ICD-10-CM

## 2016-08-17 DIAGNOSIS — S22000A Wedge compression fracture of unspecified thoracic vertebra, initial encounter for closed fracture: Secondary | ICD-10-CM | POA: Diagnosis present

## 2016-08-17 DIAGNOSIS — F1020 Alcohol dependence, uncomplicated: Secondary | ICD-10-CM | POA: Diagnosis present

## 2016-08-17 DIAGNOSIS — F10220 Alcohol dependence with intoxication, uncomplicated: Secondary | ICD-10-CM

## 2016-08-17 HISTORY — PX: INTRAMEDULLARY (IM) NAIL INTERTROCHANTERIC: SHX5875

## 2016-08-17 LAB — CBC
HEMATOCRIT: 36.4 % — AB (ref 39.0–52.0)
Hemoglobin: 12.6 g/dL — ABNORMAL LOW (ref 13.0–17.0)
MCH: 33.6 pg (ref 26.0–34.0)
MCHC: 34.6 g/dL (ref 30.0–36.0)
MCV: 97.1 fL (ref 78.0–100.0)
PLATELETS: 200 10*3/uL (ref 150–400)
RBC: 3.75 MIL/uL — ABNORMAL LOW (ref 4.22–5.81)
RDW: 13.3 % (ref 11.5–15.5)
WBC: 7.5 10*3/uL (ref 4.0–10.5)

## 2016-08-17 LAB — PROTIME-INR
INR: 0.98
Prothrombin Time: 13 seconds (ref 11.4–15.2)

## 2016-08-17 LAB — SURGICAL PCR SCREEN
MRSA, PCR: NEGATIVE
STAPHYLOCOCCUS AUREUS: NEGATIVE

## 2016-08-17 LAB — COMPREHENSIVE METABOLIC PANEL
ALT: 34 U/L (ref 17–63)
ANION GAP: 10 (ref 5–15)
AST: 40 U/L (ref 15–41)
Albumin: 3.1 g/dL — ABNORMAL LOW (ref 3.5–5.0)
Alkaline Phosphatase: 51 U/L (ref 38–126)
BUN: 5 mg/dL — ABNORMAL LOW (ref 6–20)
CHLORIDE: 94 mmol/L — AB (ref 101–111)
CO2: 25 mmol/L (ref 22–32)
Calcium: 8.4 mg/dL — ABNORMAL LOW (ref 8.9–10.3)
Creatinine, Ser: 0.91 mg/dL (ref 0.61–1.24)
GFR calc non Af Amer: >60 mL/min (ref >60)
Glucose, Bld: 92 mg/dL (ref 65–99)
Potassium: 3.9 mmol/L (ref 3.5–5.1)
SODIUM: 129 mmol/L — AB (ref 135–145)
Total Bilirubin: 0.5 mg/dL (ref 0.3–1.2)
Total Protein: 6.1 g/dL — ABNORMAL LOW (ref 6.5–8.1)

## 2016-08-17 LAB — URINALYSIS, ROUTINE W REFLEX MICROSCOPIC
BILIRUBIN URINE: NEGATIVE
Glucose, UA: NEGATIVE mg/dL
HGB URINE DIPSTICK: NEGATIVE
KETONES UR: NEGATIVE mg/dL
Leukocytes, UA: NEGATIVE
Nitrite: NEGATIVE
PROTEIN: NEGATIVE mg/dL
Specific Gravity, Urine: 1.012 (ref 1.005–1.030)
pH: 5 (ref 5.0–8.0)

## 2016-08-17 LAB — ABO/RH: ABO/RH(D): A POS

## 2016-08-17 SURGERY — FIXATION, FRACTURE, INTERTROCHANTERIC, WITH INTRAMEDULLARY ROD
Anesthesia: General | Laterality: Right

## 2016-08-17 MED ORDER — MORPHINE SULFATE (PF) 2 MG/ML IV SOLN
0.5000 mg | INTRAVENOUS | Status: DC | PRN
  Administered 2016-08-17 – 2016-08-19 (×2): 0.5 mg via INTRAVENOUS
  Filled 2016-08-17 (×2): qty 1

## 2016-08-17 MED ORDER — MENTHOL 3 MG MT LOZG
1.0000 | LOZENGE | OROMUCOSAL | Status: DC | PRN
  Filled 2016-08-17: qty 9

## 2016-08-17 MED ORDER — METOCLOPRAMIDE HCL 5 MG/ML IJ SOLN: 5.0000 mg | Freq: Three times a day (TID) | INTRAMUSCULAR | Status: DC | PRN

## 2016-08-17 MED ORDER — LACTATED RINGERS IV SOLN
INTRAVENOUS | Status: DC
  Administered 2016-08-17: 10:00:00 via INTRAVENOUS

## 2016-08-17 MED ORDER — ROCURONIUM BROMIDE 100 MG/10ML IV SOLN
INTRAVENOUS | Status: DC | PRN
  Administered 2016-08-17: 50 mg via INTRAVENOUS

## 2016-08-17 MED ORDER — SUGAMMADEX SODIUM 200 MG/2ML IV SOLN
INTRAVENOUS | Status: DC | PRN
  Administered 2016-08-17: 200 mg via INTRAVENOUS

## 2016-08-17 MED ORDER — ONDANSETRON HCL 4 MG/2ML IJ SOLN: 4.0000 mg | Freq: Four times a day (QID) | INTRAMUSCULAR | Status: DC | PRN

## 2016-08-17 MED ORDER — 0.9 % SODIUM CHLORIDE (POUR BTL) OPTIME
TOPICAL | Status: DC | PRN
  Administered 2016-08-17: 1000 mL

## 2016-08-17 MED ORDER — PROMETHAZINE HCL 25 MG/ML IJ SOLN: 6.2500 mg | INTRAMUSCULAR | Status: DC | PRN

## 2016-08-17 MED ORDER — DEXAMETHASONE SODIUM PHOSPHATE 10 MG/ML IJ SOLN
INTRAMUSCULAR | Status: DC | PRN
  Administered 2016-08-17: 10 mg via INTRAVENOUS

## 2016-08-17 MED ORDER — DEXTROSE 5 % IV SOLN
INTRAVENOUS | Status: DC | PRN
  Administered 2016-08-17: 25 ug/min via INTRAVENOUS

## 2016-08-17 MED ORDER — PHENOL 1.4 % MT LIQD: 1.0000 | OROMUCOSAL | Status: DC | PRN

## 2016-08-17 MED ORDER — DEXAMETHASONE SODIUM PHOSPHATE 10 MG/ML IJ SOLN
INTRAMUSCULAR | Status: AC
  Filled 2016-08-17: qty 1

## 2016-08-17 MED ORDER — FENTANYL CITRATE (PF) 100 MCG/2ML IJ SOLN
INTRAMUSCULAR | Status: AC
  Filled 2016-08-17: qty 2

## 2016-08-17 MED ORDER — ACETAMINOPHEN 325 MG PO TABS
650.0000 mg | ORAL_TABLET | Freq: Four times a day (QID) | ORAL | Status: DC | PRN
  Administered 2016-08-17 – 2016-08-19 (×2): 650 mg via ORAL
  Filled 2016-08-17 (×2): qty 2

## 2016-08-17 MED ORDER — CHLORHEXIDINE GLUCONATE 4 % EX LIQD: 60.0000 mL | Freq: Once | CUTANEOUS | Status: DC

## 2016-08-17 MED ORDER — ALBUMIN HUMAN 5 % IV SOLN
INTRAVENOUS | Status: DC | PRN
  Administered 2016-08-17: 11:00:00 via INTRAVENOUS

## 2016-08-17 MED ORDER — ENSURE ENLIVE PO LIQD
237.0000 mL | Freq: Two times a day (BID) | ORAL | Status: DC
  Administered 2016-08-17 – 2016-08-20 (×6): 237 mL via ORAL

## 2016-08-17 MED ORDER — PROPOFOL 10 MG/ML IV BOLUS
INTRAVENOUS | Status: AC
  Filled 2016-08-17: qty 20

## 2016-08-17 MED ORDER — ACETAMINOPHEN 650 MG RE SUPP
650.0000 mg | Freq: Four times a day (QID) | RECTAL | Status: DC | PRN
Start: 2016-08-17 — End: 2016-08-20

## 2016-08-17 MED ORDER — PROPOFOL 10 MG/ML IV BOLUS
INTRAVENOUS | Status: DC | PRN
  Administered 2016-08-17: 200 mg via INTRAVENOUS

## 2016-08-17 MED ORDER — MEPERIDINE HCL 25 MG/ML IJ SOLN: 6.2500 mg | INTRAMUSCULAR | Status: DC | PRN

## 2016-08-17 MED ORDER — LIDOCAINE HCL (CARDIAC) 20 MG/ML IV SOLN
INTRAVENOUS | Status: DC | PRN
  Administered 2016-08-17: 60 mg via INTRAVENOUS
  Administered 2016-08-17: 40 mg via INTRAVENOUS

## 2016-08-17 MED ORDER — FENTANYL CITRATE (PF) 100 MCG/2ML IJ SOLN: 25.0000 ug | INTRAMUSCULAR | Status: DC | PRN

## 2016-08-17 MED ORDER — MIDAZOLAM HCL 5 MG/5ML IJ SOLN
INTRAMUSCULAR | Status: DC | PRN
  Administered 2016-08-17: 2 mg via INTRAVENOUS

## 2016-08-17 MED ORDER — MIDAZOLAM HCL 2 MG/2ML IJ SOLN
INTRAMUSCULAR | Status: AC
Start: 2016-08-17 — End: 2016-08-17
  Filled 2016-08-17: qty 2

## 2016-08-17 MED ORDER — FENTANYL CITRATE (PF) 100 MCG/2ML IJ SOLN
INTRAMUSCULAR | Status: DC | PRN
  Administered 2016-08-17 (×4): 50 ug via INTRAVENOUS

## 2016-08-17 MED ORDER — METOCLOPRAMIDE HCL 5 MG PO TABS
5.0000 mg | ORAL_TABLET | Freq: Three times a day (TID) | ORAL | Status: DC | PRN
  Administered 2016-08-17: 5 mg via ORAL
  Administered 2016-08-19 – 2016-08-20 (×2): 10 mg via ORAL
  Filled 2016-08-17: qty 1
  Filled 2016-08-17 (×2): qty 2

## 2016-08-17 MED ORDER — HYDROCODONE-ACETAMINOPHEN 5-325 MG PO TABS
1.0000 | ORAL_TABLET | Freq: Four times a day (QID) | ORAL | Status: DC | PRN
  Administered 2016-08-17 – 2016-08-20 (×12): 2 via ORAL
  Filled 2016-08-17 (×12): qty 2

## 2016-08-17 MED ORDER — CEFAZOLIN SODIUM-DEXTROSE 2-4 GM/100ML-% IV SOLN
2.0000 g | INTRAVENOUS | Status: AC
  Administered 2016-08-17: 2 g via INTRAVENOUS
  Filled 2016-08-17 (×2): qty 100

## 2016-08-17 MED ORDER — POVIDONE-IODINE 10 % EX SWAB: 2.0000 "application " | Freq: Once | CUTANEOUS | Status: DC

## 2016-08-17 MED ORDER — ONDANSETRON HCL 4 MG PO TABS: 4.0000 mg | ORAL_TABLET | Freq: Four times a day (QID) | ORAL | Status: DC | PRN

## 2016-08-17 MED ORDER — ENOXAPARIN SODIUM 40 MG/0.4ML ~~LOC~~ SOLN
40.0000 mg | SUBCUTANEOUS | Status: DC
  Administered 2016-08-18 – 2016-08-20 (×3): 40 mg via SUBCUTANEOUS
  Filled 2016-08-17 (×3): qty 0.4

## 2016-08-17 MED ORDER — CEFAZOLIN SODIUM-DEXTROSE 2-4 GM/100ML-% IV SOLN
2.0000 g | Freq: Four times a day (QID) | INTRAVENOUS | Status: AC
  Administered 2016-08-17 (×2): 2 g via INTRAVENOUS
  Filled 2016-08-17 (×4): qty 100

## 2016-08-17 MED ORDER — SUGAMMADEX SODIUM 200 MG/2ML IV SOLN
INTRAVENOUS | Status: AC
  Filled 2016-08-17: qty 2

## 2016-08-17 MED ORDER — CHLORDIAZEPOXIDE HCL 10 MG PO CAPS
10.0000 mg | ORAL_CAPSULE | Freq: Three times a day (TID) | ORAL | Status: DC
  Administered 2016-08-17 – 2016-08-18 (×5): 10 mg via ORAL
  Filled 2016-08-17 (×5): qty 2
  Filled 2016-08-17: qty 1

## 2016-08-17 SURGICAL SUPPLY — 41 items
BIT DRILL 4.3MMS DISTAL GRDTED (BIT) ×1 IMPLANT
BNDG COHESIVE 4X5 TAN STRL (GAUZE/BANDAGES/DRESSINGS) ×2 IMPLANT
BNDG GAUZE ELAST 4 BULKY (GAUZE/BANDAGES/DRESSINGS) ×2 IMPLANT
CHLORAPREP W/TINT 26ML (MISCELLANEOUS) ×2 IMPLANT
COVER PERINEAL POST (MISCELLANEOUS) ×2 IMPLANT
COVER SURGICAL LIGHT HANDLE (MISCELLANEOUS) ×2 IMPLANT
DRAPE INCISE IOBAN 66X45 STRL (DRAPES) ×2 IMPLANT
DRAPE STERI IOBAN 125X83 (DRAPES) ×2 IMPLANT
DRAPE SURG 17X23 STRL (DRAPES) IMPLANT
DRILL 4.3MMS DISTAL GRADUATED (BIT) ×2
DRSG AQUACEL AG ADV 3.5X 6 (GAUZE/BANDAGES/DRESSINGS) ×2 IMPLANT
DRSG MEPILEX BORDER 4X4 (GAUZE/BANDAGES/DRESSINGS) ×2 IMPLANT
DRSG PAD ABDOMINAL 8X10 ST (GAUZE/BANDAGES/DRESSINGS) ×2 IMPLANT
ELECT REM PT RETURN 9FT ADLT (ELECTROSURGICAL) ×2
ELECTRODE REM PT RTRN 9FT ADLT (ELECTROSURGICAL) ×1 IMPLANT
GLOVE BIO SURGEON STRL SZ7 (GLOVE) ×2 IMPLANT
GLOVE BIO SURGEON STRL SZ7.5 (GLOVE) ×2 IMPLANT
GLOVE BIOGEL PI IND STRL 8 (GLOVE) ×1 IMPLANT
GLOVE BIOGEL PI INDICATOR 8 (GLOVE) ×1
GOWN STRL REUS W/ TWL LRG LVL3 (GOWN DISPOSABLE) ×2 IMPLANT
GOWN STRL REUS W/TWL LRG LVL3 (GOWN DISPOSABLE) ×2
GUIDEPIN 3.2X17.5 THRD DISP (PIN) ×2 IMPLANT
GUIDEWIRE BALL NOSE 80CM (WIRE) ×2 IMPLANT
KIT ROOM TURNOVER OR (KITS) ×2 IMPLANT
LINER BOOT UNIVERSAL DISP (MISCELLANEOUS) ×2 IMPLANT
MANIFOLD NEPTUNE II (INSTRUMENTS) ×2 IMPLANT
NAIL HIP FRACTURE 11X380MM (Nail) ×2 IMPLANT
NS IRRIG 1000ML POUR BTL (IV SOLUTION) ×2 IMPLANT
PACK GENERAL/GYN (CUSTOM PROCEDURE TRAY) ×2 IMPLANT
PAD ARMBOARD 7.5X6 YLW CONV (MISCELLANEOUS) ×4 IMPLANT
SCREW BONE CORTICAL 5.0X38 (Screw) ×2 IMPLANT
SCREW LAG 10.5MMX105MM HFN (Screw) ×2 IMPLANT
SCREWDRIVER DIST 3.5 281001019 (MISCELLANEOUS) ×2 IMPLANT
STAPLER VISISTAT 35W (STAPLE) ×2 IMPLANT
SUT VIC AB 1 CT1 27 (SUTURE) ×1
SUT VIC AB 1 CT1 27XBRD ANBCTR (SUTURE) ×1 IMPLANT
SUT VIC AB 2-0 CT1 27 (SUTURE) ×1
SUT VIC AB 2-0 CT1 TAPERPNT 27 (SUTURE) ×1 IMPLANT
TOWEL OR 17X24 6PK STRL BLUE (TOWEL DISPOSABLE) ×2 IMPLANT
TOWEL OR 17X26 10 PK STRL BLUE (TOWEL DISPOSABLE) ×2 IMPLANT
WATER STERILE IRR 1000ML POUR (IV SOLUTION) IMPLANT

## 2016-08-17 NOTE — Progress Notes (Signed)
Patient arrived back to room from surgery at this time. Safety precautions and orders reviewed. ICS encourage. VSS. Pt denied pain and request to sleep at this time. Will continue to monitor.  Ave Filter, RN

## 2016-08-17 NOTE — Anesthesia Procedure Notes (Signed)
Procedure Name: Intubation Date/Time: 08/17/2016 10:07 AM Performed by: WALKER, LUZ E Pre-anesthesia Checklist: Patient identified, Emergency Drugs available, Suction available and Patient being monitored Patient Re-evaluated:Patient Re-evaluated prior to inductionOxygen Delivery Method: Circle system utilized Preoxygenation: Pre-oxygenation with 100% oxygen Intubation Type: IV induction Ventilation: Mask ventilation without difficulty Laryngoscope Size: Mac and 4 Grade View: Grade II Tube type: Oral Tube size: 7.5 mm Number of attempts: 1 Airway Equipment and Method: Stylet and LTA kit utilized Placement Confirmation: ETT inserted through vocal cords under direct vision,  positive ETCO2 and breath sounds checked- equal and bilateral Secured at: 22 cm Tube secured with: Tape Dental Injury: Teeth and Oropharynx as per pre-operative assessment        

## 2016-08-17 NOTE — Progress Notes (Signed)
PROGRESS NOTE        PATIENT DETAILS Name: Tanner [name] Age: 61 y.o. Sex: male Date of Birth: 1956/06/06 Admit Date: 08/16/2016 Admitting Physician Karmen Bongo, MD GYB:WLSLHT, Belenda Cruise, [name]  Brief Narrative: Patient is a 61 y.o. male with past medical history of chronic alcoholism, prostate cancer, prior CVA, prior history of DVT (anticoagulation discontinued February 2017), hypertension admitted with a right hip fracture following a mechanical fall. Orthopedic consulted, patient underwent operative repair on 2/8. See below for further details  Subjective: Seen earlier this morning-lying comfortably in bed. Awake and alert.  Assessment/Plan: Right hip fracture: Secondary to a mechanical fall. Underwent operative repair on 2/8. Postop care will defer to orthopedics-recommendations are for weightbearing as tolerated, and prophylactic Lovenox.  Hyponatremia: Likely secondary to be a beer potomania-supportive care, follow.  Alcohol withdrawal: Mildly tremulous this morning, but awake and alert. Continue Ativan per protocol. At risk for developing delirium tremens-monitor closely-last drink was on 2/7.  EtOH use: Attempted counseling, and claims that he is tried to quit in the past and has only gone around 3-4 months without using alcohol before he relapses. We will get social work evaluation prior to discharge.  History of DVT: On Lovenox prophylaxis per orthopedics-especially given his prior history of venous thromboembolism  Hypertension: Controlled, continue metoprolol, amlodipine, and lisinopril  Tobacco abuse: Counseled.  History of prostate cancer: Outpatient follow-up with PCP/primary oncologist and urologist  DVT Prophylaxis: Prophylactic Lovenox   Code Status: Full code  Family Communication: None at bedside  Disposition Plan: Remain inpatient-but will plan on Home health vs SNF on discharge-await PT eval  Antimicrobial  agents: Anti-infectives    Start     Dose/Rate Route Frequency Ordered Stop   08/17/16 1600  ceFAZolin (ANCEF) IVPB 2g/100 mL premix     2 g 200 mL/hr over 30 Minutes Intravenous Every 6 hours 08/17/16 1220 08/18/16 0359   08/17/16 0800  ceFAZolin (ANCEF) IVPB 2g/100 mL premix     2 g 200 mL/hr over 30 Minutes Intravenous To ShortStay [facility] 08/17/16 0741 08/17/16 1025      Procedures: INTRAMEDULLARY (IM) NAIL RIGHT HIP 2/8>>  CONSULTS:  orthopedic surgery  Time spent: 25- minutes-Greater than 50% of this time was spent in counseling, explanation of diagnosis, planning of further management, and coordination of care.  MEDICATIONS: Scheduled Meds: . amLODipine  10 mg Oral Daily  .  ceFAZolin (ANCEF) IV  2 g Intravenous Q6H  . [START ON 08/18/2016] enoxaparin (LOVENOX) injection  40 mg Subcutaneous Q24H  . FLUoxetine  40 mg Oral Daily  . folic acid  1 mg Oral Daily  . lisinopril  40 mg Oral Daily  . metoprolol  100 mg Oral BID  . multivitamin with minerals  1 tablet Oral Daily  . pantoprazole  80 mg Oral Daily  . thiamine  100 mg Oral Daily   Or  . thiamine  100 mg Intravenous Daily   Continuous Infusions: . lactated ringers     PRN Meds:.acetaminophen **OR** acetaminophen, clonazePAM, HYDROcodone-acetaminophen, LORazepam **OR** LORazepam, menthol-cetylpyridinium **OR** phenol, metoCLOPramide **OR** metoCLOPramide (REGLAN) injection, morphine injection, traZODone   PHYSICAL EXAM: Vital signs: Vitals:   08/17/16 1145 08/17/16 1155 08/17/16 1210 08/17/16 1305  BP:  124/68 121/76 (!) 141/84  Pulse: 74 69 68 82  Resp: '14 14 14 16  '$ Temp:   98.1 F (36.7 C) 98 F (  36.7 C)  TempSrc:    Oral  SpO2: 92% 99% 98% 98%  Weight:      Height:       Filed Weights   08/16/16 1811  Weight: 80.7 kg (178 lb)   Body mass index is 25.54 kg/m.   General appearance :Awake, alert, not in any distress.  Eyes:, pupils equally reactive to light and accomodation,no scleral  icterus. HEENT: Atraumatic and Normocephalic Neck: supple, no JVD. No cervical lymphadenopathy.  Resp:Good air entry bilaterally, no added sounds  CVS: S1 S2 regular, no murmurs.  GI: Bowel sounds present, Non tender and not distended with no gaurding, rigidity or rebound.No organomegaly Extremities: B/L Lower Ext shows no edema, both legs are warm to touch Neurology:  speech clear,Non focal, sensation is grossly intact.Minimally tremulous Psychiatric: Normal judgment and insight. Alert and oriented x 3.  Musculoskeletal:No digital cyanosis Skin:No Rash, warm and dry Wounds:N/A  I have personally reviewed following labs and imaging studies  LABORATORY DATA: CBC:  Recent Labs Lab 08/17/16 0001  WBC 7.5  HGB 12.6*  HCT 36.4*  MCV 97.1  PLT 893    Basic Metabolic Panel:  Recent Labs Lab 08/17/16 0001  NA 129*  K 3.9  CL 94*  CO2 25  GLUCOSE 92  BUN 5*  CREATININE 0.91  CALCIUM 8.4*    GFR: Estimated Creatinine Clearance: 91.4 mL/min (by C-G formula based on SCr of 0.91 mg/dL).  Liver Function Tests:  Recent Labs Lab 08/17/16 0001  AST 40  ALT 34  ALKPHOS 51  BILITOT 0.5  PROT 6.1*  ALBUMIN 3.1*   No results for input(s): LIPASE, AMYLASE in the last 168 hours. No results for input(s): AMMONIA in the last 168 hours.  Coagulation Profile:  Recent Labs Lab 08/17/16 0001  INR 0.98    Cardiac Enzymes: No results for input(s): CKTOTAL, CKMB, CKMBINDEX, TROPONINI in the last 168 hours.  BNP (last 3 results) No results for input(s): PROBNP in the last 8760 hours.  HbA1C: No results for input(s): HGBA1C in the last 72 hours.  CBG: No results for input(s): GLUCAP in the last 168 hours.  Lipid Profile: No results for input(s): CHOL, HDL, LDLCALC, TRIG, CHOLHDL, LDLDIRECT in the last 72 hours.  Thyroid Function Tests: No results for input(s): TSH, T4TOTAL, FREET4, T3FREE, THYROIDAB in the last 72 hours.  Anemia Panel: No results for input(s):  VITAMINB12, FOLATE, FERRITIN, TIBC, IRON, RETICCTPCT in the last 72 hours.  Urine analysis:    Component Value Date/Time   COLORURINE YELLOW 08/17/2016 0248   APPEARANCEUR CLEAR 08/17/2016 0248   LABSPEC 1.012 08/17/2016 0248   PHURINE 5.0 08/17/2016 0248   GLUCOSEU NEGATIVE 08/17/2016 0248   HGBUR NEGATIVE 08/17/2016 0248   BILIRUBINUR NEGATIVE 08/17/2016 0248   KETONESUR NEGATIVE 08/17/2016 0248   PROTEINUR NEGATIVE 08/17/2016 0248   UROBILINOGEN 0.2 09/26/2013 1457   NITRITE NEGATIVE 08/17/2016 0248   LEUKOCYTESUR NEGATIVE 08/17/2016 0248    Sepsis Labs: Lactic Acid, Venous    Component Value Date/Time   LATICACIDVEN 0.9 09/06/2013 2251    MICROBIOLOGY: Recent Results (from the past 240 hour(s))  Surgical pcr screen     Status: None   Collection Time: 08/17/16  2:31 AM  Result Value Ref Range Status   MRSA, PCR NEGATIVE NEGATIVE Final   Staphylococcus aureus NEGATIVE NEGATIVE Final    Comment:        The Xpert SA Assay (FDA approved for NASAL specimens in patients over 68 years of age), is one component  of a comprehensive surveillance program.  Test performance has been validated by [facility] for patients greater than or equal to 26 year old. It is not intended to diagnose infection nor to guide or monitor treatment.     RADIOLOGY STUDIES/RESULTS: Dg Chest 1 View  Result Date: 08/16/2016 CLINICAL DATA:  Patient fell resulting in right hip fracture. Preop. EXAM: CHEST 1 VIEW COMPARISON:  07/23/2015 CT chest FINDINGS: Chronic T8 compression fracture with dextroconvex scoliosis of the lower thoracic spine is stable. Heart is normal in size. There is no mediastinal hematoma. Lungs are clear. IMPRESSION: Chronic T8 compression with dextroscoliosis of the lower thoracic spine. No acute cardiopulmonary abnormality. Electronically Signed   By: Ashley Royalty M.D.   On: 08/16/2016 20:10   Ct Head Wo Contrast  Result Date: 08/16/2016 CLINICAL DATA:  Neck pain after fall  today EXAM: CT HEAD WITHOUT CONTRAST CT CERVICAL SPINE WITHOUT CONTRAST TECHNIQUE: Multidetector CT imaging of the head and cervical spine was performed following the standard protocol without intravenous contrast. Multiplanar CT image reconstructions of the cervical spine were also generated. COMPARISON:  02/18/2015, MRI 09/02/2013 FINDINGS: CT HEAD FINDINGS Brain: No acute territorial infarction or intracranial hemorrhage is visualized. No focal mass, mass effect or midline shift. Evidence of old lacunar infarct in the left posterior basal ganglia with extension to the periventricular white matter. The ventricles are stable in size. Minimal periventricular white matter hypodensity consistent with small vessel change. Mild to moderate atrophy. Vascular: No hyperdense vessels.  Carotid artery calcifications. Skull: Normal. Negative for fracture or focal lesion. Sinuses/Orbits: Mucosal thickening in the ethmoid sinuses. No acute orbital abnormality. Other: None CT CERVICAL SPINE FINDINGS Alignment: No subluxation.  Facet alignment is maintained. Skull base and vertebrae: Craniovertebral junction appears intact. Vertebral body heights are normal. There is no fracture identified. Soft tissues and spinal canal: No prevertebral fluid or swelling. No visible canal hematoma. Disc levels: Multilevel degenerative disc disease, moderate at C5-C6 and C6-C7. Multilevel hypertrophic facet arthropathy. Multiple levels of mild bilateral foraminal stenosis involving the mid cervical spine. Upper chest: Mild emphysematous disease at the apices. Nodular density in the apex of the right lung is suspected to be secondary to pulmonary scarring. Thyroid normal. Other: None IMPRESSION: 1. No definite CT evidence for acute intracranial abnormality. 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 2. No acute fracture or malalignment of the cervical spine. Electronically Signed   By: Donavan Foil M.D.   On: 08/16/2016 19:53    Ct Cervical Spine Wo Contrast  Result Date: 08/16/2016 CLINICAL DATA:  Neck pain after fall today EXAM: CT HEAD WITHOUT CONTRAST CT CERVICAL SPINE WITHOUT CONTRAST TECHNIQUE: Multidetector CT imaging of the head and cervical spine was performed following the standard protocol without intravenous contrast. Multiplanar CT image reconstructions of the cervical spine were also generated. COMPARISON:  02/18/2015, MRI 09/02/2013 FINDINGS: CT HEAD FINDINGS Brain: No acute territorial infarction or intracranial hemorrhage is visualized. No focal mass, mass effect or midline shift. Evidence of old lacunar infarct in the left posterior basal ganglia with extension to the periventricular white matter. The ventricles are stable in size. Minimal periventricular white matter hypodensity consistent with small vessel change. Mild to moderate atrophy. Vascular: No hyperdense vessels.  Carotid artery calcifications. Skull: Normal. Negative for fracture or focal lesion. Sinuses/Orbits: Mucosal thickening in the ethmoid sinuses. No acute orbital abnormality. Other: None CT CERVICAL SPINE FINDINGS Alignment: No subluxation.  Facet alignment is maintained. Skull base and vertebrae: Craniovertebral junction appears intact. Vertebral  body heights are normal. There is no fracture identified. Soft tissues and spinal canal: No prevertebral fluid or swelling. No visible canal hematoma. Disc levels: Multilevel degenerative disc disease, moderate at C5-C6 and C6-C7. Multilevel hypertrophic facet arthropathy. Multiple levels of mild bilateral foraminal stenosis involving the mid cervical spine. Upper chest: Mild emphysematous disease at the apices. Nodular density in the apex of the right lung is suspected to be secondary to pulmonary scarring. Thyroid normal. Other: None IMPRESSION: 1. No definite CT evidence for acute intracranial abnormality. 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 2. No acute fracture or  malalignment of the cervical spine. Electronically Signed   By: Donavan Foil M.D.   On: 08/16/2016 19:53   Dg C-arm 1-60 Min  Result Date: 08/17/2016 CLINICAL DATA:  I am nail placement in the right femur EXAM: DG C-ARM 61-120 MIN; RIGHT FEMUR 2 VIEWS COMPARISON:  None. FLUOROSCOPY TIME:  1 minutes 1 second FINDINGS: Interval ORIF of a right basicervical proximal femoral fracture with an intramedullary nail and interlocking cannulated femoral neck screw without failure or complication. Anatomic alignment. IMPRESSION: Interval ORIF of a right basicervical proximal femoral fracture. Electronically Signed   By: Kathreen Devoid   On: 08/17/2016 11:21   Dg Hip Unilat W Or Wo Pelvis 2-3 Views Right  Result Date: 08/16/2016 CLINICAL DATA:  Patient fell.  Hip pain. EXAM: DG HIP (WITH OR WITHOUT PELVIS) 2-3V RIGHT COMPARISON:  None. FINDINGS: Acute, closed, varus angulated basicervical fracture of the proximal right femur. There is a sagittally oriented fracture as well at the base of the right greater trochanter without avulsion nor displacement. Femoral heads are seated within their hip joints with slight joint space narrowing bilaterally. The bony pelvis appears intact. IMPRESSION: Acute, closed, basicervical fracture of the right femur with varus angulation. Incomplete nondisplaced fracture at the base of the greater trochanter is also seen right. Electronically Signed   By: Ashley Royalty M.D.   On: 08/16/2016 20:07   Dg Femur, Min 2 Views Right  Result Date: 08/17/2016 CLINICAL DATA:  I am nail placement in the right femur EXAM: DG C-ARM 61-120 MIN; RIGHT FEMUR 2 VIEWS COMPARISON:  None. FLUOROSCOPY TIME:  1 minutes 1 second FINDINGS: Interval ORIF of a right basicervical proximal femoral fracture with an intramedullary nail and interlocking cannulated femoral neck screw without failure or complication. Anatomic alignment. IMPRESSION: Interval ORIF of a right basicervical proximal femoral fracture. Electronically  Signed   By: Kathreen Devoid   On: 08/17/2016 11:21     LOS: 1 day   Oren Binet, MD  [facility] Pager:[phone]  If 7PM-7AM, please contact night-coverage [email or web address] Password [email or web address] 08/17/2016, 1:29 PM

## 2016-08-17 NOTE — Op Note (Signed)
Procedure(s): INTRAMEDULLARY (IM) NAIL RIGHT HIP Procedure Note  Tanner [name] male 61 y.o. 08/17/2016  Procedure(s) and Anesthesia Type:    * INTRAMEDULLARY (IM) NAIL RIGHT HIP - General  Surgeon(s) and Role:    * Tania Ade, MD - Primary   Indications:  61 y.o. male s/p fall with right hip fracture. Indicated for surgery to promote early ambulation, pain control and prevent complications of bed rest.     Surgeon: Nita Sells   Assistants: Jeanmarie Hubert PA-C (Danielle was present and scrubbed throughout the procedure and was essential in positioning, retraction, exposure, and closure)  Anesthesia: General endotracheal anesthesia     Procedure Detail  INTRAMEDULLARY (IM) NAIL RIGHT HIP  Findings: Biomet affixes nail 11 x 380. One distal interlocking screw. Anatomic reduction.  Estimated Blood Loss:  300 mL         Drains: none  Blood Given: none          Specimens: none        Complications:  * No complications entered in OR log *         Disposition: PACU - hemodynamically stable.         Condition: stable    Procedure:  The patient was identified in the preoperative holding area  where I personally marked the operative site after verifying site, side,  and procedure with the patient. She was taken back to the operating  room where general anesthesia was induced without complication. She was  placed on the fracture table with the right lower extremity in traction,  and opposite lower extremity in a flexed abducted position. The arms were well  padded. Fluoroscopic imaging was used to verify reduction with gentle traction  and internal rotation. The right hip was then prepped and draped in the standard sterile fashion. An approximately 3 cm incision was made proximal  to the palpable greater trochanter tip. Dissection was carried down to  the tip and the short guidewire was placed under fluoroscopic imaging.  The proximal entry reamer was  used to open the canal and the ball-tipped  guidewire was then placed and advanced down the femoral canal. This was  used to obtain a measurement for the implant and then was subsequently  over reamed up to 12.5 mm by 0.5 mm increments. The 11x 380 mm nail was then  advanced over the guidewire without difficulty and the proximal jig was  then placed and a small 1.5 cm incision was made on the lateral thigh to  advance the lag screw guide against the lateral aspect of the femur.  The guide pin was advanced and its position was verified in AP and  lateral planes to be centered in the head.  The guidewire was over  reamed and the appropriate size lag screw was advanced. The  proximal set screw was then advanced, backed off a quarter turn to allow  sliding. AP and lateral imaging demonstrated appropriate position of  the screw and reduction of the fracture. The proximal jig was then  removed. Attention was turned to the knee. Where using perfect  circles technique on the lateral x-ray, one distal interlocking screw  was placed from lateral to medial through a percutaneous incision.  Final fluoroscopic imaging in AP and lateral planes at the hip and the  knee demonstrated near anatomic reduction with appropriate length and  position of the hardware. All wounds were then copiously irrigated with  normal saline and subsequently closed in layers with #1 Vicryl in  a deep  fascia layer, 2-0 Vicryl in a deep dermal layer, and staples for skin  closure. Sterile dressings were then applied including 4x4s and Mepilex  dressings. The patient was then taken off the fracture table,  transferred to the stretcher, and taken to the recovery room in stable  condition after she was extubated.   POSTOPERATIVE PLAN: He will be weightbearing as tolerated on the operative extremity. He will have DVT prophylaxis of Lovenox given his history of prior DVT.

## 2016-08-17 NOTE — Progress Notes (Signed)
Initial Nutrition Assessment  DOCUMENTATION CODES:   Not applicable  INTERVENTION:  Provide Ensure Enlive po BID, each supplement provides 350 kcal and 20 grams of protein.  Monitor magnesium, potassium, and phosphorus daily for at least 3 days, MD to replete as needed, as pt is at risk for refeeding syndrome given chronic alcoholism.  RD to continue to monitor.   NUTRITION DIAGNOSIS:   Increased nutrient needs related to  (s/p surgery) as evidenced by estimated needs.  GOAL:   Patient will meet greater than or equal to 90% of their needs  MONITOR:   PO intake, Supplement acceptance, Labs, Weight trends, Skin, I & O's  REASON FOR ASSESSMENT:   Consult Hip fracture protocol  ASSESSMENT:   61 y.o. male with medical history significant of alcoholism, prostate cancer, remote DVT presenting after fall. Patient with right hip fracture. Drinks beer daily, 8-9/day.   Procedure (2/8): INTRAMEDULLARY (IM) NAIL RIGHT HIP   Pt in OR during attempted time of visit. RD unable to obtain most recent nutrition history. Per MD note, pt reports last day without drinking was years ago. Pt at risk for refeeding syndrome once diet is advanced. Recommend monitoring magnesium, potassium, and phosphorus daily for at least 3 days, MD to replete as needed. Pt with no weight loss per weight records. RD to order nutritional supplements to aid in caloric and protein needs as well as in healing.   Unable to complete Nutrition-Focused physical exam at this time. RD to perform physical exam at next visit.   Labs and medications reviewed.   Diet Order:  Diet regular Room service appropriate? Yes; Fluid consistency: Thin  Skin:   (Incision on R leg)  Last BM:  2/7  Height:   Ht Readings from Last 1 Encounters:  08/16/16 '5\' 10"'$  (1.778 m)    Weight:   Wt Readings from Last 1 Encounters:  08/16/16 178 lb (80.7 kg)    Ideal Body Weight:  75.45 kg  BMI:  Body mass index is 25.54  kg/m.  Estimated Nutritional Needs:   Kcal:  2000-2200  Protein:  100-110 grams  Fluid:  2-2.2 L/day  EDUCATION NEEDS:   No education needs identified at this time  Corrin Parker, MS, RD, LDN Pager # [phone] After hours/ weekend pager # [phone]

## 2016-08-17 NOTE — Anesthesia Preprocedure Evaluation (Addendum)
Anesthesia Evaluation  Patient identified by MRN, date of birth, ID band Patient awake    Reviewed: Allergy & Precautions, NPO status , Patient's Chart, lab work & pertinent test results  Airway Mallampati: II  TM Distance: >3 FB Neck ROM: Full    Dental no notable dental hx. (+) Poor Dentition   Pulmonary Current Smoker,    Pulmonary exam normal breath sounds clear to auscultation       Cardiovascular hypertension, Pt. on medications and Pt. on home beta blockers + DVT  Normal cardiovascular exam Rhythm:Regular Rate:Normal     Neuro/Psych CVA, Residual Symptoms negative neurological ROS  negative psych ROS   GI/Hepatic negative GI ROS, (+)     substance abuse  alcohol use and marijuana use,   Endo/Other  negative endocrine ROS  Renal/GU negative Renal ROS  negative genitourinary   Musculoskeletal negative musculoskeletal ROS (+)   Abdominal   Peds negative pediatric ROS (+)  Hematology negative hematology ROS (+)   Anesthesia Other Findings   Reproductive/Obstetrics negative OB ROS                            Anesthesia Physical Anesthesia Plan  ASA: III  Anesthesia Plan: General   Post-op Pain Management:    Induction: Intravenous  Airway Management Planned: Oral ETT  Additional Equipment:   Intra-op Plan:   Post-operative Plan: Extubation in OR  Informed Consent: I have reviewed the patients History and Physical, chart, labs and discussed the procedure including the risks, benefits and alternatives for the proposed anesthesia with the patient or authorized representative who has indicated his/her understanding and acceptance.   Dental advisory given  Plan Discussed with: CRNA  Anesthesia Plan Comments:        Anesthesia Quick Evaluation

## 2016-08-17 NOTE — Transfer of Care (Signed)
Immediate Anesthesia Transfer of Care Note  Patient: Tanner [name]  Procedure(s) Performed: Procedure(s): INTRAMEDULLARY (IM) NAIL RIGHT HIP (Right)  Patient Location: PACU  Anesthesia Type:General  Level of Consciousness: awake, alert  and oriented  Airway & Oxygen Therapy: Patient Spontanous Breathing and Patient connected to nasal cannula oxygen  Post-op Assessment: Report given to RN, Post -op Vital signs reviewed and stable and Patient moving all extremities X 4  Post vital signs: Reviewed and stable  Last Vitals:  Vitals:   08/16/16 2346 08/17/16 0443  BP: 115/68 112/60  Pulse: 66 (!) 59  Resp: 18 18  Temp: 36.9 C 36.9 C    Last Pain:  Vitals:   08/17/16 0650  TempSrc:   PainSc: 10-Worst pain ever      Patients Stated Pain Goal: 3 (33/54/56 2563)  Complications: No apparent anesthesia complications

## 2016-08-17 NOTE — Consult Note (Signed)
Reason for Consult:R hip fx Referring Physician: Candice Lunney is an 61 y.o. male.  HPI: Alcoholic intoxicated yesterday fell on R hip.  Consulted for surgical management.  Pain is severe with movement, R hip.  Denied other injury.  Past Medical History:  Diagnosis Date  . Alcohol abuse   . Alcoholic hepatitis   . Allergy   . Anxiety   . 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 C. difficile colitis   . Depression   . DVT (deep venous thrombosis) ([place]) 2015  . GERD (gastroesophageal reflux disease)   . Hypertension   . Ileus ([place]) 09/2013  . 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 Shortness of breath    new onset- occasionally-at rest and exertion  . Substance abuse   . TIA (transient ischemic attack) 08/2013    Past Surgical History:  Procedure Laterality Date  . HAND SURGERY Right   . HERNIA REPAIR    . LYMPHADENECTOMY Bilateral 08/29/2013   Procedure: LYMPHADENECTOMY "BILATERAL PELVIC LYMPH NODE DISSECTION";  Surgeon: Molli Hazard, MD;  Location: [facility];  Service: Urology;  Laterality: Bilateral;  . ORIF ANKLE FRACTURE Right 12/10/2012   Procedure: OPEN REDUCTION INTERNAL FIXATION (ORIF) ANKLE FRACTURE;  Surgeon: Hessie Dibble, MD;  Location: [facility];  Service: Orthopedics;  Laterality: Right;  . ROBOT ASSISTED LAPAROSCOPIC RADICAL PROSTATECTOMY N/A 08/29/2013   Procedure: ROBOTIC ASSISTED LAPAROSCOPIC RADICAL PROSTATECTOMY LEVEL 2, Lysis of adhesions;  Surgeon: Molli Hazard, MD;  Location: [facility];  Service: Urology;  Laterality: N/A;    Family History  Problem Relation Age of Onset  . Lung cancer Father   . Arthritis Mother   . Pancreatic cancer Sister     Social History:  reports that he has been smoking.  He has a 45.00 pack-year smoking history. He has never used smokeless tobacco. He reports that he drinks about 1.8 - 3.6 oz of alcohol per week . He reports that he uses drugs, including Marijuana, about 7 times per week.  Allergies:   Allergies  Allergen Reactions  . Dilaudid [Hydromorphone Hcl] Nausea And Vomiting    Pre pt history  . Oxycodone Nausea And Vomiting  . Procaine Hcl Other (See Comments)    Patient states he is allergic to novicaine  . Zofran [Ondansetron] Nausea And Vomiting  . Sulfa Antibiotics Rash    Medications: I have reviewed the patient's current medications.  Results for orders placed or performed during the hospital encounter of 08/16/16 (from the past 48 hour(s))  CBC     Status: Abnormal   Collection Time: 08/17/16 12:01 AM  Result Value Ref Range   WBC 7.5 4.0 - 10.5 K/uL   RBC 3.75 (L) 4.22 - 5.81 MIL/uL   Hemoglobin 12.6 (L) 13.0 - 17.0 g/dL   HCT 36.4 (L) 39.0 - 52.0 %   MCV 97.1 78.0 - 100.0 fL   MCH 33.6 26.0 - 34.0 pg   MCHC 34.6 30.0 - 36.0 g/dL   RDW 13.3 11.5 - 15.5 %   Platelets 200 150 - 400 K/uL  Protime-INR     Status: None   Collection Time: 08/17/16 12:01 AM  Result Value Ref Range   Prothrombin Time 13.0 11.4 - 15.2 seconds   INR 0.98   Comprehensive metabolic panel     Status: Abnormal   Collection Time: 08/17/16 12:01 AM  Result Value Ref Range   Sodium 129 (L) 135 - 145  mmol/L   Potassium 3.9 3.5 - 5.1 mmol/L   Chloride 94 (L) 101 - 111 mmol/L   CO2 25 22 - 32 mmol/L   Glucose, Bld 92 65 - 99 mg/dL   BUN 5 (L) 6 - 20 mg/dL   Creatinine, Ser 0.91 0.61 - 1.24 mg/dL   Calcium 8.4 (L) 8.9 - 10.3 mg/dL   Total Protein 6.1 (L) 6.5 - 8.1 g/dL   Albumin 3.1 (L) 3.5 - 5.0 g/dL   AST 40 15 - 41 U/L   ALT 34 17 - 63 U/L   Alkaline Phosphatase 51 38 - 126 U/L   Total Bilirubin 0.5 0.3 - 1.2 mg/dL   GFR calc non Af Amer >60 >60 mL/min   GFR calc Af Amer >60 >60 mL/min    Comment: (NOTE) The eGFR has been calculated using the CKD EPI equation. This calculation has not been validated in all clinical situations. eGFR's persistently <60 mL/min signify possible Chronic Kidney Disease.    Anion gap 10 5 - 15  Type and screen [place]      Status: None   Collection Time: 08/17/16 12:05 AM  Result Value Ref Range   ABO/RH(D) A POS    Antibody Screen NEG    Sample Expiration 08/20/2016   ABO/Rh     Status: None   Collection Time: 08/17/16 12:05 AM  Result Value Ref Range   ABO/RH(D) A POS   Surgical pcr screen     Status: None   Collection Time: 08/17/16  2:31 AM  Result Value Ref Range   MRSA, PCR NEGATIVE NEGATIVE   Staphylococcus aureus NEGATIVE NEGATIVE    Comment:        The Xpert SA Assay (FDA approved for NASAL specimens in patients over 26 years of age), is one component of a comprehensive surveillance program.  Test performance has been validated by [facility] for patients greater than or equal to 27 year old. It is not intended to diagnose infection nor to guide or monitor treatment.   Urinalysis, Routine w reflex microscopic     Status: None   Collection Time: 08/17/16  2:48 AM  Result Value Ref Range   Color, Urine YELLOW YELLOW   APPearance CLEAR CLEAR   Specific Gravity, Urine 1.012 1.005 - 1.030   pH 5.0 5.0 - 8.0   Glucose, UA NEGATIVE NEGATIVE mg/dL   Hgb urine dipstick NEGATIVE NEGATIVE   Bilirubin Urine NEGATIVE NEGATIVE   Ketones, ur NEGATIVE NEGATIVE mg/dL   Protein, ur NEGATIVE NEGATIVE mg/dL   Nitrite NEGATIVE NEGATIVE   Leukocytes, UA NEGATIVE NEGATIVE    Dg Chest 1 View  Result Date: 08/16/2016 CLINICAL DATA:  Patient fell resulting in right hip fracture. Preop. EXAM: CHEST 1 VIEW COMPARISON:  07/23/2015 CT chest FINDINGS: Chronic T8 compression fracture with dextroconvex scoliosis of the lower thoracic spine is stable. Heart is normal in size. There is no mediastinal hematoma. Lungs are clear. IMPRESSION: Chronic T8 compression with dextroscoliosis of the lower thoracic spine. No acute cardiopulmonary abnormality. Electronically Signed   By: Ashley Royalty M.D.   On: 08/16/2016 20:10   Ct Head Wo Contrast  Result Date: 08/16/2016 CLINICAL DATA:  Neck pain after fall today EXAM: CT  HEAD WITHOUT CONTRAST CT CERVICAL SPINE WITHOUT CONTRAST TECHNIQUE: Multidetector CT imaging of the head and cervical spine was performed following the standard protocol without intravenous contrast. Multiplanar CT image reconstructions of the cervical spine were also generated. COMPARISON:  02/18/2015, MRI 09/02/2013  FINDINGS: CT HEAD FINDINGS Brain: No acute territorial infarction or intracranial hemorrhage is visualized. No focal mass, mass effect or midline shift. Evidence of old lacunar infarct in the left posterior basal ganglia with extension to the periventricular white matter. The ventricles are stable in size. Minimal periventricular white matter hypodensity consistent with small vessel change. Mild to moderate atrophy. Vascular: No hyperdense vessels.  Carotid artery calcifications. Skull: Normal. Negative for fracture or focal lesion. Sinuses/Orbits: Mucosal thickening in the ethmoid sinuses. No acute orbital abnormality. Other: None CT CERVICAL SPINE FINDINGS Alignment: No subluxation.  Facet alignment is maintained. Skull base and vertebrae: Craniovertebral junction appears intact. Vertebral body heights are normal. There is no fracture identified. Soft tissues and spinal canal: No prevertebral fluid or swelling. No visible canal hematoma. Disc levels: Multilevel degenerative disc disease, moderate at C5-C6 and C6-C7. Multilevel hypertrophic facet arthropathy. Multiple levels of mild bilateral foraminal stenosis involving the mid cervical spine. Upper chest: Mild emphysematous disease at the apices. Nodular density in the apex of the right lung is suspected to be secondary to pulmonary scarring. Thyroid normal. Other: None IMPRESSION: 1. No definite CT evidence for acute intracranial abnormality. 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 2. No acute fracture or malalignment of the cervical spine. Electronically Signed   By: Donavan Foil M.D.   On: 08/16/2016 19:53   Ct Cervical  Spine Wo Contrast  Result Date: 08/16/2016 CLINICAL DATA:  Neck pain after fall today EXAM: CT HEAD WITHOUT CONTRAST CT CERVICAL SPINE WITHOUT CONTRAST TECHNIQUE: Multidetector CT imaging of the head and cervical spine was performed following the standard protocol without intravenous contrast. Multiplanar CT image reconstructions of the cervical spine were also generated. COMPARISON:  02/18/2015, MRI 09/02/2013 FINDINGS: CT HEAD FINDINGS Brain: No acute territorial infarction or intracranial hemorrhage is visualized. No focal mass, mass effect or midline shift. Evidence of old lacunar infarct in the left posterior basal ganglia with extension to the periventricular white matter. The ventricles are stable in size. Minimal periventricular white matter hypodensity consistent with small vessel change. Mild to moderate atrophy. Vascular: No hyperdense vessels.  Carotid artery calcifications. Skull: Normal. Negative for fracture or focal lesion. Sinuses/Orbits: Mucosal thickening in the ethmoid sinuses. No acute orbital abnormality. Other: None CT CERVICAL SPINE FINDINGS Alignment: No subluxation.  Facet alignment is maintained. Skull base and vertebrae: Craniovertebral junction appears intact. Vertebral body heights are normal. There is no fracture identified. Soft tissues and spinal canal: No prevertebral fluid or swelling. No visible canal hematoma. Disc levels: Multilevel degenerative disc disease, moderate at C5-C6 and C6-C7. Multilevel hypertrophic facet arthropathy. Multiple levels of mild bilateral foraminal stenosis involving the mid cervical spine. Upper chest: Mild emphysematous disease at the apices. Nodular density in the apex of the right lung is suspected to be secondary to pulmonary scarring. Thyroid normal. Other: None IMPRESSION: 1. No definite CT evidence for acute intracranial abnormality. 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 2. No acute fracture or malalignment of the  cervical spine. Electronically Signed   By: Donavan Foil M.D.   On: 08/16/2016 19:53   Dg Hip Unilat W Or Wo Pelvis 2-3 Views Right  Result Date: 08/16/2016 CLINICAL DATA:  Patient fell.  Hip pain. EXAM: DG HIP (WITH OR WITHOUT PELVIS) 2-3V RIGHT COMPARISON:  None. FINDINGS: Acute, closed, varus angulated basicervical fracture of the proximal right femur. There is a sagittally oriented fracture as well at the base of the right greater trochanter without avulsion nor displacement.  Femoral heads are seated within their hip joints with slight joint space narrowing bilaterally. The bony pelvis appears intact. IMPRESSION: Acute, closed, basicervical fracture of the right femur with varus angulation. Incomplete nondisplaced fracture at the base of the greater trochanter is also seen right. Electronically Signed   By: Ashley Royalty M.D.   On: 08/16/2016 20:07    Review of Systems  Respiratory: Positive for cough.   All other systems reviewed and are negative.  Blood pressure 112/60, pulse (!) 59, temperature 98.4 F (36.9 C), temperature source Oral, resp. rate 18, height _0  (1.778 m), weight 80.7 kg (178 lb), SpO2 100 %. Physical Exam  Constitutional: He appears well-developed.  HENT:  Head: Atraumatic.  Cardiovascular: Intact distal pulses.   Respiratory: Effort normal.  Musculoskeletal:  R LE shortened, ER. NVID TTP at hip  Neurological: He is alert.  Skin: Skin is warm and dry.  Psychiatric: He has a normal mood and affect.    Assessment/Plan: R basicervical hip fx Plan ORIF today NPO Risks / benefits of surgery discussed Consent on chart  NPO for OR Preop antibiotics H/o DVT will need chemoproph p/o.   Nita Sells 08/17/2016, 6:33 AM

## 2016-08-17 NOTE — Progress Notes (Signed)
Pt left floor for OR at this time.   Ave Filter, RN

## 2016-08-18 ENCOUNTER — Encounter (HOSPITAL_COMMUNITY): Payer: Self-pay | Admitting: Orthopedic Surgery

## 2016-08-18 DIAGNOSIS — E441 Mild protein-calorie malnutrition: Secondary | ICD-10-CM

## 2016-08-18 LAB — BASIC METABOLIC PANEL
ANION GAP: 10 (ref 5–15)
BUN: 7 mg/dL (ref 6–20)
CO2: 27 mmol/L (ref 22–32)
Calcium: 8.7 mg/dL — ABNORMAL LOW (ref 8.9–10.3)
Chloride: 91 mmol/L — ABNORMAL LOW (ref 101–111)
Creatinine, Ser: 0.74 mg/dL (ref 0.61–1.24)
Glucose, Bld: 112 mg/dL — ABNORMAL HIGH (ref 65–99)
POTASSIUM: 4.2 mmol/L (ref 3.5–5.1)
SODIUM: 128 mmol/L — AB (ref 135–145)

## 2016-08-18 LAB — CBC
HEMATOCRIT: 32.7 % — AB (ref 39.0–52.0)
Hemoglobin: 11.1 g/dL — ABNORMAL LOW (ref 13.0–17.0)
MCH: 33.3 pg (ref 26.0–34.0)
MCHC: 33.9 g/dL (ref 30.0–36.0)
MCV: 98.2 fL (ref 78.0–100.0)
Platelets: 185 10*3/uL (ref 150–400)
RBC: 3.33 MIL/uL — ABNORMAL LOW (ref 4.22–5.81)
RDW: 13.3 % (ref 11.5–15.5)
WBC: 9.5 10*3/uL (ref 4.0–10.5)

## 2016-08-18 LAB — TYPE AND SCREEN
ABO/RH(D): A POS
ANTIBODY SCREEN: NEGATIVE

## 2016-08-18 LAB — PHOSPHORUS: PHOSPHORUS: 2.3 mg/dL — AB (ref 2.5–4.6)

## 2016-08-18 LAB — MAGNESIUM: Magnesium: 1.7 mg/dL (ref 1.7–2.4)

## 2016-08-18 LAB — VITAMIN D 25 HYDROXY (VIT D DEFICIENCY, FRACTURES): VIT D 25 HYDROXY: 7.6 ng/mL — AB (ref 30.0–100.0)

## 2016-08-18 MED ORDER — DOCUSATE SODIUM 100 MG PO CAPS: 100.0000 mg | ORAL_CAPSULE | Freq: Three times a day (TID) | ORAL | 0 refills | Status: DC | PRN

## 2016-08-18 MED ORDER — OXYCODONE-ACETAMINOPHEN 5-325 MG PO TABS: 1.0000 | ORAL_TABLET | ORAL | 0 refills | Status: DC | PRN

## 2016-08-18 MED ORDER — MAGNESIUM SULFATE 2 GM/50ML IV SOLN
2.0000 g | Freq: Once | INTRAVENOUS | Status: AC
  Administered 2016-08-18: 2 g via INTRAVENOUS
  Filled 2016-08-18 (×2): qty 50

## 2016-08-18 NOTE — Anesthesia Postprocedure Evaluation (Signed)
Anesthesia Post Note  Patient: Tanner [name]  Procedure(s) Performed: Procedure(s) (LRB): INTRAMEDULLARY (IM) NAIL RIGHT HIP (Right)  Patient location during evaluation: PACU Anesthesia Type: General Level of consciousness: awake and alert Pain management: pain level controlled Vital Signs Assessment: post-procedure vital signs reviewed and stable Respiratory status: spontaneous breathing, nonlabored ventilation, respiratory function stable and patient connected to nasal cannula oxygen Cardiovascular status: blood pressure returned to baseline and stable Postop Assessment: no signs of nausea or vomiting Anesthetic complications: no       Last Vitals:  Vitals:   08/18/16 0511 08/18/16 0852  BP: 131/83 118/66  Pulse: 64 79  Resp:    Temp: 37.1 C     Last Pain:  Vitals:   08/18/16 1105  TempSrc:   PainSc: 4                  Montez Hageman

## 2016-08-18 NOTE — Progress Notes (Signed)
PROGRESS NOTE        PATIENT DETAILS Name: Tanner [name] Age: 61 y.o. Sex: male Date of Birth: [birth year] Admit Date: 08/16/2016 Admitting Physician Karmen Bongo, MD GLO:VFIEPP, Belenda Cruise, [name]  Brief Narrative: Patient is a 61 y.o. male with past medical history of chronic alcoholism, prostate cancer, prior CVA, prior history of DVT (anticoagulation discontinued February 2017), hypertension admitted with a right hip fracture following a mechanical fall. Orthopedic consulted, patient underwent operative repair on 2/8. See below for further details  Subjective: Awake alert-chest pain or shortness of breath. Very minimally tremulous.  Assessment/Plan: Right hip fracture: Secondary to a mechanical fall. Underwent operative repair on 2/8. Postop care will defer to orthopedics-recommendations are for weightbearing as tolerated, and prophylactic Lovenox. Await PT evaluation.  Hyponatremia: Likely secondary to be a beer potomania. Stop IV fluids and follow chemistry trend. A further decrease as we can initiate workup.  Alcohol withdrawal: Mildly tremulous this morning, but awake and alert. Continue scheduled Librium and prn Ativan per protocol. Last drink was on 2/7.  EtOH use: Attempted counseling, and claims that he is tried to quit in the past and has only gone around 3-4 months without using alcohol before he relapses. We will get social work evaluation prior to discharge.  History of DVT: On Lovenox prophylaxis per orthopedics-especially given his prior history of venous thromboembolism  Hypertension: Controlled, continue metoprolol, amlodipine, and lisinopril  Tobacco abuse: Counseled.  History of prostate cancer: Outpatient follow-up with PCP/primary oncologist and urologist  DVT Prophylaxis: Prophylactic Lovenox   Code Status: Full code  Family Communication: None at bedside  Disposition Plan: Remain inpatient-but will plan on Home health vs SNF on  discharge-await PT eval  Antimicrobial agents: Anti-infectives    Start     Dose/Rate Route Frequency Ordered Stop   08/17/16 1600  ceFAZolin (ANCEF) IVPB 2g/100 mL premix     2 g 200 mL/hr over 30 Minutes Intravenous Every 6 hours 08/17/16 1220 08/17/16 2232   08/17/16 0800  ceFAZolin (ANCEF) IVPB 2g/100 mL premix     2 g 200 mL/hr over 30 Minutes Intravenous To ShortStay Surgical 08/17/16 0741 08/17/16 1025      Procedures: INTRAMEDULLARY (IM) NAIL RIGHT HIP 2/8>>  CONSULTS:  orthopedic surgery  Time spent: 25- minutes-Greater than 50% of this time was spent in counseling, explanation of diagnosis, planning of further management, and coordination of care.  MEDICATIONS: Scheduled Meds: . amLODipine  10 mg Oral Daily  . chlordiazePOXIDE  10 mg Oral TID  . enoxaparin (LOVENOX) injection  40 mg Subcutaneous Q24H  . feeding supplement (ENSURE ENLIVE)  237 mL Oral BID BM  . FLUoxetine  40 mg Oral Daily  . folic acid  1 mg Oral Daily  . lisinopril  40 mg Oral Daily  . metoprolol  100 mg Oral BID  . multivitamin with minerals  1 tablet Oral Daily  . pantoprazole  80 mg Oral Daily  . thiamine  100 mg Oral Daily   Continuous Infusions: . lactated ringers     PRN Meds:.acetaminophen **OR** acetaminophen, HYDROcodone-acetaminophen, LORazepam **OR** LORazepam, menthol-cetylpyridinium **OR** phenol, metoCLOPramide **OR** metoCLOPramide (REGLAN) injection, morphine injection, traZODone   PHYSICAL EXAM: Vital signs: Vitals:   08/17/16 1305 08/17/16 2100 08/18/16 0511 08/18/16 0852  BP: (!) 141/84 (!) 146/79 131/83 118/66  Pulse: 82 74 64 79  Resp: 16  Temp: 98 F (36.7 C) 97.9 F (36.6 C) 98.7 F (37.1 C)   TempSrc: Oral Oral Oral   SpO2: 98% 94% 94%   Weight:      Height:       Filed Weights   08/16/16 1811  Weight: 80.7 kg (178 lb)   Body mass index is 25.54 kg/m.   General appearance :Awake, alert, not in any distress.  Eyes:, pupils equally reactive to  light and accomodation,no scleral icterus. HEENT: Atraumatic and Normocephalic Neck: supple, no JVD. No cervical lymphadenopathy.  Resp:Good air entry bilaterally, no added sounds  CVS: S1 S2 regular, no murmurs.  GI: Bowel sounds present, Non tender and not distended with no gaurding, rigidity or rebound.No organomegaly Extremities: B/L Lower Ext shows no edema, both legs are warm to touch Neurology:  speech clear,Non focal, sensation is grossly intact.Minimally tremulous Psychiatric: Normal judgment and insight. Alert and oriented x 3.  Musculoskeletal:No digital cyanosis Skin:No Rash, warm and dry Wounds:N/A  I have personally reviewed following labs and imaging studies  LABORATORY DATA: CBC:  Recent Labs Lab 08/17/16 0001 08/18/16 0415  WBC 7.5 9.5  HGB 12.6* 11.1*  HCT 36.4* 32.7*  MCV 97.1 98.2  PLT 200 485    Basic Metabolic Panel:  Recent Labs Lab 08/17/16 0001 08/18/16 0415  NA 129* 128*  K 3.9 4.2  CL 94* 91*  CO2 25 27  GLUCOSE 92 112*  BUN 5* 7  CREATININE 0.91 0.74  CALCIUM 8.4* 8.7*  MG  --  1.7  PHOS  --  2.3*    GFR: Estimated Creatinine Clearance: 103.9 mL/min (by C-G formula based on SCr of 0.74 mg/dL).  Liver Function Tests:  Recent Labs Lab 08/17/16 0001  AST 40  ALT 34  ALKPHOS 51  BILITOT 0.5  PROT 6.1*  ALBUMIN 3.1*   No results for input(s): LIPASE, AMYLASE in the last 168 hours. No results for input(s): AMMONIA in the last 168 hours.  Coagulation Profile:  Recent Labs Lab 08/17/16 0001  INR 0.98    Cardiac Enzymes: No results for input(s): CKTOTAL, CKMB, CKMBINDEX, TROPONINI in the last 168 hours.  BNP (last 3 results) No results for input(s): PROBNP in the last 8760 hours.  HbA1C: No results for input(s): HGBA1C in the last 72 hours.  CBG: No results for input(s): GLUCAP in the last 168 hours.  Lipid Profile: No results for input(s): CHOL, HDL, LDLCALC, TRIG, CHOLHDL, LDLDIRECT in the last 72  hours.  Thyroid Function Tests: No results for input(s): TSH, T4TOTAL, FREET4, T3FREE, THYROIDAB in the last 72 hours.  Anemia Panel: No results for input(s): VITAMINB12, FOLATE, FERRITIN, TIBC, IRON, RETICCTPCT in the last 72 hours.  Urine analysis:    Component Value Date/Time   COLORURINE YELLOW 08/17/2016 [place] 08/17/2016 0248   LABSPEC 1.012 08/17/2016 0248   PHURINE 5.0 08/17/2016 0248   GLUCOSEU NEGATIVE 08/17/2016 0248   HGBUR NEGATIVE 08/17/2016 0248   BILIRUBINUR NEGATIVE 08/17/2016 0248   KETONESUR NEGATIVE 08/17/2016 0248   PROTEINUR NEGATIVE 08/17/2016 0248   UROBILINOGEN 0.2 09/26/2013 1457   NITRITE NEGATIVE 08/17/2016 0248   LEUKOCYTESUR NEGATIVE 08/17/2016 0248    Sepsis Labs: Lactic Acid, Venous    Component Value Date/Time   LATICACIDVEN 0.9 09/06/2013 2251    MICROBIOLOGY: Recent Results (from the past 240 hour(s))  Surgical pcr screen     Status: None   Collection Time: 08/17/16  2:31 AM  Result Value Ref Range Status   MRSA,  PCR NEGATIVE NEGATIVE Final   Staphylococcus aureus NEGATIVE NEGATIVE Final    Comment:        The Xpert SA Assay (FDA approved for NASAL specimens in patients over 62 years of age), is one component of a comprehensive surveillance program.  Test performance has been validated by [facility] for patients greater than or equal to 65 year old. It is not intended to diagnose infection nor to guide or monitor treatment.     RADIOLOGY STUDIES/RESULTS: Dg Chest 1 View  Result Date: 08/16/2016 CLINICAL DATA:  Patient fell resulting in right hip fracture. Preop. EXAM: CHEST 1 VIEW COMPARISON:  07/23/2015 CT chest FINDINGS: Chronic T8 compression fracture with dextroconvex scoliosis of the lower thoracic spine is stable. Heart is normal in size. There is no mediastinal hematoma. Lungs are clear. IMPRESSION: Chronic T8 compression with dextroscoliosis of the lower thoracic spine. No acute cardiopulmonary  abnormality. Electronically Signed   By: Ashley Royalty M.D.   On: 08/16/2016 20:10   Ct Head Wo Contrast  Result Date: 08/16/2016 CLINICAL DATA:  Neck pain after fall today EXAM: CT HEAD WITHOUT CONTRAST CT CERVICAL SPINE WITHOUT CONTRAST TECHNIQUE: Multidetector CT imaging of the head and cervical spine was performed following the standard protocol without intravenous contrast. Multiplanar CT image reconstructions of the cervical spine were also generated. COMPARISON:  02/18/2015, MRI 09/02/2013 FINDINGS: CT HEAD FINDINGS Brain: No acute territorial infarction or intracranial hemorrhage is visualized. No focal mass, mass effect or midline shift. Evidence of old lacunar infarct in the left posterior basal ganglia with extension to the periventricular white matter. The ventricles are stable in size. Minimal periventricular white matter hypodensity consistent with small vessel change. Mild to moderate atrophy. Vascular: No hyperdense vessels.  Carotid artery calcifications. Skull: Normal. Negative for fracture or focal lesion. Sinuses/Orbits: Mucosal thickening in the ethmoid sinuses. No acute orbital abnormality. Other: None CT CERVICAL SPINE FINDINGS Alignment: No subluxation.  Facet alignment is maintained. Skull base and vertebrae: Craniovertebral junction appears intact. Vertebral body heights are normal. There is no fracture identified. Soft tissues and spinal canal: No prevertebral fluid or swelling. No visible canal hematoma. Disc levels: Multilevel degenerative disc disease, moderate at C5-C6 and C6-C7. Multilevel hypertrophic facet arthropathy. Multiple levels of mild bilateral foraminal stenosis involving the mid cervical spine. Upper chest: Mild emphysematous disease at the apices. Nodular density in the apex of the right lung is suspected to be secondary to pulmonary scarring. Thyroid normal. Other: None IMPRESSION: 1. No definite CT evidence for acute intracranial abnormality. Evidence of old left basal  ganglial and white matter infarct. 2. No acute fracture or malalignment of the cervical spine. Electronically Signed   By: Donavan Foil M.D.   On: 08/16/2016 19:53   Ct Cervical Spine Wo Contrast  Result Date: 08/16/2016 CLINICAL DATA:  Neck pain after fall today EXAM: CT HEAD WITHOUT CONTRAST CT CERVICAL SPINE WITHOUT CONTRAST TECHNIQUE: Multidetector CT imaging of the head and cervical spine was performed following the standard protocol without intravenous contrast. Multiplanar CT image reconstructions of the cervical spine were also generated. COMPARISON:  02/18/2015, MRI 09/02/2013 FINDINGS: CT HEAD FINDINGS Brain: No acute territorial infarction or intracranial hemorrhage is visualized. No focal mass, mass effect or midline shift. Evidence of old lacunar infarct in the left posterior basal ganglia with extension to the periventricular white matter. The ventricles are stable in size. Minimal periventricular white matter hypodensity consistent with small vessel change. Mild to moderate atrophy. Vascular: No hyperdense vessels.  Carotid artery calcifications. Skull:  Normal. Negative for fracture or focal lesion. Sinuses/Orbits: Mucosal thickening in the ethmoid sinuses. No acute orbital abnormality. Other: None CT CERVICAL SPINE FINDINGS Alignment: No subluxation.  Facet alignment is maintained. Skull base and vertebrae: Craniovertebral junction appears intact. Vertebral body heights are normal. There is no fracture identified. Soft tissues and spinal canal: No prevertebral fluid or swelling. No visible canal hematoma. Disc levels: Multilevel degenerative disc disease, moderate at C5-C6 and C6-C7. Multilevel hypertrophic facet arthropathy. Multiple levels of mild bilateral foraminal stenosis involving the mid cervical spine. Upper chest: Mild emphysematous disease at the apices. Nodular density in the apex of the right lung is suspected to be secondary to pulmonary scarring. Thyroid normal. Other: None  IMPRESSION: 1. No definite CT evidence for acute intracranial abnormality. 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 2. No acute fracture or malalignment of the cervical spine. Electronically Signed   By: Donavan Foil M.D.   On: 08/16/2016 19:53   Dg C-arm 1-60 Min  Result Date: 08/17/2016 CLINICAL DATA:  I am nail placement in the right femur EXAM: DG C-ARM 61-120 MIN; RIGHT FEMUR 2 VIEWS COMPARISON:  None. FLUOROSCOPY TIME:  1 minutes 1 second FINDINGS: Interval ORIF of a right basicervical proximal femoral fracture with an intramedullary nail and interlocking cannulated femoral neck screw without failure or complication. Anatomic alignment. IMPRESSION: Interval ORIF of a right basicervical proximal femoral fracture. Electronically Signed   By: Kathreen Devoid   On: 08/17/2016 11:21   Dg Hip Unilat W Or Wo Pelvis 2-3 Views Right  Result Date: 08/16/2016 CLINICAL DATA:  Patient fell.  Hip pain. EXAM: DG HIP (WITH OR WITHOUT PELVIS) 2-3V RIGHT COMPARISON:  None. FINDINGS: Acute, closed, varus angulated basicervical fracture of the proximal right femur. There is a sagittally oriented fracture as well at the base of the right greater trochanter without avulsion nor displacement. Femoral heads are seated within their hip joints with slight joint space narrowing bilaterally. The bony pelvis appears intact. IMPRESSION: Acute, closed, basicervical fracture of the right femur with varus angulation. Incomplete nondisplaced fracture at the base of the greater trochanter is also seen right. Electronically Signed   By: Ashley Royalty M.D.   On: 08/16/2016 20:07   Dg Femur, Min 2 Views Right  Result Date: 08/17/2016 CLINICAL DATA:  I am nail placement in the right femur EXAM: DG C-ARM 61-120 MIN; RIGHT FEMUR 2 VIEWS COMPARISON:  None. FLUOROSCOPY TIME:  1 minutes 1 second FINDINGS: Interval ORIF of a right basicervical proximal femoral fracture with an intramedullary nail and interlocking cannulated  femoral neck screw without failure or complication. Anatomic alignment. IMPRESSION: Interval ORIF of a right basicervical proximal femoral fracture. Electronically Signed   By: Kathreen Devoid   On: 08/17/2016 11:21     LOS: 2 days   Oren Binet, MD  [facility] Pager:[phone]  If 7PM-7AM, please contact night-coverage [email or web address] Password [email or web address] 08/18/2016, 11:57 AM

## 2016-08-18 NOTE — Progress Notes (Signed)
   PATIENT ID: Tanner [name]   1 Day Post-Op Procedure(s) (LRB): INTRAMEDULLARY (IM) NAIL RIGHT HIP (Right)  Subjective: Doing well, mod pain right hip with mvmt, expected. Otherwise no complaints.  Objective:  Vitals:   08/17/16 2100 08/18/16 0511  BP: (!) 146/79 131/83  Pulse: 74 64  Resp:    Temp: 97.9 F (36.6 C) 98.7 F (37.1 C)     R hip/leg dressings c/d/i Wiggles toes, distally NVI  Labs:   Recent Labs  08/17/16 0001 08/18/16 0415  HGB 12.6* 11.1*   Recent Labs  08/17/16 0001 08/18/16 0415  WBC 7.5 9.5  RBC 3.75* 3.33*  HCT 36.4* 32.7*  PLT 200 185   Recent Labs  08/17/16 0001 08/18/16 0415  NA 129* 128*  K 3.9 4.2  CL 94* 91*  CO2 25 27  BUN 5* 7  CREATININE 0.91 0.74  GLUCOSE 92 112*  CALCIUM 8.4* 8.7*    Assessment and Plan: 1 day s/p right IM nail femoral fx Up with PT today WBAT Dx of DVT- getting lovenox today, reports has taken xalerto postoperatively before and did will with that. We are okay with either anticoagulant, per medicine, x 4 weeks post operatively Pain rx scripts in chart Will likely need d/c to SNF, pending PT recommendation D/c when cleared by PT and medicine Appreciate medical team care for this patient Fu with Dr. Tamera Punt in 2 weeks Will continue to follow   VTE proph: lovenox, SCDs

## 2016-08-18 NOTE — Evaluation (Signed)
Occupational Therapy Evaluation and Discharge Patient Details Name: Tanner [name] MRN: [id number] DOB: 10/07/1955 Today's Date: 08/18/2016    History of Present Illness Pt is a 61 y/o male s/p IM nail Right hip after "I got drunk and fell". Pt  has a past medical history including Alcohol abuse; Alcoholic hepatitis; Allergy; Anxiety; Arthritis; C. difficile colitis; Depression; DVT (2015); Hypertension; Ileus (09/2013); Prostate cancer (05/22/13); TIA (transient ischemic attack) (08/2013). Pt states that his right side is only 85% after TIA. Pt has a past surgical history that includes Hand surgery (Right); ORIF ankle fracture (Right, 12/10/2012); Robot assisted laparoscopic radical prostatectomy (08/29/2013); and Lymphadenectomy (08/29/2013).   Clinical Impression   PTA Pt independent in ADL and mobility with history of falls related to EtOH. Pt very pleasant and willing to work with therapy. No signs or symptoms of EtOH withdrawels this session. Pt currently mod A for LB ADL and min guard for mobility with RW. Pt familiar with compensatory strategies and safety due to previous injuries and has appropriate DME/AE. OT education and assessment complete and OT to sign off at this point. Thank you for the referral.     Follow Up Recommendations  No OT follow up;Supervision - Intermittent    Equipment Recommendations  None recommended by OT (Pt has appropriate DME)    Recommendations for Other Services       Precautions / Restrictions Precautions Precautions: Fall Precaution Comments: History of falls related to EtOH abuse Restrictions Weight Bearing Restrictions: Yes RLE Weight Bearing: Weight bearing as tolerated      Mobility Bed Mobility Overal bed mobility: Needs Assistance Bed Mobility: Supine to Sit     Supine to sit: Min assist     General bed mobility comments: min A for RLE to come to EOB "lifting it up is the hardest part"  Transfers Overall transfer level: Needs  assistance Equipment used: Rolling walker (2 wheeled) Transfers: Sit to/from Stand Sit to Stand: Min assist         General transfer comment: min A for initial power up, good hand placement on EOB    Balance Overall balance assessment: Needs assistance;History of Falls Sitting-balance support: No upper extremity supported;Feet supported Sitting balance-Leahy Scale: Good Sitting balance - Comments: sitting EOB and able to don sock on LLE independently   Standing balance support: No upper extremity supported;During functional activity Standing balance-Leahy Scale: Fair Standing balance comment: able to pull up underwear from knees, requires RW for mobility                            ADL Overall ADL's : Needs assistance/impaired Eating/Feeding: Set up;Sitting   Grooming: Oral care;Wash/dry face;Brushing hair;Set up;Sitting Grooming Details (indicate cue type and reason): able to perform in recliner Upper Body Bathing: Set up;Sitting   Lower Body Bathing: With adaptive equipment;Min guard;Sitting/lateral leans Lower Body Bathing Details (indicate cue type and reason): Pt has AE from previous surgeries and falls Upper Body Dressing : Set up;Sitting   Lower Body Dressing: Moderate assistance;With adaptive equipment;With caregiver independent assisting;Sit to/from stand Lower Body Dressing Details (indicate cue type and reason): able to don underwear with assist to get over RLE, then Pt able to complete the rest with min A for balance in standing Toilet Transfer: Min guard;Ambulation;Comfort height toilet;BSC;RW Toilet Transfer Details (indicate cue type and reason): simulated with recliner     Tub/ Shower Transfer: Walk-in shower;Min guard;With caregiver independent assisting;Ambulation (no room for shower chair inside  shower, but has one at home)   Functional mobility during ADLs: Min guard;Rolling walker       Vision Vision Assessment?: No apparent visual  deficits   Perception     Praxis      Pertinent Vitals/Pain Pain Assessment: 0-10 Pain Score: 5  Pain Location: R hip Pain Descriptors / Indicators: Discomfort;Grimacing;Sore Pain Intervention(s): Monitored during session;Premedicated before session;Repositioned;Ice applied     Hand Dominance Right   Extremity/Trunk Assessment Upper Extremity Assessment Upper Extremity Assessment: Overall WFL for tasks assessed;Generalized weakness   Lower Extremity Assessment Lower Extremity Assessment: RLE deficits/detail RLE Deficits / Details: post op deficits in strength and ROM RLE Sensation: history of peripheral neuropathy ("It burns all the time")       Communication Communication Communication: No difficulties   Cognition Arousal/Alertness: Awake/alert Behavior During Therapy: WFL for tasks assessed/performed Overall Cognitive Status: Within Functional Limits for tasks assessed                     General Comments       Exercises       Shoulder Instructions      Home Living Family/patient expects to be discharged to:: Private residence Living Arrangements: Spouse/significant other (ex wife) Available Help at Discharge: Family;Available PRN/intermittently (daughter lives across the street) Type of Home: Mobile home Home Access: Stairs to enter Entrance Stairs-Number of Steps: 5 Entrance Stairs-Rails: Right;Left;Can reach both Home Layout: One level     Bathroom Shower/Tub: Occupational psychologist: Standard Bathroom Accessibility: Yes   Home Equipment: Environmental consultant - 2 wheels;Crutches;Bedside commode;Shower seat;Adaptive equipment Adaptive Equipment: Reacher;Long-handled shoe horn;Sock aid;Long-handled sponge        Prior Functioning/Environment Level of Independence: Independent                 OT Problem List: Impaired balance (sitting and/or standing);Decreased activity tolerance;Decreased range of motion;Pain   OT  Treatment/Interventions:      OT Goals(Current goals can be found in the care plan section) Acute Rehab OT Goals Patient Stated Goal: to go home OT Goal Formulation: With patient Time For Goal Achievement: 08/25/16 Potential to Achieve Goals: Good  OT Frequency:     Barriers to D/C:            Co-evaluation PT/OT/SLP Co-Evaluation/Treatment: Yes Reason for Co-Treatment: Necessary to address cognition/behavior during functional activity;To address functional/ADL transfers;For patient/therapist safety (History of EtOH withdrawel symptoms in hospital) PT goals addressed during session: Mobility/safety with mobility OT goals addressed during session: ADL's and self-care      End of Session Equipment Utilized During Treatment: Gait belt;Rolling walker Nurse Communication: Mobility status;Weight bearing status (that Pt transferred well, and no chair alarm )  Activity Tolerance: Patient tolerated treatment well Patient left: in chair;with call bell/phone within reach (reminded to call staff if he needed to get up)   Time: 1027-1106 OT Time Calculation (min): 39 min Charges:  OT General Charges $OT Visit: 1 Procedure OT Evaluation $OT Eval Moderate Complexity: 1 Procedure OT Treatments $Self Care/Home Management : 8-22 mins G-Codes:    Merri Ray Aarav Burgett 2016/09/16, 11:30 AM  Hulda Humphrey OTR/L [phone]

## 2016-08-18 NOTE — Evaluation (Signed)
Physical Therapy Evaluation Patient Details Name: Tanner [name] MRN: [id number] DOB: 1956/05/15 Today's Date: 08/18/2016   History of Present Illness  Pt is a 61 y/o male s/p IM nail Right hip after "I got drunk and fell". Pt  has a past medical history including Alcohol abuse; Alcoholic hepatitis; Allergy; Anxiety; Arthritis; C. difficile colitis; Depression; DVT (2015); Hypertension; Ileus (09/2013); Prostate cancer (05/22/13); TIA (transient ischemic attack) (08/2013). Pt states that his right side is only 85% after TIA. Pt has a past surgical history that includes Hand surgery (Right); ORIF ankle fracture (Right, 12/10/2012); Robot assisted laparoscopic radical prostatectomy (08/29/2013); and Lymphadenectomy (08/29/2013).  Clinical Impression  Pt is POD 1 and moving well with therapy. Visit was performed as a co-treat due to the risk of withdrawal symptoms due to ETOH abuse. Prior to admission, pt was completely independent and lives with his ex-wife in a single level mobile home. Pt is able to move with Min A for majority of mobility this session. Pt will benefit from HHPT upon discharge in order to maximize his outcomes and will benefit from continued acute PT services to assist with a smooth transition home.     Follow Up Recommendations Home health PT;Supervision for mobility/OOB    Equipment Recommendations  None recommended by PT    Recommendations for Other Services       Precautions / Restrictions Precautions Precautions: Fall Precaution Comments: History of falls related to EtOH abuse Restrictions Weight Bearing Restrictions: Yes RLE Weight Bearing: Weight bearing as tolerated      Mobility  Bed Mobility Overal bed mobility: Needs Assistance Bed Mobility: Supine to Sit     Supine to sit: Min assist     General bed mobility comments: min A for RLE to come to EOB "lifting it up is the hardest part"  Transfers Overall transfer level: Needs assistance Equipment used: Rolling  walker (2 wheeled) Transfers: Sit to/from Stand Sit to Stand: Min assist         General transfer comment: min A for initial power up, good hand placement on EOB  Ambulation/Gait Ambulation/Gait assistance: Min assist Ambulation Distance (Feet): 120 Feet Assistive device: Rolling walker (2 wheeled) Gait Pattern/deviations: Step-to pattern;Decreased step length - left;Decreased stance time - right;Antalgic Gait velocity: decreased Gait velocity interpretation: Below normal speed for age/gender General Gait Details: Cues for sequencing and heel strike RLE. WC follow due to high risk for fatigue.   Stairs            Wheelchair Mobility    Modified Rankin (Stroke Patients Only)       Balance Overall balance assessment: Needs assistance;History of Falls Sitting-balance support: No upper extremity supported;Feet supported Sitting balance-Leahy Scale: Good Sitting balance - Comments: sitting EOB and able to don sock on LLE independently   Standing balance support: No upper extremity supported;During functional activity Standing balance-Leahy Scale: Fair Standing balance comment: able to pull up underwear from knees, requires RW for mobility                             Pertinent Vitals/Pain Pain Assessment: 0-10 Pain Score: 5  Pain Location: R hip Pain Descriptors / Indicators: Discomfort;Grimacing;Sore Pain Intervention(s): Monitored during session;Premedicated before session;Repositioned;Ice applied    Home Living Family/patient expects to be discharged to:: Private residence Living Arrangements: Spouse/significant other;Other (Comment) (Ex wife) Available Help at Discharge: Family;Available PRN/intermittently Type of Home: Mobile home Home Access: Stairs to enter Entrance Stairs-Rails: Right;Left;Can reach both  Entrance Stairs-Number of Steps: 5 Home Layout: One level Home Equipment: Walker - 2 wheels;Crutches;Bedside commode;Shower seat;Adaptive  equipment      Prior Function Level of Independence: Independent               Hand Dominance   Dominant Hand: Right    Extremity/Trunk Assessment   Upper Extremity Assessment Upper Extremity Assessment: Defer to OT evaluation    Lower Extremity Assessment Lower Extremity Assessment: RLE deficits/detail RLE Deficits / Details: pt with normal post op pain and weakness. At least 3/5 ankle and 2/5 knee and hip per gross functional assessment RLE Sensation: history of peripheral neuropathy ("It burns all the time")       Communication   Communication: No difficulties  Cognition Arousal/Alertness: Awake/alert Behavior During Therapy: WFL for tasks assessed/performed Overall Cognitive Status: Within Functional Limits for tasks assessed                      General Comments      Exercises Total Joint Exercises Ankle Circles/Pumps: AROM;Both;20 reps;Supine Quad Sets: AROM;Right;10 reps;Supine   Assessment/Plan    PT Assessment Patient needs continued PT services  PT Problem List Decreased strength;Decreased range of motion;Decreased activity tolerance;Decreased balance;Decreased mobility;Decreased knowledge of use of DME;Pain          PT Treatment Interventions DME instruction;Gait training;Stair training;Functional mobility training;Therapeutic activities;Therapeutic exercise;Balance training    PT Goals (Current goals can be found in the Care Plan section)  Acute Rehab PT Goals Patient Stated Goal: to go home PT Goal Formulation: With patient Time For Goal Achievement: 08/25/16 Potential to Achieve Goals: Good    Frequency Min 5X/week   Barriers to discharge        Co-evaluation PT/OT/SLP Co-Evaluation/Treatment: Yes Reason for Co-Treatment: Necessary to address cognition/behavior during functional activity;To address functional/ADL transfers;Other (comment) (History of ETOH withdrawl symptoms in hospital) PT goals addressed during session:  Mobility/safety with mobility OT goals addressed during session: ADL's and self-care       End of Session Equipment Utilized During Treatment: Gait belt Activity Tolerance: Patient tolerated treatment well Patient left: in chair;with call bell/phone within reach Nurse Communication: Mobility status         Time: 1027-1106 PT Time Calculation (min) (ACUTE ONLY): 39 min   Charges:   PT Evaluation $PT Eval Moderate Complexity: 1 Procedure PT Treatments $Gait Training: 8-22 mins   PT G Codes:        Scheryl Marten PT, DPT  [phone]  08/18/2016, 1:31 PM

## 2016-08-19 DIAGNOSIS — F10230 Alcohol dependence with withdrawal, uncomplicated: Secondary | ICD-10-CM

## 2016-08-19 LAB — BASIC METABOLIC PANEL
ANION GAP: 9 (ref 5–15)
BUN: 10 mg/dL (ref 6–20)
CHLORIDE: 94 mmol/L — AB (ref 101–111)
CO2: 29 mmol/L (ref 22–32)
Calcium: 8.5 mg/dL — ABNORMAL LOW (ref 8.9–10.3)
Creatinine, Ser: 0.81 mg/dL (ref 0.61–1.24)
GFR calc Af Amer: >60 mL/min (ref >60)
GFR calc non Af Amer: >60 mL/min (ref >60)
GLUCOSE: 111 mg/dL — AB (ref 65–99)
POTASSIUM: 3.6 mmol/L (ref 3.5–5.1)
Sodium: 132 mmol/L — ABNORMAL LOW (ref 135–145)

## 2016-08-19 MED ORDER — DIPHENHYDRAMINE HCL 25 MG PO CAPS
25.0000 mg | ORAL_CAPSULE | Freq: Once | ORAL | Status: AC
  Administered 2016-08-19: 25 mg via ORAL
  Filled 2016-08-19: qty 1

## 2016-08-19 MED ORDER — CHLORDIAZEPOXIDE HCL 5 MG PO CAPS
10.0000 mg | ORAL_CAPSULE | Freq: Two times a day (BID) | ORAL | Status: DC
  Administered 2016-08-19 – 2016-08-20 (×3): 10 mg via ORAL
  Filled 2016-08-19 (×3): qty 2

## 2016-08-19 NOTE — Progress Notes (Signed)
Physical Therapy Treatment Patient Details Name: Tanner [name] MRN: [id number] DOB: [birth year] Today's Date: 08/19/2016    History of Present Illness Pt is a 61 y/o male s/p IM nail Right hip after "I got drunk and fell". Pt  has a past medical history including Alcohol abuse; Alcoholic hepatitis; Allergy; Anxiety; Arthritis; C. difficile colitis; Depression; DVT (2015); Hypertension; Ileus (09/2013); Prostate cancer (05/22/13); TIA (transient ischemic attack) (08/2013). Pt states that his right side is only 85% after TIA. Pt has a past surgical history that includes Hand surgery (Right); ORIF ankle fracture (Right, 12/10/2012); Robot assisted laparoscopic radical prostatectomy (08/29/2013); and Lymphadenectomy (08/29/2013).    PT Comments    Pt stating he doesn't feel as well today- contributes it to alcohol withdrawal but agreeable to participate in PT session.  Requires min assist for RLE management to transition supine to sit EOB. Completes sit to stand transfers with CGA with cues for RLE positioning before sitting.  Ambulated 46' with cga/sba with distance limited due to pt stating he felt dizzy and sweating with cold chills.  Once sitting, pt reported symptoms resolved.  Encouraged pt to start ambulating to bathroom with Nsing and to ambulate in hallway with Nsing 2-3 times a day.  Notified RN.      Follow Up Recommendations  Home health PT;Supervision for mobility/OOB     Equipment Recommendations  None recommended by PT    Recommendations for Other Services       Precautions / Restrictions Precautions Precautions: Fall Precaution Comments: History of falls related to EtOH abuse Restrictions Weight Bearing Restrictions: No RLE Weight Bearing: Weight bearing as tolerated    Mobility  Bed Mobility Overal bed mobility: Needs Assistance Bed Mobility: Supine to Sit     Supine to sit: Min assist     General bed mobility comments: (A) for RLE management  Transfers Overall  transfer level: Needs assistance Equipment used: Rolling walker (2 wheeled) Transfers: Sit to/from Stand Sit to Stand: Min guard         General transfer comment: cues for RLE positioning before sitting  Ambulation/Gait Ambulation/Gait assistance: Min guard Ambulation Distance (Feet): 75 Feet Assistive device: Rolling walker (2 wheeled) Gait Pattern/deviations: Step-to pattern;Decreased weight shift to right;Decreased step length - left Gait velocity: decreased   General Gait Details: cues for sequencing.  Distance limited by pt reporting dizziness, sweating, and cold chills     Stairs            Wheelchair Mobility    Modified Rankin (Stroke Patients Only)       Balance                                    Cognition Arousal/Alertness: Awake/alert Behavior During Therapy: WFL for tasks assessed/performed Overall Cognitive Status: Within Functional Limits for tasks assessed                      Exercises General Exercises - Lower Extremity Ankle Circles/Pumps: Both;15 reps Gluteal Sets: Both;10 reps Hip Flexion/Marching: Right;10 reps;Standing    General Comments        Pertinent Vitals/Pain Pain Assessment: 0-10 Pain Score: 5  Pain Location: R hip Pain Descriptors / Indicators: Aching;Discomfort Pain Intervention(s): Limited activity within patient's tolerance;Monitored during session;Repositioned (pt scheduled for pain medication after PT session)    Home Living Family/patient expects to be discharged to:: Private residence Living Arrangements: Spouse/significant other;Other (Comment)  Prior Function            PT Goals (current goals can now be found in the care plan section) Acute Rehab PT Goals Patient Stated Goal: to go home PT Goal Formulation: With patient Time For Goal Achievement: 08/25/16 Potential to Achieve Goals: Good Progress towards PT goals: Progressing toward goals     Frequency    Min 5X/week      PT Plan Current plan remains appropriate    Co-evaluation             End of Session Equipment Utilized During Treatment: Gait belt Activity Tolerance: Patient limited by pain;Patient limited by fatigue;Other (comment) (dizziness) Patient left: in chair;with call bell/phone within reach;with nursing/sitter in room     Time: 1013-1051 PT Time Calculation (min) (ACUTE ONLY): 38 min  Charges:  $Gait Training: 23-37 mins $Therapeutic Activity: 8-22 mins                    G Codes:      Sena Hitch 08/19/2016, 10:56 AM   Sarajane Marek, PTA [phone] 08/19/2016

## 2016-08-19 NOTE — Progress Notes (Signed)
PROGRESS NOTE        PATIENT DETAILS Name: Tanner [name] Age: 61 y.o. Sex: male Date of Birth: 03-07-1956 Admit Date: 08/16/2016 Admitting Physician Karmen Bongo, MD MWN:[id number], Belenda Cruise, [name]  Brief Narrative: Patient is a 61 y.o. male with past medical history of chronic alcoholism, prostate cancer, prior CVA, prior history of DVT (anticoagulation discontinued February 2017), hypertension admitted with a right hip fracture following a mechanical fall. Orthopedic consulted, patient underwent operative repair on 2/8. See below for further details  Subjective: Awake alert-chest pain or shortness of breath. Hardly any tremors today  Assessment/Plan: Right hip fracture: Secondary to a mechanical fall. Underwent operative repair on 2/8. Postop care will defer to orthopedics-recommendations are for weightbearing as tolerated, and prophylactic Lovenox. PT evaluation completed, recommendations are for home health services.   Hyponatremia: Likely secondary to be a beer potomania. Sodium much improved at 132 today. Doubt any further workup required.   Alcohol withdrawal: Very minimally tremulous this morning, but awake and alert. Continue scheduled Librium but taper to twice a day dosing, continue prn Ativan per protocol. Last drink was on 2/7.  EtOH use: Attempted counseling, and claims that he is tried to quit in the past and has only gone around 3-4 months without using alcohol before he relapses. We will get social work evaluation prior to discharge.  History of DVT: On Lovenox prophylaxis per orthopedics-especially given his prior history of venous thromboembolism  Hypertension: Controlled, continue metoprolol, amlodipine, and lisinopril  Tobacco abuse: Counseled.  History of prostate cancer: Outpatient follow-up with PCP/primary oncologist and urologist  DVT Prophylaxis: Prophylactic Lovenox   Code Status: Full code  Family Communication: None at  bedside  Disposition Plan: Remain inpatient-but will plan on Home health in the next 1-2 days.   Antimicrobial agents: Anti-infectives    Start     Dose/Rate Route Frequency Ordered Stop   08/17/16 1600  ceFAZolin (ANCEF) IVPB 2g/100 mL premix     2 g 200 mL/hr over 30 Minutes Intravenous Every 6 hours 08/17/16 1220 08/17/16 2232   08/17/16 0800  ceFAZolin (ANCEF) IVPB 2g/100 mL premix     2 g 200 mL/hr over 30 Minutes Intravenous To ShortStay Surgical 08/17/16 0741 08/17/16 1025      Procedures: INTRAMEDULLARY (IM) NAIL RIGHT HIP 2/8>>  CONSULTS:  orthopedic surgery  Time spent: 25- minutes-Greater than 50% of this time was spent in counseling, explanation of diagnosis, planning of further management, and coordination of care.  MEDICATIONS: Scheduled Meds: . amLODipine  10 mg Oral Daily  . chlordiazePOXIDE  10 mg Oral BID  . enoxaparin (LOVENOX) injection  40 mg Subcutaneous Q24H  . feeding supplement (ENSURE ENLIVE)  237 mL Oral BID BM  . FLUoxetine  40 mg Oral Daily  . folic acid  1 mg Oral Daily  . lisinopril  40 mg Oral Daily  . metoprolol  100 mg Oral BID  . multivitamin with minerals  1 tablet Oral Daily  . pantoprazole  80 mg Oral Daily  . thiamine  100 mg Oral Daily   Continuous Infusions:  PRN Meds:.acetaminophen **OR** acetaminophen, HYDROcodone-acetaminophen, LORazepam **OR** LORazepam, menthol-cetylpyridinium **OR** phenol, metoCLOPramide **OR** metoCLOPramide (REGLAN) injection, morphine injection, traZODone   PHYSICAL EXAM: Vital signs: Vitals:   08/18/16 0852 08/18/16 1700 08/18/16 2009 08/19/16 0544  BP: 118/66 116/85 126/76 124/68  Pulse: 79 85 67 64  Resp:  16 16  Temp:  98.5 F (36.9 C) 98.1 F (36.7 C) 98.3 F (36.8 C)  TempSrc:  Oral Oral Oral  SpO2:  94% 98% 93%  Weight:      Height:       Filed Weights   08/16/16 1811  Weight: 80.7 kg (178 lb)   Body mass index is 25.54 kg/m.   General appearance :Awake, alert, not in any  distress.  Eyes:, pupils equally reactive to light and accomodation,no scleral icterus. HEENT: Atraumatic and Normocephalic Neck: supple, no JVD. No cervical lymphadenopathy.  Resp:Good air entry bilaterally, no added sounds  CVS: S1 S2 regular, no murmurs.  GI: Bowel sounds present, Non tender and not distended with no gaurding, rigidity or rebound.No organomegaly Extremities: B/L Lower Ext shows no edema, both legs are warm to touch Neurology:  speech clear,Non focal, sensation is grossly intact.Minimally tremulous Psychiatric: Normal judgment and insight. Alert and oriented x 3.  Musculoskeletal:No digital cyanosis Skin:No Rash, warm and dry Wounds:N/A  I have personally reviewed following labs and imaging studies  LABORATORY DATA: CBC:  Recent Labs Lab 08/17/16 0001 08/18/16 0415  WBC 7.5 9.5  HGB 12.6* 11.1*  HCT 36.4* 32.7*  MCV 97.1 98.2  PLT 200 932    Basic Metabolic Panel:  Recent Labs Lab 08/17/16 0001 08/18/16 0415 08/19/16 0804  NA 129* 128* 132*  K 3.9 4.2 3.6  CL 94* 91* 94*  CO2 '25 27 29  '$ GLUCOSE 92 112* 111*  BUN 5* 7 10  CREATININE 0.91 0.74 0.81  CALCIUM 8.4* 8.7* 8.5*  MG  --  1.7  --   PHOS  --  2.3*  --     GFR: Estimated Creatinine Clearance: 102.6 mL/min (by C-G formula based on SCr of 0.81 mg/dL).  Liver Function Tests:  Recent Labs Lab 08/17/16 0001  AST 40  ALT 34  ALKPHOS 51  BILITOT 0.5  PROT 6.1*  ALBUMIN 3.1*   No results for input(s): LIPASE, AMYLASE in the last 168 hours. No results for input(s): AMMONIA in the last 168 hours.  Coagulation Profile:  Recent Labs Lab 08/17/16 0001  INR 0.98    Cardiac Enzymes: No results for input(s): CKTOTAL, CKMB, CKMBINDEX, TROPONINI in the last 168 hours.  BNP (last 3 results) No results for input(s): PROBNP in the last 8760 hours.  HbA1C: No results for input(s): HGBA1C in the last 72 hours.  CBG: No results for input(s): GLUCAP in the last 168 hours.  Lipid  Profile: No results for input(s): CHOL, HDL, LDLCALC, TRIG, CHOLHDL, LDLDIRECT in the last 72 hours.  Thyroid Function Tests: No results for input(s): TSH, T4TOTAL, FREET4, T3FREE, THYROIDAB in the last 72 hours.  Anemia Panel: No results for input(s): VITAMINB12, FOLATE, FERRITIN, TIBC, IRON, RETICCTPCT in the last 72 hours.  Urine analysis:    Component Value Date/Time   COLORURINE YELLOW 08/17/2016 [place] 08/17/2016 0248   LABSPEC 1.012 08/17/2016 0248   PHURINE 5.0 08/17/2016 0248   GLUCOSEU NEGATIVE 08/17/2016 0248   HGBUR NEGATIVE 08/17/2016 0248   BILIRUBINUR NEGATIVE 08/17/2016 0248   KETONESUR NEGATIVE 08/17/2016 0248   PROTEINUR NEGATIVE 08/17/2016 0248   UROBILINOGEN 0.2 09/26/2013 1457   NITRITE NEGATIVE 08/17/2016 0248   LEUKOCYTESUR NEGATIVE 08/17/2016 0248    Sepsis Labs: Lactic Acid, Venous    Component Value Date/Time   LATICACIDVEN 0.9 09/06/2013 2251    MICROBIOLOGY: Recent Results (from the past 240 hour(s))  Surgical pcr screen  Status: None   Collection Time: 08/17/16  2:31 AM  Result Value Ref Range Status   MRSA, PCR NEGATIVE NEGATIVE Final   Staphylococcus aureus NEGATIVE NEGATIVE Final    Comment:        The Xpert SA Assay (FDA approved for NASAL specimens in patients over 67 years of age), is one component of a comprehensive surveillance program.  Test performance has been validated by [facility] for patients greater than or equal to 97 year old. It is not intended to diagnose infection nor to guide or monitor treatment.     RADIOLOGY STUDIES/RESULTS: Dg Chest 1 View  Result Date: 08/16/2016 CLINICAL DATA:  Patient fell resulting in right hip fracture. Preop. EXAM: CHEST 1 VIEW COMPARISON:  07/23/2015 CT chest FINDINGS: Chronic T8 compression fracture with dextroconvex scoliosis of the lower thoracic spine is stable. Heart is normal in size. There is no mediastinal hematoma. Lungs are clear. IMPRESSION: Chronic  T8 compression with dextroscoliosis of the lower thoracic spine. No acute cardiopulmonary abnormality. Electronically Signed   By: Ashley Royalty M.D.   On: 08/16/2016 20:10   Ct Head Wo Contrast  Result Date: 08/16/2016 CLINICAL DATA:  Neck pain after fall today EXAM: CT HEAD WITHOUT CONTRAST CT CERVICAL SPINE WITHOUT CONTRAST TECHNIQUE: Multidetector CT imaging of the head and cervical spine was performed following the standard protocol without intravenous contrast. Multiplanar CT image reconstructions of the cervical spine were also generated. COMPARISON:  02/18/2015, MRI 09/02/2013 FINDINGS: CT HEAD FINDINGS Brain: No acute territorial infarction or intracranial hemorrhage is visualized. No focal mass, mass effect or midline shift. Evidence of old lacunar infarct in the left posterior basal ganglia with extension to the periventricular white matter. The ventricles are stable in size. Minimal periventricular white matter hypodensity consistent with small vessel change. Mild to moderate atrophy. Vascular: No hyperdense vessels.  Carotid artery calcifications. Skull: Normal. Negative for fracture or focal lesion. Sinuses/Orbits: Mucosal thickening in the ethmoid sinuses. No acute orbital abnormality. Other: None CT CERVICAL SPINE FINDINGS Alignment: No subluxation.  Facet alignment is maintained. Skull base and vertebrae: Craniovertebral junction appears intact. Vertebral body heights are normal. There is no fracture identified. Soft tissues and spinal canal: No prevertebral fluid or swelling. No visible canal hematoma. Disc levels: Multilevel degenerative disc disease, moderate at C5-C6 and C6-C7. Multilevel hypertrophic facet arthropathy. Multiple levels of mild bilateral foraminal stenosis involving the mid cervical spine. Upper chest: Mild emphysematous disease at the apices. Nodular density in the apex of the right lung is suspected to be secondary to pulmonary scarring. Thyroid normal. Other: None IMPRESSION:  1. No definite CT evidence for acute intracranial abnormality. 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 2. No acute fracture or malalignment of the cervical spine. Electronically Signed   By: Donavan Foil M.D.   On: 08/16/2016 19:53   Ct Cervical Spine Wo Contrast  Result Date: 08/16/2016 CLINICAL DATA:  Neck pain after fall today EXAM: CT HEAD WITHOUT CONTRAST CT CERVICAL SPINE WITHOUT CONTRAST TECHNIQUE: Multidetector CT imaging of the head and cervical spine was performed following the standard protocol without intravenous contrast. Multiplanar CT image reconstructions of the cervical spine were also generated. COMPARISON:  02/18/2015, MRI 09/02/2013 FINDINGS: CT HEAD FINDINGS Brain: No acute territorial infarction or intracranial hemorrhage is visualized. No focal mass, mass effect or midline shift. Evidence of old lacunar infarct in the left posterior basal ganglia with extension to the periventricular white matter. The ventricles are stable in size. Minimal periventricular white matter  hypodensity consistent with small vessel change. Mild to moderate atrophy. Vascular: No hyperdense vessels.  Carotid artery calcifications. Skull: Normal. Negative for fracture or focal lesion. Sinuses/Orbits: Mucosal thickening in the ethmoid sinuses. No acute orbital abnormality. Other: None CT CERVICAL SPINE FINDINGS Alignment: No subluxation.  Facet alignment is maintained. Skull base and vertebrae: Craniovertebral junction appears intact. Vertebral body heights are normal. There is no fracture identified. Soft tissues and spinal canal: No prevertebral fluid or swelling. No visible canal hematoma. Disc levels: Multilevel degenerative disc disease, moderate at C5-C6 and C6-C7. Multilevel hypertrophic facet arthropathy. Multiple levels of mild bilateral foraminal stenosis involving the mid cervical spine. Upper chest: Mild emphysematous disease at the apices. Nodular density in the apex of the right  lung is suspected to be secondary to pulmonary scarring. Thyroid normal. Other: None IMPRESSION: 1. No definite CT evidence for acute intracranial abnormality. 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 2. No acute fracture or malalignment of the cervical spine. Electronically Signed   By: Donavan Foil M.D.   On: 08/16/2016 19:53   Dg C-arm 1-60 Min  Result Date: 08/17/2016 CLINICAL DATA:  I am nail placement in the right femur EXAM: DG C-ARM 61-120 MIN; RIGHT FEMUR 2 VIEWS COMPARISON:  None. FLUOROSCOPY TIME:  1 minutes 1 second FINDINGS: Interval ORIF of a right basicervical proximal femoral fracture with an intramedullary nail and interlocking cannulated femoral neck screw without failure or complication. Anatomic alignment. IMPRESSION: Interval ORIF of a right basicervical proximal femoral fracture. Electronically Signed   By: Kathreen Devoid   On: 08/17/2016 11:21   Dg Hip Unilat W Or Wo Pelvis 2-3 Views Right  Result Date: 08/16/2016 CLINICAL DATA:  Patient fell.  Hip pain. EXAM: DG HIP (WITH OR WITHOUT PELVIS) 2-3V RIGHT COMPARISON:  None. FINDINGS: Acute, closed, varus angulated basicervical fracture of the proximal right femur. There is a sagittally oriented fracture as well at the base of the right greater trochanter without avulsion nor displacement. Femoral heads are seated within their hip joints with slight joint space narrowing bilaterally. The bony pelvis appears intact. IMPRESSION: Acute, closed, basicervical fracture of the right femur with varus angulation. Incomplete nondisplaced fracture at the base of the greater trochanter is also seen right. Electronically Signed   By: Ashley Royalty M.D.   On: 08/16/2016 20:07   Dg Femur, Min 2 Views Right  Result Date: 08/17/2016 CLINICAL DATA:  I am nail placement in the right femur EXAM: DG C-ARM 61-120 MIN; RIGHT FEMUR 2 VIEWS COMPARISON:  None. FLUOROSCOPY TIME:  1 minutes 1 second FINDINGS: Interval ORIF of a right basicervical  proximal femoral fracture with an intramedullary nail and interlocking cannulated femoral neck screw without failure or complication. Anatomic alignment. IMPRESSION: Interval ORIF of a right basicervical proximal femoral fracture. Electronically Signed   By: Kathreen Devoid   On: 08/17/2016 11:21     LOS: 3 days   Oren Binet, MD  [facility] Pager:[phone]  If 7PM-7AM, please contact night-coverage [email or web address] Password [facility] 08/19/2016, 9:36 AM

## 2016-08-19 NOTE — Progress Notes (Signed)
Subjective: 2 Days Post-Op Procedure(s) (LRB): INTRAMEDULLARY (IM) NAIL RIGHT HIP (Right) Patient reports pain as moderate. Ambulated 75 feet with physical therapy today.   Objective: Vital signs in last 24 hours: Temp:  [98.1 F (36.7 C)-98.5 F (36.9 C)] 98.3 F (36.8 C) (02/10 0544) Pulse Rate:  [64-85] 64 (02/10 0544) Resp:  [16] 16 (02/10 0544) BP: (116-126)/(68-85) 124/68 (02/10 0544) SpO2:  [93 %-98 %] 93 % (02/10 0544)  Intake/Output from previous day: 02/09 0701 - 02/10 0700 In: 340 [P.O.:340] Out: 1325 [Urine:1325] Intake/Output this shift: Total I/O In: 480 [P.O.:480] Out: 100 [Urine:100]   Recent Labs  08/17/16 0001 08/18/16 0415  HGB 12.6* 11.1*    Recent Labs  08/17/16 0001 08/18/16 0415  WBC 7.5 9.5  RBC 3.75* 3.33*  HCT 36.4* 32.7*  PLT 200 185    Recent Labs  08/18/16 0415 08/19/16 0804  NA 128* 132*  K 4.2 3.6  CL 91* 94*  CO2 27 29  BUN 7 10  CREATININE 0.74 0.81  GLUCOSE 112* 111*  CALCIUM 8.7* 8.5*    Recent Labs  08/17/16 0001  INR 0.98  Patient is alert and oriented. Right hip exam: Right leg dressings are benign.  His calf is soft and nontender. He moves his left foot without difficulty    Assessment/Plan: 2 Days Post-Op Procedure(s) (LRB): INTRAMEDULLARY (IM) NAIL RIGHT HIP (Right)  Plan: Weight-bear as tolerated on right. Continue Lovenox for DVT prophylaxis While in hospital. Would probably discharge home on Xarelto which she has taken the past.  He will need anticoagulation 1 month postop.  Probable discharge home tomorrow with home health PT.  Erlene Senters 08/19/2016, 11:02 AM

## 2016-08-20 DIAGNOSIS — I10 Essential (primary) hypertension: Secondary | ICD-10-CM

## 2016-08-20 MED ORDER — RIVAROXABAN 10 MG PO TABS: 10.0000 mg | ORAL_TABLET | Freq: Every day | ORAL | Status: DC

## 2016-08-20 MED ORDER — THIAMINE HCL 100 MG PO TABS: 100.0000 mg | ORAL_TABLET | Freq: Every day | ORAL | 0 refills | Status: DC

## 2016-08-20 MED ORDER — ADULT MULTIVITAMIN W/MINERALS CH: 1.0000 | ORAL_TABLET | Freq: Every day | ORAL | 0 refills | Status: DC

## 2016-08-20 MED ORDER — RIVAROXABAN 10 MG PO TABS: 10.0000 mg | ORAL_TABLET | Freq: Every day | ORAL | 0 refills | Status: DC

## 2016-08-20 MED ORDER — ENSURE ENLIVE PO LIQD: 237.0000 mL | Freq: Two times a day (BID) | ORAL | 0 refills | Status: DC

## 2016-08-20 NOTE — Progress Notes (Signed)
Physical Therapy Treatment Patient Details Name: Tanner [name] MRN: [id number] DOB: Aug 21, 1955 Today's Date: 08/20/2016    History of Present Illness Pt is a 61 y/o male s/p IM nail Right hip after "I got drunk and fell". Pt  has a past medical history including Alcohol abuse; Alcoholic hepatitis; Allergy; Anxiety; Arthritis; C. difficile colitis; Depression; DVT (2015); Hypertension; Ileus (09/2013); Prostate cancer (05/22/13); TIA (transient ischemic attack) (08/2013). Pt states that his right side is only 85% after TIA. Pt has a past surgical history that includes Hand surgery (Right); ORIF ankle fracture (Right, 12/10/2012); Robot assisted laparoscopic radical prostatectomy (08/29/2013); and Lymphadenectomy (08/29/2013).    PT Comments    Pt is making steady progress with mobility. No dizziness with gait today, did have some nausea toward the end of session. RN notified and to address. Acute PT to continue during pt's hospital stay.  Follow Up Recommendations  Home health PT;Supervision for mobility/OOB     Equipment Recommendations  None recommended by PT    Precautions / Restrictions Precautions Precautions: Fall Precaution Comments: History of falls related to EtOH abuse Restrictions RLE Weight Bearing: Weight bearing as tolerated    Mobility  Bed Mobility Overal bed mobility: Modified Independent Bed Mobility: Supine to Sit     Supine to sit: Modified independent (Device/Increase time)     General bed mobility comments: bed flat, no rails. pt used his blue leg lifter to move right LE to edge of bed and off edge of bed. increased time needed.  Transfers Overall transfer level: Needs assistance Equipment used: Rolling walker (2 wheeled) Transfers: Sit to/from Stand Sit to Stand: Supervision         General transfer comment: demo'd good/safe hand placement; reminder cue for right LE placement before sitting to recliner  Ambulation/Gait Ambulation/Gait assistance:  Supervision Ambulation Distance (Feet): 200 Feet Assistive device: Rolling walker (2 wheeled) Gait Pattern/deviations: Step-through pattern;Decreased stride length;Trunk flexed;Antalgic Gait velocity: decreased Gait velocity interpretation: Below normal speed for age/gender General Gait Details: cues on posture and step length with gait. pt with heavy UE reliance on walker with gait       Balance   Sitting-balance support: Feet supported Sitting balance-Leahy Scale: Good Sitting balance - Comments: pt able to don shirt and pull up pants to knees once they were placed over bil feet with supervision only   Standing balance support: During functional activity;No upper extremity supported Standing balance-Leahy Scale: Good Standing balance comment: pt able to stand with out UE support to pull up sweat pants with supervision only         Cognition Arousal/Alertness: Awake/alert Behavior During Therapy: WFL for tasks assessed/performed Overall Cognitive Status: Within Functional Limits for tasks assessed         Exercises General Exercises - Lower Extremity Ankle Circles/Pumps: AROM;Both;10 reps;Supine Quad Sets: AROM;Strengthening;Both;10 reps;Supine Hip Flexion/Marching: AROM;Strengthening;Right;10 reps;Standing;Limitations Hip Flexion/Marching Limitations: with RW support for balance     Pertinent Vitals/Pain Pain Assessment: 0-10 Pain Score: 6  Pain Location: R hip Pain Descriptors / Indicators: Aching;Sore;Operative site guarding Pain Intervention(s): Limited activity within patient's tolerance;Monitored during session;Premedicated before session;Repositioned     PT Goals (current goals can now be found in the care plan section) Acute Rehab PT Goals Patient Stated Goal: to go home PT Goal Formulation: With patient Time For Goal Achievement: 08/25/16 Potential to Achieve Goals: Good Progress towards PT goals: Progressing toward goals    Frequency    Min  5X/week      PT Plan Current plan remains  appropriate    End of Session Equipment Utilized During Treatment: Gait belt Activity Tolerance: Patient tolerated treatment well Patient left: in chair;with call bell/phone within reach;with family/visitor present     Time: 0950-1016 PT Time Calculation (min) (ACUTE ONLY): 26 min  Charges:  $Gait Training: 8-22 mins $Therapeutic Activity: 8-22 mins           Willow Ora 08/20/2016, 10:22 AM   Willow Ora, PTA, CLT Acute Rehab Services [phone] 08/20/16, 10:23 AM

## 2016-08-20 NOTE — Progress Notes (Signed)
Subjective: 3 Days Post-Op Procedure(s) (LRB): INTRAMEDULLARY (IM) NAIL RIGHT HIP (Right) Patient reports pain as mild.  Making good progress with physical therapy. Ready for discharge home today. Taking by mouth and voiding okay.Marland Kitchen Positive BM.  Objective: Vital signs in last 24 hours: Temp:  [97.8 F (36.6 C)-98.8 F (37.1 C)] 98.8 F (37.1 C) (02/11 0700) Pulse Rate:  [66-78] 78 (02/11 0831) Resp:  [15-16] 16 (02/11 0700) BP: (115-129)/(66-79) 115/66 (02/11 0831) SpO2:  [94 %-97 %] 97 % (02/11 0700)  Intake/Output from previous day: 02/10 0701 - 02/11 0700 In: 1200 [P.O.:1200] Out: 400 [Urine:400] Intake/Output this shift: No intake/output data recorded.   Recent Labs  08/18/16 0415  HGB 11.1*    Recent Labs  08/18/16 0415  WBC 9.5  RBC 3.33*  HCT 32.7*  PLT 185    Recent Labs  08/18/16 0415 08/19/16 0804  NA 128* 132*  K 4.2 3.6  CL 91* 94*  CO2 27 29  BUN 7 10  CREATININE 0.74 0.81  GLUCOSE 112* 111*  CALCIUM 8.7* 8.5*   No results for input(s): LABPT, INR in the last 72 hours. Right hip/leg exam:  Neurovascular intact Sensation intact distally Intact pulses distally Dorsiflexion/Plantar flexion intact Incision: dressing C/D/I Compartment soft  Assessment/Plan: 3 Days Post-Op Procedure(s) (LRB): INTRAMEDULLARY (IM) NAIL RIGHT HIP (Right) Plan: Xarelto 10 mg daily 1 month postop for DVT prophylaxis. Weight-bear as tolerated on right. With walker.  may shower with Aquacel dressings in place. Up with therapy Discharge home with home health Follow-up Dr. Tamera Punt in 2 weeks.  Trevell Pariseau G 08/20/2016, 10:37 AM

## 2016-08-20 NOTE — Discharge Summary (Signed)
PATIENT DETAILS Name: Tanner [name] Age: 61 y.o. Sex: male Date of Birth: 04-Aug-1955 MRN: [id number]. Admitting Physician: Tanner Bongo, MD MPN:TIRWER, Tanner Cruise, [name]  Admit Date: 08/16/2016 Discharge date: 08/20/2016  Recommendations for Outpatient Follow-up:  1. Follow up with PCP in 1-2 weeks 2. Please obtain BMP/CBC in one week 3. Started on Xarelto for venous thromboembolism prophylaxis following right hip surgery-needs to be on it for 30 days from 2/8 4. Please ensure follow-up with orthopedic surgery 5. Continue outpatient counseling regarding importance of avoiding alcohol use   Admitted From:  Home  Disposition: Home with home health services   Home Health: Yes  Equipment/Devices: None  Discharge Condition: Stable  CODE STATUS: FULL CODE  Diet recommendation:  Heart Healthy   Brief Summary: See H&P, Labs, Consult and Test reports for all details in brief,Patient is a 61 y.o. male with past medical history of chronic alcoholism, prostate cancer, prior CVA, prior history of DVT (anticoagulation discontinued February 2017), hypertension admitted with a right hip fracture following a mechanical fall. Orthopedic consulted, patient underwent operative repair on 2/8. See below for further details  Brief Hospital Course: Right hip fracture: Secondary to a mechanical fall. Underwent operative repair on 2/8. Postop course was uncomplicated. Recommendations from orthopedics are to continue with weightbearing as tolerated, and to use Xarelto for venous thromboembolism prophylaxis. PT evaluation completed, recommendations are for home health services. Patient has been prescribed narcotics by the orthopedic service-have subsequently spoken with the patient, and with his daughter (after patient's consent) the phone-I have explained to him the dangers of polypharmacy-especially combining alcohol with narcotics and benzodiazepine- can lead to devastating outcomes-which have both  life threatening and life disabling potential.He claims understanding, and his daughter is also aware. Family will keep a close eye on the patient.  Hyponatremia: Likely secondary to be a beer potomania. Sodium much improved at 132 . Doubt any further workup required. Follow in the outpatient setting  Alcohol withdrawal:  Resolved. Librium will be tapered off today. Has completed Ativan per protocol.he is completely awake and alert, and does not have any tremors this morning..  EtOH use: Counseled, claims that he is tried to quit in the past and has only gone around 3-4 months without using alcohol before he relapses.Have asked RN to ensure social work evaluation prior to discharge.  History of DVT: On Lovenox prophylaxis while inpatient-orthopedic service now recommending transitioning to Xarelto. I have explained to the patient a fall risk-and the dangers of polypharmacy while on anticoagulation. His daughter was also made aware over the phone.   Hypertension: Controlled, continue metoprolol, amlodipine, and lisinopril  Tobacco abuse: Counseled.  History of prostate cancer: Outpatient follow-up with PCP/primary oncologist and urologist  Procedures/Studies: INTRAMEDULLARY (IM) NAIL RIGHT HIP 2/8>>  Discharge Diagnoses:  Principal Problem:   Hip fracture ([place]) Active Problems:   Hyponatremia   Protein-calorie malnutrition ([place])   Thoracic compression fracture, closed, initial encounter ([place])   Alcohol dependence ([place])   Tobacco dependence   Osteoporosis   Discharge Instructions:  Activity:  WBAT   Discharge Instructions    Call MD for:  redness, tenderness, or signs of infection (pain, swelling, redness, odor or green/yellow discharge around incision site)    Complete by:  As directed    Diet - low sodium heart healthy    Complete by:  As directed    Discharge instructions    Complete by:  As directed    Follow with Primary MD  Tanner Po, [name] in 1  week  Follow with Orthopedic's-Dr Tanner [name] in 2 weeks  STOP DRINKING ALCOHOL  You have been placed on Xarelto for prevention of blood clots-take it for 27 more days (30 days total from day of surgery)    Please get a complete blood count and chemistry panel checked by your Primary MD at your next visit, and again as instructed by your Primary MD.  Get Medicines reviewed and adjusted: Please take all your medications with you for your next visit with your Primary MD  Laboratory/radiological data: Please request your Primary MD to go over all hospital tests and procedure/radiological results at the follow up, please ask your Primary MD to get all Hospital records sent to his/her office.  In some cases, they will be blood work, cultures and biopsy results pending at the time of your discharge. Please request that your primary care M.D. follows up on these results.  Also Note the following: If you experience worsening of your admission symptoms, develop shortness of breath, life threatening emergency, suicidal or homicidal thoughts you must seek medical attention immediately by calling 911 or calling your MD immediately  if symptoms less severe.  You must read complete instructions/literature along with all the possible adverse reactions/side effects for all the Medicines you take and that have been prescribed to you. Take any new Medicines after you have completely understood and accpet all the possible adverse reactions/side effects.   Do not drive when taking Pain medications or sleeping medications (Benzodaizepines).  Please be very careful-drinking alcohol and consuming narcotics and benzodiazepines can cause excessive sedation resulting life threatening and life disabling effects.  Do not take more than prescribed Pain, Sleep and Anxiety Medications. It is not advisable to combine anxiety,sleep and pain medications without talking with your primary care practitioner  Special  Instructions: If you have smoked or chewed Tobacco  in the last 2 yrs please stop smoking, stop any regular Alcohol  and or any Recreational drug use.  Wear Seat belts while driving.  Please note: You were cared for by a hospitalist during your hospital stay. Once you are discharged, your primary care physician will handle any further medical issues. Please note that NO REFILLS for any discharge medications will be authorized once you are discharged, as it is imperative that you return to your primary care physician (or establish a relationship with a primary care physician if you do not have one) for your post hospital discharge needs so that they can reassess your need for medications and monitor your lab values.   Weight bearing as tolerated    Complete by:  As directed      Allergies as of 08/20/2016      Reactions   Dilaudid [hydromorphone Hcl] Nausea And Vomiting   Pre pt history   Oxycodone Nausea And Vomiting   Procaine Hcl Other (See Comments)   Patient states he is allergic to novicaine   Zofran [ondansetron] Nausea And Vomiting   Sulfa Antibiotics Rash      Medication List    TAKE these medications   amLODipine 10 MG tablet Commonly known as:  NORVASC Take 1 tablet (10 mg total) by mouth daily.   clonazePAM 0.5 MG tablet Commonly known as:  KLONOPIN TAKE 1 TABLET BY MOUTH THREE TIMES DAILYAS NEEDED FOR ANXIETY   docusate sodium 100 MG capsule Commonly known as:  COLACE Take 1 capsule (100 mg total) by mouth 3 (three) times daily as needed.   feeding supplement (ENSURE ENLIVE) Liqd Take 237  mLs by mouth 2 (two) times daily between meals.   FLUoxetine 40 MG capsule Commonly known as:  PROZAC TAKE 1 CAPSULE BY MOUTH DAILY   lisinopril 40 MG tablet Commonly known as:  PRINIVIL,ZESTRIL TAKE ONE (1) TABLET BY MOUTH EVERY DAY   metoprolol 100 MG tablet Commonly known as:  LOPRESSOR Take 1 tablet (100 mg total) by mouth 2 (two) times daily.   multivitamin with  minerals Tabs tablet Take 1 tablet by mouth daily. Start taking on:  08/21/2016   omeprazole 40 MG capsule Commonly known as:  PRILOSEC TAKE ONE CAPSULE BY MOUTH TWICE A DAY   oxyCODONE-acetaminophen 5-325 MG tablet Commonly known as:  ROXICET Take 1-2 tablets by mouth every 4 (four) hours as needed for severe pain.   rivaroxaban 10 MG Tabs tablet Commonly known as:  XARELTO Take 1 tablet (10 mg total) by mouth at bedtime. Start today at 2200 hours   simvastatin 20 MG tablet Commonly known as:  ZOCOR TAKE ONE (1) TABLET BY MOUTH EVERY DAY   thiamine 100 MG tablet Take 1 tablet (100 mg total) by mouth daily. Start taking on:  08/21/2016   traZODone 50 MG tablet Commonly known as:  DESYREL Take 50 mg by mouth at bedtime as needed for sleep.      Follow-up Information    Tanner Po, [name]. Schedule an appointment as soon as possible for a visit in 1 week(s).   Specialty:  Family Medicine Contact information: [place] [address] [phone]        Nita Sells, MD. Schedule an appointment as soon as possible for a visit in 2 week(s).   Specialty:  Orthopedic Surgery Contact information: [place] [address] [phone]          Allergies  Allergen Reactions  . Dilaudid [Hydromorphone Hcl] Nausea And Vomiting    Pre pt history  . Oxycodone Nausea And Vomiting  . Procaine Hcl Other (See Comments)    Patient states he is allergic to novicaine  . Zofran [Ondansetron] Nausea And Vomiting  . Sulfa Antibiotics Rash    Consultations:   orthopedic surgery  Other Procedures/Studies: Dg Chest 1 View  Result Date: 08/16/2016 CLINICAL DATA:  Patient fell resulting in right hip fracture. Preop. EXAM: CHEST 1 VIEW COMPARISON:  07/23/2015 CT chest FINDINGS: Chronic T8 compression fracture with dextroconvex scoliosis of the lower thoracic spine is stable. Heart is normal in size. There is no mediastinal  hematoma. Lungs are clear. IMPRESSION: Chronic T8 compression with dextroscoliosis of the lower thoracic spine. No acute cardiopulmonary abnormality. Electronically Signed   By: Ashley Royalty M.D.   On: 08/16/2016 20:10   Ct Head Wo Contrast  Result Date: 08/16/2016 CLINICAL DATA:  Neck pain after fall today EXAM: CT HEAD WITHOUT CONTRAST CT CERVICAL SPINE WITHOUT CONTRAST TECHNIQUE: Multidetector CT imaging of the head and cervical spine was performed following the standard protocol without intravenous contrast. Multiplanar CT image reconstructions of the cervical spine were also generated. COMPARISON:  02/18/2015, MRI 09/02/2013 FINDINGS: CT HEAD FINDINGS Brain: No acute territorial infarction or intracranial hemorrhage is visualized. No focal mass, mass effect or midline shift. Evidence of old lacunar infarct in the left posterior basal ganglia with extension to the periventricular white matter. The ventricles are stable in size. Minimal periventricular white matter hypodensity consistent with small vessel change. Mild to moderate atrophy. Vascular: No hyperdense vessels.  Carotid artery calcifications. Skull: Normal. Negative for fracture or focal lesion. Sinuses/Orbits:  Mucosal thickening in the ethmoid sinuses. No acute orbital abnormality. Other: None CT CERVICAL SPINE FINDINGS Alignment: No subluxation.  Facet alignment is maintained. Skull base and vertebrae: Craniovertebral junction appears intact. Vertebral body heights are normal. There is no fracture identified. Soft tissues and spinal canal: No prevertebral fluid or swelling. No visible canal hematoma. Disc levels: Multilevel degenerative disc disease, moderate at C5-C6 and C6-C7. Multilevel hypertrophic facet arthropathy. Multiple levels of mild bilateral foraminal stenosis involving the mid cervical spine. Upper chest: Mild emphysematous disease at the apices. Nodular density in the apex of the right lung is suspected to be secondary to pulmonary  scarring. Thyroid normal. Other: None IMPRESSION: 1. No definite CT evidence for acute intracranial abnormality. 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 2. No acute fracture or malalignment of the cervical spine. Electronically Signed   By: Donavan Foil M.D.   On: 08/16/2016 19:53   Ct Cervical Spine Wo Contrast  Result Date: 08/16/2016 CLINICAL DATA:  Neck pain after fall today EXAM: CT HEAD WITHOUT CONTRAST CT CERVICAL SPINE WITHOUT CONTRAST TECHNIQUE: Multidetector CT imaging of the head and cervical spine was performed following the standard protocol without intravenous contrast. Multiplanar CT image reconstructions of the cervical spine were also generated. COMPARISON:  02/18/2015, MRI 09/02/2013 FINDINGS: CT HEAD FINDINGS Brain: No acute territorial infarction or intracranial hemorrhage is visualized. No focal mass, mass effect or midline shift. Evidence of old lacunar infarct in the left posterior basal ganglia with extension to the periventricular white matter. The ventricles are stable in size. Minimal periventricular white matter hypodensity consistent with small vessel change. Mild to moderate atrophy. Vascular: No hyperdense vessels.  Carotid artery calcifications. Skull: Normal. Negative for fracture or focal lesion. Sinuses/Orbits: Mucosal thickening in the ethmoid sinuses. No acute orbital abnormality. Other: None CT CERVICAL SPINE FINDINGS Alignment: No subluxation.  Facet alignment is maintained. Skull base and vertebrae: Craniovertebral junction appears intact. Vertebral body heights are normal. There is no fracture identified. Soft tissues and spinal canal: No prevertebral fluid or swelling. No visible canal hematoma. Disc levels: Multilevel degenerative disc disease, moderate at C5-C6 and C6-C7. Multilevel hypertrophic facet arthropathy. Multiple levels of mild bilateral foraminal stenosis involving the mid cervical spine. Upper chest: Mild emphysematous disease at the  apices. Nodular density in the apex of the right lung is suspected to be secondary to pulmonary scarring. Thyroid normal. Other: None IMPRESSION: 1. No definite CT evidence for acute intracranial abnormality. 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 2. No acute fracture or malalignment of the cervical spine. Electronically Signed   By: Donavan Foil M.D.   On: 08/16/2016 19:53   Dg C-arm 1-60 Min  Result Date: 08/17/2016 CLINICAL DATA:  I am nail placement in the right femur EXAM: DG C-ARM 61-120 MIN; RIGHT FEMUR 2 VIEWS COMPARISON:  None. FLUOROSCOPY TIME:  1 minutes 1 second FINDINGS: Interval ORIF of a right basicervical proximal femoral fracture with an intramedullary nail and interlocking cannulated femoral neck screw without failure or complication. Anatomic alignment. IMPRESSION: Interval ORIF of a right basicervical proximal femoral fracture. Electronically Signed   By: Kathreen Devoid   On: 08/17/2016 11:21   Dg Hip Unilat W Or Wo Pelvis 2-3 Views Right  Result Date: 08/16/2016 CLINICAL DATA:  Patient fell.  Hip pain. EXAM: DG HIP (WITH OR WITHOUT PELVIS) 2-3V RIGHT COMPARISON:  None. FINDINGS: Acute, closed, varus angulated basicervical fracture of the proximal right femur. There is a sagittally oriented fracture as well  at the base of the right greater trochanter without avulsion nor displacement. Femoral heads are seated within their hip joints with slight joint space narrowing bilaterally. The bony pelvis appears intact. IMPRESSION: Acute, closed, basicervical fracture of the right femur with varus angulation. Incomplete nondisplaced fracture at the base of the greater trochanter is also seen right. Electronically Signed   By: Ashley Royalty M.D.   On: 08/16/2016 20:07   Dg Femur, Min 2 Views Right  Result Date: 08/17/2016 CLINICAL DATA:  I am nail placement in the right femur EXAM: DG C-ARM 61-120 MIN; RIGHT FEMUR 2 VIEWS COMPARISON:  None. FLUOROSCOPY TIME:  1 minutes 1 second  FINDINGS: Interval ORIF of a right basicervical proximal femoral fracture with an intramedullary nail and interlocking cannulated femoral neck screw without failure or complication. Anatomic alignment. IMPRESSION: Interval ORIF of a right basicervical proximal femoral fracture. Electronically Signed   By: Kathreen Devoid   On: 08/17/2016 11:21      TODAY-DAY OF DISCHARGE:  Subjective:   Tanner [name] today has no headache,no chest abdominal pain,no new weakness tingling or numbness, feels much better wants to go home today.   Objective:   Blood pressure 115/66, pulse 78, temperature 98.8 F (37.1 C), temperature source Oral, resp. rate 16, height '5\' 10"'$  (1.778 m), weight 80.7 kg (178 lb), SpO2 97 %.  Intake/Output Summary (Last 24 hours) at 08/20/16 0922 Last data filed at 08/19/16 1715  Gross per 24 hour  Intake              960 ml  Output              300 ml  Net              660 ml   Filed Weights   08/16/16 1811  Weight: 80.7 kg (178 lb)    Exam: Awake Alert, Oriented *3, No new F.N deficits, Normal affect [employer].AT,PERRAL Supple Neck,No JVD, No cervical lymphadenopathy appriciated.  Symmetrical Chest wall movement, Good air movement bilaterally, CTAB RRR,No Gallops,Rubs or new Murmurs, No Parasternal Heave +ve B.Sounds, Abd Soft, Non tender, No organomegaly appriciated, No rebound -guarding or rigidity. No Cyanosis, Clubbing or edema, No new Rash or bruise   PERTINENT RADIOLOGIC STUDIES: Dg Chest 1 View  Result Date: 08/16/2016 CLINICAL DATA:  Patient fell resulting in right hip fracture. Preop. EXAM: CHEST 1 VIEW COMPARISON:  07/23/2015 CT chest FINDINGS: Chronic T8 compression fracture with dextroconvex scoliosis of the lower thoracic spine is stable. Heart is normal in size. There is no mediastinal hematoma. Lungs are clear. IMPRESSION: Chronic T8 compression with dextroscoliosis of the lower thoracic spine. No acute cardiopulmonary abnormality. Electronically Signed   By:  Ashley Royalty M.D.   On: 08/16/2016 20:10   Ct Head Wo Contrast  Result Date: 08/16/2016 CLINICAL DATA:  Neck pain after fall today EXAM: CT HEAD WITHOUT CONTRAST CT CERVICAL SPINE WITHOUT CONTRAST TECHNIQUE: Multidetector CT imaging of the head and cervical spine was performed following the standard protocol without intravenous contrast. Multiplanar CT image reconstructions of the cervical spine were also generated. COMPARISON:  02/18/2015, MRI 09/02/2013 FINDINGS: CT HEAD FINDINGS Brain: No acute territorial infarction or intracranial hemorrhage is visualized. No focal mass, mass effect or midline shift. Evidence of old lacunar infarct in the left posterior basal ganglia with extension to the periventricular white matter. The ventricles are stable in size. Minimal periventricular white matter hypodensity consistent with small vessel change. Mild to moderate atrophy. Vascular: No hyperdense vessels.  Carotid  artery calcifications. Skull: Normal. Negative for fracture or focal lesion. Sinuses/Orbits: Mucosal thickening in the ethmoid sinuses. No acute orbital abnormality. Other: None CT CERVICAL SPINE FINDINGS Alignment: No subluxation.  Facet alignment is maintained. Skull base and vertebrae: Craniovertebral junction appears intact. Vertebral body heights are normal. There is no fracture identified. Soft tissues and spinal canal: No prevertebral fluid or swelling. No visible canal hematoma. Disc levels: Multilevel degenerative disc disease, moderate at C5-C6 and C6-C7. Multilevel hypertrophic facet arthropathy. Multiple levels of mild bilateral foraminal stenosis involving the mid cervical spine. Upper chest: Mild emphysematous disease at the apices. Nodular density in the apex of the right lung is suspected to be secondary to pulmonary scarring. Thyroid normal. Other: None IMPRESSION: 1. No definite CT evidence for acute intracranial abnormality. 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 2. No  acute fracture or malalignment of the cervical spine. Electronically Signed   By: Donavan Foil M.D.   On: 08/16/2016 19:53   Ct Cervical Spine Wo Contrast  Result Date: 08/16/2016 CLINICAL DATA:  Neck pain after fall today EXAM: CT HEAD WITHOUT CONTRAST CT CERVICAL SPINE WITHOUT CONTRAST TECHNIQUE: Multidetector CT imaging of the head and cervical spine was performed following the standard protocol without intravenous contrast. Multiplanar CT image reconstructions of the cervical spine were also generated. COMPARISON:  02/18/2015, MRI 09/02/2013 FINDINGS: CT HEAD FINDINGS Brain: No acute territorial infarction or intracranial hemorrhage is visualized. No focal mass, mass effect or midline shift. Evidence of old lacunar infarct in the left posterior basal ganglia with extension to the periventricular white matter. The ventricles are stable in size. Minimal periventricular white matter hypodensity consistent with small vessel change. Mild to moderate atrophy. Vascular: No hyperdense vessels.  Carotid artery calcifications. Skull: Normal. Negative for fracture or focal lesion. Sinuses/Orbits: Mucosal thickening in the ethmoid sinuses. No acute orbital abnormality. Other: None CT CERVICAL SPINE FINDINGS Alignment: No subluxation.  Facet alignment is maintained. Skull base and vertebrae: Craniovertebral junction appears intact. Vertebral body heights are normal. There is no fracture identified. Soft tissues and spinal canal: No prevertebral fluid or swelling. No visible canal hematoma. Disc levels: Multilevel degenerative disc disease, moderate at C5-C6 and C6-C7. Multilevel hypertrophic facet arthropathy. Multiple levels of mild bilateral foraminal stenosis involving the mid cervical spine. Upper chest: Mild emphysematous disease at the apices. Nodular density in the apex of the right lung is suspected to be secondary to pulmonary scarring. Thyroid normal. Other: None IMPRESSION: 1. No definite CT evidence for acute  intracranial abnormality. 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 2. No acute fracture or malalignment of the cervical spine. Electronically Signed   By: Donavan Foil M.D.   On: 08/16/2016 19:53   Dg C-arm 1-60 Min  Result Date: 08/17/2016 CLINICAL DATA:  I am nail placement in the right femur EXAM: DG C-ARM 61-120 MIN; RIGHT FEMUR 2 VIEWS COMPARISON:  None. FLUOROSCOPY TIME:  1 minutes 1 second FINDINGS: Interval ORIF of a right basicervical proximal femoral fracture with an intramedullary nail and interlocking cannulated femoral neck screw without failure or complication. Anatomic alignment. IMPRESSION: Interval ORIF of a right basicervical proximal femoral fracture. Electronically Signed   By: Kathreen Devoid   On: 08/17/2016 11:21   Dg Hip Unilat W Or Wo Pelvis 2-3 Views Right  Result Date: 08/16/2016 CLINICAL DATA:  Patient fell.  Hip pain. EXAM: DG HIP (WITH OR WITHOUT PELVIS) 2-3V RIGHT COMPARISON:  None. FINDINGS: Acute, closed, varus angulated basicervical fracture of the  proximal right femur. There is a sagittally oriented fracture as well at the base of the right greater trochanter without avulsion nor displacement. Femoral heads are seated within their hip joints with slight joint space narrowing bilaterally. The bony pelvis appears intact. IMPRESSION: Acute, closed, basicervical fracture of the right femur with varus angulation. Incomplete nondisplaced fracture at the base of the greater trochanter is also seen right. Electronically Signed   By: Ashley Royalty M.D.   On: 08/16/2016 20:07   Dg Femur, Min 2 Views Right  Result Date: 08/17/2016 CLINICAL DATA:  I am nail placement in the right femur EXAM: DG C-ARM 61-120 MIN; RIGHT FEMUR 2 VIEWS COMPARISON:  None. FLUOROSCOPY TIME:  1 minutes 1 second FINDINGS: Interval ORIF of a right basicervical proximal femoral fracture with an intramedullary nail and interlocking cannulated femoral neck screw without failure or complication.  Anatomic alignment. IMPRESSION: Interval ORIF of a right basicervical proximal femoral fracture. Electronically Signed   By: Kathreen Devoid   On: 08/17/2016 11:21     PERTINENT LAB RESULTS: CBC:  Recent Labs  08/18/16 0415  WBC 9.5  HGB 11.1*  HCT 32.7*  PLT 185   CMET CMP     Component Value Date/Time   NA 132 (L) 08/19/2016 0804   K 3.6 08/19/2016 0804   CL 94 (L) 08/19/2016 0804   CO2 29 08/19/2016 0804   GLUCOSE 111 (H) 08/19/2016 0804   BUN 10 08/19/2016 0804   CREATININE 0.81 08/19/2016 0804   CALCIUM 8.5 (L) 08/19/2016 0804   PROT 6.1 (L) 08/17/2016 0001   ALBUMIN 3.1 (L) 08/17/2016 0001   AST 40 08/17/2016 0001   ALT 34 08/17/2016 0001   ALKPHOS 51 08/17/2016 0001   BILITOT 0.5 08/17/2016 0001   GFRNONAA >60 08/19/2016 0804   GFRAA >60 08/19/2016 0804    GFR Estimated Creatinine Clearance: 102.6 mL/min (by C-G formula based on SCr of 0.81 mg/dL). No results for input(s): LIPASE, AMYLASE in the last 72 hours. No results for input(s): CKTOTAL, CKMB, CKMBINDEX, TROPONINI in the last 72 hours. Invalid input(s): POCBNP No results for input(s): DDIMER in the last 72 hours. No results for input(s): HGBA1C in the last 72 hours. No results for input(s): CHOL, HDL, LDLCALC, TRIG, CHOLHDL, LDLDIRECT in the last 72 hours. No results for input(s): TSH, T4TOTAL, T3FREE, THYROIDAB in the last 72 hours.  Invalid input(s): FREET3 No results for input(s): VITAMINB12, FOLATE, FERRITIN, TIBC, IRON, RETICCTPCT in the last 72 hours. Coags: No results for input(s): INR in the last 72 hours.  Invalid input(s): PT Microbiology: Recent Results (from the past 240 hour(s))  Surgical pcr screen     Status: None   Collection Time: 08/17/16  2:31 AM  Result Value Ref Range Status   MRSA, PCR NEGATIVE NEGATIVE Final   Staphylococcus aureus NEGATIVE NEGATIVE Final    Comment:        The Xpert SA Assay (FDA approved for NASAL specimens in patients over 28 years of age), is one  component of a comprehensive surveillance program.  Test performance has been validated by [facility] for patients greater than or equal to 46 year old. It is not intended to diagnose infection nor to guide or monitor treatment.     FURTHER DISCHARGE INSTRUCTIONS:  Get Medicines reviewed and adjusted: Please take all your medications with you for your next visit with your Primary MD  Laboratory/radiological data: Please request your Primary MD to go over all hospital tests and procedure/radiological results at  the follow up, please ask your Primary MD to get all Hospital records sent to his/her office.  In some cases, they will be blood work, cultures and biopsy results pending at the time of your discharge. Please request that your primary care M.D. goes through all the records of your hospital data and follows up on these results.  Also Note the following: If you experience worsening of your admission symptoms, develop shortness of breath, life threatening emergency, suicidal or homicidal thoughts you must seek medical attention immediately by calling 911 or calling your MD immediately  if symptoms less severe.  You must read complete instructions/literature along with all the possible adverse reactions/side effects for all the Medicines you take and that have been prescribed to you. Take any new Medicines after you have completely understood and accpet all the possible adverse reactions/side effects.   Do not drive when taking Pain medications or sleeping medications (Benzodaizepines)  Do not take more than prescribed Pain, Sleep and Anxiety Medications. It is not advisable to combine anxiety,sleep and pain medications without talking with your primary care practitioner  Special Instructions: If you have smoked or chewed Tobacco  in the last 2 yrs please stop smoking, stop any regular Alcohol  and or any Recreational drug use.  Wear Seat belts while driving.  Please note: You  were cared for by a hospitalist during your hospital stay. Once you are discharged, your primary care physician will handle any further medical issues. Please note that NO REFILLS for any discharge medications will be authorized once you are discharged, as it is imperative that you return to your primary care physician (or establish a relationship with a primary care physician if you do not have one) for your post hospital discharge needs so that they can reassess your need for medications and monitor your lab values.  Total Time spent coordinating discharge including counseling, education and face to face time equals 45 minutes.  SignedOren Binet 08/20/2016 9:22 AM

## 2016-08-20 NOTE — Discharge Instructions (Signed)
Hip Fracture A hip fracture is a fracture of the upper part of your thigh bone (femur). What are the causes? A hip fracture is caused by a direct blow to the side of your hip. This is usually the result of a fall but can occur in other circumstances, such as an automobile accident. What increases the risk? There is an increased risk of hip fractures in people with:  An unsteady walking pattern (gait) and those with conditions that contribute to poor balance, such as Parkinson's disease or dementia.  Osteopenia and osteoporosis.  Cancer that spreads to the leg bones.  Certain metabolic diseases. What are the signs or symptoms? Symptoms of hip fracture include:  Pain over the injured hip.  Inability to put weight on the leg in which the fracture occurred (although, some patients are able to walk after a hip fracture).  Toes and foot of the affected leg point outward when you lie down. How is this diagnosed? A physical exam can determine if a hip fracture is likely to have occurred. X-ray exams are needed to confirm the fracture and to look for other injuries. The X-ray exam can help to determine the type of hip fracture. Rarely, the fracture is not visible on an X-ray image and a CT scan or MRI will have to be done. How is this treated? The treatment for a fracture is usually surgery. This means using a screw, nail, or rod to hold the bones in place. Follow these instructions at home: Take all medicines as directed by your health care provider. Contact a health care provider if: Pain continues, even after taking pain medicine. This information is not intended to replace advice given to you by your health care provider. Make sure you discuss any questions you have with your health care provider. Document Released: 06/26/2005 Document Revised: 12/02/2015 Document Reviewed: 02/05/2013 Elsevier Interactive Patient Education  2017 [place] on my medicine - XARELTO  (Rivaroxaban)  This medication education was reviewed with me or my healthcare representative as part of my discharge preparation.  The pharmacist that spoke with me during my hospital stay was:  Eudelia Bunch, [facility]  Why was Xarelto prescribed for you? Xarelto was prescribed for you to reduce the risk of blood clots forming after orthopedic surgery. The medical term for these abnormal blood clots is venous thromboembolism (VTE).  What do you need to know about xarelto ? Take your Xarelto ONCE DAILY at the same time every day. You may take it either with or without food.  If you have difficulty swallowing the tablet whole, you may crush it and mix in applesauce just prior to taking your dose.  Take Xarelto exactly as prescribed by your doctor and DO NOT stop taking Xarelto without talking to the doctor who prescribed the medication.  Stopping without other VTE prevention medication to take the place of Xarelto may increase your risk of developing a clot.  After discharge, you should have regular check-up appointments with your healthcare provider that is prescribing your Xarelto.    What do you do if you miss a dose? If you miss a dose, take it as soon as you remember on the same day then continue your regularly scheduled once daily regimen the next day. Do not take two doses of Xarelto on the same day.   Important Safety Information A possible side effect of Xarelto is bleeding. You should call your healthcare provider right away if you experience any of the following: ?  Bleeding from an injury or your nose that does not stop. ? Unusual colored urine (red or dark brown) or unusual colored stools (red or black). ? Unusual bruising for unknown reasons. ? A serious fall or if you hit your head (even if there is no bleeding).  Some medicines may interact with Xarelto and might increase your risk of bleeding while on Xarelto. To help avoid this, consult your healthcare provider or  pharmacist prior to using any new prescription or non-prescription medications, including herbals, vitamins, non-steroidal anti-inflammatory drugs (NSAIDs) and supplements.  This website has more information on Xarelto: https://guerra-benson.[email or web address]  Discharge Instructions after Hip Fracture   You can bear weight and ambulate as tolerated on both legs Use ice on the hip intermittently over the first 48 hours after surgery.  Pain medicine has been prescribed for you.  Use your medicine liberally over the first 48 hours, and then you can begin to taper your use. You may take Extra Strength Tylenol or Tylenol only in place of the pain pills. DO NOT take ANY nonsteroidal anti-inflammatory pain medications: Advil, Motrin, Ibuprofen, Aleve, Naproxen or Naprosyn.  Take aspirin or anticoagulant medication as prescribed for 4 weeks after surgery. Please notify if allergic or sensitivity to aspirin. You may shower  after surgery. The dressing is waterproof, leave it on until your first follow up with Dr. Tamera Punt. Simply allow the water to wash over the site and then pat dry. Do not rub the incisions.    Please call [phone] during normal business hours or [phone] after hours for any problems. Including the following:  - excessive redness of the incisions - drainage for more than 4 days - fever of more than 101.5 F  *Please note that pain medications will not be refilled after hours or on weekends.  Addiction and the Family WHAT IS ADDICTION? Addiction is a complex disease of the brain. It causes an uncontrollable (compulsive) need for a substance. You can be addicted to alcohol or illegal drugs or to prescription medicines, such as painkillers. Addiction can also be a behavior, like gambling or shopping. The need for the drug or activity can become so strong that you think about it all the time. You can also become physically dependent on a substance.  Addiction can change the way your brain works.  Because of these changes, getting more of whatever you are addicted to becomes the most important thing to you and feels better than other activities or relationships. Addiction can lead to changes in health, behavior, emotions, relationships, and choices that affect you and everyone around you. WHAT ARE COMMON SIGNS OF ADDICTION? Addiction can cause behavioral and emotional signs, as well as physical signs. Behavioral signs of addiction may include:  Having an intense craving for whatever you are addicted to.  Always thinking about your addiction.  Planning your life around your addiction.  Being unable to stop using a substance or participating in a behavior.  Devoting more time to the addiction. This might mean you no longer go to school or work or spend time with people you enjoy.  Having an increasing need for money. An addiction might make you ask people for unusual loans or steal items to sell.  Having exaggerated emotional responses to difficult situations. These may include:  Feeling anxious or stressed out.  Extreme irritability.  Aggression.  Lying.  Continuing with the addiction even after it has caused a bad outcome, such as:  Poor health.  Damaged relationships.  Money loss.  Job loss.  Getting hurt or arrested.  Having trouble being realistic about the negative effects of addiction. Physical signs of addiction may include:  Bloodshot eyes.  Nosebleeds.  Poor hygiene.  Changing sleep patterns.  Shakes and tremors.  Slurred speech.  Confusion.  Unconsciousness. HOW CAN ADDICTION AFFECT FAMILY MEMBERS? Addiction affects everyone in a family. It touches all aspects of family life, including finances, communication, work, school, and free time. Children of an addict might have:   Birth defects, if the mother was addicted during pregnancy.  Physical, emotional, and behavioral problems.  Trouble in school.  Injury from violence, abuse, or  accidents.  Problems because of neglect.  A greater risk for addiction later in life. Parents of an addict might experience:   Confusion.  Worry.  Guilt.  Fear.  Sadness.  A desire to make the addiction seem less of a problem than it is.  Financial strain.  Feeling isolated from family and friends.  Physical health changes from stress or from violent confrontations. Brothers, sisters, and extended family members of an addict might experience:  Worry.  Anger.  Fear.  Resentment.  Confusion.  A desire to hide the addiction and its impact.  Financial strain. HOW DO I KNOW IF TREATMENT FOR ADDICTION IS NEEDED? Addiction is a progressive disease. Without treatment, addiction can get worse. Living with addiction puts you at higher risk for injury, poor health, lost employment, loss of money, and even death.  You might need treatment for addiction if:  You have tried to stop or cut down, and you cannot.  Your addiction is causing physical health problems.  You find it annoying that your friends and family are concerned about your alcohol or substance use.  You feel guilty about substance abuse or a behavior.  You have lied or tried to hide your addiction.  You need a particular substance to start your day or calm down.  You are getting in trouble at school, work, home, or with the police.  You have done something illegal to support your addiction.  You are running out of money because of your addiction.  You have no time for anything other than your addiction. WHAT TYPES OF TREATMENT OPTIONS ARE AVAILABLE?  Treatment for the Addict  The treatment program that is right for you will depend on many factors, including the type of addiction you have. Treatment programs can be outpatient or inpatient. In an outpatient program, you live at home and go to work but also go to a clinic for treatment. With an inpatient program, you sleep and live at the program facility  during treatment. After treatment, you might need a plan for support during recovery. Other treatment options include:  Medicine.  Some addictions may be treated with prescription medicines.  You might also need medicine to treat anxiety or depression.  Counseling and behavior therapy. Therapy can help individuals and families behave and relate more effectively.  Support groups. Confidential group therapy, such as twelve-step program, can help individuals and families during treatment and recovery. Treatment for Family Members Addiction affects the entire family. Ask your health care provider or counselor to recommend resources for family members. You can call [employer] at [phone] or visit their website at http://www.nar-anon.org/find-a-group/ [place]?  Ask your health care provider for help finding addiction treatment. These discussions are confidential.  The [employer] on Alcoholism and Drug Dependence (NCADD). This group has information on treatment centers and programs for people who have  an addiction and for family members.  Their telephone number is 1-800-NCA-CALL.  Their website is https://ncadd.org/affiliate-network/find-an-affiliate  The Substance Abuse and Mental Health Services Administration [facility]). This group will help you find publicly funded treatment centers, help hotlines, and counseling services near you.  Their telephone number is 1-800-662-HELP (4357).  Their website is www.findtreatment.SamedayNews.com.cy This information is not intended to replace advice given to you by your health care provider. Make sure you discuss any questions you have with your health care provider. Document Released: 03/01/2004 Document Revised: 10/18/2015 Document Reviewed: 09/15/2013 Elsevier Interactive Patient Education  2017 [employer].

## 2016-08-20 NOTE — Progress Notes (Signed)
ANTICOAGULATION CONSULT NOTE - Initial Consult  Pharmacy Consult for xarelto Indication: VTE prophylaxis  Allergies  Allergen Reactions  . Dilaudid [Hydromorphone Hcl] Nausea And Vomiting    Pre pt history  . Oxycodone Nausea And Vomiting  . Procaine Hcl Other (See Comments)    Patient states he is allergic to novicaine  . Zofran [Ondansetron] Nausea And Vomiting  . Sulfa Antibiotics Rash    Patient Measurements: Height: '5\' 10"'$  (177.8 cm) Weight: 178 lb (80.7 kg) IBW/kg (Calculated) : 73   Vital Signs: Temp: 98.8 F (37.1 C) (02/11 0700) Temp Source: Oral (02/11 0700) BP: 115/66 (02/11 0831) Pulse Rate: 78 (02/11 0831)  Labs:  Recent Labs  08/18/16 0415 08/19/16 0804  HGB 11.1*  --   HCT 32.7*  --   PLT 185  --   CREATININE 0.74 0.81    Estimated Creatinine Clearance: 102.6 mL/min (by C-G formula based on SCr of 0.81 mg/dL).   Medical History: Past Medical History:  Diagnosis Date  . Alcohol abuse   . Alcoholic hepatitis   . Allergy   . Anxiety   . 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 C. difficile colitis   . Depression   . DVT (deep venous thrombosis) ([place]) 2015  . GERD (gastroesophageal reflux disease)   . Hypertension   . Ileus ([place]) 09/2013  . Prostate cancer ([place]) 05/22/13   Gleason 4+3=7  . Shortness of breath    new onset- occasionally-at rest and exertion  . Substance abuse   . TIA (transient ischemic attack) 08/2013    Medications:  Prescriptions Prior to Admission  Medication Sig Dispense Refill Last Dose  . amLODipine (NORVASC) 10 MG tablet Take 1 tablet (10 mg total) by mouth daily. 90 tablet 1 08/16/2016 at Unknown time  . clonazePAM (KLONOPIN) 0.5 MG tablet TAKE 1 TABLET BY MOUTH THREE TIMES DAILYAS NEEDED FOR ANXIETY 90 tablet 1 08/16/2016 at Unknown time  . FLUoxetine (PROZAC) 40 MG capsule TAKE 1 CAPSULE BY MOUTH DAILY 90 capsule 1 08/15/2016 at Unknown time  . lisinopril (PRINIVIL,ZESTRIL) 40 MG tablet TAKE ONE (1) TABLET BY MOUTH EVERY  DAY 90 tablet 1 08/16/2016 at Unknown time  . metoprolol (LOPRESSOR) 100 MG tablet Take 1 tablet (100 mg total) by mouth 2 (two) times daily. 180 tablet 1 08/16/2016 at 0930  . omeprazole (PRILOSEC) 40 MG capsule TAKE ONE CAPSULE BY MOUTH TWICE A DAY 180 capsule 1 08/16/2016 at Unknown time  . traZODone (DESYREL) 50 MG tablet Take 50 mg by mouth at bedtime as needed for sleep.    Past Week at Unknown time  . simvastatin (ZOCOR) 20 MG tablet TAKE ONE (1) TABLET BY MOUTH EVERY DAY (Patient not taking: Reported on 08/16/2016) 90 tablet 1 Not Taking at Unknown time    Assessment: 61 yo M s/p IM nail R hip.  Pharmacy consulted to dose xarelto for VTE px x 1 month post op.  He was on therapeutic dose of Xarelto 02/23/15 for DVT.  Wt 80.7 kg, Creat WNL.  Hg 11.1 on 2/9 and PLTC WNL,  On LMWH 40 qda - got dose at 0833 am today.   Goal of Therapy:  Monitor platelets by anticoagulation protocol: Yes   Plan:  xarelto 10 mg daily x 1 month- will start dose at 2200 2nd to got LMWH 40 this am  Eudelia Bunch, Pharm.D. [phone] 08/20/2016 9:09 AM

## 2016-08-20 NOTE — Care Management Note (Signed)
Case Management Note  Patient Details  Name: Obed Samek MRN: [id number] Date of Birth: 1956-01-06  Subjective/Objective:  61 y.o. M to be discharged with HHPT provided by [facility] as this is a agency he has used in the past. Denies DME needs. Jermaine with [facility] has confirmed referral.                   Action/Plan:CM will sign off for now but will be available should additional discharge needs arise or disposition change.    Expected Discharge Date:  08/20/16               Expected Discharge Plan:  [place]  In-House Referral:  NA  Discharge planning Services  CM Consult  Post Acute Care Choice:  NA Choice offered to:  Patient  DME Arranged:  N/A (states he has all DME re: 3n1 and RW) DME Agency:  NA  HH Arranged:  PT [place] Agency:  [place]  Status of Service:  Completed, signed off  If discussed at [place] of Stay Meetings, dates discussed:    Additional Comments:  Delrae Sawyers, RN 08/20/2016, 1:03 PM

## 2016-08-20 NOTE — Progress Notes (Signed)
Pt educated on all DC instructions. Provided pt with extra resources for alcohol abstinence.No other concerns. Will monitor for any changes,.

## 2016-08-22 DIAGNOSIS — I10 Essential (primary) hypertension: Secondary | ICD-10-CM | POA: Diagnosis not present

## 2016-08-22 DIAGNOSIS — S72001D Fracture of unspecified part of neck of right femur, subsequent encounter for closed fracture with routine healing: Secondary | ICD-10-CM | POA: Diagnosis not present

## 2016-08-22 DIAGNOSIS — S22000D Wedge compression fracture of unspecified thoracic vertebra, subsequent encounter for fracture with routine healing: Secondary | ICD-10-CM | POA: Diagnosis not present

## 2016-08-22 DIAGNOSIS — R69 Illness, unspecified: Secondary | ICD-10-CM | POA: Diagnosis not present

## 2016-08-22 DIAGNOSIS — K219 Gastro-esophageal reflux disease without esophagitis: Secondary | ICD-10-CM | POA: Diagnosis not present

## 2016-08-22 DIAGNOSIS — F17210 Nicotine dependence, cigarettes, uncomplicated: Secondary | ICD-10-CM | POA: Diagnosis not present

## 2016-08-22 DIAGNOSIS — Z8546 Personal history of malignant neoplasm of prostate: Secondary | ICD-10-CM | POA: Diagnosis not present

## 2016-08-22 DIAGNOSIS — M810 Age-related osteoporosis without current pathological fracture: Secondary | ICD-10-CM | POA: Diagnosis not present

## 2016-08-22 DIAGNOSIS — E46 Unspecified protein-calorie malnutrition: Secondary | ICD-10-CM | POA: Diagnosis not present

## 2016-08-22 NOTE — Progress Notes (Signed)
Subjective:    Patient ID: Tanner [name], male    DOB: 1956/01/11, 61 y.o.   MRN: [id number]  Chief Complaint  Patient presents with  . Hospitalization Follow-up    wants to talk about blood thinner, xarelto refill or samples    HPI:  Tanner [name] is a 61 y.o. male who  has a past medical history of Alcohol abuse; Alcoholic hepatitis; Allergy; Anxiety; Arthritis; C. difficile colitis; Depression; DVT (deep venous thrombosis) ([place]) (2015); GERD (gastroesophageal reflux disease); Hypertension; Ileus ([place]) (09/2013); Prostate cancer ([place]) (05/22/13); Shortness of breath; Substance abuse; and TIA (transient ischemic attack) (08/2013). and presents today for a hospitalization follow up.  Recently evaluated in the emergency department and admitted to the hospital following a fall. Describes that he had been drinking alcohol that afternoon a bent over to pick up something when he lost his footing and fell to the ground. Unsure of loss of consciousness, but believes he may have hit his head on a vacuum cleaner. He was noted to have sharp hip pain worse with movement and nonradiating. Has been consuming approximately 8-9 beers per day. Physical exam with mild tenderness with passive range of motion of the right hip and mild right knee pain with movement. Chest x-ray showed chronic T8 compression with dextroscoliosis of the lower thoracic spine. 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 X-rays of left hip were positive for an acute, closed, basocervical fracture of the right femur with varus angulation. Incomplete nondisplaced fracture of the base of the greater trochanter was also noted. Underwent operative repair on 2/8 and postoperative course was uncomplicated. Orthopedics recommends weightbearing as tolerated and Xarelto for venous thromboembolism prophylaxis. PT evaluation completed with recommended for home health physical  therapy. He was prescribed narcotics by orthopedics with long conversation regarding mixing medications and risk for polypharmacy in the setting of alcohol usage. He was noted to have hyponatremia most likely related to alcohol. His alcohol withdrawal is with Librium and tapered off. He was counseled on alcohol usage. Hospitalist recommends obtaining BMP and CBC in one week following hospitalization. He also needs to be on Xarelto for 30 days from 2/8. All hospital records, labs, imaging reviewed in detail.  Since leaving the hospital he reports that he has been doing okay and continues to have pain located in the right hip. Reports taking the medication as prescribed and has had some nausea which waxes and wanes. Reports taking the Xarelto as prescribed and notes a little bit of blood in his stool which he believes may be from hemorrhoid which was easily stopped. He is in need of additional medication. Ambulating with the walker and able to put over 50% of his body weight. Will be starting with physical therapy today. Able to complete his activities of daily living. Has remained sober and alcohol free.  Allergies  Allergen Reactions  . Dilaudid [Hydromorphone Hcl] Nausea And Vomiting    Pre pt history  . Oxycodone Nausea And Vomiting  . Procaine Hcl Other (See Comments)    Patient states he is allergic to novicaine  . Zofran [Ondansetron] Nausea And Vomiting  . Sulfa Antibiotics Rash      Outpatient Medications Prior to Visit  Medication Sig Dispense Refill  . amLODipine (NORVASC) 10 MG tablet Take 1 tablet (10 mg total) by mouth daily. 90 tablet 1  . clonazePAM (KLONOPIN) 0.5 MG tablet TAKE 1 TABLET BY MOUTH THREE TIMES DAILYAS  NEEDED FOR ANXIETY 90 tablet 1  . docusate sodium (COLACE) 100 MG capsule Take 1 capsule (100 mg total) by mouth 3 (three) times daily as needed. 20 capsule 0  . feeding supplement, ENSURE ENLIVE, (ENSURE ENLIVE) LIQD Take 237 mLs by mouth 2 (two) times daily between  meals. 60 Bottle 0  . FLUoxetine (PROZAC) 40 MG capsule TAKE 1 CAPSULE BY MOUTH DAILY 90 capsule 1  . lisinopril (PRINIVIL,ZESTRIL) 40 MG tablet TAKE ONE (1) TABLET BY MOUTH EVERY DAY 90 tablet 1  . metoprolol (LOPRESSOR) 100 MG tablet Take 1 tablet (100 mg total) by mouth 2 (two) times daily. 180 tablet 1  . Multiple Vitamin (MULTIVITAMIN WITH MINERALS) TABS tablet Take 1 tablet by mouth daily. 30 tablet 0  . omeprazole (PRILOSEC) 40 MG capsule TAKE ONE CAPSULE BY MOUTH TWICE A DAY 180 capsule 1  . oxyCODONE-acetaminophen (ROXICET) 5-325 MG tablet Take 1-2 tablets by mouth every 4 (four) hours as needed for severe pain. 60 tablet 0  . rivaroxaban (XARELTO) 10 MG TABS tablet Take 1 tablet (10 mg total) by mouth at bedtime. Start today at 2200 hours 27 tablet 0  . simvastatin (ZOCOR) 20 MG tablet TAKE ONE (1) TABLET BY MOUTH EVERY DAY 90 tablet 1  . thiamine 100 MG tablet Take 1 tablet (100 mg total) by mouth daily. 30 tablet 0  . traZODone (DESYREL) 50 MG tablet Take 50 mg by mouth at bedtime as needed for sleep.      No facility-administered medications prior to visit.       Past Surgical History:  Procedure Laterality Date  . HAND SURGERY Right   . HERNIA REPAIR    . INTRAMEDULLARY (IM) NAIL INTERTROCHANTERIC Right 08/17/2016   Procedure: INTRAMEDULLARY (IM) NAIL RIGHT HIP;  Surgeon: Tania Ade, MD;  Location: [facility];  Service: Orthopedics;  Laterality: Right;  . LYMPHADENECTOMY Bilateral 08/29/2013   Procedure: LYMPHADENECTOMY "BILATERAL PELVIC LYMPH NODE DISSECTION";  Surgeon: Molli Hazard, MD;  [facility];  Service: Urology;  Laterality: Bilateral;  . ORIF ANKLE FRACTURE Right 12/10/2012   Procedure: OPEN REDUCTION INTERNAL FIXATION (ORIF) ANKLE FRACTURE;  Surgeon: Hessie Dibble, MD;  [facility];  Service: Orthopedics;  Laterality: Right;  . ROBOT ASSISTED LAPAROSCOPIC RADICAL PROSTATECTOMY N/A 08/29/2013   Procedure: ROBOTIC ASSISTED LAPAROSCOPIC RADICAL  PROSTATECTOMY LEVEL 2, Lysis of adhesions;  Surgeon: Molli Hazard, MD;  [facility];  Service: Urology;  Laterality: N/A;      Past Medical History:  Diagnosis Date  . Alcohol abuse   . Alcoholic hepatitis   . Allergy   . Anxiety   . 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 C. difficile colitis   . Depression   . DVT (deep venous thrombosis) ([place]) 2015  . GERD (gastroesophageal reflux disease)   . Hypertension   . Ileus ([place]) 09/2013  . Prostate cancer ([place]) 05/22/13   Gleason 4+3=7  . Shortness of breath    new onset- occasionally-at rest and exertion  . Substance abuse   . TIA (transient ischemic attack) 08/2013      Review of Systems  Constitutional: Negative for chills and fever.  Respiratory: Negative for chest tightness, shortness of breath and wheezing.   Cardiovascular: Negative for chest pain, palpitations and leg swelling.  Musculoskeletal:       Positive for right hip pain.  Neurological: Positive for weakness. Negative for numbness.  Hematological: Negative for adenopathy. Does not bruise/bleed easily.  Objective:    BP 92/62 (BP Location: Left Arm, Patient Position: Sitting, Cuff Size: Normal)   Pulse 73   Temp 98.2 F (36.8 C) (Oral)   Resp 16   Ht '5\' 10"'$  (1.778 m)   Wt 166 lb (75.3 kg)   SpO2 93%   BMI 23.82 kg/m  Nursing note and vital signs reviewed.  Physical Exam  Constitutional: He is oriented to person, place, and time. He appears well-developed and well-nourished. No distress.  Cardiovascular: Normal rate, regular rhythm, normal heart sounds and intact distal pulses.   Pulmonary/Chest: Effort normal and breath sounds normal.  Musculoskeletal:  Right hip - dressing with mild amount of apparent serous fluid with no evidence of infection and appears intact. Slight reduced range of motion secondary to pain. Currently walking with a walker. Range of motion slightly limited secondary to pain. Distal pulses and sensation are intact and  appropriate.  Neurological: He is alert and oriented to person, place, and time.  Skin: Skin is warm and dry.  Psychiatric: He has a normal mood and affect. His behavior is normal. Judgment and thought content normal.       Assessment & Plan:   Problem List Items Addressed This Visit      Musculoskeletal and Integument   Hip fracture ([place])    This is a new problem. Hip fracture appears to be healing adequately and currently maintained on Xarelto for anticoagulation 30 days as well as pain medication. Continues to experience discomfort which is manageable unable to complete activities of daily living. Continue current dosage of Xarelto. Pain management per orthopedics.        Genitourinary   Malignant neoplasm of prostate [facility])    Previously diagnosed with prostate cancer and currently in remission. Patient requesting PSA deferred to urology. Obtain PSA.      Relevant Orders   PSA (Completed)     Other   Hyponatremia - Primary    Previously noted to have hyponatremia most likely associated with alcohol consumption. Obtain CBC and basic metabolic profile. Encouraged continued abstinence from alcohol. Continue to monitor pending blood work results.      Relevant Orders   CBC (Completed)   Basic Metabolic Panel (BMET) (Completed)       I am having Mr. Puerto maintain his traZODone, omeprazole, simvastatin, metoprolol, FLUoxetine, clonazePAM, amLODipine, lisinopril, oxyCODONE-acetaminophen, docusate sodium, feeding supplement (ENSURE ENLIVE), thiamine, rivaroxaban, and multivitamin with minerals.   No orders of the defined types were placed in this encounter.    Follow-up: No Follow-up on file.  Mauricio Po, [name]

## 2016-08-23 ENCOUNTER — Other Ambulatory Visit (INDEPENDENT_AMBULATORY_CARE_PROVIDER_SITE_OTHER): Payer: Medicare HMO

## 2016-08-23 ENCOUNTER — Ambulatory Visit (INDEPENDENT_AMBULATORY_CARE_PROVIDER_SITE_OTHER): Payer: Medicare HMO | Admitting: Family

## 2016-08-23 ENCOUNTER — Encounter: Payer: Self-pay | Admitting: Family

## 2016-08-23 VITALS — BP 92/62 | HR 73 | Temp 98.2°F | Resp 16 | Ht 70.0 in | Wt 166.0 lb

## 2016-08-23 DIAGNOSIS — E46 Unspecified protein-calorie malnutrition: Secondary | ICD-10-CM | POA: Diagnosis not present

## 2016-08-23 DIAGNOSIS — M810 Age-related osteoporosis without current pathological fracture: Secondary | ICD-10-CM | POA: Diagnosis not present

## 2016-08-23 DIAGNOSIS — C61 Malignant neoplasm of prostate: Secondary | ICD-10-CM

## 2016-08-23 DIAGNOSIS — S72001D Fracture of unspecified part of neck of right femur, subsequent encounter for closed fracture with routine healing: Secondary | ICD-10-CM | POA: Diagnosis not present

## 2016-08-23 DIAGNOSIS — I10 Essential (primary) hypertension: Secondary | ICD-10-CM | POA: Diagnosis not present

## 2016-08-23 DIAGNOSIS — E871 Hypo-osmolality and hyponatremia: Secondary | ICD-10-CM

## 2016-08-23 DIAGNOSIS — Z8546 Personal history of malignant neoplasm of prostate: Secondary | ICD-10-CM

## 2016-08-23 DIAGNOSIS — S22000D Wedge compression fracture of unspecified thoracic vertebra, subsequent encounter for fracture with routine healing: Secondary | ICD-10-CM | POA: Diagnosis not present

## 2016-08-23 DIAGNOSIS — K219 Gastro-esophageal reflux disease without esophagitis: Secondary | ICD-10-CM | POA: Diagnosis not present

## 2016-08-23 DIAGNOSIS — S72001A Fracture of unspecified part of neck of right femur, initial encounter for closed fracture: Secondary | ICD-10-CM | POA: Diagnosis not present

## 2016-08-23 DIAGNOSIS — R69 Illness, unspecified: Secondary | ICD-10-CM | POA: Diagnosis not present

## 2016-08-23 LAB — BASIC METABOLIC PANEL
BUN: 24 mg/dL — AB (ref 6–23)
CALCIUM: 9 mg/dL (ref 8.4–10.5)
CO2: 30 mEq/L (ref 19–32)
Chloride: 99 mEq/L (ref 96–112)
Creatinine, Ser: 0.95 mg/dL (ref 0.40–1.50)
GFR: 86.49 mL/min (ref >60.00)
GLUCOSE: 91 mg/dL (ref 70–99)
Potassium: 4.7 mEq/L (ref 3.5–5.1)
SODIUM: 134 meq/L — AB (ref 135–145)

## 2016-08-23 LAB — CBC
HEMATOCRIT: 31.7 % — AB (ref 39.0–52.0)
Hemoglobin: 10.8 g/dL — ABNORMAL LOW (ref 13.0–17.0)
MCHC: 34.1 g/dL (ref 30.0–36.0)
MCV: 100.3 fl — AB (ref 78.0–100.0)
Platelets: 313 10*3/uL (ref 150.0–400.0)
RBC: 3.16 Mil/uL — AB (ref 4.22–5.81)
RDW: 13.8 % (ref 11.5–15.5)
WBC: 9.8 10*3/uL (ref 4.0–10.5)

## 2016-08-23 LAB — PSA: PSA: 0 ng/mL — AB (ref 0.10–4.00)

## 2016-08-23 NOTE — Patient Instructions (Addendum)
Thank you for choosing [facility].  SUMMARY AND INSTRUCTIONS:  Continue with your Xarelto.  Continue to work with physical therapy.  Medication:  Your prescription(s) have been submitted to your pharmacy or been printed and provided for you. Please take as directed and contact our office if you believe you are having problem(s) with the medication(s) or have any questions.  Labs:  Please stop by the lab on the lower level of the building for your blood work. Your results will be released to [place] (or called to you) after review, usually within 72 hours after test completion. If any changes need to be made, you will be notified at that same time.  1.) The lab is open from 7:30am to 5:30 pm Monday-Friday 2.) No appointment is necessary 3.) Fasting (if needed) is 6-8 hours after food and drink; black coffee and water are okay   Follow up:  If your symptoms worsen or fail to improve, please contact our office for further instruction, or in case of emergency go directly to the emergency room at the closest medical facility.

## 2016-08-23 NOTE — Assessment & Plan Note (Signed)
Previously diagnosed with prostate cancer and currently in remission. Patient requesting PSA deferred to urology. Obtain PSA.

## 2016-08-23 NOTE — Assessment & Plan Note (Addendum)
This is a new problem. Hip fracture appears to be healing adequately and currently maintained on Xarelto for anticoagulation 30 days as well as pain medication. Continues to experience discomfort which is manageable unable to complete activities of daily living. Continue current dosage of Xarelto. Pain management per orthopedics.

## 2016-08-23 NOTE — Assessment & Plan Note (Signed)
Previously noted to have hyponatremia most likely associated with alcohol consumption. Obtain CBC and basic metabolic profile. Encouraged continued abstinence from alcohol. Continue to monitor pending blood work results.

## 2016-08-24 DIAGNOSIS — E46 Unspecified protein-calorie malnutrition: Secondary | ICD-10-CM | POA: Diagnosis not present

## 2016-08-24 DIAGNOSIS — K219 Gastro-esophageal reflux disease without esophagitis: Secondary | ICD-10-CM | POA: Diagnosis not present

## 2016-08-24 DIAGNOSIS — I10 Essential (primary) hypertension: Secondary | ICD-10-CM | POA: Diagnosis not present

## 2016-08-24 DIAGNOSIS — S72001D Fracture of unspecified part of neck of right femur, subsequent encounter for closed fracture with routine healing: Secondary | ICD-10-CM | POA: Diagnosis not present

## 2016-08-24 DIAGNOSIS — S22000D Wedge compression fracture of unspecified thoracic vertebra, subsequent encounter for fracture with routine healing: Secondary | ICD-10-CM | POA: Diagnosis not present

## 2016-08-24 DIAGNOSIS — M810 Age-related osteoporosis without current pathological fracture: Secondary | ICD-10-CM | POA: Diagnosis not present

## 2016-08-24 DIAGNOSIS — R69 Illness, unspecified: Secondary | ICD-10-CM | POA: Diagnosis not present

## 2016-08-24 DIAGNOSIS — Z8546 Personal history of malignant neoplasm of prostate: Secondary | ICD-10-CM | POA: Diagnosis not present

## 2016-08-25 ENCOUNTER — Other Ambulatory Visit: Payer: Self-pay | Admitting: *Deleted

## 2016-08-25 DIAGNOSIS — E46 Unspecified protein-calorie malnutrition: Secondary | ICD-10-CM | POA: Diagnosis not present

## 2016-08-25 DIAGNOSIS — S22000D Wedge compression fracture of unspecified thoracic vertebra, subsequent encounter for fracture with routine healing: Secondary | ICD-10-CM | POA: Diagnosis not present

## 2016-08-25 DIAGNOSIS — M810 Age-related osteoporosis without current pathological fracture: Secondary | ICD-10-CM | POA: Diagnosis not present

## 2016-08-25 DIAGNOSIS — Z8546 Personal history of malignant neoplasm of prostate: Secondary | ICD-10-CM | POA: Diagnosis not present

## 2016-08-25 DIAGNOSIS — R69 Illness, unspecified: Secondary | ICD-10-CM | POA: Diagnosis not present

## 2016-08-25 DIAGNOSIS — S72001D Fracture of unspecified part of neck of right femur, subsequent encounter for closed fracture with routine healing: Secondary | ICD-10-CM | POA: Diagnosis not present

## 2016-08-25 DIAGNOSIS — K219 Gastro-esophageal reflux disease without esophagitis: Secondary | ICD-10-CM | POA: Diagnosis not present

## 2016-08-25 DIAGNOSIS — I10 Essential (primary) hypertension: Secondary | ICD-10-CM | POA: Diagnosis not present

## 2016-08-25 MED ORDER — TRAZODONE HCL 50 MG PO TABS: 50.0000 mg | ORAL_TABLET | Freq: Every evening | ORAL | 0 refills | Status: DC | PRN

## 2016-08-25 NOTE — Telephone Encounter (Signed)
Rec'd call pt states saw Marya Amsler on wed and he was suppose to send refill on his Trazodone, but [facility]. [facility]

## 2016-08-28 DIAGNOSIS — S22000D Wedge compression fracture of unspecified thoracic vertebra, subsequent encounter for fracture with routine healing: Secondary | ICD-10-CM | POA: Diagnosis not present

## 2016-08-28 DIAGNOSIS — S72001D Fracture of unspecified part of neck of right femur, subsequent encounter for closed fracture with routine healing: Secondary | ICD-10-CM | POA: Diagnosis not present

## 2016-08-28 DIAGNOSIS — K219 Gastro-esophageal reflux disease without esophagitis: Secondary | ICD-10-CM | POA: Diagnosis not present

## 2016-08-28 DIAGNOSIS — E46 Unspecified protein-calorie malnutrition: Secondary | ICD-10-CM | POA: Diagnosis not present

## 2016-08-28 DIAGNOSIS — M810 Age-related osteoporosis without current pathological fracture: Secondary | ICD-10-CM | POA: Diagnosis not present

## 2016-08-28 DIAGNOSIS — Z8546 Personal history of malignant neoplasm of prostate: Secondary | ICD-10-CM | POA: Diagnosis not present

## 2016-08-28 DIAGNOSIS — I10 Essential (primary) hypertension: Secondary | ICD-10-CM | POA: Diagnosis not present

## 2016-08-28 DIAGNOSIS — R69 Illness, unspecified: Secondary | ICD-10-CM | POA: Diagnosis not present

## 2016-08-29 DIAGNOSIS — R69 Illness, unspecified: Secondary | ICD-10-CM | POA: Diagnosis not present

## 2016-08-29 DIAGNOSIS — M810 Age-related osteoporosis without current pathological fracture: Secondary | ICD-10-CM | POA: Diagnosis not present

## 2016-08-29 DIAGNOSIS — S72001D Fracture of unspecified part of neck of right femur, subsequent encounter for closed fracture with routine healing: Secondary | ICD-10-CM | POA: Diagnosis not present

## 2016-08-29 DIAGNOSIS — S22000D Wedge compression fracture of unspecified thoracic vertebra, subsequent encounter for fracture with routine healing: Secondary | ICD-10-CM | POA: Diagnosis not present

## 2016-08-29 DIAGNOSIS — K219 Gastro-esophageal reflux disease without esophagitis: Secondary | ICD-10-CM | POA: Diagnosis not present

## 2016-08-29 DIAGNOSIS — I10 Essential (primary) hypertension: Secondary | ICD-10-CM | POA: Diagnosis not present

## 2016-08-29 DIAGNOSIS — Z8546 Personal history of malignant neoplasm of prostate: Secondary | ICD-10-CM | POA: Diagnosis not present

## 2016-08-29 DIAGNOSIS — E46 Unspecified protein-calorie malnutrition: Secondary | ICD-10-CM | POA: Diagnosis not present

## 2016-08-30 DIAGNOSIS — S72041D Displaced fracture of base of neck of right femur, subsequent encounter for closed fracture with routine healing: Secondary | ICD-10-CM | POA: Diagnosis not present

## 2016-08-31 DIAGNOSIS — S22000D Wedge compression fracture of unspecified thoracic vertebra, subsequent encounter for fracture with routine healing: Secondary | ICD-10-CM | POA: Diagnosis not present

## 2016-08-31 DIAGNOSIS — K219 Gastro-esophageal reflux disease without esophagitis: Secondary | ICD-10-CM | POA: Diagnosis not present

## 2016-08-31 DIAGNOSIS — E46 Unspecified protein-calorie malnutrition: Secondary | ICD-10-CM | POA: Diagnosis not present

## 2016-08-31 DIAGNOSIS — R69 Illness, unspecified: Secondary | ICD-10-CM | POA: Diagnosis not present

## 2016-08-31 DIAGNOSIS — I10 Essential (primary) hypertension: Secondary | ICD-10-CM | POA: Diagnosis not present

## 2016-08-31 DIAGNOSIS — Z8546 Personal history of malignant neoplasm of prostate: Secondary | ICD-10-CM | POA: Diagnosis not present

## 2016-08-31 DIAGNOSIS — M810 Age-related osteoporosis without current pathological fracture: Secondary | ICD-10-CM | POA: Diagnosis not present

## 2016-08-31 DIAGNOSIS — S72001D Fracture of unspecified part of neck of right femur, subsequent encounter for closed fracture with routine healing: Secondary | ICD-10-CM | POA: Diagnosis not present

## 2016-09-01 DIAGNOSIS — E46 Unspecified protein-calorie malnutrition: Secondary | ICD-10-CM | POA: Diagnosis not present

## 2016-09-01 DIAGNOSIS — S72001D Fracture of unspecified part of neck of right femur, subsequent encounter for closed fracture with routine healing: Secondary | ICD-10-CM | POA: Diagnosis not present

## 2016-09-01 DIAGNOSIS — M810 Age-related osteoporosis without current pathological fracture: Secondary | ICD-10-CM | POA: Diagnosis not present

## 2016-09-01 DIAGNOSIS — I10 Essential (primary) hypertension: Secondary | ICD-10-CM | POA: Diagnosis not present

## 2016-09-01 DIAGNOSIS — K219 Gastro-esophageal reflux disease without esophagitis: Secondary | ICD-10-CM | POA: Diagnosis not present

## 2016-09-01 DIAGNOSIS — Z8546 Personal history of malignant neoplasm of prostate: Secondary | ICD-10-CM | POA: Diagnosis not present

## 2016-09-01 DIAGNOSIS — R69 Illness, unspecified: Secondary | ICD-10-CM | POA: Diagnosis not present

## 2016-09-01 DIAGNOSIS — S22000D Wedge compression fracture of unspecified thoracic vertebra, subsequent encounter for fracture with routine healing: Secondary | ICD-10-CM | POA: Diagnosis not present

## 2016-09-05 DIAGNOSIS — S22000D Wedge compression fracture of unspecified thoracic vertebra, subsequent encounter for fracture with routine healing: Secondary | ICD-10-CM | POA: Diagnosis not present

## 2016-09-05 DIAGNOSIS — I10 Essential (primary) hypertension: Secondary | ICD-10-CM | POA: Diagnosis not present

## 2016-09-05 DIAGNOSIS — M810 Age-related osteoporosis without current pathological fracture: Secondary | ICD-10-CM | POA: Diagnosis not present

## 2016-09-05 DIAGNOSIS — E46 Unspecified protein-calorie malnutrition: Secondary | ICD-10-CM | POA: Diagnosis not present

## 2016-09-05 DIAGNOSIS — Z8546 Personal history of malignant neoplasm of prostate: Secondary | ICD-10-CM | POA: Diagnosis not present

## 2016-09-05 DIAGNOSIS — S72001D Fracture of unspecified part of neck of right femur, subsequent encounter for closed fracture with routine healing: Secondary | ICD-10-CM | POA: Diagnosis not present

## 2016-09-05 DIAGNOSIS — R69 Illness, unspecified: Secondary | ICD-10-CM | POA: Diagnosis not present

## 2016-09-05 DIAGNOSIS — K219 Gastro-esophageal reflux disease without esophagitis: Secondary | ICD-10-CM | POA: Diagnosis not present

## 2016-09-15 ENCOUNTER — Other Ambulatory Visit: Payer: Self-pay | Admitting: Family

## 2016-09-15 DIAGNOSIS — I10 Essential (primary) hypertension: Secondary | ICD-10-CM

## 2016-09-17 ENCOUNTER — Other Ambulatory Visit: Payer: Self-pay | Admitting: Family

## 2016-09-17 DIAGNOSIS — F411 Generalized anxiety disorder: Secondary | ICD-10-CM

## 2016-09-18 NOTE — Telephone Encounter (Signed)
Faxed

## 2016-09-18 NOTE — Telephone Encounter (Signed)
Last refill was 07/12/16

## 2016-09-19 ENCOUNTER — Ambulatory Visit: Payer: Self-pay | Admitting: Family

## 2016-09-20 ENCOUNTER — Ambulatory Visit (INDEPENDENT_AMBULATORY_CARE_PROVIDER_SITE_OTHER): Payer: Medicare HMO | Admitting: Family

## 2016-09-20 ENCOUNTER — Encounter: Payer: Self-pay | Admitting: Family

## 2016-09-20 ENCOUNTER — Other Ambulatory Visit (INDEPENDENT_AMBULATORY_CARE_PROVIDER_SITE_OTHER): Payer: Medicare HMO

## 2016-09-20 VITALS — BP 120/84 | HR 70 | Temp 98.2°F | Resp 16 | Ht 70.0 in | Wt 169.0 lb

## 2016-09-20 DIAGNOSIS — E785 Hyperlipidemia, unspecified: Secondary | ICD-10-CM | POA: Insufficient documentation

## 2016-09-20 DIAGNOSIS — E782 Mixed hyperlipidemia: Secondary | ICD-10-CM | POA: Diagnosis not present

## 2016-09-20 DIAGNOSIS — F411 Generalized anxiety disorder: Secondary | ICD-10-CM | POA: Diagnosis not present

## 2016-09-20 DIAGNOSIS — I10 Essential (primary) hypertension: Secondary | ICD-10-CM | POA: Diagnosis not present

## 2016-09-20 DIAGNOSIS — R69 Illness, unspecified: Secondary | ICD-10-CM | POA: Diagnosis not present

## 2016-09-20 LAB — LIPID PANEL
CHOL/HDL RATIO: 5
Cholesterol: 211 mg/dL — ABNORMAL HIGH (ref 0–200)
HDL: 41.7 mg/dL (ref >39.00)
LDL Cholesterol: 145 mg/dL — ABNORMAL HIGH (ref 0–99)
NONHDL: 169.31
Triglycerides: 121 mg/dL (ref 0.0–149.0)
VLDL: 24.2 mg/dL (ref 0.0–40.0)

## 2016-09-20 MED ORDER — CLONAZEPAM 1 MG PO TABS: 1.0000 mg | ORAL_TABLET | Freq: Two times a day (BID) | ORAL | 1 refills | Status: DC | PRN

## 2016-09-20 NOTE — Patient Instructions (Signed)
Thank you for choosing [facility].  SUMMARY AND INSTRUCTIONS:  Please increase the clonazepam as prescribed.  Recommend decreasing alcohol intake to minimal as possible.   Medication:  Please continue to take your medication as prescribed.   Your prescription(s) have been submitted to your pharmacy or been printed and provided for you. Please take as directed and contact our office if you believe you are having problem(s) with the medication(s) or have any questions.  Follow up:  If your symptoms worsen or fail to improve, please contact our office for further instruction, or in case of emergency go directly to the emergency room at the closest medical facility.

## 2016-09-20 NOTE — Assessment & Plan Note (Signed)
Blood pressure appears adequately controlled current medication regimen and no adverse side effects. Denies worst headache of life with no new symptoms of end organ damage noted on physical exam. Continue current dosage of amlodipine, lisinopril, and metoprolol. Encouraged to monitor blood pressure at home and follow low-sodium diet.

## 2016-09-20 NOTE — Progress Notes (Signed)
Subjective:    Patient ID: Tanner [name], male    DOB: 1955/12/06, 61 y.o.   MRN: [id number]  Chief Complaint  Patient presents with  . Follow-up    HPI:  Tanner [name] is a 61 y.o. male who  has a past medical history of Alcohol abuse; Alcoholic hepatitis; Allergy; Anxiety; Arthritis; C. difficile colitis; Depression; DVT (deep venous thrombosis) ([place]) (2015); GERD (gastroesophageal reflux disease); Hypertension; Ileus ([place]) (09/2013); Prostate cancer ([place]) (05/22/13); Shortness of breath; Substance abuse; and TIA (transient ischemic attack) (08/2013). and presents today for a follow up office visit.  1.) Hypertension - Currently maintained on  Amlodipine, lisinopril, and metoprolol and reports taking the medications as prescribed and denies adverse side effects or hypotensive readings. Blood pressures at home Denies changes in vision, worst headache of life or new symptoms of end organ damage. Working on following a low sodium diet. Physical activity level   BP Readings from Last 3 Encounters:  09/20/16 120/84  08/23/16 92/62  08/20/16 115/66    2.) Anxiety - Currently maintained on clonazepam and fluoxetine. Reports taking the medication as prescribed and denies adverse side effects. Notes that his symptoms remain labile at times and and is generally taking the medication on an everyday basis.    3.) Hyperlipidemia - previously prescribed simvastatin and not currently taking medication. Denies adverse side effects when taking medication or myalgias. Denies chest pain, shortness of breath, or paroxysmal nocturnal dyspnea.  Lab Results  Component Value Date   CHOL 211 (H) 09/20/2016   HDL 41.70 09/20/2016   LDLCALC 145 (H) 09/20/2016   TRIG 121.0 09/20/2016   CHOLHDL 5 09/20/2016     Allergies  Allergen Reactions  . Dilaudid [Hydromorphone Hcl] Nausea And Vomiting    Pre pt history  . Oxycodone Nausea And Vomiting  . Procaine Hcl Other (See Comments)    Patient states he is  allergic to novicaine  . Zofran [Ondansetron] Nausea And Vomiting  . Sulfa Antibiotics Rash      Outpatient Medications Prior to Visit  Medication Sig Dispense Refill  . amLODipine (NORVASC) 10 MG tablet Take 1 tablet (10 mg total) by mouth daily. 90 tablet 1  . FLUoxetine (PROZAC) 40 MG capsule TAKE 1 CAPSULE BY MOUTH DAILY 90 capsule 1  . lisinopril (PRINIVIL,ZESTRIL) 40 MG tablet TAKE ONE (1) TABLET BY MOUTH EVERY DAY 90 tablet 1  . metoprolol (LOPRESSOR) 100 MG tablet TAKE 1 TABLET BY MOUTH TWICE A DAY 60 tablet 0  . Multiple Vitamin (MULTIVITAMIN WITH MINERALS) TABS tablet Take 1 tablet by mouth daily. 30 tablet 0  . omeprazole (PRILOSEC) 40 MG capsule TAKE ONE CAPSULE BY MOUTH TWICE A DAY 180 capsule 1  . thiamine 100 MG tablet Take 1 tablet (100 mg total) by mouth daily. 30 tablet 0  . traZODone (DESYREL) 50 MG tablet Take 1 tablet (50 mg total) by mouth at bedtime as needed for sleep. 30 tablet 0  . clonazePAM (KLONOPIN) 0.5 MG tablet TAKE 1 TABLET BY MOUTH THREE TIMES A DAY AS NEEDED 90 tablet 0  . feeding supplement, ENSURE ENLIVE, (ENSURE ENLIVE) LIQD Take 237 mLs by mouth 2 (two) times daily between meals. 60 Bottle 0  . simvastatin (ZOCOR) 20 MG tablet TAKE ONE (1) TABLET BY MOUTH EVERY DAY (Patient not taking: Reported on 09/20/2016) 90 tablet 1  . docusate sodium (COLACE) 100 MG capsule Take 1 capsule (100 mg total) by mouth 3 (three) times daily as needed. 20 capsule 0  . oxyCODONE-acetaminophen (  ROXICET) 5-325 MG tablet Take 1-2 tablets by mouth every 4 (four) hours as needed for severe pain. (Patient not taking: Reported on 09/20/2016) 60 tablet 0  . rivaroxaban (XARELTO) 10 MG TABS tablet Take 1 tablet (10 mg total) by mouth at bedtime. Start today at 2200 hours (Patient not taking: Reported on 09/20/2016) 27 tablet 0   No facility-administered medications prior to visit.       Past Surgical History:  Procedure Laterality Date  . HAND SURGERY Right   . HERNIA REPAIR      . INTRAMEDULLARY (IM) NAIL INTERTROCHANTERIC Right 08/17/2016   Procedure: INTRAMEDULLARY (IM) NAIL RIGHT HIP;  Surgeon: Tania Ade, MD;  Location: [facility] [facility];  Service: Orthopedics;  Laterality: Right;  . LYMPHADENECTOMY Bilateral 08/29/2013   Procedure: LYMPHADENECTOMY "BILATERAL PELVIC LYMPH NODE DISSECTION";  Surgeon: Molli Hazard, MD;  [facility];  Service: Urology;  Laterality: Bilateral;  . ORIF ANKLE FRACTURE Right 12/10/2012   Procedure: OPEN REDUCTION INTERNAL FIXATION (ORIF) ANKLE FRACTURE;  Surgeon: Hessie Dibble, MD;  [facility];  Service: Orthopedics;  Laterality: Right;  . ROBOT ASSISTED LAPAROSCOPIC RADICAL PROSTATECTOMY N/A 08/29/2013   Procedure: ROBOTIC ASSISTED LAPAROSCOPIC RADICAL PROSTATECTOMY LEVEL 2, Lysis of adhesions;  Surgeon: Molli Hazard, MD;  [facility];  Service: Urology;  Laterality: N/A;      Past Medical History:  Diagnosis Date  . Alcohol abuse   . Alcoholic hepatitis   . Allergy   . Anxiety   . 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 C. difficile colitis   . Depression   . DVT (deep venous thrombosis) ([place]) 2015  . GERD (gastroesophageal reflux disease)   . Hypertension   . Ileus ([place]) 09/2013  . Prostate cancer ([place]) 05/22/13   Gleason 4+3=7  . Shortness of breath    new onset- occasionally-at rest and exertion  . Substance abuse   . TIA (transient ischemic attack) 08/2013      Review of Systems  Constitutional: Negative for chills and fever.  Eyes:       Negative for changes in vision  Respiratory: Negative for cough, chest tightness and wheezing.   Cardiovascular: Negative for chest pain, palpitations and leg swelling.  Gastrointestinal: Negative for abdominal pain, blood in stool, constipation, diarrhea, nausea and vomiting.  Neurological: Negative for dizziness, weakness and light-headedness.      Objective:    BP 120/84 (BP Location: Left Arm, Patient Position: Sitting, Cuff Size: Normal)   Pulse 70    Temp 98.2 F (36.8 C) (Oral)   Resp 16   Ht '5\' 10"'$  (1.778 m)   Wt 169 lb (76.7 kg)   SpO2 95%   BMI 24.25 kg/m  Nursing note and vital signs reviewed.  Physical Exam  Constitutional: He is oriented to person, place, and time. He appears well-developed and well-nourished. No distress.  Cardiovascular: Normal rate, regular rhythm, normal heart sounds and intact distal pulses.   Pulmonary/Chest: Effort normal and breath sounds normal.  Neurological: He is alert and oriented to person, place, and time.  Skin: Skin is warm and dry.  Psychiatric: He has a normal mood and affect. His behavior is normal. Judgment and thought content normal.       Assessment & Plan:   Problem List Items Addressed This Visit      Cardiovascular and Mediastinum   Essential hypertension - Primary    Blood pressure appears adequately controlled current medication regimen and no adverse side effects.  Denies worst headache of life with no new symptoms of end organ damage noted on physical exam. Continue current dosage of amlodipine, lisinopril, and metoprolol. Encouraged to monitor blood pressure at home and follow low-sodium diet.        Other   Generalized anxiety disorder    Anxiety remains labile with current medication regimen and no adverse side effects. Increase clonazepam. Continue current dosage of fluoxetine. Discussed importance of avoiding alcohol and benzodiazepine use. Recommend counseling which is declined by patient at this time. [place] controlled substance database reviewed with no irregularities. Continue to monitor.      Hyperlipidemia    Previously prescribed simvastatin and not currently taking medication. Obtain lipid profile to determine continued need. Given previous stroke LDL goal of less than 70. Encouraged to follow a nutritional intake that is low in saturated fats and processed/sugary foods. Continue to monitor pending lipid profile results.      Relevant Orders   Lipid  Profile (Completed)       I have discontinued Mr. Nunnelley's oxyCODONE-acetaminophen, docusate sodium, feeding supplement (ENSURE ENLIVE), rivaroxaban, and clonazePAM. I am also having him start on clonazePAM. Additionally, I am having him maintain his omeprazole, simvastatin, FLUoxetine, amLODipine, lisinopril, thiamine, multivitamin with minerals, traZODone, and metoprolol.   Meds ordered this encounter  Medications  . clonazePAM (KLONOPIN) 1 MG tablet    Sig: Take 1-2 tablets (1-2 mg total) by mouth 2 (two) times daily as needed for anxiety.    Dispense:  60 tablet    Refill:  1    Fill on or after 10/19/16    Order Specific Question:   Supervising Provider    Answer:   Pricilla Holm A [[id number]]     Follow-up: Return in about 3 months (around 12/21/2016).  Mauricio Po, [name]

## 2016-09-20 NOTE — Assessment & Plan Note (Signed)
Anxiety remains labile with current medication regimen and no adverse side effects. Increase clonazepam. Continue current dosage of fluoxetine. Discussed importance of avoiding alcohol and benzodiazepine use. Recommend counseling which is declined by patient at this time. [place] controlled substance database reviewed with no irregularities. Continue to monitor.

## 2016-09-20 NOTE — Assessment & Plan Note (Signed)
Previously prescribed simvastatin and not currently taking medication. Obtain lipid profile to determine continued need. Given previous stroke LDL goal of less than 70. Encouraged to follow a nutritional intake that is low in saturated fats and processed/sugary foods. Continue to monitor pending lipid profile results.

## 2016-09-22 MED ORDER — SIMVASTATIN 20 MG PO TABS: ORAL_TABLET | ORAL | 1 refills | Status: DC

## 2016-09-24 ENCOUNTER — Other Ambulatory Visit: Payer: Self-pay | Admitting: Family

## 2016-09-29 DIAGNOSIS — S72041D Displaced fracture of base of neck of right femur, subsequent encounter for closed fracture with routine healing: Secondary | ICD-10-CM | POA: Diagnosis not present

## 2016-09-30 ENCOUNTER — Other Ambulatory Visit: Payer: Self-pay | Admitting: Family

## 2016-10-11 ENCOUNTER — Ambulatory Visit: Payer: Medicare HMO | Attending: Orthopedic Surgery | Admitting: Physical Therapy

## 2016-10-12 ENCOUNTER — Other Ambulatory Visit: Payer: Self-pay | Admitting: Family

## 2016-10-13 DIAGNOSIS — I10 Essential (primary) hypertension: Secondary | ICD-10-CM | POA: Diagnosis not present

## 2016-10-13 DIAGNOSIS — Z0000 Encounter for general adult medical examination without abnormal findings: Secondary | ICD-10-CM | POA: Diagnosis not present

## 2016-10-13 DIAGNOSIS — K210 Gastro-esophageal reflux disease with esophagitis: Secondary | ICD-10-CM | POA: Diagnosis not present

## 2016-10-13 DIAGNOSIS — Z9079 Acquired absence of other genital organ(s): Secondary | ICD-10-CM | POA: Diagnosis not present

## 2016-10-13 DIAGNOSIS — Z79899 Other long term (current) drug therapy: Secondary | ICD-10-CM | POA: Diagnosis not present

## 2016-10-13 DIAGNOSIS — R69 Illness, unspecified: Secondary | ICD-10-CM | POA: Diagnosis not present

## 2016-10-13 DIAGNOSIS — Z9181 History of falling: Secondary | ICD-10-CM | POA: Diagnosis not present

## 2016-10-13 DIAGNOSIS — Z8781 Personal history of (healed) traumatic fracture: Secondary | ICD-10-CM | POA: Diagnosis not present

## 2016-10-13 DIAGNOSIS — Z8546 Personal history of malignant neoplasm of prostate: Secondary | ICD-10-CM | POA: Diagnosis not present

## 2016-10-13 DIAGNOSIS — H9113 Presbycusis, bilateral: Secondary | ICD-10-CM | POA: Diagnosis not present

## 2016-10-13 DIAGNOSIS — E785 Hyperlipidemia, unspecified: Secondary | ICD-10-CM | POA: Diagnosis not present

## 2016-10-13 DIAGNOSIS — R32 Unspecified urinary incontinence: Secondary | ICD-10-CM | POA: Diagnosis not present

## 2016-10-14 ENCOUNTER — Other Ambulatory Visit: Payer: Self-pay | Admitting: Family

## 2016-10-15 ENCOUNTER — Other Ambulatory Visit: Payer: Self-pay | Admitting: Family

## 2016-10-15 DIAGNOSIS — I10 Essential (primary) hypertension: Secondary | ICD-10-CM

## 2016-10-20 ENCOUNTER — Ambulatory Visit: Payer: Self-pay | Admitting: Family

## 2016-10-22 ENCOUNTER — Other Ambulatory Visit: Payer: Self-pay | Admitting: Family

## 2016-10-24 ENCOUNTER — Telehealth: Payer: Self-pay | Admitting: Physical Therapy

## 2016-10-24 ENCOUNTER — Ambulatory Visit: Payer: Medicare HMO | Admitting: Physical Therapy

## 2016-10-24 NOTE — Telephone Encounter (Signed)
10/11/16 no show for PT eval 10/24/16 pt cxl PT eval, did not reschedule

## 2016-11-08 DIAGNOSIS — S72041D Displaced fracture of base of neck of right femur, subsequent encounter for closed fracture with routine healing: Secondary | ICD-10-CM | POA: Diagnosis not present

## 2016-11-18 ENCOUNTER — Other Ambulatory Visit: Payer: Self-pay | Admitting: Family

## 2016-12-01 DIAGNOSIS — M25551 Pain in right hip: Secondary | ICD-10-CM | POA: Diagnosis not present

## 2016-12-12 ENCOUNTER — Other Ambulatory Visit: Payer: Self-pay | Admitting: Family

## 2016-12-14 ENCOUNTER — Other Ambulatory Visit: Payer: Self-pay | Admitting: Family

## 2016-12-14 NOTE — Telephone Encounter (Signed)
Last refill was 11/13/16 per [place] [facility]

## 2016-12-15 NOTE — Telephone Encounter (Signed)
[facility] 

## 2016-12-20 DIAGNOSIS — S32501A Unspecified fracture of right pubis, initial encounter for closed fracture: Secondary | ICD-10-CM | POA: Diagnosis not present

## 2016-12-20 DIAGNOSIS — M25551 Pain in right hip: Secondary | ICD-10-CM | POA: Diagnosis not present

## 2017-01-12 ENCOUNTER — Other Ambulatory Visit: Payer: Self-pay | Admitting: Family

## 2017-01-15 ENCOUNTER — Other Ambulatory Visit: Payer: Self-pay | Admitting: Family

## 2017-01-24 DIAGNOSIS — M25551 Pain in right hip: Secondary | ICD-10-CM | POA: Diagnosis not present

## 2017-02-02 ENCOUNTER — Telehealth: Payer: Self-pay

## 2017-02-02 NOTE — Telephone Encounter (Signed)
Patient is on the list for [employer] 2018 and may be a good candidate for an AWV. Please let me know if/when appt is scheduled.   

## 2017-02-02 NOTE — Telephone Encounter (Signed)
Spoke with pt to schedule AWV.  Pt scheduled AWV and CPE for 03/05/17.

## 2017-02-11 ENCOUNTER — Other Ambulatory Visit: Payer: Self-pay | Admitting: Family

## 2017-02-13 ENCOUNTER — Other Ambulatory Visit: Payer: Self-pay | Admitting: Family

## 2017-02-14 NOTE — Telephone Encounter (Signed)
Last refill was 01/15/17 per [place] [facility]

## 2017-02-15 NOTE — Telephone Encounter (Signed)
Rx faxed

## 2017-03-02 NOTE — Progress Notes (Deleted)
Pre visit review using our clinic review tool, if applicable. No additional management support is needed unless otherwise documented below in the visit note. 

## 2017-03-02 NOTE — Progress Notes (Deleted)
Subjective:   Tanner [name] is a 61 y.o. male who presents for Medicare Annual/Subsequent preventive examination.  Review of Systems:  No ROS.  Medicare Wellness Visit. Additional risk factors are reflected in the social history.    Sleep patterns: {SX; SLEEP PATTERNS:18802::"feels rested on waking","does not get up to void","gets up *** times nightly to void","sleeps *** hours nightly"}.    Home Safety/Smoke Alarms: Feels safe in home. Smoke alarms in place.  Living environment; residence and Firearm Safety: {Rehab home environment / accessibility:30080::"no firearms","firearms stored safely"}. Seat Belt Safety/Bike Helmet: Wears seat belt.  Male:   CCS-     PSA-  Lab Results  Component Value Date   PSA 0.00 (L) 08/23/2016   PSA 0.00 (L) 05/24/2015       Objective:    Vitals: There were no vitals taken for this visit.  There is no height or weight on file to calculate BMI.  Tobacco History  Smoking Status  . Current Every Day Smoker  . Packs/day: 1.50  . Years: 30.00  Smokeless Tobacco  . Never Used     Ready to quit: Not Answered Counseling given: Not Answered   Past Medical History:  Diagnosis Date  . Alcohol abuse   . Alcoholic hepatitis   . Allergy   . Anxiety   . 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 C. difficile colitis   . Depression   . DVT (deep venous thrombosis) ([facility]) 2015  . GERD (gastroesophageal reflux disease)   . Hypertension   . Ileus ([facility]) 09/2013  . 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 Shortness of breath    new onset- occasionally-at rest and exertion  . Substance abuse   . TIA (transient ischemic attack) 08/2013   Past Surgical History:  Procedure Laterality Date  . HAND SURGERY Right   . HERNIA REPAIR    . INTRAMEDULLARY (IM) NAIL INTERTROCHANTERIC Right 08/17/2016   Procedure: INTRAMEDULLARY (IM) NAIL RIGHT HIP;  Surgeon: Tania Ade, MD;  Location: [facility];  Service: Orthopedics;  Laterality: Right;  .  LYMPHADENECTOMY Bilateral 08/29/2013   Procedure: LYMPHADENECTOMY "BILATERAL PELVIC LYMPH NODE DISSECTION";  Surgeon: Molli Hazard, MD;  [facility];  Service: Urology;  Laterality: Bilateral;  . ORIF ANKLE FRACTURE Right 12/10/2012   Procedure: OPEN REDUCTION INTERNAL FIXATION (ORIF) ANKLE FRACTURE;  Surgeon: Hessie Dibble, MD;  [facility];  Service: Orthopedics;  Laterality: Right;  . ROBOT ASSISTED LAPAROSCOPIC RADICAL PROSTATECTOMY N/A 08/29/2013   Procedure: ROBOTIC ASSISTED LAPAROSCOPIC RADICAL PROSTATECTOMY LEVEL 2, Lysis of adhesions;  Surgeon: Molli Hazard, MD;  [facility];  Service: Urology;  Laterality: N/A;   Family History  Problem Relation Age of Onset  . Lung cancer Father   . Arthritis Mother   . Pancreatic cancer Sister    History  Sexual Activity  . Sexual activity: Not on file    Outpatient Encounter Prescriptions as of 03/05/2017  Medication Sig  . amLODipine (NORVASC) 10 MG tablet Take 1 tablet (10 mg total) by mouth daily.  . clonazePAM (KLONOPIN) 1 MG tablet TAKE 1 TO 2 TABLETS BY MOUTH TWICE A DAY AS NEEDED FOR ANXIETY  . FLUoxetine (PROZAC) 40 MG capsule TAKE 1 CAPSULE BY MOUTH EVERY DAY  . FLUoxetine (PROZAC) 40 MG capsule TAKE 1 CAPSULE BY MOUTH EVERY DAY  . FLUoxetine (PROZAC) 40 MG capsule TAKE 1 CAPSULE BY MOUTH EVERY DAY  . lisinopril (PRINIVIL,ZESTRIL) 40 MG tablet TAKE ONE (  1) TABLET BY MOUTH EVERY DAY  . metoprolol (LOPRESSOR) 100 MG tablet TAKE 1 TABLET BY MOUTH TWICE A DAY  . Multiple Vitamin (MULTIVITAMIN WITH MINERALS) TABS tablet Take 1 tablet by mouth daily.  Marland Kitchen omeprazole (PRILOSEC) 40 MG capsule TAKE 1 CAPSULE BY MOUTH TWICE A DAY  . simvastatin (ZOCOR) 20 MG tablet TAKE ONE (1) TABLET BY MOUTH EVERY DAY  . thiamine 100 MG tablet Take 1 tablet (100 mg total) by mouth daily.  . traZODone (DESYREL) 50 MG tablet TAKE 1 TABLET BY MOUTH AT BEDTIME AS NEEDED FOR SLEEP   No facility-administered encounter medications  on file as of 03/05/2017.     Activities of Daily Living In your present state of health, do you have any difficulty performing the following activities: 08/19/2016  Hearing? N  Vision? N  Difficulty concentrating or making decisions? N  Walking or climbing stairs? N  Dressing or bathing? N  Doing errands, shopping? N  Some recent data might be hidden    Patient Care Team: [name], [name] as PCP - General (Family Medicine)   Assessment:    Physical assessment deferred to PCP.  Exercise Activities and Dietary recommendations   Diet (meal preparation, eat out, water intake, caffeinated beverages, dairy products, fruits and vegetables): {Desc; diets:16563}   Goals    None     Fall Risk Fall Risk  05/24/2015  Falls in the past year? Yes  Number falls in past yr: 1  Injury with Fall? Yes  Risk Factor Category  High Fall Risk  Risk for fall due to : History of fall(s)  Follow up Education provided;Falls evaluation completed   Depression Screen PHQ 2/9 Scores 05/24/2015  PHQ - 2 Score 0    Cognitive Function         There is no immunization history on file for this patient. Screening Tests Health Maintenance  Topic Date Due  . HIV Screening  06/16/1973  . TETANUS/TDAP  06/16/1977  . INFLUENZA VACCINE  02/07/2017  . COLONOSCOPY  03/30/2019  . Hepatitis C Screening  Completed      Plan:    I have personally reviewed and noted the following in the patient's chart:   . Medical and social history . Use of alcohol, tobacco or illicit drugs  . Current medications and supplements . Functional ability and status . Nutritional status . Physical activity . Advanced directives . List of other physicians . Vitals . Screenings to include cognitive, depression, and falls . Referrals and appointments  In addition, I have reviewed and discussed with patient certain preventive protocols, quality metrics, and best practice recommendations. A written personalized  care plan for preventive services as well as general preventive health recommendations were provided to patient.     Michiel Cowboy, RN  03/02/2017

## 2017-03-05 ENCOUNTER — Encounter: Payer: Self-pay | Admitting: Family

## 2017-03-07 DIAGNOSIS — M25551 Pain in right hip: Secondary | ICD-10-CM | POA: Diagnosis not present

## 2017-03-12 ENCOUNTER — Other Ambulatory Visit: Payer: Self-pay | Admitting: Family

## 2017-03-13 ENCOUNTER — Other Ambulatory Visit: Payer: Self-pay | Admitting: Family

## 2017-04-01 ENCOUNTER — Telehealth: Payer: Self-pay | Admitting: Family

## 2017-04-01 DIAGNOSIS — I10 Essential (primary) hypertension: Secondary | ICD-10-CM

## 2017-04-02 ENCOUNTER — Other Ambulatory Visit: Payer: Self-pay | Admitting: Family

## 2017-04-02 DIAGNOSIS — I10 Essential (primary) hypertension: Secondary | ICD-10-CM

## 2017-04-02 NOTE — Telephone Encounter (Signed)
Patient has set up an appointment for Oct 30 @ 11am.

## 2017-04-02 NOTE — Telephone Encounter (Signed)
Plz advise must sched appt first/Last seen on 3.14.18 told to F/U in 3 months then no-showed on 8.27.18/thx dmf

## 2017-04-13 ENCOUNTER — Other Ambulatory Visit: Payer: Self-pay | Admitting: Family

## 2017-04-14 ENCOUNTER — Other Ambulatory Visit: Payer: Self-pay | Admitting: Family

## 2017-04-18 ENCOUNTER — Other Ambulatory Visit: Payer: Self-pay | Admitting: Family

## 2017-04-18 NOTE — Telephone Encounter (Signed)
Last refill was 03/14/17 per [facility]

## 2017-04-19 ENCOUNTER — Other Ambulatory Visit: Payer: Self-pay | Admitting: Family

## 2017-04-23 NOTE — Telephone Encounter (Signed)
Pls advise on refill. Pt has made appt for 05/08/17 Check [place] registry last filled 03/14/2017...Johny Chess

## 2017-04-23 NOTE — Telephone Encounter (Signed)
Pt called and needs his clonazePAM (KLONOPIN) 1 MG tablet  Resent to [facility] on [address], his pharmacy had water damage and the roof collapsed,  Please advise

## 2017-04-24 NOTE — Telephone Encounter (Signed)
Medication has been called in to pharmacy.

## 2017-05-02 ENCOUNTER — Encounter (HOSPITAL_COMMUNITY): Payer: Self-pay | Admitting: *Deleted

## 2017-05-02 ENCOUNTER — Inpatient Hospital Stay (HOSPITAL_COMMUNITY)
Admission: EM | Admit: 2017-05-02 | Discharge: 2017-05-05 | DRG: 640 | Disposition: A | Discharge status: Home / Self Care | Payer: Medicare HMO | Attending: Internal Medicine | Admitting: Internal Medicine

## 2017-05-02 ENCOUNTER — Emergency Department (HOSPITAL_COMMUNITY): Payer: Medicare HMO

## 2017-05-02 DIAGNOSIS — Z8546 Personal history of malignant neoplasm of prostate: Secondary | ICD-10-CM

## 2017-05-02 DIAGNOSIS — R112 Nausea with vomiting, unspecified: Secondary | ICD-10-CM | POA: Diagnosis not present

## 2017-05-02 DIAGNOSIS — Z8673 Personal history of transient ischemic attack (TIA), and cerebral infarction without residual deficits: Secondary | ICD-10-CM

## 2017-05-02 DIAGNOSIS — E871 Hypo-osmolality and hyponatremia: Principal | ICD-10-CM | POA: Diagnosis present

## 2017-05-02 DIAGNOSIS — R197 Diarrhea, unspecified: Secondary | ICD-10-CM | POA: Diagnosis present

## 2017-05-02 DIAGNOSIS — R55 Syncope and collapse: Secondary | ICD-10-CM | POA: Diagnosis not present

## 2017-05-02 DIAGNOSIS — F419 Anxiety disorder, unspecified: Secondary | ICD-10-CM | POA: Diagnosis present

## 2017-05-02 DIAGNOSIS — R0789 Other chest pain: Secondary | ICD-10-CM | POA: Diagnosis not present

## 2017-05-02 DIAGNOSIS — K219 Gastro-esophageal reflux disease without esophagitis: Secondary | ICD-10-CM | POA: Diagnosis present

## 2017-05-02 DIAGNOSIS — Z800 Family history of malignant neoplasm of digestive organs: Secondary | ICD-10-CM

## 2017-05-02 DIAGNOSIS — Z9079 Acquired absence of other genital organ(s): Secondary | ICD-10-CM

## 2017-05-02 DIAGNOSIS — C61 Malignant neoplasm of prostate: Secondary | ICD-10-CM | POA: Diagnosis not present

## 2017-05-02 DIAGNOSIS — C7951 Secondary malignant neoplasm of bone: Secondary | ICD-10-CM | POA: Diagnosis present

## 2017-05-02 DIAGNOSIS — I10 Essential (primary) hypertension: Secondary | ICD-10-CM | POA: Diagnosis present

## 2017-05-02 DIAGNOSIS — F1020 Alcohol dependence, uncomplicated: Secondary | ICD-10-CM | POA: Diagnosis present

## 2017-05-02 DIAGNOSIS — F17200 Nicotine dependence, unspecified, uncomplicated: Secondary | ICD-10-CM | POA: Diagnosis present

## 2017-05-02 DIAGNOSIS — Z86718 Personal history of other venous thrombosis and embolism: Secondary | ICD-10-CM

## 2017-05-02 DIAGNOSIS — J189 Pneumonia, unspecified organism: Secondary | ICD-10-CM | POA: Diagnosis present

## 2017-05-02 DIAGNOSIS — F10239 Alcohol dependence with withdrawal, unspecified: Secondary | ICD-10-CM | POA: Diagnosis present

## 2017-05-02 DIAGNOSIS — E860 Dehydration: Secondary | ICD-10-CM | POA: Diagnosis present

## 2017-05-02 DIAGNOSIS — Z801 Family history of malignant neoplasm of trachea, bronchus and lung: Secondary | ICD-10-CM

## 2017-05-02 DIAGNOSIS — R404 Transient alteration of awareness: Secondary | ICD-10-CM | POA: Diagnosis not present

## 2017-05-02 DIAGNOSIS — E785 Hyperlipidemia, unspecified: Secondary | ICD-10-CM | POA: Diagnosis present

## 2017-05-02 DIAGNOSIS — R918 Other nonspecific abnormal finding of lung field: Secondary | ICD-10-CM | POA: Diagnosis present

## 2017-05-02 DIAGNOSIS — F17210 Nicotine dependence, cigarettes, uncomplicated: Secondary | ICD-10-CM | POA: Diagnosis present

## 2017-05-02 LAB — CBC
HCT: 35.6 % — ABNORMAL LOW (ref 39.0–52.0)
HEMOGLOBIN: 12.7 g/dL — AB (ref 13.0–17.0)
MCH: 34.1 pg — AB (ref 26.0–34.0)
MCHC: 35.7 g/dL (ref 30.0–36.0)
MCV: 95.7 fL (ref 78.0–100.0)
PLATELETS: 261 10*3/uL (ref 150–400)
RBC: 3.72 MIL/uL — AB (ref 4.22–5.81)
RDW: 12.7 % (ref 11.5–15.5)
WBC: 10.5 10*3/uL (ref 4.0–10.5)

## 2017-05-02 LAB — BASIC METABOLIC PANEL
ANION GAP: 13 (ref 5–15)
BUN: 9 mg/dL (ref 6–20)
CALCIUM: 8.4 mg/dL — AB (ref 8.9–10.3)
CHLORIDE: 87 mmol/L — AB (ref 101–111)
CO2: 20 mmol/L — AB (ref 22–32)
Creatinine, Ser: 0.94 mg/dL (ref 0.61–1.24)
GFR calc non Af Amer: >60 mL/min (ref >60)
Glucose, Bld: 74 mg/dL (ref 65–99)
Potassium: 3.8 mmol/L (ref 3.5–5.1)
SODIUM: 120 mmol/L — AB (ref 135–145)

## 2017-05-02 LAB — URINALYSIS, ROUTINE W REFLEX MICROSCOPIC
Bilirubin Urine: NEGATIVE
Glucose, UA: NEGATIVE mg/dL
HGB URINE DIPSTICK: NEGATIVE
Ketones, ur: NEGATIVE mg/dL
LEUKOCYTES UA: NEGATIVE
Nitrite: NEGATIVE
PROTEIN: NEGATIVE mg/dL
SPECIFIC GRAVITY, URINE: 1.01 (ref 1.005–1.030)
pH: 6 (ref 5.0–8.0)

## 2017-05-02 LAB — D-DIMER, QUANTITATIVE (NOT AT ARMC): D DIMER QUANT: 2.4 ug{FEU}/mL — AB (ref 0.00–0.50)

## 2017-05-02 LAB — I-STAT TROPONIN, ED: TROPONIN I, POC: 0.01 ng/mL (ref 0.00–0.08)

## 2017-05-02 MED ORDER — SODIUM CHLORIDE 0.9 % IV BOLUS (SEPSIS)
1000.0000 mL | Freq: Once | INTRAVENOUS | Status: AC
  Administered 2017-05-02: 1000 mL via INTRAVENOUS

## 2017-05-02 NOTE — ED Notes (Signed)
Orthostatic VS done. Pt reported feeling weak and nauseated when standing up. Pt has some wheezing, states that he had this "some" before this episode.  Pt arrived with lots of vomit in his hair and back, washed this off and asked pt if he feels that he could have inhaled any of the vomit, pt is unsure.

## 2017-05-02 NOTE — ED Provider Notes (Signed)
[facility] EMERGENCY DEPARTMENT Provider Note   CSN: [id number] Arrival date & time: 05/02/17  2214     History   Chief Complaint Chief Complaint  Patient presents with  . Near Syncope    HPI Tanner [name] is a 61 y.o. male.  Patient with past medical history remarkable for TIA, DVT, hypertension, presents to the emergency department with chief complaint of syncope.  Patient states that this evening he began feeling lightheaded, nauseated, and like he was going to pass out.  He reports of feeling of chest tightness and shortness of breath.  He denies any fever, chills, or cough.  He states that he has felt like this twice in the past, but ignored the symptoms.  He denies having taken anything for his symptoms except for "something for nausea" in triage.  He denies any new numbness, weakness, or tingling of his extremities.  Denies any slurred speech or vision changes.  Denies any recent long travel, recent surgery, or leg swelling.   The history is provided by the patient. No language interpreter was used.    Past Medical History:  Diagnosis Date  . Alcohol abuse   . Alcoholic hepatitis   . Allergy   . Anxiety   . 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 C. difficile colitis   . Depression   . DVT (deep venous thrombosis) ([facility]) 2015  . GERD (gastroesophageal reflux disease)   . Hypertension   . Ileus ([facility]) 09/2013  . 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 Shortness of breath    new onset- occasionally-at rest and exertion  . Substance abuse ([facility])   . TIA (transient ischemic attack) 08/2013    Patient Active Problem List   Diagnosis Date Noted  . Hyperlipidemia 09/20/2016  . Protein-calorie malnutrition ([facility]) 08/17/2016  . Thoracic compression fracture, closed, initial encounter ([facility]) 08/17/2016  . Alcohol dependence ([facility]) 08/17/2016  . Tobacco dependence 08/17/2016  . Osteoporosis 08/17/2016  . Hip fracture ([facility]) 08/16/2016  . Headache  02/24/2016  . Hypertensive urgency 02/03/2016  . Shortness of breath 07/28/2015  . Generalized anxiety disorder 07/28/2015  . Medicare annual wellness visit, subsequent 05/24/2015  . Acute DVT of right tibial vein ([facility]) 02/27/2015  . Tibia fracture 02/20/2015  . Syncope 02/18/2015  . Essential hypertension 02/18/2015  . History of stroke 02/18/2015  . Fracture of tibia, right, closed   . C. difficile colitis 09/11/2013  . UTI (urinary tract infection) 09/11/2013  . Essential hypertension, benign 09/11/2013  . Ileus ([facility]) 09/10/2013  . 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 Ileus, postoperative ([facility]) 09/09/2013  . Hypokalemia 09/09/2013  . Nonspecific (abnormal) findings on radiological and other examination of gastrointestinal tract 09/09/2013  . CVA (cerebral infarction) 09/05/2013  . 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 Malignant neoplasm of prostate ([facility]) 08/29/2013  . Hyponatremia 12/11/2012    Class: Chronic  . Ankle fracture 12/10/2012    Past Surgical History:  Procedure Laterality Date  . HAND SURGERY Right   . HERNIA REPAIR    . INTRAMEDULLARY (IM) NAIL INTERTROCHANTERIC Right 08/17/2016   Procedure: INTRAMEDULLARY (IM) NAIL RIGHT HIP;  Surgeon: Tania Ade, MD;  Location: [facility];  Service: Orthopedics;  Laterality: Right;  . LYMPHADENECTOMY Bilateral 08/29/2013   Procedure: LYMPHADENECTOMY "BILATERAL PELVIC LYMPH NODE DISSECTION";  Surgeon: Molli Hazard, MD;  [facility];  Service: Urology;  Laterality: Bilateral;  . ORIF ANKLE FRACTURE Right 12/10/2012  Procedure: OPEN REDUCTION INTERNAL FIXATION (ORIF) ANKLE FRACTURE;  Surgeon: Hessie Dibble, MD;  [facility];  Service: Orthopedics;  Laterality: Right;  . ROBOT ASSISTED LAPAROSCOPIC RADICAL PROSTATECTOMY N/A 08/29/2013   Procedure: ROBOTIC ASSISTED LAPAROSCOPIC RADICAL PROSTATECTOMY LEVEL 2, Lysis of adhesions;  Surgeon: Molli Hazard, MD;  [facility];  Service: Urology;  Laterality: N/A;       Home Medications    Prior to Admission medications   Medication Sig Start Date End Date Taking? Authorizing Provider  amLODipine (NORVASC) 10 MG tablet Take 1 tablet (10 mg total) by mouth daily. 08/16/16   [name], [name]  clonazePAM (KLONOPIN) 1 MG tablet TAKE 1-2 TABLETS TWICE DAILY AS NEEDED FOR ANXIETY 04/24/17   [name], [name]  FLUoxetine (PROZAC) 40 MG capsule TAKE 1 CAPSULE BY MOUTH EVERY DAY 10/17/16   [name], [name]  FLUoxetine (PROZAC) 40 MG capsule TAKE 1 CAPSULE BY MOUTH EVERY DAY 01/12/17   [name], [name]  FLUoxetine (PROZAC) 40 MG capsule TAKE 1 CAPSULE BY MOUTH EVERY DAY 01/16/17   [name], [name]  lisinopril (PRINIVIL,ZESTRIL) 40 MG tablet TAKE ONE (1) TABLET BY MOUTH EVERY DAY 08/16/16   [name], [name]  metoprolol tartrate (LOPRESSOR) 100 MG tablet TAKE 1 TABLET BY MOUTH TWICE A DAY 04/03/17   [name], [name]  Multiple Vitamin (MULTIVITAMIN WITH MINERALS) TABS tablet Take 1 tablet by mouth daily. 08/21/16   Ghimire, Henreitta Leber, MD  omeprazole (PRILOSEC) 40 MG capsule TAKE 1 CAPSULE BY MOUTH TWICE A DAY 04/13/17   [name], [name]  omeprazole (PRILOSEC) 40 MG capsule TAKE 1 CAPSULE BY MOUTH TWICE A DAY 04/16/17   [name], [name]  simvastatin (ZOCOR) 20 MG tablet TAKE ONE (1) TABLET BY MOUTH EVERY DAY 03/13/17   [name], [name]  simvastatin (ZOCOR) 20 MG tablet TAKE ONE (1) TABLET BY MOUTH EVERY DAY 03/14/17   [name], [name]  thiamine 100 MG tablet Take 1 tablet (100 mg total) by mouth daily. 08/21/16   Ghimire, Henreitta Leber, MD  traZODone (DESYREL) 50 MG tablet TAKE 1 TABLET BY MOUTH AT BEDTIME AS NEEDED FOR SLEEP 11/20/16   [name], [name]    Family History Family History  Problem Relation Age of Onset  . Lung cancer Father   . Arthritis Mother   . Pancreatic cancer Sister     Social History Social History  Substance Use Topics  . Smoking status:  Current Every Day Smoker    Packs/day: 1.50    Years: 30.00  . Smokeless tobacco: Never Used  . Alcohol use 1.8 - 3.6 oz/week    3 - 6 Cans of beer per week     Allergies   Dilaudid [hydromorphone hcl]; Oxycodone; Procaine hcl; Zofran [ondansetron]; and Sulfa antibiotics   Review of Systems Review of Systems  All other systems reviewed and are negative.    Physical Exam Updated Vital Signs There were no vitals taken for this visit.  Physical Exam  Constitutional: He is oriented to person, place, and time. He appears well-developed and well-nourished.  HENT:  Head: Normocephalic and atraumatic.  Eyes: Pupils are equal, round, and reactive to light. Conjunctivae and EOM are normal. Right eye exhibits no discharge. Left eye exhibits no discharge. No scleral icterus.  Neck: Normal range of motion. Neck supple. No JVD present.  Cardiovascular: Normal rate, regular rhythm and normal heart sounds.  Exam reveals no gallop  and no friction rub.   No murmur heard. Pulmonary/Chest: Effort normal and breath sounds normal. No respiratory distress. He has no wheezes. He has no rales. He exhibits no tenderness.  Abdominal: Soft. He exhibits no distension and no mass. There is no tenderness. There is no rebound and no guarding.  Musculoskeletal: Normal range of motion. He exhibits no edema or tenderness.  Neurological: He is alert and oriented to person, place, and time.  Skin: Skin is warm and dry.  Psychiatric: He has a normal mood and affect. His behavior is normal. Judgment and thought content normal.  Nursing note and vitals reviewed.    ED Treatments / Results  Labs (all labs ordered are listed, but only abnormal results are displayed) Labs Reviewed  BASIC METABOLIC PANEL - Abnormal; Notable for the following:       Result Value   Sodium 120 (*)    Chloride 87 (*)    CO2 20 (*)    Calcium 8.4 (*)    All other components within normal limits  CBC - Abnormal; Notable for the  following:    RBC 3.72 (*)    Hemoglobin 12.7 (*)    HCT 35.6 (*)    MCH 34.1 (*)    All other components within normal limits  URINALYSIS, ROUTINE W REFLEX MICROSCOPIC - Abnormal; Notable for the following:    Color, Urine STRAW (*)    All other components within normal limits  D-DIMER, QUANTITATIVE (NOT AT [facility]) - Abnormal; Notable for the following:    D-Dimer, Quant 2.40 (*)    All other components within normal limits  ETHANOL - Abnormal; Notable for the following:    Alcohol, Ethyl (B) 45 (*)    All other components within normal limits  CULTURE, BLOOD (ROUTINE X 2)  CULTURE, BLOOD (ROUTINE X 2)  SODIUM, URINE, RANDOM  OSMOLALITY, URINE  I-STAT TROPONIN, ED    EKG  EKG Interpretation  Date/Time:  Wednesday May 02 2017 22:20:27 EDT Ventricular Rate:  71 PR Interval:    QRS Duration: 97 QT Interval:  414 QTC Calculation: 450 R Axis:   75 Text Interpretation:  Sinus rhythm New ST depression, anterolateral leads No STEMI.  Confirmed by Nanda Quinton [id number]) on 05/02/2017 10:23:25 PM       Radiology No results found.  Procedures Procedures (including critical care time)  Medications Ordered in ED Medications  sodium chloride 0.9 % bolus 1,000 mL (not administered)     Initial Impression / Assessment and Plan / ED Course  I have reviewed the triage vital signs and the nursing notes.  Pertinent labs & imaging results that were available during my care of the patient were reviewed by me and considered in my medical decision making (see chart for details).     Patient with near syncope versus syncope, lightheadedness, chest tightness, shortness of breath, nausea, and vomiting.  Onset was this evening. Will check labs and imaging.  Patient does have some new EKG changes, but no STEMI.  Patient does have a history of DVT, given constellation of symptoms, patient may benefit from a D-dimer to rule out PE.  Patient is not tachycardic, but he does take metoprolol.     D-dimer is 2.4, will proceed with CT PE study.  CT PE study is negative for pulmonary embolus.  There is some evidence of infection and also possible bronchogenic malignancy.  Patient has not had a fever.  I discussed the case with Dr. Betsey Holiday, who agrees with plan  for admission given electrolyte abnormalities.  Thought to be secondary to his nausea, vomiting, diarrhea.  The patient does not have an elevated anion gap.  Doubt alcoholic ketoacidosis.    Appreciate [name], who will admit the patient.  Final Clinical Impressions(s) / ED Diagnoses   Final diagnoses:  Near syncope    New Prescriptions New Prescriptions   No medications on file     [place], [place], Hershal Coria 05/03/17 0310    Orpah Greek, MD 05/03/17 [phone]

## 2017-05-02 NOTE — ED Triage Notes (Signed)
Pt is here for near syncopal episode.  Pt reports that he broke out in a cold sweat, became nauseated and had multiple episodes of vomiting and nearly passed out.  Pt was lowered to the ground.  He arrives reporting that he feels light headed, has some mild sob and some chest pressure.

## 2017-05-03 ENCOUNTER — Emergency Department (HOSPITAL_COMMUNITY): Payer: Medicare HMO

## 2017-05-03 ENCOUNTER — Observation Stay (HOSPITAL_BASED_OUTPATIENT_CLINIC_OR_DEPARTMENT_OTHER): Payer: Medicare HMO

## 2017-05-03 ENCOUNTER — Inpatient Hospital Stay (HOSPITAL_COMMUNITY): Payer: Medicare HMO

## 2017-05-03 ENCOUNTER — Encounter (HOSPITAL_COMMUNITY): Payer: Self-pay | Admitting: Emergency Medicine

## 2017-05-03 DIAGNOSIS — Z8546 Personal history of malignant neoplasm of prostate: Secondary | ICD-10-CM | POA: Diagnosis not present

## 2017-05-03 DIAGNOSIS — E860 Dehydration: Secondary | ICD-10-CM | POA: Diagnosis present

## 2017-05-03 DIAGNOSIS — C61 Malignant neoplasm of prostate: Secondary | ICD-10-CM | POA: Diagnosis not present

## 2017-05-03 DIAGNOSIS — R69 Illness, unspecified: Secondary | ICD-10-CM | POA: Diagnosis not present

## 2017-05-03 DIAGNOSIS — K219 Gastro-esophageal reflux disease without esophagitis: Secondary | ICD-10-CM | POA: Diagnosis present

## 2017-05-03 DIAGNOSIS — F1020 Alcohol dependence, uncomplicated: Secondary | ICD-10-CM | POA: Diagnosis not present

## 2017-05-03 DIAGNOSIS — Z8673 Personal history of transient ischemic attack (TIA), and cerebral infarction without residual deficits: Secondary | ICD-10-CM | POA: Diagnosis not present

## 2017-05-03 DIAGNOSIS — F17210 Nicotine dependence, cigarettes, uncomplicated: Secondary | ICD-10-CM | POA: Diagnosis present

## 2017-05-03 DIAGNOSIS — R55 Syncope and collapse: Secondary | ICD-10-CM

## 2017-05-03 DIAGNOSIS — C7951 Secondary malignant neoplasm of bone: Secondary | ICD-10-CM | POA: Diagnosis not present

## 2017-05-03 DIAGNOSIS — Z801 Family history of malignant neoplasm of trachea, bronchus and lung: Secondary | ICD-10-CM | POA: Diagnosis not present

## 2017-05-03 DIAGNOSIS — F419 Anxiety disorder, unspecified: Secondary | ICD-10-CM | POA: Diagnosis present

## 2017-05-03 DIAGNOSIS — J189 Pneumonia, unspecified organism: Secondary | ICD-10-CM | POA: Diagnosis not present

## 2017-05-03 DIAGNOSIS — F1029 Alcohol dependence with unspecified alcohol-induced disorder: Secondary | ICD-10-CM

## 2017-05-03 DIAGNOSIS — R197 Diarrhea, unspecified: Secondary | ICD-10-CM | POA: Diagnosis present

## 2017-05-03 DIAGNOSIS — Z800 Family history of malignant neoplasm of digestive organs: Secondary | ICD-10-CM | POA: Diagnosis not present

## 2017-05-03 DIAGNOSIS — R918 Other nonspecific abnormal finding of lung field: Secondary | ICD-10-CM | POA: Diagnosis present

## 2017-05-03 DIAGNOSIS — F10239 Alcohol dependence with withdrawal, unspecified: Secondary | ICD-10-CM | POA: Diagnosis present

## 2017-05-03 DIAGNOSIS — R109 Unspecified abdominal pain: Secondary | ICD-10-CM | POA: Diagnosis not present

## 2017-05-03 DIAGNOSIS — Z9079 Acquired absence of other genital organ(s): Secondary | ICD-10-CM | POA: Diagnosis not present

## 2017-05-03 DIAGNOSIS — I10 Essential (primary) hypertension: Secondary | ICD-10-CM | POA: Diagnosis not present

## 2017-05-03 DIAGNOSIS — E871 Hypo-osmolality and hyponatremia: Principal | ICD-10-CM

## 2017-05-03 DIAGNOSIS — R0602 Shortness of breath: Secondary | ICD-10-CM | POA: Diagnosis not present

## 2017-05-03 DIAGNOSIS — Z86718 Personal history of other venous thrombosis and embolism: Secondary | ICD-10-CM | POA: Diagnosis not present

## 2017-05-03 DIAGNOSIS — E785 Hyperlipidemia, unspecified: Secondary | ICD-10-CM | POA: Diagnosis present

## 2017-05-03 DIAGNOSIS — S24109A Unspecified injury at unspecified level of thoracic spinal cord, initial encounter: Secondary | ICD-10-CM | POA: Diagnosis not present

## 2017-05-03 LAB — BASIC METABOLIC PANEL
Anion gap: 11 (ref 5–15)
Anion gap: 11 (ref 5–15)
Anion gap: 10 (ref 5–15)
BUN: 7 mg/dL (ref 6–20)
BUN: 7 mg/dL (ref 6–20)
BUN: 5 mg/dL — ABNORMAL LOW (ref 6–20)
CALCIUM: 7.7 mg/dL — AB (ref 8.9–10.3)
CALCIUM: 8 mg/dL — AB (ref 8.9–10.3)
CALCIUM: 8.4 mg/dL — AB (ref 8.9–10.3)
CO2: 22 mmol/L (ref 22–32)
CO2: 23 mmol/L (ref 22–32)
CO2: 23 mmol/L (ref 22–32)
CREATININE: 0.75 mg/dL (ref 0.61–1.24)
CREATININE: 0.72 mg/dL (ref 0.61–1.24)
CREATININE: 0.73 mg/dL (ref 0.61–1.24)
Chloride: 90 mmol/L — ABNORMAL LOW (ref 101–111)
Chloride: 90 mmol/L — ABNORMAL LOW (ref 101–111)
Chloride: 93 mmol/L — ABNORMAL LOW (ref 101–111)
GFR calc non Af Amer: >60 mL/min (ref >60)
GFR calc non Af Amer: >60 mL/min (ref >60)
GLUCOSE: 105 mg/dL — AB (ref 65–99)
GLUCOSE: 97 mg/dL (ref 65–99)
Glucose, Bld: 191 mg/dL — ABNORMAL HIGH (ref 65–99)
Potassium: 3.8 mmol/L (ref 3.5–5.1)
Potassium: 3.7 mmol/L (ref 3.5–5.1)
Potassium: 3.6 mmol/L (ref 3.5–5.1)
Sodium: 123 mmol/L — ABNORMAL LOW (ref 135–145)
Sodium: 124 mmol/L — ABNORMAL LOW (ref 135–145)
Sodium: 126 mmol/L — ABNORMAL LOW (ref 135–145)

## 2017-05-03 LAB — TROPONIN I
Troponin I: <0.03 ng/mL (ref <0.03)
Troponin I: 0.06 ng/mL (ref <0.03)

## 2017-05-03 LAB — OSMOLALITY, URINE: Osmolality, Ur: 342 mOsm/kg (ref 300–900)

## 2017-05-03 LAB — CORTISOL: Cortisol, Plasma: 33.7 ug/dL

## 2017-05-03 LAB — HIV ANTIBODY (ROUTINE TESTING W REFLEX): HIV Screen 4th Generation wRfx: NONREACTIVE

## 2017-05-03 LAB — CBC
HCT: 33.5 % — ABNORMAL LOW (ref 39.0–52.0)
Hemoglobin: 11.7 g/dL — ABNORMAL LOW (ref 13.0–17.0)
MCH: 33.4 pg (ref 26.0–34.0)
MCHC: 34.9 g/dL (ref 30.0–36.0)
MCV: 95.7 fL (ref 78.0–100.0)
PLATELETS: 212 10*3/uL (ref 150–400)
RBC: 3.5 MIL/uL — ABNORMAL LOW (ref 4.22–5.81)
RDW: 12.8 % (ref 11.5–15.5)
WBC: 8.8 10*3/uL (ref 4.0–10.5)

## 2017-05-03 LAB — MAGNESIUM: Magnesium: 1.4 mg/dL — ABNORMAL LOW (ref 1.7–2.4)

## 2017-05-03 LAB — PSA: Prostatic Specific Antigen: <0.01 ng/mL (ref 0.00–4.00)

## 2017-05-03 LAB — ETHANOL: Alcohol, Ethyl (B): 45 mg/dL — ABNORMAL HIGH (ref <10)

## 2017-05-03 LAB — PROCALCITONIN
Procalcitonin: <0.1 ng/mL
Procalcitonin: 0.15 ng/mL

## 2017-05-03 LAB — TSH: TSH: 1.225 u[IU]/mL (ref 0.350–4.500)

## 2017-05-03 LAB — STREP PNEUMONIAE URINARY ANTIGEN: Strep Pneumo Urinary Antigen: NEGATIVE

## 2017-05-03 LAB — OSMOLALITY: Osmolality: 263 mOsm/kg — ABNORMAL LOW (ref 275–295)

## 2017-05-03 LAB — ECHOCARDIOGRAM COMPLETE

## 2017-05-03 LAB — SODIUM, URINE, RANDOM: Sodium, Ur: 90 mmol/L

## 2017-05-03 MED ORDER — SIMVASTATIN 20 MG PO TABS
20.0000 mg | ORAL_TABLET | Freq: Every day | ORAL | Status: DC
  Administered 2017-05-03 – 2017-05-04 (×2): 20 mg via ORAL
  Filled 2017-05-03 (×3): qty 1

## 2017-05-03 MED ORDER — LORAZEPAM 2 MG/ML IJ SOLN
0.0000 mg | Freq: Four times a day (QID) | INTRAMUSCULAR | Status: DC
  Administered 2017-05-03: 1 mg via INTRAVENOUS
  Filled 2017-05-03: qty 1

## 2017-05-03 MED ORDER — PANTOPRAZOLE SODIUM 40 MG IV SOLR
40.0000 mg | INTRAVENOUS | Status: DC
  Administered 2017-05-04: 40 mg via INTRAVENOUS
  Filled 2017-05-03 (×2): qty 40

## 2017-05-03 MED ORDER — DEXTROSE 5 % IV SOLN
1.0000 g | Freq: Once | INTRAVENOUS | Status: AC
  Administered 2017-05-03: 1 g via INTRAVENOUS
  Filled 2017-05-03: qty 10

## 2017-05-03 MED ORDER — LORAZEPAM 1 MG PO TABS
0.0000 mg | ORAL_TABLET | Freq: Two times a day (BID) | ORAL | Status: DC
Start: 2017-05-05 — End: 2017-05-03

## 2017-05-03 MED ORDER — IOPAMIDOL (ISOVUE-370) INJECTION 76%
INTRAVENOUS | Status: AC
  Filled 2017-05-03: qty 100

## 2017-05-03 MED ORDER — THIAMINE HCL 100 MG/ML IJ SOLN: 100.0000 mg | Freq: Every day | INTRAMUSCULAR | Status: DC

## 2017-05-03 MED ORDER — METOCLOPRAMIDE HCL 5 MG/ML IJ SOLN
10.0000 mg | Freq: Once | INTRAMUSCULAR | Status: AC
  Administered 2017-05-03: 10 mg via INTRAVENOUS
  Filled 2017-05-03: qty 2

## 2017-05-03 MED ORDER — LORAZEPAM 2 MG/ML IJ SOLN: 1.0000 mg | Freq: Four times a day (QID) | INTRAMUSCULAR | Status: DC | PRN

## 2017-05-03 MED ORDER — IOPAMIDOL (ISOVUE-370) INJECTION 76%
100.0000 mL | Freq: Once | INTRAVENOUS | Status: AC | PRN
  Administered 2017-05-03: 100 mL via INTRAVENOUS

## 2017-05-03 MED ORDER — ACETAMINOPHEN 650 MG RE SUPP: 650.0000 mg | Freq: Four times a day (QID) | RECTAL | Status: DC | PRN

## 2017-05-03 MED ORDER — GADOBENATE DIMEGLUMINE 529 MG/ML IV SOLN
15.0000 mL | Freq: Once | INTRAVENOUS | Status: AC | PRN
  Administered 2017-05-03: 15 mL via INTRAVENOUS

## 2017-05-03 MED ORDER — VITAMIN B-1 100 MG PO TABS
100.0000 mg | ORAL_TABLET | Freq: Every day | ORAL | Status: DC
  Administered 2017-05-05: 100 mg via ORAL
  Filled 2017-05-03: qty 1

## 2017-05-03 MED ORDER — LORAZEPAM 1 MG PO TABS
0.0000 mg | ORAL_TABLET | Freq: Four times a day (QID) | ORAL | Status: DC
Start: 2017-05-03 — End: 2017-05-03

## 2017-05-03 MED ORDER — MAGNESIUM SULFATE 2 GM/50ML IV SOLN
2.0000 g | Freq: Once | INTRAVENOUS | Status: AC
  Administered 2017-05-03: 2 g via INTRAVENOUS
  Filled 2017-05-03: qty 50

## 2017-05-03 MED ORDER — AMLODIPINE BESYLATE 10 MG PO TABS
10.0000 mg | ORAL_TABLET | Freq: Every day | ORAL | Status: DC
  Administered 2017-05-03 – 2017-05-05 (×3): 10 mg via ORAL
  Filled 2017-05-03: qty 2
  Filled 2017-05-03 (×2): qty 1

## 2017-05-03 MED ORDER — DEXTROSE 5 % IV SOLN
500.0000 mg | INTRAVENOUS | Status: DC
  Administered 2017-05-04 – 2017-05-05 (×2): 500 mg via INTRAVENOUS
  Filled 2017-05-03 (×2): qty 500

## 2017-05-03 MED ORDER — THIAMINE HCL 100 MG/ML IJ SOLN
100.0000 mg | Freq: Every day | INTRAMUSCULAR | Status: DC
  Administered 2017-05-03 – 2017-05-04 (×2): 100 mg via INTRAVENOUS
  Filled 2017-05-03 (×2): qty 2

## 2017-05-03 MED ORDER — DIPHENHYDRAMINE-ZINC ACETATE 2-0.1 % EX CREA
TOPICAL_CREAM | Freq: Every day | CUTANEOUS | Status: DC | PRN
Start: 2017-05-03 — End: 2017-05-05
  Administered 2017-05-04: via TOPICAL
  Filled 2017-05-03: qty 28

## 2017-05-03 MED ORDER — LISINOPRIL 40 MG PO TABS
40.0000 mg | ORAL_TABLET | Freq: Every day | ORAL | Status: DC
  Administered 2017-05-03 – 2017-05-05 (×3): 40 mg via ORAL
  Filled 2017-05-03: qty 1
  Filled 2017-05-03: qty 2
  Filled 2017-05-03: qty 1

## 2017-05-03 MED ORDER — DEXTROSE 5 % IV SOLN
1.0000 g | INTRAVENOUS | Status: DC
  Administered 2017-05-04 – 2017-05-05 (×2): 1 g via INTRAVENOUS
  Filled 2017-05-03 (×2): qty 10

## 2017-05-03 MED ORDER — BOOST / RESOURCE BREEZE PO LIQD
1.0000 | Freq: Three times a day (TID) | ORAL | Status: DC
  Administered 2017-05-03 – 2017-05-04 (×2): 1 via ORAL

## 2017-05-03 MED ORDER — VITAMIN B-1 100 MG PO TABS: 100.0000 mg | ORAL_TABLET | Freq: Every day | ORAL | Status: DC

## 2017-05-03 MED ORDER — FOLIC ACID 1 MG PO TABS
1.0000 mg | ORAL_TABLET | Freq: Every day | ORAL | Status: DC
  Administered 2017-05-04 – 2017-05-05 (×2): 1 mg via ORAL
  Filled 2017-05-03 (×2): qty 1

## 2017-05-03 MED ORDER — SODIUM CHLORIDE 0.9 % IV BOLUS (SEPSIS)
1000.0000 mL | Freq: Once | INTRAVENOUS | Status: AC
  Administered 2017-05-03: 1000 mL via INTRAVENOUS

## 2017-05-03 MED ORDER — METOPROLOL TARTRATE 100 MG PO TABS
100.0000 mg | ORAL_TABLET | Freq: Two times a day (BID) | ORAL | Status: DC
  Administered 2017-05-03 – 2017-05-05 (×5): 100 mg via ORAL
  Filled 2017-05-03 (×2): qty 1
  Filled 2017-05-03: qty 4
  Filled 2017-05-03 (×2): qty 1

## 2017-05-03 MED ORDER — LORAZEPAM 1 MG PO TABS: 1.0000 mg | ORAL_TABLET | Freq: Four times a day (QID) | ORAL | Status: DC | PRN

## 2017-05-03 MED ORDER — DEXTROSE 5 % IV SOLN
500.0000 mg | Freq: Once | INTRAVENOUS | Status: AC
  Administered 2017-05-03: 500 mg via INTRAVENOUS
  Filled 2017-05-03: qty 500

## 2017-05-03 MED ORDER — ACETAMINOPHEN 325 MG PO TABS
650.0000 mg | ORAL_TABLET | Freq: Four times a day (QID) | ORAL | Status: DC | PRN
  Administered 2017-05-03 – 2017-05-05 (×6): 650 mg via ORAL
  Filled 2017-05-03 (×6): qty 2

## 2017-05-03 MED ORDER — LORAZEPAM 1 MG PO TABS: 0.0000 mg | ORAL_TABLET | Freq: Two times a day (BID) | ORAL | Status: DC

## 2017-05-03 MED ORDER — LORAZEPAM 1 MG PO TABS
0.0000 mg | ORAL_TABLET | Freq: Four times a day (QID) | ORAL | Status: DC
Start: 2017-05-03 — End: 2017-05-03
  Administered 2017-05-03: 1 mg via ORAL
  Filled 2017-05-03: qty 1

## 2017-05-03 MED ORDER — PROMETHAZINE HCL 25 MG/ML IJ SOLN
25.0000 mg | Freq: Four times a day (QID) | INTRAMUSCULAR | Status: DC | PRN
  Administered 2017-05-04: 25 mg via INTRAVENOUS
  Filled 2017-05-03 (×2): qty 1

## 2017-05-03 MED ORDER — PANTOPRAZOLE SODIUM 40 MG PO TBEC
40.0000 mg | DELAYED_RELEASE_TABLET | Freq: Every day | ORAL | Status: DC
Start: 2017-05-03 — End: 2017-05-03

## 2017-05-03 MED ORDER — SODIUM CHLORIDE 0.9 % IV SOLN
INTRAVENOUS | Status: DC
  Administered 2017-05-03 – 2017-05-05 (×4): via INTRAVENOUS

## 2017-05-03 MED ORDER — LORAZEPAM 1 MG PO TABS
1.0000 mg | ORAL_TABLET | Freq: Four times a day (QID) | ORAL | Status: DC | PRN
  Administered 2017-05-03 – 2017-05-05 (×5): 1 mg via ORAL
  Filled 2017-05-03 (×5): qty 1

## 2017-05-03 MED ORDER — TRAZODONE HCL 50 MG PO TABS
50.0000 mg | ORAL_TABLET | Freq: Every evening | ORAL | Status: DC | PRN
  Administered 2017-05-04: 50 mg via ORAL
  Filled 2017-05-03: qty 1

## 2017-05-03 MED ORDER — ADULT MULTIVITAMIN W/MINERALS CH
1.0000 | ORAL_TABLET | Freq: Every day | ORAL | Status: DC
  Administered 2017-05-04 – 2017-05-05 (×2): 1 via ORAL
  Filled 2017-05-03 (×3): qty 1

## 2017-05-03 MED ORDER — LORAZEPAM 2 MG/ML IJ SOLN
1.0000 mg | Freq: Four times a day (QID) | INTRAMUSCULAR | Status: DC | PRN
  Administered 2017-05-03: 1 mg via INTRAVENOUS
  Filled 2017-05-03: qty 1

## 2017-05-03 MED ORDER — LORAZEPAM 2 MG/ML IJ SOLN: 0.0000 mg | Freq: Two times a day (BID) | INTRAMUSCULAR | Status: DC

## 2017-05-03 NOTE — H&P (Signed)
History and Physical    Tanner [name] RCV:[id number] DOB: [birth year] DOA: 05/02/2017  PCP: [name], [name]  Patient coming from: Home.  Chief Complaint: Loss of consciousness.  HPI: Tanner [name] is a 61 y.o. male with history of alcohol abuse, hypertension, hyperlipidemia presents to the ER the patient had an episode of loss of consciousness.  Patient states last evening at home when he was walking towards the bathroom he felt diaphoretic cold and clammy and passed out and his wife held him and did not hit the floor.  Patient states over the last 4 days he has been having some chest pressure and also has been having some nausea vomiting and diarrhea denies using any recent antibiotics or recent sick contacts.  Denies any recent travel.  ED Course: In the ER patient's blood work showed sodium of 120 EKG was showing normal sinus rhythm with nonspecific findings including possible ST depression in the anterolateral leads.  Troponin was negative d-dimer was elevated and the patient underwent CT angiogram of the chest.  Shows spiculated mass in the lung and also multiple nodular miliary lesions.  Blood cultures were obtained in 1 dose of antibiotics was given.  Patient also was given 2 L normal saline bolus for hyponatremia.  Admitted for further observation.  Review of Systems: As per HPI, rest all negative.   Past Medical History:  Diagnosis Date  . Alcohol abuse   . Alcoholic hepatitis   . Allergy   . Anxiety   . 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 C. difficile colitis   . Depression   . DVT (deep venous thrombosis) ([place]) 2015  . GERD (gastroesophageal reflux disease)   . Hypertension   . Ileus ([place]) 09/2013  . 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 Shortness of breath    new onset- occasionally-at rest and exertion  . Substance abuse ([place])   . TIA (transient ischemic attack) 08/2013    Past Surgical History:  Procedure Laterality Date  . HAND SURGERY Right   .  HERNIA REPAIR    . INTRAMEDULLARY (IM) NAIL INTERTROCHANTERIC Right 08/17/2016   Procedure: INTRAMEDULLARY (IM) NAIL RIGHT HIP;  Surgeon: Tania Ade, MD;  Location: [facility];  Service: Orthopedics;  Laterality: Right;  . LYMPHADENECTOMY Bilateral 08/29/2013   Procedure: LYMPHADENECTOMY "BILATERAL PELVIC LYMPH NODE DISSECTION";  Surgeon: Molli Hazard, MD;  Location: [facility];  Service: Urology;  Laterality: Bilateral;  . ORIF ANKLE FRACTURE Right 12/10/2012   Procedure: OPEN REDUCTION INTERNAL FIXATION (ORIF) ANKLE FRACTURE;  Surgeon: Hessie Dibble, MD;  Location: [facility];  Service: Orthopedics;  Laterality: Right;  . ROBOT ASSISTED LAPAROSCOPIC RADICAL PROSTATECTOMY N/A 08/29/2013   Procedure: ROBOTIC ASSISTED LAPAROSCOPIC RADICAL PROSTATECTOMY LEVEL 2, Lysis of adhesions;  Surgeon: Molli Hazard, MD;  Location: [facility];  Service: Urology;  Laterality: N/A;     reports that he has been smoking Cigarettes.  He has a 45.00 pack-year smoking history. He has never used smokeless tobacco. He reports that he drinks about 4.8 - 6.0 oz of alcohol per week . He reports that he uses drugs, including Marijuana.  Allergies  Allergen Reactions  . Dilaudid [Hydromorphone Hcl] Nausea And Vomiting    Pre pt history  . Oxycodone Nausea And Vomiting  . Procaine Hcl Other (See Comments)    Patient states he is allergic to novicaine  . Zofran [Ondansetron] Nausea And Vomiting  . Sulfa Antibiotics Rash    Family  History  Problem Relation Age of Onset  . Lung cancer Father   . Arthritis Mother   . Pancreatic cancer Sister     Prior to Admission medications   Medication Sig Start Date End Date Taking? Authorizing Provider  amLODipine (NORVASC) 10 MG tablet Take 1 tablet (10 mg total) by mouth daily. 08/16/16  Yes [name], [name]  clonazePAM (KLONOPIN) 1 MG tablet TAKE 1-2 TABLETS TWICE DAILY AS NEEDED FOR ANXIETY 04/24/17  Yes [name], [name]  FLUoxetine (PROZAC) 40 MG capsule  TAKE 1 CAPSULE BY MOUTH EVERY DAY 01/12/17  Yes [name], [name]  lisinopril (PRINIVIL,ZESTRIL) 40 MG tablet TAKE ONE (1) TABLET BY MOUTH EVERY DAY 08/16/16  Yes [name], [name]  metoprolol tartrate (LOPRESSOR) 100 MG tablet TAKE 1 TABLET BY MOUTH TWICE A DAY 04/03/17  Yes [name], [name]  omeprazole (PRILOSEC) 40 MG capsule TAKE 1 CAPSULE BY MOUTH TWICE A DAY 04/13/17  Yes [name], [name]  simvastatin (ZOCOR) 20 MG tablet TAKE ONE (1) TABLET BY MOUTH EVERY DAY 03/14/17  Yes [name], [name]  traZODone (DESYREL) 50 MG tablet TAKE 1 TABLET BY MOUTH AT BEDTIME AS NEEDED FOR SLEEP 11/20/16  Yes [name], [name]    Physical Exam: Vitals:   05/03/17 0144 05/03/17 0154 05/03/17 0340 05/03/17 0400  BP: 136/85 136/85 138/89 136/79  Pulse: 79 73 77 80  Resp: (!) 21  (!) 23 16  Temp:      TempSrc:      SpO2: 95%  95% 93%      Constitutional: Moderately built and nourished. Vitals:   05/03/17 0144 05/03/17 0154 05/03/17 0340 05/03/17 0400  BP: 136/85 136/85 138/89 136/79  Pulse: 79 73 77 80  Resp: (!) 21  (!) 23 16  Temp:      TempSrc:      SpO2: 95%  95% 93%   Eyes: Anicteric no pallor. ENMT: No discharge from the ears eyes nose or mouth. Neck: No mass felt.  No neck rigidity.  No JVD appreciated. Respiratory: No rhonchi or crepitations. Cardiovascular: S1-S2 heard no murmurs appreciated. Abdomen: Soft nontender bowel sounds present. Musculoskeletal: No edema.  No joint effusion. Skin: No rash.  Skin appears warm. Neurologic: Alert awake oriented to time place and person.  Moves all extremities. Psychiatric: Appears normal.  Normal affect.   Labs on Admission: I have personally reviewed following labs and imaging studies  CBC:  Recent Labs Lab 05/02/17 2235  WBC 10.5  HGB 12.7*  HCT 35.6*  MCV 95.7  PLT 161   Basic Metabolic Panel:  Recent Labs Lab 05/02/17 2235  NA 120*  K 3.8  CL 87*  CO2 20*  GLUCOSE 74  BUN 9  CREATININE 0.94    CALCIUM 8.4*   GFR: CrCl cannot be calculated (Unknown ideal weight.). Liver Function Tests: No results for input(s): AST, ALT, ALKPHOS, BILITOT, PROT, ALBUMIN in the last 168 hours. No results for input(s): LIPASE, AMYLASE in the last 168 hours. No results for input(s): AMMONIA in the last 168 hours. Coagulation Profile: No results for input(s): INR, PROTIME in the last 168 hours. Cardiac Enzymes: No results for input(s): CKTOTAL, CKMB, CKMBINDEX, TROPONINI in the last 168 hours. BNP (last 3 results) No results for input(s): PROBNP in the last 8760 hours. HbA1C: No results for input(s): HGBA1C in the last 72 hours. CBG: No results for input(s): GLUCAP in the last 168 hours. Lipid Profile: No results for input(s):  CHOL, HDL, LDLCALC, TRIG, CHOLHDL, LDLDIRECT in the last 72 hours. Thyroid Function Tests: No results for input(s): TSH, T4TOTAL, FREET4, T3FREE, THYROIDAB in the last 72 hours. Anemia Panel: No results for input(s): VITAMINB12, FOLATE, FERRITIN, TIBC, IRON, RETICCTPCT in the last 72 hours. Urine analysis:    Component Value Date/Time   COLORURINE STRAW (A) 05/02/2017 2317   APPEARANCEUR CLEAR 05/02/2017 2317   LABSPEC 1.010 05/02/2017 2317   PHURINE 6.0 05/02/2017 2317   GLUCOSEU NEGATIVE 05/02/2017 2317   HGBUR NEGATIVE 05/02/2017 2317   BILIRUBINUR NEGATIVE 05/02/2017 2317   KETONESUR NEGATIVE 05/02/2017 2317   PROTEINUR NEGATIVE 05/02/2017 2317   UROBILINOGEN 0.2 09/26/2013 1457   NITRITE NEGATIVE 05/02/2017 2317   LEUKOCYTESUR NEGATIVE 05/02/2017 2317   Sepsis Labs: @LABRCNTIP (procalcitonin:4,lacticidven:4) )No results found for this or any previous visit (from the past 240 hour(s)).   Radiological Exams on Admission: Dg Chest 2 View  Result Date: 05/02/2017 CLINICAL DATA:  61 year old male with syncope and chest tightness. EXAM: CHEST  2 VIEW COMPARISON:  Chest radiograph dated 08/16/2016 and chest CT dated 07/23/2015 FINDINGS: The lungs are clear.  There is no pleural effusion or pneumothorax. The cardiac silhouette is within normal limits. Appy there is osteopenia with degenerative changes of the spine. Old midthoracic compression deformity similar to the CT of 07/23/2015 none no acute osseous pathology. IMPRESSION: No active cardiopulmonary disease. Electronically Signed   By: Anner Crete M.D.   On: 05/02/2017 23:43   Ct Angio Chest Pe W And/or Wo Contrast  Result Date: 05/03/2017 CLINICAL DATA:  Acute onset of shortness of breath. Initial encounter. EXAM: CT ANGIOGRAPHY CHEST WITH CONTRAST TECHNIQUE: Multidetector CT imaging of the chest was performed using the standard protocol during bolus administration of intravenous contrast. Multiplanar CT image reconstructions and MIPs were obtained to evaluate the vascular anatomy. There was partial extravasation of 40 mL of contrast into the patient's right antecubital fossa; the extravasation site was evaluated by the radiologist. An additional peripheral IV catheter could not be successfully placed. CONTRAST:  60 mL of Isovue 370 IV contrast COMPARISON:  Chest radiograph performed 05/02/2017, and CTA of the chest performed 07/23/2015 FINDINGS: Cardiovascular: There is no evidence of central pulmonary embolus. Evaluation for pulmonary embolus is suboptimal due to partial extravasation of the contrast bolus. The heart is normal in size. The thoracic aorta demonstrates minimal calcification. Mild calcification is seen along the proximal great vessels. Mediastinum/Nodes: There is a spiculated left upper lobe pulmonary nodule abutting the anterior aspect of the mediastinum, measuring approximately 2.2 x 2.1 x 1.4 cm and concerning for malignancy. This is new from 2017. The mediastinum is otherwise unremarkable. No mediastinal lymphadenopathy is seen. No pericardial effusion is identified. The visualized portions of the thyroid gland are unremarkable. No axillary lymphadenopathy is seen. Lungs/Pleura:  Scattered nodular opacities are seen throughout both lungs. There is suggestion of a miliary pattern, though some of the opacities are more diffuse. This may reflect atypical pneumonia. Fungal pneumonia or viral pneumonitis cannot be excluded. Tuberculosis is considered less likely. No pleural effusion or pneumothorax is seen. Upper Abdomen: The visualized portions of the liver and spleen are unremarkable. Musculoskeletal: No acute osseous abnormalities are identified. There is question of lytic lesions within vertebral bodies T1 and T2, apparently new from 2017. There is chronic loss of height at vertebral bodies T8 and T9. The visualized musculature is unremarkable in appearance. Review of the MIP images confirms the above findings. IMPRESSION: 1. No evidence of central pulmonary embolus. 2. Scattered nodular opacities  throughout both lungs. Suggestion of a miliary pattern, though some of the opacities are more diffuse. This is concerning for atypical pneumonia. Fungal pneumonia or viral pneumonitis cannot be excluded. Tuberculosis is considered less likely. Would correlate with the patient's symptoms. 3. Spiculated left upper lobe pulmonary nodule abutting the anterior aspect of the mediastinum, new from 2017. This measures 2.2 x 2.1 x 1.4 cm, and is concerning for primary bronchogenic malignancy. Tissue diagnosis and/or PET/CT would be helpful for further evaluation. 4. Question of lytic lesions within vertebral bodies T1 and T2, apparently new from 2017. This could be further assessed on bone scan or the PET/CT, as deemed clinically appropriate. Electronically Signed   By: Garald Balding M.D.   On: 05/03/2017 02:08    EKG: Independently reviewed.  Normal sinus rhythm with ST depressions in anterolateral leads.  Assessment/Plan Principal Problem:   Near syncope Active Problems:   Hyponatremia   Malignant neoplasm of prostate (HCC)   Spastic hemiplegia affecting dominant side (HCC)   Essential  hypertension   Alcohol dependence ([place])    1. Syncope -cause not clear.  Closely monitor telemetry check 2D echo check orthostatics. 2. Hyponatremia -likely a combination of alcohol abuse and dehydration from nausea vomiting and diarrhea.  Patient did receive 2 L normal saline bolus in the ER.  At this time we will check urine sodium urine osmolality TSH cortisol and based on the studies and metabolic panel.  We will have further plans.  We will hold off any further fluids. 3. Abnormal CT chest with miliary lesions and spiculated lung lesion concerning for bronchogenic carcinoma -miliary lesions are concerning for infection.  Blood cultures have been obtained.  Patient did receive antibiotic in the ER.  We will continue with that for now and check pro calcitonin levels.  Consult pulmonary in the morning for spiculated lung lesion.  Patient has no definite signs or symptoms suggestive of tuberculosis. 4. Hypertension on -metoprolol lisinopril and amlodipine. 5. Chest discomfort -presently chest pain-free.  But patient states over the last 4 days has been having off and chest pressure.  We will cycle cardiac markers and check 2D echo. 6. Alcohol abuse -placed on CIWA protocol. 7. Hyperlipidemia on statins.   DVT prophylaxis: SCDs. Code Status: Full code. Family Communication: Discussed with patient. Disposition Plan: Home. Consults called: None. Admission status: Observation.   Rise Patience MD [facility] Pager [phone].  If 7PM-7AM, please contact night-coverage [email or web address] Password [email or web address]  05/03/2017, 5:00 AM

## 2017-05-03 NOTE — Consult Note (Signed)
Name: Tanner [name] MRN: [id number] DOB: May 12, 1956    ADMISSION DATE:  05/02/2017 CONSULTATION DATE:  10/25  REFERRING MD :  Rai ([facility])   CHIEF COMPLAINT:  Lung mass   BRIEF PATIENT DESCRIPTION: 61yo male smoker (quit August 2018) with hx remote prostate cancer,  ETOH abuse, HTN presented 10/25 with near syncope, chest pressure, n/v/d.  ER w/u revealed Na 120, neg pct, neg troponin, hemodynamically stable.  CTA chest was neg for PE but revealed scattered nodular opacities as well as spiculated LUL mass and questionable T1 and T2 vertebral body lesions concerned for metastasis.   SIGNIFICANT EVENTS    STUDIES:  CTA chest 10/25>>> 1. No evidence of central pulmonary embolus. 2. Scattered nodular opacities throughout both lungs. Suggestion of a miliary pattern, though some of the opacities are more diffuse. This is concerning for atypical pneumonia. Fungal pneumonia or viral pneumonitis cannot be excluded. Tuberculosis is considered less likely. Would correlate with the patient's symptoms. 3. Spiculated left upper lobe pulmonary nodule abutting the anterior aspect of the mediastinum, new from 2017. This measures 2.2 x 2.1 x 1.4 cm, and is concerning for primary bronchogenic malignancy. Tissue diagnosis and/or PET/CT would be helpful for further evaluation. 4. Question of lytic lesions within vertebral bodies T1 and T2, apparently new from 2017. This could be further assessed on bone scan or the PET/CT, as deemed clinically appropriate.   HISTORY OF PRESENT ILLNESS: 60yo male smoker (quit August 2018) with hx remote prostate cancer,  ETOH abuse, HTN presented 10/25 with near syncope, chest pressure, n/v/d.  ER w/u revealed Na 120, neg pct, neg troponin, hemodynamically stable.  CTA chest was neg for PE but revealed scattered nodular opacities as well as spiculated LUL mass and questionable T1 and T2 vertebral body lesions concerned for metastasis.   Currently feeling a bit better.   Does c/o chest pressure and back pain over last week or so.  C/o mild SOB, productive cough, ongoing nausea without significant emesis.  Denies hemoptysis, night sweats, fevers.   Quit smoking august 2018, prior to this smoked about 15 years, 1 ppd.    Denies Weight loss   Denies recent travel, sick contacts, has never spent any time in [place], never incarcerated, no known TB exposures.    PAST MEDICAL HISTORY :   has a past medical history of Alcohol abuse; Alcoholic hepatitis; Allergy; Anxiety; Arthritis; C. difficile colitis; Depression; DVT (deep venous thrombosis) ([place]) (2015); GERD (gastroesophageal reflux disease); Hypertension; Ileus ([place]) (09/2013); Prostate cancer ([place]) (05/22/13); Shortness of breath; Substance abuse ([place]); and TIA (transient ischemic attack) (08/2013).  has a past surgical history that includes Hand surgery (Right); Hernia repair; ORIF ankle fracture (Right, 12/10/2012); Robot assisted laparoscopic radical prostatectomy (N/A, 08/29/2013); Lymphadenectomy (Bilateral, 08/29/2013); and Intramedullary (im) nail intertrochanteric (Right, 08/17/2016). Prior to Admission medications   Medication Sig Start Date End Date Taking? Authorizing Provider  amLODipine (NORVASC) 10 MG tablet Take 1 tablet (10 mg total) by mouth daily. 08/16/16  Yes [name], [name]  clonazePAM (KLONOPIN) 1 MG tablet TAKE 1-2 TABLETS TWICE DAILY AS NEEDED FOR ANXIETY 04/24/17  Yes [name], [name]  FLUoxetine (PROZAC) 40 MG capsule TAKE 1 CAPSULE BY MOUTH EVERY DAY 01/12/17  Yes [name], [name]  lisinopril (PRINIVIL,ZESTRIL) 40 MG tablet TAKE ONE (1) TABLET BY MOUTH EVERY DAY 08/16/16  Yes [name], [name]  metoprolol tartrate (LOPRESSOR) 100 MG tablet TAKE 1 TABLET BY MOUTH TWICE A DAY 04/03/17  Yes [name], [name]  omeprazole (PRILOSEC) 40 MG capsule TAKE 1 CAPSULE BY MOUTH TWICE A DAY 04/13/17  Yes [name], [name]  simvastatin (ZOCOR) 20 MG tablet TAKE ONE (1) TABLET BY MOUTH  EVERY DAY 03/14/17  Yes [name], [name]  traZODone (DESYREL) 50 MG tablet TAKE 1 TABLET BY MOUTH AT BEDTIME AS NEEDED FOR SLEEP 11/20/16  Yes [name], [name]   Allergies  Allergen Reactions  . Dilaudid [Hydromorphone Hcl] Nausea And Vomiting    Pre pt history  . Oxycodone Nausea And Vomiting  . Procaine Hcl Other (See Comments)    Patient states he is allergic to novicaine  . Zofran [Ondansetron] Nausea And Vomiting  . Sulfa Antibiotics Rash    FAMILY HISTORY:  family history includes Arthritis in his mother; Lung cancer in his father; Pancreatic cancer in his sister. SOCIAL HISTORY:  reports that he has been smoking Cigarettes.  He has a 45.00 pack-year smoking history. He has never used smokeless tobacco. He reports that he drinks about 4.8 - 6.0 oz of alcohol per week . He reports that he uses drugs, including Marijuana.  REVIEW OF SYSTEMS:   As per HPI - All other systems reviewed and were neg.     SUBJECTIVE:   VITAL SIGNS: Temp:  [97.6 F (36.4 C)] 97.6 F (36.4 C) (10/24 2316) Pulse Rate:  [68-94] 76 (10/25 1100) Resp:  [15-23] 23 (10/25 1100) BP: (122-140)/(77-89) 137/89 (10/25 1100) SpO2:  [91 %-98 %] 95 % (10/25 1100)  PHYSICAL EXAMINATION: General:  Pleasant male, NAD, appears older than stated age  Neuro:  Awake, alert, appropriate HEENT:  Mm moist, no JVD, no palpable LA  Cardiovascular:  ss1s2 rrr Lungs:  resps even non labored on RA, few scattered rhonchi otherwise essentially clear  Abdomen:  Round, soft, non tender  Musculoskeletal:  Warm and dry, no edema    Recent Labs Lab 05/02/17 2235 05/03/17 0520 05/03/17 0818  NA 120* 123* 124*  K 3.8 3.8 3.7  CL 87* 90* 90*  CO2 20* 22 23  BUN 9 7 7   CREATININE 0.94 0.75 0.72  GLUCOSE 74 191* 105*    Recent Labs Lab 05/02/17 2235 05/03/17 0520  HGB 12.7* 11.7*  HCT 35.6* 33.5*  WBC 10.5 8.8  PLT 261 212   Dg Chest 2 View  Result Date: 05/02/2017 CLINICAL DATA:  61 year old male  with syncope and chest tightness. EXAM: CHEST  2 VIEW COMPARISON:  Chest radiograph dated 08/16/2016 and chest CT dated 07/23/2015 FINDINGS: The lungs are clear. There is no pleural effusion or pneumothorax. The cardiac silhouette is within normal limits. Appy there is osteopenia with degenerative changes of the spine. Old midthoracic compression deformity similar to the CT of 07/23/2015 none no acute osseous pathology. IMPRESSION: No active cardiopulmonary disease. Electronically Signed   By: Anner Crete M.D.   On: 05/02/2017 23:43   Ct Angio Chest Pe W And/or Wo Contrast  Result Date: 05/03/2017 CLINICAL DATA:  Acute onset of shortness of breath. Initial encounter. EXAM: CT ANGIOGRAPHY CHEST WITH CONTRAST TECHNIQUE: Multidetector CT imaging of the chest was performed using the standard protocol during bolus administration of intravenous contrast. Multiplanar CT image reconstructions and MIPs were obtained to evaluate the vascular anatomy. There was partial extravasation of 40 mL of contrast into the patient's right antecubital fossa; the extravasation site was evaluated by the radiologist. An additional peripheral IV catheter could not be successfully placed. CONTRAST:  60 mL of Isovue 370 IV contrast COMPARISON:  Chest radiograph  performed 05/02/2017, and CTA of the chest performed 07/23/2015 FINDINGS: Cardiovascular: There is no evidence of central pulmonary embolus. Evaluation for pulmonary embolus is suboptimal due to partial extravasation of the contrast bolus. The heart is normal in size. The thoracic aorta demonstrates minimal calcification. Mild calcification is seen along the proximal great vessels. Mediastinum/Nodes: There is a spiculated left upper lobe pulmonary nodule abutting the anterior aspect of the mediastinum, measuring approximately 2.2 x 2.1 x 1.4 cm and concerning for malignancy. This is new from 2017. The mediastinum is otherwise unremarkable. No mediastinal lymphadenopathy is  seen. No pericardial effusion is identified. The visualized portions of the thyroid gland are unremarkable. No axillary lymphadenopathy is seen. Lungs/Pleura: Scattered nodular opacities are seen throughout both lungs. There is suggestion of a miliary pattern, though some of the opacities are more diffuse. This may reflect atypical pneumonia. Fungal pneumonia or viral pneumonitis cannot be excluded. Tuberculosis is considered less likely. No pleural effusion or pneumothorax is seen. Upper Abdomen: The visualized portions of the liver and spleen are unremarkable. Musculoskeletal: No acute osseous abnormalities are identified. There is question of lytic lesions within vertebral bodies T1 and T2, apparently new from 2017. There is chronic loss of height at vertebral bodies T8 and T9. The visualized musculature is unremarkable in appearance. Review of the MIP images confirms the above findings. IMPRESSION: 1. No evidence of central pulmonary embolus. 2. Scattered nodular opacities throughout both lungs. Suggestion of a miliary pattern, though some of the opacities are more diffuse. This is concerning for atypical pneumonia. Fungal pneumonia or viral pneumonitis cannot be excluded. Tuberculosis is considered less likely. Would correlate with the patient's symptoms. 3. Spiculated left upper lobe pulmonary nodule abutting the anterior aspect of the mediastinum, new from 2017. This measures 2.2 x 2.1 x 1.4 cm, and is concerning for primary bronchogenic malignancy. Tissue diagnosis and/or PET/CT would be helpful for further evaluation. 4. Question of lytic lesions within vertebral bodies T1 and T2, apparently new from 2017. This could be further assessed on bone scan or the PET/CT, as deemed clinically appropriate. Electronically Signed   By: Garald Balding M.D.   On: 05/03/2017 02:08    ASSESSMENT / PLAN:  Abnormal chest CT -- Spiculated LUL mass (new since 2017) and possible vertebral lesions T1 and T2.  Scattered  nodular opacities ?atypical PNA v viral pneumonitis v aspiration. No leukcytosis, afebrile.  No clear infectious hx.  Near syncope - likely r/t dehydration, hyponatremia.  Troponin neg. CTA neg for PE   PLAN -  Sputum culture  Continue empiric IV abx Echo pending  Consider bone scan v outpt PET  Will discuss with [name]   Other issues per primary   Nickolas Madrid, NP 05/03/2017  11:43 AM Pager: [phone] or [phone]

## 2017-05-03 NOTE — ED Notes (Signed)
Pt IV site infiltrated while at CT scan, IV was removed by CT and ice placed on site. Area is swollen and tender to touch.

## 2017-05-03 NOTE — Progress Notes (Signed)
Pharmacy Antibiotic Note  Tanner [name] is a 61 y.o. male admitted on 05/02/2017 with pneumonia.  Pharmacy has been consulted for ceftriaxone dosing. WBC wnl, PCT < 0.1. H/H low, Plt wnl   Plan: -Ceftriaxone 1 gm IV Q 24 hours -Monitor CBC, cultures and clinical progress -Pharmacy to sign off since no further dose adjustment necessary      Temp (24hrs), Avg:97.6 F (36.4 C), Min:97.6 F (36.4 C), Max:97.6 F (36.4 C)   Recent Labs Lab 05/02/17 2235 05/03/17 0520 05/03/17 0818  WBC 10.5 8.8  --   CREATININE 0.94 0.75 0.72    CrCl cannot be calculated (Unknown ideal weight.).    Allergies  Allergen Reactions  . Dilaudid [Hydromorphone Hcl] Nausea And Vomiting    Pre pt history  . Oxycodone Nausea And Vomiting  . Procaine Hcl Other (See Comments)    Patient states he is allergic to novicaine  . Zofran [Ondansetron] Nausea And Vomiting  . Sulfa Antibiotics Rash    Antimicrobials this admission: Ceftriaxone 10/25 >>  Azithromycin 10/25 >>   Dose adjustments this admission: None  Microbiology results: 10/25 BCx:    Thank you for allowing pharmacy to be a part of this patient's care.  Albertina Parr, PharmD., BCPS Clinical Pharmacist Pager [phone]

## 2017-05-03 NOTE — Progress Notes (Addendum)
[facility]                                                                              Patient Demographics  Tanner [name], is a 61 y.o. male, DOB - [birth year], KVQ:[id number]  Admit date - 05/02/2017   Admitting Physician No admitting provider for patient encounter.  Outpatient Primary MD for the patient is [name], [name]  Outpatient specialists:   LOS - 0  days   Medical records reviewed and are as summarized below:    Chief Complaint  Patient presents with  . Near Syncope       Brief summary   Per admit note by [name] on 10/25 Tanner [name] is a 60 y.o. male with history of alcohol abuse, hypertension, hyperlipidemia presents to the ER the patient had an episode of loss of consciousness.  Patient states last evening at home when he was walking towards the bathroom he felt diaphoretic cold and clammy and passed out and his wife held him and did not hit the floor.  Patient states over the last 4 days he has been having some chest pressure and also has been having some nausea vomiting and diarrhea denies using any recent antibiotics or recent sick contacts.  Denies any recent travel. CT angiogram showed spiculated mass in the lung, multiple nodular lesions.  Sodium 120 at the time of admission  Assessment & Plan    Principal Problem:   Near syncope -Possibly due to dehydration, hyponatremia, nausea, vomiting, diarrhea.  Sodium 120 at the time of admission -Troponins x2 negative -2D echo pending for further workup  Active Problems:   Lung mass, dyspnea with chest pressure, atypical pneumonia -Has a history of smoking, quit in August  2018 -Troponins x2-, CT angiogram of the chest showed no PE, scattered nodular opacities throughout both lungs, concerning for possible atypical pneumonia.  Spiculated left upper lobe pulmonary nodule new from 2017, measures 2.2 x 2.1 x 1.4cm.  Question of lytic lesions in the vertebral bodies T1 and T2,  recommended bone scan or PET/CT -Continue IV Zithromax, Rocephin, there is a possibility of aspiration given his intractable nausea and vomiting -Discussed with pulmonology, [name], also recommended MRI of the brain to rule out any brain metastasis and MRI of the T-spine to assess the lytic lesions further.    Hyponatremia, hypoosmolar, nausea, vomiting -Ordered serum osmolarity, calculated osmolality 253.  Also having nausea, vomiting, diarrhea for the last few days.  Urine osmolarity 342, UNA 90 -Continue gentle hydration, repeat serial BMET - Placed on clear liquid diet, advance as tolerated. - Placed on Zofran-as needed    Malignant neoplasm of prostate ([place]) -Followed by [name] of urology, outpatient.  Per patient in remission.  PSA 0.0 in February 2018  - will recheck PSA     Essential hypertension -Currently stable, continue amlodipine, lisinopril, metoprolol    Alcohol dependence ([place]) -States he drinks 6-8 beers every day, placed on CIWA with ativan     Tobacco dependence -States quit in August 2018   Code Status: full code  DVT Prophylaxis:  SCD's Family Communication: Discussed in detail with the patient,  all imaging results, lab results explained to the patient   Disposition Plan:   Time Spent in minutes   35 minutes  Procedures:  CTA chest   Consultants:   Pulmonology   Antimicrobials:   IV Zithromax 10/25>  IV Rocephin 10/25   Medications  Scheduled Meds: . amLODipine  10 mg Oral Daily  . folic acid  1 mg Oral Daily  . lisinopril  40 mg Oral Daily  . metoprolol tartrate  100 mg Oral BID  . multivitamin with minerals  1 tablet Oral Daily  . pantoprazole (PROTONIX) IV  40 mg Intravenous Q24H  . simvastatin  20 mg Oral q1800  . thiamine  100 mg Oral Daily   Or  . thiamine  100 mg Intravenous Daily   Continuous Infusions: . sodium chloride 100 mL/hr at 05/03/17 0653  . [START ON 05/04/2017] azithromycin    . [START ON 05/04/2017]  cefTRIAXone (ROCEPHIN)  IV     PRN Meds:.acetaminophen **OR** acetaminophen, LORazepam **OR** LORazepam, promethazine, traZODone   Antibiotics   Anti-infectives    Start     Dose/Rate Route Frequency Ordered Stop   05/04/17 0500  azithromycin (ZITHROMAX) 500 mg in dextrose 5 % 250 mL IVPB     500 mg 250 mL/hr over 60 Minutes Intravenous Every 24 hours 05/03/17 0846     05/04/17 0500  cefTRIAXone (ROCEPHIN) 1 g in dextrose 5 % 50 mL IVPB     1 g 100 mL/hr over 30 Minutes Intravenous Every 24 hours 05/03/17 0942     05/03/17 0315  cefTRIAXone (ROCEPHIN) 1 g in dextrose 5 % 50 mL IVPB     1 g 100 mL/hr over 30 Minutes Intravenous  Once 05/03/17 0308 05/03/17 0418   05/03/17 0315  azithromycin (ZITHROMAX) 500 mg in dextrose 5 % 250 mL IVPB     500 mg 250 mL/hr over 60 Minutes Intravenous  Once 05/03/17 0308 05/03/17 0524        Subjective:   Tanner [name] was seen and examined today.  Feeling somewhat better today, no chest pain.  No fevers or chills.  Still having nausea.  Patient denies dizziness, chest pain, shortness of breath, new weakness, numbess, tingling.     Objective:   Vitals:   05/03/17 0608 05/03/17 0652 05/03/17 0700 05/03/17 0800  BP: 134/82 134/82 140/83 140/82  Pulse: 81 81  82  Resp: 17   17  Temp:      TempSrc:      SpO2: 91%   95%    Intake/Output Summary (Last 24 hours) at 05/03/17 1044 Last data filed at 05/03/17 4696  Gross per 24 hour  Intake             2000 ml  Output             2000 ml  Net                0 ml     Wt Readings from Last 3 Encounters:  09/20/16 76.7 kg (169 lb)  08/23/16 75.3 kg (166 lb)  08/16/16 80.7 kg (178 lb)     Exam  General: Alert and oriented x 3, NAD  Eyes:   HEENT:  Atraumatic, normocephalic  Cardiovascular: S1 S2 auscultated, no rubs, murmurs or gallops. Regular rate and rhythm.  Respiratory: Decreased sounds at the bases  Gastrointestinal: Soft, nontender, nondistended, + bowel sounds  Ext: no  pedal edema bilaterally  Neuro: AAOx3, Cr N's II- XII. Strength 5/5  upper and lower extremities bilaterally, speech clear  Musculoskeletal: No digital cyanosis, clubbing  Skin: No rashes  Psych: Normal affect and demeanor, alert and oriented x3    Data Reviewed:  I have personally reviewed following labs and imaging studies  Micro Results No results found for this or any previous visit (from the past 240 hour(s)).  Radiology Reports Dg Chest 2 View  Result Date: 05/02/2017 CLINICAL DATA:  61 year old male with syncope and chest tightness. EXAM: CHEST  2 VIEW COMPARISON:  Chest radiograph dated 08/16/2016 and chest CT dated 07/23/2015 FINDINGS: The lungs are clear. There is no pleural effusion or pneumothorax. The cardiac silhouette is within normal limits. Appy there is osteopenia with degenerative changes of the spine. Old midthoracic compression deformity similar to the CT of 07/23/2015 none no acute osseous pathology. IMPRESSION: No active cardiopulmonary disease. Electronically Signed   By: Anner Crete M.D.   On: 05/02/2017 23:43   Ct Angio Chest Pe W And/or Wo Contrast  Result Date: 05/03/2017 CLINICAL DATA:  Acute onset of shortness of breath. Initial encounter. EXAM: CT ANGIOGRAPHY CHEST WITH CONTRAST TECHNIQUE: Multidetector CT imaging of the chest was performed using the standard protocol during bolus administration of intravenous contrast. Multiplanar CT image reconstructions and MIPs were obtained to evaluate the vascular anatomy. There was partial extravasation of 40 mL of contrast into the patient's right antecubital fossa; the extravasation site was evaluated by the radiologist. An additional peripheral IV catheter could not be successfully placed. CONTRAST:  60 mL of Isovue 370 IV contrast COMPARISON:  Chest radiograph performed 05/02/2017, and CTA of the chest performed 07/23/2015 FINDINGS: Cardiovascular: There is no evidence of central pulmonary embolus. Evaluation for  pulmonary embolus is suboptimal due to partial extravasation of the contrast bolus. The heart is normal in size. The thoracic aorta demonstrates minimal calcification. Mild calcification is seen along the proximal great vessels. Mediastinum/Nodes: There is a spiculated left upper lobe pulmonary nodule abutting the anterior aspect of the mediastinum, measuring approximately 2.2 x 2.1 x 1.4 cm and concerning for malignancy. This is new from 2017. The mediastinum is otherwise unremarkable. No mediastinal lymphadenopathy is seen. No pericardial effusion is identified. The visualized portions of the thyroid gland are unremarkable. No axillary lymphadenopathy is seen. Lungs/Pleura: Scattered nodular opacities are seen throughout both lungs. There is suggestion of a miliary pattern, though some of the opacities are more diffuse. This may reflect atypical pneumonia. Fungal pneumonia or viral pneumonitis cannot be excluded. Tuberculosis is considered less likely. No pleural effusion or pneumothorax is seen. Upper Abdomen: The visualized portions of the liver and spleen are unremarkable. Musculoskeletal: No acute osseous abnormalities are identified. There is question of lytic lesions within vertebral bodies T1 and T2, apparently new from 2017. There is chronic loss of height at vertebral bodies T8 and T9. The visualized musculature is unremarkable in appearance. Review of the MIP images confirms the above findings. IMPRESSION: 1. No evidence of central pulmonary embolus. 2. Scattered nodular opacities throughout both lungs. Suggestion of a miliary pattern, though some of the opacities are more diffuse. This is concerning for atypical pneumonia. Fungal pneumonia or viral pneumonitis cannot be excluded. Tuberculosis is considered less likely. Would correlate with the patient's symptoms. 3. Spiculated left upper lobe pulmonary nodule abutting the anterior aspect of the mediastinum, new from 2017. This measures 2.2 x 2.1 x 1.4  cm, and is concerning for primary bronchogenic malignancy. Tissue diagnosis and/or PET/CT would be helpful for further evaluation. 4. Question of lytic lesions within  vertebral bodies T1 and T2, apparently new from 2017. This could be further assessed on bone scan or the PET/CT, as deemed clinically appropriate. Electronically Signed   By: Garald Balding M.D.   On: 05/03/2017 02:08    Lab Data:  CBC:  Recent Labs Lab 05/02/17 2235 05/03/17 0520  WBC 10.5 8.8  HGB 12.7* 11.7*  HCT 35.6* 33.5*  MCV 95.7 95.7  PLT 261 063   Basic Metabolic Panel:  Recent Labs Lab 05/02/17 2235 05/03/17 0520 05/03/17 0818  NA 120* 123* 124*  K 3.8 3.8 3.7  CL 87* 90* 90*  CO2 20* 22 23  GLUCOSE 74 191* 105*  BUN 9 7 7   CREATININE 0.94 0.75 0.72  CALCIUM 8.4* 7.7* 8.0*  MG  --  1.4*  --    GFR: CrCl cannot be calculated (Unknown ideal weight.). Liver Function Tests: No results for input(s): AST, ALT, ALKPHOS, BILITOT, PROT, ALBUMIN in the last 168 hours. No results for input(s): LIPASE, AMYLASE in the last 168 hours. No results for input(s): AMMONIA in the last 168 hours. Coagulation Profile: No results for input(s): INR, PROTIME in the last 168 hours. Cardiac Enzymes:  Recent Labs Lab 05/03/17 0520  TROPONINI <0.03   BNP (last 3 results) No results for input(s): PROBNP in the last 8760 hours. HbA1C: No results for input(s): HGBA1C in the last 72 hours. CBG: No results for input(s): GLUCAP in the last 168 hours. Lipid Profile: No results for input(s): CHOL, HDL, LDLCALC, TRIG, CHOLHDL, LDLDIRECT in the last 72 hours. Thyroid Function Tests:  Recent Labs  05/03/17 0521  TSH 1.225   Anemia Panel: No results for input(s): VITAMINB12, FOLATE, FERRITIN, TIBC, IRON, RETICCTPCT in the last 72 hours. Urine analysis:    Component Value Date/Time   COLORURINE STRAW (A) 05/02/2017 2317   APPEARANCEUR CLEAR 05/02/2017 2317   LABSPEC 1.010 05/02/2017 2317   PHURINE 6.0 05/02/2017  2317   GLUCOSEU NEGATIVE 05/02/2017 2317   HGBUR NEGATIVE 05/02/2017 2317   BILIRUBINUR NEGATIVE 05/02/2017 2317   KETONESUR NEGATIVE 05/02/2017 2317   PROTEINUR NEGATIVE 05/02/2017 2317   UROBILINOGEN 0.2 09/26/2013 1457   NITRITE NEGATIVE 05/02/2017 2317   LEUKOCYTESUR NEGATIVE 05/02/2017 2317     Ripudeep Rai M.D. [facility] 05/03/2017, 10:44 AM  Pager: [phone] Between 7am to 7pm - call Pager - [phone]  After 7pm go to [email or web address] - password [email or web address]  Call night coverage person covering after 7pm

## 2017-05-03 NOTE — ED Notes (Signed)
Critical result called up from lab. 0.06 troponin resulted

## 2017-05-03 NOTE — ED Notes (Signed)
Clear liquid tray ordered for pt at this time

## 2017-05-03 NOTE — Progress Notes (Signed)
Pt refusing PCR at this time. Will attempt again.

## 2017-05-03 NOTE — ED Notes (Signed)
Admitting at bedside 

## 2017-05-03 NOTE — ED Notes (Signed)
Pt aware of PO meds scheduled for 1000, is willing to try taking them. Nausea is reduced.

## 2017-05-03 NOTE — ED Notes (Signed)
Echo tech in with pt at this time 

## 2017-05-03 NOTE — Progress Notes (Signed)
Pt had 60 mls Isovue 370 IV in right antecubital. Dr Radene Knee assessed pt, IV was removed, arm elevated, heat placed at site, verbal instruction given to pt, and verbal handoff given to Mammie Russian, Therapist, sports.

## 2017-05-03 NOTE — ED Notes (Signed)
Pt vomited small amount of recently given PO ativan. MD paged regarding IV ativan for pt and PRN nausea medication.

## 2017-05-03 NOTE — ED Notes (Addendum)
Pt states he has rods from orthopedic surgeries in his right foot, ankle, shin, and hip. Pt is unsure what the rods and pins are made of. Pt also mentions claustrophobia.  Pt complains of posterior headache that radiates to the back of his neck. 650 mg Tylenol given.

## 2017-05-03 NOTE — Progress Notes (Signed)
  Echocardiogram 2D Echocardiogram has been performed.  Tanner [name] 05/03/2017, 2:32 PM

## 2017-05-04 ENCOUNTER — Inpatient Hospital Stay (HOSPITAL_COMMUNITY): Payer: Medicare HMO

## 2017-05-04 DIAGNOSIS — R918 Other nonspecific abnormal finding of lung field: Secondary | ICD-10-CM

## 2017-05-04 DIAGNOSIS — C61 Malignant neoplasm of prostate: Secondary | ICD-10-CM

## 2017-05-04 LAB — CBC
HEMATOCRIT: 34.9 % — AB (ref 39.0–52.0)
Hemoglobin: 12 g/dL — ABNORMAL LOW (ref 13.0–17.0)
MCH: 33.2 pg (ref 26.0–34.0)
MCHC: 34.4 g/dL (ref 30.0–36.0)
MCV: 96.7 fL (ref 78.0–100.0)
Platelets: 252 10*3/uL (ref 150–400)
RBC: 3.61 MIL/uL — ABNORMAL LOW (ref 4.22–5.81)
RDW: 13.1 % (ref 11.5–15.5)
WBC: 7.2 10*3/uL (ref 4.0–10.5)

## 2017-05-04 LAB — BASIC METABOLIC PANEL
Anion gap: 10 (ref 5–15)
BUN: <5 mg/dL — ABNORMAL LOW (ref 6–20)
CALCIUM: 9.2 mg/dL (ref 8.9–10.3)
CO2: 28 mmol/L (ref 22–32)
CREATININE: 0.68 mg/dL (ref 0.61–1.24)
Chloride: 91 mmol/L — ABNORMAL LOW (ref 101–111)
GFR calc Af Amer: >60 mL/min (ref >60)
GFR calc non Af Amer: >60 mL/min (ref >60)
GLUCOSE: 100 mg/dL — AB (ref 65–99)
Potassium: 3.4 mmol/L — ABNORMAL LOW (ref 3.5–5.1)
Sodium: 129 mmol/L — ABNORMAL LOW (ref 135–145)

## 2017-05-04 LAB — RESPIRATORY PANEL BY PCR
Adenovirus: NOT DETECTED
Bordetella pertussis: NOT DETECTED
CORONAVIRUS 229E-RVPPCR: NOT DETECTED
CORONAVIRUS HKU1-RVPPCR: NOT DETECTED
CORONAVIRUS NL63-RVPPCR: NOT DETECTED
CORONAVIRUS OC43-RVPPCR: NOT DETECTED
Chlamydophila pneumoniae: NOT DETECTED
Influenza A: NOT DETECTED
Influenza B: NOT DETECTED
METAPNEUMOVIRUS-RVPPCR: NOT DETECTED
Mycoplasma pneumoniae: NOT DETECTED
PARAINFLUENZA VIRUS 1-RVPPCR: NOT DETECTED
PARAINFLUENZA VIRUS 2-RVPPCR: NOT DETECTED
Parainfluenza Virus 3: NOT DETECTED
Parainfluenza Virus 4: NOT DETECTED
RESPIRATORY SYNCYTIAL VIRUS-RVPPCR: NOT DETECTED
Rhinovirus / Enterovirus: NOT DETECTED

## 2017-05-04 LAB — EXPECTORATED SPUTUM ASSESSMENT W GRAM STAIN, RFLX TO RESP C

## 2017-05-04 LAB — EXPECTORATED SPUTUM ASSESSMENT W REFEX TO RESP CULTURE

## 2017-05-04 LAB — LEGIONELLA PNEUMOPHILA SEROGP 1 UR AG: L. PNEUMOPHILA SEROGP 1 UR AG: NEGATIVE

## 2017-05-04 LAB — PROCALCITONIN: Procalcitonin: 0.12 ng/mL

## 2017-05-04 MED ORDER — POTASSIUM CHLORIDE CRYS ER 20 MEQ PO TBCR
40.0000 meq | EXTENDED_RELEASE_TABLET | Freq: Once | ORAL | Status: AC
  Administered 2017-05-04: 40 meq via ORAL
  Filled 2017-05-04: qty 2

## 2017-05-04 MED ORDER — IOPAMIDOL (ISOVUE-300) INJECTION 61%
INTRAVENOUS | Status: AC
  Filled 2017-05-04: qty 30

## 2017-05-04 MED ORDER — ENSURE ENLIVE PO LIQD
237.0000 mL | Freq: Two times a day (BID) | ORAL | Status: DC
  Administered 2017-05-05: 237 mL via ORAL

## 2017-05-04 MED ORDER — IOPAMIDOL (ISOVUE-300) INJECTION 61%
INTRAVENOUS | Status: AC
  Filled 2017-05-04: qty 100

## 2017-05-04 NOTE — Progress Notes (Signed)
Initial Nutrition Assessment  DOCUMENTATION CODES:   Not applicable  INTERVENTION:    Strawberry Ensure Enlive po BID, each supplement provides 350 kcal and 20 grams of protein  NUTRITION DIAGNOSIS:   Increased nutrient needs related to chronic illness as evidenced by estimated needs  GOAL:   Patient will meet greater than or equal to 90% of their needs   MONITOR:   PO intake, Supplement acceptance, Labs, Skin  REASON FOR ASSESSMENT:   Malnutrition Screening Tool  ASSESSMENT:   [name] with history of alcohol abuse, hypertension, hyperlipidemia presents to the ER the patient had an episode of loss of consciousness.  Patient states last evening at home when he was walking towards the bathroom he felt diaphoretic cold and clammy and passed out and his wife held him and did not hit the floor.  Pt reports his appetite is okay.  Typically only consumes 1 meal per day. His ex-wife lives with him and she cooks dinner. Now on Full Liquids. Previously on Clear Liquids. PO intake 100%.  Pt has had a 9% weight loss since 08/2016. Not significant for time frame. Medications reviewed and include MVI, folvite and thiamine. Labs reviewed. Na 129 (L). K 3.4 (L). CBG's 100-97-105.  NUTRITION - FOCUSED PHYSICAL EXAM:    Most Recent Value  Orbital Region  Unable to assess  Upper Arm Region  Unable to assess  Thoracic and Lumbar Region  Unable to assess  Buccal Region  Unable to assess  Temple Region  Unable to assess  Clavicle Bone Region  Unable to assess  Clavicle and Acromion Bone Region  Unable to assess  Scapular Bone Region  Unable to assess  Dorsal Hand  Unable to assess  Patellar Region  Unable to assess  Posterior Calf Region  Unable to assess  Edema (RD Assessment)  Unable to assess     Diet Order:  Diet full liquid Room service appropriate? Yes; Fluid consistency: Thin  EDUCATION NEEDS:   No education needs have been identified at this time  Skin:  Skin  Assessment: Reviewed RN Assessment  Last BM:  10/24  Height:   Ht Readings from Last 1 Encounters:  05/03/17 5\' 11"  (1.803 m)   Weight:   Wt Readings from Last 1 Encounters:  05/04/17 161 lb 3.2 oz (73.1 kg)   Wt Readings from Last 10 Encounters:  05/04/17 161 lb 3.2 oz (73.1 kg)  09/20/16 169 lb (76.7 kg)  08/23/16 166 lb (75.3 kg)  08/16/16 178 lb (80.7 kg)  02/24/16 163 lb (73.9 kg)  02/03/16 163 lb (73.9 kg)  08/30/15 163 lb (73.9 kg)  08/16/15 171 lb (77.6 kg)  07/28/15 169 lb 12.8 oz (77 kg)  05/24/15 172 lb (78 kg)   Ideal Body Weight:  78.1 kg  BMI:  Body mass index is 22.48 kg/m.  Estimated Nutritional Needs:   Kcal:  2000-2200  Protein:  100-115 gm  Fluid:  per MD  Arthur Holms, RD, LDN Pager #: [phone] After-Hours Pager #: [phone]

## 2017-05-04 NOTE — Progress Notes (Signed)
Po Barium Started for ct Scan

## 2017-05-04 NOTE — Progress Notes (Signed)
Pt eating with tolerance

## 2017-05-04 NOTE — Progress Notes (Signed)
Pt finished all but 120cc of barium

## 2017-05-04 NOTE — Progress Notes (Addendum)
[facility]                                                                              Patient Demographics  Tanner [name], is a 61 y.o. male, DOB - 1956-01-13, OAC:[id number]  Admit date - 05/02/2017   Admitting Physician Tanner Patience, MD  Outpatient Primary MD for the patient is Tanner Circle, [name]  Outpatient specialists:   LOS - 1  days   Medical records reviewed and are as summarized below:    Chief Complaint  Patient presents with  . Near Syncope       Brief summary   Per admit note by Dr. Hal [name] on 10/25 Tanner [name] is a 61 y.o. male with history of alcohol abuse, hypertension, hyperlipidemia presents to the ER the patient had an episode of loss of consciousness.  Patient states last evening at home when he was walking towards the bathroom he felt diaphoretic cold and clammy and passed out and his wife held him and did not hit the floor.  Patient states over the last 4 days he has been having some chest pressure and also has been having some nausea vomiting and diarrhea denies using any recent antibiotics or recent sick contacts.  Denies any recent travel. CT angiogram showed spiculated mass in the lung, multiple nodular lesions.  Sodium 120 at the time of admission  Assessment & Plan    Principal Problem:   Near syncope -Possibly due to dehydration, hyponatremia, nausea, vomiting, diarrhea.  Sodium 120 at the time of admission -Troponins x2 negative, one positive troponin of 0.06, subsequent troponins negative - 2D echo showed EF of 01-09%, grade 1 diastolic dysfunction -Sodium improving 129  Active Problems:   Lung mass, dyspnea with chest pressure, atypical pneumonia -Has a history of smoking, quit in August  2018 -Troponins x2-, CT angiogram of the chest showed no PE, scattered nodular opacities throughout both lungs, concerning for possible atypical pneumonia.  Spiculated left upper lobe pulmonary nodule new from 2017, measures  2.2 x 2.1 x 1.4cm.  Question of lytic lesions in the vertebral bodies T1 and T2, recommended bone scan or PET/CT -Continue IV Zithromax, Rocephin, there is a possibility of aspiration given his intractable nausea and vomiting - Urine strep antigen negative, respiratory virus panel pending -Appreciate pulmonology recommendations, patient will have outpatient PET/CT and biopsy -MRI of the brain did not show any metastasis, MRI of the T-spine showed numerous thoracic osseous metastasis without pathology, fractures.  Old mild 3, old mild T4 and moderate to severe old T8 compression fractures - PSA 0, has a history of prostatectomy.  Obtain CT abdomen pelvis for further workup to rule out any intra-abdominal mets    Hyponatremia, hypoosmolar, nausea, vomiting -Ordered serum osmolarity, calculated osmolality 253.  Also having nausea, vomiting, diarrhea for the last few days.  Urine osmolarity 342, UNA 90 -Sodium improving -Advance diet to full liquids    Malignant neoplasm of prostate ([place]) -Followed by Tanner [name] of urology, outpatient.  Per patient in remission.  PSA 0.0 in February 2018  - will recheck PSA     Essential hypertension -Currently stable, continue amlodipine,  lisinopril, metoprolol    Alcohol dependence ([place]) -States he drinks 6-8 beers every day, placed on CIWA with ativan  -CIWA this morning 6, attributed to nausea, vomiting, anxiety, agitation    Tobacco dependence -States quit in August 2018  Diarrhea -Rule out C. difficile   Code Status: full code  DVT Prophylaxis:  SCD's Family Communication: Discussed in detail with the patient, all imaging results, lab results explained to the patient   Disposition Plan: Possibly in a.m. if improving  Time Spent in minutes   35 minutes  Procedures:  CTA chest   Consultants:   Pulmonology   Antimicrobials:   IV Zithromax 10/25>  IV Rocephin 10/25   Medications  Scheduled Meds: . amLODipine  10 mg Oral Daily  .  feeding supplement  1 Container Oral TID BM  . folic acid  1 mg Oral Daily  . iopamidol      . lisinopril  40 mg Oral Daily  . metoprolol tartrate  100 mg Oral BID  . multivitamin with minerals  1 tablet Oral Daily  . pantoprazole (PROTONIX) IV  40 mg Intravenous Q24H  . simvastatin  20 mg Oral q1800  . thiamine  100 mg Oral Daily   Or  . thiamine  100 mg Intravenous Daily   Continuous Infusions: . sodium chloride 100 mL/hr at 05/03/17 1816  . azithromycin Stopped (05/04/17 0749)  . cefTRIAXone (ROCEPHIN)  IV Stopped (05/04/17 0719)   PRN Meds:.acetaminophen **OR** acetaminophen, diphenhydrAMINE-zinc acetate, LORazepam **OR** LORazepam, promethazine, traZODone   Antibiotics   Anti-infectives    Start     Dose/Rate Route Frequency Ordered Stop   05/04/17 0500  azithromycin (ZITHROMAX) 500 mg in dextrose 5 % 250 mL IVPB     500 mg 250 mL/hr over 60 Minutes Intravenous Every 24 hours 05/03/17 0846     05/04/17 0500  cefTRIAXone (ROCEPHIN) 1 g in dextrose 5 % 50 mL IVPB     1 g 100 mL/hr over 30 Minutes Intravenous Every 24 hours 05/03/17 0942     05/03/17 0315  cefTRIAXone (ROCEPHIN) 1 g in dextrose 5 % 50 mL IVPB     1 g 100 mL/hr over 30 Minutes Intravenous  Once 05/03/17 0308 05/03/17 0418   05/03/17 0315  azithromycin (ZITHROMAX) 500 mg in dextrose 5 % 250 mL IVPB     500 mg 250 mL/hr over 60 Minutes Intravenous  Once 05/03/17 0308 05/03/17 0524        Subjective:   Tanner [name] was seen and examined today. Still weak overall.  No back pain or focal weakness.  Patient denies dizziness, chest pain, shortness of breath, new weakness, numbess, tingling.  States that she still has nausea but no vomiting, states also having diarrhea.     Objective:   Vitals:   05/03/17 2359 05/04/17 0458 05/04/17 0701 05/04/17 1035  BP: (!) 149/81 133/81 (!) 141/87 (!) 155/87  Pulse: 72 76 77   Resp: 18 18    Temp: 98.6 F (37 C) 98.5 F (36.9 C)    TempSrc: Oral Oral    SpO2: 96%  96% 96% 98%  Weight:  73.1 kg (161 lb 3.2 oz)    Height:        Intake/Output Summary (Last 24 hours) at 05/04/17 1110 Last data filed at 05/04/17 1048  Gross per 24 hour  Intake          2731.67 ml  Output  1100 ml  Net          1631.67 ml     Wt Readings from Last 3 Encounters:  05/04/17 73.1 kg (161 lb 3.2 oz)  09/20/16 76.7 kg (169 lb)  08/23/16 75.3 kg (166 lb)     Exam   General: Alert and oriented x 3, NAD  Eyes:  HEENT:  Atraumatic, normocephalic  Cardiovascular: S1 S2 auscultated, RRR. No pedal edema b/l  Respiratory: Clear to auscultation bilaterally, no wheezing, rales or rhonchi  Gastrointestinal: Soft, nontender, nondistended, + bowel sounds  Ext: no pedal edema bilaterally  Neuro: no neurological deficits  Musculoskeletal: No digital cyanosis, clubbing  Skin: No rashes  Psych: Normal affect and demeanor, alert and oriented x3   Data Reviewed:  I have personally reviewed following labs and imaging studies  Micro Results Recent Results (from the past 240 hour(s))  Culture, expectorated sputum-assessment     Status: None   Collection Time: 05/04/17  7:50 AM  Result Value Ref Range Status   Specimen Description SPUTUM  Final   Special Requests NONE  Final   Sputum evaluation THIS SPECIMEN IS ACCEPTABLE FOR SPUTUM CULTURE  Final   Report Status 05/04/2017 FINAL  Final    Radiology Reports Dg Chest 2 View  Result Date: 05/02/2017 CLINICAL DATA:  61 year old male with syncope and chest tightness. EXAM: CHEST  2 VIEW COMPARISON:  Chest radiograph dated 08/16/2016 and chest CT dated 07/23/2015 FINDINGS: The lungs are clear. There is no pleural effusion or pneumothorax. The cardiac silhouette is within normal limits. Appy there is osteopenia with degenerative changes of the spine. Old midthoracic compression deformity similar to the CT of 07/23/2015 none no acute osseous pathology. IMPRESSION: No active cardiopulmonary disease.  Electronically Signed   By: Anner Crete M.D.   On: 05/02/2017 23:43   Ct Angio Chest Pe W And/or Wo Contrast  Result Date: 05/03/2017 CLINICAL DATA:  Acute onset of shortness of breath. Initial encounter. EXAM: CT ANGIOGRAPHY CHEST WITH CONTRAST TECHNIQUE: Multidetector CT imaging of the chest was performed using the standard protocol during bolus administration of intravenous contrast. Multiplanar CT image reconstructions and MIPs were obtained to evaluate the vascular anatomy. There was partial extravasation of 40 mL of contrast into the patient's right antecubital fossa; the extravasation site was evaluated by the radiologist. An additional peripheral IV catheter could not be successfully placed. CONTRAST:  60 mL of Isovue 370 IV contrast COMPARISON:  Chest radiograph performed 05/02/2017, and CTA of the chest performed 07/23/2015 FINDINGS: Cardiovascular: There is no evidence of central pulmonary embolus. Evaluation for pulmonary embolus is suboptimal due to partial extravasation of the contrast bolus. The heart is normal in size. The thoracic aorta demonstrates minimal calcification. Mild calcification is seen along the proximal great vessels. Mediastinum/Nodes: There is a spiculated left upper lobe pulmonary nodule abutting the anterior aspect of the mediastinum, measuring approximately 2.2 x 2.1 x 1.4 cm and concerning for malignancy. This is new from 2017. The mediastinum is otherwise unremarkable. No mediastinal lymphadenopathy is seen. No pericardial effusion is identified. The visualized portions of the thyroid gland are unremarkable. No axillary lymphadenopathy is seen. Lungs/Pleura: Scattered nodular opacities are seen throughout both lungs. There is suggestion of a miliary pattern, though some of the opacities are more diffuse. This may reflect atypical pneumonia. Fungal pneumonia or viral pneumonitis cannot be excluded. Tuberculosis is considered less likely. No pleural effusion or  pneumothorax is seen. Upper Abdomen: The visualized portions of the liver and spleen are unremarkable.  Musculoskeletal: No acute osseous abnormalities are identified. There is question of lytic lesions within vertebral bodies T1 and T2, apparently new from 2017. There is chronic loss of height at vertebral bodies T8 and T9. The visualized musculature is unremarkable in appearance. Review of the MIP images confirms the above findings. IMPRESSION: 1. No evidence of central pulmonary embolus. 2. Scattered nodular opacities throughout both lungs. Suggestion of a miliary pattern, though some of the opacities are more diffuse. This is concerning for atypical pneumonia. Fungal pneumonia or viral pneumonitis cannot be excluded. Tuberculosis is considered less likely. Would correlate with the patient's symptoms. 3. Spiculated left upper lobe pulmonary nodule abutting the anterior aspect of the mediastinum, new from 2017. This measures 2.2 x 2.1 x 1.4 cm, and is concerning for primary bronchogenic malignancy. Tissue diagnosis and/or PET/CT would be helpful for further evaluation. 4. Question of lytic lesions within vertebral bodies T1 and T2, apparently new from 2017. This could be further assessed on bone scan or the PET/CT, as deemed clinically appropriate. Electronically Signed   By: Garald Balding M.D.   On: 05/03/2017 02:08   Mr Jeri Cos GU Contrast  Result Date: 05/03/2017 CLINICAL DATA:  Syncopal episode on way to bathroom. No head injury. Feeling unwell for 4 days. History of LEFT lung mass, alcohol abuse, hypertension, hyperlipidemia. EXAM: MRI HEAD WITHOUT AND WITH CONTRAST TECHNIQUE: Multiplanar, multiecho pulse sequences of the brain and surrounding structures were obtained without and with intravenous contrast. CONTRAST:  68mL MULTIHANCE GADOBENATE DIMEGLUMINE 529 MG/ML IV SOLN COMPARISON:  CT HEAD August 16, 2016 FINDINGS: BRAIN: No reduced diffusion to suggest acute ischemia or hypercellular tumor. No  susceptibility artifact to suggest hemorrhage. Patchy supratentorial white matter FLAIR T2 hyperintensities. Mild ex vacuo dilatation LEFT lateral ventricle, ventricles and sulci are overall mildly prominent for patient's age. LEFT midbrain prominent perivascular space, normal variant. No suspicious parenchymal signal, mass or mass effect. No abnormal intraparenchymal or extra-axial enhancement. No abnormal extra-axial fluid collections. VASCULAR: Normal major intracranial vascular flow voids present at skull base. SKULL AND UPPER CERVICAL SPINE: No abnormal sellar expansion. No suspicious calvarial bone marrow signal. Craniocervical junction maintained. SINUSES/ORBITS: The mastoid air-cells and included paranasal sinuses are well-aerated. The included ocular globes and orbital contents are non-suspicious. OTHER: None. IMPRESSION: 1. No acute intracranial process or metastasis. 2. Mild parenchymal brain volume loss for age. 3. Mild chronic small vessel ischemic disease. Electronically Signed   By: Elon Alas M.D.   On: 05/03/2017 23:56   Mr Thoracic Spine W Wo Contrast  Result Date: 05/04/2017 CLINICAL DATA:  Syncopal episode on way to bathroom. No head injury. Feeling unwell for 4 days. History of LEFT lung mass, alcohol abuse, hypertension, hyperlipidemia. EXAM: MRI THORACIC WITHOUT AND WITH CONTRAST TECHNIQUE: Multiplanar and multiecho pulse sequences of the thoracic spine were obtained without and with intravenous contrast. CONTRAST:  81mL MULTIHANCE GADOBENATE DIMEGLUMINE 529 MG/ML IV SOLN COMPARISON:  CT chest May 03, 2017 at 0117 hours FINDINGS: Mildly motion degraded examination. ALIGNMENT: Maintenance of the thoracic kyphosis. No malalignment. VERTEBRAE/DISCS: Vertebral bodies are intact. Multiple low T1, I bright T2 osseous metastasis, predominately enhancing metastasis stoma which demonstrate central necrosis at T2, T4, T7, T9, T10, T12, L1 and L2. No pathologic fracture. Old mild T3, T4,  moderate to severe T8 compression fracture. Disc desiccation all levels with multilevel mid to lower thoracic moderate chronic discogenic endplate changes. CORD: Thoracic spinal cord is normal morphology and signal characteristics to the level of the conus medullaris which terminates at  T12-L1. Axial sequences are mildly motion degraded, multiple tiny foci T1 shortening most compatible with flow void without convincing evidence of metastasis. PREVERTEBRAL AND PARASPINAL SOFT TISSUES: Faint enhancement RIGHT T4-5 inner costal space of equivocal for metastasis (sagittal 1/17) DISC LEVELS: Multilevel small broad-based disc bulges without canal stenosis or neural foraminal narrowing at any level. IMPRESSION: 1. 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 2. Old mild T3, old mild T4 and moderate to severe old T8 compression fractures. 3. No significant canal stenosis or neural foraminal narrowing at any level. Electronically Signed   By: Elon Alas M.D.   On: 05/04/2017 00:10    Lab Data:  CBC:  Recent Labs Lab 05/02/17 2235 05/03/17 0520 05/04/17 0501  WBC 10.5 8.8 7.2  HGB 12.7* 11.7* 12.0*  HCT 35.6* 33.5* 34.9*  MCV 95.7 95.7 96.7  PLT 261 212 267   Basic Metabolic Panel:  Recent Labs Lab 05/02/17 2235 05/03/17 0520 05/03/17 0818 05/03/17 1231 05/04/17 0501  NA 120* 123* 124* 126* 129*  K 3.8 3.8 3.7 3.6 3.4*  CL 87* 90* 90* 93* 91*  CO2 20* 22 23 23 28   GLUCOSE 74 191* 105* 97 100*  BUN 9 7 7  5* <5*  CREATININE 0.94 0.75 0.72 0.73 0.68  CALCIUM 8.4* 7.7* 8.0* 8.4* 9.2  MG  --  1.4*  --   --   --    GFR: Estimated Creatinine Clearance: 104.1 mL/min (by C-G formula based on SCr of 0.68 mg/dL). Liver Function Tests: No results for input(s): AST, ALT, ALKPHOS, BILITOT, PROT, ALBUMIN in the last 168 hours. No results for input(s): LIPASE, AMYLASE in the last 168 hours. No results for input(s): AMMONIA in the last 168 hours. Coagulation Profile: No  results for input(s): INR, PROTIME in the last 168 hours. Cardiac Enzymes:  Recent Labs Lab 05/03/17 0520 05/03/17 1231 05/03/17 1823  TROPONINI <0.03 0.06* <0.03   BNP (last 3 results) No results for input(s): PROBNP in the last 8760 hours. HbA1C: No results for input(s): HGBA1C in the last 72 hours. CBG: No results for input(s): GLUCAP in the last 168 hours. Lipid Profile: No results for input(s): CHOL, HDL, LDLCALC, TRIG, CHOLHDL, LDLDIRECT in the last 72 hours. Thyroid Function Tests:  Recent Labs  05/03/17 0521  TSH 1.225   Anemia Panel: No results for input(s): VITAMINB12, FOLATE, FERRITIN, TIBC, IRON, RETICCTPCT in the last 72 hours. Urine analysis:    Component Value Date/Time   COLORURINE STRAW (A) 05/02/2017 2317   APPEARANCEUR CLEAR 05/02/2017 2317   LABSPEC 1.010 05/02/2017 2317   PHURINE 6.0 05/02/2017 2317   GLUCOSEU NEGATIVE 05/02/2017 2317   HGBUR NEGATIVE 05/02/2017 2317   BILIRUBINUR NEGATIVE 05/02/2017 2317   KETONESUR NEGATIVE 05/02/2017 2317   PROTEINUR NEGATIVE 05/02/2017 2317   UROBILINOGEN 0.2 09/26/2013 1457   NITRITE NEGATIVE 05/02/2017 2317   LEUKOCYTESUR NEGATIVE 05/02/2017 2317     Quetzal Meany M.D. [facility] 05/04/2017, 11:10 AM  Pager: [phone] Between 7am to 7pm - call Pager - [phone]  After 7pm go to [email or web address] - password [email or web address]  Call night coverage person covering after 7pm

## 2017-05-04 NOTE — Progress Notes (Signed)
CT Supervisor spoke to [facility] RN. No increased swelling, no redness, no problems at site.

## 2017-05-04 NOTE — Progress Notes (Addendum)
Name: Tanner [name] MRN: [id number] DOB: 1955/08/24    ADMISSION DATE:  05/02/2017 CONSULTATION DATE:  10/25  REFERRING MD :  Tana Coast ([facility])   CHIEF COMPLAINT:  Lung mass   BRIEF PATIENT DESCRIPTION: 61 y.o. male smoker (quit August 2018) with hx remote prostate cancer,  ETOH abuse, HTN presented 10/25 with near syncope, chest pressure, n/v/d.  ER w/u revealed Na 120, neg pct, neg troponin, hemodynamically stable.  CTA chest was neg for PE but revealed scattered nodular opacities as well as spiculated LUL mass and questionable T1 and T2 vertebral body lesions concerned for metastasis.   SUBJECTIVE:  Continuing to endorse an intermittent cough. Denies any chest pain or pressure. No new dyspnea.  REVIEW OF SYSTEMS:  No subjective fever or chills. No abdominal pain or nausea.  VITAL SIGNS: Temp:  [98.2 F (36.8 C)-99.2 F (37.3 C)] 98.5 F (36.9 C) (10/26 0458) Pulse Rate:  [72-89] 77 (10/26 0701) Resp:  [13-27] 18 (10/26 0458) BP: (133-160)/(81-97) 141/87 (10/26 0701) SpO2:  [95 %-98 %] 96 % (10/26 0701) Weight:  [161 lb 3.2 oz (73.1 kg)-163 lb 8 oz (74.2 kg)] 161 lb 3.2 oz (73.1 kg) (10/26 0458)  PHYSICAL EXAMINATION: Gen.: Sitting up in bed. No distress. Awake. Integument: Warm. Dry. No rash. Pulmonary: Normal work of breathing. Mostly clear with auscultation. Lymphatics: No appreciated cervical or supraclavicular lymphadenopathy. Cardiovascular: Regular rate. Regular rhythm. No edema.    Recent Labs Lab 05/03/17 0818 05/03/17 1231 05/04/17 0501  NA 124* 126* 129*  K 3.7 3.6 3.4*  CL 90* 93* 91*  CO2 23 23 28   BUN 7 5* <5*  CREATININE 0.72 0.73 0.68  GLUCOSE 105* 97 100*    Recent Labs Lab 05/02/17 2235 05/03/17 0520 05/04/17 0501  HGB 12.7* 11.7* 12.0*  HCT 35.6* 33.5* 34.9*  WBC 10.5 8.8 7.2  PLT 261 212 252   Dg Chest 2 View  Result Date: 05/02/2017 CLINICAL DATA:  61 year old male with syncope and chest tightness. EXAM: CHEST  2 VIEW COMPARISON:  Chest  radiograph dated 08/16/2016 and chest CT dated 07/23/2015 FINDINGS: The lungs are clear. There is no pleural effusion or pneumothorax. The cardiac silhouette is within normal limits. Appy there is osteopenia with degenerative changes of the spine. Old midthoracic compression deformity similar to the CT of 07/23/2015 none no acute osseous pathology. IMPRESSION: No active cardiopulmonary disease. Electronically Signed   By: Anner Crete M.D.   On: 05/02/2017 23:43   Ct Angio Chest Pe W And/or Wo Contrast  Result Date: 05/03/2017 CLINICAL DATA:  Acute onset of shortness of breath. Initial encounter. EXAM: CT ANGIOGRAPHY CHEST WITH CONTRAST TECHNIQUE: Multidetector CT imaging of the chest was performed using the standard protocol during bolus administration of intravenous contrast. Multiplanar CT image reconstructions and MIPs were obtained to evaluate the vascular anatomy. There was partial extravasation of 40 mL of contrast into the patient's right antecubital fossa; the extravasation site was evaluated by the radiologist. An additional peripheral IV catheter could not be successfully placed. CONTRAST:  60 mL of Isovue 370 IV contrast COMPARISON:  Chest radiograph performed 05/02/2017, and CTA of the chest performed 07/23/2015 FINDINGS: Cardiovascular: There is no evidence of central pulmonary embolus. Evaluation for pulmonary embolus is suboptimal due to partial extravasation of the contrast bolus. The heart is normal in size. The thoracic aorta demonstrates minimal calcification. Mild calcification is seen along the proximal great vessels. Mediastinum/Nodes: There is a spiculated left upper lobe pulmonary nodule abutting the anterior aspect of the  mediastinum, measuring approximately 2.2 x 2.1 x 1.4 cm and concerning for malignancy. This is new from 2017. The mediastinum is otherwise unremarkable. No mediastinal lymphadenopathy is seen. No pericardial effusion is identified. The visualized portions of the  thyroid gland are unremarkable. No axillary lymphadenopathy is seen. Lungs/Pleura: Scattered nodular opacities are seen throughout both lungs. There is suggestion of a miliary pattern, though some of the opacities are more diffuse. This may reflect atypical pneumonia. Fungal pneumonia or viral pneumonitis cannot be excluded. Tuberculosis is considered less likely. No pleural effusion or pneumothorax is seen. Upper Abdomen: The visualized portions of the liver and spleen are unremarkable. Musculoskeletal: No acute osseous abnormalities are identified. There is question of lytic lesions within vertebral bodies T1 and T2, apparently new from 2017. There is chronic loss of height at vertebral bodies T8 and T9. The visualized musculature is unremarkable in appearance. Review of the MIP images confirms the above findings. IMPRESSION: 1. No evidence of central pulmonary embolus. 2. Scattered nodular opacities throughout both lungs. Suggestion of a miliary pattern, though some of the opacities are more diffuse. This is concerning for atypical pneumonia. Fungal pneumonia or viral pneumonitis cannot be excluded. Tuberculosis is considered less likely. Would correlate with the patient's symptoms. 3. Spiculated left upper lobe pulmonary nodule abutting the anterior aspect of the mediastinum, new from 2017. This measures 2.2 x 2.1 x 1.4 cm, and is concerning for primary bronchogenic malignancy. Tissue diagnosis and/or PET/CT would be helpful for further evaluation. 4. Question of lytic lesions within vertebral bodies T1 and T2, apparently new from 2017. This could be further assessed on bone scan or the PET/CT, as deemed clinically appropriate. Electronically Signed   By: Garald Balding M.D.   On: 05/03/2017 02:08   Mr Jeri Cos WG Contrast  Result Date: 05/03/2017 CLINICAL DATA:  Syncopal episode on way to bathroom. No head injury. Feeling unwell for 4 days. History of LEFT lung mass, alcohol abuse, hypertension,  hyperlipidemia. EXAM: MRI HEAD WITHOUT AND WITH CONTRAST TECHNIQUE: Multiplanar, multiecho pulse sequences of the brain and surrounding structures were obtained without and with intravenous contrast. CONTRAST:  67mL MULTIHANCE GADOBENATE DIMEGLUMINE 529 MG/ML IV SOLN COMPARISON:  CT HEAD August 16, 2016 FINDINGS: BRAIN: No reduced diffusion to suggest acute ischemia or hypercellular tumor. No susceptibility artifact to suggest hemorrhage. Patchy supratentorial white matter FLAIR T2 hyperintensities. Mild ex vacuo dilatation LEFT lateral ventricle, ventricles and sulci are overall mildly prominent for patient's age. LEFT midbrain prominent perivascular space, normal variant. No suspicious parenchymal signal, mass or mass effect. No abnormal intraparenchymal or extra-axial enhancement. No abnormal extra-axial fluid collections. VASCULAR: Normal major intracranial vascular flow voids present at skull base. SKULL AND UPPER CERVICAL SPINE: No abnormal sellar expansion. No suspicious calvarial bone marrow signal. Craniocervical junction maintained. SINUSES/ORBITS: The mastoid air-cells and included paranasal sinuses are well-aerated. The included ocular globes and orbital contents are non-suspicious. OTHER: None. IMPRESSION: 1. No acute intracranial process or metastasis. 2. Mild parenchymal brain volume loss for age. 3. Mild chronic small vessel ischemic disease. Electronically Signed   By: Elon Alas M.D.   On: 05/03/2017 23:56   Mr Thoracic Spine W Wo Contrast  Result Date: 05/04/2017 CLINICAL DATA:  Syncopal episode on way to bathroom. No head injury. Feeling unwell for 4 days. History of LEFT lung mass, alcohol abuse, hypertension, hyperlipidemia. EXAM: MRI THORACIC WITHOUT AND WITH CONTRAST TECHNIQUE: Multiplanar and multiecho pulse sequences of the thoracic spine were obtained without and with intravenous contrast. CONTRAST:  79mL MULTIHANCE  GADOBENATE DIMEGLUMINE 529 MG/ML IV SOLN COMPARISON:  CT  chest May 03, 2017 at 0117 hours FINDINGS: Mildly motion degraded examination. ALIGNMENT: Maintenance of the thoracic kyphosis. No malalignment. VERTEBRAE/DISCS: Vertebral bodies are intact. Multiple low T1, I bright T2 osseous metastasis, predominately enhancing metastasis stoma which demonstrate central necrosis at T2, T4, T7, T9, T10, T12, L1 and L2. No pathologic fracture. Old mild T3, T4, moderate to severe T8 compression fracture. Disc desiccation all levels with multilevel mid to lower thoracic moderate chronic discogenic endplate changes. CORD: Thoracic spinal cord is normal morphology and signal characteristics to the level of the conus medullaris which terminates at T12-L1. Axial sequences are mildly motion degraded, multiple tiny foci T1 shortening most compatible with flow void without convincing evidence of metastasis. PREVERTEBRAL AND PARASPINAL SOFT TISSUES: Faint enhancement RIGHT T4-5 inner costal space of equivocal for metastasis (sagittal 1/17) DISC LEVELS: Multilevel small broad-based disc bulges without canal stenosis or neural foraminal narrowing at any level. IMPRESSION: 1. 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 2. Old mild T3, old mild T4 and moderate to severe old T8 compression fractures. 3. No significant canal stenosis or neural foraminal narrowing at any level. Electronically Signed   By: Elon Alas M.D.   On: 05/04/2017 00:10   STUDIES:  CTA CHEST 10/25: IMPRESSION: 1. No evidence of central pulmonary embolus. 2. Scattered nodular opacities throughout both lungs. Suggestion of a miliary pattern, though some of the opacities are more diffuse. This is concerning for atypical pneumonia. Fungal pneumonia or viral pneumonitis cannot be excluded. Tuberculosis is considered less likely. Would correlate with the patient's symptoms. 3. Spiculated left upper lobe pulmonary nodule abutting the anterior aspect of the mediastinum, new from 2017. This  measures 2.2 x 2.1 x 1.4 cm, and is concerning for primary bronchogenic malignancy. Tissue diagnosis and/or PET/CT would be helpful for further evaluation. 4. Question of lytic lesions within vertebral bodies T1 and T2, apparently new from 2017. This could be further assessed on bone scan or the PET/CT, as deemed clinically appropriate.  MRI BRAIN W/ & W/O CONTRAST 10/25:  No acute intracranial process or metastasis. Mild parenchymal brain volume loss for age. Mild chronic small vessel ischemic disease.  MRI THORACIC SPINE W/ & W/O CONTRAST 10/25:  Numerous thoracic osseous metastasis without pathologic fracture. Old mild T3, old mild T4 and moderate to severe old T8 compression fractures. No significant canal stenosis or neural foraminal narrowing at any level.  CT ABDOMEN/PELVIS W/ CONTRAST 10/26:  Suspect mild fatty infiltration of the liver. Small bilateral renal cysts. Aortoiliac atherosclerosis. Sigmoid diverticulosis. No acute findings.  MICROBIOLOGY: Urine Streptococcal Antigen 10/25:  Negative Urine Legionella Antigen 10/25 >>> Blood Cultures x2 10/25 >>> Sputum Culture 10/26 >>> Respiratory Viral Panel PCR 10/25 >>>  ANTIBIOTICS: Azithromycin 10/25 >>> Rocephin 10/25 >>>  ASSESSMENT / PLAN:  61 y.o. male with scattered bilateral nodular opacities as well as a spiculated left upper lobe nodule abutting the mediastinum. Reviewing his chest imaging does not indicate any evidence of pathologic lymphadenopathy. Therefore, if this is a malignant process, metastases to his lungs are the most likely sources for the nodules. However, an infectious process or even possibly an autoimmune process must be entertained.  1. Multiple lung nodules: Checking ANCA panel and ANA with reflex to comprehensive panel. Awaiting finalization of infectious workup. Continuing Rocephin & azithromycin for empiric coverage. 2. Left upper lobe nodule: Suggestion of metastases with MRI imaging. Doubtful this is  lung primary. Patient may require PET/CT imaging as an  outpatient.  3. Follow-up:  Arranged for follow-up in our office on 11/6 at 11:15am.   I have spent a total of 36 minutes of time today caring for the patient, reviewing the patient's electronic medical record, and with more than 50% of that time spent coordinating care with the patient as well as reviewing the continuing plan of care with the patient at bedside.  Remainder of care as per primary service and other consultants.  We will reassess the patient over the weekend. Please contact the rounding physician if there are any questions or concerns.   Sonia Baller Ashok Cordia, M.D. [facility] Pager:  [phone] After 7pm or if no response, call [phone] 05/04/2017  9:20 AM

## 2017-05-04 NOTE — Progress Notes (Signed)
Pt educated about safety and importance of bed alarm during the night however pt refuses to be on bed alarm. Will continue to round on patient.   Zylan Almquist, RN    

## 2017-05-05 ENCOUNTER — Telehealth: Payer: Self-pay | Admitting: Pulmonary Disease

## 2017-05-05 DIAGNOSIS — R911 Solitary pulmonary nodule: Secondary | ICD-10-CM

## 2017-05-05 LAB — CBC
HCT: 35.5 % — ABNORMAL LOW (ref 39.0–52.0)
Hemoglobin: 12.2 g/dL — ABNORMAL LOW (ref 13.0–17.0)
MCH: 33.4 pg (ref 26.0–34.0)
MCHC: 34.4 g/dL (ref 30.0–36.0)
MCV: 97.3 fL (ref 78.0–100.0)
Platelets: 203 10*3/uL (ref 150–400)
RBC: 3.65 MIL/uL — ABNORMAL LOW (ref 4.22–5.81)
RDW: 13.1 % (ref 11.5–15.5)
WBC: 5.7 10*3/uL (ref 4.0–10.5)

## 2017-05-05 LAB — BASIC METABOLIC PANEL
Anion gap: 11 (ref 5–15)
BUN: <5 mg/dL — ABNORMAL LOW (ref 6–20)
CO2: 26 mmol/L (ref 22–32)
Calcium: 9 mg/dL (ref 8.9–10.3)
Chloride: 96 mmol/L — ABNORMAL LOW (ref 101–111)
Creatinine, Ser: 0.62 mg/dL (ref 0.61–1.24)
GFR calc Af Amer: >60 mL/min (ref >60)
GFR calc non Af Amer: >60 mL/min (ref >60)
Glucose, Bld: 101 mg/dL — ABNORMAL HIGH (ref 65–99)
Potassium: 3.4 mmol/L — ABNORMAL LOW (ref 3.5–5.1)
Sodium: 133 mmol/L — ABNORMAL LOW (ref 135–145)

## 2017-05-05 LAB — C DIFFICILE QUICK SCREEN W PCR REFLEX
C DIFFICILE (CDIFF) TOXIN: NEGATIVE
C DIFFICLE (CDIFF) ANTIGEN: NEGATIVE
C Diff interpretation: NOT DETECTED

## 2017-05-05 LAB — PROCALCITONIN: Procalcitonin: <0.1 ng/mL

## 2017-05-05 LAB — MPO/PR-3 (ANCA) ANTIBODIES: ANCA Proteinase 3: <3.5 U/mL (ref 0.0–3.5)

## 2017-05-05 LAB — ANA W/REFLEX IF POSITIVE: ANA: NEGATIVE

## 2017-05-05 MED ORDER — POTASSIUM CHLORIDE CRYS ER 20 MEQ PO TBCR
40.0000 meq | EXTENDED_RELEASE_TABLET | Freq: Once | ORAL | Status: AC
  Administered 2017-05-05: 40 meq via ORAL
  Filled 2017-05-05: qty 2

## 2017-05-05 MED ORDER — DIAZEPAM 5 MG PO TABS: 5.0000 mg | ORAL_TABLET | Freq: Two times a day (BID) | ORAL | 0 refills | Status: DC | PRN

## 2017-05-05 MED ORDER — THIAMINE HCL 100 MG PO TABS: 100.0000 mg | ORAL_TABLET | Freq: Every day | ORAL | 3 refills | Status: DC

## 2017-05-05 MED ORDER — LOPERAMIDE HCL 2 MG PO CAPS: 4.0000 mg | ORAL_CAPSULE | Freq: Three times a day (TID) | ORAL | 0 refills | Status: DC | PRN

## 2017-05-05 MED ORDER — AZITHROMYCIN 500 MG PO TABS
500.0000 mg | ORAL_TABLET | Freq: Every day | ORAL | Status: DC
  Administered 2017-05-05: 500 mg via ORAL
  Filled 2017-05-05: qty 1

## 2017-05-05 MED ORDER — AZITHROMYCIN 500 MG PO TABS
500.0000 mg | ORAL_TABLET | Freq: Every day | ORAL | 0 refills | Status: DC
Start: 2017-05-05 — End: 2017-05-15

## 2017-05-05 MED ORDER — LOPERAMIDE HCL 2 MG PO CAPS: 4.0000 mg | ORAL_CAPSULE | Freq: Once | ORAL | Status: DC

## 2017-05-05 MED ORDER — CEFPODOXIME PROXETIL 200 MG PO TABS
200.0000 mg | ORAL_TABLET | Freq: Two times a day (BID) | ORAL | Status: DC
  Administered 2017-05-05: 200 mg via ORAL
  Filled 2017-05-05: qty 1

## 2017-05-05 MED ORDER — PANTOPRAZOLE SODIUM 40 MG PO TBEC
40.0000 mg | DELAYED_RELEASE_TABLET | Freq: Every day | ORAL | Status: DC
Start: 2017-05-05 — End: 2017-05-05

## 2017-05-05 MED ORDER — CEFPODOXIME PROXETIL 200 MG PO TABS: 200.0000 mg | ORAL_TABLET | Freq: Two times a day (BID) | ORAL | 0 refills | Status: DC

## 2017-05-05 NOTE — Telephone Encounter (Signed)
Patient has an appointment on 11/6 with Tammy.  Can we please see if we can arrange a PET-CT (skull to thigh, initial) for the patient to complete before he sees Tammy?  Thanks!  Noe Gens, NP-C [facility] Pgr: [phone] or if no answer [phone] 05/05/2017, 11:07 AM

## 2017-05-05 NOTE — Progress Notes (Signed)
Pt discharged home, IV and tele removed. Discharged instructions given questions answered. Wife at bedside.

## 2017-05-05 NOTE — Discharge Summary (Addendum)
Physician Discharge Summary   Patient ID: Tanner [name] MRN: [id number] DOB/AGE: 1955/09/07 61 y.o.  Admit date: 05/02/2017 Discharge date: 05/05/2017  Primary Care Physician:  Tanner Circle, [name]  Discharge Diagnoses:    . Near syncope Atypical pneumonia  T-spine metastasis . New lung mass . Malignant neoplasm of prostate ([place]) . Alcohol dependence ([place]) with alcohol withdrawals . Hyponatremia . Essential hypertension . Tobacco dependence . New lung mass   Consults: Pulmonology  Recommendations for Outpatient Follow-up:  1. Outpatient PET/CT scan will be arranged by pulmonology 2. Please repeat CBC/BMET at next visit   DIET: Heart healthy diet    Allergies:   Allergies  Allergen Reactions  . Dilaudid [Hydromorphone Hcl] Nausea And Vomiting    Pre pt history  . Oxycodone Nausea And Vomiting  . Procaine Hcl Other (See Comments)    Patient states he is allergic to novicaine  . Zofran [Ondansetron] Nausea And Vomiting  . Sulfa Antibiotics Rash     DISCHARGE MEDICATIONS: Current Discharge Medication List    START taking these medications   Details  azithromycin (ZITHROMAX) 500 MG tablet Take 1 tablet (500 mg total) by mouth daily. X 7 days Qty: 7 tablet, Refills: 0    cefpodoxime (VANTIN) 200 MG tablet Take 1 tablet (200 mg total) by mouth 2 (two) times daily. X 7 days Qty: 14 tablet, Refills: 0    diazepam (VALIUM) 5 MG tablet Take 1 tablet (5 mg total) by mouth 2 (two) times daily as needed for anxiety or muscle spasms. Qty: 30 tablet, Refills: 0    loperamide (IMODIUM) 2 MG capsule Take 2 capsules (4 mg total) by mouth every 8 (eight) hours as needed for diarrhea or loose stools. Over the counter Qty: 30 capsule, Refills: 0    thiamine 100 MG tablet Take 1 tablet (100 mg total) by mouth daily. Qty: 30 tablet, Refills: 3      CONTINUE these medications which have NOT CHANGED   Details  amLODipine (NORVASC) 10 MG tablet Take 1 tablet (10 mg  total) by mouth daily. Qty: 90 tablet, Refills: 1    FLUoxetine (PROZAC) 40 MG capsule TAKE 1 CAPSULE BY MOUTH EVERY DAY Qty: 30 capsule, Refills: 3    lisinopril (PRINIVIL,ZESTRIL) 40 MG tablet TAKE ONE (1) TABLET BY MOUTH EVERY DAY Qty: 90 tablet, Refills: 1    metoprolol tartrate (LOPRESSOR) 100 MG tablet TAKE 1 TABLET BY MOUTH TWICE A DAY Qty: 60 tablet, Refills: 0   Associated Diagnoses: Essential hypertension, benign    omeprazole (PRILOSEC) 40 MG capsule TAKE 1 CAPSULE BY MOUTH TWICE A DAY Qty: 120 capsule, Refills: 0    simvastatin (ZOCOR) 20 MG tablet TAKE ONE (1) TABLET BY MOUTH EVERY DAY Qty: 90 tablet, Refills: 1    traZODone (DESYREL) 50 MG tablet TAKE 1 TABLET BY MOUTH AT BEDTIME AS NEEDED FOR SLEEP Qty: 30 tablet, Refills: 0      STOP taking these medications     clonazePAM (KLONOPIN) 1 MG tablet          Brief H and P: For complete details please refer to admission H and P, but in brief Per admit note by Tanner [name] on 10/25 Tanner [name] a 61 y.o.malewith history of alcohol abuse, hypertension, hyperlipidemia presents to the ER the patient had an episode of loss of consciousness. Patient states last evening at home when he was walking towards the bathroom he felt diaphoretic cold and clammy and passed out and his wife held him and  did not hit the floor. Patient states over the last 4 days he has been having some chest pressure and also has been having some nausea vomiting and diarrhea denies using any recent antibiotics or recent sick contacts. Denies any recent travel. CT angiogram showed spiculated mass in the lung, multiple nodular lesions.  Sodium 120 at the time of admission.  Hospital Course:   Near syncope -Possibly due to dehydration, hyponatremia, nausea, vomiting, diarrhea.  Sodium 120 at the time of admission -Troponins x2 negative, one positive troponin of 0.06, subsequent troponins negative - 2D echo showed EF of 55-60%, grade 1  diastolic dysfunction -Sodium improving 133 at discharge     Lung mass, dyspnea with chest pressure, atypical pneumonia, lytic lesions in thoracic spine -Has a history of smoking, quit in August  2018 -Troponins x2-, CT angiogram of the chest showed no PE, scattered nodular opacities throughout both lungs, concerning for possible atypical pneumonia.  Spiculated left upper lobe pulmonary nodule new from 2017, measures 2.2 x 2.1 x 1.4cm.  Question of lytic lesions in the vertebral bodies T1 and T2, recommended bone scan or PET/CT -Patient was placed on IV Zithromax and Rocephin, there is a possibility of aspiration given his intractable nausea and vomiting.  Transition to oral Zithromax and Vantin for 7 days - Urine strep antigen negative, respiratory virus panel pending -Appreciate pulmonology recommendations, patient will have outpatient PET/CT and biopsy -MRI of the brain did not show any metastasis, MRI of the T-spine showed numerous thoracic osseous metastasis without pathology, fractures.  Old mild 3, old mild T4 and moderate to severe old T8 compression fractures - PSA 0, has a history of prostatectomy.   -CT abdomen pelvis did not show any metastases    Hyponatremia, hypoosmolar, nausea, vomiting -Serum osmolarity 263, Also having nausea, vomiting, diarrhea for the last few days.  Urine osmolarity 342, UNA 90 -Sodium improving, patient was placed on IV fluids. -Tolerating regular diet    Malignant neoplasm of prostate ([place]) -Followed by Dr. Risa [name] of urology, outpatient.  Per patient in remission.  PSA 0.0 in February 2018  -PSA 0     Essential hypertension -Currently stable, continue amlodipine, lisinopril, metoprolol    Alcohol dependence ([place]) -States he drinks 6-8 beers every day, placed on CIWA with ativan while in patient -CIWA this morning 0, placed on Valium at discharge (used to take Klonopin, which does not work anymore per patient)    Tobacco dependence -States  quit in August 2018  Diarrhea -C. difficile negative    Day of Discharge BP (!) 140/95 (BP Location: Right Arm)   Pulse 74   Temp 98 F (36.7 C) (Oral)   Resp 18   Ht 5\' 11"  (1.803 m)   Wt 72.9 kg (160 lb 12.8 oz)   SpO2 98%   BMI 22.43 kg/m   Physical Exam: General: Alert and awake oriented x3 not in any acute distress. HEENT: anicteric sclera, pupils reactive to light and accommodation CVS: S1-S2 clear no murmur rubs or gallops Chest: clear to auscultation bilaterally, no wheezing rales or rhonchi Abdomen: soft nontender, nondistended, normal bowel sounds Extremities: no cyanosis, clubbing or edema noted bilaterally Neuro: Cranial nerves II-XII intact, no focal neurological deficits   The results of significant diagnostics from this hospitalization (including imaging, microbiology, ancillary and laboratory) are listed below for reference.    LAB RESULTS: Basic Metabolic Panel:  Recent Labs Lab 05/03/17 0520  05/04/17 0501 05/05/17 0556  NA 123*  < > 129* 133*  K 3.8  < > 3.4* 3.4*  CL 90*  < > 91* 96*  CO2 22  < > 28 26  GLUCOSE 191*  < > 100* 101*  BUN 7  < > <5* <5*  CREATININE 0.75  < > 0.68 0.62  CALCIUM 7.7*  < > 9.2 9.0  MG 1.4*  --   --   --   < > = values in this interval not displayed. Liver Function Tests: No results for input(s): AST, ALT, ALKPHOS, BILITOT, PROT, ALBUMIN in the last 168 hours. No results for input(s): LIPASE, AMYLASE in the last 168 hours. No results for input(s): AMMONIA in the last 168 hours. CBC:  Recent Labs Lab 05/04/17 0501 05/05/17 0556  WBC 7.2 5.7  HGB 12.0* 12.2*  HCT 34.9* 35.5*  MCV 96.7 97.3  PLT 252 203   Cardiac Enzymes:  Recent Labs Lab 05/03/17 1231 05/03/17 1823  TROPONINI 0.06* <0.03   BNP: Invalid input(s): POCBNP CBG: No results for input(s): GLUCAP in the last 168 hours.  Significant Diagnostic Studies:  Dg Chest 2 View  Result Date: 05/02/2017 CLINICAL DATA:  61 year old male with  syncope and chest tightness. EXAM: CHEST  2 VIEW COMPARISON:  Chest radiograph dated 08/16/2016 and chest CT dated 07/23/2015 FINDINGS: The lungs are clear. There is no pleural effusion or pneumothorax. The cardiac silhouette is within normal limits. Appy there is osteopenia with degenerative changes of the spine. Old midthoracic compression deformity similar to the CT of 07/23/2015 none no acute osseous pathology. IMPRESSION: No active cardiopulmonary disease. Electronically Signed   By: Anner Crete M.D.   On: 05/02/2017 23:43   Ct Angio Chest Pe W And/or Wo Contrast  Result Date: 05/03/2017 CLINICAL DATA:  Acute onset of shortness of breath. Initial encounter. EXAM: CT ANGIOGRAPHY CHEST WITH CONTRAST TECHNIQUE: Multidetector CT imaging of the chest was performed using the standard protocol during bolus administration of intravenous contrast. Multiplanar CT image reconstructions and MIPs were obtained to evaluate the vascular anatomy. There was partial extravasation of 40 mL of contrast into the patient's right antecubital fossa; the extravasation site was evaluated by the radiologist. An additional peripheral IV catheter could not be successfully placed. CONTRAST:  60 mL of Isovue 370 IV contrast COMPARISON:  Chest radiograph performed 05/02/2017, and CTA of the chest performed 07/23/2015 FINDINGS: Cardiovascular: There is no evidence of central pulmonary embolus. Evaluation for pulmonary embolus is suboptimal due to partial extravasation of the contrast bolus. The heart is normal in size. The thoracic aorta demonstrates minimal calcification. Mild calcification is seen along the proximal great vessels. Mediastinum/Nodes: There is a spiculated left upper lobe pulmonary nodule abutting the anterior aspect of the mediastinum, measuring approximately 2.2 x 2.1 x 1.4 cm and concerning for malignancy. This is new from 2017. The mediastinum is otherwise unremarkable. No mediastinal lymphadenopathy is seen. No  pericardial effusion is identified. The visualized portions of the thyroid gland are unremarkable. No axillary lymphadenopathy is seen. Lungs/Pleura: Scattered nodular opacities are seen throughout both lungs. There is suggestion of a miliary pattern, though some of the opacities are more diffuse. This may reflect atypical pneumonia. Fungal pneumonia or viral pneumonitis cannot be excluded. Tuberculosis is considered less likely. No pleural effusion or pneumothorax is seen. Upper Abdomen: The visualized portions of the liver and spleen are unremarkable. Musculoskeletal: No acute osseous abnormalities are identified. There is question of lytic lesions within vertebral bodies T1 and T2, apparently new from 2017. There is chronic loss of height at vertebral  bodies T8 and T9. The visualized musculature is unremarkable in appearance. Review of the MIP images confirms the above findings. IMPRESSION: 1. No evidence of central pulmonary embolus. 2. Scattered nodular opacities throughout both lungs. Suggestion of a miliary pattern, though some of the opacities are more diffuse. This is concerning for atypical pneumonia. Fungal pneumonia or viral pneumonitis cannot be excluded. Tuberculosis is considered less likely. Would correlate with the patient's symptoms. 3. Spiculated left upper lobe pulmonary nodule abutting the anterior aspect of the mediastinum, new from 2017. This measures 2.2 x 2.1 x 1.4 cm, and is concerning for primary bronchogenic malignancy. Tissue diagnosis and/or PET/CT would be helpful for further evaluation. 4. Question of lytic lesions within vertebral bodies T1 and T2, apparently new from 2017. This could be further assessed on bone scan or the PET/CT, as deemed clinically appropriate. Electronically Signed   By: Garald Balding M.D.   On: 05/03/2017 02:08   Mr Jeri Cos NK Contrast  Result Date: 05/03/2017 CLINICAL DATA:  Syncopal episode on way to bathroom. No head injury. Feeling unwell for 4 days.  History of LEFT lung mass, alcohol abuse, hypertension, hyperlipidemia. EXAM: MRI HEAD WITHOUT AND WITH CONTRAST TECHNIQUE: Multiplanar, multiecho pulse sequences of the brain and surrounding structures were obtained without and with intravenous contrast. CONTRAST:  39mL MULTIHANCE GADOBENATE DIMEGLUMINE 529 MG/ML IV SOLN COMPARISON:  CT HEAD August 16, 2016 FINDINGS: BRAIN: No reduced diffusion to suggest acute ischemia or hypercellular tumor. No susceptibility artifact to suggest hemorrhage. Patchy supratentorial white matter FLAIR T2 hyperintensities. Mild ex vacuo dilatation LEFT lateral ventricle, ventricles and sulci are overall mildly prominent for patient's age. LEFT midbrain prominent perivascular space, normal variant. No suspicious parenchymal signal, mass or mass effect. No abnormal intraparenchymal or extra-axial enhancement. No abnormal extra-axial fluid collections. VASCULAR: Normal major intracranial vascular flow voids present at skull base. SKULL AND UPPER CERVICAL SPINE: No abnormal sellar expansion. No suspicious calvarial bone marrow signal. Craniocervical junction maintained. SINUSES/ORBITS: The mastoid air-cells and included paranasal sinuses are well-aerated. The included ocular globes and orbital contents are non-suspicious. OTHER: None. IMPRESSION: 1. No acute intracranial process or metastasis. 2. Mild parenchymal brain volume loss for age. 3. Mild chronic small vessel ischemic disease. Electronically Signed   By: Elon Alas M.D.   On: 05/03/2017 23:56   Mr Thoracic Spine W Wo Contrast  Result Date: 05/04/2017 CLINICAL DATA:  Syncopal episode on way to bathroom. No head injury. Feeling unwell for 4 days. History of LEFT lung mass, alcohol abuse, hypertension, hyperlipidemia. EXAM: MRI THORACIC WITHOUT AND WITH CONTRAST TECHNIQUE: Multiplanar and multiecho pulse sequences of the thoracic spine were obtained without and with intravenous contrast. CONTRAST:  33mL MULTIHANCE  GADOBENATE DIMEGLUMINE 529 MG/ML IV SOLN COMPARISON:  CT chest May 03, 2017 at 0117 hours FINDINGS: Mildly motion degraded examination. ALIGNMENT: Maintenance of the thoracic kyphosis. No malalignment. VERTEBRAE/DISCS: Vertebral bodies are intact. Multiple low T1, I bright T2 osseous metastasis, predominately enhancing metastasis stoma which demonstrate central necrosis at T2, T4, T7, T9, T10, T12, L1 and L2. No pathologic fracture. Old mild T3, T4, moderate to severe T8 compression fracture. Disc desiccation all levels with multilevel mid to lower thoracic moderate chronic discogenic endplate changes. CORD: Thoracic spinal cord is normal morphology and signal characteristics to the level of the conus medullaris which terminates at T12-L1. Axial sequences are mildly motion degraded, multiple tiny foci T1 shortening most compatible with flow void without convincing evidence of metastasis. PREVERTEBRAL AND PARASPINAL SOFT TISSUES: Faint enhancement RIGHT T4-5  inner costal space of equivocal for metastasis (sagittal 1/17) DISC LEVELS: Multilevel small broad-based disc bulges without canal stenosis or neural foraminal narrowing at any level. IMPRESSION: 1. 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 2. Old mild T3, old mild T4 and moderate to severe old T8 compression fractures. 3. No significant canal stenosis or neural foraminal narrowing at any level. Electronically Signed   By: Elon Alas M.D.   On: 05/04/2017 00:10    2D ECHO: Study Conclusions  - Left ventricle: The cavity size was normal. Wall thickness was   normal. Systolic function was normal. The estimated ejection   fraction was in the range of 55% to 60%. Wall motion was normal;   there were no regional wall motion abnormalities. Doppler   parameters are consistent with abnormal left ventricular   relaxation (grade 1 diastolic dysfunction). - Aortic root: The aortic root was mildly  dilated.  Impressions:  - Normal LV systolic function; mild diastolic dysfunction; mildly   dilated aortic root.  Disposition and Follow-up: Discharge Instructions    Diet general    Complete by:  As directed    Increase activity slowly    Complete by:  As directed        DISPOSITION: home    DISCHARGE FOLLOW-UP Follow-up Information    Tanner Circle, [name]. Schedule an appointment as soon as possible for a visit in 2 week(s).   Specialties:  Family Medicine, Infectious Diseases Contact information: [address] [place] [address] [phone]        Melvenia Needles, NP Follow up on 05/15/2017.   Specialty:  Pulmonary Disease Why:  at 11:15AM (lung doctor appt) Contact information: [address] [place] [address] [phone]        [facility] - Dme Follow up.   Why:  cane  Contact information: [address] [place] [address] [phone]            Time spent on Discharge: 43mins   Signed:   Estill Cotta M.D. [facility] 05/05/2017, 11:47 AM Pager: [phone]

## 2017-05-05 NOTE — Care Management Note (Signed)
Case Management Note  Patient Details  Name: Colie Fugitt MRN: [id number] Date of Birth: Aug 02, 1955  Subjective/Objective:     Pt admitted with near syncopal episode               Action/Plan:  PTA independent from home with wife.  Pt has PCP and denied barriers to obtaining and paying for medications.  Therapy recommends DME cane.  Pt offered choice and pt chose [facility] - agency contacted and referral accepted - informed agency that pt will discharge today   Expected Discharge Date:  05/05/17               Expected Discharge Plan:  Home/Self Care (Independent from home with wife)  In-House Referral:     Discharge planning Services  CM Consult  Post Acute Care Choice:    Choice offered to:  Patient  DME Arranged:  Kasandra Knudsen DME Agency:  [place]:    [facility] Agency:     Status of Service:  Completed, signed off  If discussed at [place] of Stay Meetings, dates discussed:    Additional Comments:  Maryclare Labrador, RN 05/05/2017, 9:13 AM

## 2017-05-05 NOTE — Progress Notes (Signed)
Spoke with Dr. Tana Coast to review patients case.  She indicated he was discharging today.  Cultures reviewed > negative to date.  Concern for malignancy.  Patient has hospital follow up arranged with Pulmonary.  Will need outpatient PET imaging.  Will notify the office to see if we can get imaging done before 11/6 appointment.  PCCM will be available PRN.   Noe Gens, NP-C [facility] Pgr: [phone] or if no answer [phone] 05/05/2017, 11:06 AM

## 2017-05-05 NOTE — Evaluation (Signed)
Physical Therapy Evaluation Patient Details Name: Tanner [name] MRN: [id number] DOB: December 12, 1955 Today's Date: 05/05/2017   History of Present Illness  Tanner [name] is a 61 y.o. male with history of alcohol abuse, hypertension, hyperlipidemia presents to the ER the patient had an episode of loss of consciousness.  Patient states last evening at home when he was walking towards the bathroom he felt diaphoretic cold and clammy and passed out and his wife held him and did not hit the floor.  Patient c/o chest pressure, N/V, diarrhea.  Troponin negative.  MRI showed numerous mets in T-spine, and a LUL nodule/mass.  Clinical Impression  Patient functioning at Mod I to supervision level with mobility and gait.  Reaching for furniture during gait - recommend cane for home use/safety.  Contacted CM regarding cane.  Patient at baseline functional level, and has assist at home. No further PT needs identified - PT will sign off.    Follow Up Recommendations No PT follow up;Supervision for mobility/OOB    Equipment Recommendations  Cane    Recommendations for Other Services       Precautions / Restrictions Precautions Precautions: None Restrictions Weight Bearing Restrictions: No      Mobility  Bed Mobility Overal bed mobility: Modified Independent             General bed mobility comments: Increased time  Transfers Overall transfer level: Modified independent Equipment used: None                Ambulation/Gait Ambulation/Gait assistance: Supervision Ambulation Distance (Feet): 48 Feet Assistive device: None Gait Pattern/deviations: Step-through pattern;Decreased stride length;Drifts right/left Gait velocity: decreased Gait velocity interpretation: Below normal speed for age/gender General Gait Details: Patient with slow, guarded gait.  Reaching for furniture in room for support.    Stairs            Wheelchair Mobility    Modified Rankin (Stroke Patients Only)       Balance Overall balance assessment: Needs assistance Sitting-balance support: No upper extremity supported;Feet supported Sitting balance-Leahy Scale: Good     Standing balance support: No upper extremity supported Standing balance-Leahy Scale: Good                               Pertinent Vitals/Pain Pain Assessment: Faces Faces Pain Scale: Hurts even more Pain Location: back and Rt LE Pain Descriptors / Indicators: Aching;Constant;Sore Pain Intervention(s): Monitored during session;Repositioned    Home Living Family/patient expects to be discharged to:: Private residence Living Arrangements: Non-relatives/Friends (Ex-wife) Available Help at Discharge: Friend(s);Available 24 hours/day Type of Home: Mobile home Home Access: Stairs to enter Entrance Stairs-Rails: Right;Left;Can reach both Entrance Stairs-Number of Steps: 5 Home Layout: One level Home Equipment: Crutches      Prior Function Level of Independence: Independent         Comments: Ex-wife prepares meals and provides transportation - patient does not drive     Hand Dominance   Dominant Hand: Right    Extremity/Trunk Assessment   Upper Extremity Assessment Upper Extremity Assessment: LUE deficits/detail LUE Deficits / Details: Decreased shoulder ROM to approx 90*    Lower Extremity Assessment Lower Extremity Assessment: RLE deficits/detail RLE Deficits / Details: Patient reports foot numbness/tingling       Communication   Communication: No difficulties  Cognition Arousal/Alertness: Awake/alert Behavior During Therapy: WFL for tasks assessed/performed;Flat affect Overall Cognitive Status: Within Functional Limits for tasks assessed  General Comments      Exercises     Assessment/Plan    PT Assessment Patent does not need any further PT services  PT Problem List         PT Treatment Interventions      PT Goals  (Current goals can be found in the Care Plan section)  Acute Rehab PT Goals Patient Stated Goal: To go home PT Goal Formulation: All assessment and education complete, DC therapy    Frequency     Barriers to discharge        Co-evaluation               AM-PAC PT "6 Clicks" Daily Activity  Outcome Measure Difficulty turning over in bed (including adjusting bedclothes, sheets and blankets)?: None Difficulty moving from lying on back to sitting on the side of the bed? : None Difficulty sitting down on and standing up from a chair with arms (e.g., wheelchair, bedside commode, etc,.)?: None Help needed moving to and from a bed to chair (including a wheelchair)?: None Help needed walking in hospital room?: A Little Help needed climbing 3-5 steps with a railing? : A Little 6 Click Score: 22    End of Session Equipment Utilized During Treatment: Gait belt Activity Tolerance: Patient tolerated treatment well Patient left: in bed;with call bell/phone within reach;with family/visitor present Nurse Communication: Mobility status (Recommend cane) PT Visit Diagnosis: Other abnormalities of gait and mobility (R26.89);Pain Pain - Right/Left: Right Pain - part of body: Leg (back)    Time: [id number] PT Time Calculation (min) (ACUTE ONLY): 12 min   Charges:   PT Evaluation $PT Eval Low Complexity: 1 Low     PT G Codes:        Tanner [name]. Tanner [name], Tanner [name] Acute Rehab Services Pager Tanner [name] 05/05/2017, 9:22 AM

## 2017-05-05 NOTE — Care Management Important Message (Signed)
Important Message  Patient Details  Name: Tanner [name] MRN: [id number] Date of Birth: 1955/10/18   Medicare Important Message Given:  Yes    Maryclare Labrador, RN 05/05/2017, 10:37 AM

## 2017-05-06 LAB — CULTURE, RESPIRATORY W GRAM STAIN: Culture: NORMAL

## 2017-05-06 LAB — CULTURE, RESPIRATORY

## 2017-05-07 ENCOUNTER — Telehealth: Payer: Self-pay | Admitting: Family

## 2017-05-07 DIAGNOSIS — I10 Essential (primary) hypertension: Secondary | ICD-10-CM

## 2017-05-07 MED ORDER — METOPROLOL TARTRATE 100 MG PO TABS: 100.0000 mg | ORAL_TABLET | Freq: Two times a day (BID) | ORAL | 0 refills | Status: DC

## 2017-05-07 NOTE — Telephone Encounter (Signed)
Pt called stating that he is needing a refill on metoprolol tartrate (LOPRESSOR) 100 MG tablet. He said that the pharmacy has been requesting the refill but has not received it.He took his last dose this morning and will need more for tonight's dose and in the morning before his appointment. Please advise. Pt uses [facility] on [address].

## 2017-05-07 NOTE — Addendum Note (Signed)
Addended by: Desmond Dike C on: 05/07/2017 09:41 AM   Modules accepted: Orders

## 2017-05-07 NOTE — Telephone Encounter (Signed)
Ria Comment,   Thank you for taking care of this for Mr. Bart!  The associated diagnosis would be lung mass (spiculated lung nodule) or lung nodule.  If these do not work, please let me know.  Thank you!!  Call me if you need assistance.   Noe Gens, NP-C [facility] Pgr: [phone] or if no answer [phone] 05/07/2017, 9:34 AM

## 2017-05-07 NOTE — Telephone Encounter (Signed)
Tanner [name]! What diagnosis do you want to associate with this order? Thanks!

## 2017-05-07 NOTE — Telephone Encounter (Signed)
Order has been placed per Brandi.

## 2017-05-07 NOTE — Telephone Encounter (Signed)
Rx sent. Pt aware.  

## 2017-05-08 ENCOUNTER — Encounter: Payer: Self-pay | Admitting: Nurse Practitioner

## 2017-05-08 ENCOUNTER — Other Ambulatory Visit (INDEPENDENT_AMBULATORY_CARE_PROVIDER_SITE_OTHER): Payer: Medicare HMO

## 2017-05-08 ENCOUNTER — Ambulatory Visit (INDEPENDENT_AMBULATORY_CARE_PROVIDER_SITE_OTHER): Payer: Medicare HMO | Admitting: Nurse Practitioner

## 2017-05-08 VITALS — BP 104/74 | HR 71 | Temp 98.1°F | Resp 16 | Ht 71.0 in | Wt 162.0 lb

## 2017-05-08 DIAGNOSIS — R197 Diarrhea, unspecified: Secondary | ICD-10-CM

## 2017-05-08 DIAGNOSIS — Z23 Encounter for immunization: Secondary | ICD-10-CM | POA: Diagnosis not present

## 2017-05-08 DIAGNOSIS — R918 Other nonspecific abnormal finding of lung field: Secondary | ICD-10-CM | POA: Diagnosis not present

## 2017-05-08 DIAGNOSIS — M545 Low back pain, unspecified: Secondary | ICD-10-CM

## 2017-05-08 DIAGNOSIS — E871 Hypo-osmolality and hyponatremia: Secondary | ICD-10-CM | POA: Diagnosis not present

## 2017-05-08 LAB — CBC
HEMATOCRIT: 40.6 % (ref 39.0–52.0)
Hemoglobin: 13.6 g/dL (ref 13.0–17.0)
MCHC: 33.5 g/dL (ref 30.0–36.0)
MCV: 101.3 fl — ABNORMAL HIGH (ref 78.0–100.0)
Platelets: 334 10*3/uL (ref 150.0–400.0)
RBC: 4.01 Mil/uL — ABNORMAL LOW (ref 4.22–5.81)
RDW: 13.2 % (ref 11.5–15.5)
WBC: 9.1 10*3/uL (ref 4.0–10.5)

## 2017-05-08 LAB — CULTURE, BLOOD (ROUTINE X 2)
CULTURE: NO GROWTH
Culture: NO GROWTH
SPECIAL REQUESTS: ADEQUATE
Special Requests: ADEQUATE

## 2017-05-08 LAB — BASIC METABOLIC PANEL
BUN: 16 mg/dL (ref 6–23)
CHLORIDE: 98 meq/L (ref 96–112)
CO2: 25 mEq/L (ref 19–32)
CREATININE: 1.24 mg/dL (ref 0.40–1.50)
Calcium: 10 mg/dL (ref 8.4–10.5)
GFR: 63.44 mL/min (ref >60.00)
Glucose, Bld: 96 mg/dL (ref 70–99)
Potassium: 5.2 mEq/L — ABNORMAL HIGH (ref 3.5–5.1)
Sodium: 132 mEq/L — ABNORMAL LOW (ref 135–145)

## 2017-05-08 LAB — ANCA TITERS
Atypical P-ANCA titer: <1:20 {titer}
C-ANCA: <1:20 {titer}
P-ANCA: <1:20 {titer}

## 2017-05-08 NOTE — Addendum Note (Signed)
Addended by: Delice Bison E on: 05/08/2017 01:27 PM   Modules accepted: Orders

## 2017-05-08 NOTE — Patient Instructions (Addendum)
Please head downstairs for lab work.  I'd like to see you back in 1 month to see how you are doing.  It was nice to meet you. Thanks for letting me take care of you today :)

## 2017-05-08 NOTE — Progress Notes (Signed)
Subjective:    Patient ID: Tanner [name], male    DOB: 01-10-1956, 61 y.o.   MRN: [id number]  HPI Tanner [name] is a 61 yo male who presents today to establish care. He is here for a hospital follow up. He is transferring to me from another provider in the same clinic.  He was admitted to [facility] on 10/24 for syncope. He'd been experiencing some nausea, vomiting, and diarrhea prior to the syncopal episode. Diagnostic testing and admission notes reviewed. Syncopal episode was likely related to dehydration and hyponatremia. His sodium was 120 on admission and had improved to 133 at discharge. He had a 2D echo with EF 58-85%, grade 1 diastolic dysfunction; negative serial troponins. He did have a CT scan which was negative for PE, questionable for atypical pneumonia and showed abnormal lesions and lytic lesions to the spine. MRI and CT abdomen were negative for metastasis, acute abnormalities; Tanner t-spine with metastasis. He was treated with zithromax, rocephin, and vantin in the hospital. He was discharged home on 10/27 with a plan to follow up with pulmonology for a PET scan this coming Friday and PCP for repeat labs, heart healthy diet, and to/ slowly increase activity back to normal. He quit smoking cigarettes on august 30 and does not plan to resume.  Since hes been home, hes noticed some pain in his lower back. The pain is an aching stiffness that is worse in the morning when he first wakes and again at the end of the day. The pain does not radiate. The pain decreases when he gets up and moves around. Hes been smoking marijuana for the pain which he says provides some relief.  Hes continued to have diarrhea daily since hes been home. He reports about 2-3 loose stools a day. Hes not had any continued nausea or vomiting. He denies fevers, abdominal pain. He has not tried anything for the diarrhea at home.  Review of Systems See HPI  Past Medical History:  Diagnosis Date  . Alcohol abuse     . Alcoholic hepatitis   . Allergy   . Anxiety   . 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 C. difficile colitis   . Depression   . DVT (deep venous thrombosis) ([place]) 2015  . GERD (gastroesophageal reflux disease)   . Hypertension   . Ileus ([place]) 09/2013  . 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 Shortness of breath    new onset- occasionally-at rest and exertion  . Substance abuse ([place])   . TIA (transient ischemic attack) 08/2013     Social History   Social History  . Marital status: Divorced    Spouse name: N/A  . Number of children: 1  . Years of education: 58   Occupational History  . disabled     Fingers, scolisosis,    Social History Main Topics  . Smoking status: Former Smoker    Packs/day: 1.50    Years: 30.00    Types: Cigarettes    Quit date: 03/09/2017  . Smokeless tobacco: Never Used  . Alcohol use 4.8 - 6.0 oz/week    8 - 10 Cans of beer per week  . Drug use: Yes    Types: Marijuana  . Sexual activity: Not on file   Other Topics Concern  . Not on file   Social History Narrative   Denies    Past Surgical History:  Procedure Laterality Date  . HAND SURGERY  Right   . HERNIA REPAIR    . INTRAMEDULLARY (IM) NAIL INTERTROCHANTERIC Right 08/17/2016   Procedure: INTRAMEDULLARY (IM) NAIL RIGHT HIP;  Surgeon: Tania Ade, MD;  Location: [place] [facility];  Service: Orthopedics;  Laterality: Right;  . LYMPHADENECTOMY Bilateral 08/29/2013   Procedure: LYMPHADENECTOMY "BILATERAL PELVIC LYMPH NODE DISSECTION";  Surgeon: Molli Hazard, MD;  Location: [facility];  Service: Urology;  Laterality: Bilateral;  . ORIF ANKLE FRACTURE Right 12/10/2012   Procedure: OPEN REDUCTION INTERNAL FIXATION (ORIF) ANKLE FRACTURE;  Surgeon: Hessie Dibble, MD;  Location: [facility];  Service: Orthopedics;  Laterality: Right;  . ROBOT ASSISTED LAPAROSCOPIC RADICAL PROSTATECTOMY N/A 08/29/2013   Procedure: ROBOTIC ASSISTED LAPAROSCOPIC RADICAL PROSTATECTOMY LEVEL 2, Lysis of adhesions;   Surgeon: Molli Hazard, MD;  Location: [facility];  Service: Urology;  Laterality: N/A;    Family History  Problem Relation Age of Onset  . Lung cancer Father   . Arthritis Mother   . Pancreatic cancer Sister     Allergies  Allergen Reactions  . Dilaudid [Hydromorphone Hcl] Nausea And Vomiting    Pre pt history  . Oxycodone Nausea And Vomiting  . Procaine Hcl Other (See Comments)    Patient states he is allergic to novicaine  . Zofran [Ondansetron] Nausea And Vomiting  . Sulfa Antibiotics Rash    Current Outpatient Prescriptions on File Prior to Visit  Medication Sig Dispense Refill  . amLODipine (NORVASC) 10 MG tablet Take 1 tablet (10 mg total) by mouth daily. 90 tablet 1  . azithromycin (ZITHROMAX) 500 MG tablet Take 1 tablet (500 mg total) by mouth daily. X 7 days 7 tablet 0  . cefpodoxime (VANTIN) 200 MG tablet Take 1 tablet (200 mg total) by mouth 2 (two) times daily. X 7 days 14 tablet 0  . diazepam (VALIUM) 5 MG tablet Take 1 tablet (5 mg total) by mouth 2 (two) times daily as needed for anxiety or muscle spasms. 30 tablet 0  . FLUoxetine (PROZAC) 40 MG capsule TAKE 1 CAPSULE BY MOUTH EVERY DAY 30 capsule 3  . lisinopril (PRINIVIL,ZESTRIL) 40 MG tablet TAKE ONE (1) TABLET BY MOUTH EVERY DAY 90 tablet 1  . loperamide (IMODIUM) 2 MG capsule Take 2 capsules (4 mg total) by mouth every 8 (eight) hours as needed for diarrhea or loose stools. Over the counter 30 capsule 0  . metoprolol tartrate (LOPRESSOR) 100 MG tablet Take 1 tablet (100 mg total) by mouth 2 (two) times daily. 60 tablet 0  . omeprazole (PRILOSEC) 40 MG capsule TAKE 1 CAPSULE BY MOUTH TWICE A DAY 120 capsule 0  . simvastatin (ZOCOR) 20 MG tablet TAKE ONE (1) TABLET BY MOUTH EVERY DAY 90 tablet 1  . thiamine 100 MG tablet Take 1 tablet (100 mg total) by mouth daily. 30 tablet 3  . traZODone (DESYREL) 50 MG tablet TAKE 1 TABLET BY MOUTH AT BEDTIME AS NEEDED FOR SLEEP 30 tablet 0   No current  facility-administered medications on file prior to visit.     BP 104/74 (BP Location: Left Arm, Patient Position: Sitting, Cuff Size: Normal)   Pulse 71   Temp 98.1 F (36.7 C) (Oral)   Resp 16   Ht 5\' 11"  (1.803 m)   Wt 162 lb (73.5 kg)   SpO2 93%   BMI 22.59 kg/m      Objective:   Physical Exam  Constitutional: He appears well-developed and well-nourished.  Cardiovascular: Normal rate, regular rhythm, normal heart sounds and intact distal pulses.  Pulmonary/Chest: Effort normal and breath sounds normal.  Abdominal: Soft. Bowel sounds are normal. He exhibits no distension. There is no tenderness. There is no rebound and no guarding.  Neurological:  Weak gait.      Assessment & Plan:  Flu shot given today  Diarrhea-C. diff in hospital negative. Continued diarrhea. Gi pathogen panel, fecal lactoferrin ordered.  Back pain- could be related to metastasis, old injuries. He denies radiation. He does not want to try any medication for the pain at home. He will try heat. hes been smoking marijuana for the pain, I advised him to avoid smoke inhalation due to lung lesions.  CBC, BMET ordered for hospital follow up.  He will continue heart healthy diet at home and return in 1 month for follow up of chronic conditions.

## 2017-05-09 ENCOUNTER — Other Ambulatory Visit: Payer: Self-pay | Admitting: Nurse Practitioner

## 2017-05-09 DIAGNOSIS — E876 Hypokalemia: Secondary | ICD-10-CM

## 2017-05-09 DIAGNOSIS — R197 Diarrhea, unspecified: Secondary | ICD-10-CM

## 2017-05-09 DIAGNOSIS — E871 Hypo-osmolality and hyponatremia: Secondary | ICD-10-CM

## 2017-05-10 ENCOUNTER — Encounter: Payer: Self-pay | Admitting: Physician Assistant

## 2017-05-11 ENCOUNTER — Ambulatory Visit (HOSPITAL_COMMUNITY)
Admission: RE | Admit: 2017-05-11 | Discharge: 2017-05-11 | Disposition: A | Discharge status: Home / Self Care | Payer: Medicare HMO | Source: Ambulatory Visit | Attending: Pulmonary Disease | Admitting: Pulmonary Disease

## 2017-05-11 ENCOUNTER — Telehealth: Payer: Self-pay

## 2017-05-11 DIAGNOSIS — M1712 Unilateral primary osteoarthritis, left knee: Secondary | ICD-10-CM | POA: Diagnosis not present

## 2017-05-11 DIAGNOSIS — C7951 Secondary malignant neoplasm of bone: Secondary | ICD-10-CM | POA: Diagnosis not present

## 2017-05-11 DIAGNOSIS — M1711 Unilateral primary osteoarthritis, right knee: Secondary | ICD-10-CM | POA: Diagnosis not present

## 2017-05-11 DIAGNOSIS — E46 Unspecified protein-calorie malnutrition: Secondary | ICD-10-CM | POA: Insufficient documentation

## 2017-05-11 DIAGNOSIS — R911 Solitary pulmonary nodule: Secondary | ICD-10-CM | POA: Diagnosis present

## 2017-05-11 DIAGNOSIS — M2352 Chronic instability of knee, left knee: Secondary | ICD-10-CM | POA: Diagnosis not present

## 2017-05-11 DIAGNOSIS — M2351 Chronic instability of knee, right knee: Secondary | ICD-10-CM | POA: Diagnosis not present

## 2017-05-11 DIAGNOSIS — R918 Other nonspecific abnormal finding of lung field: Secondary | ICD-10-CM | POA: Diagnosis not present

## 2017-05-11 DIAGNOSIS — M19012 Primary osteoarthritis, left shoulder: Secondary | ICD-10-CM | POA: Diagnosis not present

## 2017-05-11 DIAGNOSIS — I10 Essential (primary) hypertension: Secondary | ICD-10-CM

## 2017-05-11 DIAGNOSIS — M47816 Spondylosis without myelopathy or radiculopathy, lumbar region: Secondary | ICD-10-CM | POA: Diagnosis not present

## 2017-05-11 LAB — GLUCOSE, CAPILLARY: GLUCOSE-CAPILLARY: 95 mg/dL (ref 65–99)

## 2017-05-11 MED ORDER — TRAZODONE HCL 50 MG PO TABS: 50.0000 mg | ORAL_TABLET | Freq: Every evening | ORAL | 0 refills | Status: DC | PRN

## 2017-05-11 MED ORDER — FLUDEOXYGLUCOSE F - 18 (FDG) INJECTION
8.1000 | Freq: Once | INTRAVENOUS | Status: AC | PRN
  Administered 2017-05-11: 8.1 via INTRAVENOUS

## 2017-05-11 MED ORDER — METOPROLOL TARTRATE 100 MG PO TABS: 100.0000 mg | ORAL_TABLET | Freq: Two times a day (BID) | ORAL | 1 refills | Status: DC

## 2017-05-11 NOTE — Telephone Encounter (Signed)
Received fax from pharmacy to refill trazodone. Please advise.

## 2017-05-11 NOTE — Telephone Encounter (Signed)
Okay to refill? 

## 2017-05-11 NOTE — Telephone Encounter (Signed)
Rx sent 

## 2017-05-15 ENCOUNTER — Encounter: Payer: Self-pay | Admitting: Adult Health

## 2017-05-15 ENCOUNTER — Other Ambulatory Visit: Payer: Self-pay | Admitting: Nurse Practitioner

## 2017-05-15 ENCOUNTER — Other Ambulatory Visit: Payer: Medicare HMO

## 2017-05-15 ENCOUNTER — Ambulatory Visit (INDEPENDENT_AMBULATORY_CARE_PROVIDER_SITE_OTHER): Payer: Medicare HMO | Admitting: Adult Health

## 2017-05-15 VITALS — BP 130/70 | HR 67 | Ht 70.0 in | Wt 165.1 lb

## 2017-05-15 DIAGNOSIS — R918 Other nonspecific abnormal finding of lung field: Secondary | ICD-10-CM

## 2017-05-15 DIAGNOSIS — R197 Diarrhea, unspecified: Secondary | ICD-10-CM

## 2017-05-15 DIAGNOSIS — J441 Chronic obstructive pulmonary disease with (acute) exacerbation: Secondary | ICD-10-CM

## 2017-05-15 DIAGNOSIS — R0602 Shortness of breath: Secondary | ICD-10-CM | POA: Diagnosis not present

## 2017-05-15 MED ORDER — GLYCOPYRROLATE-FORMOTEROL 9-4.8 MCG/ACT IN AERO: 2.0000 | INHALATION_SPRAY | Freq: Two times a day (BID) | RESPIRATORY_TRACT | 3 refills | Status: DC

## 2017-05-15 NOTE — Assessment & Plan Note (Addendum)
2.2 cm LUL lung mass -hypermetabolic on PET with suspected osseous mets.  Pt  with heavy smoking history .  This appears to be a underlying lung cancer,  Reviewed CT and PET scan with Dr. Ashok Cordia regarding biopsy options for tissue diagnosis . It does not appear that the lesion is reachable by FOB, EBUS/ENB. There is no significant adenopathy for biopsy except a small left prevascular node. Possible lytic bx but question if would be able to get dx from this .  Will refer to [facility] for evlaution from team to see best approach recommendations.   Will set up with Dr. Lamonte Sakai  - as Dr. Ashok Cordia is leaving early Dec .

## 2017-05-15 NOTE — Patient Instructions (Signed)
Refer to [facility] clinic .  Refer to Dr. Lamonte Sakai  In 3-4 weeks with PFT .  Please contact office for sooner follow up if symptoms do not improve or worsen or seek emergency care

## 2017-05-15 NOTE — Progress Notes (Signed)
Note reviewed & case discussed with NP.  Tanner [name]. Ashok Cordia, M.D. [facility] Pager:  [phone] After 7pm or if no response, call [phone] 4:01 PM 05/15/17

## 2017-05-15 NOTE — Assessment & Plan Note (Signed)
Recent exacerbation now improving.  Patient is encouraged on smoking cessation. Hold on inhalers at this time.  Will check PFTs on return.

## 2017-05-15 NOTE — Progress Notes (Signed)
@Patient  ID: Tanner [name], male    DOB: 07/22/1955, 61 y.o.   MRN: [id number]  Chief Complaint  Patient presents with  . Follow-up    Lung mass     Referring provider: [name], [name]  HPI: 61 yo male former smoker (quit 02/2017) seen for pulmonary consult during hospitalization in 04/2017 for lung mass  Hx of prostate Cancer s/p prostatectomy  ~2013     TEST  CTA CHEST 05/03/17  IMPRESSION: 1. No evidence of central pulmonary embolus. 2. Scattered nodular opacities throughout both lungs. Suggestion of a miliary pattern, though some of the opacities are more diffuse. This is concerning for atypical pneumonia. Fungal pneumonia or viral pneumonitis cannot be excluded. Tuberculosis is considered less likely. Would correlate with the patient's symptoms. 3. Spiculated left upper lobe pulmonary nodule abutting the anterior aspect of the mediastinum, new from 2017. This measures 2.2 x 2.1 x 1.4 cm, and is concerning for primary bronchogenic malignancy. Tissue diagnosis and/or PET/CT would be helpful for further evaluation. 4. Question of lytic lesions within vertebral bodies T1 and T2, apparently new from 2017. This could be further assessed on bone scan or the PET/CT, as deemed clinically appropriate.   MRI THORACIC SPINE W/ & W/O CONTRAST 10/25:  Numerous thoracic osseous metastasis without pathologic fracture. Old mild T3, old mild T4 and moderate to severe old T8 compression fractures. No significant canal stenosis or neural foraminal narrowing at any level.   CT ABDOMEN/PELVIS W/ CONTRAST 10/26:  Suspect mild fatty infiltration of the liver. Small bilateral renal cysts. Aortoiliac atherosclerosis. Sigmoid diverticulosis. No acute findings.  PET scan 06/16/77 > hypermetabolic LUL lung mass, Diffuse lytic osseous metastatic dz. No findings of pulmonary metastatic dz or abd/pelvic dz. .metabolically active left prevascular lymph node.-6.5 mm and SUV max is 4.22.  PFT 04/2016  >FEV1 60%, ratio 62, FVC 74% , ++BD response   PSA 04/2017 <0    05/15/2017 Follow up ; Hospital follow up /Lung mass  Patient returns for a post hospital follow-up.  Patient recently quit smoking.  And has a history of alcohol abuse.  Patient was admitted for a near syncopal episode.  Patient says he had 4 days of chills nausea and chest tightness prior to admission.  On on admission he was found to be dehydrated hyponatremic with a sodium level at 120.  Cardiac workup showed a negative troponin.  Echo showed an EF of 55-60% with grade 1 diastolic dysfunction.  Patient was treated with supportive care with IV fluids.  Sodium improved to 133 at discharge.. CT chest was negative for a PE it did show a spiculated left upper lobe pulmonary nodule.  That was new from 2017 measuring 2.2 cm.  There was suspected lytic lesions in the vertebral bodies T1 and T2.  Patient was treated with IV antibiotics.   patient was seen by pulmonary.  Autoimmune workup was on revealing.  With a negative ANA and Anca.  Patient was set up for a PET scan that showed hypermetabolic left upper lobe lung mass, diffuse lytic osseous metastatic disease.  Metabolically active left prevascular lymph node measuring 6.5 mm.  I reviewed these results with patient and his wife.  Patient says he has had no weight loss.  No hemoptysis.  Since discharge patient is feeling better.  Patient has quit smoking over the last 2 months.  He does drink on a daily basis around 8-10 beers each night.  Previous history of heavier drinking.  He does smoke marijuana.  We discussed cessation and decrease in alcohol intake.    Patient has previous pulmonary function testing done in October 2017 that showed moderate COPD with an FEV1 at 60% and a ratio of 62.  FVC was 74% positive bronchodilator response.  Patient says he has not on any inhalers.  He does have occasional shortness of breath but since discharge this has been improved.  He denies any increased cough  or wheezing.       Allergies  Allergen Reactions  . Dilaudid [Hydromorphone Hcl] Nausea And Vomiting    Pre pt history  . Oxycodone Nausea And Vomiting  . Procaine Hcl Other (See Comments)    Patient states he is allergic to novicaine  . Zofran [Ondansetron] Nausea And Vomiting  . Sulfa Antibiotics Rash    Immunization History  Administered Date(s) Administered  . Influenza,inj,Quad PF,6+ Mos 05/08/2017    Past Medical History:  Diagnosis Date  . Alcohol abuse   . Alcoholic hepatitis   . Allergy   . Anxiety   . 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 C. difficile colitis   . Depression   . DVT (deep venous thrombosis) ([place]) 2015  . GERD (gastroesophageal reflux disease)   . Hypertension   . Ileus ([place]) 09/2013  . 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 Shortness of breath    new onset- occasionally-at rest and exertion  . Substance abuse ([place])   . TIA (transient ischemic attack) 08/2013    Tobacco History: Social History   Tobacco Use  Smoking Status Former Smoker  . Packs/day: 1.50  . Years: 30.00  . Pack years: 45.00  . Types: Cigarettes  . Last attempt to quit: 03/09/2017  . Years since quitting: 0.1  Smokeless Tobacco Never Used   Counseling given: Not Answered   Outpatient Encounter Medications as of 05/15/2017  Medication Sig  . amLODipine (NORVASC) 10 MG tablet Take 1 tablet (10 mg total) by mouth daily.  . diazepam (VALIUM) 5 MG tablet Take 1 tablet (5 mg total) by mouth 2 (two) times daily as needed for anxiety or muscle spasms.  Marland Kitchen FLUoxetine (PROZAC) 40 MG capsule TAKE 1 CAPSULE BY MOUTH EVERY DAY  . lisinopril (PRINIVIL,ZESTRIL) 40 MG tablet TAKE ONE (1) TABLET BY MOUTH EVERY DAY  . loperamide (IMODIUM) 2 MG capsule Take 2 capsules (4 mg total) by mouth every 8 (eight) hours as needed for diarrhea or loose stools. Over the counter  . metoprolol tartrate (LOPRESSOR) 100 MG tablet Take 1 tablet (100 mg total) by mouth 2 (two) times daily.  Marland Kitchen  omeprazole (PRILOSEC) 40 MG capsule TAKE 1 CAPSULE BY MOUTH TWICE A DAY  . simvastatin (ZOCOR) 20 MG tablet TAKE ONE (1) TABLET BY MOUTH EVERY DAY  . thiamine 100 MG tablet Take 1 tablet (100 mg total) by mouth daily.  . traZODone (DESYREL) 50 MG tablet Take 1 tablet (50 mg total) by mouth at bedtime as needed. for sleep  . Glycopyrrolate-Formoterol (BEVESPI AEROSPHERE) 9-4.8 MCG/ACT AERO Inhale 2 puffs 2 (two) times daily into the lungs.  . [DISCONTINUED] azithromycin (ZITHROMAX) 500 MG tablet Take 1 tablet (500 mg total) by mouth daily. X 7 days  . [DISCONTINUED] cefpodoxime (VANTIN) 200 MG tablet Take 1 tablet (200 mg total) by mouth 2 (two) times daily. X 7 days   No facility-administered encounter medications on file as of 05/15/2017.      Review of Systems  Constitutional:   No  weight  loss, night sweats,  Fevers, chills, + fatigue, or  lassitude.  HEENT:   No headaches,  Difficulty swallowing,  Tooth/dental problems, or  Sore throat,                No sneezing, itching, ear ache, nasal congestion, post nasal drip,   CV:  No chest pain,  Orthopnea, PND, swelling in lower extremities, anasarca, dizziness, palpitations, syncope.   GI  No heartburn, indigestion, abdominal pain, nausea, vomiting, diarrhea, change in bowel habits, loss of appetite, bloody stools.   Resp No chest wall deformity  Skin: no rash or lesions.  GU: no dysuria, change in color of urine, no urgency or frequency.  No flank pain, no hematuria   MS:  No joint pain or swelling.  No decreased range of motion.  No back pain.    Physical Exam  BP 130/70 (BP Location: Left Arm, Cuff Size: Normal)   Pulse 67   Ht 5\' 10"  (1.778 m)   Wt 165 lb 2 oz (74.9 kg)   SpO2 94%   BMI 23.69 kg/m    GEN: A/Ox3; pleasant , NAD, thin    HEENT:  [place]/AT,  EACs-clear, TMs-wnl, NOSE-clear, THROAT-clear, no lesions, no postnasal drip or exudate noted.   NECK:  Supple w/ fair ROM; no JVD; normal carotid impulses w/o bruits;  no thyromegaly or nodules palpated; no lymphadenopathy.    RESP  Clear  P & A; w/o, wheezes/ rales/ or rhonchi. no accessory muscle use, no dullness to percussion  CARD:  RRR, no m/r/g, no peripheral edema, pulses intact, no cyanosis or clubbing.  GI:   Soft & nt; nml bowel sounds; no organomegaly or masses detected.   Musco: Warm bil, no deformities or joint swelling noted.   Neuro: alert, no focal deficits noted.    Skin: Warm, no lesions or rashes    Lab Results:  CBC  ProBNP No results found for: PROBNP  Imaging: Dg Chest 2 View  Result Date: 05/02/2017 CLINICAL DATA:  61 year old male with syncope and chest tightness. EXAM: CHEST  2 VIEW COMPARISON:  Chest radiograph dated 08/16/2016 and chest CT dated 07/23/2015 FINDINGS: The lungs are clear. There is no pleural effusion or pneumothorax. The cardiac silhouette is within normal limits. Appy there is osteopenia with degenerative changes of the spine. Old midthoracic compression deformity similar to the CT of 07/23/2015 none no acute osseous pathology. IMPRESSION: No active cardiopulmonary disease. Electronically Signed   By: Anner Crete M.D.   On: 05/02/2017 23:43   Ct Angio Chest Pe W And/or Wo Contrast  Result Date: 05/03/2017 CLINICAL DATA:  Acute onset of shortness of breath. Initial encounter. EXAM: CT ANGIOGRAPHY CHEST WITH CONTRAST TECHNIQUE: Multidetector CT imaging of the chest was performed using the standard protocol during bolus administration of intravenous contrast. Multiplanar CT image reconstructions and MIPs were obtained to evaluate the vascular anatomy. There was partial extravasation of 40 mL of contrast into the patient's right antecubital fossa; the extravasation site was evaluated by the radiologist. An additional peripheral IV catheter could not be successfully placed. CONTRAST:  60 mL of Isovue 370 IV contrast COMPARISON:  Chest radiograph performed 05/02/2017, and CTA of the chest performed  07/23/2015 FINDINGS: Cardiovascular: There is no evidence of central pulmonary embolus. Evaluation for pulmonary embolus is suboptimal due to partial extravasation of the contrast bolus. The heart is normal in size. The thoracic aorta demonstrates minimal calcification. Mild calcification is seen along the proximal great vessels. Mediastinum/Nodes: There is a spiculated  left upper lobe pulmonary nodule abutting the anterior aspect of the mediastinum, measuring approximately 2.2 x 2.1 x 1.4 cm and concerning for malignancy. This is new from 2017. The mediastinum is otherwise unremarkable. No mediastinal lymphadenopathy is seen. No pericardial effusion is identified. The visualized portions of the thyroid gland are unremarkable. No axillary lymphadenopathy is seen. Lungs/Pleura: Scattered nodular opacities are seen throughout both lungs. There is suggestion of a miliary pattern, though some of the opacities are more diffuse. This may reflect atypical pneumonia. Fungal pneumonia or viral pneumonitis cannot be excluded. Tuberculosis is considered less likely. No pleural effusion or pneumothorax is seen. Upper Abdomen: The visualized portions of the liver and spleen are unremarkable. Musculoskeletal: No acute osseous abnormalities are identified. There is question of lytic lesions within vertebral bodies T1 and T2, apparently new from 2017. There is chronic loss of height at vertebral bodies T8 and T9. The visualized musculature is unremarkable in appearance. Review of the MIP images confirms the above findings. IMPRESSION: 1. No evidence of central pulmonary embolus. 2. Scattered nodular opacities throughout both lungs. Suggestion of a miliary pattern, though some of the opacities are more diffuse. This is concerning for atypical pneumonia. Fungal pneumonia or viral pneumonitis cannot be excluded. Tuberculosis is considered less likely. Would correlate with the patient's symptoms. 3. Spiculated left upper lobe pulmonary  nodule abutting the anterior aspect of the mediastinum, new from 2017. This measures 2.2 x 2.1 x 1.4 cm, and is concerning for primary bronchogenic malignancy. Tissue diagnosis and/or PET/CT would be helpful for further evaluation. 4. Question of lytic lesions within vertebral bodies T1 and T2, apparently new from 2017. This could be further assessed on bone scan or the PET/CT, as deemed clinically appropriate. Electronically Signed   By: Garald Balding M.D.   On: 05/03/2017 02:08   Mr Jeri Cos GN Contrast  Result Date: 05/03/2017 CLINICAL DATA:  Syncopal episode on way to bathroom. No head injury. Feeling unwell for 4 days. History of LEFT lung mass, alcohol abuse, hypertension, hyperlipidemia. EXAM: MRI HEAD WITHOUT AND WITH CONTRAST TECHNIQUE: Multiplanar, multiecho pulse sequences of the brain and surrounding structures were obtained without and with intravenous contrast. CONTRAST:  31mL MULTIHANCE GADOBENATE DIMEGLUMINE 529 MG/ML IV SOLN COMPARISON:  CT HEAD August 16, 2016 FINDINGS: BRAIN: No reduced diffusion to suggest acute ischemia or hypercellular tumor. No susceptibility artifact to suggest hemorrhage. Patchy supratentorial white matter FLAIR T2 hyperintensities. Mild ex vacuo dilatation LEFT lateral ventricle, ventricles and sulci are overall mildly prominent for patient's age. LEFT midbrain prominent perivascular space, normal variant. No suspicious parenchymal signal, mass or mass effect. No abnormal intraparenchymal or extra-axial enhancement. No abnormal extra-axial fluid collections. VASCULAR: Normal major intracranial vascular flow voids present at skull base. SKULL AND UPPER CERVICAL SPINE: No abnormal sellar expansion. No suspicious calvarial bone marrow signal. Craniocervical junction maintained. SINUSES/ORBITS: The mastoid air-cells and included paranasal sinuses are well-aerated. The included ocular globes and orbital contents are non-suspicious. OTHER: None. IMPRESSION: 1. No acute  intracranial process or metastasis. 2. Mild parenchymal brain volume loss for age. 3. Mild chronic small vessel ischemic disease. Electronically Signed   By: Elon Alas M.D.   On: 05/03/2017 23:56   Mr Thoracic Spine W Wo Contrast  Result Date: 05/04/2017 CLINICAL DATA:  Syncopal episode on way to bathroom. No head injury. Feeling unwell for 4 days. History of LEFT lung mass, alcohol abuse, hypertension, hyperlipidemia. EXAM: MRI THORACIC WITHOUT AND WITH CONTRAST TECHNIQUE: Multiplanar and multiecho pulse sequences of the thoracic spine  were obtained without and with intravenous contrast. CONTRAST:  32mL MULTIHANCE GADOBENATE DIMEGLUMINE 529 MG/ML IV SOLN COMPARISON:  CT chest May 03, 2017 at 0117 hours FINDINGS: Mildly motion degraded examination. ALIGNMENT: Maintenance of the thoracic kyphosis. No malalignment. VERTEBRAE/DISCS: Vertebral bodies are intact. Multiple low T1, I bright T2 osseous metastasis, predominately enhancing metastasis stoma which demonstrate central necrosis at T2, T4, T7, T9, T10, T12, L1 and L2. No pathologic fracture. Old mild T3, T4, moderate to severe T8 compression fracture. Disc desiccation all levels with multilevel mid to lower thoracic moderate chronic discogenic endplate changes. CORD: Thoracic spinal cord is normal morphology and signal characteristics to the level of the conus medullaris which terminates at T12-L1. Axial sequences are mildly motion degraded, multiple tiny foci T1 shortening most compatible with flow void without convincing evidence of metastasis. PREVERTEBRAL AND PARASPINAL SOFT TISSUES: Faint enhancement RIGHT T4-5 inner costal space of equivocal for metastasis (sagittal 1/17) DISC LEVELS: Multilevel small broad-based disc bulges without canal stenosis or neural foraminal narrowing at any level. IMPRESSION: 1. 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 2. Old mild T3, old mild T4 and moderate to severe old T8 compression  fractures. 3. No significant canal stenosis or neural foraminal narrowing at any level. Electronically Signed   By: Elon Alas M.D.   On: 05/04/2017 00:10   Ct Abdomen Pelvis W Contrast  Result Date: 05/04/2017 CLINICAL DATA:  Unintended weight loss, abdominal pain. New lung mass. EXAM: CT ABDOMEN AND PELVIS WITH CONTRAST TECHNIQUE: Multidetector CT imaging of the abdomen and pelvis was performed using the standard protocol following bolus administration of intravenous contrast. CONTRAST:  100 cc Isovue 300 IV COMPARISON:  Chest CT 05/03/2017. FINDINGS: Lower chest: Are lung bases clear.  No effusions. Hepatobiliary: Suspect mild diffuse fatty infiltration of the liver. Area of focal scratched at area of more focal fatty infiltration near the falciform ligament. Gallbladder unremarkable. Pancreas: No focal abnormality or ductal dilatation. Spleen: No focal abnormality.  Normal size. Adrenals/Urinary Tract: Small bilateral renal cysts. No hydronephrosis. Adrenal glands and urinary bladder unremarkable. Stomach/Bowel: Sigmoid diverticulosis. No active diverticulitis. Stomach and small bowel decompressed, unremarkable. Vascular/Lymphatic: Aortic and iliac calcifications. No evidence of aneurysm or adenopathy. Reproductive: No visible focal abnormality. Other: No free fluid or free air. Small umbilical and supraumbilical hernias containing fat. Musculoskeletal: No acute bony abnormality. IMPRESSION: Suspect mild fatty infiltration of the liver. Small bilateral renal cysts. Aortoiliac atherosclerosis. Sigmoid diverticulosis. No acute findings. Electronically Signed   By: Rolm Baptise M.D.   On: 05/04/2017 12:07   Nm Pet Image Initial (pi) Skull Base To Thigh  Result Date: 05/11/2017 CLINICAL DATA:  Initial treatment strategy for left upper lobe lung lesion. EXAM: NUCLEAR MEDICINE PET SKULL BASE TO THIGH TECHNIQUE: 8.1 mCi F-18 FDG was injected intravenously. Full-ring PET imaging was performed from the  skull base to thigh after the radiotracer. CT data was obtained and used for attenuation correction and anatomic localization. FASTING BLOOD GLUCOSE:  Value: 95 mg/dl COMPARISON:  Chest CT 05/03/2017 FINDINGS: NECK: No hypermetabolic lymph nodes in the neck. CHEST: Nonenlarged and weakly hypermetabolic left axillary lymph nodes may be inflammatory and related to the left scapular lesion. 2 cm left upper lobe lung lesion adjacent to the pericardium on image number 33 is hypermetabolic with SUV max of 9.6. This is likely a primary lung neoplasm. No definite metastatic pulmonary nodules. The centrilobular nodular appearance on the prior chest CT has improved and was more likely due to respiratory bronchiolitis or hypersensitivity pneumonitis. Single  hypermetabolic prevascular lymph node on image number 63 measures 6.5 mm and SUV max is 4.22. ABDOMEN/PELVIS: No abnormal hypermetabolic activity within the liver, pancreas, adrenal glands, or spleen. No hypermetabolic lymph nodes in the abdomen or pelvis. SKELETON: Diffuse osseous metastatic disease. This involves the axial and appendicular skeleton. There are bilateral scapular lesions, numerous thoracic and lumbar vertebral body lesions, rib lesions, pelvic bone lesions and left hip lesions. Areas of cortical destruction are noted notably involving the lesion in the left scapular body and the right iliac bone. No spinal canal compromise. Index lesion involving left scapula has an SUV max of 15.3. The left-sided T3 lesion has an SUV max of 11.7. Right iliac bone lesion has SUV max of 12.5 IMPRESSION: 1. Hypermetabolic left upper lobe lung lesion consistent with primary lung neoplasm. There is also a metabolically active left prevascular lymph node. 2. Diffuse lytic osseous metastatic disease. 3. No findings for pulmonary metastatic disease or abdominal/pelvic metastatic disease. Electronically Signed   By: Marijo Sanes M.D.   On: 05/11/2017 12:56     Assessment &  Plan:   No problem-specific Assessment & Plan notes found for this encounter.     Rexene Edison, NP 05/15/2017

## 2017-05-16 ENCOUNTER — Encounter: Payer: Self-pay | Admitting: *Deleted

## 2017-05-16 ENCOUNTER — Telehealth: Payer: Self-pay | Admitting: *Deleted

## 2017-05-16 DIAGNOSIS — R918 Other nonspecific abnormal finding of lung field: Secondary | ICD-10-CM

## 2017-05-16 NOTE — Progress Notes (Signed)
Tanner [name] called back. I updated him on appt for clinic on 05/24/17.  He was thankful for the call and appt

## 2017-05-16 NOTE — Telephone Encounter (Signed)
Oncology Nurse Navigator Documentation  Oncology Nurse Navigator Flowsheets 05/16/2017  Navigator Location [facility]  Referral date to RadOnc/MedOnc 05/15/2017  Navigator Encounter Type Telephone/I received a referral on Tanner [name] yesterday. I called to schedule him to be seen. I was unable to reach him. I left vm message with my name and phone number to call.   Telephone Outgoing Call  Treatment Phase Pre-Tx/Tx Discussion  Barriers/Navigation Needs Coordination of Care  Interventions Coordination of Care  Coordination of Care Appts  Acuity Level 1  Time Spent with Patient 15

## 2017-05-17 ENCOUNTER — Telehealth: Payer: Self-pay

## 2017-05-17 LAB — GASTROINTESTINAL PATHOGEN PANEL PCR
C. difficile Tox A/B, PCR: NOT DETECTED
Campylobacter, PCR: NOT DETECTED
Cryptosporidium, PCR: NOT DETECTED
E COLI (ETEC) LT/ST, PCR: NOT DETECTED
E COLI (STEC) STX1/STX2, PCR: NOT DETECTED
E COLI 0157, PCR: NOT DETECTED
Giardia lamblia, PCR: NOT DETECTED
NOROVIRUS, PCR: NOT DETECTED
Rotavirus A, PCR: NOT DETECTED
SALMONELLA, PCR: NOT DETECTED
SHIGELLA, PCR: NOT DETECTED

## 2017-05-17 LAB — FECAL LACTOFERRIN, QUANT
Fecal Lactoferrin: POSITIVE — AB
MICRO NUMBER:: 81246065
SPECIMEN QUALITY:: ADEQUATE

## 2017-05-17 MED ORDER — AMLODIPINE BESYLATE 10 MG PO TABS: 10.0000 mg | ORAL_TABLET | Freq: Every day | ORAL | 1 refills | Status: DC

## 2017-05-17 NOTE — Telephone Encounter (Signed)
Okay to refill? 

## 2017-05-17 NOTE — Telephone Encounter (Signed)
Received fax from [facility] on [address] for amlodipine. I wanted to make sure it was ok to refill this since it has been a while and his last BP was on the lower end. Please advise.

## 2017-05-17 NOTE — Telephone Encounter (Signed)
Rx sent 

## 2017-05-21 ENCOUNTER — Other Ambulatory Visit: Payer: Self-pay | Admitting: Nurse Practitioner

## 2017-05-21 ENCOUNTER — Other Ambulatory Visit: Payer: Self-pay

## 2017-05-21 DIAGNOSIS — R197 Diarrhea, unspecified: Secondary | ICD-10-CM

## 2017-05-21 MED ORDER — DIAZEPAM 5 MG PO TABS: 5.0000 mg | ORAL_TABLET | Freq: Two times a day (BID) | ORAL | 0 refills | Status: DC | PRN

## 2017-05-21 MED ORDER — LISINOPRIL 40 MG PO TABS: ORAL_TABLET | ORAL | 1 refills | Status: DC

## 2017-05-21 NOTE — Telephone Encounter (Signed)
error 

## 2017-05-21 NOTE — Telephone Encounter (Signed)
Patient has called in regard to this script refill.  Patient can be reached back at [phone]. Patient is also needing lisinopril.

## 2017-05-23 ENCOUNTER — Telehealth: Payer: Self-pay | Admitting: Internal Medicine

## 2017-05-23 NOTE — Telephone Encounter (Signed)
Confirmed [facility] appointment with patient for 05/24/17

## 2017-05-24 ENCOUNTER — Ambulatory Visit (INDEPENDENT_AMBULATORY_CARE_PROVIDER_SITE_OTHER): Payer: Medicare HMO | Admitting: Physician Assistant

## 2017-05-24 ENCOUNTER — Encounter: Payer: Self-pay | Admitting: Internal Medicine

## 2017-05-24 ENCOUNTER — Ambulatory Visit (HOSPITAL_BASED_OUTPATIENT_CLINIC_OR_DEPARTMENT_OTHER): Payer: Medicare HMO | Admitting: Internal Medicine

## 2017-05-24 ENCOUNTER — Encounter: Payer: Self-pay | Admitting: Physician Assistant

## 2017-05-24 ENCOUNTER — Telehealth: Payer: Self-pay

## 2017-05-24 ENCOUNTER — Other Ambulatory Visit (HOSPITAL_BASED_OUTPATIENT_CLINIC_OR_DEPARTMENT_OTHER): Payer: Medicare HMO

## 2017-05-24 ENCOUNTER — Ambulatory Visit
Admission: RE | Admit: 2017-05-24 | Discharge: 2017-05-24 | Disposition: A | Discharge status: Home / Self Care | Payer: Medicare HMO | Source: Ambulatory Visit | Attending: Radiation Oncology | Admitting: Radiation Oncology

## 2017-05-24 ENCOUNTER — Encounter: Payer: Medicare HMO | Admitting: Cardiothoracic Surgery

## 2017-05-24 VITALS — BP 100/68 | HR 78 | Ht 70.0 in | Wt 165.0 lb

## 2017-05-24 VITALS — BP 118/73 | HR 71 | Temp 97.9°F | Resp 18 | Ht 70.0 in | Wt 165.4 lb

## 2017-05-24 VITALS — BP 118/73 | HR 71 | Temp 97.9°F | Resp 18

## 2017-05-24 DIAGNOSIS — Z885 Allergy status to narcotic agent status: Secondary | ICD-10-CM | POA: Insufficient documentation

## 2017-05-24 DIAGNOSIS — R197 Diarrhea, unspecified: Secondary | ICD-10-CM | POA: Diagnosis not present

## 2017-05-24 DIAGNOSIS — Z8261 Family history of arthritis: Secondary | ICD-10-CM | POA: Insufficient documentation

## 2017-05-24 DIAGNOSIS — M898X1 Other specified disorders of bone, shoulder: Secondary | ICD-10-CM | POA: Insufficient documentation

## 2017-05-24 DIAGNOSIS — M545 Low back pain: Secondary | ICD-10-CM | POA: Insufficient documentation

## 2017-05-24 DIAGNOSIS — Z801 Family history of malignant neoplasm of trachea, bronchus and lung: Secondary | ICD-10-CM | POA: Insufficient documentation

## 2017-05-24 DIAGNOSIS — I10 Essential (primary) hypertension: Secondary | ICD-10-CM | POA: Insufficient documentation

## 2017-05-24 DIAGNOSIS — M25551 Pain in right hip: Secondary | ICD-10-CM | POA: Insufficient documentation

## 2017-05-24 DIAGNOSIS — R918 Other nonspecific abnormal finding of lung field: Secondary | ICD-10-CM

## 2017-05-24 DIAGNOSIS — C7951 Secondary malignant neoplasm of bone: Secondary | ICD-10-CM

## 2017-05-24 DIAGNOSIS — M25552 Pain in left hip: Secondary | ICD-10-CM | POA: Insufficient documentation

## 2017-05-24 DIAGNOSIS — Z87891 Personal history of nicotine dependence: Secondary | ICD-10-CM | POA: Insufficient documentation

## 2017-05-24 DIAGNOSIS — Z882 Allergy status to sulfonamides status: Secondary | ICD-10-CM | POA: Insufficient documentation

## 2017-05-24 DIAGNOSIS — Z825 Family history of asthma and other chronic lower respiratory diseases: Secondary | ICD-10-CM | POA: Insufficient documentation

## 2017-05-24 DIAGNOSIS — Z79899 Other long term (current) drug therapy: Secondary | ICD-10-CM | POA: Insufficient documentation

## 2017-05-24 DIAGNOSIS — C801 Malignant (primary) neoplasm, unspecified: Secondary | ICD-10-CM | POA: Insufficient documentation

## 2017-05-24 DIAGNOSIS — G893 Neoplasm related pain (acute) (chronic): Secondary | ICD-10-CM | POA: Diagnosis not present

## 2017-05-24 DIAGNOSIS — Z888 Allergy status to other drugs, medicaments and biological substances status: Secondary | ICD-10-CM | POA: Insufficient documentation

## 2017-05-24 DIAGNOSIS — Z8546 Personal history of malignant neoplasm of prostate: Secondary | ICD-10-CM | POA: Insufficient documentation

## 2017-05-24 DIAGNOSIS — R69 Illness, unspecified: Secondary | ICD-10-CM | POA: Diagnosis not present

## 2017-05-24 DIAGNOSIS — Z510 Encounter for antineoplastic radiation therapy: Secondary | ICD-10-CM | POA: Insufficient documentation

## 2017-05-24 DIAGNOSIS — K219 Gastro-esophageal reflux disease without esophagitis: Secondary | ICD-10-CM | POA: Insufficient documentation

## 2017-05-24 DIAGNOSIS — F329 Major depressive disorder, single episode, unspecified: Secondary | ICD-10-CM | POA: Insufficient documentation

## 2017-05-24 DIAGNOSIS — Z800 Family history of malignant neoplasm of digestive organs: Secondary | ICD-10-CM | POA: Insufficient documentation

## 2017-05-24 DIAGNOSIS — Z8673 Personal history of transient ischemic attack (TIA), and cerebral infarction without residual deficits: Secondary | ICD-10-CM | POA: Insufficient documentation

## 2017-05-24 DIAGNOSIS — Z86718 Personal history of other venous thrombosis and embolism: Secondary | ICD-10-CM | POA: Insufficient documentation

## 2017-05-24 DIAGNOSIS — R599 Enlarged lymph nodes, unspecified: Secondary | ICD-10-CM | POA: Insufficient documentation

## 2017-05-24 DIAGNOSIS — F419 Anxiety disorder, unspecified: Secondary | ICD-10-CM | POA: Insufficient documentation

## 2017-05-24 LAB — CBC WITH DIFFERENTIAL/PLATELET
BASO%: 0.1 % (ref 0.0–2.0)
Basophils Absolute: 0 10*3/uL (ref 0.0–0.1)
EOS%: 0.8 % (ref 0.0–7.0)
Eosinophils Absolute: 0.1 10*3/uL (ref 0.0–0.5)
HCT: 37.5 % — ABNORMAL LOW (ref 38.4–49.9)
HGB: 12.5 g/dL — ABNORMAL LOW (ref 13.0–17.1)
LYMPH#: 0.8 10*3/uL — AB (ref 0.9–3.3)
LYMPH%: 11 % — AB (ref 14.0–49.0)
MCH: 33.4 pg (ref 27.2–33.4)
MCHC: 33.3 g/dL (ref 32.0–36.0)
MCV: 100.3 fL — ABNORMAL HIGH (ref 79.3–98.0)
MONO#: 1.2 10*3/uL — AB (ref 0.1–0.9)
MONO%: 16.5 % — ABNORMAL HIGH (ref 0.0–14.0)
NEUT#: 5.2 10*3/uL (ref 1.5–6.5)
NEUT%: 71.6 % (ref 39.0–75.0)
Platelets: 234 10*3/uL (ref 140–400)
RBC: 3.74 10*6/uL — AB (ref 4.20–5.82)
RDW: 13.7 % (ref 11.0–14.6)
WBC: 7.3 10*3/uL (ref 4.0–10.3)

## 2017-05-24 LAB — COMPREHENSIVE METABOLIC PANEL
ALBUMIN: 3.2 g/dL — AB (ref 3.5–5.0)
ALK PHOS: 121 U/L (ref 40–150)
ALT: 11 U/L (ref 0–55)
AST: 19 U/L (ref 5–34)
Anion Gap: 7 mEq/L (ref 3–11)
BUN: 10.7 mg/dL (ref 7.0–26.0)
CO2: 25 meq/L (ref 22–29)
Calcium: 9.3 mg/dL (ref 8.4–10.4)
Chloride: 102 mEq/L (ref 98–109)
Creatinine: 0.9 mg/dL (ref 0.7–1.3)
GLUCOSE: 92 mg/dL (ref 70–140)
POTASSIUM: 4.2 meq/L (ref 3.5–5.1)
SODIUM: 134 meq/L — AB (ref 136–145)
Total Bilirubin: 0.23 mg/dL (ref 0.20–1.20)
Total Protein: 7.5 g/dL (ref 6.4–8.3)

## 2017-05-24 MED ORDER — OXYCODONE-ACETAMINOPHEN 5-325 MG PO TABS: 1.0000 | ORAL_TABLET | Freq: Four times a day (QID) | ORAL | 0 refills | Status: DC | PRN

## 2017-05-24 MED ORDER — OXYCODONE HCL 5 MG PO TABS: 5.0000 mg | ORAL_TABLET | Freq: Four times a day (QID) | ORAL | 0 refills | Status: DC | PRN

## 2017-05-24 NOTE — Progress Notes (Signed)
Subjective:    Patient ID: Tanner [name], male    DOB: 1955/11/11, 61 y.o.   MRN: [id number]  HPI Tanner [name] is a pleasant 61 year old white male, new to GI today referred by Brien Few NP for evaluation of diarrhea.  Patient known remotely to Dr. Sharlett Iles and had undergone colonoscopy in September 2010 with finding of sigmoid diverticulosis, diminutive rectal polyp which was a tubular adenoma.  He was recommended to have a 5-year interval follow-up. Patient is currently just dealing with multiple serious medical issues.  He has history of hypertension, prior DVT, COPD, history of C. difficile colitis 2013, prostate cancer status post prostatectomy, history of CVA with spastic hemiplegia. He has recently been found on imaging including PET scan done on 05/11/2017 to have a hypermetabolic left upper lobe lung mass and diffuse osseous lesions consistent with metastases.  He is scheduled to see oncology.  He was recently evaluated by pulmonary and this lesion is felt to represent underlying lung cancer but by notes is not amenable to biopsy by bronchoscopy. Patient says he started having diarrhea a little over a month ago and has been having 2-3 bowel movements per day He describes the stool as mushy but not liquid, there has been no blood.  He has had 1 or 2 episodes of incontinence since onset a month ago.  He denies any problems with abdominal pain or cramping. He was hospitalized 10/24 through 05/05/2017 after a near syncopal episode.  He has been treated with a course of Zithromax and Vantin for possible atypical pneumonia says the diarrhea started before that. Since discharge from the hospital he had C. difficile Quik scan negative on 05/04/2017 then GI path panel -11 12/2016 and lactoferrin was positive. He has been using Imodium as needed which has been helpful. .   Review of Systems;Pertinent positive and negative review of systems were noted in the above HPI section.  All other review of  systems was otherwise negative.  Outpatient Encounter Medications as of 05/24/2017  Medication Sig  . amLODipine (NORVASC) 10 MG tablet Take 1 tablet (10 mg total) daily by mouth.  . diazepam (VALIUM) 5 MG tablet Take 1 tablet (5 mg total) 2 (two) times daily as needed by mouth for anxiety or muscle spasms.  Marland Kitchen FLUoxetine (PROZAC) 40 MG capsule TAKE 1 CAPSULE BY MOUTH EVERY DAY  . Glycopyrrolate-Formoterol (BEVESPI AEROSPHERE) 9-4.8 MCG/ACT AERO Inhale 2 puffs 2 (two) times daily into the lungs.  Marland Kitchen lisinopril (PRINIVIL,ZESTRIL) 40 MG tablet TAKE ONE (1) TABLET BY MOUTH EVERY DAY  . loperamide (IMODIUM) 2 MG capsule Take 2 capsules (4 mg total) by mouth every 8 (eight) hours as needed for diarrhea or loose stools. Over the counter  . metoprolol tartrate (LOPRESSOR) 100 MG tablet Take 1 tablet (100 mg total) by mouth 2 (two) times daily.  Marland Kitchen omeprazole (PRILOSEC) 40 MG capsule TAKE 1 CAPSULE BY MOUTH TWICE A DAY  . simvastatin (ZOCOR) 20 MG tablet TAKE ONE (1) TABLET BY MOUTH EVERY DAY  . thiamine 100 MG tablet Take 1 tablet (100 mg total) by mouth daily.  . traZODone (DESYREL) 50 MG tablet Take 1 tablet (50 mg total) by mouth at bedtime as needed. for sleep   No facility-administered encounter medications on file as of 05/24/2017.    Allergies  Allergen Reactions  . Dilaudid [Hydromorphone Hcl] Nausea And Vomiting    Pre pt history  . Oxycodone Nausea And Vomiting  . Procaine Hcl Other (See Comments)    Patient states  he is allergic to novicaine  . Zofran [Ondansetron] Nausea And Vomiting  . Sulfa Antibiotics Rash   Patient Active Problem List   Diagnosis Date Noted  . COPD with acute exacerbation ([place]) 05/15/2017  . Multiple lung nodules on CT   . Near syncope 05/03/2017  . Lung mass 05/03/2017  . Hyperlipidemia 09/20/2016  . Protein-calorie malnutrition ([place]) 08/17/2016  . Thoracic compression fracture, closed, initial encounter ([place]) 08/17/2016  . Alcohol dependence ([place])  08/17/2016  . Tobacco dependence 08/17/2016  . Osteoporosis 08/17/2016  . Hip fracture ([place]) 08/16/2016  . Headache 02/24/2016  . Hypertensive urgency 02/03/2016  . Shortness of breath 07/28/2015  . Generalized anxiety disorder 07/28/2015  . Medicare annual wellness visit, subsequent 05/24/2015  . Acute DVT of right tibial vein ([place]) 02/27/2015  . Tibia fracture 02/20/2015  . Syncope 02/18/2015  . Essential hypertension 02/18/2015  . History of stroke 02/18/2015  . Fracture of tibia, right, closed   . C. difficile colitis 09/11/2013  . UTI (urinary tract infection) 09/11/2013  . Essential hypertension, benign 09/11/2013  . Ileus ([place]) 09/10/2013  . Spastic hemiplegia affecting dominant side ([place]) 09/09/2013  . Ileus, postoperative ([place]) 09/09/2013  . Hypokalemia 09/09/2013  . Nonspecific (abnormal) findings on radiological and other examination of gastrointestinal tract 09/09/2013  . CVA (cerebral infarction) 09/05/2013  . 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 Malignant neoplasm of prostate ([place]) 08/29/2013  . Hyponatremia 12/11/2012    Class: Chronic  . Ankle fracture 12/10/2012   Social History   Socioeconomic History  . Marital status: Divorced    Spouse name: Not on file  . Number of children: 1  . Years of education: 75  . Highest education level: Not on file  Social Needs  . Financial resource strain: Not on file  . Food insecurity - worry: Not on file  . Food insecurity - inability: Not on file  . Transportation needs - medical: Not on file  . Transportation needs - non-medical: Not on file  Occupational History  . Occupation: disabled    Comment: Fingers, scolisosis,   Tobacco Use  . Smoking status: Former Smoker    Packs/day: 1.50    Years: 30.00    Pack years: 45.00    Types: Cigarettes    Last attempt to quit: 03/09/2017    Years since quitting: 0.2  . Smokeless tobacco: Never Used  Substance and Sexual Activity  . Alcohol use: Yes     Alcohol/week: 3.0 - 6.0 oz    Types: 5 - 10 Cans of beer per week  . Drug use: Yes    Types: Marijuana  . Sexual activity: Not on file  Other Topics Concern  . Not on file  Social History Narrative   Denies    Mr. Dominique's family history includes Arthritis in his mother; COPD in his father; Emphysema in his father; Lung cancer in his father; Pancreatic cancer in his sister.      Objective:    Vitals:   05/24/17 1032  BP: 100/68  Pulse: 78    Physical Exam well-developed older white male in no acute distress, accompanied by his daughter blood pressure 100/68 pulse 78, height 5 foot 10, weight 165, BMI 23.6.  HEENT; nontraumatic normocephalic EOMI PERRLA sclera anicteric, Cardiovascular ;regular rate and rhythm with S1-S2, Pulmonary; somewhat decreased breath sounds bilaterally, Abdomen; soft, bowel sounds are present there is no palpable mass or hepatosplenomegaly is nontender nondistended, Rectal; exam not done, Extremities ;no clubbing cyanosis or edema  skin warm and dry, Neuro ;psych mood and affect appropriate       Assessment & Plan:   #24 61 year old white male with 1 month history of diarrhea with 1-3 bowel movements per day-recent infectious workup negative including GI path panel, lactoferrin was positive. Etiology is not clear-patient does have prior history of C. difficile, however current diarrhea appears mild.  He is also been on recent antibiotics and may have in part an antibiotic induced diarrhea. #2 history of tubular adenomatous colon polyp-overdue for colonoscopy with last colon September 2010 #3 history of CVA with spastic hemiplegia #4.  History of prostate CA status post prostatectomy #5.  COPD #6.  New diagnosis of left upper lobe lung mass and diffuse osseous metastases question lung primary, workup in progress and patient is to see oncology this week. #7 history of DVT #8 history of EtOH abuse  Plan; patient is advised to start taking 1 Imodium p.o. every  morning when he gets up, then may use up to a total of 4/day as needed Start align 1 p.o. every morning times 2 months Patient will call back in 7-10 days, if diarrhea is persisting and has not improved may give him an empiric course of metronidazole. He should ideally have another colonoscopy for follow-up of adenomatous colon polyps and will also need for further evaluation of diarrhea if persisting.  He is hesitant to schedule currently until he knows the plan from oncology perspective.  We can discuss further at follow-up.  Skanda Worlds S Lakeshia Dohner PA-C 05/24/2017   Cc: [name], NP

## 2017-05-24 NOTE — Progress Notes (Signed)
Agree with assessment and plans. No plans for colonoscopy in light of probable lung cancer

## 2017-05-24 NOTE — Progress Notes (Signed)
Tanner [name] [phone]   [phone] Multidisciplinary thoracic oncology clinic CONSULT NOTE  REFERRING PHYSICIAN: Dr. Tera [name]  REASON FOR CONSULTATION:  61 years old white male with highly suspicious for lung cancer.  HPI Tanner [name] is a 61 y.o. male with past medical history significant for alcohol and smoking abuse, alcoholic hepatitis, and anxiety/depression, deep venous thrombosis of the left leg in 2016, GERD, hypertension, history of prostate cancer with Gleason score of 7 (4+3) diagnosed in November 2014 status post radical prostatectomy, stroke and C. difficile.  The patient was admitted to [facility] complaining of weakness in the lower extremities, syncope as well as nausea and vomiting and a months of diarrhea. He was found to have dehydration at that time.  During his evaluation, CT angiogram of the chest was performed on 05/03/2017 and that showed a spiculated left upper lobe pulmonary nodule abutting the anterior aspect of the mediastinum, measuring approximately 2.2 x 2.1 x 1.4 cm and concerning for malignancy.  This was new from 2017.  The mediastinum is otherwise unremarkable.  The scan also showed questionable lytic lesions within vertebral bodies T1 and T2 apparently new from 2017.  There was also chronic loss of height at vertebral bodies T8 and T9.  MRI of the brain on May 03, 2017 showed no acute intracranial process or metastasis.  MRI of the thoracic spines on May 03, 2017 showed numerous thoracic osseous metastases without pathologic fracture.  There was old mild T3 and old mild T4 and moderate to severe old T8 compression fractures.  There was no significant canal stenosis or neural foraminal narrowing at any level.  CT of the abdomen and pelvis on 05/04/2017 showed mild fatty infiltration of the liver with a small bilateral renal cysts.  A PET scan on May 11, 2017 showed hypermetabolic left upper lobe lung  lesion consistent with primary lung neoplasm.  There was also metabolically active left prevascular lymph node.  There was diffuse lytic osseous metastatic disease.  There was no findings for pulmonary metastatic disease or abdominal/pelvic metastatic disease.  PSA performed on June 03, 2017 was less than 0.01 The patient was referred to the multidisciplinary thoracic oncology clinic today for further evaluation and recommendation regarding his condition. When seen today the patient continues to have pain in the knees and legs as well as shoulder.  He has difficulty raising his arms above the shoulder.  He also continues to have pain in the lower back and rib cages.  He has shortness of breath at baseline increased with exertion with mild cough with no hemoptysis.  The patient denied having any weight loss or night sweats.  He has no headache or visual changes.  He continues to have a few episodes of diarrhea at regular basis. Family history significant for mother died from pulmonary embolism, father had COPD and lung cancer and sister had pancreatic cancer. The patient is single and has 1 daughter.  He was accompanied today by his daughter Tanner [name], his ex-wife Tanner [name] and his sister Tanner [name].  The patient used to work in [employer].  He has a history of smoking 1.5 pack/day for around 40 years and quit on March 09, 2017.  He also continues to drink up to 10 beers every day.  He also has a history of marijuana abuse.   HPI  Past Medical History:  Diagnosis Date  . Alcohol abuse   . Alcoholic hepatitis   . Allergy   .  Anxiety   . 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 C. difficile colitis   . Depression   . DVT (deep venous thrombosis) ([place]) 2015  . GERD (gastroesophageal reflux disease)   . Hypertension   . Ileus ([place]) 09/2013  . 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 Shortness of breath    new onset- occasionally-at rest and exertion  . Substance abuse ([place])   . TIA  (transient ischemic attack) 08/2013    Past Surgical History:  Procedure Laterality Date  . HAND SURGERY Right   . HERNIA REPAIR    . INTRAMEDULLARY (IM) NAIL INTERTROCHANTERIC Right 08/17/2016   Procedure: INTRAMEDULLARY (IM) NAIL RIGHT HIP;  Surgeon: Tania Ade, MD;  Location: [place];  Service: Orthopedics;  Laterality: Right;  . LYMPHADENECTOMY Bilateral 08/29/2013   Procedure: LYMPHADENECTOMY "BILATERAL PELVIC LYMPH NODE DISSECTION";  Surgeon: Molli Hazard, MD;  Location: [facility];  Service: Urology;  Laterality: Bilateral;  . ORIF ANKLE FRACTURE Right 12/10/2012   Procedure: OPEN REDUCTION INTERNAL FIXATION (ORIF) ANKLE FRACTURE;  Surgeon: Hessie Dibble, MD;  Location: [facility];  Service: Orthopedics;  Laterality: Right;  . ROBOT ASSISTED LAPAROSCOPIC RADICAL PROSTATECTOMY N/A 08/29/2013   Procedure: ROBOTIC ASSISTED LAPAROSCOPIC RADICAL PROSTATECTOMY LEVEL 2, Lysis of adhesions;  Surgeon: Molli Hazard, MD;  Location: [facility];  Service: Urology;  Laterality: N/A;    Family History  Problem Relation Age of Onset  . Lung cancer Father        smoker  . COPD Father   . Emphysema Father   . Arthritis Mother   . Pancreatic cancer Sister     Social History Social History   Tobacco Use  . Smoking status: Former Smoker    Packs/day: 1.50    Years: 30.00    Pack years: 45.00    Types: Cigarettes    Last attempt to quit: 03/09/2017    Years since quitting: 0.2  . Smokeless tobacco: Never Used  Substance Use Topics  . Alcohol use: Yes    Alcohol/week: 3.0 - 6.0 oz    Types: 5 - 10 Cans of beer per week  . Drug use: Yes    Types: Marijuana    Allergies  Allergen Reactions  . Dilaudid [Hydromorphone Hcl] Nausea And Vomiting    Pre pt history  . Oxycodone Nausea And Vomiting  . Procaine Hcl Other (See Comments)    Patient states he is allergic to novicaine  . Zofran [Ondansetron] Nausea And Vomiting  . Sulfa Antibiotics Rash    Current Outpatient  Medications  Medication Sig Dispense Refill  . amLODipine (NORVASC) 10 MG tablet Take 1 tablet (10 mg total) daily by mouth. 90 tablet 1  . diazepam (VALIUM) 5 MG tablet Take 1 tablet (5 mg total) 2 (two) times daily as needed by mouth for anxiety or muscle spasms. 60 tablet 0  . FLUoxetine (PROZAC) 40 MG capsule TAKE 1 CAPSULE BY MOUTH EVERY DAY 90 capsule 1  . Glycopyrrolate-Formoterol (BEVESPI AEROSPHERE) 9-4.8 MCG/ACT AERO Inhale 2 puffs 2 (two) times daily into the lungs. 1 Inhaler 3  . lisinopril (PRINIVIL,ZESTRIL) 40 MG tablet TAKE ONE (1) TABLET BY MOUTH EVERY DAY 90 tablet 1  . loperamide (IMODIUM) 2 MG capsule Take 2 capsules (4 mg total) by mouth every 8 (eight) hours as needed for diarrhea or loose stools. Over the counter 30 capsule 0  . metoprolol tartrate (LOPRESSOR) 100 MG tablet Take 1 tablet (100 mg total)  by mouth 2 (two) times daily. 180 tablet 1  . omeprazole (PRILOSEC) 40 MG capsule TAKE 1 CAPSULE BY MOUTH TWICE A DAY 120 capsule 0  . simvastatin (ZOCOR) 20 MG tablet TAKE ONE (1) TABLET BY MOUTH EVERY DAY 90 tablet 1  . thiamine 100 MG tablet Take 1 tablet (100 mg total) by mouth daily. 30 tablet 3  . traZODone (DESYREL) 50 MG tablet Take 1 tablet (50 mg total) by mouth at bedtime as needed. for sleep 30 tablet 0   No current facility-administered medications for this visit.     Review of Systems  Constitutional: positive for fatigue Eyes: negative Ears, nose, mouth, throat, and face: negative Respiratory: positive for dyspnea on exertion Cardiovascular: negative Gastrointestinal: positive for diarrhea Genitourinary:negative Integument/breast: negative Hematologic/lymphatic: negative Musculoskeletal:positive for arthralgias, bone pain and muscle weakness Neurological: negative Behavioral/Psych: negative Endocrine: negative Allergic/Immunologic: negative  Physical Exam  ZOX:WRUEA, healthy, no distress, well nourished and well developed SKIN: skin color,  texture, turgor are normal, no rashes or significant lesions HEAD: Normocephalic, No masses, lesions, tenderness or abnormalities EYES: normal, PERRLA, Conjunctiva are pink and non-injected EARS: External ears normal, Canals clear OROPHARYNX:no exudate, no erythema and lips, buccal mucosa, and tongue normal  NECK: supple, no adenopathy, no JVD LYMPH:  no palpable lymphadenopathy, no hepatosplenomegaly LUNGS: clear to auscultation , and palpation HEART: regular rate & rhythm, no murmurs and no gallops ABDOMEN:abdomen soft, non-tender, normal bowel sounds and no masses or organomegaly BACK: No CVA tenderness, Range of motion is normal EXTREMITIES:no joint deformities, effusion, or inflammation, no edema, no skin discoloration  NEURO: alert & oriented x 3 with fluent speech, no focal motor/sensory deficits  PERFORMANCE STATUS: ECOG 1  LABORATORY DATA: Lab Results  Component Value Date   WBC 7.3 05/24/2017   HGB 12.5 (L) 05/24/2017   HCT 37.5 (L) 05/24/2017   MCV 100.3 (H) 05/24/2017   PLT 234 05/24/2017      Chemistry      Component Value Date/Time   NA 132 (L) 05/08/2017 1217   K 5.2 (H) 05/08/2017 1217   CL 98 05/08/2017 1217   CO2 25 05/08/2017 1217   BUN 16 05/08/2017 1217   CREATININE 1.24 05/08/2017 1217      Component Value Date/Time   CALCIUM 10.0 05/08/2017 1217   ALKPHOS 51 08/17/2016 0001   AST 40 08/17/2016 0001   ALT 34 08/17/2016 0001   BILITOT 0.5 08/17/2016 0001       RADIOGRAPHIC STUDIES: Dg Chest 2 View  Result Date: 05/02/2017 CLINICAL DATA:  61 year old male with syncope and chest tightness. EXAM: CHEST  2 VIEW COMPARISON:  Chest radiograph dated 08/16/2016 and chest CT dated 07/23/2015 FINDINGS: The lungs are clear. There is no pleural effusion or pneumothorax. The cardiac silhouette is within normal limits. Appy there is osteopenia with degenerative changes of the spine. Old midthoracic compression deformity similar to the CT of 07/23/2015 none no  acute osseous pathology. IMPRESSION: No active cardiopulmonary disease. Electronically Signed   By: Anner Crete M.D.   On: 05/02/2017 23:43   Ct Angio Chest Pe W And/or Wo Contrast  Result Date: 05/03/2017 CLINICAL DATA:  Acute onset of shortness of breath. Initial encounter. EXAM: CT ANGIOGRAPHY CHEST WITH CONTRAST TECHNIQUE: Multidetector CT imaging of the chest was performed using the standard protocol during bolus administration of intravenous contrast. Multiplanar CT image reconstructions and MIPs were obtained to evaluate the vascular anatomy. There was partial extravasation of 40 mL of contrast into the patient's right antecubital  fossa; the extravasation site was evaluated by the radiologist. An additional peripheral IV catheter could not be successfully placed. CONTRAST:  60 mL of Isovue 370 IV contrast COMPARISON:  Chest radiograph performed 05/02/2017, and CTA of the chest performed 07/23/2015 FINDINGS: Cardiovascular: There is no evidence of central pulmonary embolus. Evaluation for pulmonary embolus is suboptimal due to partial extravasation of the contrast bolus. The heart is normal in size. The thoracic aorta demonstrates minimal calcification. Mild calcification is seen along the proximal great vessels. Mediastinum/Nodes: There is a spiculated left upper lobe pulmonary nodule abutting the anterior aspect of the mediastinum, measuring approximately 2.2 x 2.1 x 1.4 cm and concerning for malignancy. This is new from 2017. The mediastinum is otherwise unremarkable. No mediastinal lymphadenopathy is seen. No pericardial effusion is identified. The visualized portions of the thyroid gland are unremarkable. No axillary lymphadenopathy is seen. Lungs/Pleura: Scattered nodular opacities are seen throughout both lungs. There is suggestion of a miliary pattern, though some of the opacities are more diffuse. This may reflect atypical pneumonia. Fungal pneumonia or viral pneumonitis cannot be excluded.  Tuberculosis is considered less likely. No pleural effusion or pneumothorax is seen. Upper Abdomen: The visualized portions of the liver and spleen are unremarkable. Musculoskeletal: No acute osseous abnormalities are identified. There is question of lytic lesions within vertebral bodies T1 and T2, apparently new from 2017. There is chronic loss of height at vertebral bodies T8 and T9. The visualized musculature is unremarkable in appearance. Review of the MIP images confirms the above findings. IMPRESSION: 1. No evidence of central pulmonary embolus. 2. Scattered nodular opacities throughout both lungs. Suggestion of a miliary pattern, though some of the opacities are more diffuse. This is concerning for atypical pneumonia. Fungal pneumonia or viral pneumonitis cannot be excluded. Tuberculosis is considered less likely. Would correlate with the patient's symptoms. 3. Spiculated left upper lobe pulmonary nodule abutting the anterior aspect of the mediastinum, new from 2017. This measures 2.2 x 2.1 x 1.4 cm, and is concerning for primary bronchogenic malignancy. Tissue diagnosis and/or PET/CT would be helpful for further evaluation. 4. Question of lytic lesions within vertebral bodies T1 and T2, apparently new from 2017. This could be further assessed on bone scan or the PET/CT, as deemed clinically appropriate. Electronically Signed   By: Garald Balding M.D.   On: 05/03/2017 02:08   Mr Jeri Cos ZJ Contrast  Result Date: 05/03/2017 CLINICAL DATA:  Syncopal episode on way to bathroom. No head injury. Feeling unwell for 4 days. History of LEFT lung mass, alcohol abuse, hypertension, hyperlipidemia. EXAM: MRI HEAD WITHOUT AND WITH CONTRAST TECHNIQUE: Multiplanar, multiecho pulse sequences of the brain and surrounding structures were obtained without and with intravenous contrast. CONTRAST:  75mL MULTIHANCE GADOBENATE DIMEGLUMINE 529 MG/ML IV SOLN COMPARISON:  CT HEAD August 16, 2016 FINDINGS: BRAIN: No reduced  diffusion to suggest acute ischemia or hypercellular tumor. No susceptibility artifact to suggest hemorrhage. Patchy supratentorial white matter FLAIR T2 hyperintensities. Mild ex vacuo dilatation LEFT lateral ventricle, ventricles and sulci are overall mildly prominent for patient's age. LEFT midbrain prominent perivascular space, normal variant. No suspicious parenchymal signal, mass or mass effect. No abnormal intraparenchymal or extra-axial enhancement. No abnormal extra-axial fluid collections. VASCULAR: Normal major intracranial vascular flow voids present at skull base. SKULL AND UPPER CERVICAL SPINE: No abnormal sellar expansion. No suspicious calvarial bone marrow signal. Craniocervical junction maintained. SINUSES/ORBITS: The mastoid air-cells and included paranasal sinuses are well-aerated. The included ocular globes and orbital contents are non-suspicious. OTHER: None. IMPRESSION:  1. No acute intracranial process or metastasis. 2. Mild parenchymal brain volume loss for age. 3. Mild chronic small vessel ischemic disease. Electronically Signed   By: Elon Alas M.D.   On: 05/03/2017 23:56   Mr Thoracic Spine W Wo Contrast  Result Date: 05/04/2017 CLINICAL DATA:  Syncopal episode on way to bathroom. No head injury. Feeling unwell for 4 days. History of LEFT lung mass, alcohol abuse, hypertension, hyperlipidemia. EXAM: MRI THORACIC WITHOUT AND WITH CONTRAST TECHNIQUE: Multiplanar and multiecho pulse sequences of the thoracic spine were obtained without and with intravenous contrast. CONTRAST:  14mL MULTIHANCE GADOBENATE DIMEGLUMINE 529 MG/ML IV SOLN COMPARISON:  CT chest May 03, 2017 at 0117 hours FINDINGS: Mildly motion degraded examination. ALIGNMENT: Maintenance of the thoracic kyphosis. No malalignment. VERTEBRAE/DISCS: Vertebral bodies are intact. Multiple low T1, I bright T2 osseous metastasis, predominately enhancing metastasis stoma which demonstrate central necrosis at T2, T4, T7, T9,  T10, T12, L1 and L2. No pathologic fracture. Old mild T3, T4, moderate to severe T8 compression fracture. Disc desiccation all levels with multilevel mid to lower thoracic moderate chronic discogenic endplate changes. CORD: Thoracic spinal cord is normal morphology and signal characteristics to the level of the conus medullaris which terminates at T12-L1. Axial sequences are mildly motion degraded, multiple tiny foci T1 shortening most compatible with flow void without convincing evidence of metastasis. PREVERTEBRAL AND PARASPINAL SOFT TISSUES: Faint enhancement RIGHT T4-5 inner costal space of equivocal for metastasis (sagittal 1/17) DISC LEVELS: Multilevel small broad-based disc bulges without canal stenosis or neural foraminal narrowing at any level. IMPRESSION: 1. 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 2. Old mild T3, old mild T4 and moderate to severe old T8 compression fractures. 3. No significant canal stenosis or neural foraminal narrowing at any level. Electronically Signed   By: Elon Alas M.D.   On: 05/04/2017 00:10   Ct Abdomen Pelvis W Contrast  Result Date: 05/04/2017 CLINICAL DATA:  Unintended weight loss, abdominal pain. New lung mass. EXAM: CT ABDOMEN AND PELVIS WITH CONTRAST TECHNIQUE: Multidetector CT imaging of the abdomen and pelvis was performed using the standard protocol following bolus administration of intravenous contrast. CONTRAST:  100 cc Isovue 300 IV COMPARISON:  Chest CT 05/03/2017. FINDINGS: Lower chest: Are lung bases clear.  No effusions. Hepatobiliary: Suspect mild diffuse fatty infiltration of the liver. Area of focal scratched at area of more focal fatty infiltration near the falciform ligament. Gallbladder unremarkable. Pancreas: No focal abnormality or ductal dilatation. Spleen: No focal abnormality.  Normal size. Adrenals/Urinary Tract: Small bilateral renal cysts. No hydronephrosis. Adrenal glands and urinary bladder unremarkable.  Stomach/Bowel: Sigmoid diverticulosis. No active diverticulitis. Stomach and small bowel decompressed, unremarkable. Vascular/Lymphatic: Aortic and iliac calcifications. No evidence of aneurysm or adenopathy. Reproductive: No visible focal abnormality. Other: No free fluid or free air. Small umbilical and supraumbilical hernias containing fat. Musculoskeletal: No acute bony abnormality. IMPRESSION: Suspect mild fatty infiltration of the liver. Small bilateral renal cysts. Aortoiliac atherosclerosis. Sigmoid diverticulosis. No acute findings. Electronically Signed   By: Rolm Baptise M.D.   On: 05/04/2017 12:07   Nm Pet Image Initial (pi) Skull Base To Thigh  Result Date: 05/11/2017 CLINICAL DATA:  Initial treatment strategy for left upper lobe lung lesion. EXAM: NUCLEAR MEDICINE PET SKULL BASE TO THIGH TECHNIQUE: 8.1 mCi F-18 FDG was injected intravenously. Full-ring PET imaging was performed from the skull base to thigh after the radiotracer. CT data was obtained and used for attenuation correction and anatomic localization. FASTING BLOOD GLUCOSE:  Value: 95  mg/dl COMPARISON:  Chest CT 05/03/2017 FINDINGS: NECK: No hypermetabolic lymph nodes in the neck. CHEST: Nonenlarged and weakly hypermetabolic left axillary lymph nodes may be inflammatory and related to the left scapular lesion. 2 cm left upper lobe lung lesion adjacent to the pericardium on image number 33 is hypermetabolic with SUV max of 9.6. This is likely a primary lung neoplasm. No definite metastatic pulmonary nodules. The centrilobular nodular appearance on the prior chest CT has improved and was more likely due to respiratory bronchiolitis or hypersensitivity pneumonitis. Single hypermetabolic prevascular lymph node on image number 63 measures 6.5 mm and SUV max is 4.22. ABDOMEN/PELVIS: No abnormal hypermetabolic activity within the liver, pancreas, adrenal glands, or spleen. No hypermetabolic lymph nodes in the abdomen or pelvis. SKELETON: Diffuse  osseous metastatic disease. This involves the axial and appendicular skeleton. There are bilateral scapular lesions, numerous thoracic and lumbar vertebral body lesions, rib lesions, pelvic bone lesions and left hip lesions. Areas of cortical destruction are noted notably involving the lesion in the left scapular body and the right iliac bone. No spinal canal compromise. Index lesion involving left scapula has an SUV max of 15.3. The left-sided T3 lesion has an SUV max of 11.7. Right iliac bone lesion has SUV max of 12.5 IMPRESSION: 1. Hypermetabolic left upper lobe lung lesion consistent with primary lung neoplasm. There is also a metabolically active left prevascular lymph node. 2. Diffuse lytic osseous metastatic disease. 3. No findings for pulmonary metastatic disease or abdominal/pelvic metastatic disease. Electronically Signed   By: Marijo Sanes M.D.   On: 05/11/2017 12:56    ASSESSMENT: This is a very pleasant 61 years old white male with highly suspicious stage IV (T1b, N2, M1c) lung cancer probably non-small cell carcinoma, pending tissue diagnosis.  He presented with left upper lobe pulmonary nodule in addition to left prevascular lymph node and multiple metastatic bone disease.   PLAN: I had a lengthy discussion with the patient and his family today about his current disease stage, prognosis and treatment options.  I personally and independently reviewed the imaging studies and discussed the results and showed the images to the patient and his family. His PSA is very low and this is unlikely to be metastatic prostate adenocarcinoma but cannot be completely ruled out at this [name]. I recommended for the patient to proceed with CT-guided core biopsy of one of the lytic lesions in the right pelvis.  Hopefully will be able to get a soft tissue component with this biopsy so we can consider the patient for molecular studies in the future. In the meantime I referred the patient to radiation oncology  for evaluation and consideration of palliative radiotherapy to the painful bone lesions. For [name] management, I started the patient on oxycodone 5 mg p.o. every 6 hours as needed for pain. The patient was seen during the multidisciplinary thoracic oncology clinic today by medical oncology, radiation oncology, thoracic navigator and physical therapist. I would see the patient back for follow-up visit in 2 weeks for evaluation and more detailed discussion of his treatment options after the availability of the biopsy. The patient was advised to call immediately if he has any concerning symptoms in the interval.  The patient voices understanding of current disease status and treatment options and is in agreement with the current care plan.  All questions were answered. The patient knows to call the clinic with any problems, questions or concerns. We can certainly see the patient much sooner if necessary.  Thank you so  much for allowing me to participate in the care of Tanner [name]. I will continue to follow up the patient with you and assist in his care.  I spent 40 minutes counseling the patient face to face. The total time spent in the appointment was 60 minutes.  Disclaimer: This note was dictated with voice recognition software. Similar sounding words can inadvertently be transcribed and may not be corrected upon review.   Eilleen Kempf May 24, 2017, 12:56 PM

## 2017-05-24 NOTE — Progress Notes (Signed)
Radiation Oncology         [phone] ________________________________  Name: Tanner [name]        MRN: [id number]  Date of Service: 05/24/2017 DOB: 09/08/1955  VO:[id number], Delphia Grates, NP  Deneise Lever, MD     REFERRING PHYSICIAN: Deneise Lever, MD   DIAGNOSIS: The encounter diagnosis was Lung mass.   HISTORY OF PRESENT ILLNESS: Tanner [name] is a 61 y.o. male seen at the request of Dr. Julien Nordmann for a new suspected stage IV lung cancer. The patient presented to the hospital with a syncopal episode and a mass in the left lung was noted. He underwent CTA of the chest on 05/03/17 revealing a 2 cm mass in the LUL. He also had some prevascular adenopathy, and a lesion in the left scapula. He had a brain MRI on 05/03/17 which was negative for metastatic disease. CT of the abdomen/pelvis and a PET scan. The PET revealed diffuse metastatic bone involvement particularly in bilateral scapula, T spine, L spine, and bilateral pelvic bones including the iliac wings. He comes to proceed with treatment, and has met with Dr. Julien Nordmann. He will proceed with systemic therapy once he undergoes biopsy.     PREVIOUS RADIATION THERAPY: No   PAST MEDICAL HISTORY:  Past Medical History:  Diagnosis Date  . Alcohol abuse   . Alcoholic hepatitis   . Allergy   . Anxiety   . 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 C. difficile colitis   . Depression   . DVT (deep venous thrombosis) ([facility]) 2015  . GERD (gastroesophageal reflux disease)   . Hypertension   . Ileus ([place]) 09/2013  . 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 Shortness of breath    new onset- occasionally-at rest and exertion  . Substance abuse ([place])   . TIA (transient ischemic attack) 08/2013       PAST SURGICAL HISTORY: Past Surgical History:  Procedure Laterality Date  . HAND SURGERY Right   . HERNIA REPAIR    . INTRAMEDULLARY (IM) NAIL INTERTROCHANTERIC Right 08/17/2016   Procedure: INTRAMEDULLARY (IM) NAIL RIGHT HIP;  Surgeon:  Tania Ade, MD;  Location: [facility];  Service: Orthopedics;  Laterality: Right;  . LYMPHADENECTOMY Bilateral 08/29/2013   Procedure: LYMPHADENECTOMY "BILATERAL PELVIC LYMPH NODE DISSECTION";  Surgeon: Molli Hazard, MD;  Location: [facility];  Service: Urology;  Laterality: Bilateral;  . ORIF ANKLE FRACTURE Right 12/10/2012   Procedure: OPEN REDUCTION INTERNAL FIXATION (ORIF) ANKLE FRACTURE;  Surgeon: Hessie Dibble, MD;  Location: [facility];  Service: Orthopedics;  Laterality: Right;  . ROBOT ASSISTED LAPAROSCOPIC RADICAL PROSTATECTOMY N/A 08/29/2013   Procedure: ROBOTIC ASSISTED LAPAROSCOPIC RADICAL PROSTATECTOMY LEVEL 2, Lysis of adhesions;  Surgeon: Molli Hazard, MD;  Location: [facility];  Service: Urology;  Laterality: N/A;     FAMILY HISTORY:  Family History  Problem Relation Age of Onset  . Lung cancer Father        smoker  . COPD Father   . Emphysema Father   . Arthritis Mother   . Pancreatic cancer Sister      SOCIAL HISTORY:  reports that he quit smoking about 2 months ago. His smoking use included cigarettes. He has a 45.00 pack-year smoking history. he has never used smokeless tobacco. He reports that he drinks about 3.0 - 6.0 oz of alcohol per week. He reports that he uses drugs. Drug: Marijuana. The patient is divorced and lives in [place]. He is  accompanied by his sister, daughter, and ex wife.   ALLERGIES: Dilaudid [hydromorphone hcl]; Oxycodone; Procaine hcl; Zofran [ondansetron]; and Sulfa antibiotics   MEDICATIONS:  Current Outpatient Medications  Medication Sig Dispense Refill  . amLODipine (NORVASC) 10 MG tablet Take 1 tablet (10 mg total) daily by mouth. 90 tablet 1  . bifidobacterium infantis (ALIGN) capsule Take 1 capsule daily by mouth.    . diazepam (VALIUM) 5 MG tablet Take 1 tablet (5 mg total) 2 (two) times daily as needed by mouth for anxiety or muscle spasms. 60 tablet 0  . FLUoxetine (PROZAC) 40 MG capsule TAKE 1 CAPSULE BY MOUTH EVERY DAY  90 capsule 1  . Glycopyrrolate-Formoterol (BEVESPI AEROSPHERE) 9-4.8 MCG/ACT AERO Inhale 2 puffs 2 (two) times daily into the lungs. 1 Inhaler 3  . lisinopril (PRINIVIL,ZESTRIL) 40 MG tablet TAKE ONE (1) TABLET BY MOUTH EVERY DAY 90 tablet 1  . loperamide (IMODIUM) 2 MG capsule Take 2 capsules (4 mg total) by mouth every 8 (eight) hours as needed for diarrhea or loose stools. Over the counter 30 capsule 0  . metoprolol tartrate (LOPRESSOR) 100 MG tablet Take 1 tablet (100 mg total) by mouth 2 (two) times daily. 180 tablet 1  . omeprazole (PRILOSEC) 40 MG capsule TAKE 1 CAPSULE BY MOUTH TWICE A DAY 120 capsule 0  . oxyCODONE-acetaminophen (PERCOCET/ROXICET) 5-325 MG tablet Take 1 tablet every 6 (six) hours as needed by mouth for severe pain. 30 tablet 0  . simvastatin (ZOCOR) 20 MG tablet TAKE ONE (1) TABLET BY MOUTH EVERY DAY 90 tablet 1  . thiamine 100 MG tablet Take 1 tablet (100 mg total) by mouth daily. 30 tablet 3  . traZODone (DESYREL) 50 MG tablet Take 1 tablet (50 mg total) by mouth at bedtime as needed. for sleep 30 tablet 0   No current facility-administered medications for this encounter.      REVIEW OF SYSTEMS: On review of systems, the patient reports that he is doing well overall. He continues to have pain in the bilateral hips and bilateral scapulae and low back pain. He reports some fecal urgency but denies any loss of control of bowel or bladder function. He denies any chest pain, shortness of breath, cough, fevers, chills, night sweats. He denies any  abdominal pain, nausea or vomiting. He denies any new musculoskeletal or joint aches or pains. A complete review of systems is obtained and is otherwise negative.     PHYSICAL EXAM:  Wt Readings from Last 3 Encounters:  05/24/17 165 lb 6.4 oz (75 kg)  05/24/17 165 lb (74.8 kg)  05/15/17 165 lb 2 oz (74.9 kg)   Temp Readings from Last 3 Encounters:  05/24/17 97.9 F (36.6 C) (Oral)  05/24/17 97.9 F (36.6 C) (Oral)    05/08/17 98.1 F (36.7 C) (Oral)   BP Readings from Last 3 Encounters:  05/24/17 118/73  05/24/17 118/73  05/24/17 100/68   Pulse Readings from Last 3 Encounters:  05/24/17 71  05/24/17 71  05/24/17 78     In general this is a well appearing caucasian male in no acute distress. He is alert and oriented x4 and appropriate throughout the examination. HEENT reveals that the patient is normocephalic, atraumatic. EOMs are intact. PERRLA. Skin is intact without any evidence of gross lesions. Cardiovascular exam reveals a regular rate and rhythm, no clicks rubs or murmurs are auscultated. Chest is clear to auscultation bilaterally. Lymphatic assessment is performed and does not reveal any adenopathy in the cervical, supraclavicular, axillary,  or inguinal chains. Abdomen has active bowel sounds in all quadrants and is intact. The abdomen is soft, non tender, non distended. Lower extremities are negative for pretibial pitting edema, deep calf tenderness, cyanosis or clubbing. He has 5/5 strength in bilateral lower extremities and intact sensation to light touch equally. He is limited by pain in the right hip.   ECOG = 1  0 - Asymptomatic (Fully active, able to carry on all predisease activities without restriction)  1 - Symptomatic but completely ambulatory (Restricted in physically strenuous activity but ambulatory and able to carry out work of a light or sedentary nature. For example, light housework, office work)  2 - Symptomatic, <50% in bed during the day (Ambulatory and capable of all self care but unable to carry out any work activities. Up and about more than 50% of waking hours)  3 - Symptomatic, >50% in bed, but not bedbound (Capable of only limited self-care, confined to bed or chair 50% or more of waking hours)  4 - Bedbound (Completely disabled. Cannot carry on any self-care. Totally confined to bed or chair)  5 - Death   Eustace Pen MM, Creech RH, Tormey DC, et al. [phone]). "Toxicity  and response criteria of the [facility]". [place] Oncol. 5 (6): 649-55    LABORATORY DATA:  Lab Results  Component Value Date   WBC 7.3 05/24/2017   HGB 12.5 (L) 05/24/2017   HCT 37.5 (L) 05/24/2017   MCV 100.3 (H) 05/24/2017   PLT 234 05/24/2017   Lab Results  Component Value Date   NA 134 (L) 05/24/2017   K 4.2 05/24/2017   CL 98 05/08/2017   CO2 25 05/24/2017   Lab Results  Component Value Date   ALT 11 05/24/2017   AST 19 05/24/2017   ALKPHOS 121 05/24/2017   BILITOT 0.23 05/24/2017      RADIOGRAPHY: Dg Chest 2 View  Result Date: 05/02/2017 CLINICAL DATA:  61 year old male with syncope and chest tightness. EXAM: CHEST  2 VIEW COMPARISON:  Chest radiograph dated 08/16/2016 and chest CT dated 07/23/2015 FINDINGS: The lungs are clear. There is no pleural effusion or pneumothorax. The cardiac silhouette is within normal limits. Appy there is osteopenia with degenerative changes of the spine. Old midthoracic compression deformity similar to the CT of 07/23/2015 none no acute osseous pathology. IMPRESSION: No active cardiopulmonary disease. Electronically Signed   By: Anner Crete M.D.   On: 05/02/2017 23:43   Ct Angio Chest Pe W And/or Wo Contrast  Result Date: 05/03/2017 CLINICAL DATA:  Acute onset of shortness of breath. Initial encounter. EXAM: CT ANGIOGRAPHY CHEST WITH CONTRAST TECHNIQUE: Multidetector CT imaging of the chest was performed using the standard protocol during bolus administration of intravenous contrast. Multiplanar CT image reconstructions and MIPs were obtained to evaluate the vascular anatomy. There was partial extravasation of 40 mL of contrast into the patient's right antecubital fossa; the extravasation site was evaluated by the radiologist. An additional peripheral IV catheter could not be successfully placed. CONTRAST:  60 mL of Isovue 370 IV contrast COMPARISON:  Chest radiograph performed 05/02/2017, and CTA of the  chest performed 07/23/2015 FINDINGS: Cardiovascular: There is no evidence of central pulmonary embolus. Evaluation for pulmonary embolus is suboptimal due to partial extravasation of the contrast bolus. The heart is normal in size. The thoracic aorta demonstrates minimal calcification. Mild calcification is seen along the proximal great vessels. Mediastinum/Nodes: There is a spiculated left upper lobe pulmonary nodule abutting the anterior aspect  of the mediastinum, measuring approximately 2.2 x 2.1 x 1.4 cm and concerning for malignancy. This is new from 2017. The mediastinum is otherwise unremarkable. No mediastinal lymphadenopathy is seen. No pericardial effusion is identified. The visualized portions of the thyroid gland are unremarkable. No axillary lymphadenopathy is seen. Lungs/Pleura: Scattered nodular opacities are seen throughout both lungs. There is suggestion of a miliary pattern, though some of the opacities are more diffuse. This may reflect atypical pneumonia. Fungal pneumonia or viral pneumonitis cannot be excluded. Tuberculosis is considered less likely. No pleural effusion or pneumothorax is seen. Upper Abdomen: The visualized portions of the liver and spleen are unremarkable. Musculoskeletal: No acute osseous abnormalities are identified. There is question of lytic lesions within vertebral bodies T1 and T2, apparently new from 2017. There is chronic loss of height at vertebral bodies T8 and T9. The visualized musculature is unremarkable in appearance. Review of the MIP images confirms the above findings. IMPRESSION: 1. No evidence of central pulmonary embolus. 2. Scattered nodular opacities throughout both lungs. Suggestion of a miliary pattern, though some of the opacities are more diffuse. This is concerning for atypical pneumonia. Fungal pneumonia or viral pneumonitis cannot be excluded. Tuberculosis is considered less likely. Would correlate with the patient's symptoms. 3. Spiculated left  upper lobe pulmonary nodule abutting the anterior aspect of the mediastinum, new from 2017. This measures 2.2 x 2.1 x 1.4 cm, and is concerning for primary bronchogenic malignancy. Tissue diagnosis and/or PET/CT would be helpful for further evaluation. 4. Question of lytic lesions within vertebral bodies T1 and T2, apparently new from 2017. This could be further assessed on bone scan or the PET/CT, as deemed clinically appropriate. Electronically Signed   By: Garald Balding M.D.   On: 05/03/2017 02:08   Mr Jeri Cos TS Contrast  Result Date: 05/03/2017 CLINICAL DATA:  Syncopal episode on way to bathroom. No head injury. Feeling unwell for 4 days. History of LEFT lung mass, alcohol abuse, hypertension, hyperlipidemia. EXAM: MRI HEAD WITHOUT AND WITH CONTRAST TECHNIQUE: Multiplanar, multiecho pulse sequences of the brain and surrounding structures were obtained without and with intravenous contrast. CONTRAST:  11m MULTIHANCE GADOBENATE DIMEGLUMINE 529 MG/ML IV SOLN COMPARISON:  CT HEAD August 16, 2016 FINDINGS: BRAIN: No reduced diffusion to suggest acute ischemia or hypercellular tumor. No susceptibility artifact to suggest hemorrhage. Patchy supratentorial white matter FLAIR T2 hyperintensities. Mild ex vacuo dilatation LEFT lateral ventricle, ventricles and sulci are overall mildly prominent for patient's age. LEFT midbrain prominent perivascular space, normal variant. No suspicious parenchymal signal, mass or mass effect. No abnormal intraparenchymal or extra-axial enhancement. No abnormal extra-axial fluid collections. VASCULAR: Normal major intracranial vascular flow voids present at skull base. SKULL AND UPPER CERVICAL SPINE: No abnormal sellar expansion. No suspicious calvarial bone marrow signal. Craniocervical junction maintained. SINUSES/ORBITS: The mastoid air-cells and included paranasal sinuses are well-aerated. The included ocular globes and orbital contents are non-suspicious. OTHER: None.  IMPRESSION: 1. No acute intracranial process or metastasis. 2. Mild parenchymal brain volume loss for age. 3. Mild chronic small vessel ischemic disease. Electronically Signed   By: CElon AlasM.D.   On: 05/03/2017 23:56   Mr Thoracic Spine W Wo Contrast  Result Date: 05/04/2017 CLINICAL DATA:  Syncopal episode on way to bathroom. No head injury. Feeling unwell for 4 days. History of LEFT lung mass, alcohol abuse, hypertension, hyperlipidemia. EXAM: MRI THORACIC WITHOUT AND WITH CONTRAST TECHNIQUE: Multiplanar and multiecho pulse sequences of the thoracic spine were obtained without and with intravenous contrast. CONTRAST:  34m MULTIHANCE GADOBENATE DIMEGLUMINE 529 MG/ML IV SOLN COMPARISON:  CT chest May 03, 2017 at 0117 hours FINDINGS: Mildly motion degraded examination. ALIGNMENT: Maintenance of the thoracic kyphosis. No malalignment. VERTEBRAE/DISCS: Vertebral bodies are intact. Multiple low T1, I bright T2 osseous metastasis, predominately enhancing metastasis stoma which demonstrate central necrosis at T2, T4, T7, T9, T10, T12, L1 and L2. No pathologic fracture. Old mild T3, T4, moderate to severe T8 compression fracture. Disc desiccation all levels with multilevel mid to lower thoracic moderate chronic discogenic endplate changes. CORD: Thoracic spinal cord is normal morphology and signal characteristics to the level of the conus medullaris which terminates at T12-L1. Axial sequences are mildly motion degraded, multiple tiny foci T1 shortening most compatible with flow void without convincing evidence of metastasis. PREVERTEBRAL AND PARASPINAL SOFT TISSUES: Faint enhancement RIGHT T4-5 inner costal space of equivocal for metastasis (sagittal 1/17) DISC LEVELS: Multilevel small broad-based disc bulges without canal stenosis or neural foraminal narrowing at any level. IMPRESSION: 1. 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 2. Old mild T3, old mild T4 and moderate to  severe old T8 compression fractures. 3. No significant canal stenosis or neural foraminal narrowing at any level. Electronically Signed   By: CElon AlasM.D.   On: 05/04/2017 00:10   Ct Abdomen Pelvis W Contrast  Result Date: 05/04/2017 CLINICAL DATA:  Unintended weight loss, abdominal pain. New lung mass. EXAM: CT ABDOMEN AND PELVIS WITH CONTRAST TECHNIQUE: Multidetector CT imaging of the abdomen and pelvis was performed using the standard protocol following bolus administration of intravenous contrast. CONTRAST:  100 cc Isovue 300 IV COMPARISON:  Chest CT 05/03/2017. FINDINGS: Lower chest: Are lung bases clear.  No effusions. Hepatobiliary: Suspect mild diffuse fatty infiltration of the liver. Area of focal scratched at area of more focal fatty infiltration near the falciform ligament. Gallbladder unremarkable. Pancreas: No focal abnormality or ductal dilatation. Spleen: No focal abnormality.  Normal size. Adrenals/Urinary Tract: Small bilateral renal cysts. No hydronephrosis. Adrenal glands and urinary bladder unremarkable. Stomach/Bowel: Sigmoid diverticulosis. No active diverticulitis. Stomach and small bowel decompressed, unremarkable. Vascular/Lymphatic: Aortic and iliac calcifications. No evidence of aneurysm or adenopathy. Reproductive: No visible focal abnormality. Other: No free fluid or free air. Small umbilical and supraumbilical hernias containing fat. Musculoskeletal: No acute bony abnormality. IMPRESSION: Suspect mild fatty infiltration of the liver. Small bilateral renal cysts. Aortoiliac atherosclerosis. Sigmoid diverticulosis. No acute findings. Electronically Signed   By: KRolm BaptiseM.D.   On: 05/04/2017 12:07   Nm Pet Image Initial (pi) Skull Base To Thigh  Result Date: 05/11/2017 CLINICAL DATA:  Initial treatment strategy for left upper lobe lung lesion. EXAM: NUCLEAR MEDICINE PET SKULL BASE TO THIGH TECHNIQUE: 8.1 mCi F-18 FDG was injected intravenously. Full-ring PET imaging  was performed from the skull base to thigh after the radiotracer. CT data was obtained and used for attenuation correction and anatomic localization. FASTING BLOOD GLUCOSE:  Value: 95 mg/dl COMPARISON:  Chest CT 05/03/2017 FINDINGS: NECK: No hypermetabolic lymph nodes in the neck. CHEST: Nonenlarged and weakly hypermetabolic left axillary lymph nodes may be inflammatory and related to the left scapular lesion. 2 cm left upper lobe lung lesion adjacent to the pericardium on image number 33 is hypermetabolic with SUV max of 9.6. This is likely a primary lung neoplasm. No definite metastatic pulmonary nodules. The centrilobular nodular appearance on the prior chest CT has improved and was more likely due to respiratory bronchiolitis or hypersensitivity pneumonitis. Single hypermetabolic prevascular lymph node on image number 63 measures  6.5 mm and SUV max is 4.22. ABDOMEN/PELVIS: No abnormal hypermetabolic activity within the liver, pancreas, adrenal glands, or spleen. No hypermetabolic lymph nodes in the abdomen or pelvis. SKELETON: Diffuse osseous metastatic disease. This involves the axial and appendicular skeleton. There are bilateral scapular lesions, numerous thoracic and lumbar vertebral body lesions, rib lesions, pelvic bone lesions and left hip lesions. Areas of cortical destruction are noted notably involving the lesion in the left scapular body and the right iliac bone. No spinal canal compromise. Index lesion involving left scapula has an SUV max of 15.3. The left-sided T3 lesion has an SUV max of 11.7. Right iliac bone lesion has SUV max of 12.5 IMPRESSION: 1. Hypermetabolic left upper lobe lung lesion consistent with primary lung neoplasm. There is also a metabolically active left prevascular lymph node. 2. Diffuse lytic osseous metastatic disease. 3. No findings for pulmonary metastatic disease or abdominal/pelvic metastatic disease. Electronically Signed   By: Marijo Sanes M.D.   On: 05/11/2017 12:56        IMPRESSION/PLAN: 1. Probable Stage IV Lung Cancer originating in the LUL. Dr. Lisbeth Renshaw discusses the radiographic findings and reviews the options for treatment to the sites of bony involvement where the patient is symptomatic. He appears to be neurologically intact, and we would anticipate his biopsy being performed in the next week or so. We would offer a course of palliative radiotherapy to the bilateral hips/pelvis, bilateral scapulae, and  L4-5. We discussed the risks, benefits, short, and long term effects of radiotherapy, and the patient is interested in proceeding. Dr. Lisbeth Renshaw discusses the delivery and logistics of radiotherapy and anticipates a course of 2 weeks. The patient will be set up for simulation once we know the date of his biopsy. We will sign consent when he returns for simulation.  The above documentation reflects my direct findings during this shared patient visit. Please see the separate note by Dr. Lisbeth Renshaw on this date for the remainder of the patient's plan of care.    Carola Rhine, PAC

## 2017-05-24 NOTE — Patient Instructions (Signed)
Take Imodium, 1 tablet by mouth very morning when you get up then up to 4 per day as diarrhea.  Take Align, one capsule by mouth every morning. Stop the Multivitamin.   Call next week with an update. Ask for Amy's nurse, Coleman, , call [phone] , choose option 2 and ask for Beth or Yitzchak Kothari.

## 2017-05-24 NOTE — Telephone Encounter (Signed)
Printed avs and calender for upcoming appointment. Per 11/15 los 

## 2017-05-28 ENCOUNTER — Telehealth: Payer: Self-pay | Admitting: Radiation Oncology

## 2017-05-28 NOTE — Telephone Encounter (Signed)
I spoke with the patient and he will simulate on Wednesday and his biopsy is scheduled for Friday. We will plan to start treatment on 06/04/17.

## 2017-05-29 ENCOUNTER — Other Ambulatory Visit: Payer: Self-pay | Admitting: Radiology

## 2017-05-29 ENCOUNTER — Telehealth: Payer: Self-pay | Admitting: *Deleted

## 2017-05-29 NOTE — Telephone Encounter (Signed)
Oncology Nurse Navigator Documentation  Oncology Nurse Navigator Flowsheets 05/29/2017  Navigator Location [facility]  Navigator Encounter Type Telephone/I called to follow up to see how Tanner [name] was doing. I listened as he explained. I updated him on his schedule for SIM and biopsy.  He was thankful for the call and update.   Telephone Outgoing Call  Treatment Initiated Date 05/30/2017  Treatment Phase Pre-Tx/Tx Discussion  Barriers/Navigation Needs Education  Education Other  Interventions Education  Education Method Verbal  Acuity Level 1  Time Spent with Patient 15

## 2017-05-30 ENCOUNTER — Other Ambulatory Visit: Payer: Self-pay | Admitting: Student

## 2017-05-30 ENCOUNTER — Ambulatory Visit
Admission: RE | Admit: 2017-05-30 | Discharge: 2017-05-30 | Disposition: A | Discharge status: Home / Self Care | Payer: Medicare HMO | Source: Ambulatory Visit | Attending: Radiation Oncology | Admitting: Radiation Oncology

## 2017-05-30 ENCOUNTER — Other Ambulatory Visit: Payer: Self-pay | Admitting: Radiology

## 2017-05-30 DIAGNOSIS — M25552 Pain in left hip: Secondary | ICD-10-CM | POA: Diagnosis not present

## 2017-05-30 DIAGNOSIS — R918 Other nonspecific abnormal finding of lung field: Secondary | ICD-10-CM | POA: Diagnosis not present

## 2017-05-30 DIAGNOSIS — Z825 Family history of asthma and other chronic lower respiratory diseases: Secondary | ICD-10-CM | POA: Diagnosis not present

## 2017-05-30 DIAGNOSIS — F419 Anxiety disorder, unspecified: Secondary | ICD-10-CM | POA: Diagnosis not present

## 2017-05-30 DIAGNOSIS — K219 Gastro-esophageal reflux disease without esophagitis: Secondary | ICD-10-CM | POA: Diagnosis not present

## 2017-05-30 DIAGNOSIS — Z800 Family history of malignant neoplasm of digestive organs: Secondary | ICD-10-CM | POA: Diagnosis not present

## 2017-05-30 DIAGNOSIS — Z882 Allergy status to sulfonamides status: Secondary | ICD-10-CM | POA: Diagnosis not present

## 2017-05-30 DIAGNOSIS — C7951 Secondary malignant neoplasm of bone: Secondary | ICD-10-CM

## 2017-05-30 DIAGNOSIS — M898X1 Other specified disorders of bone, shoulder: Secondary | ICD-10-CM | POA: Diagnosis not present

## 2017-05-30 DIAGNOSIS — Z8546 Personal history of malignant neoplasm of prostate: Secondary | ICD-10-CM | POA: Diagnosis not present

## 2017-05-30 DIAGNOSIS — Z888 Allergy status to other drugs, medicaments and biological substances status: Secondary | ICD-10-CM | POA: Diagnosis not present

## 2017-05-30 DIAGNOSIS — Z87891 Personal history of nicotine dependence: Secondary | ICD-10-CM | POA: Diagnosis not present

## 2017-05-30 DIAGNOSIS — Z885 Allergy status to narcotic agent status: Secondary | ICD-10-CM | POA: Diagnosis not present

## 2017-05-30 DIAGNOSIS — M25551 Pain in right hip: Secondary | ICD-10-CM | POA: Diagnosis not present

## 2017-05-30 DIAGNOSIS — Z8673 Personal history of transient ischemic attack (TIA), and cerebral infarction without residual deficits: Secondary | ICD-10-CM | POA: Diagnosis not present

## 2017-05-30 DIAGNOSIS — Z510 Encounter for antineoplastic radiation therapy: Secondary | ICD-10-CM | POA: Diagnosis present

## 2017-05-30 DIAGNOSIS — Z86718 Personal history of other venous thrombosis and embolism: Secondary | ICD-10-CM | POA: Diagnosis not present

## 2017-05-30 DIAGNOSIS — M545 Low back pain: Secondary | ICD-10-CM | POA: Diagnosis not present

## 2017-05-30 DIAGNOSIS — R599 Enlarged lymph nodes, unspecified: Secondary | ICD-10-CM | POA: Diagnosis not present

## 2017-05-30 DIAGNOSIS — Z801 Family history of malignant neoplasm of trachea, bronchus and lung: Secondary | ICD-10-CM | POA: Diagnosis not present

## 2017-05-30 DIAGNOSIS — F329 Major depressive disorder, single episode, unspecified: Secondary | ICD-10-CM | POA: Diagnosis not present

## 2017-05-30 DIAGNOSIS — Z8261 Family history of arthritis: Secondary | ICD-10-CM | POA: Diagnosis not present

## 2017-05-30 DIAGNOSIS — I10 Essential (primary) hypertension: Secondary | ICD-10-CM | POA: Diagnosis not present

## 2017-05-30 DIAGNOSIS — C801 Malignant (primary) neoplasm, unspecified: Secondary | ICD-10-CM | POA: Diagnosis not present

## 2017-06-01 ENCOUNTER — Encounter: Payer: Self-pay | Admitting: *Deleted

## 2017-06-01 ENCOUNTER — Encounter (HOSPITAL_COMMUNITY): Payer: Self-pay

## 2017-06-01 ENCOUNTER — Ambulatory Visit (HOSPITAL_COMMUNITY)
Admission: RE | Admit: 2017-06-01 | Discharge: 2017-06-01 | Disposition: A | Discharge status: Home / Self Care | Payer: Medicare HMO | Source: Ambulatory Visit | Attending: Internal Medicine | Admitting: Internal Medicine

## 2017-06-01 DIAGNOSIS — Z885 Allergy status to narcotic agent status: Secondary | ICD-10-CM | POA: Insufficient documentation

## 2017-06-01 DIAGNOSIS — R918 Other nonspecific abnormal finding of lung field: Secondary | ICD-10-CM | POA: Diagnosis present

## 2017-06-01 DIAGNOSIS — C7951 Secondary malignant neoplasm of bone: Secondary | ICD-10-CM

## 2017-06-01 DIAGNOSIS — Z79899 Other long term (current) drug therapy: Secondary | ICD-10-CM | POA: Diagnosis not present

## 2017-06-01 DIAGNOSIS — Z87891 Personal history of nicotine dependence: Secondary | ICD-10-CM | POA: Diagnosis not present

## 2017-06-01 DIAGNOSIS — Z8546 Personal history of malignant neoplasm of prostate: Secondary | ICD-10-CM | POA: Insufficient documentation

## 2017-06-01 DIAGNOSIS — Z882 Allergy status to sulfonamides status: Secondary | ICD-10-CM | POA: Insufficient documentation

## 2017-06-01 DIAGNOSIS — D492 Neoplasm of unspecified behavior of bone, soft tissue, and skin: Secondary | ICD-10-CM | POA: Diagnosis not present

## 2017-06-01 DIAGNOSIS — M899 Disorder of bone, unspecified: Secondary | ICD-10-CM | POA: Diagnosis not present

## 2017-06-01 DIAGNOSIS — I10 Essential (primary) hypertension: Secondary | ICD-10-CM | POA: Diagnosis not present

## 2017-06-01 LAB — PROTIME-INR
INR: 0.97
Prothrombin Time: 12.7 seconds (ref 11.4–15.2)

## 2017-06-01 LAB — CBC
HEMATOCRIT: 37.4 % — AB (ref 39.0–52.0)
HEMOGLOBIN: 12.5 g/dL — AB (ref 13.0–17.0)
MCH: 33.2 pg (ref 26.0–34.0)
MCHC: 33.4 g/dL (ref 30.0–36.0)
MCV: 99.2 fL (ref 78.0–100.0)
PLATELETS: 266 10*3/uL (ref 150–400)
RBC: 3.77 MIL/uL — AB (ref 4.22–5.81)
RDW: 13.1 % (ref 11.5–15.5)
WBC: 8.9 10*3/uL (ref 4.0–10.5)

## 2017-06-01 LAB — APTT: APTT: 32 s (ref 24–36)

## 2017-06-01 MED ORDER — FENTANYL CITRATE (PF) 100 MCG/2ML IJ SOLN
INTRAMUSCULAR | Status: AC | PRN
  Administered 2017-06-01 (×2): 25 ug via INTRAVENOUS
  Administered 2017-06-01: 50 ug via INTRAVENOUS

## 2017-06-01 MED ORDER — LIDOCAINE-EPINEPHRINE 1 %-1:100000 IJ SOLN
INTRAMUSCULAR | Status: AC
  Filled 2017-06-01: qty 1

## 2017-06-01 MED ORDER — MIDAZOLAM HCL 2 MG/2ML IJ SOLN
INTRAMUSCULAR | Status: AC | PRN
  Administered 2017-06-01 (×2): 1 mg via INTRAVENOUS

## 2017-06-01 MED ORDER — OXYCODONE HCL 5 MG PO TABS: 5.0000 mg | ORAL_TABLET | ORAL | Status: DC | PRN

## 2017-06-01 MED ORDER — FENTANYL CITRATE (PF) 100 MCG/2ML IJ SOLN
INTRAMUSCULAR | Status: AC
  Filled 2017-06-01: qty 4

## 2017-06-01 MED ORDER — SODIUM CHLORIDE 0.9 % IV SOLN: INTRAVENOUS | Status: DC

## 2017-06-01 MED ORDER — MIDAZOLAM HCL 2 MG/2ML IJ SOLN
INTRAMUSCULAR | Status: AC
  Filled 2017-06-01: qty 4

## 2017-06-01 NOTE — Discharge Instructions (Addendum)
Needle Biopsy, Care After Refer to this sheet in the next few weeks. These instructions provide you with information about caring for yourself after your procedure. Your health care provider may also give you more specific instructions. Your treatment has been planned according to current medical practices, but problems sometimes occur. Call your health care provider if you have any problems or questions after your procedure. What can I expect after the procedure? After your procedure, it is common to have soreness, bruising, or mild pain at the biopsy site. This should go away in a few days. Follow these instructions at home:  Rest as directed by your health care provider.  Take medicines only as directed by your health care provider.  There are many different ways to close and cover the biopsy site, including stitches (sutures), skin glue, and adhesive strips. Follow your health care provider's instructions about: ? Biopsy site care. ? Bandage (dressing) changes and removal. ? Biopsy site closure removal.  Check your biopsy site every day for signs of infection. Watch for: ? Redness, swelling, or pain. ? Fluid, blood, or pus. Contact a health care provider if:  You have a fever.  You have redness, swelling, or pain at the biopsy site that lasts longer than a few days.  You have fluid, blood, or pus coming from the biopsy site.  You feel nauseous.  You vomit. Get help right away if:  You have shortness of breath.  You have trouble breathing.  You have chest pain.  You feel dizzy or you faint.  You have bleeding that does not stop with pressure or a bandage.  You cough up blood.  You have pain in your abdomen. This information is not intended to replace advice given to you by your health care provider. Make sure you discuss any questions you have with your health care provider. Document Released: 11/10/2014 Document Revised: 12/02/2015 Document Reviewed:  06/22/2014 Elsevier Interactive Patient Education  2018 [employer].   Moderate Conscious Sedation, Adult, Care After These instructions provide you with information about caring for yourself after your procedure. Your health care provider may also give you more specific instructions. Your treatment has been planned according to current medical practices, but problems sometimes occur. Call your health care provider if you have any problems or questions after your procedure. What can I expect after the procedure? After your procedure, it is common:  To feel sleepy for several hours.  To feel clumsy and have poor balance for several hours.  To have poor judgment for several hours.  To vomit if you eat too soon.  Follow these instructions at home: For at least 24 hours after the procedure:   Do not: ? Participate in activities where you could fall or become injured. ? Drive. ? Use heavy machinery. ? Drink alcohol. ? Take sleeping pills or medicines that cause drowsiness. ? Make important decisions or sign legal documents. ? Take care of children on your own.  Rest. Eating and drinking  Follow the diet recommended by your health care provider.  If you vomit: ? Drink water, juice, or soup when you can drink without vomiting. ? Make sure you have little or no nausea before eating solid foods. General instructions  Have a responsible adult stay with you until you are awake and alert.  Take over-the-counter and prescription medicines only as told by your health care provider.  If you smoke, do not smoke without supervision.  Keep all follow-up visits as told by your  health care provider. This is important. Contact a health care provider if:  You keep feeling nauseous or you keep vomiting.  You feel light-headed.  You develop a rash.  You have a fever. Get help right away if:  You have trouble breathing. This information is not intended to replace advice given to  you by your health care provider. Make sure you discuss any questions you have with your health care provider. Document Released: 04/16/2013 Document Revised: 11/29/2015 Document Reviewed: 10/16/2015 Elsevier Interactive Patient Education  [employer].

## 2017-06-01 NOTE — Progress Notes (Signed)
Oncology Nurse Navigator Documentation  Oncology Nurse Navigator Flowsheets 06/01/2017  Navigator Location [facility]  Navigator Encounter Type Other/I followed up on Tanner [name]'s schedule.  He needs labs before his appt with Dr. Julien Nordmann on 11/27.  I notified scheduling team to schedule.   Treatment Phase Pre-Tx/Tx Discussion  Barriers/Navigation Needs Coordination of Care  Interventions Coordination of Care  Coordination of Care Other  Acuity Level 1  Time Spent with Patient 15

## 2017-06-01 NOTE — Sedation Documentation (Signed)
Vital signs stable. 

## 2017-06-01 NOTE — Sedation Documentation (Signed)
Patient is resting comfortably. States pain is somewhat better.

## 2017-06-01 NOTE — Sedation Documentation (Signed)
Patient is resting

## 2017-06-01 NOTE — H&P (Signed)
Chief Complaint: Patient was seen in consultation today for right pelvic bone lesion biopsy at the request of [facility]  Referring Physician(s): Mohamed,Mohamed  Supervising Physician: Markus Daft  Patient Status: [facility] - Out-pt  History of Present Illness: Tanner [name] is a 61 y.o. male   Hx prostate Ca 2014 Etoh hepatitis Smoker (quit 02/2017) Admitted to [facility] 04/2017 for N/V; weakness; dehydration Work up revealed LUL lesion CT: IMPRESSION: 1. No evidence of central pulmonary embolus. 2. Scattered nodular opacities throughout both lungs. Suggestion of a miliary pattern, though some of the opacities are more diffuse. This is concerning for atypical pneumonia. Fungal pneumonia or viral pneumonitis cannot be excluded. Tuberculosis is considered less likely. Would correlate with the patient's symptoms. 3. Spiculated left upper lobe pulmonary nodule abutting the anterior aspect of the mediastinum, new from 2017. This measures 2.2 x 2.1 x 1.4 cm, and is concerning for primary bronchogenic malignancy. Tissue diagnosis and/or PET/CT would be helpful for further evaluation. 4. Question of lytic lesions within vertebral bodies T1 and T2, apparently new from 2017. This could be further assessed on bone scan or the PET/CT, as deemed clinically appropriate.  PET 05/11/17: IMPRESSION: 1. Hypermetabolic left upper lobe lung lesion consistent with primary lung neoplasm. There is also a metabolically active left prevascular lymph node. 2. Diffuse lytic osseous metastatic disease. 3. No findings for pulmonary metastatic disease or abdominal/pelvic metastatic disease.  Scheduled for right pelvic bone lesion biopsy  Past Medical History:  Diagnosis Date  . Alcohol abuse   . Alcoholic hepatitis   . Allergy   . Anxiety   . 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 C. difficile colitis   . Depression   . DVT (deep venous thrombosis) ([place]) 2015  . GERD (gastroesophageal reflux  disease)   . Hypertension   . Ileus ([place]) 09/2013  . 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 Shortness of breath    new onset- occasionally-at rest and exertion  . Substance abuse ([place])   . TIA (transient ischemic attack) 08/2013    Past Surgical History:  Procedure Laterality Date  . HAND SURGERY Right   . HERNIA REPAIR    . INTRAMEDULLARY (IM) NAIL INTERTROCHANTERIC Right 08/17/2016   Procedure: INTRAMEDULLARY (IM) NAIL RIGHT HIP;  Surgeon: Tania Ade, MD;  Location: [place];  Service: Orthopedics;  Laterality: Right;  . LYMPHADENECTOMY Bilateral 08/29/2013   Procedure: LYMPHADENECTOMY "BILATERAL PELVIC LYMPH NODE DISSECTION";  Surgeon: Molli Hazard, MD;  Location: [facility];  Service: Urology;  Laterality: Bilateral;  . ORIF ANKLE FRACTURE Right 12/10/2012   Procedure: OPEN REDUCTION INTERNAL FIXATION (ORIF) ANKLE FRACTURE;  Surgeon: Hessie Dibble, MD;  Location: [facility];  Service: Orthopedics;  Laterality: Right;  . ROBOT ASSISTED LAPAROSCOPIC RADICAL PROSTATECTOMY N/A 08/29/2013   Procedure: ROBOTIC ASSISTED LAPAROSCOPIC RADICAL PROSTATECTOMY LEVEL 2, Lysis of adhesions;  Surgeon: Molli Hazard, MD;  Location: [facility];  Service: Urology;  Laterality: N/A;    Allergies: Dilaudid [hydromorphone hcl]; Procaine hcl; Zofran [ondansetron]; and Sulfa antibiotics  Medications: Prior to Admission medications   Medication Sig Start Date End Date Taking? Authorizing Provider  amLODipine (NORVASC) 10 MG tablet Take 1 tablet (10 mg total) daily by mouth. 05/17/17  Yes Shambley, Delphia Grates, NP  diazepam (VALIUM) 5 MG tablet Take 1 tablet (5 mg total) 2 (two) times daily as needed by mouth for anxiety or muscle spasms. 05/21/17 05/21/18 Yes [name], NP  FLUoxetine (PROZAC) 40 MG  capsule TAKE 1 CAPSULE BY MOUTH EVERY DAY 05/21/17  Yes Shambley, Delphia Grates, NP  lisinopril (PRINIVIL,ZESTRIL) 40 MG tablet TAKE ONE (1) TABLET BY MOUTH EVERY DAY 05/21/17  Yes  [name], NP  loperamide (IMODIUM) 2 MG capsule Take 2 capsules (4 mg total) by mouth every 8 (eight) hours as needed for diarrhea or loose stools. Over the counter 05/05/17  Yes Rai, Ripudeep K, MD  metoprolol tartrate (LOPRESSOR) 100 MG tablet Take 1 tablet (100 mg total) by mouth 2 (two) times daily. 05/11/17  Yes [name], NP  omeprazole (PRILOSEC) 40 MG capsule TAKE 1 CAPSULE BY MOUTH TWICE A DAY 04/13/17  Yes [name], [name]  oxyCODONE (OXY IR/ROXICODONE) 5 MG immediate release tablet Take 1 tablet (5 mg total) every 6 (six) hours as needed by mouth for severe pain. 05/24/17  Yes Curt Bears, MD  simvastatin (ZOCOR) 20 MG tablet TAKE ONE (1) TABLET BY MOUTH EVERY DAY 03/14/17  Yes [name], [name]  thiamine 100 MG tablet Take 1 tablet (100 mg total) by mouth daily. 05/05/17  Yes Rai, Ripudeep K, MD  traZODone (DESYREL) 50 MG tablet Take 1 tablet (50 mg total) by mouth at bedtime as needed. for sleep 05/11/17  Yes [name], NP  bifidobacterium infantis (ALIGN) capsule Take 1 capsule daily by mouth.    [provider]  Glycopyrrolate-Formoterol (BEVESPI AEROSPHERE) 9-4.8 MCG/ACT AERO Inhale 2 puffs 2 (two) times daily into the lungs. Patient not taking: Reported on 05/25/2017 05/15/17   Parrett, Fonnie Mu, NP     Family History  Problem Relation Age of Onset  . Lung cancer Father        smoker  . COPD Father   . Emphysema Father   . Arthritis Mother   . Pancreatic cancer Sister     Social History   Socioeconomic History  . Marital status: Divorced    Spouse name: None  . Number of children: 1  . Years of education: 9  . Highest education level: None  Social Needs  . Financial resource strain: None  . Food insecurity - worry: None  . Food insecurity - inability: None  . Transportation needs - medical: None  . Transportation needs - non-medical: None  Occupational History  . Occupation: disabled    Comment: Fingers,  scolisosis,   Tobacco Use  . Smoking status: Former Smoker    Packs/day: 1.50    Years: 30.00    Pack years: 45.00    Types: Cigarettes    Last attempt to quit: 03/09/2017    Years since quitting: 0.2  . Smokeless tobacco: Never Used  Substance and Sexual Activity  . Alcohol use: Yes    Alcohol/week: 3.0 - 6.0 oz    Types: 5 - 10 Cans of beer per week  . Drug use: Yes    Types: Marijuana  . Sexual activity: None  Other Topics Concern  . None  Social History Narrative   Denies    Review of Systems: A 12 point ROS discussed and pertinent positives are indicated in the HPI above.  All other systems are negative.  Review of Systems  Constitutional: Positive for activity change and fatigue. Negative for fever.  Respiratory: Negative for cough and shortness of breath.   Cardiovascular: Negative for chest pain.  Gastrointestinal: Positive for nausea. Negative for abdominal pain.  Musculoskeletal: Positive for back pain and gait problem.  Neurological: Positive for weakness.  Psychiatric/Behavioral: Negative for behavioral problems and  confusion.    Vital Signs: BP (!) 146/83   Pulse 73   Temp 98.9 F (37.2 C) (Oral)   Resp 18   Ht 5\' 10"  (1.778 m)   Wt 165 lb (74.8 kg)   SpO2 96%   BMI 23.68 kg/m   Physical Exam  Constitutional: He is oriented to person, place, and time.  Cardiovascular: Normal rate, regular rhythm and normal heart sounds.  Pulmonary/Chest: Effort normal. He has no wheezes.  Abdominal: Soft. Bowel sounds are normal. There is no tenderness.  Musculoskeletal: Normal range of motion.  Neurological: He is alert and oriented to person, place, and time.  Skin: Skin is warm and dry.  Psychiatric: He has a normal mood and affect. His behavior is normal. Judgment and thought content normal.  Nursing note and vitals reviewed.   Imaging: Dg Chest 2 View  Result Date: 05/02/2017 CLINICAL DATA:  61 year old male with syncope and chest tightness. EXAM: CHEST   2 VIEW COMPARISON:  Chest radiograph dated 08/16/2016 and chest CT dated 07/23/2015 FINDINGS: The lungs are clear. There is no pleural effusion or pneumothorax. The cardiac silhouette is within normal limits. Appy there is osteopenia with degenerative changes of the spine. Old midthoracic compression deformity similar to the CT of 07/23/2015 none no acute osseous pathology. IMPRESSION: No active cardiopulmonary disease. Electronically Signed   By: Anner Crete M.D.   On: 05/02/2017 23:43   Ct Angio Chest Pe W And/or Wo Contrast  Result Date: 05/03/2017 CLINICAL DATA:  Acute onset of shortness of breath. Initial encounter. EXAM: CT ANGIOGRAPHY CHEST WITH CONTRAST TECHNIQUE: Multidetector CT imaging of the chest was performed using the standard protocol during bolus administration of intravenous contrast. Multiplanar CT image reconstructions and MIPs were obtained to evaluate the vascular anatomy. There was partial extravasation of 40 mL of contrast into the patient's right antecubital fossa; the extravasation site was evaluated by the radiologist. An additional peripheral IV catheter could not be successfully placed. CONTRAST:  60 mL of Isovue 370 IV contrast COMPARISON:  Chest radiograph performed 05/02/2017, and CTA of the chest performed 07/23/2015 FINDINGS: Cardiovascular: There is no evidence of central pulmonary embolus. Evaluation for pulmonary embolus is suboptimal due to partial extravasation of the contrast bolus. The heart is normal in size. The thoracic aorta demonstrates minimal calcification. Mild calcification is seen along the proximal great vessels. Mediastinum/Nodes: There is a spiculated left upper lobe pulmonary nodule abutting the anterior aspect of the mediastinum, measuring approximately 2.2 x 2.1 x 1.4 cm and concerning for malignancy. This is new from 2017. The mediastinum is otherwise unremarkable. No mediastinal lymphadenopathy is seen. No pericardial effusion is identified. The  visualized portions of the thyroid gland are unremarkable. No axillary lymphadenopathy is seen. Lungs/Pleura: Scattered nodular opacities are seen throughout both lungs. There is suggestion of a miliary pattern, though some of the opacities are more diffuse. This may reflect atypical pneumonia. Fungal pneumonia or viral pneumonitis cannot be excluded. Tuberculosis is considered less likely. No pleural effusion or pneumothorax is seen. Upper Abdomen: The visualized portions of the liver and spleen are unremarkable. Musculoskeletal: No acute osseous abnormalities are identified. There is question of lytic lesions within vertebral bodies T1 and T2, apparently new from 2017. There is chronic loss of height at vertebral bodies T8 and T9. The visualized musculature is unremarkable in appearance. Review of the MIP images confirms the above findings. IMPRESSION: 1. No evidence of central pulmonary embolus. 2. Scattered nodular opacities throughout both lungs. Suggestion of a miliary pattern,  though some of the opacities are more diffuse. This is concerning for atypical pneumonia. Fungal pneumonia or viral pneumonitis cannot be excluded. Tuberculosis is considered less likely. Would correlate with the patient's symptoms. 3. Spiculated left upper lobe pulmonary nodule abutting the anterior aspect of the mediastinum, new from 2017. This measures 2.2 x 2.1 x 1.4 cm, and is concerning for primary bronchogenic malignancy. Tissue diagnosis and/or PET/CT would be helpful for further evaluation. 4. Question of lytic lesions within vertebral bodies T1 and T2, apparently new from 2017. This could be further assessed on bone scan or the PET/CT, as deemed clinically appropriate. Electronically Signed   By: Garald Balding M.D.   On: 05/03/2017 02:08   Mr Jeri Cos WF Contrast  Result Date: 05/03/2017 CLINICAL DATA:  Syncopal episode on way to bathroom. No head injury. Feeling unwell for 4 days. History of LEFT lung mass, alcohol  abuse, hypertension, hyperlipidemia. EXAM: MRI HEAD WITHOUT AND WITH CONTRAST TECHNIQUE: Multiplanar, multiecho pulse sequences of the brain and surrounding structures were obtained without and with intravenous contrast. CONTRAST:  58mL MULTIHANCE GADOBENATE DIMEGLUMINE 529 MG/ML IV SOLN COMPARISON:  CT HEAD August 16, 2016 FINDINGS: BRAIN: No reduced diffusion to suggest acute ischemia or hypercellular tumor. No susceptibility artifact to suggest hemorrhage. Patchy supratentorial white matter FLAIR T2 hyperintensities. Mild ex vacuo dilatation LEFT lateral ventricle, ventricles and sulci are overall mildly prominent for patient's age. LEFT midbrain prominent perivascular space, normal variant. No suspicious parenchymal signal, mass or mass effect. No abnormal intraparenchymal or extra-axial enhancement. No abnormal extra-axial fluid collections. VASCULAR: Normal major intracranial vascular flow voids present at skull base. SKULL AND UPPER CERVICAL SPINE: No abnormal sellar expansion. No suspicious calvarial bone marrow signal. Craniocervical junction maintained. SINUSES/ORBITS: The mastoid air-cells and included paranasal sinuses are well-aerated. The included ocular globes and orbital contents are non-suspicious. OTHER: None. IMPRESSION: 1. No acute intracranial process or metastasis. 2. Mild parenchymal brain volume loss for age. 3. Mild chronic small vessel ischemic disease. Electronically Signed   By: Elon Alas M.D.   On: 05/03/2017 23:56   Mr Thoracic Spine W Wo Contrast  Result Date: 05/04/2017 CLINICAL DATA:  Syncopal episode on way to bathroom. No head injury. Feeling unwell for 4 days. History of LEFT lung mass, alcohol abuse, hypertension, hyperlipidemia. EXAM: MRI THORACIC WITHOUT AND WITH CONTRAST TECHNIQUE: Multiplanar and multiecho pulse sequences of the thoracic spine were obtained without and with intravenous contrast. CONTRAST:  88mL MULTIHANCE GADOBENATE DIMEGLUMINE 529 MG/ML IV SOLN  COMPARISON:  CT chest May 03, 2017 at 0117 hours FINDINGS: Mildly motion degraded examination. ALIGNMENT: Maintenance of the thoracic kyphosis. No malalignment. VERTEBRAE/DISCS: Vertebral bodies are intact. Multiple low T1, I bright T2 osseous metastasis, predominately enhancing metastasis stoma which demonstrate central necrosis at T2, T4, T7, T9, T10, T12, L1 and L2. No pathologic fracture. Old mild T3, T4, moderate to severe T8 compression fracture. Disc desiccation all levels with multilevel mid to lower thoracic moderate chronic discogenic endplate changes. CORD: Thoracic spinal cord is normal morphology and signal characteristics to the level of the conus medullaris which terminates at T12-L1. Axial sequences are mildly motion degraded, multiple tiny foci T1 shortening most compatible with flow void without convincing evidence of metastasis. PREVERTEBRAL AND PARASPINAL SOFT TISSUES: Faint enhancement RIGHT T4-5 inner costal space of equivocal for metastasis (sagittal 1/17) DISC LEVELS: Multilevel small broad-based disc bulges without canal stenosis or neural foraminal narrowing at any level. IMPRESSION: 1. 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 2. Old mild T3, old  mild T4 and moderate to severe old T8 compression fractures. 3. No significant canal stenosis or neural foraminal narrowing at any level. Electronically Signed   By: Elon Alas M.D.   On: 05/04/2017 00:10   Ct Abdomen Pelvis W Contrast  Result Date: 05/04/2017 CLINICAL DATA:  Unintended weight loss, abdominal pain. New lung mass. EXAM: CT ABDOMEN AND PELVIS WITH CONTRAST TECHNIQUE: Multidetector CT imaging of the abdomen and pelvis was performed using the standard protocol following bolus administration of intravenous contrast. CONTRAST:  100 cc Isovue 300 IV COMPARISON:  Chest CT 05/03/2017. FINDINGS: Lower chest: Are lung bases clear.  No effusions. Hepatobiliary: Suspect mild diffuse fatty infiltration of  the liver. Area of focal scratched at area of more focal fatty infiltration near the falciform ligament. Gallbladder unremarkable. Pancreas: No focal abnormality or ductal dilatation. Spleen: No focal abnormality.  Normal size. Adrenals/Urinary Tract: Small bilateral renal cysts. No hydronephrosis. Adrenal glands and urinary bladder unremarkable. Stomach/Bowel: Sigmoid diverticulosis. No active diverticulitis. Stomach and small bowel decompressed, unremarkable. Vascular/Lymphatic: Aortic and iliac calcifications. No evidence of aneurysm or adenopathy. Reproductive: No visible focal abnormality. Other: No free fluid or free air. Small umbilical and supraumbilical hernias containing fat. Musculoskeletal: No acute bony abnormality. IMPRESSION: Suspect mild fatty infiltration of the liver. Small bilateral renal cysts. Aortoiliac atherosclerosis. Sigmoid diverticulosis. No acute findings. Electronically Signed   By: Rolm Baptise M.D.   On: 05/04/2017 12:07   Nm Pet Image Initial (pi) Skull Base To Thigh  Result Date: 05/11/2017 CLINICAL DATA:  Initial treatment strategy for left upper lobe lung lesion. EXAM: NUCLEAR MEDICINE PET SKULL BASE TO THIGH TECHNIQUE: 8.1 mCi F-18 FDG was injected intravenously. Full-ring PET imaging was performed from the skull base to thigh after the radiotracer. CT data was obtained and used for attenuation correction and anatomic localization. FASTING BLOOD GLUCOSE:  Value: 95 mg/dl COMPARISON:  Chest CT 05/03/2017 FINDINGS: NECK: No hypermetabolic lymph nodes in the neck. CHEST: Nonenlarged and weakly hypermetabolic left axillary lymph nodes may be inflammatory and related to the left scapular lesion. 2 cm left upper lobe lung lesion adjacent to the pericardium on image number 33 is hypermetabolic with SUV max of 9.6. This is likely a primary lung neoplasm. No definite metastatic pulmonary nodules. The centrilobular nodular appearance on the prior chest CT has improved and was more  likely due to respiratory bronchiolitis or hypersensitivity pneumonitis. Single hypermetabolic prevascular lymph node on image number 63 measures 6.5 mm and SUV max is 4.22. ABDOMEN/PELVIS: No abnormal hypermetabolic activity within the liver, pancreas, adrenal glands, or spleen. No hypermetabolic lymph nodes in the abdomen or pelvis. SKELETON: Diffuse osseous metastatic disease. This involves the axial and appendicular skeleton. There are bilateral scapular lesions, numerous thoracic and lumbar vertebral body lesions, rib lesions, pelvic bone lesions and left hip lesions. Areas of cortical destruction are noted notably involving the lesion in the left scapular body and the right iliac bone. No spinal canal compromise. Index lesion involving left scapula has an SUV max of 15.3. The left-sided T3 lesion has an SUV max of 11.7. Right iliac bone lesion has SUV max of 12.5 IMPRESSION: 1. Hypermetabolic left upper lobe lung lesion consistent with primary lung neoplasm. There is also a metabolically active left prevascular lymph node. 2. Diffuse lytic osseous metastatic disease. 3. No findings for pulmonary metastatic disease or abdominal/pelvic metastatic disease. Electronically Signed   By: Marijo Sanes M.D.   On: 05/11/2017 12:56    Labs:  CBC: Recent Labs  05/05/17 0556 05/08/17 1217 05/24/17 1223 06/01/17 0600  WBC 5.7 9.1 7.3 8.9  HGB 12.2* 13.6 12.5* 12.5*  HCT 35.5* 40.6 37.5* 37.4*  PLT 203 334.0 234 266    COAGS: Recent Labs    08/17/16 0001 06/01/17 0600  INR 0.98 0.97  APTT  --  32    BMP: Recent Labs    05/03/17 0818 05/03/17 1231 05/04/17 0501 05/05/17 0556 05/08/17 1217 05/24/17 1223  NA 124* 126* 129* 133* 132* 134*  K 3.7 3.6 3.4* 3.4* 5.2* 4.2  CL 90* 93* 91* 96* 98  --   CO2 23 23 28 26 25 25   GLUCOSE 105* 97 100* 101* 96 92  BUN 7 5* <5* <5* 16 10.7  CALCIUM 8.0* 8.4* 9.2 9.0 10.0 9.3  CREATININE 0.72 0.73 0.68 0.62 1.24 0.9  GFRNONAA >60 >60 >60 >60  --    --   GFRAA >60 >60 >60 >60  --   --     LIVER FUNCTION TESTS: Recent Labs    08/17/16 0001 05/24/17 1223  BILITOT 0.5 0.23  AST 40 19  ALT 34 11  ALKPHOS 51 121  PROT 6.1* 7.5  ALBUMIN 3.1* 3.2*    TUMOR MARKERS: No results for input(s): AFPTM, CEA, CA199, CHROMGRNA in the last 8760 hours.  Assessment and Plan:  LUL lesion Bony lesions Lymphadenopathy Prostate Ca Hx  Scheduled now for right pelvic bone lesion bx Risks and benefits discussed with the patient including, but not limited to bleeding, infection, damage to adjacent structures or low yield requiring additional tests. All of the patient's questions were answered, patient is agreeable to proceed. Consent signed and in chart.  Thank you for this interesting consult.  I greatly enjoyed meeting Tanner [name] and look forward to participating in their care.  A copy of this report was sent to the requesting provider on this date.  Electronically Signed: Lavonia Drafts, PA-C 06/01/2017, 7:19 AM   I spent a total of  30 Minutes   in face to face in clinical consultation, greater than 50% of which was counseling/coordinating care for right pelvic bone lesion bx

## 2017-06-01 NOTE — Sedation Documentation (Signed)
Biopsies taken °

## 2017-06-01 NOTE — Procedures (Signed)
CT guided bone biopsy of right iliac lytic lesion.  4 cores obtained.  Minimal blood loss and no immediate complication.

## 2017-06-01 NOTE — Sedation Documentation (Signed)
Pt tolerated procedure very well, report given to paige in ss

## 2017-06-04 ENCOUNTER — Ambulatory Visit: Payer: Medicare HMO | Admitting: Radiation Oncology

## 2017-06-05 ENCOUNTER — Other Ambulatory Visit: Payer: Self-pay | Admitting: Internal Medicine

## 2017-06-05 ENCOUNTER — Ambulatory Visit: Payer: Medicare HMO

## 2017-06-05 ENCOUNTER — Telehealth: Payer: Self-pay | Admitting: *Deleted

## 2017-06-05 ENCOUNTER — Encounter: Payer: Self-pay | Admitting: Internal Medicine

## 2017-06-05 ENCOUNTER — Ambulatory Visit (HOSPITAL_BASED_OUTPATIENT_CLINIC_OR_DEPARTMENT_OTHER): Payer: Medicare HMO | Admitting: Internal Medicine

## 2017-06-05 ENCOUNTER — Telehealth: Payer: Self-pay | Admitting: Internal Medicine

## 2017-06-05 ENCOUNTER — Encounter: Payer: Self-pay | Admitting: *Deleted

## 2017-06-05 VITALS — BP 118/75 | HR 70 | Temp 98.7°F | Resp 17 | Ht 70.0 in | Wt 165.0 lb

## 2017-06-05 DIAGNOSIS — C801 Malignant (primary) neoplasm, unspecified: Secondary | ICD-10-CM

## 2017-06-05 DIAGNOSIS — F17200 Nicotine dependence, unspecified, uncomplicated: Secondary | ICD-10-CM

## 2017-06-05 DIAGNOSIS — R0602 Shortness of breath: Secondary | ICD-10-CM

## 2017-06-05 DIAGNOSIS — Z510 Encounter for antineoplastic radiation therapy: Secondary | ICD-10-CM | POA: Diagnosis not present

## 2017-06-05 DIAGNOSIS — R918 Other nonspecific abnormal finding of lung field: Secondary | ICD-10-CM | POA: Diagnosis not present

## 2017-06-05 DIAGNOSIS — G893 Neoplasm related pain (acute) (chronic): Secondary | ICD-10-CM | POA: Diagnosis not present

## 2017-06-05 DIAGNOSIS — R69 Illness, unspecified: Secondary | ICD-10-CM | POA: Diagnosis not present

## 2017-06-05 DIAGNOSIS — C7951 Secondary malignant neoplasm of bone: Secondary | ICD-10-CM

## 2017-06-05 DIAGNOSIS — C61 Malignant neoplasm of prostate: Secondary | ICD-10-CM

## 2017-06-05 MED ORDER — OXYCODONE HCL 5 MG PO TABS: 5.0000 mg | ORAL_TABLET | Freq: Four times a day (QID) | ORAL | 0 refills | Status: DC | PRN

## 2017-06-05 NOTE — Telephone Encounter (Signed)
Called pt's Pharmacy regarding Rx for Oxy- [facility] needed pt's diagnosis codes in order to fill Rx. Called pt advised his medication should be ready in the next hour.

## 2017-06-05 NOTE — Progress Notes (Signed)
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  He presented with left upper lobe pulmonary nodule in addition to left prevascular lymph node and multiple metastatic bone disease.  PRIOR THERAPY: None.  CURRENT THERAPY: Palliative radiotherapy to the metastatic bone disease in the right pelvic area under the care of Dr. Sondra Come.  First fraction tomorrow.   INTERVAL HISTORY: Tanner [name] 61 y.o. male returns to the clinic today for returns to the clinic today for follow-up visit accompanied by his ex-wife.  The patient continues to complain of pain in the lower spine as well as the right pelvic area.  He is requesting refill of his pain medication with oxycodone.  He denied having any current chest pain but has shortness breath with exertion with mild cough and no hemoptysis.  He denied having any recent weight loss or night sweats.  He has no nausea, vomiting, diarrhea or constipation.  The patient has no fever or chills.  He has no headache or visual changes.  He underwent CT-guided core biopsy of the right iliac lytic bone lesion by interventional radiology.  The final pathology is still pending but preliminary report indicated suspicious spindle cell neoplasm.  The patient is here today for evaluation and recommendation regarding treatment of his condition.  MEDICAL HISTORY: Past Medical History:  Diagnosis Date  . Alcohol abuse   . Alcoholic hepatitis   . Allergy   . Anxiety   . 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 C. difficile colitis   . Depression   . DVT (deep venous thrombosis) ([place]) 2015  . GERD (gastroesophageal reflux disease)   . Hypertension   . Ileus ([place]) 09/2013  . 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 Shortness of breath    new onset- occasionally-at rest and exertion  . Substance abuse ([place])   . TIA (transient ischemic attack) 08/2013    ALLERGIES:  is allergic to dilaudid [hydromorphone hcl]; procaine hcl; zofran [ondansetron]; and sulfa antibiotics.  MEDICATIONS:  Current Outpatient Medications  Medication Sig Dispense Refill  . amLODipine (NORVASC) 10 MG tablet Take 1 tablet (10 mg total) daily by mouth. 90 tablet 1  . bifidobacterium infantis (ALIGN) capsule Take 1 capsule daily by mouth.    . diazepam (VALIUM) 5 MG tablet Take 1 tablet (5 mg total) 2 (two) times daily as needed by mouth for anxiety or muscle spasms. 60 tablet 0  . FLUoxetine (PROZAC) 40 MG capsule TAKE 1 CAPSULE BY MOUTH EVERY DAY 90 capsule 1  . lisinopril (PRINIVIL,ZESTRIL) 40 MG tablet TAKE ONE (1) TABLET BY MOUTH EVERY DAY 90 tablet 1  . loperamide (IMODIUM) 2 MG capsule Take 2 capsules (4 mg total) by mouth every 8 (eight) hours as needed for diarrhea or loose stools. Over the counter 30 capsule 0  . metoprolol tartrate (LOPRESSOR) 100 MG tablet Take 1 tablet (100 mg total) by mouth 2 (two) times daily. 180 tablet 1  . omeprazole (PRILOSEC) 40 MG capsule TAKE 1 CAPSULE BY MOUTH TWICE A DAY 120 capsule 0  . oxyCODONE (OXY IR/ROXICODONE) 5 MG immediate release tablet Take 1 tablet (5 mg total) every 6 (six) hours as needed by mouth for severe pain. 30 tablet 0  .  simvastatin (ZOCOR) 20 MG tablet TAKE ONE (1) TABLET BY MOUTH EVERY DAY 90 tablet 1  . thiamine 100 MG tablet Take 1 tablet (100 mg total) by mouth daily. 30 tablet 3  . traZODone (DESYREL) 50 MG tablet Take 1 tablet (50 mg total) by mouth at bedtime as needed. for sleep 30 tablet 0   No current facility-administered medications for this visit.     SURGICAL HISTORY:  Past Surgical History:  Procedure Laterality Date  . HAND SURGERY Right   . HERNIA REPAIR    . INTRAMEDULLARY (IM) NAIL INTERTROCHANTERIC Right 08/17/2016   Procedure:  INTRAMEDULLARY (IM) NAIL RIGHT HIP;  Surgeon: Tania Ade, MD;  Location: [facility];  Service: Orthopedics;  Laterality: Right;  . LYMPHADENECTOMY Bilateral 08/29/2013   Procedure: LYMPHADENECTOMY "BILATERAL PELVIC LYMPH NODE DISSECTION";  Surgeon: Molli Hazard, MD;  Location: [facility];  Service: Urology;  Laterality: Bilateral;  . ORIF ANKLE FRACTURE Right 12/10/2012   Procedure: OPEN REDUCTION INTERNAL FIXATION (ORIF) ANKLE FRACTURE;  Surgeon: Hessie Dibble, MD;  Location: [facility];  Service: Orthopedics;  Laterality: Right;  . ROBOT ASSISTED LAPAROSCOPIC RADICAL PROSTATECTOMY N/A 08/29/2013   Procedure: ROBOTIC ASSISTED LAPAROSCOPIC RADICAL PROSTATECTOMY LEVEL 2, Lysis of adhesions;  Surgeon: Molli Hazard, MD;  Location: [facility];  Service: Urology;  Laterality: N/A;    REVIEW OF SYSTEMS:  Constitutional: positive for fatigue Eyes: negative Ears, nose, mouth, throat, and face: negative Respiratory: positive for dyspnea on exertion Cardiovascular: negative Gastrointestinal: negative Genitourinary:negative Integument/breast: negative Hematologic/lymphatic: negative Musculoskeletal:positive for back pain and bone pain Neurological: negative Behavioral/Psych: negative Endocrine: negative Allergic/Immunologic: negative   PHYSICAL EXAMINATION: General appearance: alert, cooperative, fatigued and no distress Head: Normocephalic, without obvious abnormality, atraumatic Neck: no adenopathy, no JVD, supple, symmetrical, trachea midline and thyroid not enlarged, symmetric, no tenderness/mass/nodules Lymph nodes: Cervical, supraclavicular, and axillary nodes normal. Resp: clear to auscultation bilaterally Back: symmetric, no curvature. ROM normal. No CVA tenderness. Cardio: regular rate and rhythm, S1, S2 normal, no murmur, click, rub or gallop GI: soft, non-tender; bowel sounds normal; no masses,  no organomegaly Extremities: extremities normal, atraumatic, no cyanosis or  edema Neurologic: Alert and oriented X 3, normal strength and tone. Normal symmetric reflexes. Normal coordination and gait  ECOG PERFORMANCE STATUS: 1 - Symptomatic but completely ambulatory  Blood pressure 118/75, pulse 70, temperature 98.7 F (37.1 C), temperature source Oral, resp. rate 17, height 5\' 10"  (1.778 m), weight 165 lb (74.8 kg), SpO2 98 %.  LABORATORY DATA: Lab Results  Component Value Date   WBC 8.9 06/01/2017   HGB 12.5 (L) 06/01/2017   HCT 37.4 (L) 06/01/2017   MCV 99.2 06/01/2017   PLT 266 06/01/2017      Chemistry      Component Value Date/Time   NA 134 (L) 05/24/2017 1223   K 4.2 05/24/2017 1223   CL 98 05/08/2017 1217   CO2 25 05/24/2017 1223   BUN 10.7 05/24/2017 1223   CREATININE 0.9 05/24/2017 1223      Component Value Date/Time   CALCIUM 9.3 05/24/2017 1223   ALKPHOS 121 05/24/2017 1223   AST 19 05/24/2017 1223   ALT 11 05/24/2017 1223   BILITOT 0.23 05/24/2017 1223       RADIOGRAPHIC STUDIES: Nm Pet Image Initial (pi) Skull Base To Thigh  Result Date: 05/11/2017 CLINICAL DATA:  Initial treatment strategy for left upper lobe lung lesion. EXAM: NUCLEAR MEDICINE PET SKULL BASE TO THIGH TECHNIQUE: 8.1 mCi F-18 FDG was injected intravenously. Full-ring PET imaging  was performed from the skull base to thigh after the radiotracer. CT data was obtained and used for attenuation correction and anatomic localization. FASTING BLOOD GLUCOSE:  Value: 95 mg/dl COMPARISON:  Chest CT 05/03/2017 FINDINGS: NECK: No hypermetabolic lymph nodes in the neck. CHEST: Nonenlarged and weakly hypermetabolic left axillary lymph nodes may be inflammatory and related to the left scapular lesion. 2 cm left upper lobe lung lesion adjacent to the pericardium on image number 33 is hypermetabolic with SUV max of 9.6. This is likely a primary lung neoplasm. No definite metastatic pulmonary nodules. The centrilobular nodular appearance on the prior chest CT has improved and was more  likely due to respiratory bronchiolitis or hypersensitivity pneumonitis. Single hypermetabolic prevascular lymph node on image number 63 measures 6.5 mm and SUV max is 4.22. ABDOMEN/PELVIS: No abnormal hypermetabolic activity within the liver, pancreas, adrenal glands, or spleen. No hypermetabolic lymph nodes in the abdomen or pelvis. SKELETON: Diffuse osseous metastatic disease. This involves the axial and appendicular skeleton. There are bilateral scapular lesions, numerous thoracic and lumbar vertebral body lesions, rib lesions, pelvic bone lesions and left hip lesions. Areas of cortical destruction are noted notably involving the lesion in the left scapular body and the right iliac bone. No spinal canal compromise. Index lesion involving left scapula has an SUV max of 15.3. The left-sided T3 lesion has an SUV max of 11.7. Right iliac bone lesion has SUV max of 12.5 IMPRESSION: 1. Hypermetabolic left upper lobe lung lesion consistent with primary lung neoplasm. There is also a metabolically active left prevascular lymph node. 2. Diffuse lytic osseous metastatic disease. 3. No findings for pulmonary metastatic disease or abdominal/pelvic metastatic disease. Electronically Signed   By: Marijo Sanes M.D.   On: 05/11/2017 12:56   Ct Biopsy  Result Date: 06/01/2017 INDICATION: 61 year old male with a suspicious left upper lung lesion and multiple bone lesions. Patient also has a history of prostate cancer. Patient needs a tissue diagnosis. EXAM: CT-GUIDED BIOPSY OF RIGHT ILIAC LYTIC BONE LESION MEDICATIONS: None. ANESTHESIA/SEDATION: Moderate (conscious) sedation was employed during this procedure. A total of Versed 2.0 mg and Fentanyl 100 mcg was administered intravenously. Moderate Sedation Time: 26 minutes. The patient's level of consciousness and vital signs were monitored continuously by radiology nursing throughout the procedure under my direct supervision. FLUOROSCOPY TIME:  None COMPLICATIONS: None  immediate. PROCEDURE: Informed written consent was obtained from the patient after a thorough discussion of the procedural risks, benefits and alternatives. All questions were addressed. A timeout was performed prior to the initiation of the procedure. Patient was placed in the CT scanner and positioned slightly on his left side. CT images through the pelvis were obtained. The destructive lesion involving the right iliac crest was targeted. Right side of the back was prepped and draped in sterile fashion. Skin and soft tissues were anesthetized with 1% lidocaine. 17 gauge coaxial needle was directed through the partially destroyed posterior cortex of the right iliac bone with CT guidance. Needle was placed within the soft tissue component of the lesion. Four core biopsies were obtained with an 18 gauge device. Specimens placed in formalin. Needle was removed without complication. Bandage placed over the puncture site. FINDINGS: Soft tissue lytic lesion involving the right iliac crest with partial destruction of the posterior cortex. Needle was easily advanced into the soft tissue component and core biopsies were obtained. IMPRESSION: Successful CT-guided core biopsies of the right iliac lytic bone lesion. Electronically Signed   By: Markus Daft M.D.   On:  06/01/2017 09:20    ASSESSMENT AND PLAN: This is a very pleasant 61 years old white male with metastatic neoplasm of unknown type and primary at this point, suspicious for spindle cell neoplasm. I had a lengthy discussion with the patient and his ex-wife about his current condition and treatment options. I recommended for the patient to proceed with the palliative radiotherapy to the painful lytic lesion in the right pelvis as recommended by Dr. Sondra Come. I also discussed with the patient consideration of repeat biopsy.  We will discuss his case at the weekly thoracic conference to see the best site for rebiopsy and confirmation of his tissue diagnosis. For  pain management I gave the patient refill of oxycodone today. I will see him back for follow-up visit in 2 weeks for reevaluation and more discussion of his treatment options based on the final pathology. The patient was advised to call immediately if he has any concerning symptoms in the interval. The patient voices understanding of current disease status and treatment options and is in agreement with the current care plan.  All questions were answered. The patient knows to call the clinic with any problems, questions or concerns. We can certainly see the patient much sooner if necessary.  I spent 15 minutes counseling the patient face to face. The total time spent in the appointment was 25 minutes.  Disclaimer: This note was dictated with voice recognition software. Similar sounding words can inadvertently be transcribed and may not be corrected upon review.

## 2017-06-05 NOTE — Progress Notes (Signed)
Oncology Nurse Navigator Documentation  Oncology Nurse Navigator Flowsheets 06/05/2017  Navigator Location [facility]  Navigator Encounter Type Clinic/MDC/I spoke with patient and family today.  According to Dr. Julien Nordmann, patient's biopsy needs to be discussed at cancer conference this week.  It is unclear of tissue DX.  I will update pathology on need to present.  Patient was given patient medication prescription. I updated him on medication and how to take. He was to have post sim education. I called Rad Onc and Thayer Headings states she can do Special educational needs teacher. I updated patient.   Patient Visit Type MedOnc  Treatment Phase Pre-Tx/Tx Discussion  Barriers/Navigation Needs Coordination of Care;Education  Education Other  Interventions Coordination of Care;Education  Coordination of Care Other  Education Method Verbal  Acuity Level 2  Acuity Level 2 Other  Time Spent with Patient 30

## 2017-06-05 NOTE — Telephone Encounter (Signed)
Scheduled appt per 11/27 los - patient is aware of appt date and time - my chart active.

## 2017-06-06 ENCOUNTER — Ambulatory Visit
Admission: RE | Admit: 2017-06-06 | Discharge: 2017-06-06 | Disposition: A | Discharge status: Home / Self Care | Payer: Medicare HMO | Source: Ambulatory Visit | Attending: Radiation Oncology | Admitting: Radiation Oncology

## 2017-06-06 ENCOUNTER — Ambulatory Visit: Payer: Medicare HMO

## 2017-06-06 ENCOUNTER — Ambulatory Visit: Payer: Medicare HMO | Admitting: Radiation Oncology

## 2017-06-06 DIAGNOSIS — C7951 Secondary malignant neoplasm of bone: Secondary | ICD-10-CM | POA: Diagnosis not present

## 2017-06-06 DIAGNOSIS — Z510 Encounter for antineoplastic radiation therapy: Secondary | ICD-10-CM | POA: Diagnosis not present

## 2017-06-07 ENCOUNTER — Other Ambulatory Visit: Payer: Self-pay | Admitting: Internal Medicine

## 2017-06-07 ENCOUNTER — Ambulatory Visit
Admission: RE | Admit: 2017-06-07 | Discharge: 2017-06-07 | Disposition: A | Discharge status: Home / Self Care | Payer: Medicare HMO | Source: Ambulatory Visit | Attending: Radiation Oncology | Admitting: Radiation Oncology

## 2017-06-07 DIAGNOSIS — C7951 Secondary malignant neoplasm of bone: Secondary | ICD-10-CM | POA: Diagnosis not present

## 2017-06-07 DIAGNOSIS — Z510 Encounter for antineoplastic radiation therapy: Secondary | ICD-10-CM | POA: Diagnosis not present

## 2017-06-07 DIAGNOSIS — R918 Other nonspecific abnormal finding of lung field: Secondary | ICD-10-CM

## 2017-06-08 ENCOUNTER — Ambulatory Visit
Admission: RE | Admit: 2017-06-08 | Discharge: 2017-06-08 | Disposition: A | Discharge status: Home / Self Care | Payer: Medicare HMO | Source: Ambulatory Visit | Attending: Radiation Oncology | Admitting: Radiation Oncology

## 2017-06-08 ENCOUNTER — Ambulatory Visit: Payer: Medicare HMO

## 2017-06-08 ENCOUNTER — Telehealth: Payer: Self-pay | Admitting: *Deleted

## 2017-06-08 DIAGNOSIS — Z510 Encounter for antineoplastic radiation therapy: Secondary | ICD-10-CM | POA: Diagnosis not present

## 2017-06-08 DIAGNOSIS — C7951 Secondary malignant neoplasm of bone: Secondary | ICD-10-CM | POA: Diagnosis not present

## 2017-06-08 NOTE — Telephone Encounter (Signed)
Oncology Nurse Navigator Documentation  Oncology Nurse Navigator Flowsheets 06/08/2017  Navigator Location [facility]  Navigator Encounter Type Treatment/I called Tanner [name] to updated him on cancer conference discussion. I was unable to reach him. I did leave a vm message to call.   Telephone Outgoing Call  Treatment Phase Treatment  Barriers/Navigation Needs Education  Education Other  Interventions Education  Education Method Verbal  Acuity Level 1  Time Spent with Patient 15

## 2017-06-11 ENCOUNTER — Ambulatory Visit
Admission: RE | Admit: 2017-06-11 | Discharge: 2017-06-11 | Disposition: A | Discharge status: Home / Self Care | Payer: Medicare HMO | Source: Ambulatory Visit | Attending: Radiation Oncology | Admitting: Radiation Oncology

## 2017-06-11 DIAGNOSIS — C7951 Secondary malignant neoplasm of bone: Secondary | ICD-10-CM | POA: Diagnosis not present

## 2017-06-11 DIAGNOSIS — Z510 Encounter for antineoplastic radiation therapy: Secondary | ICD-10-CM | POA: Diagnosis not present

## 2017-06-11 NOTE — Progress Notes (Signed)
Patient  Education done, L4-5,b/l hips,  Rectal irritation,  bladder changes, biafine cream, my business card, radiation therapy and you book given to patient, skin irritation, fatigue, pain, loss appetitte, loss hair area being treated, increase protein in diet,drink 6-8 glasses water daily,, may need to eat 5-6 smaller meals instead of 3 meals  Daily, luke warm shower/baths, mild unscented soap, no rubbing scrubbing, or scratching areas treated, usee biafine cream after rad daily and bedtime if needed, nothing 4 hours prior to rad txs, get enough sleep and rest, dfo some form of excerise daily, sees MD weekly and prn, teach back given 12:35 PM'

## 2017-06-12 ENCOUNTER — Ambulatory Visit
Admission: RE | Admit: 2017-06-12 | Discharge: 2017-06-12 | Disposition: A | Discharge status: Home / Self Care | Payer: Medicare HMO | Source: Ambulatory Visit | Attending: Radiation Oncology | Admitting: Radiation Oncology

## 2017-06-12 DIAGNOSIS — Z510 Encounter for antineoplastic radiation therapy: Secondary | ICD-10-CM | POA: Diagnosis not present

## 2017-06-12 DIAGNOSIS — C7951 Secondary malignant neoplasm of bone: Secondary | ICD-10-CM | POA: Diagnosis not present

## 2017-06-13 ENCOUNTER — Ambulatory Visit
Admission: RE | Admit: 2017-06-13 | Discharge: 2017-06-13 | Disposition: A | Discharge status: Home / Self Care | Payer: Medicare HMO | Source: Ambulatory Visit | Attending: Radiation Oncology | Admitting: Radiation Oncology

## 2017-06-13 ENCOUNTER — Other Ambulatory Visit: Payer: Self-pay | Admitting: Radiation Oncology

## 2017-06-13 DIAGNOSIS — C7951 Secondary malignant neoplasm of bone: Secondary | ICD-10-CM | POA: Diagnosis not present

## 2017-06-13 DIAGNOSIS — Z510 Encounter for antineoplastic radiation therapy: Secondary | ICD-10-CM | POA: Diagnosis not present

## 2017-06-13 MED ORDER — OXYCODONE HCL 5 MG PO TABS: 5.0000 mg | ORAL_TABLET | Freq: Four times a day (QID) | ORAL | 0 refills | Status: DC | PRN

## 2017-06-14 ENCOUNTER — Ambulatory Visit
Admission: RE | Admit: 2017-06-14 | Discharge: 2017-06-14 | Disposition: A | Discharge status: Home / Self Care | Payer: Medicare HMO | Source: Ambulatory Visit | Attending: Radiation Oncology | Admitting: Radiation Oncology

## 2017-06-14 ENCOUNTER — Encounter: Payer: Self-pay | Admitting: General Practice

## 2017-06-14 ENCOUNTER — Telehealth: Payer: Self-pay | Admitting: *Deleted

## 2017-06-14 DIAGNOSIS — Z510 Encounter for antineoplastic radiation therapy: Secondary | ICD-10-CM | POA: Diagnosis not present

## 2017-06-14 DIAGNOSIS — C7951 Secondary malignant neoplasm of bone: Secondary | ICD-10-CM | POA: Diagnosis not present

## 2017-06-14 NOTE — Telephone Encounter (Signed)
Patient had called stating his pharmacy needs a code before filling out his OXY IR that Nadara Mustard wrote for, spoke with the patient and will call pharmacy

## 2017-06-14 NOTE — Progress Notes (Signed)
Tanner [name] Psychosocial Distress Screening Clinical Social Work  Clinical Social Work was referred by distress screening protocol.  The patient scored a 8 on the Psychosocial Distress Thermometer which indicates moderate distress. Clinical Social Worker Edwyna Shell to assess for distress and other psychosocial needs. Called patient, unable to reach.  Left VM requesting call back, reviewed available support services via the [facility], offered to mail information if needed, left my contact information.   ONCBCN DISTRESS SCREENING 05/24/2017  Distress experienced in past week (1-10) 8  Emotional problem type Depression;Nervousness/Anxiety;Adjusting to illness;Boredom  Physical Problem type Pain;Sleep/insomnia;Getting around;Bathing/dressing;Breathing;Constipation/diarrhea;Tingling hands/feet;Skin dry/itchy  Physician notified of physical symptoms Yes  Referral to clinical psychology No  Referral to clinical social work Yes    Clinical Social Worker follow up needed: No.  If yes, follow up plan:   Edwyna Shell, LCSW Clinical Social Worker Phone:  [phone]

## 2017-06-14 NOTE — Telephone Encounter (Signed)
Called pharmacu they just needed his icd  Dx, gave it and they will call patient when pain rx OXI IR is ready 1:05 PM

## 2017-06-15 ENCOUNTER — Ambulatory Visit
Admission: RE | Admit: 2017-06-15 | Discharge: 2017-06-15 | Disposition: A | Discharge status: Home / Self Care | Payer: Medicare HMO | Source: Ambulatory Visit | Attending: Radiation Oncology | Admitting: Radiation Oncology

## 2017-06-15 DIAGNOSIS — Z510 Encounter for antineoplastic radiation therapy: Secondary | ICD-10-CM | POA: Diagnosis not present

## 2017-06-15 DIAGNOSIS — C7951 Secondary malignant neoplasm of bone: Secondary | ICD-10-CM | POA: Diagnosis not present

## 2017-06-18 ENCOUNTER — Ambulatory Visit: Payer: Medicare HMO

## 2017-06-18 ENCOUNTER — Other Ambulatory Visit: Payer: Self-pay | Admitting: Radiology

## 2017-06-18 ENCOUNTER — Other Ambulatory Visit: Payer: Self-pay | Admitting: Family

## 2017-06-19 ENCOUNTER — Other Ambulatory Visit: Payer: Self-pay | Admitting: General Surgery

## 2017-06-19 ENCOUNTER — Ambulatory Visit: Payer: Medicare HMO | Admitting: Oncology

## 2017-06-19 ENCOUNTER — Ambulatory Visit: Payer: Medicare HMO

## 2017-06-19 ENCOUNTER — Telehealth: Payer: Self-pay | Admitting: Internal Medicine

## 2017-06-19 ENCOUNTER — Other Ambulatory Visit: Payer: Medicare HMO

## 2017-06-19 ENCOUNTER — Other Ambulatory Visit: Payer: Self-pay | Admitting: Nurse Practitioner

## 2017-06-19 NOTE — Telephone Encounter (Signed)
R/s appt per 12/11 sch message - patient is aware of appt date and time.

## 2017-06-20 ENCOUNTER — Ambulatory Visit: Payer: Medicare HMO | Admitting: Oncology

## 2017-06-20 ENCOUNTER — Ambulatory Visit: Payer: Medicare HMO

## 2017-06-20 ENCOUNTER — Ambulatory Visit (HOSPITAL_COMMUNITY): Admission: RE | Admit: 2017-06-20 | Payer: Medicare HMO | Source: Ambulatory Visit

## 2017-06-20 ENCOUNTER — Other Ambulatory Visit: Payer: Medicare HMO

## 2017-06-20 ENCOUNTER — Telehealth: Payer: Self-pay | Admitting: Radiation Oncology

## 2017-06-20 NOTE — Telephone Encounter (Signed)
I spoke with the patient. To summarize, he is receiving treatment to the following locations: Left scapula and T2 spine, the Left femur, Right femur, and right iliac bone/sacrum. He continues to have low back pain and does have disease throughout the lumbar spine. After reviewing his case with Dr. Lisbeth Renshaw, he would offer the patient a course of palliative radiotherapy to the lumbar spine as well. The number of sites we're currently treating have limited US from treating additional sites. His pain is truly in the lumbar spine and causing him significant difficulty with his mid low back when standing or sitting. He reports improvement in his hip/leg pain as well as in his left shoulder blade. He has not started any systemic therapy yet, and is waiting for another biopsy on 12/27 to determine which approach he takes, with either chemotherapy, or immunotherapy. If he were to start systemic therapy, he would be given 15 fractions. I'll let Dr. Lisbeth Renshaw know he's not planning on a start date as of yet, and we will plan 10 fractions if he can start 12/17.

## 2017-06-21 ENCOUNTER — Ambulatory Visit
Admission: RE | Admit: 2017-06-21 | Discharge: 2017-06-21 | Disposition: A | Discharge status: Home / Self Care | Payer: Medicare HMO | Source: Ambulatory Visit | Attending: Radiation Oncology | Admitting: Radiation Oncology

## 2017-06-21 ENCOUNTER — Ambulatory Visit: Payer: Medicare HMO

## 2017-06-21 DIAGNOSIS — C7951 Secondary malignant neoplasm of bone: Secondary | ICD-10-CM | POA: Diagnosis not present

## 2017-06-21 DIAGNOSIS — Z510 Encounter for antineoplastic radiation therapy: Secondary | ICD-10-CM | POA: Diagnosis not present

## 2017-06-22 ENCOUNTER — Telehealth: Payer: Self-pay | Admitting: Nurse Practitioner

## 2017-06-22 ENCOUNTER — Ambulatory Visit: Payer: Self-pay | Admitting: Emergency Medicine

## 2017-06-22 ENCOUNTER — Other Ambulatory Visit: Payer: Self-pay | Admitting: Radiation Oncology

## 2017-06-22 ENCOUNTER — Other Ambulatory Visit: Payer: Self-pay | Admitting: *Deleted

## 2017-06-22 ENCOUNTER — Ambulatory Visit
Admission: RE | Admit: 2017-06-22 | Discharge: 2017-06-22 | Disposition: A | Discharge status: Home / Self Care | Payer: Medicare HMO | Source: Ambulatory Visit | Attending: Radiation Oncology | Admitting: Radiation Oncology

## 2017-06-22 DIAGNOSIS — C7951 Secondary malignant neoplasm of bone: Secondary | ICD-10-CM | POA: Diagnosis not present

## 2017-06-22 DIAGNOSIS — Z510 Encounter for antineoplastic radiation therapy: Secondary | ICD-10-CM | POA: Diagnosis not present

## 2017-06-22 MED ORDER — OMEPRAZOLE 40 MG PO CPDR: 40.0000 mg | DELAYED_RELEASE_CAPSULE | Freq: Two times a day (BID) | ORAL | 0 refills | Status: DC

## 2017-06-22 MED ORDER — OXYCODONE HCL 5 MG PO TABS: 5.0000 mg | ORAL_TABLET | Freq: Four times a day (QID) | ORAL | 0 refills | Status: DC | PRN

## 2017-06-22 NOTE — Telephone Encounter (Signed)
Rx refilled- patient was seen in October.

## 2017-06-22 NOTE — Telephone Encounter (Signed)
Copied from [place]. Topic: Quick Communication - Rx Refill/Question >> Jun 22, 2017  1:35 PM Yvette Rack wrote: Has the patient contacted their pharmacy? No.   (Agent: If no, request that the patient contact the pharmacy for the refill.)  omeprazole (PRILOSEC) 40 MG capsule  Preferred Pharmacy (with phone number or street name): [id number] [address], [place]. [phone] (Phone) [phone] (Fax)     Agent: Please be advised that RX refills may take up to 3 business days. We ask that you follow-up with your pharmacy.

## 2017-06-23 DIAGNOSIS — C7951 Secondary malignant neoplasm of bone: Secondary | ICD-10-CM | POA: Insufficient documentation

## 2017-06-23 NOTE — Progress Notes (Signed)
  Radiation Oncology         [phone] ________________________________  Name: Trevone Prestwood MRN: [id number]  Date: 05/30/2017  DOB: 01/17/1956  SIMULATION AND TREATMENT PLANNING NOTE  DIAGNOSIS:     ICD-10-CM   1. Bone metastasis ([place]) C79.51      Site:   1. Chest:  Left scapula and T2 2. Right iliac 3. Left proximal femur 4. Right proximal femur  NARRATIVE:  The patient was brought to the [facility].  Identity was confirmed.  All relevant records and images related to the planned course of therapy were reviewed.   Written consent to proceed with treatment was confirmed which was freely given after reviewing the details related to the planned course of therapy had been reviewed with the patient.  Then, the patient was set-up in a stable reproducible  supine position for radiation therapy.  CT images were obtained.  Surface markings were placed.    Medically necessary complex treatment device(s) for immobilization:  Vac-lock bag.   The CT images were loaded into the planning software.  Then the target and avoidance structures were contoured.  Treatment planning then occurred.  The radiation prescription was entered and confirmed.  A total of 9 complex treatment devices were fabricated which relate to the designed radiation treatment fields for each of these target sites. Each of these customized fields/ complex treatment devices will be used on a daily basis during the radiation course. I have requested : 3D Simulation  I have requested a DVH of the following structures: target volume, lungs, spinal cord.   PLAN:  The patient will receive 30 Gy in 10 fractions.  ________________________________   Jodelle Gross, MD, PhD

## 2017-06-23 NOTE — Progress Notes (Signed)
  Radiation Oncology         [phone] ________________________________  Name: Tanner [name] MRN: [id number]  Date: 06/21/2017  DOB: 04-02-1956  SIMULATION AND TREATMENT PLANNING NOTE  DIAGNOSIS:     ICD-10-CM   1. Bone metastasis ([place]) C79.51      Site:  L-spine  NARRATIVE:  The patient was brought to the [facility].  Identity was confirmed.  All relevant records and images related to the planned course of therapy were reviewed.   Written consent to proceed with treatment was confirmed which was freely given after reviewing the details related to the planned course of therapy had been reviewed with the patient.  Then, the patient was set-up in a stable reproducible  supine position for radiation therapy.  CT images were obtained.  Surface markings were placed.    Medically necessary complex treatment device(s) for immobilization:  Customized vac lock bag.   The CT images were loaded into the planning software.  Then the target and avoidance structures were contoured.  Treatment planning then occurred.  The radiation prescription was entered and confirmed.  Complex treatment devices were fabricated which relate to the designed radiation treatment fields for the creation of the patient's tomotherapy 3-D plan. The number of treatment fields will be determined by the tomotherapy sinogram. Each of these customized fields/ complex treatment devices will be used on a daily basis during the radiation course. I have requested : 3D Simulation  I have requested a DVH of the following structures: Target volume, bowel, spinal cord.   PLAN:  The patient will receive 30 Gy in 10 fractions.    Special treatment procedure The treatment area overlaps with previous radiation treatment. This will need to be accounted for with a dose accumulation. This will require extra labor intensive work and therefore constitutes a special treatment procedure due to  reirradiation.  ________________________________   Jodelle Gross, MD, PhD

## 2017-06-25 ENCOUNTER — Ambulatory Visit (HOSPITAL_BASED_OUTPATIENT_CLINIC_OR_DEPARTMENT_OTHER): Payer: Medicare HMO | Admitting: Oncology

## 2017-06-25 ENCOUNTER — Encounter: Payer: Self-pay | Admitting: Oncology

## 2017-06-25 ENCOUNTER — Ambulatory Visit: Payer: Medicare HMO | Admitting: Radiation Oncology

## 2017-06-25 ENCOUNTER — Telehealth: Payer: Self-pay | Admitting: Oncology

## 2017-06-25 VITALS — BP 113/63 | HR 72 | Temp 98.1°F | Resp 16 | Ht 70.0 in | Wt 164.2 lb

## 2017-06-25 DIAGNOSIS — C7951 Secondary malignant neoplasm of bone: Secondary | ICD-10-CM | POA: Diagnosis not present

## 2017-06-25 DIAGNOSIS — C771 Secondary and unspecified malignant neoplasm of intrathoracic lymph nodes: Secondary | ICD-10-CM | POA: Diagnosis not present

## 2017-06-25 DIAGNOSIS — C801 Malignant (primary) neoplasm, unspecified: Secondary | ICD-10-CM

## 2017-06-25 DIAGNOSIS — C61 Malignant neoplasm of prostate: Secondary | ICD-10-CM

## 2017-06-25 NOTE — Telephone Encounter (Signed)
Gave avs and calendar for January  °

## 2017-06-25 NOTE — Progress Notes (Signed)
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  Hepresented with left upper lobe pulmonary nodule in addition to left prevascular lymph node and multiple metastatic bone disease.  PRIOR THERAPY: Palliative radiotherapy to the metastatic bone disease in the right pelvic area completed on 12/142018  CURRENT THERAPY: The patient is scheduled to begin palliative radiation to the lumbar spine on 06/27/2017  INTERVAL HISTORY: Tanner [name] 61 y.o. male returns for routine follow-up visit accompanied by his ex-wife.  Patient completed radiation to the right pelvic area last week.  He still has ongoing pain to his lower back.  He is scheduled to get radiation to this area later this week.  The patient was scheduled to have a CT-guided biopsy last week, but had to cancel due to snow.  This is rescheduled for next week.  The patient denies fevers and chills.  Denies chest pain, shortness of breath, cough, hemoptysis.  Denies nausea, vomiting, constipation, diarrhea.  The patient is here for reevaluation.  MEDICAL HISTORY: Past Medical History:  Diagnosis Date  . Alcohol abuse   . Alcoholic hepatitis   . Allergy   . Anxiety   . 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 C. difficile colitis   . Depression   . DVT (deep venous thrombosis) ([place]) 2015  . GERD (gastroesophageal reflux disease)   . Hypertension   . Ileus ([place]) 09/2013  . 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 Shortness of breath    new onset- occasionally-at rest and exertion  . Substance abuse ([place])   . TIA (transient ischemic attack) 08/2013    ALLERGIES:  is allergic to dilaudid [hydromorphone hcl]; procaine hcl; zofran [ondansetron]; and sulfa antibiotics.  MEDICATIONS:  Current Outpatient Medications   Medication Sig Dispense Refill  . amLODipine (NORVASC) 10 MG tablet Take 1 tablet (10 mg total) daily by mouth. 90 tablet 1  . BEVESPI AEROSPHERE 9-4.8 MCG/ACT AERO     . bifidobacterium infantis (ALIGN) capsule Take 1 capsule daily by mouth.    . diazepam (VALIUM) 5 MG tablet Take 1 tablet (5 mg total) 2 (two) times daily as needed by mouth for anxiety or muscle spasms. 60 tablet 0  . emollient (BIAFINE) cream Apply 1 application topically 2 (two) times daily. Apply to skin areas after rad tx and bedtime daily,nothing 4 hours prior to rad txs    . FLUoxetine (PROZAC) 40 MG capsule TAKE 1 CAPSULE BY MOUTH EVERY DAY 90 capsule 1  . lisinopril (PRINIVIL,ZESTRIL) 40 MG tablet TAKE ONE (1) TABLET BY MOUTH EVERY DAY 90 tablet 1  . loperamide (IMODIUM) 2 MG capsule Take 2 capsules (4 mg total) by mouth every 8 (eight) hours as needed for diarrhea or loose stools. Over the counter 30 capsule 0  . metoprolol tartrate (LOPRESSOR) 100 MG tablet Take 1 tablet (100 mg total) by mouth 2 (two) times daily. 180 tablet 1  . omeprazole (PRILOSEC) 40 MG capsule Take 1 capsule (40 mg total) by mouth 2 (two) times daily. 120 capsule 0  . oxyCODONE (OXY IR/ROXICODONE) 5 MG immediate release tablet Take 1 tablet (5 mg total) by mouth every 6 (six) hours as needed for severe pain. 60 tablet 0  . simvastatin (ZOCOR) 20 MG tablet TAKE ONE (1) TABLET BY MOUTH EVERY DAY  90 tablet 1  . thiamine 100 MG tablet Take 1 tablet (100 mg total) by mouth daily. 30 tablet 3  . traZODone (DESYREL) 50 MG tablet Take 1 tablet (50 mg total) by mouth at bedtime as needed. for sleep 30 tablet 0   No current facility-administered medications for this visit.     SURGICAL HISTORY:  Past Surgical History:  Procedure Laterality Date  . HAND SURGERY Right   . HERNIA REPAIR    . INTRAMEDULLARY (IM) NAIL INTERTROCHANTERIC Right 08/17/2016   Procedure: INTRAMEDULLARY (IM) NAIL RIGHT HIP;  Surgeon: Tania Ade, MD;  Location: [facility];   Service: Orthopedics;  Laterality: Right;  . LYMPHADENECTOMY Bilateral 08/29/2013   Procedure: LYMPHADENECTOMY "BILATERAL PELVIC LYMPH NODE DISSECTION";  Surgeon: Molli Hazard, MD;  Location: [facility];  Service: Urology;  Laterality: Bilateral;  . ORIF ANKLE FRACTURE Right 12/10/2012   Procedure: OPEN REDUCTION INTERNAL FIXATION (ORIF) ANKLE FRACTURE;  Surgeon: Hessie Dibble, MD;  Location: [facility];  Service: Orthopedics;  Laterality: Right;  . ROBOT ASSISTED LAPAROSCOPIC RADICAL PROSTATECTOMY N/A 08/29/2013   Procedure: ROBOTIC ASSISTED LAPAROSCOPIC RADICAL PROSTATECTOMY LEVEL 2, Lysis of adhesions;  Surgeon: Molli Hazard, MD;  Location: [facility];  Service: Urology;  Laterality: N/A;    REVIEW OF SYSTEMS:   Review of Systems  Constitutional: Negative for appetite change, chills, fatigue, fever and unexpected weight change.  HENT:   Negative for mouth sores, nosebleeds, sore throat and trouble swallowing.   Eyes: Negative for eye problems and icterus.  Respiratory: Negative for cough, hemoptysis, shortness of breath and wheezing.   Cardiovascular: Negative for chest pain and leg swelling.  Gastrointestinal: Negative for abdominal pain, constipation, diarrhea, nausea and vomiting.  Genitourinary: Negative for bladder incontinence, difficulty urinating, dysuria, frequency and hematuria.   Musculoskeletal: Negative for gait problem, neck pain and neck stiffness. Positive for low back pain. Skin: Negative for itching and rash.  Neurological: Negative for dizziness, extremity weakness, gait problem, headaches, light-headedness and seizures.  Hematological: Negative for adenopathy. Does not bruise/bleed easily.  Psychiatric/Behavioral: Negative for confusion, depression and sleep disturbance. The patient is not nervous/anxious.     PHYSICAL EXAMINATION:  Blood pressure 113/63, pulse 72, temperature 98.1 F (36.7 C), temperature source Oral, resp. rate 16, height 5\' 10"  (1.778 m),  weight 164 lb 3.2 oz (74.5 kg), SpO2 97 %.  ECOG PERFORMANCE STATUS: 1 - Symptomatic but completely ambulatory  Physical Exam  Constitutional: Oriented to person, place, and time and well-developed, well-nourished, and in no distress. No distress.  HENT:  Head: Normocephalic and atraumatic.  Mouth/Throat: Oropharynx is clear and moist. No oropharyngeal exudate.  Eyes: Conjunctivae are normal. Right eye exhibits no discharge. Left eye exhibits no discharge. No scleral icterus.  Neck: Normal range of motion. Neck supple.  Cardiovascular: Normal rate, regular rhythm, normal heart sounds and intact distal pulses.   Pulmonary/Chest: Effort normal and breath sounds normal. No respiratory distress. No wheezes. No rales.  Abdominal: Soft. Bowel sounds are normal. Exhibits no distension and no mass. There is no tenderness.  Musculoskeletal: Normal range of motion. Exhibits no edema.  Lymphadenopathy:    No cervical adenopathy.  Neurological: Alert and oriented to person, place, and time. Exhibits normal muscle tone. Coordination normal.  Skin: Skin is warm and dry. No rash noted. Not diaphoretic. No erythema. No pallor.  Psychiatric: Mood, memory and judgment normal.  Vitals reviewed.  LABORATORY DATA: Lab Results  Component Value Date   WBC 8.9 06/01/2017   HGB 12.5 (L) 06/01/2017  HCT 37.4 (L) 06/01/2017   MCV 99.2 06/01/2017   PLT 266 06/01/2017      Chemistry      Component Value Date/Time   NA 134 (L) 05/24/2017 1223   K 4.2 05/24/2017 1223   CL 98 05/08/2017 1217   CO2 25 05/24/2017 1223   BUN 10.7 05/24/2017 1223   CREATININE 0.9 05/24/2017 1223      Component Value Date/Time   CALCIUM 9.3 05/24/2017 1223   ALKPHOS 121 05/24/2017 1223   AST 19 05/24/2017 1223   ALT 11 05/24/2017 1223   BILITOT 0.23 05/24/2017 1223       RADIOGRAPHIC STUDIES:  Ct Biopsy  Result Date: 06/01/2017 INDICATION: 61 year old male with a suspicious left upper lung lesion and multiple  bone lesions. Patient also has a history of prostate cancer. Patient needs a tissue diagnosis. EXAM: CT-GUIDED BIOPSY OF RIGHT ILIAC LYTIC BONE LESION MEDICATIONS: None. ANESTHESIA/SEDATION: Moderate (conscious) sedation was employed during this procedure. A total of Versed 2.0 mg and Fentanyl 100 mcg was administered intravenously. Moderate Sedation Time: 26 minutes. The patient's level of consciousness and vital signs were monitored continuously by radiology nursing throughout the procedure under my direct supervision. FLUOROSCOPY TIME:  None COMPLICATIONS: None immediate. PROCEDURE: Informed written consent was obtained from the patient after a thorough discussion of the procedural risks, benefits and alternatives. All questions were addressed. A timeout was performed prior to the initiation of the procedure. Patient was placed in the CT scanner and positioned slightly on his left side. CT images through the pelvis were obtained. The destructive lesion involving the right iliac crest was targeted. Right side of the back was prepped and draped in sterile fashion. Skin and soft tissues were anesthetized with 1% lidocaine. 17 gauge coaxial needle was directed through the partially destroyed posterior cortex of the right iliac bone with CT guidance. Needle was placed within the soft tissue component of the lesion. Four core biopsies were obtained with an 18 gauge device. Specimens placed in formalin. Needle was removed without complication. Bandage placed over the puncture site. FINDINGS: Soft tissue lytic lesion involving the right iliac crest with partial destruction of the posterior cortex. Needle was easily advanced into the soft tissue component and core biopsies were obtained. IMPRESSION: Successful CT-guided core biopsies of the right iliac lytic bone lesion. Electronically Signed   By: Markus Daft M.D.   On: 06/01/2017 09:20     ASSESSMENT/PLAN:   This is a very pleasant 61 year old white male with  metastatic neoplasm of unknown type and primary at this point, suspicious for spindle cell neoplasm. The patient is awaiting a repeat CT-guided biopsy which is scheduled for next week. Recommend that he proceed with palliative radiation to the lumbar spine as scheduled.  The patient will return in approximately 2 weeks for further discussion of his treatment options based on the final pathology. The patient was advised to call immediately if he has any concerning symptoms in the interval. The patient voices understanding of current disease status and treatment options and is in agreement with the current care plan.  All questions were answered. The patient knows to call the clinic with any problems, questions or concerns. We can certainly see the patient much sooner if necessary.  No orders of the defined types were placed in this encounter.  Mikey Bussing, DNP, AGPCNP-BC, AOCNP 06/25/17

## 2017-06-26 ENCOUNTER — Ambulatory Visit: Payer: Medicare HMO

## 2017-06-27 ENCOUNTER — Ambulatory Visit: Payer: Medicare HMO

## 2017-06-27 ENCOUNTER — Ambulatory Visit
Admission: RE | Admit: 2017-06-27 | Discharge: 2017-06-27 | Disposition: A | Discharge status: Home / Self Care | Payer: Medicare HMO | Source: Ambulatory Visit | Attending: Radiation Oncology | Admitting: Radiation Oncology

## 2017-06-27 DIAGNOSIS — C7951 Secondary malignant neoplasm of bone: Secondary | ICD-10-CM | POA: Diagnosis not present

## 2017-06-27 DIAGNOSIS — Z510 Encounter for antineoplastic radiation therapy: Secondary | ICD-10-CM | POA: Diagnosis not present

## 2017-06-28 ENCOUNTER — Ambulatory Visit
Admission: RE | Admit: 2017-06-28 | Discharge: 2017-06-28 | Disposition: A | Discharge status: Home / Self Care | Payer: Medicare HMO | Source: Ambulatory Visit | Attending: Radiation Oncology | Admitting: Radiation Oncology

## 2017-06-28 ENCOUNTER — Ambulatory Visit: Payer: Medicare HMO

## 2017-06-28 DIAGNOSIS — Z510 Encounter for antineoplastic radiation therapy: Secondary | ICD-10-CM | POA: Diagnosis not present

## 2017-06-28 DIAGNOSIS — C7951 Secondary malignant neoplasm of bone: Secondary | ICD-10-CM | POA: Diagnosis not present

## 2017-06-29 ENCOUNTER — Ambulatory Visit
Admission: RE | Admit: 2017-06-29 | Discharge: 2017-06-29 | Disposition: A | Discharge status: Home / Self Care | Payer: Medicare HMO | Source: Ambulatory Visit | Attending: Radiation Oncology | Admitting: Radiation Oncology

## 2017-06-29 ENCOUNTER — Ambulatory Visit: Payer: Medicare HMO

## 2017-06-29 ENCOUNTER — Other Ambulatory Visit: Payer: Self-pay | Admitting: Nurse Practitioner

## 2017-06-29 DIAGNOSIS — Z510 Encounter for antineoplastic radiation therapy: Secondary | ICD-10-CM | POA: Diagnosis not present

## 2017-06-29 DIAGNOSIS — C7951 Secondary malignant neoplasm of bone: Secondary | ICD-10-CM | POA: Diagnosis not present

## 2017-07-02 ENCOUNTER — Other Ambulatory Visit: Payer: Self-pay | Admitting: Radiology

## 2017-07-02 ENCOUNTER — Ambulatory Visit: Payer: Medicare HMO

## 2017-07-02 ENCOUNTER — Ambulatory Visit
Admission: RE | Admit: 2017-07-02 | Discharge: 2017-07-02 | Disposition: A | Discharge status: Home / Self Care | Payer: Medicare HMO | Source: Ambulatory Visit | Attending: Radiation Oncology | Admitting: Radiation Oncology

## 2017-07-02 DIAGNOSIS — Z510 Encounter for antineoplastic radiation therapy: Secondary | ICD-10-CM | POA: Diagnosis not present

## 2017-07-02 DIAGNOSIS — C7951 Secondary malignant neoplasm of bone: Secondary | ICD-10-CM | POA: Diagnosis not present

## 2017-07-04 ENCOUNTER — Ambulatory Visit: Payer: Medicare HMO

## 2017-07-04 ENCOUNTER — Other Ambulatory Visit (INDEPENDENT_AMBULATORY_CARE_PROVIDER_SITE_OTHER): Payer: Medicare HMO

## 2017-07-04 ENCOUNTER — Other Ambulatory Visit: Payer: Self-pay | Admitting: Radiation Oncology

## 2017-07-04 ENCOUNTER — Encounter: Payer: Self-pay | Admitting: Nurse Practitioner

## 2017-07-04 ENCOUNTER — Ambulatory Visit (INDEPENDENT_AMBULATORY_CARE_PROVIDER_SITE_OTHER): Payer: Medicare HMO | Admitting: Nurse Practitioner

## 2017-07-04 ENCOUNTER — Ambulatory Visit
Admission: RE | Admit: 2017-07-04 | Discharge: 2017-07-04 | Disposition: A | Discharge status: Home / Self Care | Payer: Medicare HMO | Source: Ambulatory Visit | Attending: Radiation Oncology | Admitting: Radiation Oncology

## 2017-07-04 VITALS — BP 120/70 | HR 78 | Temp 98.1°F | Ht 70.0 in | Wt 164.0 lb

## 2017-07-04 DIAGNOSIS — C7951 Secondary malignant neoplasm of bone: Secondary | ICD-10-CM | POA: Diagnosis not present

## 2017-07-04 DIAGNOSIS — Z510 Encounter for antineoplastic radiation therapy: Secondary | ICD-10-CM | POA: Diagnosis not present

## 2017-07-04 DIAGNOSIS — R197 Diarrhea, unspecified: Secondary | ICD-10-CM

## 2017-07-04 DIAGNOSIS — R531 Weakness: Secondary | ICD-10-CM

## 2017-07-04 DIAGNOSIS — G8929 Other chronic pain: Secondary | ICD-10-CM | POA: Diagnosis not present

## 2017-07-04 DIAGNOSIS — M545 Low back pain, unspecified: Secondary | ICD-10-CM

## 2017-07-04 LAB — COMPREHENSIVE METABOLIC PANEL
ALT: 12 U/L (ref 0–53)
AST: 16 U/L (ref 0–37)
Albumin: 3.5 g/dL (ref 3.5–5.2)
Alkaline Phosphatase: 82 U/L (ref 39–117)
BILIRUBIN TOTAL: 0.2 mg/dL (ref 0.2–1.2)
BUN: 14 mg/dL (ref 6–23)
CALCIUM: 9.1 mg/dL (ref 8.4–10.5)
CO2: 27 meq/L (ref 19–32)
CREATININE: 0.83 mg/dL (ref 0.40–1.50)
Chloride: 97 mEq/L (ref 96–112)
GFR: 100.77 mL/min (ref >60.00)
GLUCOSE: 97 mg/dL (ref 70–99)
Potassium: 4.4 mEq/L (ref 3.5–5.1)
SODIUM: 132 meq/L — AB (ref 135–145)
Total Protein: 7.1 g/dL (ref 6.0–8.3)

## 2017-07-04 MED ORDER — ONDANSETRON HCL 8 MG PO TABS: 8.0000 mg | ORAL_TABLET | Freq: Three times a day (TID) | ORAL | 3 refills | Status: DC | PRN

## 2017-07-04 MED ORDER — DIAZEPAM 5 MG PO TABS: 5.0000 mg | ORAL_TABLET | Freq: Two times a day (BID) | ORAL | 0 refills | Status: DC | PRN

## 2017-07-04 NOTE — Assessment & Plan Note (Signed)
Medications ordered: - diazepam (VALIUM) 5 MG tablet; Take 1 tablet (5 mg total) by mouth 2 (two) times daily as needed for anxiety or muscle spasms.  Dispense: 60 tablet; Refill: 0 Controlled substance registry reviewed with no irregularities. UDS up to date.

## 2017-07-04 NOTE — Patient Instructions (Signed)
Please head downstairs for lab work.  Please let me know as you need refills on your daily medications, I will be glad to send 6 months for most of your daily medications.  Ill see you back in about 6 months, or sooner if you need me.  It was good to see you. Thanks for letting me take care of you today :)

## 2017-07-04 NOTE — Progress Notes (Signed)
Subjective:    Patient ID: Tanner [name], male    DOB: 02-06-1956, 61 y.o.   MRN: [id number]  HPI Tanner [name] is a 61 yo male who presents today for a follow up visit. Over the past month, he has been given a diagnosis of metastatic cancer, suspicious for spindle cell neoplasm, with metastasis to left upper lung lobe, left prevascular lymph node, and multiple bones. He has been following with oncology group for management and receiving palliative radiotherapy to his pelvis and lumbar spine. He is scheduled for a Ct-guided biopsy tomorrow and will return to oncology to discuss further management options pending his pathology report.   Over the past month since our last visit, he reports feeling increasingly weak and aching all over his body. He feels the body pain is related to his cancer and the radiotherapy. He is currently taking oxycodone 5 IR QID prescribed by oncology and is planning to ask for increased dose or additional pain medication at his next oncology visit. He has also been taking valium BID for tightness and stiffness in his lower back which does provide him with some relief. hes had an improvement in his diarrhea over the past month, taking align probiotics every few days as needed for diarrhea. He reports loose stools about every 3 days now , but no longer daily. He did see GI for a follow up of the diarrhea for instructions to return PRN.  He feels that he is overall managing his new diagnosis well. He says that he does not feel too worried or depressed, but 'just takes things one day at a time. " he has not thought of counseling or support group, but is not really interested in this today. He does bring a family member with him who helps take him to appointments and makes him meals. He requests a refill of his valium today but says that he has no other immediate needs at this time and would like to continue primary care visits every 6 months unless he has an acute problem that he needs help  with.  Review of Systems  See HPI  Past Medical History:  Diagnosis Date  . Alcohol abuse   . Alcoholic hepatitis   . Allergy   . Anxiety   . 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 C. difficile colitis   . Depression   . DVT (deep venous thrombosis) ([place]) 2015  . GERD (gastroesophageal reflux disease)   . Hypertension   . Ileus ([place]) 09/2013  . 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 Shortness of breath    new onset- occasionally-at rest and exertion  . Substance abuse ([place])   . TIA (transient ischemic attack) 08/2013     Social History   Socioeconomic History  . Marital status: Divorced    Spouse name: Not on file  . Number of children: 1  . Years of education: 16  . Highest education level: Not on file  Social Needs  . Financial resource strain: Not on file  . Food insecurity - worry: Not on file  . Food insecurity - inability: Not on file  . Transportation needs - medical: Not on file  . Transportation needs - non-medical: Not on file  Occupational History  . Occupation: disabled    Comment: Fingers, scolisosis,   Tobacco Use  . Smoking status: Former Smoker    Packs/day: 1.50    Years: 30.00    Pack years: 45.00  Types: Cigarettes    Last attempt to quit: 03/09/2017    Years since quitting: 0.3  . Smokeless tobacco: Never Used  Substance and Sexual Activity  . Alcohol use: Yes    Alcohol/week: 3.0 - 6.0 oz    Types: 5 - 10 Cans of beer per week  . Drug use: Yes    Types: Marijuana  . Sexual activity: Not on file  Other Topics Concern  . Not on file  Social History Narrative   Denies    Past Surgical History:  Procedure Laterality Date  . HAND SURGERY Right   . HERNIA REPAIR    . INTRAMEDULLARY (IM) NAIL INTERTROCHANTERIC Right 08/17/2016   Procedure: INTRAMEDULLARY (IM) NAIL RIGHT HIP;  Surgeon: Tania Ade, MD;  Location: [facility];  Service: Orthopedics;  Laterality: Right;  . LYMPHADENECTOMY Bilateral 08/29/2013   Procedure:  LYMPHADENECTOMY "BILATERAL PELVIC LYMPH NODE DISSECTION";  Surgeon: Molli Hazard, MD;  Location[facility];  Service: Urology;  Laterality: Bilateral;  . ORIF ANKLE FRACTURE Right 12/10/2012   Procedure: OPEN REDUCTION INTERNAL FIXATION (ORIF) ANKLE FRACTURE;  Surgeon: Hessie Dibble, MD;  [facility];  Service: Orthopedics;  Laterality: Right;  . ROBOT ASSISTED LAPAROSCOPIC RADICAL PROSTATECTOMY N/A 08/29/2013   Procedure: ROBOTIC ASSISTED LAPAROSCOPIC RADICAL PROSTATECTOMY LEVEL 2, Lysis of adhesions;  Surgeon: Molli Hazard, MD;  Location[facility];  Service: Urology;  Laterality: N/A;    Family History  Problem Relation Age of Onset  . Lung cancer Father        smoker  . COPD Father   . Emphysema Father   . Arthritis Mother   . Pancreatic cancer Sister     Allergies  Allergen Reactions  . Dilaudid [Hydromorphone Hcl] Nausea And Vomiting    Pre pt history  . Procaine Hcl Other (See Comments)    Patient states he is allergic to novicaine  . Zofran [Ondansetron] Nausea And Vomiting  . Sulfa Antibiotics Rash    Current Outpatient Medications on File Prior to Visit  Medication Sig Dispense Refill  . amLODipine (NORVASC) 10 MG tablet Take 1 tablet (10 mg total) daily by mouth. 90 tablet 1  . bifidobacterium infantis (ALIGN) capsule Take 1 capsule daily by mouth.    . diazepam (VALIUM) 5 MG tablet Take 1 tablet (5 mg total) 2 (two) times daily as needed by mouth for anxiety or muscle spasms. 60 tablet 0  . emollient (BIAFINE) cream Apply 1 application topically 2 (two) times daily. Apply to skin areas after rad tx and bedtime daily,nothing 4 hours prior to rad txs    . FLUoxetine (PROZAC) 40 MG capsule TAKE 1 CAPSULE BY MOUTH EVERY DAY 90 capsule 1  . lisinopril (PRINIVIL,ZESTRIL) 40 MG tablet TAKE ONE (1) TABLET BY MOUTH EVERY DAY 90 tablet 1  . loperamide (IMODIUM) 2 MG capsule Take 2 capsules (4 mg total) by mouth every 8 (eight) hours as needed for diarrhea or  loose stools. Over the counter 30 capsule 0  . metoprolol tartrate (LOPRESSOR) 100 MG tablet Take 1 tablet (100 mg total) by mouth 2 (two) times daily. 180 tablet 1  . omeprazole (PRILOSEC) 40 MG capsule Take 1 capsule (40 mg total) by mouth 2 (two) times daily. 120 capsule 0  . oxyCODONE (OXY IR/ROXICODONE) 5 MG immediate release tablet Take 1 tablet (5 mg total) by mouth every 6 (six) hours as needed for severe pain. 60 tablet 0  . simvastatin (ZOCOR) 20 MG tablet TAKE ONE (1) TABLET BY  MOUTH EVERY DAY 90 tablet 1  . thiamine 100 MG tablet Take 1 tablet (100 mg total) by mouth daily. 30 tablet 3  . traZODone (DESYREL) 50 MG tablet Take 1 tablet (50 mg total) by mouth at bedtime as needed. for sleep 30 tablet 0   No current facility-administered medications on file prior to visit.     BP 120/70   Pulse 78   Temp 98.1 F (36.7 C) (Oral)   Ht 5\' 10"  (1.778 m)   Wt 164 lb (74.4 kg)   SpO2 97%   BMI 23.53 kg/m       Objective:   Physical Exam  Constitutional: He is oriented to person, place, and time. He appears well-developed and well-nourished. No distress.  In wheelchair  HENT:  Head: Normocephalic and atraumatic.  Cardiovascular: Normal rate, regular rhythm and intact distal pulses.  Pulmonary/Chest: Effort normal. No respiratory distress.  Abdominal: Soft. Bowel sounds are normal. He exhibits no distension.  Musculoskeletal: He exhibits no edema.  Neurological: He is alert and oriented to person, place, and time. Coordination normal.  Strength equal bilaterally  Skin: Skin is warm and dry.  Psychiatric: He has a normal mood and affect. Judgment and thought content normal.  Vitals reviewed.     Assessment & Plan:  RTC in 6 months.  Weakness This is likely related to his metastatic illness. Will check basic labs today. Diagnostic testing ordered: - CBC w/Diff - Comprehensive metabolic panel; Future   Diarrhea, unspecified type Improving; recent visit with GI Will  check labs for worsening anemia, dehydration Diagnostic testing ordered: - CBC w/Diff - Comprehensive metabolic panel; Future

## 2017-07-05 ENCOUNTER — Encounter (HOSPITAL_COMMUNITY): Payer: Self-pay

## 2017-07-05 ENCOUNTER — Ambulatory Visit: Payer: Medicare HMO

## 2017-07-05 ENCOUNTER — Ambulatory Visit
Admission: RE | Admit: 2017-07-05 | Discharge: 2017-07-05 | Disposition: A | Discharge status: Home / Self Care | Payer: Medicare HMO | Source: Ambulatory Visit | Attending: Radiation Oncology | Admitting: Radiation Oncology

## 2017-07-05 ENCOUNTER — Ambulatory Visit (HOSPITAL_COMMUNITY)
Admission: RE | Admit: 2017-07-05 | Discharge: 2017-07-05 | Disposition: A | Discharge status: Home / Self Care | Payer: Medicare HMO | Source: Ambulatory Visit | Attending: Internal Medicine | Admitting: Internal Medicine

## 2017-07-05 DIAGNOSIS — Z8673 Personal history of transient ischemic attack (TIA), and cerebral infarction without residual deficits: Secondary | ICD-10-CM | POA: Diagnosis not present

## 2017-07-05 DIAGNOSIS — Z882 Allergy status to sulfonamides status: Secondary | ICD-10-CM | POA: Diagnosis not present

## 2017-07-05 DIAGNOSIS — F419 Anxiety disorder, unspecified: Secondary | ICD-10-CM | POA: Insufficient documentation

## 2017-07-05 DIAGNOSIS — R69 Illness, unspecified: Secondary | ICD-10-CM | POA: Diagnosis not present

## 2017-07-05 DIAGNOSIS — Z510 Encounter for antineoplastic radiation therapy: Secondary | ICD-10-CM | POA: Diagnosis not present

## 2017-07-05 DIAGNOSIS — K219 Gastro-esophageal reflux disease without esophagitis: Secondary | ICD-10-CM | POA: Diagnosis not present

## 2017-07-05 DIAGNOSIS — C7951 Secondary malignant neoplasm of bone: Secondary | ICD-10-CM | POA: Diagnosis not present

## 2017-07-05 DIAGNOSIS — F329 Major depressive disorder, single episode, unspecified: Secondary | ICD-10-CM | POA: Insufficient documentation

## 2017-07-05 DIAGNOSIS — K7010 Alcoholic hepatitis without ascites: Secondary | ICD-10-CM | POA: Diagnosis not present

## 2017-07-05 DIAGNOSIS — Z885 Allergy status to narcotic agent status: Secondary | ICD-10-CM | POA: Insufficient documentation

## 2017-07-05 DIAGNOSIS — C419 Malignant neoplasm of bone and articular cartilage, unspecified: Secondary | ICD-10-CM | POA: Insufficient documentation

## 2017-07-05 DIAGNOSIS — C414 Malignant neoplasm of pelvic bones, sacrum and coccyx: Secondary | ICD-10-CM | POA: Diagnosis not present

## 2017-07-05 DIAGNOSIS — Z87891 Personal history of nicotine dependence: Secondary | ICD-10-CM | POA: Insufficient documentation

## 2017-07-05 DIAGNOSIS — I10 Essential (primary) hypertension: Secondary | ICD-10-CM | POA: Diagnosis not present

## 2017-07-05 DIAGNOSIS — Z8261 Family history of arthritis: Secondary | ICD-10-CM | POA: Diagnosis not present

## 2017-07-05 DIAGNOSIS — Z86718 Personal history of other venous thrombosis and embolism: Secondary | ICD-10-CM | POA: Diagnosis not present

## 2017-07-05 DIAGNOSIS — M899 Disorder of bone, unspecified: Secondary | ICD-10-CM | POA: Diagnosis not present

## 2017-07-05 DIAGNOSIS — Z8546 Personal history of malignant neoplasm of prostate: Secondary | ICD-10-CM | POA: Diagnosis not present

## 2017-07-05 DIAGNOSIS — R918 Other nonspecific abnormal finding of lung field: Secondary | ICD-10-CM

## 2017-07-05 DIAGNOSIS — Z79899 Other long term (current) drug therapy: Secondary | ICD-10-CM | POA: Diagnosis not present

## 2017-07-05 DIAGNOSIS — M1990 Unspecified osteoarthritis, unspecified site: Secondary | ICD-10-CM | POA: Insufficient documentation

## 2017-07-05 DIAGNOSIS — Z825 Family history of asthma and other chronic lower respiratory diseases: Secondary | ICD-10-CM | POA: Insufficient documentation

## 2017-07-05 DIAGNOSIS — Z801 Family history of malignant neoplasm of trachea, bronchus and lung: Secondary | ICD-10-CM | POA: Insufficient documentation

## 2017-07-05 DIAGNOSIS — Z800 Family history of malignant neoplasm of digestive organs: Secondary | ICD-10-CM | POA: Diagnosis not present

## 2017-07-05 LAB — CBC
HEMATOCRIT: 33.4 % — AB (ref 39.0–52.0)
Hemoglobin: 11.1 g/dL — ABNORMAL LOW (ref 13.0–17.0)
MCH: 33.9 pg (ref 26.0–34.0)
MCHC: 33.2 g/dL (ref 30.0–36.0)
MCV: 102.1 fL — AB (ref 78.0–100.0)
Platelets: 175 10*3/uL (ref 150–400)
RBC: 3.27 MIL/uL — AB (ref 4.22–5.81)
RDW: 13.6 % (ref 11.5–15.5)
WBC: 4.6 10*3/uL (ref 4.0–10.5)

## 2017-07-05 LAB — APTT: aPTT: 31 seconds (ref 24–36)

## 2017-07-05 LAB — PROTIME-INR
INR: 0.97
Prothrombin Time: 12.8 seconds (ref 11.4–15.2)

## 2017-07-05 MED ORDER — FENTANYL CITRATE (PF) 100 MCG/2ML IJ SOLN
INTRAMUSCULAR | Status: AC | PRN
  Administered 2017-07-05 (×2): 50 ug via INTRAVENOUS

## 2017-07-05 MED ORDER — ONDANSETRON HCL 4 MG/2ML IJ SOLN
4.0000 mg | Freq: Once | INTRAMUSCULAR | Status: AC
Start: 2017-07-05 — End: 2017-07-05
  Administered 2017-07-05: 4 mg via INTRAVENOUS
  Filled 2017-07-05: qty 2

## 2017-07-05 MED ORDER — FENTANYL CITRATE (PF) 100 MCG/2ML IJ SOLN
INTRAMUSCULAR | Status: AC
  Filled 2017-07-05: qty 4

## 2017-07-05 MED ORDER — MIDAZOLAM HCL 2 MG/2ML IJ SOLN
INTRAMUSCULAR | Status: AC
  Filled 2017-07-05: qty 6

## 2017-07-05 MED ORDER — LIDOCAINE HCL 1 % IJ SOLN
INTRAMUSCULAR | Status: AC
  Filled 2017-07-05: qty 20

## 2017-07-05 MED ORDER — MIDAZOLAM HCL 2 MG/2ML IJ SOLN
INTRAMUSCULAR | Status: AC | PRN
  Administered 2017-07-05 (×2): 1 mg via INTRAVENOUS

## 2017-07-05 MED ORDER — OXYCODONE HCL 5 MG PO TABS
5.0000 mg | ORAL_TABLET | Freq: Once | ORAL | Status: AC
  Administered 2017-07-05: 5 mg via ORAL
  Filled 2017-07-05: qty 1

## 2017-07-05 MED ORDER — ONDANSETRON HCL 4 MG/2ML IJ SOLN
INTRAMUSCULAR | Status: AC
  Filled 2017-07-05: qty 2

## 2017-07-05 MED ORDER — OXYCODONE HCL 5 MG PO TABS
ORAL_TABLET | ORAL | Status: AC
  Filled 2017-07-05: qty 1

## 2017-07-05 MED ORDER — SODIUM CHLORIDE 0.9 % IV SOLN
INTRAVENOUS | Status: DC
  Administered 2017-07-05: 12:00:00 via INTRAVENOUS

## 2017-07-05 MED ORDER — ONDANSETRON HCL 4 MG/2ML IJ SOLN: 4.0000 mg | Freq: Once | INTRAMUSCULAR | Status: DC

## 2017-07-05 NOTE — Sedation Documentation (Signed)
Patient is resting comfortably. 

## 2017-07-05 NOTE — Procedures (Signed)
Osseous mets  S/p RT ILIAC BONE LESION CORE BX  NO COMP STABLE PATH PENDING EBL 0 FULL REPORT IN PACS

## 2017-07-05 NOTE — Sedation Documentation (Signed)
Patient denies pain and is resting comfortably.  

## 2017-07-05 NOTE — Discharge Instructions (Signed)
Biopsy Discharge Instructions ° °The procedure you just had is called a biopsy.  You may feel some discomfort after the local anesthetic wears off.  Your discomfort should improve over the next several days. ° °AFTER YOUR BIOPSY °· Rest for the remainder of the day. °· Avoid heavy lifting (more than 10 lb/4.5 kg). °· If you have been given a general anesthetic or other medications to help you relax, you should not operate machinery, drive or make legal decisions for 24 hours after your procedure.  Additionally, someone must be available to drive you home. °· Only take over-the-counter or prescription medicines for pain, discomfort, or fever as directed by your caregiver.  This can make bleeding worse. °· You may resume your usual diet after the procedure. °· Avoid alcoholic beverages for 24 hours after your procedure. °· Keep the skin around your biopsy site clean and dry. °· You may shower after 24 hours.  Cleanse and dry the biopsy site completely after you shower.  Avoid baths and swimming for 72 hours. ° °Complications are very uncommon after this procedure.  Go to the nearest Emergency Department or contact your caregiver if you develop any of the following symptoms: °· Worsening pain °· Bleeding °· Swelling at the biopsy site °· Light headedness or dizziness °· Shortness of Breath °· Fever or chills °Redness or increased pain or swelling at the biopsy site   Moderate Conscious Sedation, Adult, Care After °These instructions provide you with information about caring for yourself after your procedure. Your health care provider may also give you more specific instructions. Your treatment has been planned according to current medical practices, but problems sometimes occur. Call your health care provider if you have any problems or questions after your procedure. °What can I expect after the procedure? °After your procedure, it is common: °· To feel sleepy for several hours. °· To feel clumsy and have poor balance  for several hours. °· To have poor judgment for several hours. °· To vomit if you eat too soon. ° °Follow these instructions at home: °For at least 24 hours after the procedure: ° °· Do not: °? Participate in activities where you could fall or become injured. °? Drive. °? Use heavy machinery. °? Drink alcohol. °? Take sleeping pills or medicines that cause drowsiness. °? Make important decisions or sign legal documents. °? Take care of children on your own. °· Rest. °Eating and drinking °· Follow the diet recommended by your health care provider. °· If you vomit: °? Drink water, juice, or soup when you can drink without vomiting. °? Make sure you have little or no nausea before eating solid foods. °General instructions °· Have a responsible adult stay with you until you are awake and alert. °· Take over-the-counter and prescription medicines only as told by your health care provider. °· If you smoke, do not smoke without supervision. °· Keep all follow-up visits as told by your health care provider. This is important. °Contact a health care provider if: °· You keep feeling nauseous or you keep vomiting. °· You feel light-headed. °· You develop a rash. °· You have a fever. °Get help right away if: °· You have trouble breathing. °This information is not intended to replace advice given to you by your health care provider. Make sure you discuss any questions you have with your health care provider. °Document Released: 04/16/2013 Document Revised: 11/29/2015 Document Reviewed: 10/16/2015 °Elsevier Interactive Patient Education © 2018 [employer] ° °

## 2017-07-05 NOTE — H&P (Signed)
Chief Complaint: Patient was seen in consultation today for right iliac bone lesion bx at the request of [facility]  Referring Physician(s): Mohamed,Mohamed  Supervising Physician: Daryll Brod  Patient Status: Tanner [name]  History of Present Illness: Tanner [name] is a 61 y.o. male   11/23 note: Hx prostate Ca 2014 Etoh hepatitis Smoker (quit 02/2017) Admitted to [facility] 04/2017 for N/V; weakness; dehydration Work up revealed LUL lesion CT: IMPRESSION: 1. No evidence of central pulmonary embolus. 2. Scattered nodular opacities throughout both lungs. Suggestion of a miliary pattern, though some of the opacities are more diffuse. This is concerning for atypical pneumonia. Fungal pneumonia or viral pneumonitis cannot be excluded. Tuberculosis is considered less likely. Would correlate with the patient's symptoms. 3. Spiculated left upper lobe pulmonary nodule abutting the anterior aspect of the mediastinum, new from 2017. This measures 2.2 x 2.1 x 1.4 cm, and is concerning for primary bronchogenic malignancy. Tissue diagnosis and/or PET/CT would be helpful for further evaluation. 4. Question of lytic lesions within vertebral bodies T1 and T2, apparently new from 2017. This could be further assessed on bone scan or the PET/CT, as deemed clinically appropriate.  PET 05/11/17: IMPRESSION: 1. Hypermetabolic left upper lobe lung lesion consistent with primary lung neoplasm. There is also a metabolically active left prevascular lymph node. 2. Diffuse lytic osseous metastatic disease. 3. No findings for pulmonary metastatic disease or abdominal/pelvic metastatic disease.  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 This case also reviewed by Dr. Saralyn Pilar and agree.  Now scheduled for repeat biopsy for additional sample   Past Medical History:  Diagnosis Date  . Alcohol abuse   . Alcoholic hepatitis   . Allergy   . Anxiety   . 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 C. difficile colitis   . Depression   . DVT (deep venous thrombosis) ([place]) 2015  . GERD (gastroesophageal reflux disease)   . Hypertension   . Ileus ([place]) 09/2013  . 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 Shortness of breath    new onset- occasionally-at rest and exertion  . Substance abuse ([place])   . TIA (transient ischemic attack) 08/2013    Past Surgical History:  Procedure Laterality Date  . HAND SURGERY Right   . HERNIA REPAIR    . INTRAMEDULLARY (IM) NAIL INTERTROCHANTERIC Right 08/17/2016   Procedure: INTRAMEDULLARY (IM) NAIL RIGHT HIP;  Surgeon: Tania Ade, MD;  Location: [facility];  Service: Orthopedics;  Laterality: Right;  . LYMPHADENECTOMY Bilateral 08/29/2013   Procedure: LYMPHADENECTOMY "BILATERAL PELVIC LYMPH NODE DISSECTION";  Surgeon: Molli [facility], MD;  Location: [facility];  Service: Urology;  Laterality: Bilateral;  . ORIF ANKLE FRACTURE Right 12/10/2012   Procedure: OPEN REDUCTION INTERNAL FIXATION (ORIF) ANKLE FRACTURE;  Surgeon: Hessie Dibble, MD;  Location: [facility];  Service: Orthopedics;  Laterality: Right;  . ROBOT ASSISTED LAPAROSCOPIC RADICAL PROSTATECTOMY N/A 08/29/2013   Procedure: ROBOTIC ASSISTED LAPAROSCOPIC RADICAL PROSTATECTOMY LEVEL 2, Lysis of adhesions;  Surgeon: Molli [facility], MD;  Location: [facility];  Service: Urology;  Laterality: N/A;    Allergies: Dilaudid [hydromorphone hcl]; Procaine hcl; and Sulfa antibiotics  Medications: Prior to Admission medications   Medication Sig Start Date End Date Taking? Authorizing Provider    amLODipine (NORVASC) 10 MG tablet Take 1 tablet (10 mg total) daily by mouth. 05/17/17  Yes [name], NP  bifidobacterium infantis (ALIGN) capsule Take 1 capsule daily by mouth.   Yes [provider]  emollient (BIAFINE) cream Apply 1 application topically 2 (two) times daily. Apply to skin areas after rad tx and bedtime daily,nothing 4 hours prior to rad txs 06/11/17  Yes Hayden Pedro, PA-C  FLUoxetine (PROZAC) 40 MG capsule TAKE 1 CAPSULE BY MOUTH EVERY DAY 05/21/17  Yes Shambley, Delphia Grates, NP  lisinopril (PRINIVIL,ZESTRIL) 40 MG tablet TAKE ONE (1) TABLET BY MOUTH EVERY DAY 05/21/17  Yes [name], NP  loperamide (IMODIUM) 2 MG capsule Take 2 capsules (4 mg total) by mouth every 8 (eight) hours as needed for diarrhea or loose stools. Over the counter 05/05/17  Yes Rai, Ripudeep K, MD  metoprolol tartrate (LOPRESSOR) 100 MG tablet Take 1 tablet (100 mg total) by mouth 2 (two) times daily. 05/11/17  Yes [name], NP  omeprazole (PRILOSEC) 40 MG capsule Take 1 capsule (40 mg total) by mouth 2 (two) times daily. 06/22/17  Yes [name], NP  oxyCODONE (OXY IR/ROXICODONE) 5 MG immediate release tablet Take 1 tablet (5 mg total) by mouth every 6 (six) hours as needed for severe pain. 06/22/17  Yes Kyung Rudd, MD  simvastatin (ZOCOR) 20 MG tablet TAKE ONE (1) TABLET BY MOUTH EVERY DAY 03/14/17  Yes [name], [name]  thiamine 100 MG tablet Take 1 tablet (100 mg total) by mouth daily. 05/05/17  Yes Rai, Ripudeep K, MD  traZODone (DESYREL) 50 MG tablet Take 1 tablet (50 mg total) by mouth at bedtime as needed. for sleep 05/11/17  Yes Shambley, Delphia Grates, NP  diazepam (VALIUM) 5 MG tablet Take 1 tablet (5 mg total) by mouth 2 (two) times daily as needed for anxiety or muscle spasms. 07/04/17 07/04/18  [name], NP  ondansetron (ZOFRAN) 8 MG tablet Take 1 tablet (8 mg total) by mouth every 8 (eight) hours as needed for nausea or  vomiting. 07/04/17   Hayden Pedro, PA-C     Family History  Problem Relation Age of Onset  . Lung cancer Father        smoker  . COPD Father   . Emphysema Father   . Arthritis Mother   . Pancreatic cancer Sister     Social History   Socioeconomic History  . Marital status: Divorced    Spouse name: None  . Number of children: 1  . Years of education: 40  . Highest education level: None  Social Needs  . Financial resource strain: None  . Food insecurity - worry: None  . Food insecurity - inability: None  . Transportation needs - medical: None  . Transportation needs - non-medical: None  Occupational History  . Occupation: disabled    Comment: Fingers, scolisosis,   Tobacco Use  . Smoking status: Former Smoker    Packs/day: 1.50    Years: 30.00    Pack years: 45.00    Types: Cigarettes    Last attempt to quit: 03/09/2017    Years since quitting: 0.3  . Smokeless tobacco: Never Used  Substance and Sexual Activity  . Alcohol use: Yes    Alcohol/week: 3.0 - 6.0 oz    Types: 5 - 10 Cans of beer per week  .  Drug use: Yes    Types: Marijuana  . Sexual activity: None  Other Topics Concern  . None  Social History Narrative   Denies    Review of Systems: A 12 point ROS discussed and pertinent positives are indicated in the HPI above.  All other systems are negative.  Review of Systems  Constitutional: Positive for activity change and fatigue. Negative for fever.  Respiratory: Negative for cough and shortness of breath.   Musculoskeletal: Positive for gait problem.  Neurological: Positive for weakness.  Psychiatric/Behavioral: Negative for behavioral problems and confusion.    Vital Signs: BP (!) 145/84 (BP Location: Right Arm)   Pulse 65   Temp 98.3 F (36.8 C) (Oral)   Ht 5\' 10"  (1.778 m)   Wt 164 lb (74.4 kg)   SpO2 100%   BMI 23.53 kg/m   Physical Exam  Constitutional: He is oriented to person, place, and time.  Cardiovascular: Normal rate,  regular rhythm and normal heart sounds.  Pulmonary/Chest: Effort normal and breath sounds normal.  Abdominal: Soft. Bowel sounds are normal.  Musculoskeletal: Normal range of motion.  Neurological: He is alert and oriented to person, place, and time.  Skin: Skin is warm and dry.  Psychiatric: He has a normal mood and affect. His behavior is normal. Judgment and thought content normal.  Nursing note and vitals reviewed.   Imaging: No results found.  Labs:  CBC: Recent Labs    05/05/17 0556 05/08/17 1217 05/24/17 1223 06/01/17 0600  WBC 5.7 9.1 7.3 8.9  HGB 12.2* 13.6 12.5* 12.5*  HCT 35.5* 40.6 37.5* 37.4*  PLT 203 334.0 234 266    COAGS: Recent Labs    08/17/16 0001 06/01/17 0600  INR 0.98 0.97  APTT  --  32    BMP: Recent Labs    05/03/17 0818 05/03/17 1231 05/04/17 0501 05/05/17 0556 05/08/17 1217 05/24/17 1223 07/04/17 1043  NA 124* 126* 129* 133* 132* 134* 132*  K 3.7 3.6 3.4* 3.4* 5.2* 4.2 4.4  CL 90* 93* 91* 96* 98  --  97  CO2 23 23 28 26 25 25 27   GLUCOSE 105* 97 100* 101* 96 92 97  BUN 7 5* <5* <5* 16 10.7 14  CALCIUM 8.0* 8.4* 9.2 9.0 10.0 9.3 9.1  CREATININE 0.72 0.73 0.68 0.62 1.24 0.9 0.83  GFRNONAA >60 >60 >60 >60  --   --   --   GFRAA >60 >60 >60 >60  --   --   --     LIVER FUNCTION TESTS: Recent Labs    08/17/16 0001 05/24/17 1223 07/04/17 1043  BILITOT 0.5 0.23 0.2  AST 40 19 16  ALT 34 11 12  ALKPHOS 51 121 82  PROT 6.1* 7.5 7.1  ALBUMIN 3.1* 3.2* 3.5    TUMOR MARKERS: No results for input(s): AFPTM, CEA, CA199, CHROMGRNA in the last 8760 hours.  Assessment and Plan:  Right iliac lesion biopsy repeat today 11/23 bx: need additional sample Now scheduled for repeat Risks and benefits discussed with the patient including, but not limited to bleeding, infection, damage to adjacent structures or low yield requiring additional tests. All of the patient's questions were answered, patient is agreeable to proceed. Consent  signed and in chart.  Thank you for this interesting consult.  I greatly enjoyed meeting Tanner [name] and look forward to participating in their care.  A copy of this report was sent to the requesting provider on this date.  Electronically Signed: Lavonia Drafts, PA-C  07/05/2017, 10:22 AM   I spent a total of    25 Minutes in face to face in clinical consultation, greater than 50% of which was counseling/coordinating care for repeat rt iliac lesion bx

## 2017-07-06 ENCOUNTER — Ambulatory Visit: Payer: Medicare HMO

## 2017-07-06 ENCOUNTER — Telehealth: Payer: Self-pay | Admitting: *Deleted

## 2017-07-06 ENCOUNTER — Ambulatory Visit
Admission: RE | Admit: 2017-07-06 | Discharge: 2017-07-06 | Disposition: A | Discharge status: Home / Self Care | Payer: Medicare HMO | Source: Ambulatory Visit | Attending: Radiation Oncology | Admitting: Radiation Oncology

## 2017-07-06 DIAGNOSIS — C7951 Secondary malignant neoplasm of bone: Secondary | ICD-10-CM | POA: Diagnosis not present

## 2017-07-06 DIAGNOSIS — Z510 Encounter for antineoplastic radiation therapy: Secondary | ICD-10-CM | POA: Diagnosis not present

## 2017-07-06 MED ORDER — MORPHINE SULFATE ER 15 MG PO TBCR: 15.0000 mg | EXTENDED_RELEASE_TABLET | Freq: Two times a day (BID) | ORAL | 0 refills | Status: DC

## 2017-07-06 MED ORDER — OXYCODONE HCL 5 MG PO TABS: 5.0000 mg | ORAL_TABLET | Freq: Four times a day (QID) | ORAL | 0 refills | Status: DC | PRN

## 2017-07-06 MED ORDER — OXYCODONE HCL ER 10 MG PO T12A: 10.0000 mg | EXTENDED_RELEASE_TABLET | Freq: Two times a day (BID) | ORAL | 0 refills | Status: DC

## 2017-07-06 NOTE — Telephone Encounter (Signed)
Spoke with patient about rx Oxycotin er  not covered by insurance would cost himn $168.00, Dr. Lisbeth Renshaw to prescribe another pain for the pataient, asked patient to call if this isn't covered as well,patient thanked me for the call 3:12 PM

## 2017-07-06 NOTE — Telephone Encounter (Signed)
Called [facility] pharmacy spoke with Alison,pharmacist, patient's insurance will not cover the OxyContin er,10mg  q 12 hours, notified Dr.moody, Dr. Lisbeth Renshaw to write a different rx for the patient 3:09 PM'

## 2017-07-09 ENCOUNTER — Ambulatory Visit
Admission: RE | Admit: 2017-07-09 | Discharge: 2017-07-09 | Disposition: A | Discharge status: Home / Self Care | Payer: Medicare HMO | Source: Ambulatory Visit | Attending: Radiation Oncology | Admitting: Radiation Oncology

## 2017-07-09 ENCOUNTER — Ambulatory Visit: Payer: Medicare HMO

## 2017-07-09 DIAGNOSIS — Z510 Encounter for antineoplastic radiation therapy: Secondary | ICD-10-CM | POA: Diagnosis not present

## 2017-07-09 DIAGNOSIS — C7951 Secondary malignant neoplasm of bone: Secondary | ICD-10-CM | POA: Diagnosis not present

## 2017-07-11 ENCOUNTER — Ambulatory Visit
Admission: RE | Admit: 2017-07-11 | Discharge: 2017-07-11 | Disposition: A | Discharge status: Home / Self Care | Payer: Medicare HMO | Source: Ambulatory Visit | Attending: Radiation Oncology | Admitting: Radiation Oncology

## 2017-07-11 ENCOUNTER — Ambulatory Visit: Payer: Medicare HMO

## 2017-07-11 DIAGNOSIS — C7951 Secondary malignant neoplasm of bone: Secondary | ICD-10-CM | POA: Diagnosis not present

## 2017-07-11 DIAGNOSIS — Z510 Encounter for antineoplastic radiation therapy: Secondary | ICD-10-CM | POA: Diagnosis not present

## 2017-07-12 ENCOUNTER — Ambulatory Visit: Payer: Medicare HMO

## 2017-07-12 ENCOUNTER — Ambulatory Visit
Admission: RE | Admit: 2017-07-12 | Discharge: 2017-07-12 | Disposition: A | Discharge status: Home / Self Care | Payer: Medicare HMO | Source: Ambulatory Visit | Attending: Radiation Oncology | Admitting: Radiation Oncology

## 2017-07-12 DIAGNOSIS — C7951 Secondary malignant neoplasm of bone: Secondary | ICD-10-CM | POA: Diagnosis not present

## 2017-07-12 DIAGNOSIS — Z510 Encounter for antineoplastic radiation therapy: Secondary | ICD-10-CM | POA: Diagnosis not present

## 2017-07-13 ENCOUNTER — Ambulatory Visit: Payer: Medicare HMO

## 2017-07-16 ENCOUNTER — Encounter: Payer: Self-pay | Admitting: Adult Health

## 2017-07-16 ENCOUNTER — Other Ambulatory Visit: Payer: Self-pay | Admitting: *Deleted

## 2017-07-16 ENCOUNTER — Inpatient Hospital Stay: Payer: Medicare HMO | Attending: Adult Health | Admitting: Adult Health

## 2017-07-16 ENCOUNTER — Telehealth: Payer: Self-pay | Admitting: Adult Health

## 2017-07-16 ENCOUNTER — Ambulatory Visit: Payer: Medicare HMO

## 2017-07-16 VITALS — BP 115/73 | HR 80 | Temp 98.2°F | Resp 18 | Ht 70.0 in | Wt 160.2 lb

## 2017-07-16 DIAGNOSIS — M545 Low back pain: Secondary | ICD-10-CM | POA: Insufficient documentation

## 2017-07-16 DIAGNOSIS — K469 Unspecified abdominal hernia without obstruction or gangrene: Secondary | ICD-10-CM | POA: Diagnosis not present

## 2017-07-16 DIAGNOSIS — E785 Hyperlipidemia, unspecified: Secondary | ICD-10-CM | POA: Insufficient documentation

## 2017-07-16 DIAGNOSIS — R0609 Other forms of dyspnea: Secondary | ICD-10-CM | POA: Diagnosis not present

## 2017-07-16 DIAGNOSIS — R531 Weakness: Secondary | ICD-10-CM | POA: Insufficient documentation

## 2017-07-16 DIAGNOSIS — R5383 Other fatigue: Secondary | ICD-10-CM | POA: Diagnosis not present

## 2017-07-16 DIAGNOSIS — C3412 Malignant neoplasm of upper lobe, left bronchus or lung: Secondary | ICD-10-CM | POA: Diagnosis not present

## 2017-07-16 DIAGNOSIS — I10 Essential (primary) hypertension: Secondary | ICD-10-CM | POA: Diagnosis not present

## 2017-07-16 DIAGNOSIS — C771 Secondary and unspecified malignant neoplasm of intrathoracic lymph nodes: Secondary | ICD-10-CM | POA: Diagnosis not present

## 2017-07-16 DIAGNOSIS — K219 Gastro-esophageal reflux disease without esophagitis: Secondary | ICD-10-CM | POA: Insufficient documentation

## 2017-07-16 DIAGNOSIS — R269 Unspecified abnormalities of gait and mobility: Secondary | ICD-10-CM | POA: Diagnosis not present

## 2017-07-16 DIAGNOSIS — G893 Neoplasm related pain (acute) (chronic): Secondary | ICD-10-CM | POA: Insufficient documentation

## 2017-07-16 DIAGNOSIS — M791 Myalgia, unspecified site: Secondary | ICD-10-CM | POA: Insufficient documentation

## 2017-07-16 DIAGNOSIS — M439 Deforming dorsopathy, unspecified: Secondary | ICD-10-CM | POA: Diagnosis not present

## 2017-07-16 DIAGNOSIS — Z7189 Other specified counseling: Secondary | ICD-10-CM

## 2017-07-16 DIAGNOSIS — Z800 Family history of malignant neoplasm of digestive organs: Secondary | ICD-10-CM | POA: Insufficient documentation

## 2017-07-16 DIAGNOSIS — C7951 Secondary malignant neoplasm of bone: Secondary | ICD-10-CM | POA: Diagnosis not present

## 2017-07-16 DIAGNOSIS — M25559 Pain in unspecified hip: Secondary | ICD-10-CM | POA: Insufficient documentation

## 2017-07-16 DIAGNOSIS — Z86718 Personal history of other venous thrombosis and embolism: Secondary | ICD-10-CM | POA: Insufficient documentation

## 2017-07-16 DIAGNOSIS — Z801 Family history of malignant neoplasm of trachea, bronchus and lung: Secondary | ICD-10-CM | POA: Insufficient documentation

## 2017-07-16 DIAGNOSIS — J449 Chronic obstructive pulmonary disease, unspecified: Secondary | ICD-10-CM | POA: Insufficient documentation

## 2017-07-16 DIAGNOSIS — Z8673 Personal history of transient ischemic attack (TIA), and cerebral infarction without residual deficits: Secondary | ICD-10-CM | POA: Insufficient documentation

## 2017-07-16 DIAGNOSIS — Z923 Personal history of irradiation: Secondary | ICD-10-CM | POA: Diagnosis not present

## 2017-07-16 DIAGNOSIS — Z5112 Encounter for antineoplastic immunotherapy: Secondary | ICD-10-CM | POA: Insufficient documentation

## 2017-07-16 DIAGNOSIS — Z79899 Other long term (current) drug therapy: Secondary | ICD-10-CM | POA: Insufficient documentation

## 2017-07-16 DIAGNOSIS — C801 Malignant (primary) neoplasm, unspecified: Secondary | ICD-10-CM | POA: Diagnosis not present

## 2017-07-16 DIAGNOSIS — R918 Other nonspecific abnormal finding of lung field: Secondary | ICD-10-CM | POA: Insufficient documentation

## 2017-07-16 DIAGNOSIS — G8929 Other chronic pain: Secondary | ICD-10-CM | POA: Insufficient documentation

## 2017-07-16 DIAGNOSIS — Z8546 Personal history of malignant neoplasm of prostate: Secondary | ICD-10-CM | POA: Insufficient documentation

## 2017-07-16 DIAGNOSIS — Z87891 Personal history of nicotine dependence: Secondary | ICD-10-CM | POA: Insufficient documentation

## 2017-07-16 DIAGNOSIS — F418 Other specified anxiety disorders: Secondary | ICD-10-CM | POA: Insufficient documentation

## 2017-07-16 DIAGNOSIS — C799 Secondary malignant neoplasm of unspecified site: Secondary | ICD-10-CM | POA: Insufficient documentation

## 2017-07-16 DIAGNOSIS — M1990 Unspecified osteoarthritis, unspecified site: Secondary | ICD-10-CM | POA: Diagnosis not present

## 2017-07-16 MED ORDER — OXYCODONE HCL 5 MG PO TABS: 5.0000 mg | ORAL_TABLET | Freq: Four times a day (QID) | ORAL | 0 refills | Status: DC | PRN

## 2017-07-16 MED ORDER — MORPHINE SULFATE ER 30 MG PO TBCR: 30.0000 mg | EXTENDED_RELEASE_TABLET | Freq: Two times a day (BID) | ORAL | 0 refills | Status: DC

## 2017-07-16 NOTE — Assessment & Plan Note (Signed)
Tanner [name] is a 62 year old male who was noted to have a left upper lobe mass incidentally on CTA in 04/2017.  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  He has undergone palliative radiation to his pelvis and his spine.  He is here to discuss future treatment options.  1. Spindle cell neoplasm:  Dr. Julien Nordmann met with patient.  Pathology results were reviewed in detail.  Reviewed with patient that his cancer is stage IV and that means it is not curative.  We discussed systemic treatment option which is Doxil give intravenously every 28 days.  Risks and benefits were reviewed, in particular the cardiac risk and the need of an echocardiogram prior.  [name] verbalized understanding.  Also reviewed with patient, that he may consider a second opinion with Dr. Angelina Ok at [facility].  He agreed and the referral has been placed.  The tentative chemo start date is in 2 weeks.    2. Pain Control: We will increase his MS Contin to 72m BID #60.  I also refilled his Oxycodone 565m#120.  We reviewed the need for a bowel regimen and risks/benefits.  3. Goals of care/counseling: We reviewed with the patient in detail that due to his disease he has a limited life expectancy.  We reviewed that the goal of care with treatment is CONTROL.  I gave his ex wife information on advanced directives.  I also reviewed the patient with [facility] [employer] who will reach out to them via phone, or at his next appointment.

## 2017-07-16 NOTE — Progress Notes (Addendum)
[facility] Follow up:    Tanner Sell, NP [place] [id number]   DIAGNOSIS: Stage IV spindle cell neoplasm  SUMMARY OF ONCOLOGIC HISTORY:   Malignant tumor, spindle cell type ([place])   05/03/2017 Initial Diagnosis    Patient seen in the ED and underwent CTA showing a left upper lobe nodules about 2cm in size, and new lytic lesions.  MRI brain negative for metastases, MRI thoracic spine + lytic lesions      05/11/2017 PET scan    Hypermetabolic left upper lobe lesion, hypermetabolic left prevascular lymph node, diffuse osseous metastases.      06/01/2017 Initial Biopsy    Right ilium biopsy: poorly differentiated spindle cell neoplasm      06/03/2017 Miscellaneous    PSA tested and less than 0.01       - 06/22/2017 Radiation Therapy    Palliative pelvic radiation      06/27/2017 - 07/12/2017 Radiation Therapy    Palliative lumbar spine radiation      07/05/2017 Pathology Results    Right iliac biopsy: poorly differentiated malignancy, likely spindle cell with moderate to severe cytologic atypia       CURRENT THERAPY: Discussion of treatment options  INTERVAL HISTORY: Tanner [name] 62 y.o. male returns for follow up after his biopsy on 12/27 of his right iliac metastatic lesion.  He is here with his ex wife Tanner [name] and daughter Tanner [name] to review the pathology results.  They are his main caregivers in the home.  Since completing his lumbar spine radiation, his pain has continued.  He says that it makes it difficult to breathe and feels like a pinched nerve.  He notes a slight increase in leg weakness since undergoing radiation.  He denies bowel/bladder incontinence, or saddle anesthesia.  He needs increasing help in his activities of daily living.  He needs help with toileting, wiping, bathing, dressing, cooking.  His ex wife Tanner [name] and daughter Tanner [name] are in the home and are taking care of him.  They indicate that they are doing well  with this, and have no issues or concerns such as needing more assistive devices, etc.  [name] pain at its best is a 6-7/10 (at its most is a 10).  He is taking MS Contin BID and Oxycodone TID.  He is tolerating these meds well.  He is requesting an increase on his pain medications due to his pain being currently uncontrolled.     Patient Active Problem List   Diagnosis Date Noted  . Malignant tumor, spindle cell type ([place]) 07/16/2017  . Goals of care, counseling/discussion 07/16/2017  . Chronic low back pain 07/04/2017  . Bone metastasis ([place]) 06/23/2017  . COPD with acute exacerbation ([place]) 05/15/2017  . Multiple lung nodules on CT   . Near syncope 05/03/2017  . Lung mass 05/03/2017  . Hyperlipidemia 09/20/2016  . Protein-calorie malnutrition ([place]) 08/17/2016  . Thoracic compression fracture, closed, initial encounter ([place]) 08/17/2016  . Alcohol dependence ([place]) 08/17/2016  . Tobacco dependence 08/17/2016  . Osteoporosis 08/17/2016  . Hip fracture ([place]) 08/16/2016  . Headache 02/24/2016  . Hypertensive urgency 02/03/2016  . Shortness of breath 07/28/2015  . Generalized anxiety disorder 07/28/2015  . Medicare annual wellness visit, subsequent 05/24/2015  . Acute DVT of right tibial vein ([place]) 02/27/2015  . Tibia fracture 02/20/2015  . Syncope 02/18/2015  . Essential hypertension 02/18/2015  . History of stroke 02/18/2015  . Fracture of tibia, right, closed   .  C. difficile colitis 09/11/2013  . UTI (urinary tract infection) 09/11/2013  . Essential hypertension, benign 09/11/2013  . Ileus ([place]) 09/10/2013  . Spastic hemiplegia affecting dominant side ([place]) 09/09/2013  . Ileus, postoperative ([place]) 09/09/2013  . Hypokalemia 09/09/2013  . Nonspecific (abnormal) findings on radiological and other examination of gastrointestinal tract 09/09/2013  . CVA (cerebral infarction) 09/05/2013  . 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 Malignant neoplasm of prostate ([place]) 08/29/2013   . Hyponatremia 12/11/2012    Class: Chronic  . Ankle fracture 12/10/2012    is allergic to dilaudid [hydromorphone hcl]; procaine hcl; and sulfa antibiotics.  MEDICAL HISTORY: Past Medical History:  Diagnosis Date  . Alcohol abuse   . Alcoholic hepatitis   . Allergy   . Anxiety   . 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 C. difficile colitis   . Depression   . DVT (deep venous thrombosis) ([place]) 2015  . GERD (gastroesophageal reflux disease)   . Hypertension   . Ileus ([place]) 09/2013  . 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 Shortness of breath    new onset- occasionally-at rest and exertion  . Substance abuse ([place])   . TIA (transient ischemic attack) 08/2013    SURGICAL HISTORY: Past Surgical History:  Procedure Laterality Date  . HAND SURGERY Right   . HERNIA REPAIR    . INTRAMEDULLARY (IM) NAIL INTERTROCHANTERIC Right 08/17/2016   Procedure: INTRAMEDULLARY (IM) NAIL RIGHT HIP;  Surgeon: Tania Ade, MD;  Location: [facility];  Service: Orthopedics;  Laterality: Right;  . LYMPHADENECTOMY Bilateral 08/29/2013   Procedure: LYMPHADENECTOMY "BILATERAL PELVIC LYMPH NODE DISSECTION";  Surgeon: Molli Hazard, MD;  Location: [facility];  Service: Urology;  Laterality: Bilateral;  . ORIF ANKLE FRACTURE Right 12/10/2012   Procedure: OPEN REDUCTION INTERNAL FIXATION (ORIF) ANKLE FRACTURE;  Surgeon: Hessie Dibble, MD;  Location: [facility];  Service: Orthopedics;  Laterality: Right;  . ROBOT ASSISTED LAPAROSCOPIC RADICAL PROSTATECTOMY N/A 08/29/2013   Procedure: ROBOTIC ASSISTED LAPAROSCOPIC RADICAL PROSTATECTOMY LEVEL 2, Lysis of adhesions;  Surgeon: Molli Hazard, MD;  Location: [facility];  Service: Urology;  Laterality: N/A;    SOCIAL HISTORY: Social History   Socioeconomic History  . Marital status: Divorced    Spouse name: Not on file  . Number of children: 1  . Years of education: 63  . Highest education level: Not on file  Social Needs  . Financial resource  strain: Not on file  . Food insecurity - worry: Not on file  . Food insecurity - inability: Not on file  . Transportation needs - medical: Not on file  . Transportation needs - non-medical: Not on file  Occupational History  . Occupation: disabled    Comment: Fingers, scolisosis,   Tobacco Use  . Smoking status: Former Smoker    Packs/day: 1.50    Years: 30.00    Pack years: 45.00    Types: Cigarettes    Last attempt to quit: 03/09/2017    Years since quitting: 0.3  . Smokeless tobacco: Never Used  Substance and Sexual Activity  . Alcohol use: Yes    Alcohol/week: 3.0 - 6.0 oz    Types: 5 - 10 Cans of beer per week  . Drug use: Yes    Types: Marijuana  . Sexual activity: Not on file  Other Topics Concern  . Not on file  Social History Narrative   Denies    FAMILY HISTORY: Family History  Problem  Relation Age of Onset  . Lung cancer Father        smoker  . COPD Father   . Emphysema Father   . Arthritis Mother   . Pancreatic cancer Sister     Review of Systems  Constitutional: Positive for fatigue. Negative for appetite change, chills, fever and unexpected weight change.  HENT:   Negative for hearing loss and lump/mass.   Eyes: Negative for eye problems and icterus.  Respiratory: Negative for chest tightness, cough, hemoptysis and shortness of breath.   Cardiovascular: Negative for chest pain, leg swelling and palpitations.  Gastrointestinal: Negative for abdominal distention, abdominal pain, constipation (Takes miralax when needed ), diarrhea, nausea and vomiting.  Endocrine: Negative for hot flashes.  Musculoskeletal: Positive for back pain, gait problem and myalgias.  Skin: Negative for itching and rash.  Neurological: Positive for gait problem. Negative for dizziness, extremity weakness, headaches and numbness.  Hematological: Negative for adenopathy. Does not bruise/bleed easily.  Psychiatric/Behavioral: Negative for depression. The patient is not  nervous/anxious.       PHYSICAL EXAMINATION  ECOG PERFORMANCE STATUS: 3 - Symptomatic, >50% confined to bed  Vitals:   07/16/17 0930  BP: 115/73  Pulse: 80  Resp: 18  Temp: 98.2 F (36.8 C)  SpO2: 99%    Physical Exam  Constitutional: He is oriented to person, place, and time and well-developed, well-nourished, and in no distress.  HENT:  Head: Normocephalic and atraumatic.  Mouth/Throat: Oropharynx is clear and moist. No oropharyngeal exudate.  Eyes: Pupils are equal, round, and reactive to light. No scleral icterus.  Neck: Neck supple.  Cardiovascular: Normal rate, regular rhythm and normal heart sounds.  Pulmonary/Chest: Effort normal and breath sounds normal.  Abdominal: Soft. Bowel sounds are normal. He exhibits no distension and no mass. There is no tenderness. There is no rebound and no guarding.  Abdominal hernia noted   Musculoskeletal: He exhibits deformity (upper thoracic spine curvature noted on exam). He exhibits no edema.  Lymphadenopathy:    He has no cervical adenopathy.  Neurological: He is alert and oriented to person, place, and time.  Skin: Skin is warm and dry. No rash noted.  Psychiatric: Mood and affect normal.    LABORATORY DATA:  CBC    Component Value Date/Time   WBC 4.6 07/05/2017 1044   RBC 3.27 (L) 07/05/2017 1044   HGB 11.1 (L) 07/05/2017 1044   HGB 12.5 (L) 05/24/2017 1223   HCT 33.4 (L) 07/05/2017 1044   HCT 37.5 (L) 05/24/2017 1223   PLT 175 07/05/2017 1044   PLT 234 05/24/2017 1223   MCV 102.1 (H) 07/05/2017 1044   MCV 100.3 (H) 05/24/2017 1223   MCH 33.9 07/05/2017 1044   MCHC 33.2 07/05/2017 1044   RDW 13.6 07/05/2017 1044   RDW 13.7 05/24/2017 1223   LYMPHSABS 0.8 (L) 05/24/2017 1223   MONOABS 1.2 (H) 05/24/2017 1223   EOSABS 0.1 05/24/2017 1223   BASOSABS 0.0 05/24/2017 1223    CMP     Component Value Date/Time   NA 132 (L) 07/04/2017 1043   NA 134 (L) 05/24/2017 1223   K 4.4 07/04/2017 1043   K 4.2 05/24/2017  1223   CL 97 07/04/2017 1043   CO2 27 07/04/2017 1043   CO2 25 05/24/2017 1223   GLUCOSE 97 07/04/2017 1043   GLUCOSE 92 05/24/2017 1223   BUN 14 07/04/2017 1043   BUN 10.7 05/24/2017 1223   CREATININE 0.83 07/04/2017 1043   CREATININE 0.9 05/24/2017 1223  CALCIUM 9.1 07/04/2017 1043   CALCIUM 9.3 05/24/2017 1223   PROT 7.1 07/04/2017 1043   PROT 7.5 05/24/2017 1223   ALBUMIN 3.5 07/04/2017 1043   ALBUMIN 3.2 (L) 05/24/2017 1223   AST 16 07/04/2017 1043   AST 19 05/24/2017 1223   ALT 12 07/04/2017 1043   ALT 11 05/24/2017 1223   ALKPHOS 82 07/04/2017 1043   ALKPHOS 121 05/24/2017 1223   BILITOT 0.2 07/04/2017 1043   BILITOT 0.23 05/24/2017 1223   GFRNONAA >60 05/05/2017 0556   GFRAA >60 05/05/2017 0556      ASSESSMENT and PLAN:   Malignant tumor, spindle cell type ([place]) Tanner [name] is a 62 year old male who was noted to have a left upper lobe mass incidentally on CTA in 04/2017.  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  He has undergone palliative radiation to his pelvis and his spine.  He is here to discuss future treatment options.  1. Spindle cell neoplasm:  Dr. Julien Nordmann met with patient.  Pathology results were reviewed in detail.  Reviewed with patient that his cancer is stage IV and that means it is not curative.  We discussed systemic treatment option which is Doxil give intravenously every 28 days.  Risks and benefits were reviewed, in particular the cardiac risk and the need of an echocardiogram prior.  [name] verbalized understanding.  Also reviewed with patient, that he may consider a second opinion with Dr. Angelina Ok at [facility].  He agreed and the referral has been placed.  The tentative chemo start date is in 2 weeks.    2. Pain Control: We will increase his MS Contin to 31m BID #60.  I also refilled his Oxycodone 566m#120.  We reviewed the need for a bowel regimen and risks/benefits.  3. Goals of care/counseling: We reviewed with the  patient in detail that due to his disease he has a limited life expectancy.  We reviewed that the goal of care with treatment is CONTROL.  I gave his ex wife information on advanced directives.  I also reviewed the patient with [facility] Social [facility] who will reach out to them via phone, or at his next appointment.     Orders Placed This Encounter  Procedures  . Ambulatory referral to Oncology    Referral Priority:   Urgent    Referral Type:   Consultation    Referral Reason:   Second Opinion    Referred to Provider:   RiAcey LavMD    Number of Visits Requested:   1  . ECHOCARDIOGRAM COMPLETE    Standing Status:   Future    Standing Expiration Date:   10/15/2018    Order Specific Question:   Where should this test be performed    Answer:   [facility]  Order Specific Question:   Perflutren DEFINITY (image enhancing agent) should be administered unless hypersensitivity or allergy exist    Answer:   Administer Perflutren    Order Specific Question:   Expected Date:    Answer:   1 week    All questions were answered. The patient knows to call the clinic with any problems, questions or concerns. We can certainly see the patient much sooner if necessary.  This note was electronically signed. LiScot DockNP 07/16/2017   ADDENDUM: Hematology/Oncology Attending: I had a face-to-face encounter with the patient today.  I recommended his care plan.  This is a very pleasant  62 years old white male with metastatic spindle cell neoplasm with significant metastatic disease to the bone and biopsy twice from the right iliac bone from different locations that was consistent with the above diagnosis.  The patient is status post palliative radiotherapy to the painful metastatic bone disease.  He is also on multiple pain medications including MS Contin as well as oxycodone for breakthrough pain.  His pain is not well controlled with the current regimen.  I recommended for him to increase  his MS Contin to 30 mg p.o. every 12 hours and to continue with oxycodone for breakthrough pain. I also had a lengthy discussion with the patient and his family today about his current disease stage, prognosis and treatment of the patient understand that he has an incurable condition and all the treatment will be of palliative nature.  I gave him the option of palliative care and hospice referral versus consideration of systemic chemotherapy with Doxil every 4 weeks. We also discussed the option of referral to Dr. Angelina Ok at [facility] for second opinion regarding his options. The patient is not interested in palliative care at this point.  He agreed to the second opinion with Dr. Angelina Ok but also would consider proceeding with treatment with Doxil.  We will arrange for the first dose of this treatment to be done within 2 weeks.  We will arrange for the patient to have 2D echo for evaluation of his ejection fraction before starting the first dose of this treatment.  He will have a chemotherapy education class before the first dose of his treatment. We will arrange for the patient to come back for follow-up visit in 2 weeks for reevaluation with the start of the first dose of his treatment. The patient was advised to call immediately if he has any concerning symptoms in the interval. Disclaimer: This note was dictated with voice recognition software. Similar sounding words can inadvertently be transcribed and may be missed upon review. Eilleen Kempf, MD 07/16/17

## 2017-07-16 NOTE — Telephone Encounter (Signed)
Gave patient AVs and calendar of upcoming February through April appointments.  °

## 2017-07-17 ENCOUNTER — Ambulatory Visit (HOSPITAL_COMMUNITY)
Admission: RE | Admit: 2017-07-17 | Discharge: 2017-07-17 | Disposition: A | Discharge status: Home / Self Care | Payer: Medicare HMO | Source: Ambulatory Visit | Attending: Adult Health | Admitting: Adult Health

## 2017-07-17 ENCOUNTER — Ambulatory Visit: Payer: Medicare HMO

## 2017-07-17 DIAGNOSIS — C801 Malignant (primary) neoplasm, unspecified: Secondary | ICD-10-CM | POA: Diagnosis not present

## 2017-07-17 DIAGNOSIS — Z8673 Personal history of transient ischemic attack (TIA), and cerebral infarction without residual deficits: Secondary | ICD-10-CM | POA: Insufficient documentation

## 2017-07-17 DIAGNOSIS — J449 Chronic obstructive pulmonary disease, unspecified: Secondary | ICD-10-CM | POA: Diagnosis not present

## 2017-07-17 DIAGNOSIS — Z87891 Personal history of nicotine dependence: Secondary | ICD-10-CM | POA: Diagnosis not present

## 2017-07-17 DIAGNOSIS — I5189 Other ill-defined heart diseases: Secondary | ICD-10-CM | POA: Diagnosis not present

## 2017-07-17 DIAGNOSIS — I517 Cardiomegaly: Secondary | ICD-10-CM | POA: Diagnosis not present

## 2017-07-17 NOTE — Progress Notes (Signed)
  Echocardiogram 2D Echocardiogram has been performed.  Darlina Sicilian M 07/17/2017, 11:39 AM

## 2017-07-18 ENCOUNTER — Encounter: Payer: Self-pay | Admitting: *Deleted

## 2017-07-18 ENCOUNTER — Ambulatory Visit: Payer: Medicare HMO

## 2017-07-18 NOTE — Progress Notes (Signed)
[facility] Work  Holiday representative received referral from APP for resources and emotional support.  CSW met with patients caregiver in [place] office at [facility] to offer support and assess for needs.  Caregiver requested information on caregiver support.  CSW provided "caring for the caregiver" booklet and resources for emotional support through diagnosis and treatment.  CSW provided emotional support and space for caregiver to process fears and concerns.  CSW provided contact information and plans to meet with patient and family at next appointment.  CSW encouraged patient and family to call with needs or concerns.    Johnnye Lana, MSW, LCSW, OSW-C Clinical Social Worker [facility] [phone]

## 2017-07-19 ENCOUNTER — Ambulatory Visit: Payer: Medicare HMO

## 2017-07-19 ENCOUNTER — Telehealth: Payer: Self-pay

## 2017-07-19 NOTE — Telephone Encounter (Signed)
Clara [phone]) from Dr. Vertell Limber office at [facility] called with appt date for patient, 07/26/17 at 11 am. She said they have spoke with patient.

## 2017-07-20 ENCOUNTER — Telehealth: Payer: Self-pay | Admitting: Internal Medicine

## 2017-07-20 NOTE — Telephone Encounter (Signed)
Spoke to patients wife regarding upcoming January appointments.

## 2017-07-23 DIAGNOSIS — C801 Malignant (primary) neoplasm, unspecified: Secondary | ICD-10-CM | POA: Diagnosis not present

## 2017-07-23 DIAGNOSIS — C3490 Malignant neoplasm of unspecified part of unspecified bronchus or lung: Secondary | ICD-10-CM | POA: Diagnosis not present

## 2017-07-23 DIAGNOSIS — C7951 Secondary malignant neoplasm of bone: Secondary | ICD-10-CM | POA: Diagnosis not present

## 2017-07-26 DIAGNOSIS — C3492 Malignant neoplasm of unspecified part of left bronchus or lung: Secondary | ICD-10-CM | POA: Diagnosis not present

## 2017-07-26 DIAGNOSIS — C3490 Malignant neoplasm of unspecified part of unspecified bronchus or lung: Secondary | ICD-10-CM | POA: Diagnosis not present

## 2017-07-26 DIAGNOSIS — C801 Malignant (primary) neoplasm, unspecified: Secondary | ICD-10-CM | POA: Diagnosis not present

## 2017-07-26 DIAGNOSIS — C7951 Secondary malignant neoplasm of bone: Secondary | ICD-10-CM | POA: Diagnosis not present

## 2017-07-31 ENCOUNTER — Inpatient Hospital Stay: Payer: Medicare HMO

## 2017-07-31 ENCOUNTER — Encounter: Payer: Self-pay | Admitting: Internal Medicine

## 2017-07-31 ENCOUNTER — Telehealth: Payer: Self-pay | Admitting: Internal Medicine

## 2017-07-31 ENCOUNTER — Inpatient Hospital Stay (HOSPITAL_BASED_OUTPATIENT_CLINIC_OR_DEPARTMENT_OTHER): Payer: Medicare HMO | Admitting: Internal Medicine

## 2017-07-31 ENCOUNTER — Other Ambulatory Visit (HOSPITAL_COMMUNITY)
Admission: RE | Admit: 2017-07-31 | Discharge: 2017-07-31 | Disposition: A | Discharge status: Home / Self Care | Payer: Medicare HMO | Source: Ambulatory Visit | Attending: Internal Medicine | Admitting: Internal Medicine

## 2017-07-31 ENCOUNTER — Encounter: Payer: Self-pay | Admitting: Radiation Oncology

## 2017-07-31 ENCOUNTER — Other Ambulatory Visit: Payer: Self-pay | Admitting: Nurse Practitioner

## 2017-07-31 VITALS — BP 106/70 | HR 71 | Temp 98.0°F | Resp 18 | Ht 70.0 in | Wt 153.5 lb

## 2017-07-31 DIAGNOSIS — J449 Chronic obstructive pulmonary disease, unspecified: Secondary | ICD-10-CM | POA: Diagnosis not present

## 2017-07-31 DIAGNOSIS — G893 Neoplasm related pain (acute) (chronic): Secondary | ICD-10-CM

## 2017-07-31 DIAGNOSIS — M1990 Unspecified osteoarthritis, unspecified site: Secondary | ICD-10-CM

## 2017-07-31 DIAGNOSIS — C7951 Secondary malignant neoplasm of bone: Secondary | ICD-10-CM

## 2017-07-31 DIAGNOSIS — Z79899 Other long term (current) drug therapy: Secondary | ICD-10-CM

## 2017-07-31 DIAGNOSIS — M545 Low back pain, unspecified: Secondary | ICD-10-CM

## 2017-07-31 DIAGNOSIS — F418 Other specified anxiety disorders: Secondary | ICD-10-CM

## 2017-07-31 DIAGNOSIS — C801 Malignant (primary) neoplasm, unspecified: Secondary | ICD-10-CM | POA: Diagnosis not present

## 2017-07-31 DIAGNOSIS — Z87891 Personal history of nicotine dependence: Secondary | ICD-10-CM

## 2017-07-31 DIAGNOSIS — G8929 Other chronic pain: Secondary | ICD-10-CM

## 2017-07-31 DIAGNOSIS — M439 Deforming dorsopathy, unspecified: Secondary | ICD-10-CM

## 2017-07-31 DIAGNOSIS — R0609 Other forms of dyspnea: Secondary | ICD-10-CM | POA: Diagnosis not present

## 2017-07-31 DIAGNOSIS — Z86718 Personal history of other venous thrombosis and embolism: Secondary | ICD-10-CM

## 2017-07-31 DIAGNOSIS — Z8546 Personal history of malignant neoplasm of prostate: Secondary | ICD-10-CM

## 2017-07-31 DIAGNOSIS — M25559 Pain in unspecified hip: Secondary | ICD-10-CM | POA: Diagnosis not present

## 2017-07-31 DIAGNOSIS — R5383 Other fatigue: Secondary | ICD-10-CM

## 2017-07-31 DIAGNOSIS — Z5112 Encounter for antineoplastic immunotherapy: Secondary | ICD-10-CM | POA: Diagnosis not present

## 2017-07-31 DIAGNOSIS — Z801 Family history of malignant neoplasm of trachea, bronchus and lung: Secondary | ICD-10-CM

## 2017-07-31 DIAGNOSIS — K469 Unspecified abdominal hernia without obstruction or gangrene: Secondary | ICD-10-CM

## 2017-07-31 DIAGNOSIS — C771 Secondary and unspecified malignant neoplasm of intrathoracic lymph nodes: Secondary | ICD-10-CM | POA: Diagnosis not present

## 2017-07-31 DIAGNOSIS — Z800 Family history of malignant neoplasm of digestive organs: Secondary | ICD-10-CM

## 2017-07-31 DIAGNOSIS — E785 Hyperlipidemia, unspecified: Secondary | ICD-10-CM

## 2017-07-31 DIAGNOSIS — C61 Malignant neoplasm of prostate: Secondary | ICD-10-CM

## 2017-07-31 DIAGNOSIS — Z923 Personal history of irradiation: Secondary | ICD-10-CM | POA: Diagnosis not present

## 2017-07-31 DIAGNOSIS — K219 Gastro-esophageal reflux disease without esophagitis: Secondary | ICD-10-CM

## 2017-07-31 DIAGNOSIS — R918 Other nonspecific abnormal finding of lung field: Secondary | ICD-10-CM

## 2017-07-31 DIAGNOSIS — I10 Essential (primary) hypertension: Secondary | ICD-10-CM

## 2017-07-31 DIAGNOSIS — Z8673 Personal history of transient ischemic attack (TIA), and cerebral infarction without residual deficits: Secondary | ICD-10-CM

## 2017-07-31 LAB — COMPREHENSIVE METABOLIC PANEL
ALK PHOS: 107 U/L (ref 40–150)
ALT: 7 U/L (ref 0–55)
AST: 14 U/L (ref 5–34)
Albumin: 2.7 g/dL — ABNORMAL LOW (ref 3.5–5.0)
Anion gap: 13 — ABNORMAL HIGH (ref 3–11)
BILIRUBIN TOTAL: 0.2 mg/dL (ref 0.2–1.2)
BUN: 11 mg/dL (ref 7–26)
CALCIUM: 10.2 mg/dL (ref 8.4–10.4)
CO2: 23 mmol/L (ref 22–29)
CREATININE: 0.72 mg/dL (ref 0.70–1.30)
Chloride: 97 mmol/L — ABNORMAL LOW (ref 98–109)
Glucose, Bld: 103 mg/dL (ref 70–140)
Potassium: 3.8 mmol/L (ref 3.5–5.1)
Sodium: 133 mmol/L — ABNORMAL LOW (ref 136–145)
TOTAL PROTEIN: 7 g/dL (ref 6.4–8.3)

## 2017-07-31 LAB — CBC WITH DIFFERENTIAL/PLATELET
BASOS ABS: 0 10*3/uL (ref 0.0–0.1)
BASOS PCT: 1 %
EOS ABS: 0.4 10*3/uL (ref 0.0–0.5)
Eosinophils Relative: 6 %
HEMATOCRIT: 34.6 % — AB (ref 38.4–49.9)
Hemoglobin: 11.8 g/dL — ABNORMAL LOW (ref 13.0–17.1)
Lymphocytes Relative: 2 %
Lymphs Abs: 0.2 10*3/uL — ABNORMAL LOW (ref 0.9–3.3)
MCH: 33.7 pg — ABNORMAL HIGH (ref 27.2–33.4)
MCHC: 34 g/dL (ref 32.0–36.0)
MCV: 98.9 fL — ABNORMAL HIGH (ref 79.3–98.0)
MONOS PCT: 9 %
Monocytes Absolute: 0.6 10*3/uL (ref 0.1–0.9)
NEUTROS ABS: 6.1 10*3/uL (ref 1.5–6.5)
NEUTROS PCT: 82 %
Platelets: 172 10*3/uL (ref 140–400)
RBC: 3.5 MIL/uL — ABNORMAL LOW (ref 4.20–5.82)
RDW: 14 % (ref 11.0–15.6)
WBC: 7.3 10*3/uL (ref 4.0–10.3)

## 2017-07-31 MED ORDER — SODIUM CHLORIDE 0.9% FLUSH
10.0000 mL | INTRAVENOUS | Status: DC | PRN
  Filled 2017-07-31: qty 10

## 2017-07-31 MED ORDER — DEXTROSE 5 % IV SOLN
Freq: Once | INTRAVENOUS | Status: AC
  Administered 2017-07-31: 11:00:00 via INTRAVENOUS

## 2017-07-31 MED ORDER — DEXAMETHASONE SODIUM PHOSPHATE 10 MG/ML IJ SOLN
10.0000 mg | Freq: Once | INTRAMUSCULAR | Status: AC
  Administered 2017-07-31: 10 mg via INTRAVENOUS

## 2017-07-31 MED ORDER — HEPARIN SOD (PORK) LOCK FLUSH 100 UNIT/ML IV SOLN
500.0000 [IU] | Freq: Once | INTRAVENOUS | Status: DC | PRN
  Filled 2017-07-31: qty 5

## 2017-07-31 MED ORDER — DOXORUBICIN HCL LIPOSOMAL CHEMO INJECTION 2 MG/ML
53.0000 mg/m2 | Freq: Once | INTRAVENOUS | Status: AC
  Administered 2017-07-31: 100 mg via INTRAVENOUS
  Filled 2017-07-31: qty 50

## 2017-07-31 NOTE — Progress Notes (Signed)
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  He presented with left upper lobe pulmonary nodule in addition to left prevascular lymph node and multiple metastatic bone disease.  PRIOR THERAPY: Palliative radiotherapy to the metastatic bone disease in the right pelvic area under the care of Dr. Sondra Come..  CURRENT THERAPY: Systemic chemotherapy with Doxil 50 mg/M2 every 4 weeks with Neulasta support.  First dose July 31, 2017.  INTERVAL HISTORY: Tanner [name] 62 y.o. male returns to the clinic today for follow-up visit accompanied by his ex-wife and daughter.  The patient continues to complain of increasing fatigue and weakness as well as the persistent pain in the hip and back.  He underwent palliative radiotherapy to the pelvic area before.  He is currently on pain medication with MS Contin 30 mg p.o. every 12 hours in addition to oxycodone 5 mg p.o. every 6 hours as needed.  The patient was seen recently at [facility] by Dr. Durenda Hurt for a second opinion.  Review of the pathology report indicated that the patient may have non-small cell carcinoma questionable for large cell.  The recommendation was to either continue with Doxil or consider the patient for treatment with carboplatin, paclitaxel and Keytruda or single agent Keytruda depending on PDL 1 expression.  The patient denied having any current chest pain but has shortness of breath with exertion with no cough or hemoptysis.  He denied having any fever or chills.  He has no nausea, vomiting, diarrhea or constipation.   MEDICAL HISTORY: Past Medical History:  Diagnosis Date  . Alcohol abuse   . Alcoholic hepatitis   . Allergy   . Anxiety   . 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 C. difficile colitis   . Depression   . DVT (deep venous thrombosis) ([place]) 2015  . GERD (gastroesophageal reflux disease)   . Hypertension   . Ileus ([place]) 09/2013  . Prostate cancer ([place]) 05/22/13   Gleason 4+3=7  . Shortness of breath    new onset- occasionally-at rest and exertion  . Substance abuse ([place])   . TIA (transient ischemic attack) 08/2013    ALLERGIES:  is allergic to dilaudid [hydromorphone hcl]; procaine hcl; and sulfa antibiotics.  MEDICATIONS:  Current Outpatient Medications  Medication Sig Dispense Refill  . amLODipine (NORVASC) 10 MG tablet Take 1 tablet (10 mg total) daily by mouth. 90 tablet 1  . bifidobacterium infantis (ALIGN) capsule Take 1 capsule daily by mouth.    . diazepam (VALIUM) 5 MG tablet Take 1 tablet (5 mg total) by mouth 2 (two) times daily as needed for anxiety or muscle spasms. 60 tablet 0  . emollient (BIAFINE) cream Apply 1 application topically 2 (two) times daily. Apply to skin areas after rad tx and bedtime daily,nothing 4 hours prior to rad txs    . FLUoxetine (PROZAC) 40 MG capsule TAKE 1 CAPSULE BY MOUTH EVERY DAY 90 capsule 1  . lisinopril (PRINIVIL,ZESTRIL) 40 MG tablet TAKE ONE (1) TABLET BY MOUTH EVERY DAY 90 tablet 1  . loperamide (IMODIUM) 2 MG capsule Take 2 capsules (4 mg total) by mouth every 8 (eight) hours as needed for diarrhea or loose stools. Over the counter 30 capsule 0  . metoprolol  tartrate (LOPRESSOR) 100 MG tablet Take 1 tablet (100 mg total) by mouth 2 (two) times daily. 180 tablet 1  . morphine (MS CONTIN) 30 MG 12 hr tablet Take 1 tablet (30 mg total) by mouth every 12 (twelve) hours. 60 tablet 0  . omeprazole (PRILOSEC) 40 MG capsule Take 1 capsule (40 mg total) by mouth 2 (two) times daily. 120 capsule 0  . ondansetron (ZOFRAN) 8 MG tablet Take 1 tablet (8 mg total) by mouth every 8 (eight) hours as needed for nausea or vomiting. 30 tablet 3  . oxyCODONE (OXY IR/ROXICODONE) 5 MG immediate release  tablet Take 1 tablet (5 mg total) by mouth every 6 (six) hours as needed for severe pain. 120 tablet 0  . simvastatin (ZOCOR) 20 MG tablet TAKE ONE (1) TABLET BY MOUTH EVERY DAY 90 tablet 1  . thiamine 100 MG tablet Take 1 tablet (100 mg total) by mouth daily. 30 tablet 3  . traZODone (DESYREL) 50 MG tablet Take 1 tablet (50 mg total) by mouth at bedtime as needed. for sleep 30 tablet 0   No current facility-administered medications for this visit.     SURGICAL HISTORY:  Past Surgical History:  Procedure Laterality Date  . HAND SURGERY Right   . HERNIA REPAIR    . INTRAMEDULLARY (IM) NAIL INTERTROCHANTERIC Right 08/17/2016   Procedure: INTRAMEDULLARY (IM) NAIL RIGHT HIP;  Surgeon: Tania Ade, MD;  Location: [place];  Service: Orthopedics;  Laterality: Right;  . LYMPHADENECTOMY Bilateral 08/29/2013   Procedure: LYMPHADENECTOMY "BILATERAL PELVIC LYMPH NODE DISSECTION";  Surgeon: Molli Hazard, MD;  Location: [facility];  Service: Urology;  Laterality: Bilateral;  . ORIF ANKLE FRACTURE Right 12/10/2012   Procedure: OPEN REDUCTION INTERNAL FIXATION (ORIF) ANKLE FRACTURE;  Surgeon: Hessie Dibble, MD;  Location: [facility];  Service: Orthopedics;  Laterality: Right;  . ROBOT ASSISTED LAPAROSCOPIC RADICAL PROSTATECTOMY N/A 08/29/2013   Procedure: ROBOTIC ASSISTED LAPAROSCOPIC RADICAL PROSTATECTOMY LEVEL 2, Lysis of adhesions;  Surgeon: Molli Hazard, MD;  Location: [facility];  Service: Urology;  Laterality: N/A;    REVIEW OF SYSTEMS:  Constitutional: positive for fatigue Eyes: negative Ears, nose, mouth, throat, and face: negative Respiratory: positive for dyspnea on exertion Cardiovascular: negative Gastrointestinal: negative Genitourinary:negative Integument/breast: negative Hematologic/lymphatic: negative Musculoskeletal:positive for back pain and bone pain Neurological: negative Behavioral/Psych: negative Endocrine: negative Allergic/Immunologic: negative   PHYSICAL  EXAMINATION: General appearance: alert, cooperative, fatigued and no distress Head: Normocephalic, without obvious abnormality, atraumatic Neck: no adenopathy, no JVD, supple, symmetrical, trachea midline and thyroid not enlarged, symmetric, no tenderness/mass/nodules Lymph nodes: Cervical, supraclavicular, and axillary nodes normal. Resp: clear to auscultation bilaterally Back: symmetric, no curvature. ROM normal. No CVA tenderness. Cardio: regular rate and rhythm, S1, S2 normal, no murmur, click, rub or gallop GI: soft, non-tender; bowel sounds normal; no masses,  no organomegaly Extremities: extremities normal, atraumatic, no cyanosis or edema Neurologic: Alert and oriented X 3, normal strength and tone. Normal symmetric reflexes. Normal coordination and gait  ECOG PERFORMANCE STATUS: 1 - Symptomatic but completely ambulatory  Blood pressure 106/70, pulse 71, temperature 98 F (36.7 C), temperature source Oral, resp. rate 18, height 5\' 10"  (1.778 m), weight 153 lb 8 oz (69.6 kg), SpO2 96 %.  LABORATORY DATA: Lab Results  Component Value Date   WBC 4.6 07/05/2017   HGB 11.1 (L) 07/05/2017   HCT 33.4 (L) 07/05/2017   MCV 102.1 (H) 07/05/2017   PLT 175 07/05/2017      Chemistry  Component Value Date/Time   NA 132 (L) 07/04/2017 1043   NA 134 (L) 05/24/2017 1223   K 4.4 07/04/2017 1043   K 4.2 05/24/2017 1223   CL 97 07/04/2017 1043   CO2 27 07/04/2017 1043   CO2 25 05/24/2017 1223   BUN 14 07/04/2017 1043   BUN 10.7 05/24/2017 1223   CREATININE 0.83 07/04/2017 1043   CREATININE 0.9 05/24/2017 1223      Component Value Date/Time   CALCIUM 9.1 07/04/2017 1043   CALCIUM 9.3 05/24/2017 1223   ALKPHOS 82 07/04/2017 1043   ALKPHOS 121 05/24/2017 1223   AST 16 07/04/2017 1043   AST 19 05/24/2017 1223   ALT 12 07/04/2017 1043   ALT 11 05/24/2017 1223   BILITOT 0.2 07/04/2017 1043   BILITOT 0.23 05/24/2017 1223       RADIOGRAPHIC STUDIES: Ct Biopsy  Result Date:  07/05/2017 INDICATION: Osseous metastatic disease, suspect metastatic lung cancer EXAM: CT-GUIDED BIOPSY RIGHT ILIAC DESTRUCTIVE LYTIC BONE MASS MEDICATIONS: 1% LIDOCAINE LOCAL ANESTHESIA/SEDATION: 2.0 mg IV Versed; 50 mcg IV Fentanyl Moderate Sedation Time:  10 MINUTES The patient was continuously monitored during the procedure by the interventional radiology nurse under my direct supervision. PROCEDURE: The procedure, risks, benefits, and alternatives were explained to the patient. Questions regarding the procedure were encouraged and answered. The patient understands and consents to the procedure. Previous imaging reviewed. Patient positioned prone. Noncontrast localization CT performed. The posterior right iliac destructive bone lesion was localized. Overlying skin marked. Under sterile conditions and local anesthesia, an 11 gauge bone biopsy needle was advanced from a posterior approach into the right iliac crest bone lesion. Needle position confirmed with CT. 11 gauge core biopsy obtained. Sample placed in formalin. Needle removed. Postprocedure imaging demonstrates no hemorrhage or hematoma. Patient tolerated the procedure well without complication. Vital sign monitoring by nursing staff during the procedure will continue as patient is in the special procedures unit for post procedure observation. FINDINGS: The images document guide needle placement within the right iliac posterior bone lesion. Post biopsy images demonstrate no hemorrhage or hematoma. COMPLICATIONS: None immediate. IMPRESSION: Successful CT-guided core biopsy of the posterior right iliac crest bone lesion. Electronically Signed   By: Jerilynn Mages.  Shick M.D.   On: 07/05/2017 13:17    ASSESSMENT AND PLAN: This is a very pleasant 62 years old white male with metastatic neoplasm of unknown type and primary at this point, suspicious for spindle cell neoplasm.  Review of the pathology report at [facility] also indicated that this could be non-small  cell carcinoma, large cell. The patient underwent a course of palliative radiotherapy to the right pelvic area. I had a lengthy discussion with the patient and his family about his current condition and treatment options.  He is scheduled for treatment with Doxil 50 mg/M2 every 4 weeks first dose today.  Also discussed with the patient delaying his treatment until we tested for PDL 1 expression and may consider him for treatment in the future with either single agent Keytruda or a combination of carboplatin, paclitaxel and Keytruda. The patient and his family would like to proceed with the treatment today as planned.  They do not want any more delay of his systemic treatment. I will also make a referral for the patient to see a dentist at the [facility] for clearance of Xgeva treatment. For pain management, the patient will continue his current treatment with MS Contin and OxyIR. I will see him back for follow-up visit in 2  weeks for evaluation and repeat blood work. He was advised to call immediately if he has any concerning symptoms in the interval. The patient voices understanding of current disease status and treatment options and is in agreement with the current care plan.  All questions were answered. The patient knows to call the clinic with any problems, questions or concerns. We can certainly see the patient much sooner if necessary.  I spent 15 minutes counseling the patient face to face. The total time spent in the appointment was 25 minutes.  Disclaimer: This note was dictated with voice recognition software. Similar sounding words can inadvertently be transcribed and may not be corrected upon review.

## 2017-07-31 NOTE — Patient Instructions (Addendum)
[facility] Discharge Instructions for Patients Receiving Chemotherapy  Today you received the following chemotherapy agents Doxil  To help prevent nausea and vomiting after your treatment, we encourage you to take your nausea medication as directed   If you develop nausea and vomiting that is not controlled by your nausea medication, call the clinic.   BELOW ARE SYMPTOMS THAT SHOULD BE REPORTED IMMEDIATELY:  *FEVER GREATER THAN 100.5 F  *CHILLS WITH OR WITHOUT FEVER  NAUSEA AND VOMITING THAT IS NOT CONTROLLED WITH YOUR NAUSEA MEDICATION  *UNUSUAL SHORTNESS OF BREATH  *UNUSUAL BRUISING OR BLEEDING  TENDERNESS IN MOUTH AND THROAT WITH OR WITHOUT PRESENCE OF ULCERS  *URINARY PROBLEMS  *BOWEL PROBLEMS  UNUSUAL RASH Items with * indicate a potential emergency and should be followed up as soon as possible.  Feel free to call the clinic should you have any questions or concerns. The clinic phone number is [phone].  Please show the CHEMO ALERT CARD at check-in to the Emergency Department and triage nurse.  Doxorubicin Liposomal injection What is this medicine? LIPOSOMAL DOXORUBICIN (LIP oh som al dox oh ROO bi sin) is a chemotherapy drug. This medicine is used to treat many kinds of cancer like Kaposi's sarcoma, multiple myeloma, and ovarian cancer. This medicine may be used for other purposes; ask your health care provider or pharmacist if you have questions. COMMON BRAND NAME(S): Doxil, Lipodox What should I tell my health care provider before I take this medicine? They need to know if you have any of these conditions: -blood disorders -heart disease -infection (especially a virus infection such as chickenpox, cold sores, or herpes) -liver disease -recent or ongoing radiation therapy -an unusual or allergic reaction to doxorubicin, other chemotherapy agents, soybeans, other medicines, foods, dyes, or preservatives -pregnant or trying to get  pregnant -breast-feeding How should I use this medicine? This drug is given as an infusion into a vein. It is administered in a hospital or clinic by a specially trained health care professional. If you have pain, swelling, burning or any unusual feeling around the site of your injection, tell your health care professional right away. Talk to your pediatrician regarding the use of this medicine in children. Special care may be needed. Overdosage: If you think you have taken too much of this medicine contact a poison control center or emergency room at once. NOTE: This medicine is only for you. Do not share this medicine with others. What if I miss a dose? It is important not to miss your dose. Call your doctor or health care professional if you are unable to keep an appointment. What may interact with this medicine? Do not take this medicine with any of the following medications: -zidovudine This medicine may also interact with the following medications: -medicines to increase blood counts like filgrastim, pegfilgrastim, sargramostim -vaccines Talk to your doctor or health care professional before taking any of these medicines: -acetaminophen -aspirin -ibuprofen -ketoprofen -naproxen This list may not describe all possible interactions. Give your health care provider a list of all the medicines, herbs, non-prescription drugs, or dietary supplements you use. Also tell them if you smoke, drink alcohol, or use illegal drugs. Some items may interact with your medicine. What should I watch for while using this medicine? Your condition will be monitored carefully while you are receiving this medicine. You will need important blood work done while you are taking this medicine. This drug may make you feel generally unwell. This is not uncommon, as chemotherapy can   affect healthy cells as well as cancer cells. Report any side effects. Continue your course of treatment even though you feel ill unless  your doctor tells you to stop. Your urine may turn orange-red for a few days after your dose. This is not blood. If your urine is dark or brown, call your doctor. In some cases, you may be given additional medicines to help with side effects. Follow all directions for their use. Call your doctor or health care professional for advice if you get a fever (100.5 degrees F or higher), chills or sore throat, or other symptoms of a cold or flu. Do not treat yourself. This drug decreases your body's ability to fight infections. Try to avoid being around people who are sick. This medicine may increase your risk to bruise or bleed. Call your doctor or health care professional if you notice any unusual bleeding. Be careful brushing and flossing your teeth or using a toothpick because you may get an infection or bleed more easily. If you have any dental work done, tell your dentist you are receiving this medicine. Avoid taking products that contain aspirin, acetaminophen, ibuprofen, naproxen, or ketoprofen unless instructed by your doctor. These medicines may hide a fever. Men and women of childbearing age should use effective birth control methods while using taking this medicine. Do not become pregnant while taking this medicine. There is a potential for serious side effects to an unborn child. Talk to your health care professional or pharmacist for more information. Do not breast-feed an infant while taking this medicine. Talk to your doctor about your risk of cancer. You may be more at risk for certain types of cancers if you take this medicine. What side effects may I notice from receiving this medicine? Side effects that you should report to your doctor or health care professional as soon as possible: -allergic reactions like skin rash, itching or hives, swelling of the face, lips, or tongue -low blood counts - this medicine may decrease the number of white blood cells, red blood cells and platelets. You may  be at increased risk for infections and bleeding. -signs of hand-foot syndrome - tingling or burning, redness, flaking, swelling, small blisters, or small sores on the palms of your hands or the soles of your feet -signs of infection - fever or chills, cough, sore throat, pain or difficulty passing urine -signs of decreased platelets or bleeding - bruising, pinpoint red spots on the skin, black, tarry stools, blood in the urine -signs of decreased red blood cells - unusually weak or tired, fainting spells, lightheadedness -back pain, chills, facial flushing, fever, headache, tightness in the chest or throat during the infusion -breathing problems -chest pain -fast, irregular heartbeat -mouth pain, redness, sores -pain, swelling, redness at site where injected -pain, tingling, numbness in the hands or feet -swelling of ankles, feet, or hands -vomiting Side effects that usually do not require medical attention (report to your doctor or health care professional if they continue or are bothersome): -diarrhea -hair loss -loss of appetite -nail discoloration or damage -nausea -red or watery eyes -red colored urine -stomach upset This list may not describe all possible side effects. Call your doctor for medical advice about side effects. You may report side effects to FDA at 1-800-FDA-1088. Where should I keep my medicine? This drug is given in a hospital or clinic and will not be stored at home. NOTE: This sheet is a summary. It may not cover all possible information. If you have questions  about this medicine, talk to your doctor, pharmacist, or health care provider.  2018 Elsevier/Gold Standard (2012-03-15 10:12:56)

## 2017-07-31 NOTE — Progress Notes (Signed)
Discharge instructions printed and verbally reviewed. Chemo cars provided. Patient and daughter verbalized understanding. Denies concerns on exit.

## 2017-07-31 NOTE — Progress Notes (Signed)
°  Radiation Oncology         [phone] ________________________________  Name: Tanner [name] MRN: [id number]  Date: 07/31/2017  DOB: 1955-10-20  End of Treatment Note  Diagnosis:  Secondary malignant neoplasm of bone    Indication for treatment:  Palliative       Radiation treatment dates:   06/06/2017 -07/12/2017  Site/dose:     Left femur/ 3Gy x 10 fx Right femur/  3Gy x 10 fx Pelvis/ 3Gy x 10 fx Chest/ 3Gy x 10 fx L-Spine/ 3Gy x 10 fx  Beams/energy:  Photon/ 15X  Narrative: The patient tolerated radiation treatment relatively well.   Plan: The patient has completed radiation treatment. The patient will return to radiation oncology clinic for routine followup in one month. I advised them to call or return sooner if they have any questions or concerns related to their recovery or treatment.  ------------------------------------------------  Jodelle Gross, MD, PhD  This document serves as a record of services personally performed by Kyung Rudd, MD. It was created on his behalf by Valeta Harms, a trained medical scribe. The creation of this record is based on the scribe's personal observations and the provider's statements to them. This document has been checked and approved by the attending provider.

## 2017-07-31 NOTE — Telephone Encounter (Signed)
Gave patient avs and calendar with appts per 1/22 los.

## 2017-08-02 ENCOUNTER — Telehealth: Payer: Self-pay | Admitting: Nurse Practitioner

## 2017-08-02 ENCOUNTER — Telehealth: Payer: Self-pay | Admitting: *Deleted

## 2017-08-02 NOTE — Telephone Encounter (Signed)
-----   Message from Smith Mince, RN sent at 07/31/2017  2:54 PM EST ----- Regarding: Dr. Julien Nordmann: First time chemo First time Doxil. Tolerated well.

## 2017-08-02 NOTE — Telephone Encounter (Signed)
TCT patient for f/u to 1st chemo of Doxil on 07/31/17. Pt states he is doing ok-just some night sweats. No fever or chills. Denies diarrhea/nausea/vomiting.  Reviewed meds with him and he voiced understanding.  He is aware of upcoming appts.

## 2017-08-02 NOTE — Telephone Encounter (Signed)
Requesting refill of Diazepam  LOV 07/04/17 with A. Gayla Medicus, NP  [facility]  07/04/17  #[phone]

## 2017-08-02 NOTE — Telephone Encounter (Signed)
Copied from [place]. Topic: Quick Communication - See Telephone Encounter >> Aug 02, 2017  3:35 PM Boyd Kerbs wrote: CRM for notification. See Telephone encounter for:   Patient is out of Diazepam and pharmacy said no refills and has to call office .    [id number] [address], [place] [address] Phone: [phone] Fax: [phone]   08/02/17.

## 2017-08-03 ENCOUNTER — Other Ambulatory Visit: Payer: Self-pay | Admitting: Family

## 2017-08-03 DIAGNOSIS — M545 Low back pain, unspecified: Secondary | ICD-10-CM

## 2017-08-03 DIAGNOSIS — G8929 Other chronic pain: Secondary | ICD-10-CM

## 2017-08-03 MED ORDER — DIAZEPAM 5 MG PO TABS: 5.0000 mg | ORAL_TABLET | Freq: Two times a day (BID) | ORAL | 1 refills | Status: DC | PRN

## 2017-08-03 NOTE — Telephone Encounter (Signed)
Medication has been sent. Pt is aware.

## 2017-08-03 NOTE — Telephone Encounter (Signed)
Calling checking status, pt out of meds. Please advise

## 2017-08-03 NOTE — Telephone Encounter (Signed)
Pls advise on request below...Tanner [name]

## 2017-08-06 ENCOUNTER — Encounter: Payer: Self-pay | Admitting: *Deleted

## 2017-08-06 ENCOUNTER — Inpatient Hospital Stay: Payer: Medicare HMO

## 2017-08-06 DIAGNOSIS — C61 Malignant neoplasm of prostate: Secondary | ICD-10-CM

## 2017-08-06 DIAGNOSIS — Z5112 Encounter for antineoplastic immunotherapy: Secondary | ICD-10-CM | POA: Diagnosis not present

## 2017-08-06 DIAGNOSIS — C801 Malignant (primary) neoplasm, unspecified: Secondary | ICD-10-CM

## 2017-08-06 LAB — CBC WITH DIFFERENTIAL (CANCER CENTER ONLY)
BASOS PCT: 0 %
Basophils Absolute: 0 10*3/uL (ref 0.0–0.1)
EOS ABS: 0.3 10*3/uL (ref 0.0–0.5)
EOS PCT: 4 %
HCT: 33.7 % — ABNORMAL LOW (ref 38.4–49.9)
Hemoglobin: 11.1 g/dL — ABNORMAL LOW (ref 13.0–17.1)
Lymphocytes Relative: 3 %
Lymphs Abs: 0.2 10*3/uL — ABNORMAL LOW (ref 0.9–3.3)
MCH: 33 pg (ref 27.2–33.4)
MCHC: 32.9 g/dL (ref 32.0–36.0)
MCV: 100.3 fL — ABNORMAL HIGH (ref 79.3–98.0)
MONO ABS: 0.1 10*3/uL (ref 0.1–0.9)
Monocytes Relative: 2 %
Neutro Abs: 5.9 10*3/uL (ref 1.5–6.5)
Neutrophils Relative %: 91 %
PLATELETS: 178 10*3/uL (ref 140–400)
RBC: 3.36 MIL/uL — ABNORMAL LOW (ref 4.20–5.82)
RDW: 13.2 % (ref 11.0–15.6)
WBC: 6.5 10*3/uL (ref 4.0–10.3)

## 2017-08-06 LAB — CMP (CANCER CENTER ONLY)
ALBUMIN: 2.4 g/dL — AB (ref 3.5–5.0)
ALT: 7 U/L (ref 0–55)
AST: 15 U/L (ref 5–34)
Alkaline Phosphatase: 119 U/L (ref 40–150)
Anion gap: 11 (ref 3–11)
BUN: 13 mg/dL (ref 7–26)
CHLORIDE: 97 mmol/L — AB (ref 98–109)
CO2: 26 mmol/L (ref 22–29)
Calcium: 10.4 mg/dL (ref 8.4–10.4)
Creatinine: 0.81 mg/dL (ref 0.70–1.30)
GFR, Est AFR Am: >60 mL/min (ref >60)
Glucose, Bld: 88 mg/dL (ref 70–140)
POTASSIUM: 4 mmol/L (ref 3.5–5.1)
SODIUM: 134 mmol/L — AB (ref 136–145)
Total Bilirubin: 0.3 mg/dL (ref 0.2–1.2)
Total Protein: 7.2 g/dL (ref 6.4–8.3)

## 2017-08-06 NOTE — Telephone Encounter (Addendum)
[facility] calling to clarify this Rx for the pt. Pt states they need Ashleigh to acknowledge she is aware pt is on morphine and oxycodone due to his cancer.  Pt hopes to get today please.  [facility] [address]

## 2017-08-06 NOTE — Telephone Encounter (Signed)
Advised with pharmacy that the valium is ok to fill with morphine and oxycodone.

## 2017-08-06 NOTE — Telephone Encounter (Signed)
Yes I am aware. It is okay for the pharmacy to go ahead and fill the valium as well.

## 2017-08-06 NOTE — Progress Notes (Signed)
[facility]  Clinical Social Work was referred by APP to review and complete healthcare advance directives.  Clinical Social Worker met with patient and patients caregiver in [place] office.  The patient designated Darden Flemister as their primary healthcare agent.  Patient also completed healthcare living will.    Clinical Social Worker notarized documents and made copies for patient/family. Clinical Social Worker will send documents to medical records to be scanned into patient's chart. Clinical Social Worker encouraged patient/family to contact with any additional questions or concerns.  Tanner [name], MSW, LCSW, OSW-C Clinical Social Worker [facility] [phone]

## 2017-08-06 NOTE — Progress Notes (Signed)
Oncology Nurse Navigator Documentation  Oncology Nurse Navigator Flowsheets 08/06/2017  Navigator Location [facility]  Navigator Encounter Type Other/patient needs home health assessment. I updated Dr. Julien Nordmann. Order completed  Patient Visit Type Walk-in  Treatment Phase Treatment  Barriers/Navigation Needs Coordination of Care  Interventions Coordination of Care  Coordination of Care Home Health  Acuity Level 2  Acuity Level 2 Other  Time Spent with Patient 30

## 2017-08-06 NOTE — Telephone Encounter (Signed)
Please advise 

## 2017-08-06 NOTE — Telephone Encounter (Signed)
Pt states he contacted pharmacy and the pharmacy states office has not called them about the valium and did not give "special code." Please advise.

## 2017-08-07 NOTE — Telephone Encounter (Signed)
Spoke with pharmacy again and let them know that per Hollie Beach it is OK to give Valium to pt while he is taking morphine and oxycodone due to pt being a cancer pt.

## 2017-08-08 ENCOUNTER — Encounter: Payer: Self-pay | Admitting: *Deleted

## 2017-08-08 NOTE — Progress Notes (Signed)
Oncology Nurse Navigator Documentation  Oncology Nurse Navigator Flowsheets 08/08/2017  Navigator Location [facility]  Navigator Encounter Type Other/I called [facility] to see the referral on Tanner [name] has been completed.  They have gotten request and will follow up with patient.   Treatment Phase Treatment  Barriers/Navigation Needs Coordination of Care  Interventions Coordination of Care  Coordination of Care Other  Acuity Level 2  Acuity Level 2 Other  Time Spent with Patient 30

## 2017-08-09 ENCOUNTER — Encounter (HOSPITAL_COMMUNITY): Payer: Self-pay

## 2017-08-09 ENCOUNTER — Other Ambulatory Visit (HOSPITAL_COMMUNITY): Payer: Self-pay | Admitting: Dentistry

## 2017-08-10 ENCOUNTER — Telehealth: Payer: Self-pay | Admitting: *Deleted

## 2017-08-10 ENCOUNTER — Encounter (HOSPITAL_COMMUNITY): Payer: Self-pay | Admitting: Dentistry

## 2017-08-10 ENCOUNTER — Ambulatory Visit (HOSPITAL_COMMUNITY): Payer: Medicare HMO | Admitting: Dentistry

## 2017-08-10 VITALS — BP 80/58 | HR 92 | Temp 98.7°F

## 2017-08-10 DIAGNOSIS — K0889 Other specified disorders of teeth and supporting structures: Secondary | ICD-10-CM

## 2017-08-10 DIAGNOSIS — K029 Dental caries, unspecified: Secondary | ICD-10-CM

## 2017-08-10 DIAGNOSIS — C7951 Secondary malignant neoplasm of bone: Secondary | ICD-10-CM

## 2017-08-10 DIAGNOSIS — Z01818 Encounter for other preprocedural examination: Secondary | ICD-10-CM | POA: Diagnosis not present

## 2017-08-10 DIAGNOSIS — K036 Deposits [accretions] on teeth: Secondary | ICD-10-CM

## 2017-08-10 DIAGNOSIS — K031 Abrasion of teeth: Secondary | ICD-10-CM

## 2017-08-10 DIAGNOSIS — K117 Disturbances of salivary secretion: Secondary | ICD-10-CM

## 2017-08-10 DIAGNOSIS — K06010 Localized gingival recession, unspecified: Secondary | ICD-10-CM

## 2017-08-10 DIAGNOSIS — M264 Malocclusion, unspecified: Secondary | ICD-10-CM

## 2017-08-10 DIAGNOSIS — C801 Malignant (primary) neoplasm, unspecified: Secondary | ICD-10-CM | POA: Diagnosis not present

## 2017-08-10 DIAGNOSIS — K0530 Chronic periodontitis, unspecified: Secondary | ICD-10-CM

## 2017-08-10 DIAGNOSIS — R682 Dry mouth, unspecified: Secondary | ICD-10-CM

## 2017-08-10 DIAGNOSIS — M270 Developmental disorders of jaws: Secondary | ICD-10-CM

## 2017-08-10 DIAGNOSIS — K08409 Partial loss of teeth, unspecified cause, unspecified class: Secondary | ICD-10-CM

## 2017-08-10 NOTE — Telephone Encounter (Signed)
Patient presented in waiting room today stating his BP was 80/58 at the dentist office. vss here 101/62 97.9-101-18 oxygen saturaton 96%. Patient states he did not take his BP medication this am. Instructed patient to call his primary care physician with results of BP today. Wife also asked if dr Julien Nordmann would please order a lift chair for patient, from [facility].

## 2017-08-10 NOTE — Patient Instructions (Signed)
1. Patient is to follow-up with the symptomology clinic in the cancer center today for evaluation of low blood pressure and history of dizziness. Patient and daughter expressed understanding with this recommendation.  2. Patient is to discuss the plan of care for periodontal therapy, dental extractions with alveoloplasty, and possible dental restorations as needed with Dr. Julien Nordmann.  Patient initially does not wish to proceed with dental treatment at this time and understands that Xgeva therapy most likely will not be given without dental treatment. Patient expressed understanding about the potential risk for osteonecrosis of the jaw with invasive dental procedures after the Xgeva therapy has been given.  3. Patient was instructed to brush after meals and at bedtime. Patient expressed difficulty  With oral hygiene measures at this time due to difficulty with his arm strength of left and right arms. We discussed purchasing an electric tooth brush such as the Oral B [employer] product.  We also discussed use of chlorhexidine rinses on a twice daily basis. Patient currently does not wish to proceed with a prescription for this medication.  4.  Patient or her daughter may contact the Dental Medicine clinic to arrange for future dental treatment if so desired.  I will then contact Dr. Curt Bears concerning coordination of the existing chemotherapy with the dental plan of care.     We will also discuss the possible provision of dental procedures in the operating room with general anesthesia.   Lenn Cal, DDS

## 2017-08-10 NOTE — Progress Notes (Signed)
DENTAL CONSULTATION  Date of Consultation:  08/10/2017 Patient Name:   Tanner Tanner [name] Date of Birth:   08-10-1955 Medical Record Number: [id number]  VITALS: BP (!) 80/58   Pulse 92   Temp 98.7 F (37.1 C)   (low blood pressure reading was noted and patient was instructed to follow-up with the symptomology clinic at the cancer center after this appointment)  CHIEF COMPLAINT: Patient referred by Dr. Curt Tanner [name] for a dental consultation.  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 Patient also with a history of lung and bone metastases. Patient currently being treated with chemotherapy by Dr. Curt Tanner [name].  Patient with anticipated use of Xgeva therapy. Patient is now seen as part of a pre-Xgeva therapy dental protocol examination.  The patient currently denies acute toothaches, swellings, or abscesses. Patient has not seen a dentist in over 10 years. Patient had tooth #30 extracted at that time with no complications. This was Dr. Christene Tanner [name]. Patient denies having partial dentures. Patient had was in teeth extracted when he was much younger. Patient denies having dental phobia.  PROBLEM LIST: Patient Active Problem List   Diagnosis Date Noted  . 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 Goals of care, counseling/discussion 07/16/2017  . Chronic low back pain 07/04/2017  . Bone metastasis ([place]) 06/23/2017  . COPD with acute exacerbation ([place]) 05/15/2017  . Multiple lung nodules on CT   . Near syncope 05/03/2017  . Lung mass 05/03/2017  . Hyperlipidemia 09/20/2016  . Protein-calorie malnutrition ([place]) 08/17/2016  . Thoracic compression fracture, closed, initial encounter ([place]) 08/17/2016  . Alcohol dependence ([place]) 08/17/2016  . Tobacco dependence 08/17/2016  . Osteoporosis 08/17/2016  . Hip fracture ([place]) 08/16/2016  . Headache 02/24/2016  . Hypertensive urgency 02/03/2016  . Shortness of breath 07/28/2015  . Generalized anxiety  disorder 07/28/2015  . Medicare annual wellness visit, subsequent 05/24/2015  . Acute DVT of right tibial vein ([place]) 02/27/2015  . Tibia fracture 02/20/2015  . Syncope 02/18/2015  . Essential hypertension 02/18/2015  . History of stroke 02/18/2015  . Fracture of tibia, right, closed   . C. difficile colitis 09/11/2013  . UTI (urinary tract infection) 09/11/2013  . Essential hypertension, benign 09/11/2013  . Ileus ([place]) 09/10/2013  . Spastic hemiplegia affecting dominant side ([place]) 09/09/2013  . Ileus, postoperative ([place]) 09/09/2013  . Hypokalemia 09/09/2013  . Nonspecific (abnormal) findings on radiological and other examination of gastrointestinal tract 09/09/2013  . CVA (cerebral infarction) 09/05/2013  . 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 Malignant neoplasm of prostate ([place]) 08/29/2013  . Hyponatremia 12/11/2012    Class: Chronic  . Ankle fracture 12/10/2012    PMH: Past Medical History:  Diagnosis Date  . Alcohol abuse   . Alcoholic hepatitis   . Allergy   . Anxiety   . 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 C. difficile colitis   . Depression   . DVT (deep venous thrombosis) ([place]) 2015  . GERD (gastroesophageal reflux disease)   . Hypertension   . Ileus ([place]) 09/2013  . 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 Shortness of breath    new onset- occasionally-at rest and exertion  . Substance abuse ([place])   . TIA (transient ischemic attack) 08/2013    PSH: Past Surgical History:  Procedure Laterality Date  . HAND SURGERY Right   . HERNIA REPAIR    . INTRAMEDULLARY (IM) NAIL INTERTROCHANTERIC Right 08/17/2016  Procedure: INTRAMEDULLARY (IM) NAIL RIGHT HIP;  Surgeon: Tania Ade, MD;  Location: [facility];  Service: Orthopedics;  Laterality: Right;  . LYMPHADENECTOMY Bilateral 08/29/2013   Procedure: LYMPHADENECTOMY "BILATERAL PELVIC LYMPH NODE DISSECTION";  Surgeon: Molli Hazard, MD;  Location: [facility];  Service: Urology;  Laterality: Bilateral;   . ORIF ANKLE FRACTURE Right 12/10/2012   Procedure: OPEN REDUCTION INTERNAL FIXATION (ORIF) ANKLE FRACTURE;  Surgeon: Hessie Dibble, MD;  Location: [facility];  Service: Orthopedics;  Laterality: Right;  . ROBOT ASSISTED LAPAROSCOPIC RADICAL PROSTATECTOMY N/A 08/29/2013   Procedure: ROBOTIC ASSISTED LAPAROSCOPIC RADICAL PROSTATECTOMY LEVEL 2, Lysis of adhesions;  Surgeon: Molli Hazard, MD;  Location: [facility];  Service: Urology;  Laterality: N/A;    ALLERGIES: Allergies  Allergen Reactions  . Dilaudid [Hydromorphone Hcl] Nausea And Vomiting    Pre pt history  . Procaine Hcl Other (See Comments)    08/10/2017: The patient indicated that Novocaine was used by a dermatologist prior to obtaining biopsy on Tanner [name] back "many years ago". Patient experienced a syncopal reaction by Tanner [name] report. The patient has since had dental anesthetic without any adverse reactions.   . Sulfa Antibiotics Rash    MEDICATIONS: Current Outpatient Medications  Medication Sig Dispense Refill  . amLODipine (NORVASC) 10 MG tablet Take 1 tablet (10 mg total) daily by mouth. 90 tablet 1  . Aspirin-Acetaminophen (GOODY BODY PAIN) 500-325 MG PACK Take by mouth.    . bifidobacterium infantis (ALIGN) capsule Take 1 capsule daily by mouth.    . diazepam (VALIUM) 5 MG tablet Take 1 tablet (5 mg total) by mouth 2 (two) times daily as needed for anxiety or muscle spasms. 60 tablet 1  . emollient (BIAFINE) cream Apply 1 application topically 2 (two) times daily. Apply to skin areas after rad tx and bedtime daily,nothing 4 hours prior to rad txs    . FLUoxetine (PROZAC) 40 MG capsule TAKE 1 CAPSULE BY MOUTH EVERY DAY 90 capsule 1  . lisinopril (PRINIVIL,ZESTRIL) 40 MG tablet TAKE ONE (1) TABLET BY MOUTH EVERY DAY 90 tablet 1  . loperamide (IMODIUM) 2 MG capsule Take 2 capsules (4 mg total) by mouth every 8 (eight) hours as needed for diarrhea or loose stools. Over the counter 30 capsule 0  . magnesium hydroxide (PHILLIPS MILK OF  MAGNESIA) 400 MG/5ML suspension Take by mouth.    . metoprolol tartrate (LOPRESSOR) 100 MG tablet Take 1 tablet (100 mg total) by mouth 2 (two) times daily. 180 tablet 1  . morphine (MS CONTIN) 30 MG 12 hr tablet Take 1 tablet (30 mg total) by mouth every 12 (twelve) hours. 60 tablet 0  . omeprazole (PRILOSEC) 40 MG capsule Take 1 capsule (40 mg total) by mouth 2 (two) times daily. 120 capsule 0  . ondansetron (ZOFRAN) 8 MG tablet Take 1 tablet (8 mg total) by mouth every 8 (eight) hours as needed for nausea or vomiting. 30 tablet 3  . oxyCODONE (OXY IR/ROXICODONE) 5 MG immediate release tablet Take 1 tablet (5 mg total) by mouth every 6 (six) hours as needed for severe pain. 120 tablet 0  . simvastatin (ZOCOR) 20 MG tablet TAKE ONE (1) TABLET BY MOUTH EVERY DAY 90 tablet 1  . thiamine 100 MG tablet Take 1 tablet (100 mg total) by mouth daily. 30 tablet 3  . traZODone (DESYREL) 50 MG tablet Take 1 tablet (50 mg total) by mouth at bedtime as needed. for sleep 30 tablet 0   No current facility-administered medications for this  visit.     LABS: Lab Results  Component Value Date   WBC 6.5 08/06/2017   HGB 11.8 (L) 07/31/2017   HCT 33.7 (L) 08/06/2017   MCV 100.3 (H) 08/06/2017   PLT 178 08/06/2017      Component Value Date/Time   NA 134 (L) 08/06/2017 1015   NA 134 (L) 05/24/2017 1223   K 4.0 08/06/2017 1015   K 4.2 05/24/2017 1223   CL 97 (L) 08/06/2017 1015   CO2 26 08/06/2017 1015   CO2 25 05/24/2017 1223   GLUCOSE 88 08/06/2017 1015   GLUCOSE 92 05/24/2017 1223   BUN 13 08/06/2017 1015   BUN 10.7 05/24/2017 1223   CREATININE 0.72 07/31/2017 0810   CREATININE 0.9 05/24/2017 1223   CALCIUM 10.4 08/06/2017 1015   CALCIUM 9.3 05/24/2017 1223   GFRNONAA >60 08/06/2017 1015   GFRAA >60 08/06/2017 1015   Lab Results  Component Value Date   INR 0.97 07/05/2017   INR 0.97 06/01/2017   INR 0.98 08/17/2016   No results found for: PTT  SOCIAL HISTORY: Social History    Socioeconomic History  . Marital status: Divorced    Spouse name: Not on file  . Number of children: 1  . Years of education: 65  . Highest education level: Not on file  Social Needs  . Financial resource strain: Not on file  . Food insecurity - worry: Not on file  . Food insecurity - inability: Not on file  . Transportation needs - medical: Not on file  . Transportation needs - non-medical: Not on file  Occupational History  . Occupation: disabled    Comment: Fingers, scolisosis,   Tobacco Use  . Smoking status: Former Smoker    Packs/day: 1.50    Years: 30.00    Pack years: 45.00    Types: Cigarettes    Last attempt to quit: 03/09/2017    Years since quitting: 0.4  . Smokeless tobacco: Never Used  Substance and Sexual Activity  . Alcohol use: Yes    Alcohol/week: 3.0 - 6.0 oz    Types: 5 - 10 Cans of beer per week  . Drug use: Yes    Types: Marijuana  . Sexual activity: Not on file  Other Topics Concern  . Not on file  Social History Narrative   Denies    FAMILY HISTORY: Family History  Problem Relation Age of Onset  . Lung cancer Father        smoker  . COPD Father   . Emphysema Father   . Arthritis Mother   . Pancreatic cancer Sister     REVIEW OF SYSTEMS: Reviewed with the patient as per History of present illness. Psych: Patient denies having dental phobia.  DENTAL HISTORY: CHIEF COMPLAINT: Patient referred by Dr. Curt Tanner [name] for a dental consultation.  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 Patient currently being treated with chemotherapy by Dr. Curt Tanner [name].  Patient with anticipated use of Xgeva therapy. Patient is now seen as part of a pre-Xgeva therapy dental protocol examination.  The patient currently denies acute toothaches, swellings, or abscesses. Patient has not seen a dentist in over 10 years. Patient had tooth #30 extracted at that time  with no complications. This was Dr. Christene Tanner [name]. Patient denies having partial dentures. Patient had was in teeth extracted when he was much younger. Patient denies having dental phobia.  DENTAL EXAMINATION:  GENERAL:  The patient is a well-developed male in no acute distress. Patient presents in a wheelchair. HEAD AND NECK:  There is no palpable neck lymphadenopathy. The patient denies acute TMJ symptoms. INTRAORAL EXAM:  The patient has xerostomia. Patient has a mid palatal torus. There is no evidence of oral abscess formation. DENTITION:  Patient has multiple missing teeth numbers 1, 16, 17, 30, and 32. PERIODONTAL:  Patient has chronic periodontitis with plaque and calculus accumulations, gingival recession, and tooth mobility as charted. There is incipient to severe bone loss noted. DENTAL CARIES/SUBOPTIMAL RESTORATIONS: Multiple dental caries and flexure lesions are noted as per dental charting form. ENDODONTIC:  Patient denies any current acute pulpitis symptoms. There is no evidence of periapical pathology.  CROWN AND BRIDGE: There are no crown or bridge restorations noted. PROSTHODONTIC:  There are no partial dentures. OCCLUSION: The patient has a poor occlusal scheme but a stable occlusion.  RADIOGRAPHIC INTERPRETATION: An orthopantogram was taken and supplemented with a full series of dental radiographs. There are multiple missing teeth. There is incipient to severe bone loss. Radiographic calculus is noted. There is supra-eruption and drifting of the unopposed teeth into the edentulous areas.  ASSESSMENTS: 1. Malignant tumor, spindle cell type 2. Bone metastases 3. Pre-Xgeva therapy dental protocol 4. Chronic periodontitis with bone loss 5. Gingival recession 6. Accretions 7. Loose teeth 8. Dental caries 9. Multiple flexure lesions 10. Mid palatal torus 11. Multiple missing teeth 12. Supra-eruption and drifting of the unopposed teeth into the edentulous areas 13.  Malocclusion 14. Xerostomia  PLAN/RECOMMENDATIONS: 1. I discussed the risks, benefits, and complications of various treatment options with the patient in relationship to Tanner [name] medical and dental conditions, anticipated use of Xgeva therapy, and risk for osteonecrosis of the jaw with invasive dental procedures after administration of the Xgeva therapy.  We discussed various treatment options to include no treatment, multiple extractions with alveoloplasty, pre-prosthetic surgery as indicated, periodontal therapy, dental restorations, root canal therapy, crown and bridge therapy, implant therapy, and replacement of missing teeth as indicated. The patient currently wishes to defer any dental treatment at this time. Patient wishes to discuss provision of dental treatment prior to use of Xgeva therapy with Dr. Curt Tanner [name] in relationship to Tanner [name] overall prognosis.  If patient wishes to proceed with dental treatment, I will discuss coordination of current chemotherapy regimen with Dr. Curt Tanner [name] as indicated.  I will also discuss  dental care in the operating room with general anesthesia to provide initial periodontal therapy and dental extraction procedures. I also discussed use of chlorhexidine rinses to aid in disinfection of the oral cavity at t Tanner [name] time. Patient refused a prescription for chlorhexidine rinses at this time. I also discussed use of an electric tooth brush to aid in oral hygiene care at this time. Patient will consider purchasing this electric tooth brush.  2. Discussion of findings with medical team and coordination of future medical and dental care as needed.  I spent in excess of  120 minutes during the conduct of this consultation and >50% of this time involved direct face-to-face encounter for counseling and/or coordination of the patient's care.    Lenn Cal, DDS

## 2017-08-13 ENCOUNTER — Other Ambulatory Visit: Payer: Self-pay | Admitting: Medical Oncology

## 2017-08-13 ENCOUNTER — Other Ambulatory Visit: Payer: Self-pay

## 2017-08-13 DIAGNOSIS — C801 Malignant (primary) neoplasm, unspecified: Secondary | ICD-10-CM

## 2017-08-13 DIAGNOSIS — C61 Malignant neoplasm of prostate: Secondary | ICD-10-CM

## 2017-08-14 ENCOUNTER — Inpatient Hospital Stay: Payer: Medicare HMO | Attending: Adult Health | Admitting: Oncology

## 2017-08-14 ENCOUNTER — Encounter: Payer: Self-pay | Admitting: Oncology

## 2017-08-14 ENCOUNTER — Inpatient Hospital Stay: Payer: Medicare HMO

## 2017-08-14 VITALS — BP 95/58 | HR 81 | Temp 98.5°F | Resp 18 | Ht 70.0 in

## 2017-08-14 DIAGNOSIS — Z9221 Personal history of antineoplastic chemotherapy: Secondary | ICD-10-CM | POA: Diagnosis not present

## 2017-08-14 DIAGNOSIS — K5900 Constipation, unspecified: Secondary | ICD-10-CM

## 2017-08-14 DIAGNOSIS — Z8546 Personal history of malignant neoplasm of prostate: Secondary | ICD-10-CM

## 2017-08-14 DIAGNOSIS — Z923 Personal history of irradiation: Secondary | ICD-10-CM | POA: Insufficient documentation

## 2017-08-14 DIAGNOSIS — Z882 Allergy status to sulfonamides status: Secondary | ICD-10-CM | POA: Insufficient documentation

## 2017-08-14 DIAGNOSIS — Z79899 Other long term (current) drug therapy: Secondary | ICD-10-CM

## 2017-08-14 DIAGNOSIS — Z885 Allergy status to narcotic agent status: Secondary | ICD-10-CM | POA: Diagnosis not present

## 2017-08-14 DIAGNOSIS — I10 Essential (primary) hypertension: Secondary | ICD-10-CM | POA: Diagnosis not present

## 2017-08-14 DIAGNOSIS — C7951 Secondary malignant neoplasm of bone: Secondary | ICD-10-CM

## 2017-08-14 DIAGNOSIS — M62838 Other muscle spasm: Secondary | ICD-10-CM | POA: Diagnosis not present

## 2017-08-14 DIAGNOSIS — M25519 Pain in unspecified shoulder: Secondary | ICD-10-CM

## 2017-08-14 DIAGNOSIS — C61 Malignant neoplasm of prostate: Secondary | ICD-10-CM

## 2017-08-14 DIAGNOSIS — K219 Gastro-esophageal reflux disease without esophagitis: Secondary | ICD-10-CM | POA: Diagnosis not present

## 2017-08-14 DIAGNOSIS — C771 Secondary and unspecified malignant neoplasm of intrathoracic lymph nodes: Secondary | ICD-10-CM | POA: Diagnosis not present

## 2017-08-14 DIAGNOSIS — R509 Fever, unspecified: Secondary | ICD-10-CM | POA: Diagnosis not present

## 2017-08-14 DIAGNOSIS — Z7982 Long term (current) use of aspirin: Secondary | ICD-10-CM | POA: Insufficient documentation

## 2017-08-14 DIAGNOSIS — C801 Malignant (primary) neoplasm, unspecified: Secondary | ICD-10-CM | POA: Diagnosis not present

## 2017-08-14 DIAGNOSIS — R911 Solitary pulmonary nodule: Secondary | ICD-10-CM | POA: Diagnosis not present

## 2017-08-14 LAB — CBC WITH DIFFERENTIAL (CANCER CENTER ONLY)
Basophils Absolute: 0 10*3/uL (ref 0.0–0.1)
Basophils Relative: 1 %
EOS ABS: 0 10*3/uL (ref 0.0–0.5)
EOS PCT: 2 %
HCT: 29.8 % — ABNORMAL LOW (ref 38.4–49.9)
Hemoglobin: 10.2 g/dL — ABNORMAL LOW (ref 13.0–17.1)
LYMPHS ABS: 0.1 10*3/uL — AB (ref 0.9–3.3)
LYMPHS PCT: 9 %
MCH: 33.4 pg (ref 27.2–33.4)
MCHC: 34.1 g/dL (ref 32.0–36.0)
MCV: 98.1 fL — AB (ref 79.3–98.0)
MONOS PCT: 18 %
Monocytes Absolute: 0.2 10*3/uL (ref 0.1–0.9)
NEUTROS PCT: 70 %
Neutro Abs: 1 10*3/uL — ABNORMAL LOW (ref 1.5–6.5)
Platelet Count: 101 10*3/uL — ABNORMAL LOW (ref 140–400)
RBC: 3.04 MIL/uL — AB (ref 4.20–5.82)
RDW: 13.4 % (ref 11.0–14.6)
WBC: 1.4 10*3/uL — AB (ref 4.0–10.3)

## 2017-08-14 LAB — CMP (CANCER CENTER ONLY)
ALK PHOS: 99 U/L (ref 40–150)
ALT: 10 U/L (ref 0–55)
ANION GAP: 12 — AB (ref 3–11)
AST: 21 U/L (ref 5–34)
Albumin: 2.3 g/dL — ABNORMAL LOW (ref 3.5–5.0)
BUN: 8 mg/dL (ref 7–26)
CALCIUM: 10 mg/dL (ref 8.4–10.4)
CO2: 26 mmol/L (ref 22–29)
Chloride: 95 mmol/L — ABNORMAL LOW (ref 98–109)
Creatinine: 0.76 mg/dL (ref 0.70–1.30)
Glucose, Bld: 114 mg/dL (ref 70–140)
Potassium: 4 mmol/L (ref 3.5–5.1)
SODIUM: 133 mmol/L — AB (ref 136–145)
TOTAL PROTEIN: 7 g/dL (ref 6.4–8.3)
Total Bilirubin: 0.3 mg/dL (ref 0.2–1.2)

## 2017-08-14 MED ORDER — POLYETHYLENE GLYCOL 3350 17 GM/SCOOP PO POWD: 1.0000 | Freq: Once | ORAL | 0 refills | Status: AC

## 2017-08-14 MED ORDER — MORPHINE SULFATE ER 30 MG PO TBCR: 30.0000 mg | EXTENDED_RELEASE_TABLET | Freq: Three times a day (TID) | ORAL | 0 refills | Status: DC

## 2017-08-14 MED ORDER — DOCUSATE SODIUM 100 MG PO CAPS: 100.0000 mg | ORAL_CAPSULE | Freq: Two times a day (BID) | ORAL | 0 refills | Status: DC

## 2017-08-14 NOTE — Assessment & Plan Note (Signed)
This is a very pleasant 62 year old white male with metastatic neoplasm of unknown type and primary at this point, suspicious for spindle cell neoplasm.  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  PDL 1 testing at [facility] is 5%. The patient underwent a course of palliative radiotherapy to the right pelvic area. Patient is currently on treatment with Doxil 50 mg/M2 every 4 weeks. Status post 1 cycle.  Tolerated the first cycle of chemotherapy well overall, but he is having increased pain and constipation.   For pain management, the patient will increase his MS Contin to 30 mg every 8 hours.  A new prescription was written for him today.  He will also continue oxycodone 5 mg every 6 hours.  The patient did not realize he could take up to 4 times a day and he was advised to do so.  We discussed pain medication can cause constipation.  He was instructed to get magnesium citrate to drink one half bottle.  If no results in 4 hours, he is to drink the remainder of the bottle.  He was instructed to begin Colace 100 mg twice a day along with MiraLAX 1 capful mixed in water or juice daily.  Prescriptions for Colace and MiraLAX were sent to his pharmacy.  We briefly discussed hospice today.  I have explained her services.  We decided not to make any decisions regarding hospice until he have his pain better controlled and his constipation improved.  Patient is in agreement to this plan.  I have advised him to follow-up with his primary care provider regarding further management of his medications for hypertension.  I will see him back for follow-up visit in 2 weeks for evaluation prior to cycle #2 of his chemotherapy.  He was advised to call immediately if he has any concerning symptoms in the interval. The patient voices understanding of current disease status and treatment options and is in agreement with the current care plan.

## 2017-08-14 NOTE — Progress Notes (Signed)
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  Hepresented with left upper lobe pulmonary nodule in addition to left prevascular lymph node and multiple metastatic bone disease.  PDL 1 5% (performed at [facility] on 07/23/2017  PRIOR THERAPY: Palliative radiotherapy to the metastatic bone disease in the right pelvic area under the care of Dr. Sondra Come.  CURRENT THERAPY: Systemic chemotherapy with Doxil 50 mg/M2 every 4 weeks with Neulasta support.  First dose July 31, 2017.  Status post 1 cycle.  INTERVAL HISTORY: Tanner [name] 62 y.o. male returns for routine follow-up visit accompanied by his ex-wife.  Patient received his first cycle of chemotherapy 2 weeks ago and tolerated it fairly well.  He is having increased pain in his shoulder blades.  Currently takes MS Contin 30 mg twice a day with oxycodone 5 mg 3 times a day.  He also has a prescription for Valium 5 mg twice a day for muscle spasms from his primary care provider.  Patient reports that his pain is a 10 out of 10.  He is not getting much relief at this time.  His other issue today is constipation.  Last bowel movement was about 2 weeks ago.  He has not taken anything but a stool softener once a day.  He also notices that his blood pressure runs low.  He is currently on several blood pressure medications from his primary care provider.  He was advised to see his primary care provider last week for follow-up of his hypertension and management of his blood pressure medications.  He reports that he was seen in the office and was told that his blood pressure was okay and no changes were needed.  Today the patient reports that he has had low-grade fevers in the evenings up to 99.0.  Denies chest pain  reports shortness of breath with exertion.  Denies shortness of breath at rest.  Denies cough and hemoptysis.  Denies nausea, vomiting, diarrhea.  The patient has inquired about what hospice services include.  The patient is here for evaluation and repeat blood work.  MEDICAL HISTORY: Past Medical History:  Diagnosis Date  . Alcohol abuse   . Alcoholic hepatitis   . Allergy   . Anxiety   . 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 C. difficile colitis   . Depression   . DVT (deep venous thrombosis) ([place]) 2015  . GERD (gastroesophageal reflux disease)   . Hypertension   . Ileus ([place]) 09/2013  . Prostate cancer ([place]) 05/22/13   Gleason 4+3=7  . Shortness of breath    new onset- occasionally-at rest and exertion  . Substance abuse ([place])   . TIA (transient ischemic attack) 08/2013    ALLERGIES:  is allergic to dilaudid [hydromorphone hcl]; procaine hcl; and sulfa antibiotics.  MEDICATIONS:  Current Outpatient Medications  Medication Sig Dispense Refill  . amLODipine (NORVASC) 10 MG tablet Take 1 tablet (10 mg total) daily by mouth. 90 tablet 1  . Aspirin-Acetaminophen (GOODY BODY PAIN) 500-325 MG PACK Take by mouth.    . diazepam (VALIUM) 5 MG tablet Take 1 tablet (5 mg total) by mouth 2 (two) times daily as needed for anxiety or muscle spasms. 60 tablet 1  . FLUoxetine (PROZAC) 40 MG capsule TAKE 1 CAPSULE BY MOUTH  EVERY DAY 90 capsule 1  . lisinopril (PRINIVIL,ZESTRIL) 40 MG tablet TAKE ONE (1) TABLET BY MOUTH EVERY DAY 90 tablet 1  . metoprolol tartrate (LOPRESSOR) 100 MG tablet Take 1 tablet (100 mg total) by mouth 2 (two) times daily. 180 tablet 1  . morphine (MS CONTIN) 30 MG 12 hr tablet Take 1 tablet (30 mg total) by mouth every 8 (eight) hours. 90 tablet 0  . omeprazole (PRILOSEC) 40 MG capsule Take 1 capsule (40 mg total) by mouth 2 (two) times daily. 120 capsule 0  . ondansetron (ZOFRAN) 8 MG tablet Take 1 tablet (8 mg total) by mouth every 8 (eight) hours as needed for nausea or  vomiting. 30 tablet 3  . oxyCODONE (OXY IR/ROXICODONE) 5 MG immediate release tablet Take 1 tablet (5 mg total) by mouth every 6 (six) hours as needed for severe pain. 120 tablet 0  . simvastatin (ZOCOR) 20 MG tablet TAKE ONE (1) TABLET BY MOUTH EVERY DAY 90 tablet 1  . thiamine 100 MG tablet Take 1 tablet (100 mg total) by mouth daily. 30 tablet 3  . traZODone (DESYREL) 50 MG tablet Take 1 tablet (50 mg total) by mouth at bedtime as needed. for sleep 30 tablet 0  . bifidobacterium infantis (ALIGN) capsule Take 1 capsule daily by mouth.    . docusate sodium (COLACE) 100 MG capsule Take 1 capsule (100 mg total) by mouth 2 (two) times daily. 10 capsule 0  . emollient (BIAFINE) cream Apply 1 application topically 2 (two) times daily. Apply to skin areas after rad tx and bedtime daily,nothing 4 hours prior to rad txs    . loperamide (IMODIUM) 2 MG capsule Take 2 capsules (4 mg total) by mouth every 8 (eight) hours as needed for diarrhea or loose stools. Over the counter (Patient not taking: Reported on 08/14/2017) 30 capsule 0  . magnesium hydroxide (PHILLIPS MILK OF MAGNESIA) 400 MG/5ML suspension Take by mouth.    . polyethylene glycol powder (MIRALAX) powder Take 255 g by mouth once for 1 dose. 255 g 0   No current facility-administered medications for this visit.     SURGICAL HISTORY:  Past Surgical History:  Procedure Laterality Date  . HAND SURGERY Right   . HERNIA REPAIR    . INTRAMEDULLARY (IM) NAIL INTERTROCHANTERIC Right 08/17/2016   Procedure: INTRAMEDULLARY (IM) NAIL RIGHT HIP;  Surgeon: Tania Ade, MD;  Location: [facility];  Service: Orthopedics;  Laterality: Right;  . LYMPHADENECTOMY Bilateral 08/29/2013   Procedure: LYMPHADENECTOMY "BILATERAL PELVIC LYMPH NODE DISSECTION";  Surgeon: Molli Hazard, MD;  Location: [facility];  Service: Urology;  Laterality: Bilateral;  . ORIF ANKLE FRACTURE Right 12/10/2012   Procedure: OPEN REDUCTION INTERNAL FIXATION (ORIF) ANKLE FRACTURE;  Surgeon:  Hessie Dibble, MD;  Location: [facility];  Service: Orthopedics;  Laterality: Right;  . ROBOT ASSISTED LAPAROSCOPIC RADICAL PROSTATECTOMY N/A 08/29/2013   Procedure: ROBOTIC ASSISTED LAPAROSCOPIC RADICAL PROSTATECTOMY LEVEL 2, Lysis of adhesions;  Surgeon: Molli Hazard, MD;  Location: [facility];  Service: Urology;  Laterality: N/A;    REVIEW OF SYSTEMS:   Review of Systems  Constitutional: Negative for chills, fatigue. Reports low-grade fevers up to 99.0 decreased appetite.  The patient could not stand to be weighed today. HENT:   Negative for mouth sores, nosebleeds, sore throat and trouble swallowing.   Eyes: Negative for eye problems and icterus.  Respiratory: Negative for cough, hemoptysis, shortness of breath at rest and wheezing.  Positive for shortness of breath with exertion.  Cardiovascular: Negative for chest pain and leg swelling.  Gastrointestinal: Negative for abdominal pain, diarrhea, nausea and vomiting. Positive for constipation. Genitourinary: Negative for bladder incontinence, difficulty urinating, dysuria, frequency and hematuria.   Musculoskeletal: Negative for back pain, neck pain and neck stiffness. Positive for bilateral shoulder pain. Skin: Negative for itching and rash.  Neurological: Negative for dizziness, extremity weakness, gait problem, headaches, light-headedness and seizures.  Hematological: Negative for adenopathy. Does not bruise/bleed easily.  Psychiatric/Behavioral: Negative for confusion, depression and sleep disturbance. The patient is not nervous/anxious.     PHYSICAL EXAMINATION:  Blood pressure (!) 95/58, pulse 81, temperature 98.5 F (36.9 C), temperature source Oral, resp. rate 18, height 5\' 10"  (1.778 m), SpO2 100 %.  ECOG PERFORMANCE STATUS: 1 - Symptomatic but completely ambulatory  Physical Exam  Constitutional: Oriented to person, place, and time.  Appears to be in pain at times.  HENT:  Head: Normocephalic and atraumatic.   Mouth/Throat: Oropharynx is clear and moist. No oropharyngeal exudate.  Eyes: Conjunctivae are normal. Right eye exhibits no discharge. Left eye exhibits no discharge. No scleral icterus.  Neck: Normal range of motion. Neck supple.  Cardiovascular: Normal rate, regular rhythm, normal heart sounds and intact distal pulses.   Pulmonary/Chest: Effort normal and breath sounds normal. No respiratory distress. No wheezes. No rales.  Abdominal: Soft. Bowel sounds are normal. Exhibits no distension and no mass. There is no tenderness.  Musculoskeletal: Normal range of motion. Exhibits no edema.  Lymphadenopathy:    No cervical adenopathy.  Neurological: Alert and oriented to person, place, and time. Exhibits normal muscle tone. Coordination normal.  Skin: Skin is warm and dry. No rash noted. Not diaphoretic. No erythema. No pallor.  Psychiatric: Mood, memory and judgment normal.  Vitals reviewed.  LABORATORY DATA: Lab Results  Component Value Date   WBC 1.4 (L) 08/14/2017   HGB 11.8 (L) 07/31/2017   HCT 29.8 (L) 08/14/2017   MCV 98.1 (H) 08/14/2017   PLT 101 (L) 08/14/2017      Chemistry      Component Value Date/Time   NA 133 (L) 08/14/2017 1035   NA 134 (L) 05/24/2017 1223   K 4.0 08/14/2017 1035   K 4.2 05/24/2017 1223   CL 95 (L) 08/14/2017 1035   CO2 26 08/14/2017 1035   CO2 25 05/24/2017 1223   BUN 8 08/14/2017 1035   BUN 10.7 05/24/2017 1223   CREATININE 0.72 07/31/2017 0810   CREATININE 0.9 05/24/2017 1223      Component Value Date/Time   CALCIUM 10.0 08/14/2017 1035   CALCIUM 9.3 05/24/2017 1223   ALKPHOS 99 08/14/2017 1035   ALKPHOS 121 05/24/2017 1223   AST 21 08/14/2017 1035   AST 19 05/24/2017 1223   ALT 10 08/14/2017 1035   ALT 11 05/24/2017 1223   BILITOT 0.3 08/14/2017 1035   BILITOT 0.23 05/24/2017 1223       RADIOGRAPHIC STUDIES:  No results found.   ASSESSMENT/PLAN:  Malignant tumor, spindle cell type [facility]) This is a very pleasant 62 year old  white male with metastatic neoplasm of unknown type and primary at this point, suspicious for spindle cell neoplasm.  Review of the pathology report at [facility] also indicated that this could be non-small cell carcinoma, large cell.  PDL 1 testing at [facility] is 5%. The patient underwent a course of palliative radiotherapy to the right pelvic area. Patient is currently on treatment with Doxil 50 mg/M2 every 4 weeks. Status post 1 cycle.  Tolerated the  first cycle of chemotherapy well overall, but he is having increased pain and constipation.   For pain management, the patient will increase his MS Contin to 30 mg every 8 hours.  A new prescription was written for him today.  He will also continue oxycodone 5 mg every 6 hours.  The patient did not realize he could take up to 4 times a day and he was advised to do so.  We discussed pain medication can cause constipation.  He was instructed to get magnesium citrate to drink one half bottle.  If no results in 4 hours, he is to drink the remainder of the bottle.  He was instructed to begin Colace 100 mg twice a day along with MiraLAX 1 capful mixed in water or juice daily.  Prescriptions for Colace and MiraLAX were sent to his pharmacy.  We briefly discussed hospice today.  I have explained her services.  We decided not to make any decisions regarding hospice until he have his pain better controlled and his constipation improved.  Patient is in agreement to this plan.  I have advised him to follow-up with his primary care provider regarding further management of his medications for hypertension.  I will see him back for follow-up visit in 2 weeks for evaluation prior to cycle #2 of his chemotherapy.  He was advised to call immediately if he has any concerning symptoms in the interval. The patient voices understanding of current disease status and treatment options and is in agreement with the current care plan.  No orders of the defined types were  placed in this encounter.  Mikey Bussing, DNP, AGPCNP-BC, AOCNP 08/14/17

## 2017-08-14 NOTE — Patient Instructions (Signed)
Get Magnesium citrate. Drink 1/2 bottle. If no results in 4 hours, drink the remainder of the bottle. Take a stool softener twice a day along with MirLax 1 capful mixed in water or juice daily.  Monitor temperature. Call for fever of 100.5 or higher.

## 2017-08-16 ENCOUNTER — Telehealth: Payer: Self-pay | Admitting: Medical Oncology

## 2017-08-16 NOTE — Telephone Encounter (Signed)
Tanner [name] stated pt has home health benefit and currently he cannot bath himself or walk. His ex wife has been helping him. Tanner [name] requested approval for home health. Per Cyril Mourning ,  NP-it  okay for home health evaluation and treatment. Tanner [name] will fax form for approval and then they will refer pt to home health. Fax number given.

## 2017-08-17 ENCOUNTER — Other Ambulatory Visit: Payer: Self-pay | Admitting: Medical Oncology

## 2017-08-17 DIAGNOSIS — C801 Malignant (primary) neoplasm, unspecified: Secondary | ICD-10-CM

## 2017-08-19 ENCOUNTER — Other Ambulatory Visit: Payer: Self-pay | Admitting: Nurse Practitioner

## 2017-08-20 ENCOUNTER — Other Ambulatory Visit: Payer: Self-pay

## 2017-08-20 ENCOUNTER — Emergency Department (HOSPITAL_COMMUNITY): Payer: Medicare HMO

## 2017-08-20 ENCOUNTER — Inpatient Hospital Stay (HOSPITAL_COMMUNITY)
Admission: EM | Admit: 2017-08-20 | Discharge: 2017-09-04 | DRG: 542 | Disposition: A | Discharge status: Hospice | Payer: Medicare HMO | Attending: Internal Medicine | Admitting: Internal Medicine

## 2017-08-20 DIAGNOSIS — E440 Moderate protein-calorie malnutrition: Secondary | ICD-10-CM

## 2017-08-20 DIAGNOSIS — Z515 Encounter for palliative care: Secondary | ICD-10-CM | POA: Diagnosis not present

## 2017-08-20 DIAGNOSIS — C419 Malignant neoplasm of bone and articular cartilage, unspecified: Secondary | ICD-10-CM | POA: Diagnosis not present

## 2017-08-20 DIAGNOSIS — J9621 Acute and chronic respiratory failure with hypoxia: Secondary | ICD-10-CM | POA: Diagnosis present

## 2017-08-20 DIAGNOSIS — Z5111 Encounter for antineoplastic chemotherapy: Secondary | ICD-10-CM | POA: Diagnosis not present

## 2017-08-20 DIAGNOSIS — R069 Unspecified abnormalities of breathing: Secondary | ICD-10-CM | POA: Diagnosis not present

## 2017-08-20 DIAGNOSIS — C799 Secondary malignant neoplasm of unspecified site: Secondary | ICD-10-CM | POA: Diagnosis not present

## 2017-08-20 DIAGNOSIS — Z882 Allergy status to sulfonamides status: Secondary | ICD-10-CM

## 2017-08-20 DIAGNOSIS — F411 Generalized anxiety disorder: Secondary | ICD-10-CM | POA: Diagnosis present

## 2017-08-20 DIAGNOSIS — D6481 Anemia due to antineoplastic chemotherapy: Secondary | ICD-10-CM | POA: Diagnosis present

## 2017-08-20 DIAGNOSIS — Z79899 Other long term (current) drug therapy: Secondary | ICD-10-CM

## 2017-08-20 DIAGNOSIS — C787 Secondary malignant neoplasm of liver and intrahepatic bile duct: Secondary | ICD-10-CM | POA: Diagnosis present

## 2017-08-20 DIAGNOSIS — J189 Pneumonia, unspecified organism: Secondary | ICD-10-CM | POA: Diagnosis present

## 2017-08-20 DIAGNOSIS — Z6822 Body mass index (BMI) 22.0-22.9, adult: Secondary | ICD-10-CM

## 2017-08-20 DIAGNOSIS — K219 Gastro-esophageal reflux disease without esophagitis: Secondary | ICD-10-CM | POA: Diagnosis present

## 2017-08-20 DIAGNOSIS — R0902 Hypoxemia: Secondary | ICD-10-CM

## 2017-08-20 DIAGNOSIS — R0789 Other chest pain: Secondary | ICD-10-CM | POA: Diagnosis not present

## 2017-08-20 DIAGNOSIS — M4690 Unspecified inflammatory spondylopathy, site unspecified: Secondary | ICD-10-CM | POA: Diagnosis present

## 2017-08-20 DIAGNOSIS — Z7189 Other specified counseling: Secondary | ICD-10-CM | POA: Diagnosis not present

## 2017-08-20 DIAGNOSIS — C499 Malignant neoplasm of connective and soft tissue, unspecified: Secondary | ICD-10-CM | POA: Diagnosis not present

## 2017-08-20 DIAGNOSIS — C7951 Secondary malignant neoplasm of bone: Principal | ICD-10-CM | POA: Diagnosis present

## 2017-08-20 DIAGNOSIS — F329 Major depressive disorder, single episode, unspecified: Secondary | ICD-10-CM | POA: Diagnosis present

## 2017-08-20 DIAGNOSIS — J449 Chronic obstructive pulmonary disease, unspecified: Secondary | ICD-10-CM | POA: Diagnosis not present

## 2017-08-20 DIAGNOSIS — J9601 Acute respiratory failure with hypoxia: Secondary | ICD-10-CM | POA: Diagnosis not present

## 2017-08-20 DIAGNOSIS — I10 Essential (primary) hypertension: Secondary | ICD-10-CM | POA: Diagnosis not present

## 2017-08-20 DIAGNOSIS — G92 Toxic encephalopathy: Secondary | ICD-10-CM | POA: Diagnosis not present

## 2017-08-20 DIAGNOSIS — J9811 Atelectasis: Secondary | ICD-10-CM | POA: Diagnosis not present

## 2017-08-20 DIAGNOSIS — Z9221 Personal history of antineoplastic chemotherapy: Secondary | ICD-10-CM

## 2017-08-20 DIAGNOSIS — Z7982 Long term (current) use of aspirin: Secondary | ICD-10-CM

## 2017-08-20 DIAGNOSIS — G893 Neoplasm related pain (acute) (chronic): Secondary | ICD-10-CM

## 2017-08-20 DIAGNOSIS — R339 Retention of urine, unspecified: Secondary | ICD-10-CM | POA: Diagnosis present

## 2017-08-20 DIAGNOSIS — M25511 Pain in right shoulder: Secondary | ICD-10-CM | POA: Diagnosis present

## 2017-08-20 DIAGNOSIS — E873 Alkalosis: Secondary | ICD-10-CM | POA: Diagnosis not present

## 2017-08-20 DIAGNOSIS — R52 Pain, unspecified: Secondary | ICD-10-CM

## 2017-08-20 DIAGNOSIS — Z8546 Personal history of malignant neoplasm of prostate: Secondary | ICD-10-CM

## 2017-08-20 DIAGNOSIS — Z66 Do not resuscitate: Secondary | ICD-10-CM | POA: Diagnosis present

## 2017-08-20 DIAGNOSIS — Z923 Personal history of irradiation: Secondary | ICD-10-CM

## 2017-08-20 DIAGNOSIS — R918 Other nonspecific abnormal finding of lung field: Secondary | ICD-10-CM | POA: Diagnosis not present

## 2017-08-20 DIAGNOSIS — E785 Hyperlipidemia, unspecified: Secondary | ICD-10-CM | POA: Diagnosis present

## 2017-08-20 DIAGNOSIS — Z888 Allergy status to other drugs, medicaments and biological substances status: Secondary | ICD-10-CM

## 2017-08-20 DIAGNOSIS — Z8673 Personal history of transient ischemic attack (TIA), and cerebral infarction without residual deficits: Secondary | ICD-10-CM

## 2017-08-20 DIAGNOSIS — E43 Unspecified severe protein-calorie malnutrition: Secondary | ICD-10-CM | POA: Diagnosis present

## 2017-08-20 DIAGNOSIS — C801 Malignant (primary) neoplasm, unspecified: Secondary | ICD-10-CM | POA: Diagnosis present

## 2017-08-20 DIAGNOSIS — M8458XA Pathological fracture in neoplastic disease, other specified site, initial encounter for fracture: Secondary | ICD-10-CM | POA: Diagnosis present

## 2017-08-20 DIAGNOSIS — K5900 Constipation, unspecified: Secondary | ICD-10-CM | POA: Diagnosis not present

## 2017-08-20 DIAGNOSIS — D630 Anemia in neoplastic disease: Secondary | ICD-10-CM | POA: Diagnosis present

## 2017-08-20 DIAGNOSIS — Z885 Allergy status to narcotic agent status: Secondary | ICD-10-CM

## 2017-08-20 DIAGNOSIS — C3412 Malignant neoplasm of upper lobe, left bronchus or lung: Secondary | ICD-10-CM | POA: Diagnosis not present

## 2017-08-20 DIAGNOSIS — Z87891 Personal history of nicotine dependence: Secondary | ICD-10-CM

## 2017-08-20 DIAGNOSIS — T40605A Adverse effect of unspecified narcotics, initial encounter: Secondary | ICD-10-CM | POA: Diagnosis not present

## 2017-08-20 DIAGNOSIS — Z86718 Personal history of other venous thrombosis and embolism: Secondary | ICD-10-CM

## 2017-08-20 LAB — COMPREHENSIVE METABOLIC PANEL
ALBUMIN: 2.4 g/dL — AB (ref 3.5–5.0)
ALK PHOS: 94 U/L (ref 38–126)
ALT: 13 U/L — ABNORMAL LOW (ref 17–63)
AST: 21 U/L (ref 15–41)
Anion gap: 12 (ref 5–15)
BILIRUBIN TOTAL: 0.2 mg/dL — AB (ref 0.3–1.2)
BUN: 11 mg/dL (ref 6–20)
CALCIUM: 9.2 mg/dL (ref 8.9–10.3)
CO2: 26 mmol/L (ref 22–32)
Chloride: 96 mmol/L — ABNORMAL LOW (ref 101–111)
Creatinine, Ser: 0.76 mg/dL (ref 0.61–1.24)
GFR calc Af Amer: >60 mL/min (ref >60)
GFR calc non Af Amer: >60 mL/min (ref >60)
GLUCOSE: 94 mg/dL (ref 65–99)
POTASSIUM: 4 mmol/L (ref 3.5–5.1)
SODIUM: 134 mmol/L — AB (ref 135–145)
TOTAL PROTEIN: 6.4 g/dL — AB (ref 6.5–8.1)

## 2017-08-20 LAB — CBC WITH DIFFERENTIAL/PLATELET
BASOS ABS: 0 10*3/uL (ref 0.0–0.1)
BASOS PCT: 1 %
Eosinophils Absolute: 0 10*3/uL (ref 0.0–0.7)
Eosinophils Relative: 1 %
HEMATOCRIT: 25.2 % — AB (ref 39.0–52.0)
Hemoglobin: 8.7 g/dL — ABNORMAL LOW (ref 13.0–17.0)
Lymphocytes Relative: 19 %
Lymphs Abs: 0.4 10*3/uL — ABNORMAL LOW (ref 0.7–4.0)
MCH: 33.7 pg (ref 26.0–34.0)
MCHC: 34.5 g/dL (ref 30.0–36.0)
MCV: 97.7 fL (ref 78.0–100.0)
Monocytes Absolute: 0.5 10*3/uL (ref 0.1–1.0)
Monocytes Relative: 24 %
NEUTROS ABS: 1.2 10*3/uL — AB (ref 1.7–7.7)
Neutrophils Relative %: 55 %
Platelets: 214 10*3/uL (ref 150–400)
RBC: 2.58 MIL/uL — ABNORMAL LOW (ref 4.22–5.81)
RDW: 13.2 % (ref 11.5–15.5)
WBC: 2.2 10*3/uL — AB (ref 4.0–10.5)

## 2017-08-20 LAB — I-STAT CG4 LACTIC ACID, ED: LACTIC ACID, VENOUS: 0.39 mmol/L — AB (ref 0.5–1.9)

## 2017-08-20 LAB — BRAIN NATRIURETIC PEPTIDE: B NATRIURETIC PEPTIDE 5: 24.9 pg/mL (ref 0.0–100.0)

## 2017-08-20 LAB — I-STAT TROPONIN, ED: Troponin i, poc: 0 ng/mL (ref 0.00–0.08)

## 2017-08-20 MED ORDER — SODIUM CHLORIDE 0.9% FLUSH
3.0000 mL | Freq: Two times a day (BID) | INTRAVENOUS | Status: DC
  Administered 2017-08-20 – 2017-09-03 (×20): 3 mL via INTRAVENOUS

## 2017-08-20 MED ORDER — SODIUM CHLORIDE 0.9 % IV SOLN
1.0000 g | Freq: Once | INTRAVENOUS | Status: AC
  Administered 2017-08-20: 1 g via INTRAVENOUS
  Filled 2017-08-20: qty 1

## 2017-08-20 MED ORDER — DIAZEPAM 5 MG PO TABS
5.0000 mg | ORAL_TABLET | Freq: Two times a day (BID) | ORAL | Status: DC | PRN
  Administered 2017-08-21 – 2017-08-28 (×8): 5 mg via ORAL
  Filled 2017-08-20 (×8): qty 1

## 2017-08-20 MED ORDER — SODIUM CHLORIDE 0.9 % IV SOLN: 250.0000 mL | INTRAVENOUS | Status: DC | PRN

## 2017-08-20 MED ORDER — IPRATROPIUM BROMIDE 0.02 % IN SOLN: 0.5000 mg | Freq: Four times a day (QID) | RESPIRATORY_TRACT | Status: DC

## 2017-08-20 MED ORDER — GUAIFENESIN ER 600 MG PO TB12
600.0000 mg | ORAL_TABLET | Freq: Two times a day (BID) | ORAL | Status: DC
  Administered 2017-08-20 – 2017-09-04 (×28): 600 mg via ORAL
  Filled 2017-08-20 (×28): qty 1

## 2017-08-20 MED ORDER — SODIUM CHLORIDE 0.9 % IV BOLUS (SEPSIS)
1000.0000 mL | Freq: Once | INTRAVENOUS | Status: AC
  Administered 2017-08-20: 1000 mL via INTRAVENOUS

## 2017-08-20 MED ORDER — POLYETHYLENE GLYCOL 3350 17 G PO PACK
17.0000 g | PACK | Freq: Every day | ORAL | Status: DC
  Administered 2017-08-21 – 2017-09-03 (×9): 17 g via ORAL
  Filled 2017-08-20 (×12): qty 1

## 2017-08-20 MED ORDER — SODIUM CHLORIDE 0.9% FLUSH: 3.0000 mL | INTRAVENOUS | Status: DC | PRN

## 2017-08-20 MED ORDER — IOPAMIDOL (ISOVUE-300) INJECTION 61%
INTRAVENOUS | Status: AC
  Filled 2017-08-20: qty 100

## 2017-08-20 MED ORDER — ALBUTEROL SULFATE (2.5 MG/3ML) 0.083% IN NEBU
2.5000 mg | INHALATION_SOLUTION | RESPIRATORY_TRACT | Status: DC | PRN
  Administered 2017-08-25 – 2017-09-02 (×3): 2.5 mg via RESPIRATORY_TRACT
  Filled 2017-08-20 (×3): qty 3

## 2017-08-20 MED ORDER — TRAZODONE HCL 50 MG PO TABS
50.0000 mg | ORAL_TABLET | Freq: Every evening | ORAL | Status: DC | PRN
  Administered 2017-08-21 – 2017-08-26 (×5): 50 mg via ORAL
  Filled 2017-08-20 (×5): qty 1

## 2017-08-20 MED ORDER — ALBUTEROL SULFATE (2.5 MG/3ML) 0.083% IN NEBU: 2.5000 mg | INHALATION_SOLUTION | Freq: Four times a day (QID) | RESPIRATORY_TRACT | Status: DC

## 2017-08-20 MED ORDER — PANTOPRAZOLE SODIUM 40 MG PO TBEC
40.0000 mg | DELAYED_RELEASE_TABLET | Freq: Every day | ORAL | Status: DC
  Administered 2017-08-21 – 2017-09-02 (×11): 40 mg via ORAL
  Filled 2017-08-20 (×12): qty 1

## 2017-08-20 MED ORDER — PRUTECT EX EMUL
1.0000 "application " | Freq: Two times a day (BID) | CUTANEOUS | Status: DC
  Administered 2017-08-21 – 2017-08-29 (×3): 1 via TOPICAL
  Filled 2017-08-20 (×3): qty 45

## 2017-08-20 MED ORDER — IPRATROPIUM-ALBUTEROL 0.5-2.5 (3) MG/3ML IN SOLN
3.0000 mL | Freq: Once | RESPIRATORY_TRACT | Status: AC
  Administered 2017-08-20: 3 mL via RESPIRATORY_TRACT
  Filled 2017-08-20: qty 3

## 2017-08-20 MED ORDER — VANCOMYCIN HCL IN DEXTROSE 1-5 GM/200ML-% IV SOLN
1000.0000 mg | Freq: Once | INTRAVENOUS | Status: AC
  Administered 2017-08-20: 1000 mg via INTRAVENOUS
  Filled 2017-08-20: qty 200

## 2017-08-20 MED ORDER — AMLODIPINE BESYLATE 10 MG PO TABS
10.0000 mg | ORAL_TABLET | Freq: Every day | ORAL | Status: DC
  Administered 2017-08-21: 10 mg via ORAL
  Filled 2017-08-20: qty 1
  Filled 2017-08-20: qty 2

## 2017-08-20 MED ORDER — ONDANSETRON HCL 8 MG PO TABS
8.0000 mg | ORAL_TABLET | Freq: Three times a day (TID) | ORAL | Status: DC | PRN
  Administered 2017-09-01 – 2017-09-02 (×2): 8 mg via ORAL
  Filled 2017-08-20: qty 2
  Filled 2017-08-20 (×2): qty 1

## 2017-08-20 MED ORDER — SODIUM CHLORIDE 0.9 % IJ SOLN
INTRAMUSCULAR | Status: AC
  Filled 2017-08-20: qty 50

## 2017-08-20 MED ORDER — IOPAMIDOL (ISOVUE-370) INJECTION 76%
INTRAVENOUS | Status: AC
  Administered 2017-08-20: 73 mL
  Filled 2017-08-20: qty 100

## 2017-08-20 MED ORDER — OXYCODONE HCL 5 MG PO TABS
5.0000 mg | ORAL_TABLET | Freq: Four times a day (QID) | ORAL | Status: DC | PRN
  Administered 2017-08-21 – 2017-08-22 (×4): 5 mg via ORAL
  Filled 2017-08-20 (×4): qty 1

## 2017-08-20 MED ORDER — METOPROLOL TARTRATE 25 MG PO TABS
100.0000 mg | ORAL_TABLET | Freq: Two times a day (BID) | ORAL | Status: DC
  Administered 2017-08-21 – 2017-08-23 (×6): 100 mg via ORAL
  Filled 2017-08-20 (×7): qty 4

## 2017-08-20 MED ORDER — MORPHINE SULFATE ER 30 MG PO TBCR
30.0000 mg | EXTENDED_RELEASE_TABLET | Freq: Three times a day (TID) | ORAL | Status: DC
  Administered 2017-08-20 – 2017-08-21 (×2): 30 mg via ORAL
  Filled 2017-08-20 (×2): qty 2

## 2017-08-20 MED ORDER — ONDANSETRON HCL 4 MG/2ML IJ SOLN
4.0000 mg | Freq: Once | INTRAMUSCULAR | Status: AC
  Administered 2017-08-20: 4 mg via INTRAVENOUS
  Filled 2017-08-20: qty 2

## 2017-08-20 MED ORDER — FENTANYL CITRATE (PF) 100 MCG/2ML IJ SOLN
50.0000 ug | Freq: Once | INTRAMUSCULAR | Status: AC
  Administered 2017-08-20: 50 ug via INTRAVENOUS
  Filled 2017-08-20: qty 2

## 2017-08-20 MED ORDER — MORPHINE SULFATE (PF) 4 MG/ML IV SOLN
6.0000 mg | Freq: Once | INTRAVENOUS | Status: AC
  Administered 2017-08-20: 6 mg via INTRAVENOUS
  Filled 2017-08-20: qty 2

## 2017-08-20 MED ORDER — VITAMIN B-1 100 MG PO TABS
100.0000 mg | ORAL_TABLET | Freq: Every day | ORAL | Status: DC
  Administered 2017-08-22 – 2017-09-04 (×8): 100 mg via ORAL
  Filled 2017-08-20 (×13): qty 1

## 2017-08-20 MED ORDER — DOCUSATE SODIUM 100 MG PO CAPS
100.0000 mg | ORAL_CAPSULE | Freq: Two times a day (BID) | ORAL | Status: DC
  Administered 2017-08-20 – 2017-08-25 (×11): 100 mg via ORAL
  Filled 2017-08-20 (×14): qty 1

## 2017-08-20 NOTE — Progress Notes (Signed)
A consult was received from an ED physician for vancomycin per pharmacy dosing.  The patient's profile has been reviewed for ht/wt/allergies/indication/available labs.   A one time order has been placed for vancomycin 1g.  Further antibiotics/pharmacy consults should be ordered by admitting physician if indicated.                       Thank you, Hershal Coria 08/20/2017  8:37 PM

## 2017-08-20 NOTE — ED Triage Notes (Signed)
Pt to ed via [facility], with c/o of Shortness of Breath and pain 10/10  Everywhere. Pt is actively on chemo for bone and lung cancer. Per EMS when arrived at home to pick up Patient spO2 was 85% and went up to 95% when put on 3L. Vitals 120/70 80 HR

## 2017-08-20 NOTE — ED Provider Notes (Signed)
[facility] DEPT Provider Note   CSN: [id number] Arrival date & time: 08/20/17  1813     History   Chief Complaint Chief Complaint  Patient presents with  . Shortness of Breath    HPI Tanner [name] is a 62 y.o. male.  HPI Tanner [name] is a 62 y.o. 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  Patient states that he noticed shortness of breath this morning when he woke up.  He states he is having chest pain but is worse with sitting upright.  It is better when laying down.  He states that he is feeling short of breath, and is not worsened with any activity or position change.  He denies any cough.  No URI symptoms.  He denies any nausea or vomiting.  No diarrhea, complaining of constipation.  He is also complaining of bilateral shoulder and scapular pain which has been there for several weeks, and currently treated with MS Contin, 30 mg every 8 hours.  He states that he finished radiation, and currently going palliative chemotherapy, had first treatment 3 weeks ago.  He denies prior lung or heart problems.  According to EMS, patient had oxygen saturation of 85 on room air.  He was placed on oxygen and transported here.  Past Medical History:  Diagnosis Date  . Alcohol abuse   . Alcoholic hepatitis   . Allergy   . Anxiety   . 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 C. difficile colitis   . Depression   . DVT (deep venous thrombosis) ([place]) 2015  . GERD (gastroesophageal reflux disease)   . Hypertension   . Ileus ([place]) 09/2013  . 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 Shortness of breath    new onset- occasionally-at rest and exertion  . Substance abuse ([place])   . TIA (transient ischemic attack) 08/2013    Patient  Active Problem List   Diagnosis Date Noted  . Constipation 08/14/2017  . 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 Goals of care, counseling/discussion 07/16/2017  . Chronic low back pain 07/04/2017  . Bone metastasis ([place]) 06/23/2017  . COPD with acute exacerbation ([place]) 05/15/2017  . Multiple lung nodules on CT   . Near syncope 05/03/2017  . Lung mass 05/03/2017  . Hyperlipidemia 09/20/2016  . Protein-calorie malnutrition ([place]) 08/17/2016  . Thoracic compression fracture, closed, initial encounter ([place]) 08/17/2016  . Alcohol dependence ([place]) 08/17/2016  . Tobacco dependence 08/17/2016  . Osteoporosis 08/17/2016  . Hip fracture ([place]) 08/16/2016  . Headache 02/24/2016  . Hypertensive urgency 02/03/2016  . Shortness of breath 07/28/2015  . Generalized anxiety disorder 07/28/2015  . Medicare annual wellness visit, subsequent 05/24/2015  . Acute DVT of right tibial vein ([place]) 02/27/2015  . Tibia fracture 02/20/2015  . Syncope 02/18/2015  . Essential hypertension 02/18/2015  . History of stroke 02/18/2015  . Fracture of tibia, right, closed   . C. difficile colitis 09/11/2013  . UTI (urinary tract infection) 09/11/2013  . Essential hypertension, benign 09/11/2013  . Ileus ([place]) 09/10/2013  . Spastic hemiplegia affecting dominant side ([place]) 09/09/2013  . Ileus, postoperative ([place]) 09/09/2013  . Hypokalemia 09/09/2013  . Nonspecific (abnormal) findings on radiological and other examination of gastrointestinal tract 09/09/2013  . CVA (cerebral  infarction) 09/05/2013  . 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 Malignant neoplasm of prostate ([place]) 08/29/2013  . Hyponatremia 12/11/2012    Class: Chronic  . Ankle fracture 12/10/2012    Past Surgical History:  Procedure Laterality Date  . HAND SURGERY Right   . HERNIA REPAIR    . INTRAMEDULLARY (IM) NAIL INTERTROCHANTERIC Right 08/17/2016   Procedure: INTRAMEDULLARY (IM) NAIL RIGHT HIP;  Surgeon: Tania Ade,  MD;  Location: [facility];  Service: Orthopedics;  Laterality: Right;  . LYMPHADENECTOMY Bilateral 08/29/2013   Procedure: LYMPHADENECTOMY "BILATERAL PELVIC LYMPH NODE DISSECTION";  Surgeon: Molli Hazard, MD;  Location: [facility];  Service: Urology;  Laterality: Bilateral;  . ORIF ANKLE FRACTURE Right 12/10/2012   Procedure: OPEN REDUCTION INTERNAL FIXATION (ORIF) ANKLE FRACTURE;  Surgeon: Hessie Dibble, MD;  Location: [facility];  Service: Orthopedics;  Laterality: Right;  . ROBOT ASSISTED LAPAROSCOPIC RADICAL PROSTATECTOMY N/A 08/29/2013   Procedure: ROBOTIC ASSISTED LAPAROSCOPIC RADICAL PROSTATECTOMY LEVEL 2, Lysis of adhesions;  Surgeon: Molli Hazard, MD;  Location: [facility];  Service: Urology;  Laterality: N/A;       Home Medications    Prior to Admission medications   Medication Sig Start Date End Date Taking? Authorizing Provider  amLODipine (NORVASC) 10 MG tablet Take 1 tablet (10 mg total) daily by mouth. 05/17/17   [name], NP  Aspirin-Acetaminophen (GOODY BODY PAIN) 500-325 MG PACK Take by mouth.    [provider]  bifidobacterium infantis (ALIGN) capsule Take 1 capsule daily by mouth.    [provider]  diazepam (VALIUM) 5 MG tablet Take 1 tablet (5 mg total) by mouth 2 (two) times daily as needed for anxiety or muscle spasms. 08/03/17 08/03/18  Marrian Salvage, [name]  docusate sodium (COLACE) 100 MG capsule Take 1 capsule (100 mg total) by mouth 2 (two) times daily. 08/14/17   Maryanna Shape, NP  emollient (BIAFINE) cream Apply 1 application topically 2 (two) times daily. Apply to skin areas after rad tx and bedtime daily,nothing 4 hours prior to rad txs 06/11/17   Hayden Pedro, PA-C  FLUoxetine (PROZAC) 40 MG capsule TAKE 1 CAPSULE BY MOUTH EVERY DAY 05/21/17   [name], NP  lisinopril (PRINIVIL,ZESTRIL) 40 MG tablet TAKE ONE (1) TABLET BY MOUTH EVERY DAY 05/21/17   [name], NP  loperamide (IMODIUM) 2 MG  capsule Take 2 capsules (4 mg total) by mouth every 8 (eight) hours as needed for diarrhea or loose stools. Over the counter Patient not taking: Reported on 08/14/2017 05/05/17   Rai, Vernelle Emerald, MD  magnesium hydroxide (PHILLIPS MILK OF MAGNESIA) 400 MG/5ML suspension Take by mouth.    [provider]  metoprolol tartrate (LOPRESSOR) 100 MG tablet Take 1 tablet (100 mg total) by mouth 2 (two) times daily. 05/11/17   [name], NP  morphine (MS CONTIN) 30 MG 12 hr tablet Take 1 tablet (30 mg total) by mouth every 8 (eight) hours. 08/14/17   Maryanna Shape, NP  omeprazole (PRILOSEC) 40 MG capsule TAKE 1 CAPSULE BY MOUTH TWICE A DAY 08/19/17   Shambley, Delphia Grates, NP  ondansetron (ZOFRAN) 8 MG tablet Take 1 tablet (8 mg total) by mouth every 8 (eight) hours as needed for nausea or vomiting. 07/04/17   Hayden Pedro, PA-C  oxyCODONE (OXY IR/ROXICODONE) 5 MG immediate release tablet Take 1 tablet (5 mg total) by mouth every 6 (six) hours as needed for severe pain. 07/16/17   Causey,  Charlestine Massed, NP  simvastatin (ZOCOR) 20 MG tablet TAKE ONE (1) TABLET BY MOUTH EVERY DAY 03/14/17   [name], [name]  thiamine 100 MG tablet Take 1 tablet (100 mg total) by mouth daily. 05/05/17   Rai, Ripudeep Raliegh Ip, MD  traZODone (DESYREL) 50 MG tablet Take 1 tablet (50 mg total) by mouth at bedtime as needed. for sleep 05/11/17   [name], NP    Family History Family History  Problem Relation Age of Onset  . Lung cancer Father        smoker  . COPD Father   . Emphysema Father   . Arthritis Mother   . Pancreatic cancer Sister     Social History Social History   Tobacco Use  . Smoking status: Former Smoker    Packs/day: 1.50    Years: 30.00    Pack years: 45.00    Types: Cigarettes    Last attempt to quit: 03/09/2017    Years since quitting: 0.4  . Smokeless tobacco: Never Used  Substance Use Topics  . Alcohol use: Yes    Alcohol/week: 3.0 - 6.0 oz    Types: 5 -  10 Cans of beer per week  . Drug use: Yes    Types: Marijuana     Allergies   Dilaudid [hydromorphone hcl]; Procaine hcl; and Sulfa antibiotics   Review of Systems Review of Systems  Constitutional: Negative for chills and fever.  Respiratory: Positive for cough, chest tightness and shortness of breath.   Cardiovascular: Positive for chest pain. Negative for palpitations and leg swelling.  Gastrointestinal: Negative for abdominal distention, abdominal pain, diarrhea, nausea and vomiting.  Genitourinary: Negative for dysuria, frequency, hematuria and urgency.  Musculoskeletal: Negative for arthralgias, myalgias, neck pain and neck stiffness.  Skin: Negative for rash.  Allergic/Immunologic: Negative for immunocompromised state.  Neurological: Negative for dizziness, weakness, light-headedness, numbness and headaches.  All other systems reviewed and are negative.    Physical Exam Updated Vital Signs BP 90/73 (BP Location: Right Arm)   Pulse 80   Temp 98.2 F (36.8 C) (Oral)   Resp (!) 22   Ht 5\' 10"  (1.778 m)   Wt 68 kg (150 lb)   SpO2 94%   BMI 21.52 kg/m   Physical Exam  Constitutional: He appears well-developed and well-nourished. No distress.  HENT:  Head: Normocephalic and atraumatic.  Eyes: Conjunctivae are normal.  Neck: Neck supple.  Cardiovascular: Normal rate, regular rhythm and normal heart sounds.  Pulmonary/Chest: Effort normal. No respiratory distress. He has no rales.  Decreased air movement bilaterally  Abdominal: Soft. Bowel sounds are normal. He exhibits no distension. There is no tenderness. There is no rebound.  Musculoskeletal: He exhibits no edema.  Neurological: He is alert.  Skin: Skin is warm and dry.  Nursing note and vitals reviewed.    ED Treatments / Results  Labs (all labs ordered are listed, but only abnormal results are displayed) Labs Reviewed  CBC WITH DIFFERENTIAL/PLATELET - Abnormal; Notable for the following components:       Result Value   WBC 2.2 (*)    RBC 2.58 (*)    Hemoglobin 8.7 (*)    HCT 25.2 (*)    Neutro Abs 1.2 (*)    Lymphs Abs 0.4 (*)    All other components within normal limits  COMPREHENSIVE METABOLIC PANEL - Abnormal; Notable for the following components:   Sodium 134 (*)    Chloride 96 (*)    Total Protein 6.4 (*)  Albumin 2.4 (*)    ALT 13 (*)    Total Bilirubin 0.2 (*)    All other components within normal limits  BRAIN NATRIURETIC PEPTIDE  I-STAT TROPONIN, ED  I-STAT CG4 LACTIC ACID, ED    EKG  EKG Interpretation  Date/Time:  Monday August 20 2017 18:50:22 EST Ventricular Rate:  79 PR Interval:    QRS Duration: 93 QT Interval:  363 QTC Calculation: 417 R Axis:   42 Text Interpretation:  Sinus rhythm Nonspecific T abnormalities, anterior leads No significant change since last tracing Confirmed by Duffy Bruce [id number]) on 08/20/2017 8:15:29 PM       Radiology Dg Chest 2 View  Result Date: 08/20/2017 CLINICAL DATA:  Dyspnea EXAM: CHEST  2 VIEW COMPARISON:  05/02/2017 chest radiograph. FINDINGS: Stable cardiomediastinal silhouette with normal heart size. No pneumothorax. No pleural effusion. New patchy left lower lobe lung opacity. No pulmonary edema. Stable chronic mid thoracic vertebral compression fracture with associated kyphotic angulation. IMPRESSION: New patchy left lower lobe lung opacity, which may represent a pneumonia in the correct clinical setting. Recommend attention on short-term follow-up post treatment chest imaging. Electronically Signed   By: Ilona Sorrel M.D.   On: 08/20/2017 19:42   Ct Angio Chest Pe W And/or Wo Contrast  Result Date: 08/20/2017 CLINICAL DATA:  Dyspnea and diffuse pain. Patient is on chemotherapy for bone and lung cancer. EXAM: CT ANGIOGRAPHY CHEST WITH CONTRAST TECHNIQUE: Multidetector CT imaging of the chest was performed using the standard protocol during bolus administration of intravenous contrast. Multiplanar CT image  reconstructions and MIPs were obtained to evaluate the vascular anatomy. CONTRAST:  28mL ISOVUE-370 IOPAMIDOL (ISOVUE-370) INJECTION 76% COMPARISON:  PET-CT 05/11/2017, chest CT 05/03/2017. FINDINGS: Cardiovascular: Heart is normal in size. No pericardial effusion is identified. There is coronary arteriosclerosis along the left main and LAD. No acute pulmonary embolus. Satisfactory opacification of the pulmonary arteries. There is minimal aortic atherosclerosis. No aneurysm or dissection. Mediastinum/Nodes: Interval increase in prevascular lymph node measuring 2.8 x 1.7 cm, series 4, image 27 versus 1.1 x 0.5 cm on prior chest CT. Trachea and mainstem bronchi appear patent. The thoracic esophagus is unremarkable. No thyromegaly or mass. No supraclavicular, axillary nor hilar lymphadenopathy. Lungs/Pleura: Interval increase in size of previously noted medial left upper lobe mass abutting the superior mediastinum, currently estimated at 3.2 x 2.2 x 2.1 cm previously estimated at 2.2 x 2.1 x 1.4 cm. No additional pulmonary masses. There is atelectasis along the posterior aspect of both lower lobes. Upper Abdomen: Inhomogeneous appearance of the included right hepatic lobe concerning for metastasis. Included left adrenal gland is unremarkable. The right adrenal gland is excluded. Musculoskeletal: The following osteolytic metastatic foci and/or fractures are noted: 1. Left glenoid neck. 2. Right lateral third and sixth rib fractures likely pathologic. Left lateral sixth rib fracture. 3. Bony expansile 3.7 x 2.8 cm right lateral fifth rib mass, bony expansile lytic 3.3 x 2.5 cm right posterolateral seventh rib and 1.5 x 2.4 cm right lateral eighth rib lytic masses. 4. 6.8 x 3.4 cm bony expansile lytic mass of the posterior left eighth rib likely accounting for the opacity seen radiographically, expansile lytic 3.8 x 2 cm left posterolateral ninth rib mass. 5. Redemonstration of lower thoracic kyphosis with multilevel  lytic lesions of the thoracic spine namely T2 through T5, T7, T9 and T10. Review of the MIP images confirms the above findings. IMPRESSION: 1. The left lower lobe opacity seen on same day chest radiograph is not attributable to a  pneumonia but due to a bony expansile lytic lesion involving the posterior left eighth rib measuring 6.8 x 3.4 cm. Additional bony expansile lytic metastasis involving numerous bilateral ribs as above described with osteolytic lesions of the thoracic spine are in keeping with metastasis. Additional osteolytic abnormalities involving the left glenoid neck with pathologic bilateral rib fractures. 2. Ill-defined partially included hypodensities in the right hepatic lobe are concerning for hepatic metastasis, also new since prior. 3. Interval increase in left upper lobe paramediastinal mass now measuring 3.2 x 2.2 x 2.1 cm versus 2.2 x 2.1 x 1.4 cm previously. Interval increase size of a prevascular lymph node the abutting the aortic arch now measuring 2.8 x 1.7 cm versus 1.1 x 0.5 cm on prior chest CT. 4. No acute pulmonary embolus. 5. Coronary arteriosclerosis. Electronically Signed   By: Ashley Royalty M.D.   On: 08/20/2017 22:07    Procedures Procedures (including critical care time)  Medications Ordered in ED Medications  ipratropium-albuterol (DUONEB) 0.5-2.5 (3) MG/3ML nebulizer solution 3 mL (not administered)     Initial Impression / Assessment and Plan / ED Course  I have reviewed the triage vital signs and the nursing notes.  Pertinent labs & imaging results that were available during my care of the patient were reviewed by me and considered in my medical decision making (see chart for details).     Patient in emergency department with acute onset of shortness of breath that started this morning.  Patient had chemotherapy 3 weeks ago.  Hypoxic brain mass, here with oxygen in the low to mid 90s on 1 L.  Denies any chest pain unless he is sitting upright.  Will check  labs, chest x-ray.   Chest x-ray showing possible pneumonia, will start antibiotics.  WBCs 2.2, hemoglobin 8.7, which is gram and a half lower than his baseline.  Question whether this is possibly due to his chemotherapy.  Will get CT angios to rule out PE.  Antibiotics for possible pneumonia ordered.  10:40 PM CT angios negative.  I spoke with admitting doctor, will bring him in for hypoxia.  I confirmed with patient and his family, he is DNR.  Vitals:   08/20/17 1824 08/20/17 1835 08/20/17 2044  BP: 90/73  102/71  Pulse: 80  81  Resp: (!) 22  16  Temp: 98.2 F (36.8 C)    TempSrc: Oral    SpO2: 95% 94% 99%  Weight: 68 kg (150 lb)    Height: 5\' 10"  (1.778 m)       Final Clinical Impressions(s) / ED Diagnoses   Final diagnoses:  Hypoxia  Metastatic cancer [facility])    ED Discharge Orders    None       Jeannett Senior, PA-C 08/20/17 2241    Duffy Bruce, MD 08/21/17 0200

## 2017-08-20 NOTE — H&P (Signed)
History and Physical    Tanner [name] XBM:[id number] DOB: 1955-09-04 DOA: 08/20/2017  PCP: [name], NP  Patient coming from: Home  Chief Complaint: Shortness of breath  HPI: Tanner [name] is a 62 y.o. 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  Family reports that he has pain is been difficult to control at home he is got multiple bony lytic lesions with rib fractures.  They just adjusted his pain medications last week.  He has been short of breath at home and coughing.  He is got extensive disease and is along with multiple bony lytic lesion with pathological fractures of the ribs.  He denies having any pain with breathing.  He just says he hurts all over.  When he EMS was called his O2 sats were in the low 80s on room air.  He does not have supplemental oxygen at home.  He just was set up with palliative care.  Him and the family are not interested in speaking about hospice.  Patient CTA shows no evidence for pneumonia or PE.  Patient is referred for admission for hypoxemia.  Review of Systems: As per HPI otherwise 10 point review of systems negative.   Past Medical History:  Diagnosis Date  . Alcohol abuse   . Alcoholic hepatitis   . Allergy   . Anxiety   . 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 C. difficile colitis   . Depression   . DVT (deep venous thrombosis) ([place]) 2015  . GERD (gastroesophageal reflux disease)   . Hypertension   . Ileus ([place]) 09/2013  . 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 Shortness of breath    new onset- occasionally-at rest and exertion  . Substance abuse ([place])   . TIA (transient ischemic attack) 08/2013    Past Surgical History:  Procedure Laterality Date  . HAND SURGERY Right   . HERNIA REPAIR    . INTRAMEDULLARY (IM) NAIL INTERTROCHANTERIC Right 08/17/2016   Procedure: INTRAMEDULLARY (IM) NAIL  RIGHT HIP;  Surgeon: Tania Ade, MD;  Location: [facility];  Service: Orthopedics;  Laterality: Right;  . LYMPHADENECTOMY Bilateral 08/29/2013   Procedure: LYMPHADENECTOMY "BILATERAL PELVIC LYMPH NODE DISSECTION";  Surgeon: Molli Hazard, MD;  Location: [facility];  Service: Urology;  Laterality: Bilateral;  . ORIF ANKLE FRACTURE Right 12/10/2012   Procedure: OPEN REDUCTION INTERNAL FIXATION (ORIF) ANKLE FRACTURE;  Surgeon: Hessie Dibble, MD;  Location: [facility];  Service: Orthopedics;  Laterality: Right;  . ROBOT ASSISTED LAPAROSCOPIC RADICAL PROSTATECTOMY N/A 08/29/2013   Procedure: ROBOTIC ASSISTED LAPAROSCOPIC RADICAL PROSTATECTOMY LEVEL 2, Lysis of adhesions;  Surgeon: Molli Hazard, MD;  Location: [facility];  Service: Urology;  Laterality: N/A;     reports that he quit smoking about 5 months ago. His smoking use included cigarettes. He has a 45.00 pack-year smoking history. he has never used smokeless tobacco. He reports that he drinks about 3.0 - 6.0 oz of alcohol per week. He reports that he uses drugs. Drug: Marijuana.  Allergies  Allergen Reactions  . Dilaudid [Hydromorphone Hcl] Nausea And Vomiting    Pre pt history  . Procaine Hcl Other (See Comments)    08/10/2017: The patient indicated that Novocaine was used by a dermatologist prior to obtaining biopsy on his back "many years ago". Patient experienced a syncopal reaction by his report. The  patient has since had dental anesthetic without any adverse reactions.   . Sulfa Antibiotics Rash    Family History  Problem Relation Age of Onset  . Lung cancer Father        smoker  . COPD Father   . Emphysema Father   . Arthritis Mother   . Pancreatic cancer Sister     Prior to Admission medications   Medication Sig Start Date End Date Taking? Authorizing Provider  amLODipine (NORVASC) 10 MG tablet Take 1 tablet (10 mg total) daily by mouth. 05/17/17  Yes [name], NP  Aspirin-Acetaminophen (GOODY BODY PAIN)  500-325 MG PACK Take by mouth.   Yes [provider]  diazepam (VALIUM) 5 MG tablet Take 1 tablet (5 mg total) by mouth 2 (two) times daily as needed for anxiety or muscle spasms. 08/03/17 08/03/18 Yes Marrian Salvage, [name]  docusate sodium (COLACE) 100 MG capsule Take 1 capsule (100 mg total) by mouth 2 (two) times daily. 08/14/17  Yes Curcio, Roselie Awkward, NP  emollient (BIAFINE) cream Apply 1 application topically 2 (two) times daily. Apply to skin areas after rad tx and bedtime daily,nothing 4 hours prior to rad txs 06/11/17  Yes Hayden Pedro, PA-C  FLUoxetine (PROZAC) 40 MG capsule TAKE 1 CAPSULE BY MOUTH EVERY DAY 05/21/17  Yes Shambley, Delphia Grates, NP  lisinopril (PRINIVIL,ZESTRIL) 40 MG tablet TAKE ONE (1) TABLET BY MOUTH EVERY DAY 05/21/17  Yes [name], NP  metoprolol tartrate (LOPRESSOR) 100 MG tablet Take 1 tablet (100 mg total) by mouth 2 (two) times daily. 05/11/17  Yes Shambley, Delphia Grates, NP  MIRALAX powder Take 238 g by mouth daily. 08/14/17  Yes [provider]  morphine (MS CONTIN) 30 MG 12 hr tablet Take 1 tablet (30 mg total) by mouth every 8 (eight) hours. 08/14/17  Yes Curcio, Roselie Awkward, NP  omeprazole (PRILOSEC) 40 MG capsule TAKE 1 CAPSULE BY MOUTH TWICE A DAY 08/19/17  Yes Shambley, Ashleigh N, NP  ondansetron (ZOFRAN) 8 MG tablet Take 1 tablet (8 mg total) by mouth every 8 (eight) hours as needed for nausea or vomiting. 07/04/17  Yes Hayden Pedro, PA-C  oxyCODONE (OXY IR/ROXICODONE) 5 MG immediate release tablet Take 1 tablet (5 mg total) by mouth every 6 (six) hours as needed for severe pain. 07/16/17  Yes Causey, Charlestine Massed, NP  simvastatin (ZOCOR) 20 MG tablet TAKE ONE (1) TABLET BY MOUTH EVERY DAY 03/14/17  Yes [name], [name]  thiamine 100 MG tablet Take 1 tablet (100 mg total) by mouth daily. 05/05/17  Yes Rai, Ripudeep K, MD  traZODone (DESYREL) 50 MG tablet Take 1 tablet (50 mg total) by mouth at bedtime as needed.  for sleep 05/11/17  Yes [name], NP  loperamide (IMODIUM) 2 MG capsule Take 2 capsules (4 mg total) by mouth every 8 (eight) hours as needed for diarrhea or loose stools. Over the counter Patient not taking: Reported on 08/14/2017 05/05/17   Mendel Corning, MD    Physical Exam: Vitals:   08/20/17 1824 08/20/17 1835 08/20/17 2044  BP: 90/73  102/71  Pulse: 80  81  Resp: (!) 22  16  Temp: 98.2 F (36.8 C)    TempSrc: Oral    SpO2: 95% 94% 99%  Weight: 68 kg (150 lb)    Height: 5\' 10"  (1.778 m)        Constitutional: NAD, calm, comfortable Vitals:   08/20/17 1824 08/20/17 1835 08/20/17 2044  BP: 90/73  102/71  Pulse: 80  81  Resp: (!) 22  16  Temp: 98.2 F (36.8 C)    TempSrc: Oral    SpO2: 95% 94% 99%  Weight: 68 kg (150 lb)    Height: 5\' 10"  (1.778 m)     Eyes: PERRL, lids and conjunctivae normal ENMT: Mucous membranes are moist. Posterior pharynx clear of any exudate or lesions.Normal dentition.  Neck: normal, supple, no masses, no thyromegaly Respiratory: clear to auscultation bilaterally, no wheezing, no crackles. Normal respiratory effort. No accessory muscle use.  Cardiovascular: Regular rate and rhythm, no murmurs / rubs / gallops. No extremity edema. 2+ pedal pulses. No carotid bruits.  Abdomen: no tenderness, no masses palpated. No hepatosplenomegaly. Bowel sounds positive.  Musculoskeletal: no clubbing / cyanosis. No joint deformity upper and lower extremities. Good ROM, no contractures. Normal muscle tone.  Skin: no rashes, lesions, ulcers. No induration Neurologic: CN 2-12 grossly intact. Sensation intact, DTR normal. Strength 5/5 in all 4.  Psychiatric: Normal judgment and insight. Alert and oriented x 3. Normal mood.    Labs on Admission: I have personally reviewed following labs and imaging studies  CBC: Recent Labs  Lab 08/14/17 1035 08/20/17 1851  WBC 1.4* 2.2*  NEUTROABS 1.0* 1.2*  HGB  --  8.7*  HCT 29.8* 25.2*  MCV 98.1* 97.7  PLT  101* 354   Basic Metabolic Panel: Recent Labs  Lab 08/14/17 1035 08/20/17 1851  NA 133* 134*  K 4.0 4.0  CL 95* 96*  CO2 26 26  GLUCOSE 114 94  BUN 8 11  CREATININE 0.76 0.76  CALCIUM 10.0 9.2   GFR: Estimated Creatinine Clearance: 95.6 mL/min (by C-G formula based on SCr of 0.76 mg/dL). Liver Function Tests: Recent Labs  Lab 08/14/17 1035 08/20/17 1851  AST 21 21  ALT 10 13*  ALKPHOS 99 94  BILITOT 0.3 0.2*  PROT 7.0 6.4*  ALBUMIN 2.3* 2.4*   No results for input(s): LIPASE, AMYLASE in the last 168 hours. No results for input(s): AMMONIA in the last 168 hours. Coagulation Profile: No results for input(s): INR, PROTIME in the last 168 hours. Cardiac Enzymes: No results for input(s): CKTOTAL, CKMB, CKMBINDEX, TROPONINI in the last 168 hours. BNP (last 3 results) No results for input(s): PROBNP in the last 8760 hours. HbA1C: No results for input(s): HGBA1C in the last 72 hours. CBG: No results for input(s): GLUCAP in the last 168 hours. Lipid Profile: No results for input(s): CHOL, HDL, LDLCALC, TRIG, CHOLHDL, LDLDIRECT in the last 72 hours. Thyroid Function Tests: No results for input(s): TSH, T4TOTAL, FREET4, T3FREE, THYROIDAB in the last 72 hours. Anemia Panel: No results for input(s): VITAMINB12, FOLATE, FERRITIN, TIBC, IRON, RETICCTPCT in the last 72 hours. Urine analysis:    Component Value Date/Time   COLORURINE STRAW (A) 05/02/2017 2317   APPEARANCEUR CLEAR 05/02/2017 2317   LABSPEC 1.010 05/02/2017 2317   PHURINE 6.0 05/02/2017 2317   GLUCOSEU NEGATIVE 05/02/2017 2317   HGBUR NEGATIVE 05/02/2017 2317   BILIRUBINUR NEGATIVE 05/02/2017 2317   KETONESUR NEGATIVE 05/02/2017 2317   PROTEINUR NEGATIVE 05/02/2017 2317   UROBILINOGEN 0.2 09/26/2013 1457   NITRITE NEGATIVE 05/02/2017 2317   LEUKOCYTESUR NEGATIVE 05/02/2017 2317   Sepsis Labs: !!!!!!!!!!!!!!!!!!!!!!!!!!!!!!!!!!!!!!!!!!!! @LABRCNTIP (procalcitonin:4,lacticidven:4) )No results found for  this or any previous visit (from the past 240 hour(s)).   Radiological Exams on Admission: Dg Chest 2 View  Result Date: 08/20/2017 CLINICAL DATA:  Dyspnea EXAM: CHEST  2 VIEW COMPARISON:  05/02/2017 chest radiograph.  FINDINGS: Stable cardiomediastinal silhouette with normal heart size. No pneumothorax. No pleural effusion. New patchy left lower lobe lung opacity. No pulmonary edema. Stable chronic mid thoracic vertebral compression fracture with associated kyphotic angulation. IMPRESSION: New patchy left lower lobe lung opacity, which may represent a pneumonia in the correct clinical setting. Recommend attention on short-term follow-up post treatment chest imaging. Electronically Signed   By: Ilona Sorrel M.D.   On: 08/20/2017 19:42   Ct Angio Chest Pe W And/or Wo Contrast  Result Date: 08/20/2017 CLINICAL DATA:  Dyspnea and diffuse pain. Patient is on chemotherapy for bone and lung cancer. EXAM: CT ANGIOGRAPHY CHEST WITH CONTRAST TECHNIQUE: Multidetector CT imaging of the chest was performed using the standard protocol during bolus administration of intravenous contrast. Multiplanar CT image reconstructions and MIPs were obtained to evaluate the vascular anatomy. CONTRAST:  32mL ISOVUE-370 IOPAMIDOL (ISOVUE-370) INJECTION 76% COMPARISON:  PET-CT 05/11/2017, chest CT 05/03/2017. FINDINGS: Cardiovascular: Heart is normal in size. No pericardial effusion is identified. There is coronary arteriosclerosis along the left main and LAD. No acute pulmonary embolus. Satisfactory opacification of the pulmonary arteries. There is minimal aortic atherosclerosis. No aneurysm or dissection. Mediastinum/Nodes: Interval increase in prevascular lymph node measuring 2.8 x 1.7 cm, series 4, image 27 versus 1.1 x 0.5 cm on prior chest CT. Trachea and mainstem bronchi appear patent. The thoracic esophagus is unremarkable. No thyromegaly or mass. No supraclavicular, axillary nor hilar lymphadenopathy. Lungs/Pleura: Interval  increase in size of previously noted medial left upper lobe mass abutting the superior mediastinum, currently estimated at 3.2 x 2.2 x 2.1 cm previously estimated at 2.2 x 2.1 x 1.4 cm. No additional pulmonary masses. There is atelectasis along the posterior aspect of both lower lobes. Upper Abdomen: Inhomogeneous appearance of the included right hepatic lobe concerning for metastasis. Included left adrenal gland is unremarkable. The right adrenal gland is excluded. Musculoskeletal: The following osteolytic metastatic foci and/or fractures are noted: 1. Left glenoid neck. 2. Right lateral third and sixth rib fractures likely pathologic. Left lateral sixth rib fracture. 3. Bony expansile 3.7 x 2.8 cm right lateral fifth rib mass, bony expansile lytic 3.3 x 2.5 cm right posterolateral seventh rib and 1.5 x 2.4 cm right lateral eighth rib lytic masses. 4. 6.8 x 3.4 cm bony expansile lytic mass of the posterior left eighth rib likely accounting for the opacity seen radiographically, expansile lytic 3.8 x 2 cm left posterolateral ninth rib mass. 5. Redemonstration of lower thoracic kyphosis with multilevel lytic lesions of the thoracic spine namely T2 through T5, T7, T9 and T10. Review of the MIP images confirms the above findings. IMPRESSION: 1. The left lower lobe opacity seen on same day chest radiograph is not attributable to a pneumonia but due to a bony expansile lytic lesion involving the posterior left eighth rib measuring 6.8 x 3.4 cm. Additional bony expansile lytic metastasis involving numerous bilateral ribs as above described with osteolytic lesions of the thoracic spine are in keeping with metastasis. Additional osteolytic abnormalities involving the left glenoid neck with pathologic bilateral rib fractures. 2. Ill-defined partially included hypodensities in the right hepatic lobe are concerning for hepatic metastasis, also new since prior. 3. Interval increase in left upper lobe paramediastinal mass now  measuring 3.2 x 2.2 x 2.1 cm versus 2.2 x 2.1 x 1.4 cm previously. Interval increase size of a prevascular lymph node the abutting the aortic arch now measuring 2.8 x 1.7 cm versus 1.1 x 0.5 cm on prior chest CT. 4. No acute pulmonary  embolus. 5. Coronary arteriosclerosis. Electronically Signed   By: Ashley Royalty M.D.   On: 08/20/2017 22:07    Old chart reviewed Case discussed with EDP  Assessment/Plan 62 year old male with acute hypoxic respiratory failure likely secondary to extensive malignancy Principal Problem:   Acute respiratory failure with hypoxia (HCC)-CTA does not show pneumonia are PE.  He is not wheezing on exam.  He is diminished however.  Tanner place on frequent bronchodilators overnight.  We Tanner not placed on steroids or antibiotics at this time in the absence of fever or infiltrate on CTA.  He Tanner likely need home oxygen set up.  Active Problems:   COPD (chronic obstructive pulmonary disease) (HCC)-frequent nebs lung exam is diminished but not wheezing   Essential hypertension, benign-continue home meds   Generalized anxiety disorder-noted   Bone metastasis (HCC)-continue home pain medication regimen family reports this has helped with his recent increase in the past week   Malignant tumor, spindle cell type (HCC)-noted    DVT prophylaxis: SCDs Code Status: Full Family Communication: Daughter and friend he lives with Disposition Plan: Per day team Consults called: None Admission status: Observation   Harika Laidlaw A MD [facility]  If 7PM-7AM, please contact night-coverage [email or web address] Password [facility]  08/20/2017, 11:17 PM

## 2017-08-20 NOTE — ED Notes (Signed)
Pt wanted to wait for fluids/meds to be ordered before he got and IV

## 2017-08-20 NOTE — ED Notes (Signed)
Bed: WA02 Expected date:  Expected time:  Means of arrival:  Comments: ems 

## 2017-08-21 ENCOUNTER — Other Ambulatory Visit: Payer: Self-pay

## 2017-08-21 ENCOUNTER — Ambulatory Visit: Admission: RE | Admit: 2017-08-21 | Payer: Medicare HMO | Source: Ambulatory Visit | Admitting: Radiation Oncology

## 2017-08-21 ENCOUNTER — Encounter (HOSPITAL_COMMUNITY): Payer: Self-pay | Admitting: *Deleted

## 2017-08-21 DIAGNOSIS — Z9221 Personal history of antineoplastic chemotherapy: Secondary | ICD-10-CM | POA: Diagnosis not present

## 2017-08-21 DIAGNOSIS — Z923 Personal history of irradiation: Secondary | ICD-10-CM | POA: Diagnosis not present

## 2017-08-21 DIAGNOSIS — I10 Essential (primary) hypertension: Secondary | ICD-10-CM | POA: Diagnosis present

## 2017-08-21 DIAGNOSIS — E873 Alkalosis: Secondary | ICD-10-CM | POA: Diagnosis not present

## 2017-08-21 DIAGNOSIS — Z7189 Other specified counseling: Secondary | ICD-10-CM | POA: Diagnosis not present

## 2017-08-21 DIAGNOSIS — R079 Chest pain, unspecified: Secondary | ICD-10-CM | POA: Diagnosis not present

## 2017-08-21 DIAGNOSIS — Z66 Do not resuscitate: Secondary | ICD-10-CM | POA: Diagnosis present

## 2017-08-21 DIAGNOSIS — E785 Hyperlipidemia, unspecified: Secondary | ICD-10-CM | POA: Diagnosis present

## 2017-08-21 DIAGNOSIS — S79912A Unspecified injury of left hip, initial encounter: Secondary | ICD-10-CM | POA: Diagnosis not present

## 2017-08-21 DIAGNOSIS — Z6822 Body mass index (BMI) 22.0-22.9, adult: Secondary | ICD-10-CM | POA: Diagnosis not present

## 2017-08-21 DIAGNOSIS — K219 Gastro-esophageal reflux disease without esophagitis: Secondary | ICD-10-CM | POA: Diagnosis present

## 2017-08-21 DIAGNOSIS — J9601 Acute respiratory failure with hypoxia: Secondary | ICD-10-CM | POA: Diagnosis not present

## 2017-08-21 DIAGNOSIS — J9811 Atelectasis: Secondary | ICD-10-CM | POA: Diagnosis not present

## 2017-08-21 DIAGNOSIS — J449 Chronic obstructive pulmonary disease, unspecified: Secondary | ICD-10-CM | POA: Diagnosis not present

## 2017-08-21 DIAGNOSIS — G893 Neoplasm related pain (acute) (chronic): Secondary | ICD-10-CM

## 2017-08-21 DIAGNOSIS — Z515 Encounter for palliative care: Secondary | ICD-10-CM

## 2017-08-21 DIAGNOSIS — D6481 Anemia due to antineoplastic chemotherapy: Secondary | ICD-10-CM | POA: Diagnosis present

## 2017-08-21 DIAGNOSIS — M549 Dorsalgia, unspecified: Secondary | ICD-10-CM | POA: Diagnosis not present

## 2017-08-21 DIAGNOSIS — R0602 Shortness of breath: Secondary | ICD-10-CM | POA: Diagnosis not present

## 2017-08-21 DIAGNOSIS — R339 Retention of urine, unspecified: Secondary | ICD-10-CM | POA: Diagnosis present

## 2017-08-21 DIAGNOSIS — C787 Secondary malignant neoplasm of liver and intrahepatic bile duct: Secondary | ICD-10-CM | POA: Diagnosis not present

## 2017-08-21 DIAGNOSIS — F411 Generalized anxiety disorder: Secondary | ICD-10-CM | POA: Diagnosis present

## 2017-08-21 DIAGNOSIS — J9621 Acute and chronic respiratory failure with hypoxia: Secondary | ICD-10-CM | POA: Diagnosis not present

## 2017-08-21 DIAGNOSIS — M25511 Pain in right shoulder: Secondary | ICD-10-CM | POA: Diagnosis not present

## 2017-08-21 DIAGNOSIS — R05 Cough: Secondary | ICD-10-CM | POA: Diagnosis not present

## 2017-08-21 DIAGNOSIS — J189 Pneumonia, unspecified organism: Secondary | ICD-10-CM | POA: Diagnosis not present

## 2017-08-21 DIAGNOSIS — C7951 Secondary malignant neoplasm of bone: Secondary | ICD-10-CM | POA: Diagnosis not present

## 2017-08-21 DIAGNOSIS — E440 Moderate protein-calorie malnutrition: Secondary | ICD-10-CM | POA: Diagnosis not present

## 2017-08-21 DIAGNOSIS — M25552 Pain in left hip: Secondary | ICD-10-CM | POA: Diagnosis not present

## 2017-08-21 DIAGNOSIS — M4690 Unspecified inflammatory spondylopathy, site unspecified: Secondary | ICD-10-CM | POA: Diagnosis present

## 2017-08-21 DIAGNOSIS — M8458XA Pathological fracture in neoplastic disease, other specified site, initial encounter for fracture: Secondary | ICD-10-CM | POA: Diagnosis not present

## 2017-08-21 DIAGNOSIS — C801 Malignant (primary) neoplasm, unspecified: Secondary | ICD-10-CM | POA: Diagnosis not present

## 2017-08-21 DIAGNOSIS — K5900 Constipation, unspecified: Secondary | ICD-10-CM | POA: Diagnosis not present

## 2017-08-21 DIAGNOSIS — E43 Unspecified severe protein-calorie malnutrition: Secondary | ICD-10-CM | POA: Diagnosis not present

## 2017-08-21 DIAGNOSIS — C799 Secondary malignant neoplasm of unspecified site: Secondary | ICD-10-CM | POA: Diagnosis not present

## 2017-08-21 DIAGNOSIS — C771 Secondary and unspecified malignant neoplasm of intrathoracic lymph nodes: Secondary | ICD-10-CM | POA: Diagnosis not present

## 2017-08-21 DIAGNOSIS — F329 Major depressive disorder, single episode, unspecified: Secondary | ICD-10-CM | POA: Diagnosis present

## 2017-08-21 DIAGNOSIS — G92 Toxic encephalopathy: Secondary | ICD-10-CM | POA: Diagnosis not present

## 2017-08-21 DIAGNOSIS — C499 Malignant neoplasm of connective and soft tissue, unspecified: Secondary | ICD-10-CM | POA: Diagnosis not present

## 2017-08-21 DIAGNOSIS — R109 Unspecified abdominal pain: Secondary | ICD-10-CM | POA: Diagnosis not present

## 2017-08-21 LAB — CBC
HEMATOCRIT: 24.1 % — AB (ref 39.0–52.0)
HEMOGLOBIN: 8.1 g/dL — AB (ref 13.0–17.0)
MCH: 33.2 pg (ref 26.0–34.0)
MCHC: 33.6 g/dL (ref 30.0–36.0)
MCV: 98.8 fL (ref 78.0–100.0)
Platelets: 196 10*3/uL (ref 150–400)
RBC: 2.44 MIL/uL — AB (ref 4.22–5.81)
RDW: 13.4 % (ref 11.5–15.5)
WBC: 2.4 10*3/uL — AB (ref 4.0–10.5)

## 2017-08-21 LAB — BASIC METABOLIC PANEL
ANION GAP: 9 (ref 5–15)
BUN: 9 mg/dL (ref 6–20)
CHLORIDE: 98 mmol/L — AB (ref 101–111)
CO2: 27 mmol/L (ref 22–32)
Calcium: 8.7 mg/dL — ABNORMAL LOW (ref 8.9–10.3)
Creatinine, Ser: 0.7 mg/dL (ref 0.61–1.24)
GFR calc Af Amer: >60 mL/min (ref >60)
Glucose, Bld: 89 mg/dL (ref 65–99)
POTASSIUM: 4 mmol/L (ref 3.5–5.1)
SODIUM: 134 mmol/L — AB (ref 135–145)

## 2017-08-21 MED ORDER — DEXAMETHASONE SODIUM PHOSPHATE 10 MG/ML IJ SOLN
10.0000 mg | Freq: Once | INTRAMUSCULAR | Status: AC
  Administered 2017-08-21: 10 mg via INTRAVENOUS
  Filled 2017-08-21: qty 1

## 2017-08-21 MED ORDER — IPRATROPIUM-ALBUTEROL 0.5-2.5 (3) MG/3ML IN SOLN
3.0000 mL | Freq: Four times a day (QID) | RESPIRATORY_TRACT | Status: DC
  Administered 2017-08-21 (×2): 3 mL via RESPIRATORY_TRACT
  Filled 2017-08-21 (×2): qty 3

## 2017-08-21 MED ORDER — MORPHINE SULFATE (PF) 2 MG/ML IV SOLN
4.0000 mg | INTRAVENOUS | Status: DC | PRN
  Administered 2017-08-21 – 2017-08-23 (×8): 4 mg via INTRAVENOUS
  Filled 2017-08-21 (×8): qty 2

## 2017-08-21 MED ORDER — SIMVASTATIN 20 MG PO TABS
20.0000 mg | ORAL_TABLET | Freq: Every day | ORAL | Status: DC
  Administered 2017-08-21 – 2017-09-03 (×12): 20 mg via ORAL
  Filled 2017-08-21 (×2): qty 2
  Filled 2017-08-21 (×10): qty 1

## 2017-08-21 MED ORDER — ENSURE ENLIVE PO LIQD
237.0000 mL | Freq: Two times a day (BID) | ORAL | Status: DC
  Administered 2017-08-21 – 2017-08-24 (×4): 237 mL via ORAL
  Filled 2017-08-21 (×7): qty 237

## 2017-08-21 MED ORDER — IPRATROPIUM-ALBUTEROL 0.5-2.5 (3) MG/3ML IN SOLN
3.0000 mL | Freq: Three times a day (TID) | RESPIRATORY_TRACT | Status: DC
  Administered 2017-08-22 – 2017-08-25 (×8): 3 mL via RESPIRATORY_TRACT
  Filled 2017-08-21 (×8): qty 3

## 2017-08-21 MED ORDER — MORPHINE SULFATE ER 15 MG PO TBCR
15.0000 mg | EXTENDED_RELEASE_TABLET | Freq: Three times a day (TID) | ORAL | Status: DC
  Administered 2017-08-21 – 2017-08-22 (×3): 15 mg via ORAL
  Filled 2017-08-21 (×3): qty 1

## 2017-08-21 MED ORDER — SENNA 8.6 MG PO TABS
2.0000 | ORAL_TABLET | Freq: Every day | ORAL | Status: DC
  Administered 2017-08-21 – 2017-08-25 (×5): 17.2 mg via ORAL
  Filled 2017-08-21 (×5): qty 2

## 2017-08-21 MED ORDER — MORPHINE SULFATE (PF) 4 MG/ML IV SOLN
4.0000 mg | INTRAVENOUS | Status: DC | PRN
  Administered 2017-08-21 (×2): 4 mg via INTRAVENOUS
  Filled 2017-08-21 (×2): qty 1

## 2017-08-21 MED ORDER — PREGABALIN 75 MG PO CAPS
75.0000 mg | ORAL_CAPSULE | Freq: Two times a day (BID) | ORAL | Status: DC
  Administered 2017-08-21 – 2017-08-24 (×6): 75 mg via ORAL
  Filled 2017-08-21 (×6): qty 1

## 2017-08-21 NOTE — Progress Notes (Signed)
Patient arrive don unit via bed from ED.  No family at bedside.

## 2017-08-21 NOTE — ED Notes (Signed)
Bed: ZC02 Expected date:  Expected time:  Means of arrival:  Comments: Ermalene Postin

## 2017-08-21 NOTE — Progress Notes (Signed)
PROGRESS NOTE  Tanner [name] WCH:[id number] DOB: April 29, 1956 DOA: 08/20/2017 PCP: [name], NP   LOS: 0 days   Brief Narrative / Interim history: Tanner [name] is a 62 y.o. 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  Family reports that he has pain is been difficult to control at home he is got multiple bony lytic lesions with rib fractures.  They just adjusted his pain medications last week.  He has been short of breath at home and coughing.  Assessment & Plan: Principal Problem:   Acute respiratory failure with hypoxia (HCC) Active Problems:   Essential hypertension, benign   Generalized anxiety disorder   Bone metastasis (HCC)   Malignant tumor, spindle cell type (HCC)   COPD (chronic obstructive pulmonary disease) (HCC)  Metastatic spindle cell cancer -Stage IV, poorly differentiated spindle cell neoplasm, status post palliative radiotherapy, currently on systemic chemotherapy -With significant cancer related pain, transition to IV pain medications today -Consulted palliative care for pain control, appreciate input  Acute respiratory failure with hypoxia -CT scan of the chest with known metastatic disease but no apparent acute findings -He has a history of COPD however no wheezing on exam -Suspect multifactorial due to poor inspiratory effort in the setting of multiple rib metastasis COPD -Pain control, supplemental oxygen  COPD -No active wheezing, continue duo nebs as needed -Possibly he has a degree of chronic respiratory failure  Hypertension -Controlled 108/75, continue metoprolol, amlodipine, closely monitor blood pressure with increased pain medications  Hyperlipidemia -Continue statin   DVT prophylaxis: SCDs Code Status: Full code Family Communication: no family at bedside Disposition Plan: home when ready    Consultants:   Palliative care  Procedures:   None   Antimicrobials:  None    Subjective: -Complains of generalized pain on anterior chest, barely able to take deep breaths  Objective: Vitals:   08/21/17 0943 08/21/17 1017 08/21/17 1113 08/21/17 1252  BP: 99/70 108/75 108/75 114/77  Pulse: 95 100 (!) 106 80  Resp: 17  17 16   Temp:      TempSrc:      SpO2: 99%  99% 99%  Weight:      Height:        Intake/Output Summary (Last 24 hours) at 08/21/2017 1330 Last data filed at 08/20/2017 2217 Gross per 24 hour  Intake 1300 ml  Output -  Net 1300 ml   Filed Weights   08/20/17 1824  Weight: 68 kg (150 lb)    Examination:  Constitutional: NAD Eyes: , lids and conjunctivae normal ENMT: Mucous membranes are moist. Respiratory: clear to auscultation bilaterally, no wheezing, no crackles. Normal respiratory effort.  Decreased breath sounds Cardiovascular: Regular rate and rhythm, no murmurs / rubs / gallops. No LE edema. 2+ pedal pulses. Abdomen: no tenderness.  Musculoskeletal: no clubbing / cyanosis Neurologic: Nonfocal Psychiatric: Normal judgment and insight. Alert and oriented x 3. Normal mood.    Data Reviewed: I have independently reviewed following labs and imaging studies   CBC: Recent Labs  Lab 08/20/17 1851 08/21/17 0450  WBC 2.2* 2.4*  NEUTROABS 1.2*  --   HGB 8.7* 8.1*  HCT 25.2* 24.1*  MCV 97.7 98.8  PLT 214 353   Basic Metabolic Panel: Recent Labs  Lab 08/20/17 1851 08/21/17 0450  NA 134* 134*  K 4.0 4.0  CL 96* 98*  CO2 26 27  GLUCOSE 94 89  BUN 11 9  CREATININE 0.76 0.70  CALCIUM 9.2 8.7*   GFR: Estimated Creatinine Clearance: 95.6 mL/min (by C-G formula based on SCr of 0.7 mg/dL). Liver Function Tests: Recent Labs  Lab 08/20/17 1851  AST 21  ALT 13*  ALKPHOS 94  BILITOT 0.2*  PROT 6.4*  ALBUMIN 2.4*   No results for input(s): LIPASE, AMYLASE in the last 168 hours. No results for input(s): AMMONIA in the last 168  hours. Coagulation Profile: No results for input(s): INR, PROTIME in the last 168 hours. Cardiac Enzymes: No results for input(s): CKTOTAL, CKMB, CKMBINDEX, TROPONINI in the last 168 hours. BNP (last 3 results) No results for input(s): PROBNP in the last 8760 hours. HbA1C: No results for input(s): HGBA1C in the last 72 hours. CBG: No results for input(s): GLUCAP in the last 168 hours. Lipid Profile: No results for input(s): CHOL, HDL, LDLCALC, TRIG, CHOLHDL, LDLDIRECT in the last 72 hours. Thyroid Function Tests: No results for input(s): TSH, T4TOTAL, FREET4, T3FREE, THYROIDAB in the last 72 hours. Anemia Panel: No results for input(s): VITAMINB12, FOLATE, FERRITIN, TIBC, IRON, RETICCTPCT in the last 72 hours. Urine analysis:    Component Value Date/Time   COLORURINE STRAW (A) 05/02/2017 2317   APPEARANCEUR CLEAR 05/02/2017 2317   LABSPEC 1.010 05/02/2017 2317   PHURINE 6.0 05/02/2017 2317   GLUCOSEU NEGATIVE 05/02/2017 2317   HGBUR NEGATIVE 05/02/2017 2317   BILIRUBINUR NEGATIVE 05/02/2017 2317   KETONESUR NEGATIVE 05/02/2017 2317   PROTEINUR NEGATIVE 05/02/2017 2317   UROBILINOGEN 0.2 09/26/2013 1457   NITRITE NEGATIVE 05/02/2017 2317   LEUKOCYTESUR NEGATIVE 05/02/2017 2317   Sepsis Labs: Invalid input(s): PROCALCITONIN, LACTICIDVEN  No results found for this or any previous visit (from the past 240 hour(s)).    Radiology Studies: Dg Chest 2 View  Result Date: 08/20/2017 CLINICAL DATA:  Dyspnea EXAM: CHEST  2 VIEW COMPARISON:  05/02/2017 chest radiograph. FINDINGS: Stable cardiomediastinal silhouette with normal heart size. No pneumothorax. No pleural effusion. New patchy left lower lobe lung opacity. No pulmonary edema. Stable chronic mid thoracic vertebral compression fracture with associated kyphotic angulation. IMPRESSION: New patchy left lower lobe lung opacity, which may represent a pneumonia in the correct clinical setting. Recommend attention on short-term  follow-up post treatment chest imaging. Electronically Signed   By: Ilona Sorrel M.D.   On: 08/20/2017 19:42   Ct Angio Chest Pe W And/or Wo Contrast  Result Date: 08/20/2017 CLINICAL DATA:  Dyspnea and diffuse pain. Patient is on chemotherapy for bone and lung cancer. EXAM: CT ANGIOGRAPHY CHEST WITH CONTRAST TECHNIQUE: Multidetector CT imaging of the chest was performed using the standard protocol during bolus administration of intravenous contrast. Multiplanar CT image reconstructions and MIPs were obtained to evaluate the vascular anatomy. CONTRAST:  74mL ISOVUE-370 IOPAMIDOL (ISOVUE-370) INJECTION 76% COMPARISON:  PET-CT 05/11/2017, chest CT 05/03/2017. FINDINGS: Cardiovascular: Heart is normal in size. No pericardial effusion is identified. There is coronary arteriosclerosis along the left main and LAD. No acute pulmonary embolus. Satisfactory opacification of the pulmonary arteries. There is minimal aortic atherosclerosis. No aneurysm or dissection. Mediastinum/Nodes: Interval increase in prevascular lymph node measuring 2.8 x 1.7 cm, series 4, image 27 versus 1.1 x 0.5 cm on prior chest CT. Trachea and mainstem bronchi appear patent. The thoracic esophagus is unremarkable. No thyromegaly or mass. No supraclavicular, axillary nor hilar lymphadenopathy. Lungs/Pleura: Interval increase in size of previously noted medial left upper lobe mass abutting the superior mediastinum, currently estimated at 3.2 x 2.2 x 2.1 cm previously estimated at  2.2 x 2.1 x 1.4 cm. No additional pulmonary masses. There is atelectasis along the posterior aspect of both lower lobes. Upper Abdomen: Inhomogeneous appearance of the included right hepatic lobe concerning for metastasis. Included left adrenal gland is unremarkable. The right adrenal gland is excluded. Musculoskeletal: The following osteolytic metastatic foci and/or fractures are noted: 1. Left glenoid neck. 2. Right lateral third and sixth rib fractures likely  pathologic. Left lateral sixth rib fracture. 3. Bony expansile 3.7 x 2.8 cm right lateral fifth rib mass, bony expansile lytic 3.3 x 2.5 cm right posterolateral seventh rib and 1.5 x 2.4 cm right lateral eighth rib lytic masses. 4. 6.8 x 3.4 cm bony expansile lytic mass of the posterior left eighth rib likely accounting for the opacity seen radiographically, expansile lytic 3.8 x 2 cm left posterolateral ninth rib mass. 5. Redemonstration of lower thoracic kyphosis with multilevel lytic lesions of the thoracic spine namely T2 through T5, T7, T9 and T10. Review of the MIP images confirms the above findings. IMPRESSION: 1. The left lower lobe opacity seen on same day chest radiograph is not attributable to a pneumonia but due to a bony expansile lytic lesion involving the posterior left eighth rib measuring 6.8 x 3.4 cm. Additional bony expansile lytic metastasis involving numerous bilateral ribs as above described with osteolytic lesions of the thoracic spine are in keeping with metastasis. Additional osteolytic abnormalities involving the left glenoid neck with pathologic bilateral rib fractures. 2. Ill-defined partially included hypodensities in the right hepatic lobe are concerning for hepatic metastasis, also new since prior. 3. Interval increase in left upper lobe paramediastinal mass now measuring 3.2 x 2.2 x 2.1 cm versus 2.2 x 2.1 x 1.4 cm previously. Interval increase size of a prevascular lymph node the abutting the aortic arch now measuring 2.8 x 1.7 cm versus 1.1 x 0.5 cm on prior chest CT. 4. No acute pulmonary embolus. 5. Coronary arteriosclerosis. Electronically Signed   By: Ashley Royalty M.D.   On: 08/20/2017 22:07     Scheduled Meds: . amLODipine  10 mg Oral Daily  . docusate sodium  100 mg Oral BID  . guaiFENesin  600 mg Oral BID  . ipratropium-albuterol  3 mL Nebulization Q6H  . metoprolol tartrate  100 mg Oral BID  . morphine  15 mg Oral Q8H  . pantoprazole  40 mg Oral Daily  .  polyethylene glycol  17 g Oral Daily  . PRUTECT  1 application Topical BID  . sodium chloride flush  3 mL Intravenous Q12H  . thiamine  100 mg Oral Daily   Continuous Infusions: . sodium chloride        Marzetta Board, MD, PhD [facility] Pager [phone]  If 7PM-7AM, please contact night-coverage [email or web address] Password [email or web address] 08/21/2017, 1:30 PM

## 2017-08-21 NOTE — Care Management Obs Status (Signed)
[facility] NOTIFICATION   Patient Details  Name: Tanner [name] MRN: [id number] Date of Birth: 08-11-1955   Medicare Observation Status Notification Given:  Yes.  Pt refused to sign due to pain.  Information provided to pt and copy of [facility] letter given with observation reason of acute respiratory failure with hypoxia.    Bensyn Bornemann, Benjaman Lobe, RN 08/21/2017, 12:22 PM

## 2017-08-21 NOTE — ED Notes (Signed)
Pt wanted to wait on the breathing treatments stated that "he was breathing pretty good right now" and for the time just wanted to take sleep medication to go to bed

## 2017-08-21 NOTE — ED Notes (Signed)
Daughter Aldona Bar [phone]

## 2017-08-21 NOTE — Consult Note (Signed)
Consultation Note Date: 08/21/2017   Patient Name: Tanner [name]  DOB: [birth year]  MRN: [id number]  Age / Sex: 62 y.o., male  PCP: Tanner Sell, NP Referring Physician: Caren Griffins, MD  Reason for Consultation: Pain control  HPI/Patient Profile: 62 y.o. 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 Palliative care requested for assistance in pain management.   Clinical Assessment and Goals of Care: Reviewed Tanner [name]'s chart and pain regimens. Unfortunate gentleman with newly diagnosed stage IV cancer. Reviewed oncology notes from here and from [name]. Per notes, Tanner [name] has recently inquired about hospice services as well with a plan to discuss further once symptoms better controlled. His family has just left.   I met today with Tanner [name] but he is very tired after receiving morphine. Tells me that he has excruciating pain across his shoulder blades. He says the IV morphine helps because it makes him sleep away the pain. We discussed adding adjunctives to his pain regimen. He agrees to this. He is very exhausted and just wants to rest. Emotional support provided.   Continue IV morphine over night. Will return for further pain recommendations and discussion with Tanner [name] in the morning.   Primary Decision Maker PATIENT    SUMMARY OF RECOMMENDATIONS   - Adding steroid and Lyrica today - Bowel Regimen - Will reassess opioid recommendations tomorrow  Code Status/Advance Care Planning:  Full code - did not discuss today   Symptom Management:   Bone mets pain: May require PCA to assess pain needs.  Continue MS Contin and prn morphine overnight. Good relief from prn IV morphine and will make further recommendations tomorrow based on med use tonight.   Adding decadron 10 mg IV now.   Adding Lyrica 75 mg po BID.   LBM: Pt reports last good BM ~1 week ago. Senokot 2 tabs daily.   Palliative Prophylaxis:   Bowel Regimen, Delirium Protocol, Frequent Pain Assessment and Turn Reposition  Additional Recommendations (Limitations, Scope, Preferences):  Full Scope Treatment  Psycho-social/Spiritual:   Desire for further Chaplaincy support:yes  Additional Recommendations: Caregiving  Support/Resources and Education on Hospice  Prognosis:   Overall very poor with advanced cancer  Discharge Planning: To Be Determined      Primary Diagnoses: Present on Admission: . Acute respiratory failure with hypoxia ([place]) . Bone metastasis ([place]) . Essential hypertension, benign . Generalized anxiety disorder . Malignant tumor, spindle cell type ([place]) . COPD (chronic obstructive pulmonary disease) ([place])   I have reviewed the medical record, interviewed the patient and family, and examined the patient. The following aspects are pertinent.  Past Medical History:  Diagnosis Date  . Alcohol abuse   . Alcoholic hepatitis   . Allergy   . Anxiety   . 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 C. difficile colitis   . Depression   . DVT (deep venous thrombosis) ([place]) 2015  . GERD (gastroesophageal reflux disease)   . Hypertension   . Ileus ([place]) 09/2013  . 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 Shortness of breath    new onset- occasionally-at rest and exertion  . Substance abuse ([place] [name])   . TIA (transient ischemic attack) 08/2013   Social History   Socioeconomic History  . Marital status: Divorced    Spouse name: None  . Number of children: 1  . Years of education: 71  . Highest education level: None  Social Needs  . Financial resource strain: None  . Food insecurity - worry:  None  . Food insecurity - inability: None  . Transportation needs - medical: None  . Transportation needs - non-medical: None  Occupational History  . Occupation: disabled    Comment: Fingers, scolisosis,   Tobacco Use  . Smoking status: Former Smoker    Packs/day: 1.50    Years: 30.00    Pack years: 45.00    Types: Cigarettes    Last attempt to quit: 03/09/2017    Years since quitting: 0.4  . Smokeless tobacco: Never Used  Substance and Sexual Activity  . Alcohol use: Yes    Alcohol/week: 3.0 - 6.0 oz    Types: 5 - 10 Cans of beer per week  . Drug use: Yes    Types: Marijuana  . Sexual activity: None  Other Topics Concern  . None  Social History Narrative   Denies   Family History  Problem Relation Age of Onset  . Lung cancer Father        smoker  . COPD Father   . Emphysema Father   . Arthritis Mother   . Pancreatic cancer Sister    Scheduled Meds: . amLODipine  10 mg Oral Daily  . dexamethasone  10 mg Intravenous Once  . docusate sodium  100 mg Oral BID  . feeding supplement (ENSURE ENLIVE)  237 mL Oral BID BM  . guaiFENesin  600 mg Oral BID  . ipratropium-albuterol  3 mL Nebulization Q6H  . metoprolol tartrate  100 mg Oral BID  . morphine  15 mg Oral Q8H  . pantoprazole  40 mg Oral Daily  . polyethylene glycol  17 g Oral Daily  . pregabalin  75 mg Oral BID  . PRUTECT  1 application Topical BID  . senna  2 tablet Oral Daily  . simvastatin  20 mg Oral q1800  . sodium chloride flush  3 mL Intravenous Q12H  . thiamine  100 mg Oral Daily   Continuous Infusions: . sodium chloride     PRN Meds:.sodium chloride, albuterol, diazepam, morphine injection, ondansetron, oxyCODONE, sodium chloride flush, traZODone Allergies  Allergen Reactions  . Dilaudid [Hydromorphone Hcl] Nausea And Vomiting    Pre pt history  . Procaine Hcl Other (See Comments)    08/10/2017: The patient indicated that Novocaine was used by a dermatologist prior to obtaining biopsy on his back  "many years ago". Patient experienced a syncopal reaction by his report. The patient has since had dental anesthetic without any adverse reactions.   . Sulfa Antibiotics Rash   Review of Systems  Unable to perform ROS: Acuity of condition    Physical Exam  Constitutional: He is oriented to person, place, and time. He appears well-developed. He has a sickly appearance.  HENT:  Head: Normocephalic and atraumatic.  Cardiovascular: Normal rate and regular rhythm.  Pulmonary/Chest: Effort normal. No accessory muscle usage. No tachypnea. No respiratory distress.  Abdominal: Normal appearance.  Neurological: He is alert and oriented to person, place, and time.  Sleepy   Nursing note and vitals reviewed.   Vital Signs: BP 105/70 (BP Location: Right Arm)   Pulse 77   Temp 98.2 F (36.8 C) (Oral)   Resp 16   Ht 5' 10"  (1.778 m)   Wt 68 kg (150 lb)   SpO2 100%   BMI 21.52 kg/m  Pain Assessment: 0-10   Pain Score: 8    SpO2: SpO2: 100 % O2 Device:SpO2: 100 % O2 Flow Rate: .O2 Flow Rate (L/min): 3 L/min  IO: Intake/output summary:   Intake/Output Summary (Last 24 hours) at 08/21/2017 1733 Last data filed at 08/21/2017 1707 Gross per 24 hour  Intake 1300 ml  Output 300 ml  Net 1000 ml    LBM: Last BM Date: 08/20/17 Baseline Weight: Weight: 68 kg (150 lb) Most recent weight: Weight: 68 kg (150 lb)     Palliative Assessment/Data: 30%    Time Total: 70 min  Greater than 50%  of this time was spent counseling and coordinating care related to the above assessment and plan.  Signed by: Vinie Sill, NP Palliative Medicine Team Pager # [phone] (M-F 8a-5p) Team Phone # [phone] (Nights/Weekends)

## 2017-08-22 ENCOUNTER — Inpatient Hospital Stay (HOSPITAL_COMMUNITY): Payer: Medicare HMO

## 2017-08-22 DIAGNOSIS — Z7189 Other specified counseling: Secondary | ICD-10-CM

## 2017-08-22 LAB — BASIC METABOLIC PANEL
Anion gap: 11 (ref 5–15)
BUN: 8 mg/dL (ref 6–20)
CHLORIDE: 95 mmol/L — AB (ref 101–111)
CO2: 27 mmol/L (ref 22–32)
CREATININE: 0.67 mg/dL (ref 0.61–1.24)
Calcium: 9.8 mg/dL (ref 8.9–10.3)
GFR calc Af Amer: >60 mL/min (ref >60)
Glucose, Bld: 134 mg/dL — ABNORMAL HIGH (ref 65–99)
Potassium: 4.6 mmol/L (ref 3.5–5.1)
SODIUM: 133 mmol/L — AB (ref 135–145)

## 2017-08-22 LAB — CBC
HCT: 31.4 % — ABNORMAL LOW (ref 39.0–52.0)
HEMOGLOBIN: 10.5 g/dL — AB (ref 13.0–17.0)
MCH: 32.9 pg (ref 26.0–34.0)
MCHC: 33.4 g/dL (ref 30.0–36.0)
MCV: 98.4 fL (ref 78.0–100.0)
Platelets: 222 10*3/uL (ref 150–400)
RBC: 3.19 MIL/uL — AB (ref 4.22–5.81)
RDW: 13.2 % (ref 11.5–15.5)
WBC: 2.5 10*3/uL — ABNORMAL LOW (ref 4.0–10.5)

## 2017-08-22 MED ORDER — LIDOCAINE 5 % EX PTCH
1.0000 | MEDICATED_PATCH | CUTANEOUS | Status: DC
  Administered 2017-08-22 – 2017-08-24 (×3): 1 via TRANSDERMAL
  Filled 2017-08-22 (×4): qty 1

## 2017-08-22 MED ORDER — MORPHINE SULFATE ER 30 MG PO TBCR
45.0000 mg | EXTENDED_RELEASE_TABLET | Freq: Three times a day (TID) | ORAL | Status: DC
  Administered 2017-08-22 – 2017-08-25 (×9): 45 mg via ORAL
  Filled 2017-08-22 (×10): qty 1

## 2017-08-22 MED ORDER — DEXAMETHASONE 4 MG PO TABS
8.0000 mg | ORAL_TABLET | Freq: Three times a day (TID) | ORAL | Status: DC
  Administered 2017-08-22 – 2017-09-04 (×40): 8 mg via ORAL
  Filled 2017-08-22 (×3): qty 2
  Filled 2017-08-22: qty 4
  Filled 2017-08-22: qty 2
  Filled 2017-08-22: qty 4
  Filled 2017-08-22 (×2): qty 2
  Filled 2017-08-22: qty 4
  Filled 2017-08-22 (×3): qty 2
  Filled 2017-08-22: qty 4
  Filled 2017-08-22 (×10): qty 2
  Filled 2017-08-22 (×3): qty 4
  Filled 2017-08-22 (×2): qty 2
  Filled 2017-08-22: qty 4
  Filled 2017-08-22: qty 2
  Filled 2017-08-22: qty 4
  Filled 2017-08-22 (×7): qty 2
  Filled 2017-08-22: qty 4
  Filled 2017-08-22 (×3): qty 2

## 2017-08-22 MED ORDER — DICLOFENAC SODIUM 1 % TD GEL
2.0000 g | Freq: Four times a day (QID) | TRANSDERMAL | Status: DC
  Administered 2017-08-22 (×2): 2 g via TOPICAL
  Filled 2017-08-22: qty 100

## 2017-08-22 MED ORDER — MAGNESIUM CITRATE PO SOLN
1.0000 | Freq: Once | ORAL | Status: AC
  Administered 2017-08-22: 1 via ORAL
  Filled 2017-08-22 (×2): qty 296

## 2017-08-22 MED ORDER — OXYCODONE HCL 5 MG PO TABS
10.0000 mg | ORAL_TABLET | ORAL | Status: DC | PRN
  Administered 2017-08-22 – 2017-08-23 (×3): 10 mg via ORAL
  Filled 2017-08-22 (×3): qty 2

## 2017-08-22 NOTE — Progress Notes (Signed)
Daily Progress Note   Patient Name: Tanner [name]       Date: 08/22/2017 DOB: 11-04-1955  Age: 62 y.o. MRN#: [id number] Attending Physician: Elodia Florence., * Primary Care Physician: [name], NP Admit Date: 08/20/2017  Reason for Consultation/Follow-up: Pain control  Subjective: Tanner [name] continues to have pain this morning in ribs and between shoulder blades. This is severe debilitating pain.   Length of Stay: 1  Current Medications: Scheduled Meds:  . amLODipine  10 mg Oral Daily  . dexamethasone  8 mg Oral Q8H  . diclofenac sodium  2 g Topical QID  . docusate sodium  100 mg Oral BID  . feeding supplement (ENSURE ENLIVE)  237 mL Oral BID BM  . guaiFENesin  600 mg Oral BID  . ipratropium-albuterol  3 mL Nebulization TID  . lidocaine  1 patch Transdermal Q24H  . metoprolol tartrate  100 mg Oral BID  . morphine  45 mg Oral Q8H  . pantoprazole  40 mg Oral Daily  . polyethylene glycol  17 g Oral Daily  . pregabalin  75 mg Oral BID  . PRUTECT  1 application Topical BID  . senna  2 tablet Oral Daily  . simvastatin  20 mg Oral q1800  . sodium chloride flush  3 mL Intravenous Q12H  . thiamine  100 mg Oral Daily    Continuous Infusions: . sodium chloride      PRN Meds: sodium chloride, albuterol, diazepam, morphine injection, ondansetron, oxyCODONE, sodium chloride flush, traZODone  Physical Exam  Constitutional: He is oriented to person, place, and time. He appears well-developed. He appears ill.  HENT:  Head: Normocephalic and atraumatic.  Cardiovascular: Normal rate and regular rhythm.  Pulmonary/Chest: No accessory muscle usage. No tachypnea. He is in respiratory distress.  Slightly distressed with movement and conversation but mostly d/t pain response    Abdominal: Normal appearance.  Neurological: He is alert and oriented to person, place, and time.  Nursing note and vitals reviewed.           Vital Signs: BP 110/73   Pulse 95   Temp 98 F (36.7 C) (Oral)   Resp 18   Ht 5' 10"  (1.778 m)   Wt 68 kg (150 lb)   SpO2 95%   BMI 21.52 kg/m  SpO2: SpO2:  95 % O2 Device: O2 Device: Nasal Cannula O2 Flow Rate: O2 Flow Rate (L/min): 3 L/min  Intake/output summary:   Intake/Output Summary (Last 24 hours) at 08/22/2017 0956 Last data filed at 08/22/2017 6629 Gross per 24 hour  Intake 420 ml  Output 1200 ml  Net -780 ml   LBM: Last BM Date: 08/20/17 Baseline Weight: Weight: 68 kg (150 lb) Most recent weight: Weight: 68 kg (150 lb)       Palliative Assessment/Data: 30%      Patient Active Problem List   Diagnosis Date Noted  . Cancer associated pain   . Palliative care encounter   . Hypoxia 08/20/2017  . Acute respiratory failure with hypoxia ([place]) 08/20/2017  . COPD (chronic obstructive pulmonary disease) ([place]) 08/20/2017  . Constipation 08/14/2017  . Malignant tumor, spindle cell type ([place]) 07/16/2017  . Goals of care, counseling/discussion 07/16/2017  . Chronic low back pain 07/04/2017  . Bone metastasis ([place]) 06/23/2017  . COPD with acute exacerbation ([place]) 05/15/2017  . Multiple lung nodules on CT   . Near syncope 05/03/2017  . Lung mass 05/03/2017  . Hyperlipidemia 09/20/2016  . Protein-calorie malnutrition ([place]) 08/17/2016  . Thoracic compression fracture, closed, initial encounter ([place]) 08/17/2016  . Alcohol dependence ([place]) 08/17/2016  . Tobacco dependence 08/17/2016  . Osteoporosis 08/17/2016  . Hip fracture ([place]) 08/16/2016  . Headache 02/24/2016  . Hypertensive urgency 02/03/2016  . Shortness of breath 07/28/2015  . Generalized anxiety disorder 07/28/2015  . Medicare annual wellness visit, subsequent 05/24/2015  . Acute DVT of right tibial vein ([place]) 02/27/2015  . Tibia fracture 02/20/2015  .  Syncope 02/18/2015  . Essential hypertension 02/18/2015  . History of stroke 02/18/2015  . Fracture of tibia, right, closed   . C. difficile colitis 09/11/2013  . UTI (urinary tract infection) 09/11/2013  . Essential hypertension, benign 09/11/2013  . Ileus ([place]) 09/10/2013  . Spastic hemiplegia affecting dominant side ([place]) 09/09/2013  . Ileus, postoperative ([place]) 09/09/2013  . Hypokalemia 09/09/2013  . Nonspecific (abnormal) findings on radiological and other examination of gastrointestinal tract 09/09/2013  . CVA (cerebral infarction) 09/05/2013  . 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 Malignant neoplasm of prostate ([place]) 08/29/2013  . Hyponatremia 12/11/2012    Class: Chronic  . Ankle fracture 12/10/2012    Palliative Care Assessment & Plan   HPI: 62 y.o. 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 Palliative care requested for assistance in pain management.   Assessment: I met this morning with Tanner [name]. He continues to have severe pain but is more alert and able to have conversation with me today. Tanner [name] grimaces even reaching for water beside his bed. We discussed plan for pain management that will be implemented today. Discussed this plan with Dr. Florene Glen as well. Dr. Florene Glen plans to make sure Dr. Julien Nordmann know that he is here as well - Tanner [name] was happy to hear this as he wants him involved.   Tanner [name] is tearful as he has good understanding of the severity of his cancer. He asks me "is this the end?" I explained that we know that his cancer is not going to get better but I think we need to give it some time to work and see how well we can get his  pain managed before knowing where we go from  here. He tearfully tells me that he is mostly concerned as his daughter is pregnant and due April 14th and he wants to be here to meet the baby - this is his main goal. I explain that we understand this goal and will do everything in our power to help with his pain management and QOL to help him live as long as he is able.   With this said we did discuss code status and he informs me that he has already decided he would not want CPR, shock, intubation so DNR was placed. He does not want his daughter to have to go through this and make those decisions for him. He says that his ex-wife has moved in with him so he has good support at home as well and is not alone.   Recommendations/Plan:  Bone mets pain: Received total morphine equivalent of ~140 mg of po morphine over past 24 hours so I will place him on MS Contin 45 mg every 8 hours. Increasing OxyIR to 10 mg every 4 hours prn. Will continue morphine IV 4 mg every 2 hours prn for severe breakthrough pain. Monitor BP closely. Discussed plan with RN. Attending has added Lidoderm patch and Voltaren gel. Recommend Lidoderm patch between shoulder blades and Voltaren gel to ribs.   Adding decadron 8 mg po TID for now.   Continue Lyrica 75 mg po BID.   LBM: Pt reports last good BM ~1 week ago. Senokot 2 tabs daily. No BM yet. Will give mag citrate today.    Goals of Care and Additional Recommendations:  Limitations on Scope of Treatment: Full Scope Treatment  Code Status:  DNR - confirmed with patient today  Prognosis:   Extremely poor with progressing advanced cancer and worsening pain and functional status. Certainly eligible for hospice is forego further treatment.   Discharge Planning:  To Be Determined  Care plan was discussed with Dr. Florene Glen, RN  Thank you for allowing the Palliative Medicine Team to assist in the care of this patient.   Total Time 45 min Prolonged Time Billed  no       Greater than 50%  of this time was spent  counseling and coordinating care related to the above assessment and plan.  Vinie Sill, NP Palliative Medicine Team Pager # [phone] (M-F 8a-5p) Team Phone # [phone] (Nights/Weekends)

## 2017-08-22 NOTE — Progress Notes (Addendum)
PROGRESS NOTE    Tanner [name]  VZD:[id number] DOB: [birth year] DOA: 08/20/2017 PCP: [name], NP   Brief Narrative:  Tanner [name] 62 y.o.malewith medical history significant ofrecently diagnosed stage IV cancer of unknown primary distorted chemo 3 weeks ago for palliative treatment comes in with shortness of breath. Family reports that he has pain is been difficult to control at home he is got multiple bony lytic lesions with rib fractures. They just adjusted his pain medications last week. He has been short of breath at home and coughing.  Assessment & Plan:   Principal Problem:   Acute respiratory failure with hypoxia (HCC) Active Problems:   Essential hypertension, benign   Generalized anxiety disorder   Bone metastasis (HCC)   Malignant tumor, spindle cell type (HCC)   Hypoxia   COPD (chronic obstructive pulmonary disease) (HCC)   Cancer associated pain   Palliative care encounter   Metastatic spindle cell cancer -Stage IV, poorly differentiated spindle cell neoplasm, status post palliative radiotherapy, currently on systemic chemotherapy -With significant cancer related pain, transition to IV pain medications today  - Appreciate palliative assistance (MS contin 45 mg q8 hrs, oxycodone 10 mg q4, morphine 4 mg q2 prn breakthrough) - lyrica as well as decadron 8 po TID   - voltaren and lidocaine patch added for pain as well -Consulted palliative care for pain control, appreciate input - patient is now DNR  Acute respiratory failure with hypoxia - CT scan without PE, notable for metastatic disease and bony mets and pathologic rib fractures - has recent echo that showed normal EF, grade 1 diastolic dysfunction, does not appear overloaded on exam -He has Mulan Adan history of COPD, some scattered wheezing today, continue nebs and receiving steroids as above -Suspect multifactorial due to poor inspiratory effort in the setting of multiple rib metastasis COPD -Pain  control, supplemental oxygen  COPD -continue duo nebs as needed -Possibly he has Novalynn Branaman degree of chronic respiratory failure  Hypertension -Controlled, continue metoprolol, hold amlodipine.  Had some soft BP's yesterday which limited pain meds.  Continue to monitor closely.   R Shoulder Pain - chronic over past month or so, follow plain films  Hyperlipidemia -Continue statin  Anemia  Leukopenia: likely related to malignancy and chemotherapy, will continue to monitor  DVT prophylaxis: SCD Code Status: DNR Family Communication: none at bedside, attempted to call friend listed on file, but no answer Disposition Plan: pending   Consultants:   Palliative care  Procedures:   none  Antimicrobials:   none    Subjective: C/o pain in lower back and anterior chest. Presented due to pain and SOB.  SOB and pain feel Nealy Karapetian little better now. He was not on oxygen prior to this.   Objective: Vitals:   08/21/17 2035 08/21/17 2218 08/22/17 0540 08/22/17 0854  BP:  129/80 127/83   Pulse:  87 83   Resp:  18 18   Temp:  98.1 F (36.7 C) 98 F (36.7 C)   TempSrc:  Oral Oral   SpO2: 97% 98% 100% 95%  Weight:      Height:        Intake/Output Summary (Last 24 hours) at 08/22/2017 0910 Last data filed at 08/22/2017 0659 Gross per 24 hour  Intake 420 ml  Output 1200 ml  Net -780 ml   Filed Weights   08/20/17 1824  Weight: 68 kg (150 lb)    Examination:  General exam: Appears calm and comfortable  Respiratory system: Clear to auscultation.  Respiratory effort normal.  Occasional scattered wheeze. Cardiovascular system: S1 & S2 heard, RRR. No JVD, murmurs, rubs, gallops or clicks. No pedal edema. Gastrointestinal system: Abdomen is nondistended, soft and nontender. No organomegaly or masses felt. Normal bowel sounds heard. Central nervous system: Alert and oriented. No focal neurological deficits. MSK: ttp to lower back, R shoulder mild TTP Extremities: Symmetric 5 x 5  power. Skin: No rashes, lesions or ulcers Psychiatry: Judgement and insight appear normal. Mood & affect appropriate.     Data Reviewed: I have personally reviewed following labs and imaging studies  CBC: Recent Labs  Lab 08/20/17 1851 08/21/17 0450 08/22/17 0407  WBC 2.2* 2.4* 2.5*  NEUTROABS 1.2*  --   --   HGB 8.7* 8.1* 10.5*  HCT 25.2* 24.1* 31.4*  MCV 97.7 98.8 98.4  PLT 214 196 229   Basic Metabolic Panel: Recent Labs  Lab 08/20/17 1851 08/21/17 0450 08/22/17 0407  NA 134* 134* 133*  K 4.0 4.0 4.6  CL 96* 98* 95*  CO2 26 27 27   GLUCOSE 94 89 134*  BUN 11 9 8   CREATININE 0.76 0.70 0.67  CALCIUM 9.2 8.7* 9.8   GFR: Estimated Creatinine Clearance: 95.6 mL/min (by C-G formula based on SCr of 0.67 mg/dL). Liver Function Tests: Recent Labs  Lab 08/20/17 1851  AST 21  ALT 13*  ALKPHOS 94  BILITOT 0.2*  PROT 6.4*  ALBUMIN 2.4*   No results for input(s): LIPASE, AMYLASE in the last 168 hours. No results for input(s): AMMONIA in the last 168 hours. Coagulation Profile: No results for input(s): INR, PROTIME in the last 168 hours. Cardiac Enzymes: No results for input(s): CKTOTAL, CKMB, CKMBINDEX, TROPONINI in the last 168 hours. BNP (last 3 results) No results for input(s): PROBNP in the last 8760 hours. HbA1C: No results for input(s): HGBA1C in the last 72 hours. CBG: No results for input(s): GLUCAP in the last 168 hours. Lipid Profile: No results for input(s): CHOL, HDL, LDLCALC, TRIG, CHOLHDL, LDLDIRECT in the last 72 hours. Thyroid Function Tests: No results for input(s): TSH, T4TOTAL, FREET4, T3FREE, THYROIDAB in the last 72 hours. Anemia Panel: No results for input(s): VITAMINB12, FOLATE, FERRITIN, TIBC, IRON, RETICCTPCT in the last 72 hours. Sepsis Labs: Recent Labs  Lab 08/20/17 2309  LATICACIDVEN 0.39*    No results found for this or any previous visit (from the past 240 hour(s)).       Radiology Studies: Dg Chest 2 View  Result  Date: 08/20/2017 CLINICAL DATA:  Dyspnea EXAM: CHEST  2 VIEW COMPARISON:  05/02/2017 chest radiograph. FINDINGS: Stable cardiomediastinal silhouette with normal heart size. No pneumothorax. No pleural effusion. New patchy left lower lobe lung opacity. No pulmonary edema. Stable chronic mid thoracic vertebral compression fracture with associated kyphotic angulation. IMPRESSION: New patchy left lower lobe lung opacity, which may represent Darnise Montag pneumonia in the correct clinical setting. Recommend attention on short-term follow-up post treatment chest imaging. Electronically Signed   By: Ilona Sorrel M.D.   On: 08/20/2017 19:42   Ct Angio Chest Pe W And/or Wo Contrast  Result Date: 08/20/2017 CLINICAL DATA:  Dyspnea and diffuse pain. Patient is on chemotherapy for bone and lung cancer. EXAM: CT ANGIOGRAPHY CHEST WITH CONTRAST TECHNIQUE: Multidetector CT imaging of the chest was performed using the standard protocol during bolus administration of intravenous contrast. Multiplanar CT image reconstructions and MIPs were obtained to evaluate the vascular anatomy. CONTRAST:  44mL ISOVUE-370 IOPAMIDOL (ISOVUE-370) INJECTION 76% COMPARISON:  PET-CT 05/11/2017, chest CT 05/03/2017. FINDINGS: Cardiovascular:  Heart is normal in size. No pericardial effusion is identified. There is coronary arteriosclerosis along the left main and LAD. No acute pulmonary embolus. Satisfactory opacification of the pulmonary arteries. There is minimal aortic atherosclerosis. No aneurysm or dissection. Mediastinum/Nodes: Interval increase in prevascular lymph node measuring 2.8 x 1.7 cm, series 4, image 27 versus 1.1 x 0.5 cm on prior chest CT. Trachea and mainstem bronchi appear patent. The thoracic esophagus is unremarkable. No thyromegaly or mass. No supraclavicular, axillary nor hilar lymphadenopathy. Lungs/Pleura: Interval increase in size of previously noted medial left upper lobe mass abutting the superior mediastinum, currently estimated at  3.2 x 2.2 x 2.1 cm previously estimated at 2.2 x 2.1 x 1.4 cm. No additional pulmonary masses. There is atelectasis along the posterior aspect of both lower lobes. Upper Abdomen: Inhomogeneous appearance of the included right hepatic lobe concerning for metastasis. Included left adrenal gland is unremarkable. The right adrenal gland is excluded. Musculoskeletal: The following osteolytic metastatic foci and/or fractures are noted: 1. Left glenoid neck. 2. Right lateral third and sixth rib fractures likely pathologic. Left lateral sixth rib fracture. 3. Bony expansile 3.7 x 2.8 cm right lateral fifth rib mass, bony expansile lytic 3.3 x 2.5 cm right posterolateral seventh rib and 1.5 x 2.4 cm right lateral eighth rib lytic masses. 4. 6.8 x 3.4 cm bony expansile lytic mass of the posterior left eighth rib likely accounting for the opacity seen radiographically, expansile lytic 3.8 x 2 cm left posterolateral ninth rib mass. 5. Redemonstration of lower thoracic kyphosis with multilevel lytic lesions of the thoracic spine namely T2 through T5, T7, T9 and T10. Review of the MIP images confirms the above findings. IMPRESSION: 1. The left lower lobe opacity seen on same day chest radiograph is not attributable to Alyric Parkin pneumonia but due to Dwyane Dupree bony expansile lytic lesion involving the posterior left eighth rib measuring 6.8 x 3.4 cm. Additional bony expansile lytic metastasis involving numerous bilateral ribs as above described with osteolytic lesions of the thoracic spine are in keeping with metastasis. Additional osteolytic abnormalities involving the left glenoid neck with pathologic bilateral rib fractures. 2. Ill-defined partially included hypodensities in the right hepatic lobe are concerning for hepatic metastasis, also new since prior. 3. Interval increase in left upper lobe paramediastinal mass now measuring 3.2 x 2.2 x 2.1 cm versus 2.2 x 2.1 x 1.4 cm previously. Interval increase size of Delvin Hedeen prevascular lymph node the  abutting the aortic arch now measuring 2.8 x 1.7 cm versus 1.1 x 0.5 cm on prior chest CT. 4. No acute pulmonary embolus. 5. Coronary arteriosclerosis. Electronically Signed   By: Ashley Royalty M.D.   On: 08/20/2017 22:07        Scheduled Meds: . amLODipine  10 mg Oral Daily  . diclofenac sodium  2 g Topical QID  . docusate sodium  100 mg Oral BID  . feeding supplement (ENSURE ENLIVE)  237 mL Oral BID BM  . guaiFENesin  600 mg Oral BID  . ipratropium-albuterol  3 mL Nebulization TID  . lidocaine  1 patch Transdermal Q24H  . metoprolol tartrate  100 mg Oral BID  . morphine  15 mg Oral Q8H  . pantoprazole  40 mg Oral Daily  . polyethylene glycol  17 g Oral Daily  . pregabalin  75 mg Oral BID  . PRUTECT  1 application Topical BID  . senna  2 tablet Oral Daily  . simvastatin  20 mg Oral q1800  . sodium chloride flush  3 mL Intravenous Q12H  . thiamine  100 mg Oral Daily   Continuous Infusions: . sodium chloride       LOS: 1 day    Time spent: over 30 min    Fayrene Helper, MD [facility] Pager [phone]  If 7PM-7AM, please contact night-coverage [email or web address] Password [email or web address] 08/22/2017, 9:10 AM

## 2017-08-23 LAB — COMPREHENSIVE METABOLIC PANEL
ALBUMIN: 2.4 g/dL — AB (ref 3.5–5.0)
ALK PHOS: 87 U/L (ref 38–126)
ALT: 11 U/L — AB (ref 17–63)
AST: 17 U/L (ref 15–41)
Anion gap: 12 (ref 5–15)
BUN: 13 mg/dL (ref 6–20)
CALCIUM: 9.5 mg/dL (ref 8.9–10.3)
CHLORIDE: 94 mmol/L — AB (ref 101–111)
CO2: 30 mmol/L (ref 22–32)
CREATININE: 0.6 mg/dL — AB (ref 0.61–1.24)
GFR calc non Af Amer: >60 mL/min (ref >60)
GLUCOSE: 136 mg/dL — AB (ref 65–99)
Potassium: 4.7 mmol/L (ref 3.5–5.1)
SODIUM: 136 mmol/L (ref 135–145)
Total Bilirubin: 0.2 mg/dL — ABNORMAL LOW (ref 0.3–1.2)
Total Protein: 6.7 g/dL (ref 6.5–8.1)

## 2017-08-23 LAB — CBC
HCT: 27.6 % — ABNORMAL LOW (ref 39.0–52.0)
Hemoglobin: 9.1 g/dL — ABNORMAL LOW (ref 13.0–17.0)
MCH: 32.4 pg (ref 26.0–34.0)
MCHC: 33 g/dL (ref 30.0–36.0)
MCV: 98.2 fL (ref 78.0–100.0)
PLATELETS: 273 10*3/uL (ref 150–400)
RBC: 2.81 MIL/uL — AB (ref 4.22–5.81)
RDW: 13 % (ref 11.5–15.5)
WBC: 5.4 10*3/uL (ref 4.0–10.5)

## 2017-08-23 MED ORDER — MORPHINE SULFATE (PF) 2 MG/ML IV SOLN
4.0000 mg | INTRAVENOUS | Status: DC | PRN
  Administered 2017-08-23: 4 mg via INTRAVENOUS
  Filled 2017-08-23: qty 2

## 2017-08-23 MED ORDER — OXYCODONE HCL 5 MG PO TABS
15.0000 mg | ORAL_TABLET | ORAL | Status: DC | PRN
  Administered 2017-08-23 – 2017-08-25 (×10): 15 mg via ORAL
  Filled 2017-08-23 (×10): qty 3

## 2017-08-23 MED ORDER — BISACODYL 10 MG RE SUPP
10.0000 mg | Freq: Every day | RECTAL | Status: DC | PRN
  Administered 2017-08-29: 10 mg via RECTAL
  Filled 2017-08-23: qty 1

## 2017-08-23 NOTE — Progress Notes (Addendum)
PROGRESS NOTE    Tanner [name]  DSK:[id number] DOB: 01/09/1956 DOA: 08/20/2017 PCP: [name], NP   Brief Narrative:  Kathe Becton Caleb Decock 62 y.o.malewith medical history significant ofrecently diagnosed stage IV cancer of unknown primary distorted chemo 3 weeks ago for palliative treatment comes in with shortness of breath. Family reports that he has pain is been difficult to control at home he is got multiple bony lytic lesions with rib fractures. They just adjusted his pain medications last week. He has been short of breath at home and coughing.  Assessment & Plan:   Principal Problem:   Acute respiratory failure with hypoxia (HCC) Active Problems:   Essential hypertension, benign   Generalized anxiety disorder   Bone metastasis (HCC)   Malignant tumor, spindle cell type (HCC)   Hypoxia   COPD (chronic obstructive pulmonary disease) (HCC)   Cancer associated pain   Palliative care encounter   Metastatic spindle cell cancer -Stage IV, poorly differentiated spindle cell neoplasm, status post palliative radiotherapy, currently on systemic chemotherapy -With significant cancer related pain, transition to IV pain medications today  - Appreciate palliative assistance (MS contin 45 mg q8 hrs, oxycodone 15 mg q4, morphine 4 mg q4 prn breakthrough) - lyrica as well as decadron 8 po TID   - voltaren (d'c'd), lidocaine patch added for pain as well -Consulted palliative care for pain control, appreciate input - patient is now DNR - PT   Acute respiratory failure with hypoxia - Seems to be improving, wean O2 as tolerated (on 1 L this morning) - CT scan without PE, notable for metastatic disease and bony mets and pathologic rib fractures - has recent echo that showed normal EF, grade 1 diastolic dysfunction, does not appear overloaded on exam -He has O[place]ane Fosse history of COPD, some scattered wheezing today, continue nebs and receiving steroids as above -Suspect multifactorial due to  poor inspiratory effort in the setting of multiple rib metastasis COPD -Pain control, supplemental oxygen  COPD -continue duo nebs as needed -Possibly he has Jaxsyn Azam degree of chronic respiratory failure  Hypertension -Controlled, continue metoprolol, hold amlodipine.  Had some soft BP's yesterday which limited pain meds.  Continue to monitor closely.   R Shoulder Pain - negative plain films, continue to monitor  Hyperlipidemia -Continue statin  Anemia  Leukopenia: likely related to malignancy and chemotherapy, will continue to monitor  DVT prophylaxis: SCD Code Status: DNR Family Communication: none at bedside, daugther by phone 2/14 Disposition Plan: pending   Consultants:   Palliative care  Procedures:   none  Antimicrobials:   none    Subjective: Feeling Marquavis Hannen bit better overall.   Objective: Vitals:   08/22/17 1928 08/22/17 1956 08/23/17 0508 08/23/17 0913  BP:  101/74 114/71 121/76  Pulse:  86 72 76  Resp:  18 17 18   Temp:  97.8 F (36.6 C) 97.8 F (36.6 C) 97.9 F (36.6 C)  TempSrc:  Oral Oral Oral  SpO2: 99% 97% 97% 98%  Weight:      Height:        Intake/Output Summary (Last 24 hours) at 08/23/2017 0954 Last data filed at 08/23/2017 0507 Gross per 24 hour  Intake 720 ml  Output 1200 ml  Net -480 ml   Filed Weights   08/20/17 1824  Weight: 68 kg (150 lb)    Examination:  General: No acute distress. Cardiovascular: Heart sounds show Levander Katzenstein regular rate, and rhythm. No gallops or rubs. No murmurs. No JVD. Lungs: Clear to auscultation bilaterally with  good air movement. No rales, rhonchi or wheezes.  Pain with sitting forward and movement.  Abdomen: Soft, nontender, nondistended with normal active bowel sounds. No masses. No hepatosplenomegaly. Neurological: Alert and oriented 3. Moves all extremities 4 with equal strength. Cranial nerves II through XII grossly intact. Skin: Warm and dry. No rashes or lesions. Extremities: No clubbing or  cyanosis. No edema.  Psychiatric: Mood and affect are normal. Insight and judgment are appropriate.    Data Reviewed: I have personally reviewed following labs and imaging studies  CBC: Recent Labs  Lab 08/20/17 1851 08/21/17 0450 08/22/17 0407 08/23/17 0439  WBC 2.2* 2.4* 2.5* 5.4  NEUTROABS 1.2*  --   --   --   HGB 8.7* 8.1* 10.5* 9.1*  HCT 25.2* 24.1* 31.4* 27.6*  MCV 97.7 98.8 98.4 98.2  PLT 214 196 222 462   Basic Metabolic Panel: Recent Labs  Lab 08/20/17 1851 08/21/17 0450 08/22/17 0407 08/23/17 0439  NA 134* 134* 133* 136  K 4.0 4.0 4.6 4.7  CL 96* 98* 95* 94*  CO2 26 27 27 30   GLUCOSE 94 89 134* 136*  BUN 11 9 8 13   CREATININE 0.76 0.70 0.67 0.60*  CALCIUM 9.2 8.7* 9.8 9.5   GFR: Estimated Creatinine Clearance: 95.6 mL/min (Hara Milholland) (by C-G formula based on SCr of 0.6 mg/dL (L)). Liver Function Tests: Recent Labs  Lab 08/20/17 1851 08/23/17 0439  AST 21 17  ALT 13* 11*  ALKPHOS 94 87  BILITOT 0.2* 0.2*  PROT 6.4* 6.7  ALBUMIN 2.4* 2.4*   No results for input(s): LIPASE, AMYLASE in the last 168 hours. No results for input(s): AMMONIA in the last 168 hours. Coagulation Profile: No results for input(s): INR, PROTIME in the last 168 hours. Cardiac Enzymes: No results for input(s): CKTOTAL, CKMB, CKMBINDEX, TROPONINI in the last 168 hours. BNP (last 3 results) No results for input(s): PROBNP in the last 8760 hours. HbA1C: No results for input(s): HGBA1C in the last 72 hours. CBG: No results for input(s): GLUCAP in the last 168 hours. Lipid Profile: No results for input(s): CHOL, HDL, LDLCALC, TRIG, CHOLHDL, LDLDIRECT in the last 72 hours. Thyroid Function Tests: No results for input(s): TSH, T4TOTAL, FREET4, T3FREE, THYROIDAB in the last 72 hours. Anemia Panel: No results for input(s): VITAMINB12, FOLATE, FERRITIN, TIBC, IRON, RETICCTPCT in the last 72 hours. Sepsis Labs: Recent Labs  Lab 08/20/17 2309  LATICACIDVEN 0.39*    No results found  for this or any previous visit (from the past 240 hour(s)).       Radiology Studies: Dg Shoulder Right  Result Date: 08/22/2017 CLINICAL DATA:  Chronic shoulder pain EXAM: RIGHT SHOULDER - 2+ VIEW COMPARISON:  None. FINDINGS: There is no evidence of fracture or dislocation. There is generalized osteopenia. There is no evidence of arthropathy or other focal bone abnormality. Soft tissues are unremarkable. IMPRESSION: No acute osseous injury of the right shoulder. Electronically Signed   By: Kathreen Devoid   On: 08/22/2017 10:11        Scheduled Meds: . dexamethasone  8 mg Oral Q8H  . docusate sodium  100 mg Oral BID  . feeding supplement (ENSURE ENLIVE)  237 mL Oral BID BM  . guaiFENesin  600 mg Oral BID  . ipratropium-albuterol  3 mL Nebulization TID  . lidocaine  1 patch Transdermal Q24H  . metoprolol tartrate  100 mg Oral BID  . morphine  45 mg Oral Q8H  . pantoprazole  40 mg Oral Daily  .  polyethylene glycol  17 g Oral Daily  . pregabalin  75 mg Oral BID  . PRUTECT  1 application Topical BID  . senna  2 tablet Oral Daily  . simvastatin  20 mg Oral q1800  . sodium chloride flush  3 mL Intravenous Q12H  . thiamine  100 mg Oral Daily   Continuous Infusions: . sodium chloride       LOS: 2 days    Time spent: over 30 min    Fayrene Helper, MD [facility] Pager [phone]  If 7PM-7AM, please contact night-coverage [email or web address] Password [facility] 08/23/2017, 9:54 AM

## 2017-08-23 NOTE — Evaluation (Signed)
Physical Therapy Evaluation Patient Details Name: Tanner [name] MRN: [id number] DOB: 02-19-1956 Today's Date: 08/23/2017   History of Present Illness  62 yo male admitted with acute respiratory failure, pain issues. Hx of ETOH abuse, prostate cancer, stage IV spindle cell cancer, bony mets, rib fractures, Hep, DVT, CVA, HTN    Clinical Impression  On eval, pt required Min assist for mobility. He was able to walk ~15 feet with a RW. Distance is limited by pain, dyspnea. Pain rated 8/10 with activity. O2 sats dropped to 84% on RA during ambulation. Assisted pt back to bed at end of session. Will follow during hospital stay. Discussed d/c plan-pt would like to return home if possible. He is open to SNF if it becomes necessary.     Follow Up Recommendations Home health PT;Supervision/Assistance - 24 hour (depending on progress. May need to consider SNF it pt does not improve).     Equipment Recommendations  Wheelchair ;3in1    Recommendations for Other Services       Precautions / Restrictions Precautions Precautions: Fall Precaution Comments: monitor O2 Restrictions Weight Bearing Restrictions: No      Mobility  Bed Mobility Overal bed mobility: Needs Assistance Bed Mobility: Rolling;Sidelying to Sit;Sit to Sidelying Rolling: Min guard Sidelying to sit: Min guard     Sit to sidelying: Min assist General bed mobility comments: Assist for LEs. Increased time  Transfers Overall transfer level: Needs assistance Equipment used: Rolling walker (2 wheeled) Transfers: Sit to/from Stand Sit to Stand: Min assist;From elevated surface         General transfer comment: Assist to rise, stabilize, control descent. VCs safety, hand placement.   Ambulation/Gait Ambulation/Gait assistance: Min assist Ambulation Distance (Feet): 15 Feet Assistive device: Rolling walker (2 wheeled) Gait Pattern/deviations: Step-through pattern;Decreased stride length     General Gait Details: very  slow, effortful gait. Brief standing rest break taken midway. O2 sat 84% on RA, dyspnea 2/4.   Stairs            Wheelchair Mobility    Modified Rankin (Stroke Patients Only)       Balance Overall balance assessment: Needs assistance         Standing balance support: Bilateral upper extremity supported Standing balance-Leahy Scale: Poor                               Pertinent Vitals/Pain Pain Assessment: 0-10 Pain Score: 8  Pain Location: upper back/shoulder blades Pain Descriptors / Indicators: Sore;Aching;Grimacing;Guarding;Discomfort Pain Intervention(s): Limited activity within patient's tolerance;Repositioned;Monitored during session;Patient requesting pain meds-RN notified    Home Living Family/patient expects to be discharged to:: Private residence Living Arrangements: Non-relatives/Friends(ex wife) Available Help at Discharge: Friend(s);Available 24 hours/day Type of Home: Mobile home Home Access: Stairs to enter Entrance Stairs-Rails: Right;Left Entrance Stairs-Number of Steps: 5 Home Layout: One level Home Equipment: Walker - 2 wheels      Prior Function Level of Independence: Independent with assistive device(s);Needs assistance         Comments: Ex-wife prepares meals and provides transportation - patient does not drive     Hand Dominance        Extremity/Trunk Assessment   Upper Extremity Assessment Upper Extremity Assessment: RUE deficits/detail RUE Deficits / Details: limited use of R UE    Lower Extremity Assessment Lower Extremity Assessment: Generalized weakness    Cervical / Trunk Assessment Cervical / Trunk Assessment: Kyphotic  Communication   Communication: No difficulties  Cognition Arousal/Alertness: Awake/alert Behavior During Therapy: WFL for tasks assessed/performed Overall Cognitive Status: Within Functional Limits for tasks assessed                                        General  Comments      Exercises     Assessment/Plan    PT Assessment Patient needs continued PT services  PT Problem List Decreased balance;Decreased mobility;Pain;Decreased activity tolerance       PT Treatment Interventions DME instruction;Functional mobility training;Therapeutic activities;Gait training;Therapeutic exercise;Balance training;Patient/family education    PT Goals (Current goals can be found in the Care Plan section)  Acute Rehab PT Goals Patient Stated Goal: less pain PT Goal Formulation: With patient Time For Goal Achievement: 09/06/17 Potential to Achieve Goals: Fair    Frequency Min 3X/week   Barriers to discharge        Co-evaluation               AM-PAC PT "6 Clicks" Daily Activity  Outcome Measure Difficulty turning over in bed (including adjusting bedclothes, sheets and blankets)?: A Lot Difficulty moving from lying on back to sitting on the side of the bed? : A Lot Difficulty sitting down on and standing up from a chair with arms (e.g., wheelchair, bedside commode, etc,.)?: Unable Help needed moving to and from a bed to chair (including a wheelchair)?: A Little Help needed walking in hospital room?: A Little Help needed climbing 3-5 steps with a railing? : A Lot 6 Click Score: 13    End of Session Equipment Utilized During Treatment: Gait belt Activity Tolerance: Patient limited by fatigue;Patient limited by pain Patient left: in bed;with call bell/phone within reach;with bed alarm set   PT Visit Diagnosis: Muscle weakness (generalized) (M62.81);Difficulty in walking, not elsewhere classified (R26.2);Pain Pain - part of body: (upper back)    Time: 1610-9604 PT Time Calculation (min) (ACUTE ONLY): 22 min   Charges:   PT Evaluation $PT Eval Moderate Complexity: 1 Mod     PT G Codes:         Weston Anna, MPT Pager: [phone]

## 2017-08-23 NOTE — Progress Notes (Signed)
Daily Progress Note   Patient Name: Tanner [name]       Date: 08/23/2017 DOB: 11-30-1955  Age: 62 y.o. MRN#: [id number] Attending Physician: Elodia Florence., * Primary Care Physician: [name], NP Admit Date: 08/20/2017  Reason for Consultation/Follow-up: Pain control  Subjective: Tanner [name] is awake and eating breakfast this morning. Still having pain but moving around in bed with a little more ease than yesterday. Pain control is moving in the right direction.   Length of Stay: 2  Current Medications: Scheduled Meds:  . dexamethasone  8 mg Oral Q8H  . diclofenac sodium  2 g Topical QID  . docusate sodium  100 mg Oral BID  . feeding supplement (ENSURE ENLIVE)  237 mL Oral BID BM  . guaiFENesin  600 mg Oral BID  . ipratropium-albuterol  3 mL Nebulization TID  . lidocaine  1 patch Transdermal Q24H  . metoprolol tartrate  100 mg Oral BID  . morphine  45 mg Oral Q8H  . pantoprazole  40 mg Oral Daily  . polyethylene glycol  17 g Oral Daily  . pregabalin  75 mg Oral BID  . PRUTECT  1 application Topical BID  . senna  2 tablet Oral Daily  . simvastatin  20 mg Oral q1800  . sodium chloride flush  3 mL Intravenous Q12H  . thiamine  100 mg Oral Daily    Continuous Infusions: . sodium chloride      PRN Meds: sodium chloride, albuterol, diazepam, morphine injection, ondansetron, oxyCODONE, sodium chloride flush, traZODone  Physical Exam  Constitutional: He is oriented to person, place, and time. He appears well-developed. He appears ill.  HENT:  Head: Normocephalic and atraumatic.  Cardiovascular: Normal rate and regular rhythm.  Pulmonary/Chest: No accessory muscle usage. No tachypnea. No respiratory distress.  Oxygen needs decreasing  Abdominal: Normal appearance.   Neurological: He is alert and oriented to person, place, and time.  Nursing note and vitals reviewed.           Vital Signs: BP 121/76 (BP Location: Right Arm)   Pulse 76   Temp 97.9 F (36.6 C) (Oral)   Resp 18   Ht 5' 10"  (1.778 m)   Wt 68 kg (150 lb)   SpO2 98%   BMI 21.52 kg/m  SpO2: SpO2: 98 %  O2 Device: O2 Device: Nasal Cannula O2 Flow Rate: O2 Flow Rate (L/min): 1 L/min  Intake/output summary:   Intake/Output Summary (Last 24 hours) at 08/23/2017 0932 Last data filed at 08/23/2017 0507 Gross per 24 hour  Intake 720 ml  Output 1200 ml  Net -480 ml   LBM: Last BM Date: 08/15/17 Baseline Weight: Weight: 68 kg (150 lb) Most recent weight: Weight: 68 kg (150 lb)       Palliative Assessment/Data: 30%      Patient Active Problem List   Diagnosis Date Noted  . Cancer associated pain   . Palliative care encounter   . Hypoxia 08/20/2017  . Acute respiratory failure with hypoxia ([place]) 08/20/2017  . COPD (chronic obstructive pulmonary disease) ([place]) 08/20/2017  . Constipation 08/14/2017  . Malignant tumor, spindle cell type ([place]) 07/16/2017  . Goals of care, counseling/discussion 07/16/2017  . Chronic low back pain 07/04/2017  . Bone metastasis ([place]) 06/23/2017  . COPD with acute exacerbation ([place]) 05/15/2017  . Multiple lung nodules on CT   . Near syncope 05/03/2017  . Lung mass 05/03/2017  . Hyperlipidemia 09/20/2016  . Protein-calorie malnutrition ([place]) 08/17/2016  . Thoracic compression fracture, closed, initial encounter ([place]) 08/17/2016  . Alcohol dependence ([place]) 08/17/2016  . Tobacco dependence 08/17/2016  . Osteoporosis 08/17/2016  . Hip fracture ([place]) 08/16/2016  . Headache 02/24/2016  . Hypertensive urgency 02/03/2016  . Shortness of breath 07/28/2015  . Generalized anxiety disorder 07/28/2015  . Medicare annual wellness visit, subsequent 05/24/2015  . Acute DVT of right tibial vein ([place]) 02/27/2015  . Tibia fracture 02/20/2015  . Syncope  02/18/2015  . Essential hypertension 02/18/2015  . History of stroke 02/18/2015  . Fracture of tibia, right, closed   . C. difficile colitis 09/11/2013  . UTI (urinary tract infection) 09/11/2013  . Essential hypertension, benign 09/11/2013  . Ileus ([place]) 09/10/2013  . 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 Ileus, postoperative ([place]) 09/09/2013  . Hypokalemia 09/09/2013  . Nonspecific (abnormal) findings on radiological and other examination of gastrointestinal tract 09/09/2013  . CVA (cerebral infarction) 09/05/2013  . 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 Malignant neoplasm of prostate ([place]) 08/29/2013  . Hyponatremia 12/11/2012    Class: Chronic  . Ankle fracture 12/10/2012    Palliative Care Assessment & Plan   HPI: 62 y.o. 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 Palliative care requested for assistance in pain management.   Assessment: I met this morning with Tanner [name]. Woke up with pain this morning but at 5-6/10 and overall says pain is improved. However, this is also while he is just lying in bed and I assume any activity will increase his pain. Discussed plan to increase dose/frequency of OxyIR and to try utilizing this before the IV morphine. Use IV morphine only as rescue when OxyIR is ineffective. BP and mentation stable at this time. He says he is planning further chemo with Dr. Julien Nordmann on 09/06/17.   Recommendations/Plan:  Bone mets pain: Pain 0/10 last night and this morning up to 5-6/10 so much improved overall  Continue MS Contin 45 mg every 8 hours.   Increasing OxyIR to 15 mg every 3 hours prn (was 10 mg every 4 hours prn).  Morphine IV 4  mg  every 4 hours prn for severe breakthrough pain. Monitor BP closely. Discussed plan with RN. Lidoderm patch - patient willing to try again today but did not want Voltaren gel (d/c)   Continue decadron 8 mg po TID for now.   Continue Lyrica 75 mg po BID.   LBM: Pt reports last good BM ~1 week ago. Senokot 2 tabs daily. No BM yet but abd soft, +BS, no pain.   Will need to also assess pain control with any activity as he has mainly been lying in bed.    Goals of Care and Additional Recommendations:  Limitations on Scope of Treatment: Full Scope Treatment  Code Status:  DNR   Prognosis:   Extremely poor with progressing advanced cancer and worsening pain and functional status. Certainly eligible for hospice is forego further treatment.   Discharge Planning:  To Be Determined. Likely home with his ex-wife, home health, and outpatient palliative follow up.   Care plan was discussed with Dr. Florene Glen, RN  Thank you for allowing the Palliative Medicine Team to assist in the care of this patient.   Total Time 35 min Prolonged Time Billed  no       Greater than 50%  of this time was spent counseling and coordinating care related to the above assessment and plan.  Vinie Sill, NP Palliative Medicine Team Pager # [phone] (M-F 8a-5p) Team Phone # [phone] (Nights/Weekends)

## 2017-08-24 ENCOUNTER — Other Ambulatory Visit: Payer: Self-pay | Admitting: Oncology

## 2017-08-24 DIAGNOSIS — C7951 Secondary malignant neoplasm of bone: Principal | ICD-10-CM

## 2017-08-24 DIAGNOSIS — C801 Malignant (primary) neoplasm, unspecified: Secondary | ICD-10-CM

## 2017-08-24 DIAGNOSIS — Z9221 Personal history of antineoplastic chemotherapy: Secondary | ICD-10-CM

## 2017-08-24 DIAGNOSIS — K5900 Constipation, unspecified: Secondary | ICD-10-CM

## 2017-08-24 DIAGNOSIS — G893 Neoplasm related pain (acute) (chronic): Secondary | ICD-10-CM

## 2017-08-24 DIAGNOSIS — Z79899 Other long term (current) drug therapy: Secondary | ICD-10-CM

## 2017-08-24 DIAGNOSIS — C771 Secondary and unspecified malignant neoplasm of intrathoracic lymph nodes: Secondary | ICD-10-CM

## 2017-08-24 DIAGNOSIS — C61 Malignant neoplasm of prostate: Secondary | ICD-10-CM

## 2017-08-24 DIAGNOSIS — M549 Dorsalgia, unspecified: Secondary | ICD-10-CM

## 2017-08-24 DIAGNOSIS — E440 Moderate protein-calorie malnutrition: Secondary | ICD-10-CM

## 2017-08-24 DIAGNOSIS — R0602 Shortness of breath: Secondary | ICD-10-CM

## 2017-08-24 DIAGNOSIS — Z923 Personal history of irradiation: Secondary | ICD-10-CM

## 2017-08-24 LAB — CBC
HCT: 28.4 % — ABNORMAL LOW (ref 39.0–52.0)
Hemoglobin: 9.5 g/dL — ABNORMAL LOW (ref 13.0–17.0)
MCH: 32.9 pg (ref 26.0–34.0)
MCHC: 33.5 g/dL (ref 30.0–36.0)
MCV: 98.3 fL (ref 78.0–100.0)
PLATELETS: 260 10*3/uL (ref 150–400)
RBC: 2.89 MIL/uL — AB (ref 4.22–5.81)
RDW: 13.1 % (ref 11.5–15.5)
WBC: 7.5 10*3/uL (ref 4.0–10.5)

## 2017-08-24 LAB — BASIC METABOLIC PANEL
ANION GAP: 12 (ref 5–15)
BUN: 21 mg/dL — AB (ref 6–20)
CO2: 28 mmol/L (ref 22–32)
Calcium: 9.5 mg/dL (ref 8.9–10.3)
Chloride: 95 mmol/L — ABNORMAL LOW (ref 101–111)
Creatinine, Ser: 0.6 mg/dL — ABNORMAL LOW (ref 0.61–1.24)
GFR calc Af Amer: >60 mL/min (ref >60)
Glucose, Bld: 151 mg/dL — ABNORMAL HIGH (ref 65–99)
POTASSIUM: 4.5 mmol/L (ref 3.5–5.1)
Sodium: 135 mmol/L (ref 135–145)

## 2017-08-24 MED ORDER — ADULT MULTIVITAMIN W/MINERALS CH
1.0000 | ORAL_TABLET | Freq: Every day | ORAL | Status: DC
  Administered 2017-08-30 – 2017-09-04 (×5): 1 via ORAL
  Filled 2017-08-24 (×10): qty 1

## 2017-08-24 MED ORDER — MORPHINE SULFATE (PF) 2 MG/ML IV SOLN
2.0000 mg | INTRAVENOUS | Status: DC | PRN
Start: 2017-08-24 — End: 2017-08-25
  Administered 2017-08-24 (×3): 2 mg via INTRAVENOUS
  Filled 2017-08-24 (×3): qty 1

## 2017-08-24 MED ORDER — PREGABALIN 50 MG PO CAPS: 100.0000 mg | ORAL_CAPSULE | Freq: Two times a day (BID) | ORAL | Status: DC

## 2017-08-24 MED ORDER — METOPROLOL TARTRATE 25 MG PO TABS
50.0000 mg | ORAL_TABLET | Freq: Two times a day (BID) | ORAL | Status: DC
  Administered 2017-08-24 – 2017-08-25 (×3): 50 mg via ORAL
  Filled 2017-08-24 (×2): qty 1
  Filled 2017-08-24: qty 2

## 2017-08-24 MED ORDER — ENSURE ENLIVE PO LIQD
237.0000 mL | Freq: Three times a day (TID) | ORAL | Status: DC
  Administered 2017-08-24 – 2017-09-03 (×23): 237 mL via ORAL
  Filled 2017-08-24: qty 237

## 2017-08-24 MED ORDER — PREGABALIN 75 MG PO CAPS
150.0000 mg | ORAL_CAPSULE | Freq: Two times a day (BID) | ORAL | Status: DC
  Administered 2017-08-24 – 2017-09-04 (×22): 150 mg via ORAL
  Filled 2017-08-24 (×22): qty 2

## 2017-08-24 NOTE — NC FL2 (Signed)
[place] LEVEL OF CARE SCREENING TOOL     IDENTIFICATION  Patient Name: Tanner [name] Birthdate: [birth year] Sex: male Admission Date (Current Location): 08/20/2017  [facility] and [place] Number:  Herbalist and Address:  [facility],  [place], [place]      Provider Number: [id number]  Attending Physician Name and Address:  Elodia Florence., *  Relative Name and Phone Number:       Current Level of Care: Hospital Recommended Level of Care: [place] Prior Approval Number:    Date Approved/Denied:   PASRR Number:    Discharge Plan: SNF    Current Diagnoses: Patient Active Problem List   Diagnosis Date Noted  . Malnutrition of moderate degree 08/24/2017  . Cancer associated pain   . Palliative care encounter   . Hypoxia 08/20/2017  . Acute respiratory failure with hypoxia ([place]) 08/20/2017  . COPD (chronic obstructive pulmonary disease) ([place]) 08/20/2017  . Constipation 08/14/2017  . Malignant tumor, spindle cell type ([place]) 07/16/2017  . Goals of care, counseling/discussion 07/16/2017  . Chronic low back pain 07/04/2017  . Bone metastasis ([place]) 06/23/2017  . COPD with acute exacerbation ([place]) 05/15/2017  . Multiple lung nodules on CT   . Near syncope 05/03/2017  . Lung mass 05/03/2017  . Hyperlipidemia 09/20/2016  . Protein-calorie malnutrition ([place]) 08/17/2016  . Thoracic compression fracture, closed, initial encounter ([place]) 08/17/2016  . Alcohol dependence ([place]) 08/17/2016  . Tobacco dependence 08/17/2016  . Osteoporosis 08/17/2016  . Hip fracture ([place]) 08/16/2016  . Headache 02/24/2016  . Hypertensive urgency 02/03/2016  . Shortness of breath 07/28/2015  . Generalized anxiety disorder 07/28/2015  . Medicare annual wellness visit, subsequent 05/24/2015  . Acute DVT of right tibial vein ([place]) 02/27/2015  . Tibia fracture 02/20/2015  . Syncope 02/18/2015  . Essential hypertension  02/18/2015  . History of stroke 02/18/2015  . Fracture of tibia, right, closed   . C. difficile colitis 09/11/2013  . UTI (urinary tract infection) 09/11/2013  . Essential hypertension, benign 09/11/2013  . Ileus ([place]) 09/10/2013  . Spastic hemiplegia affecting dominant side ([place]) 09/09/2013  . Ileus, postoperative ([place]) 09/09/2013  . Hypokalemia 09/09/2013  . Nonspecific (abnormal) findings on radiological and other examination of gastrointestinal tract 09/09/2013  . CVA (cerebral infarction) 09/05/2013  . 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 Malignant neoplasm of prostate ([place]) 08/29/2013  . Hyponatremia 12/11/2012    Class: Chronic  . Ankle fracture 12/10/2012    Orientation RESPIRATION BLADDER Height & Weight     Self, Situation, Place, Time  O2(1L) Continent Weight: 148 lb 2.4 oz (67.2 kg) Height:  5\' 10"  (177.8 cm)  BEHAVIORAL SYMPTOMS/MOOD NEUROLOGICAL BOWEL NUTRITION STATUS      Continent Diet(regular diet)  AMBULATORY STATUS COMMUNICATION OF NEEDS Skin   Limited Assist Verbally Normal                       Personal Care Assistance Level of Assistance  Bathing, Feeding, Dressing Bathing Assistance: Limited assistance Feeding assistance: Independent Dressing Assistance: Limited assistance     Functional Limitations Info  Sight, Hearing, Speech Sight Info: Adequate Hearing Info: Adequate Speech Info: Adequate    SPECIAL CARE FACTORS FREQUENCY  PT (By licensed PT), OT (By licensed OT)     PT Frequency: 5x OT Frequency: 5x            Contractures Contractures Info: Not present    Additional Factors  Info  Code Status, Allergies Code Status Info: DNR Allergies Info: Dilaudid Hydromorphone Hcl, Procaine Hcl, Sulfa Antibiotics           Current Medications (08/24/2017):  This is the current hospital active medication list Current Facility-Administered Medications  Medication Dose Route Frequency Provider Last Rate Last Dose  . 0.9 %   sodium chloride infusion  250 mL Intravenous PRN Derrill Kay A, MD      . albuterol (PROVENTIL) (2.5 MG/3ML) 0.083% nebulizer solution 2.5 mg  2.5 mg Nebulization Q2H PRN Phillips Grout, MD      . bisacodyl (DULCOLAX) suppository 10 mg  10 mg Rectal Daily PRN Pershing Proud, NP      . dexamethasone (DECADRON) tablet 8 mg  8 mg Oral U4Q Vinie Sill C, NP   8 mg at 08/24/17 1455  . diazepam (VALIUM) tablet 5 mg  5 mg Oral BID PRN Phillips Grout, MD   5 mg at 08/22/17 0410  . docusate sodium (COLACE) capsule 100 mg  100 mg Oral BID Phillips Grout, MD   100 mg at 08/24/17 0911  . feeding supplement (ENSURE ENLIVE) (ENSURE ENLIVE) liquid 237 mL  237 mL Oral TID BM Elodia Florence., MD      . guaiFENesin [facility]) 12 hr tablet 600 mg  600 mg Oral BID Derrill Kay A, MD   600 mg at 08/24/17 0912  . ipratropium-albuterol (DUONEB) 0.5-2.5 (3) MG/3ML nebulizer solution 3 mL  3 mL Nebulization TID Caren Griffins, MD   3 mL at 08/24/17 1427  . lidocaine (LIDODERM) 5 % 1 patch  1 patch Transdermal Q24H Elodia Florence., MD   1 patch at 08/24/17 [phone]  . metoprolol tartrate (LOPRESSOR) tablet 50 mg  50 mg Oral BID Elodia Florence., MD   50 mg at 08/24/17 0911  . morphine (MS CONTIN) 12 hr tablet 45 mg  45 mg Oral Q2V Vinie Sill C, NP   45 mg at 08/24/17 1455  . morphine 2 MG/ML injection 2 mg  2 mg Intravenous Z5G PRN Pershing Proud, NP   2 mg at 08/24/17 1210  . multivitamin with minerals tablet 1 tablet  1 tablet Oral Daily Elodia Florence., MD      . ondansetron [facility]) tablet 8 mg  8 mg Oral Q8H PRN Derrill Kay A, MD      . oxyCODONE (Oxy IR/ROXICODONE) immediate release tablet 15 mg  15 mg Oral L8V PRN Pershing Proud, NP   15 mg at 08/24/17 1055  . pantoprazole (PROTONIX) EC tablet 40 mg  40 mg Oral Daily Derrill Kay A, MD   40 mg at 08/24/17 0911  . polyethylene glycol (MIRALAX / GLYCOLAX) packet 17 g  17 g Oral Daily Derrill Kay A, MD   17 g at 08/24/17  0911  . pregabalin (LYRICA) capsule 150 mg  150 mg Oral BID Pershing Proud, NP      . PRUTECT emulsion 1 application  1 application Topical BID Phillips Grout, MD   1 application at 56/43/32 0913  . senna (SENOKOT) tablet 17.2 mg  2 tablet Oral Daily Vinie Sill C, NP   95.1 mg at 08/24/17 0912  . simvastatin (ZOCOR) tablet 20 mg  20 mg Oral q1800 Caren Griffins, MD   20 mg at 08/23/17 1703  . sodium chloride flush (NS) 0.9 % injection 3 mL  3 mL Intravenous Q12H Phillips Grout, MD  3 mL at 08/24/17 0913  . sodium chloride flush (NS) 0.9 % injection 3 mL  3 mL Intravenous PRN Derrill Kay A, MD      . thiamine (VITAMIN B-1) tablet 100 mg  100 mg Oral Daily Derrill Kay A, MD   100 mg at 08/24/17 0912  . traZODone (DESYREL) tablet 50 mg  50 mg Oral QHS PRN Phillips Grout, MD   50 mg at 08/23/17 2127     Discharge Medications: Please see discharge summary for a list of discharge medications.  Relevant Imaging Results:  Relevant Lab Results:   Additional Information SS# [id number]  Nila Nephew, LCSW

## 2017-08-24 NOTE — Progress Notes (Signed)
Subjective: The patient is seen and examined today.  His daughter was at the bedside.  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  The patient was a started on treatment with Doxil status post 1 cycle.  He was expected to start cycle #2 on August 27, 2017.  The patient also underwent palliative radiotherapy to several metastatic bone disease.  He was recently admitted to [facility] with shortness of breath as well as management of his pain issues.  He also has constipation for several days.  The patient is currently on pain management in the form of MS Contin 45 mg every 12 hours in addition to OxyIR 15 mg every 3 hours as needed.  He is also on Lyrica and Decadron.  His pain is better but not completely controlled.  He denied having any current fever or chills.  He has no nausea or vomiting but he has constipation for more than a week.  Objective: Vital signs in last 24 hours: Temp:  [97.8 F (36.6 C)-98.9 F (37.2 C)] 98.9 F (37.2 C) (02/15 0440) Pulse Rate:  [74-87] 83 (02/15 0440) Resp:  [18-20] 18 (02/15 0440) BP: (95-112)/(66-78) 95/71 (02/15 0440) SpO2:  [91 %-98 %] 91 % (02/15 0440)  Intake/Output from previous day: 02/14 0701 - 02/15 0700 In: 600 [P.O.:600] Out: 1100 [Urine:1100] Intake/Output this shift: Total I/O In: -  Out: 200 [Urine:200]  General appearance: alert, cooperative, fatigued and mild distress Resp: clear to auscultation bilaterally Cardio: regular rate and rhythm, S1, S2 normal, no murmur, click, rub or gallop GI: soft, non-tender; bowel sounds normal; no masses,  no organomegaly Extremities: extremities normal, atraumatic, no cyanosis or edema  Lab Results:  Recent Labs    08/23/17 0439 08/24/17 0439  WBC 5.4 7.5  HGB 9.1* 9.5*  HCT 27.6* 28.4*  PLT 273 260   BMET Recent Labs    08/23/17 0439 08/24/17 0439  NA 136 135  K 4.7 4.5  CL 94* 95*  CO2 30  28  GLUCOSE 136* 151*  BUN 13 21*  CREATININE 0.60* 0.60*  CALCIUM 9.5 9.5    Studies/Results: No results found.  Medications: I have reviewed the patient's current medications.   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 He underwent palliative radiotherapy to multiple areas in the pelvis and back. The patient continues to have significant pain from his bone disease his pain medication has been adjusted to.  MS Contin 45 mg every 8 hours in addition to OxyIR 15 mg every 3 hours in addition to Lyrica and Decadron.  We will continue to monitor his pain with the current medication and adjust if needed. We also need to discuss with radiation oncology the possibility of additional radiotherapy to the painful lesions. The patient may also benefit from evaluation by the palliative care team to assist with his management. Regarding his chemotherapy, he was a scheduled to start cycle #2 on August 27, 2017.  I will hold his treatment for now until improvement of his condition. For the prolonged constipation, the patient tried several medications in the past including stool softener, MiraLAX, magnesium citrate with no improvement.  May consider him for milk and molasses enema or lactulose treatment. Thank you so much for taking good care of Mr. Laurance Flatten, I will tell 12 patient request in his management on as-needed basis.  LOS: 3 days    Eilleen Kempf 08/24/2017

## 2017-08-24 NOTE — Evaluation (Signed)
Occupational Therapy Evaluation Patient Details Name: Tanner [name] MRN: [id number] DOB: [birth year] Today's Date: 08/24/2017    History of Present Illness 62 yo male admitted with acute respiratory failure, pain issues. Hx of ETOH abuse, prostate cancer, stage IV spindle cell cancer, bony mets, rib fractures, Hep, DVT, CVA, HTN   Clinical Impression   Pt was admitted for the above. He is very limited with adls due to pain and weakness.  Pt performed ADL from sitting to lying.  He was unable to stand during this session. Will continue to follow him in acute setting. Anticipate he will need SNF for rehab.    Follow Up Recommendations  SNF    Equipment Recommendations  3 in 1 bedside commode(if pt doesn't have)    Recommendations for Other Services       Precautions / Restrictions Precautions Precautions: Fall Precaution Comments: monitor O2 Restrictions Weight Bearing Restrictions: No      Mobility Bed Mobility   Bed Mobility: Rolling;Sidelying to Sit;Sit to Sidelying Rolling: Min guard Sidelying to sit: Min guard     Sit to sidelying: Min guard General bed mobility comments: min A to initiate movement for rolling  Transfers Overall transfer level: Needs assistance Equipment used: Rolling walker (2 wheeled) Transfers: Sit to/from Stand Sit to Stand: Min assist;From elevated surface         General transfer comment: not attempted due to pain    Balance Overall balance assessment: Needs assistance   Sitting balance-Leahy Scale: Good     Standing balance support: Bilateral upper extremity supported Standing balance-Leahy Scale: Poor                             ADL either performed or assessed with clinical judgement   ADL Overall ADL's : Needs assistance/impaired     Grooming: Wash/dry hands;Wash/dry face;Set up;Sitting   Upper Body Bathing: Moderate assistance;Sitting   Lower Body Bathing: Moderate assistance;Bed level   Upper Body  Dressing : Maximal assistance;Sitting                     General ADL Comments: performed ADL from sitting EOB to lying. Pt laid down twice during UB adl then completed peri care from lying due to pain.  Needs ADL assistance from both pain and decreased strength/endurance     Vision         Perception     Praxis      Pertinent Vitals/Pain Pain Score: 8  Pain Location: B shoulders & scapulae; Low back in standing Pain Descriptors / Indicators: Sore;Aching;Grimacing;Guarding;Discomfort Pain Intervention(s): Limited activity within patient's tolerance;Monitored during session;Premedicated before session;Repositioned     Hand Dominance     Extremity/Trunk Assessment Upper Extremity Assessment Upper Extremity Assessment: Generalized weakness BUE Deficits / Details: difficulty using RUE to wash under L; LUE is painful but he was able to wash under R with it.  RUE more affected from CVA. Did not fully assess ROM after ADL due to pain           Communication Communication Communication: No difficulties   Cognition Arousal/Alertness: Awake/alert Behavior During Therapy: WFL for tasks assessed/performed Overall Cognitive Status: Within Functional Limits for tasks assessed                                     General Comments  Exercises     Shoulder Instructions      Home Living Family/patient expects to be discharged to:: Private residence Living Arrangements: Non-relatives/Friends Available Help at Discharge: Friend(s);Available 24 hours/day               Bathroom Shower/Tub: Occupational psychologist: Standard     Home Equipment: Environmental consultant - 2 wheels          Prior Functioning/Environment Level of Independence: Independent with assistive device(s);Needs assistance        Comments: Ex-wife prepares meals and provides transportation - patient does not drive        OT Problem List: Pain;Decreased knowledge of use of  DME or AE;Decreased strength;Decreased activity tolerance;Impaired UE functional use      OT Treatment/Interventions: Self-care/ADL training;DME and/or AE instruction;Patient/family education;Therapeutic activities;Energy conservation    OT Goals(Current goals can be found in the care plan section) Acute Rehab OT Goals Patient Stated Goal: less pain, meet first grandchild (due in April) OT Goal Formulation: With patient Time For Goal Achievement: 09/07/17 Potential to Achieve Goals: Fair ADL Goals Pt Will Transfer to Toilet: with min assist;stand pivot transfer;bedside commode;squat pivot transfer Additional ADL Goal #1: pt will perform ADL with min A, AE as needed sitting for part of ADL then lying vs standing depending upon tolerance  OT Frequency: Min 2X/week   Barriers to D/C:            Co-evaluation              AM-PAC PT "6 Clicks" Daily Activity     Outcome Measure Help from another person eating meals?: A Little Help from another person taking care of personal grooming?: A Little Help from another person toileting, which includes using toliet, bedpan, or urinal?: A Lot Help from another person bathing (including washing, rinsing, drying)?: A Lot Help from another person to put on and taking off regular upper body clothing?: A Lot Help from another person to put on and taking off regular lower body clothing?: A Lot 6 Click Score: 14   End of Session    Activity Tolerance: Patient limited by fatigue;Patient limited by pain Patient left: in bed;with call bell/phone within reach;with bed alarm set;with family/visitor present  OT Visit Diagnosis: Pain;Muscle weakness (generalized) (M62.81) Pain - Right/Left: (bil ) Pain - part of body: Arm                Time: 6789-3810 OT Time Calculation (min): 30 min Charges:  OT General Charges $OT Visit: 1 Visit OT Evaluation $OT Eval Low Complexity: 1 Low OT Treatments $Self Care/Home Management : 8-22 mins G-Codes:      [place], OTR/L [phone] 08/24/2017  Darrol Brandenburg 08/24/2017, 2:09 PM

## 2017-08-24 NOTE — Progress Notes (Signed)
Initial Nutrition Assessment  DOCUMENTATION CODES:   Non-severe (moderate) malnutrition in context of acute illness/injury  INTERVENTION:  - Will increase Ensure Enlive to TID, each supplement provides 350 kcal and 20 grams of protein. - Will order daily multivitamin with minerals. - Diet liberalized from Heart Healthy to Regular. - Continue to encourage PO intakes.   NUTRITION DIAGNOSIS:   Moderate Malnutrition related to acute illness, catabolic illness, cancer and cancer related treatments as evidenced by percent weight loss, energy intake < 75% for > 7 days, mild fat depletion.  GOAL:   Patient will meet greater than or equal to 90% of their needs  MONITOR:   PO intake, Supplement acceptance, Weight trends, Labs  REASON FOR ASSESSMENT:   Malnutrition Screening Tool  ASSESSMENT:   62 y.o. 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  Family reports that he has pain is been difficult to control at home he is got multiple bony lytic lesions with rib fractures.  They just adjusted his pain medications last week.  He has been short of breath at home and coughing.  BMI indicates normal weight. Spoke with pt and daughter, who was at bedside. Pt reports very poor appetite since cancer dx around Christmas time. He states that since starting chemo he has been experiencing taste changes; he is unable to explain how taste has changed other than that things taste "bad." Pt states that due to lack of appetite and taste changes, he has decreased desire to eat. He states that dinner last night was a few pieces of angel food cake and a bottle of Ensure.  He was drinking Ensure and Equate brand protein shakes 1-2 times/day PTA but recently stopped due to price. Provided pt with several Ensure coupons. He has still enjoyed the flavor of these shakes. He denies  any abdominal pain or nausea other than some intermittent nausea around the time of chemo. He denies any pain with chewing or swallowing. Sometimes he feels as though things are going to get stuck in his throat; this fear limits his intakes. He reports previous sensitivity to cold drinks but that this has improved; he is unsure of hot sensitivity.   Physical assessment outlined below. Pt and daughter report that he weighed 163-165 lbs at the time of cancer diagnosis and that he last weighed 153 lbs two weeks PTA. This indicates ~9% body weight loss in the past 2 months which is significant for time frame.  Medications reviewed; 100 mg Colace BID, 1 packet Miralax/day, 2 tablets Senokot/day, 100 mg oral thiamine/day. Labs reviewed; Cl: 95 mmol/L, creatinine: 0.6 mg/dL.      NUTRITION - FOCUSED PHYSICAL EXAM:    Diet Order:  Diet regular Room service appropriate? Yes; Fluid consistency: Thin  EDUCATION NEEDS:   No education needs have been identified at this time  Skin:  Skin Assessment: Reviewed RN Assessment  Last BM:  PTA/unknown  Height:   Ht Readings from Last 1 Encounters:  08/20/17 5\' 10"  (1.778 m)    Weight:   Wt Readings from Last 1 Encounters:  08/20/17 150 lb (68 kg)    Ideal Body Weight:  75.45 kg  BMI:  Body mass index is 21.52 kg/m.  Estimated Nutritional Needs:   Kcal:  2952-8413 (33-37 kcal/kg)  Protein:  115-128 grams (2-2.2 grams/kg)  Fluid:  >/= 2.2 L/day     Jarome Matin, MS,  RD, LDN, CNSC Inpatient Clinical Dietitian Pager # [phone] After hours/weekend pager # [phone]

## 2017-08-24 NOTE — Clinical Social Work Note (Signed)
Clinical Social Work Assessment  Patient Details  Name: Tanner [name] MRN: [id number] Date of Birth: [birth year]  Date of referral:  08/24/17               Reason for consult:  Discharge Planning                Permission sought to share information with:  Family Supports Permission granted to share information::  Yes, Verbal Permission Granted  Name::     daughter Tanner [name]  Agency::     Relationship::     Contact Information:     Housing/Transportation Living arrangements for the past 2 months:  Single Family Home Source of Information:  Patient, Adult Children, Medical Team Patient Interpreter Needed:  None Criminal Activity/Legal Involvement Pertinent to Current Situation/Hospitalization:  No - Comment as needed Significant Relationships:  Adult Children, Significant Other Lives with:  Significant Other Do you feel safe going back to the place where you live?  Yes Need for family participation in patient care:  Yes (Comment)(daughter helping with decisions)  Care giving concerns: Pt resides at home with his ex wife. Daughter lives nearby and is supportive. Prior to illness pt states he was completely independent. Had been becoming weaker and "almost tripped on the stairs" per daughter.  Pt explains he has gotten palliative radiation and now palliative chemotherapy (states it is every 3rd Monday). Pt has walker, BC at home and PTA was hoping to get a hospital bed and lift chair.    Social Worker assessment / plan:  CSW consulted to assist with disposition plan.  Met with pt and daughter at bedside, both engaged and welcoming. Gathered description of care needs above. Both state they are trying to work out best feasible plan for pt post-DC. Are conflicted about what it would mean to go to rehab while also pursuing palliative chemotherapy. CSW explained this may be large barrier to admitting to SNF if attempting to use Medicare benefit. Both daughter and pt very understanding.  Pt  explains that he lives with his exwife who "takes care of me but has health problems herself as well." He and daughter report his wish would be to return home from hospital but that the barriers he sees are A) he cannot manage the steps going in and out of his house currently- daughter reports potential solution to this is her fiance is working to build a ramp, and pt hoping to work with PT in order to get ambulation ability back, B) he struggles to get out of his bed at home without assistance- reports potential solution to this would be if insurance would approve a hospital bed and/or lift chair at home (States they had just begun to look into it PTA).  CSW discussed the SNF placement process and need for pt to have insurance pre-authorization. Discussed possibility also that insurance denies.   CSW, pt, and daughter discussed scenarios of pt's care extensively.  Current plan: refer to SNF, and once bed selected, seek insurance auth for short term rehab. If unable to secure SNF bed under Medicare benefit prior to pt being stable for DC, then pt would like to put plan in place to return directly home.    Employment status:  Disabled (Comment on whether or not currently receiving Disability)(receives disability) Insurance information:  Programmer, applications PT Recommendations:  [place] / Referral to community resources:  [place]  Patient/Family's Response to care:  Appreciative and engaged  Patient/Family's Understanding of and Emotional  Response to Diagnosis, Current Treatment, and Prognosis:  Both pt and daughter demonstrate good understanding of current treatment but report needing some clarification on treatment plan going forward.  Emotionally pt/daughter seem to be processing pt's illness. States, "My daughter just found out she is pregnant and then I find out I have cancer. It's a lot of mixed emotions for our family."  Emotional  Assessment Appearance:  Appears stated age Attitude/Demeanor/Rapport:  Engaged Affect (typically observed):  Calm, Quiet Orientation:  Oriented to Self, Oriented to Place, Oriented to  Time, Oriented to Situation Alcohol / Substance use:  Not Applicable Psych involvement (Current and /or in the community):  No (Comment)  Discharge Needs  Concerns to be addressed:  Decision making concerns, Discharge Planning Concerns Readmission within the last 30 days:  No Current discharge risk:  (still assessing) Barriers to Discharge:  Continued Medical Work up   [employer], LCSW 08/24/2017, 4:23 PM  [phone]

## 2017-08-24 NOTE — Progress Notes (Signed)
PROGRESS NOTE    Tanner [name]  XAJ:[id number] DOB: [birth year] DOA: 08/20/2017 PCP: Tanner Sell, NP   Brief Narrative:  Tanner [name] 62 y.o.malewith medical history significant ofrecently diagnosed stage IV cancer of unknown primary distorted chemo 3 weeks ago for palliative treatment comes in with shortness of breath. Family reports that he has pain is been difficult to control at home he is got multiple bony lytic lesions with rib fractures. They just adjusted his pain medications last week. He has been short of breath at home and coughing.  Assessment & Plan:   Principal Problem:   Acute respiratory failure with hypoxia (HCC) Active Problems:   Essential hypertension, benign   Generalized anxiety disorder   Bone metastasis (HCC)   Malignant tumor, spindle cell type (HCC)   Hypoxia   COPD (chronic obstructive pulmonary disease) (HCC)   Cancer associated pain   Palliative care encounter   Metastatic spindle cell cancer -Stage IV, poorly differentiated spindle cell neoplasm, status post palliative radiotherapy, currently on systemic chemotherapy - Seems to be doing better with pain.  Notes ok when lying, but still significant with movement.    - Appreciate palliative assistance (MS contin 45 mg q8 hrs, oxycodone 15 mg q4, morphine 4 mg q4 prn breakthrough) - lyrica as well as decadron 8 po TID   - voltaren (d'c'd), lidocaine patch added for pain as well -Consulted palliative care for pain control, appreciate input - patient is now DNR - PT to eval, currently note home health vs SNF pending progress  Acute respiratory failure with hypoxia - Seems to be improving, wean O2 as tolerated (on 1 L this morning) - will likely need O2 at discharge - CT scan without PE, notable for metastatic disease and bony mets and pathologic rib fractures - has recent echo that showed normal EF, grade 1 diastolic dysfunction, does not appear overloaded on exam -He has Tanner [name] history of  COPD, some scattered wheezing today, continue nebs and receiving steroids as above -Suspect multifactorial due to poor inspiratory effort in the setting of multiple rib metastasis COPD -Pain control, supplemental oxygen  COPD -continue duo nebs as needed -Possibly he has Tanner [name] degree of chronic respiratory failure  Hypertension -Controlled, decrease metoprolol to 50 BID, hold amlodipine.  Had some soft BP's yesterday which limited pain meds.  Continue to monitor closely.   R Shoulder Pain - negative plain films, continue to monitor  Hyperlipidemia -Continue statin  Anemia  Leukopenia: likely related to malignancy and chemotherapy, will continue to monitor  DVT prophylaxis: SCD Code Status: DNR Family Communication: none at bedside, daugther by phone 2/14 Disposition Plan: pending   Consultants:   Palliative care  Procedures:   none  Antimicrobials:   none    Subjective: Still with significant pain with movement. Breathing. Shoulders and back mostly.  Objective: Vitals:   08/23/17 1956 08/23/17 2126 08/24/17 0020 08/24/17 0440  BP:  112/71 105/78 95/71  Pulse:  87 81 83  Resp:   20 18  Temp:   98 F (36.7 C) 98.9 F (37.2 C)  TempSrc:   Oral Oral  SpO2: 93%  93% 91%  Weight:      Height:        Intake/Output Summary (Last 24 hours) at 08/24/2017 0832 Last data filed at 08/24/2017 0700 Gross per 24 hour  Intake 600 ml  Output 1100 ml  Net -500 ml   Filed Weights   08/20/17 1824  Weight: 68 kg (150 lb)  Examination:  General: No acute distress.  Moves gingerly in bed Cardiovascular: Heart sounds show Tanner [name] regular rate, and rhythm. No gallops or rubs. No murmurs. No JVD. Lungs: Clear to auscultation bilaterally with good air movement. No rales, rhonchi or wheezes.  Tanner [name] in place. Abdomen: Soft, nontender, nondistended with normal active bowel sounds. No masses. No hepatosplenomegaly. Neurological: Alert and oriented 3. Moves all extremities 4.  Cranial nerves II through XII grossly intact. Skin: Warm and dry. No rashes or lesions. Extremities: No clubbing or cyanosis. No edema.  Psychiatric: Mood and affect are normal. Insight and judgment are appropriate.   Data Reviewed: I have personally reviewed following labs and imaging studies  CBC: Recent Labs  Lab 08/20/17 1851 08/21/17 0450 08/22/17 0407 08/23/17 0439 08/24/17 0439  WBC 2.2* 2.4* 2.5* 5.4 7.5  NEUTROABS 1.2*  --   --   --   --   HGB 8.7* 8.1* 10.5* 9.1* 9.5*  HCT 25.2* 24.1* 31.4* 27.6* 28.4*  MCV 97.7 98.8 98.4 98.2 98.3  PLT 214 196 222 273 951   Basic Metabolic Panel: Recent Labs  Lab 08/20/17 1851 08/21/17 0450 08/22/17 0407 08/23/17 0439 08/24/17 0439  NA 134* 134* 133* 136 135  K 4.0 4.0 4.6 4.7 4.5  CL 96* 98* 95* 94* 95*  CO2 26 27 27 30 28   GLUCOSE 94 89 134* 136* 151*  BUN 11 9 8 13  21*  CREATININE 0.76 0.70 0.67 0.60* 0.60*  CALCIUM 9.2 8.7* 9.8 9.5 9.5   GFR: Estimated Creatinine Clearance: 95.6 mL/min (Tanner [name]) (by C-G formula based on SCr of 0.6 mg/dL (L)). Liver Function Tests: Recent Labs  Lab 08/20/17 1851 08/23/17 0439  AST 21 17  ALT 13* 11*  ALKPHOS 94 87  BILITOT 0.2* 0.2*  PROT 6.4* 6.7  ALBUMIN 2.4* 2.4*   No results for input(s): LIPASE, AMYLASE in the last 168 hours. No results for input(s): AMMONIA in the last 168 hours. Coagulation Profile: No results for input(s): INR, PROTIME in the last 168 hours. Cardiac Enzymes: No results for input(s): CKTOTAL, CKMB, CKMBINDEX, TROPONINI in the last 168 hours. BNP (last 3 results) No results for input(s): PROBNP in the last 8760 hours. HbA1C: No results for input(s): HGBA1C in the last 72 hours. CBG: No results for input(s): GLUCAP in the last 168 hours. Lipid Profile: No results for input(s): CHOL, HDL, LDLCALC, TRIG, CHOLHDL, LDLDIRECT in the last 72 hours. Thyroid Function Tests: No results for input(s): TSH, T4TOTAL, FREET4, T3FREE, THYROIDAB in the last 72  hours. Anemia Panel: No results for input(s): VITAMINB12, FOLATE, FERRITIN, TIBC, IRON, RETICCTPCT in the last 72 hours. Sepsis Labs: Recent Labs  Lab 08/20/17 2309  LATICACIDVEN 0.39*    No results found for this or any previous visit (from the past 240 hour(s)).       Radiology Studies: Dg Shoulder Right  Result Date: 08/22/2017 CLINICAL DATA:  Chronic shoulder pain EXAM: RIGHT SHOULDER - 2+ VIEW COMPARISON:  None. FINDINGS: There is no evidence of fracture or dislocation. There is generalized osteopenia. There is no evidence of arthropathy or other focal bone abnormality. Soft tissues are unremarkable. IMPRESSION: No acute osseous injury of the right shoulder. Electronically Signed   By: Kathreen Devoid   On: 08/22/2017 10:11        Scheduled Meds: . dexamethasone  8 mg Oral Q8H  . docusate sodium  100 mg Oral BID  . feeding supplement (ENSURE ENLIVE)  237 mL Oral BID BM  . guaiFENesin  600  mg Oral BID  . ipratropium-albuterol  3 mL Nebulization TID  . lidocaine  1 patch Transdermal Q24H  . metoprolol tartrate  100 mg Oral BID  . morphine  45 mg Oral Q8H  . pantoprazole  40 mg Oral Daily  . polyethylene glycol  17 g Oral Daily  . pregabalin  75 mg Oral BID  . PRUTECT  1 application Topical BID  . senna  2 tablet Oral Daily  . simvastatin  20 mg Oral q1800  . sodium chloride flush  3 mL Intravenous Q12H  . thiamine  100 mg Oral Daily   Continuous Infusions: . sodium chloride       LOS: 3 days    Time spent: over 30 min    Fayrene Helper, MD [facility] Pager [phone]  If 7PM-7AM, please contact night-coverage [email or web address] Password [facility] 08/24/2017, 8:32 AM

## 2017-08-24 NOTE — Progress Notes (Addendum)
Daily Progress Note   Patient Name: Tanner [name]       Date: 08/24/2017 DOB: 12/21/1955  Age: 62 y.o. MRN#: [id number] Attending Physician: Elodia Florence., * Primary Care Physician: [name], NP Admit Date: 08/20/2017  Reason for Consultation/Follow-up: Pain control  Subjective: Tanner [name] is still complaining of pain now radiating from his shoulder blades into lower back.   Length of Stay: 3  Current Medications: Scheduled Meds:  . dexamethasone  8 mg Oral Q8H  . docusate sodium  100 mg Oral BID  . feeding supplement (ENSURE ENLIVE)  237 mL Oral BID BM  . guaiFENesin  600 mg Oral BID  . ipratropium-albuterol  3 mL Nebulization TID  . lidocaine  1 patch Transdermal Q24H  . metoprolol tartrate  50 mg Oral BID  . morphine  45 mg Oral Q8H  . pantoprazole  40 mg Oral Daily  . polyethylene glycol  17 g Oral Daily  . pregabalin  75 mg Oral BID  . PRUTECT  1 application Topical BID  . senna  2 tablet Oral Daily  . simvastatin  20 mg Oral q1800  . sodium chloride flush  3 mL Intravenous Q12H  . thiamine  100 mg Oral Daily    Continuous Infusions: . sodium chloride      PRN Meds: sodium chloride, albuterol, bisacodyl, diazepam, morphine injection, ondansetron, oxyCODONE, sodium chloride flush, traZODone  Physical Exam  Constitutional: He is oriented to person, place, and time. He appears well-developed. He appears ill.  HENT:  Head: Normocephalic and atraumatic.  Cardiovascular: Normal rate and regular rhythm.  Pulmonary/Chest: No accessory muscle usage. No tachypnea. No respiratory distress.  Oxygen needs decreasing  Abdominal: Normal appearance.  Neurological: He is alert and oriented to person, place, and time.  Nursing note and vitals reviewed.            Vital Signs: BP 95/71 (BP Location: Left Arm)   Pulse 83   Temp 98.9 F (37.2 C) (Oral)   Resp 18   Ht 5' 10"  (1.778 m)   Wt 68 kg (150 lb)   SpO2 91%   BMI 21.52 kg/m  SpO2: SpO2: 91 % O2 Device: O2 Device: Nasal Cannula O2 Flow Rate: O2 Flow Rate (L/min): 1 L/min  Intake/output summary:   Intake/Output Summary (Last 24  hours) at 08/24/2017 1210 Last data filed at 08/24/2017 1057 Gross per 24 hour  Intake 360 ml  Output 1300 ml  Net -940 ml   LBM: Last BM Date: 08/15/17 Baseline Weight: Weight: 68 kg (150 lb) Most recent weight: Weight: 68 kg (150 lb)       Palliative Assessment/Data: 30%      Patient Active Problem List   Diagnosis Date Noted  . Cancer associated pain   . Palliative care encounter   . Hypoxia 08/20/2017  . Acute respiratory failure with hypoxia ([place]) 08/20/2017  . COPD (chronic obstructive pulmonary disease) ([place]) 08/20/2017  . Constipation 08/14/2017  . Malignant tumor, spindle cell type ([place]) 07/16/2017  . Goals of care, counseling/discussion 07/16/2017  . Chronic low back pain 07/04/2017  . Bone metastasis ([place]) 06/23/2017  . COPD with acute exacerbation ([place]) 05/15/2017  . Multiple lung nodules on CT   . Near syncope 05/03/2017  . Lung mass 05/03/2017  . Hyperlipidemia 09/20/2016  . Protein-calorie malnutrition ([place]) 08/17/2016  . Thoracic compression fracture, closed, initial encounter ([place]) 08/17/2016  . Alcohol dependence ([place]) 08/17/2016  . Tobacco dependence 08/17/2016  . Osteoporosis 08/17/2016  . Hip fracture ([place]) 08/16/2016  . Headache 02/24/2016  . Hypertensive urgency 02/03/2016  . Shortness of breath 07/28/2015  . Generalized anxiety disorder 07/28/2015  . Medicare annual wellness visit, subsequent 05/24/2015  . Acute DVT of right tibial vein ([place]) 02/27/2015  . Tibia fracture 02/20/2015  . Syncope 02/18/2015  . Essential hypertension 02/18/2015  . History of stroke 02/18/2015  . Fracture of tibia, right, closed    . C. difficile colitis 09/11/2013  . UTI (urinary tract infection) 09/11/2013  . Essential hypertension, benign 09/11/2013  . Ileus ([place]) 09/10/2013  . 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 Ileus, postoperative ([place]) 09/09/2013  . Hypokalemia 09/09/2013  . Nonspecific (abnormal) findings on radiological and other examination of gastrointestinal tract 09/09/2013  . CVA (cerebral infarction) 09/05/2013  . 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 Malignant neoplasm of prostate ([place]) 08/29/2013  . Hyponatremia 12/11/2012    Class: Chronic  . Ankle fracture 12/10/2012    Palliative Care Assessment & Plan   HPI: 62 y.o. 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 Palliative care requested for assistance in pain management.   Assessment: I met today with Tanner [name] along with his daughter at bedside. We discussed his pain as he is comfortable at rest but struggling with pain with any activity. He has just attempted to work with PT and had much pain with this. Now lying in bed but complains 4-5/10 pain now. This is now severe pain for him but is much improved from a couple days ago. He is able to talk and joke today and able to reach across for his water now without grimacing as he was before. Discussed goal to increase Lyrica since he describes neuropathic pain. Encouraged him to take prn medication ~20 min prior to activity.   We also spent some time discussing hospice today as well. Tanner [name] had many questions about hospice and questioning the benefits of pursing treatment vs focusing on comfort with hospice. He is very open to hospice "if this is what is best for me." Emotional  support provided. All questions/concerns  addressed.   Recommendations/Plan:  Bone mets pain: Pain 0/10 last night and this morning up to 4-5/10 so much improved overall  Continue MS Contin 45 mg every 8 hours.   OxyIR to 15 mg every 3 hours prn.   Morphine IV 2 mg every 4 hours prn for severe breakthrough pain.   Continue decadron 8 mg po TID for now.   Lyrica 75 mg increased to 150 po BID.   He is not interested in pursuing any further radiation even if indicated.   LBM: Pt reports last good BM ~1 week ago. Senokot 2 tabs daily. No BM yet but abd soft, +BS, no pain. Has had magnesium citrate and to have enema today per RN.    Goals of Care and Additional Recommendations:  Limitations on Scope of Treatment: Full Scope Treatment  Code Status:  DNR   Prognosis:   Extremely poor with progressing advanced cancer and worsening pain and functional status. Certainly eligible for hospice is forego further treatment.   Discharge Planning:  To Be Determined. Likely home with his ex-wife, home health, and outpatient palliative follow up.   Care plan was discussed with Dr. Florene Glen, RN  Thank you for allowing the Palliative Medicine Team to assist in the care of this patient.   Total Time 40 min Prolonged Time Billed  no       Greater than 50%  of this time was spent counseling and coordinating care related to the above assessment and plan.  Vinie Sill, NP Palliative Medicine Team Pager # [phone] (M-F 8a-5p) Team Phone # [phone] (Nights/Weekends)

## 2017-08-24 NOTE — Progress Notes (Signed)
Physical Therapy Treatment Patient Details Name: Tanner [name] MRN: [id number] DOB: March 22, 1956 Today's Date: 08/24/2017    History of Present Illness 62 yo male admitted with acute respiratory failure, pain issues. Hx of ETOH abuse, prostate cancer, stage IV spindle cell cancer, bony mets, rib fractures, Hep, DVT, CVA, HTN    PT Comments    Decrease in activity tolerance today, pt reported severe low back pain in standing with a rolling walker. He was unable to tolerate standing and returned to supine. Assisted with bed mobility for linen change. PT now recommending SNF due to decline in activity tolerance.    Follow Up Recommendations  Supervision/Assistance - 24 hour;SNF(decline in activity tolerance today, likely needs SNF)     Equipment Recommendations  Wheelchair (measurements PT);3in1 (PT)    Recommendations for Other Services       Precautions / Restrictions Precautions Precautions: Fall Precaution Comments: monitor O2 Restrictions Weight Bearing Restrictions: No    Mobility  Bed Mobility   Bed Mobility: Rolling;Sidelying to Sit;Sit to Sidelying Rolling: Min assist Sidelying to sit: Min guard     Sit to sidelying: Min guard General bed mobility comments: min A to initiate movement for rolling  Transfers Overall transfer level: Needs assistance Equipment used: Rolling walker (2 wheeled) Transfers: Sit to/from Stand Sit to Stand: Min assist;From elevated surface         General transfer comment: Assist to rise, stabilize, control descent. VCs safety, hand placement. Pt reported severe LBP in standing and was unable to tolerate further activity in standing, assisted pt back to bed. Pain meds requested.  Ambulation/Gait                 Stairs            Wheelchair Mobility    Modified Rankin (Stroke Patients Only)       Balance Overall balance assessment: Needs assistance   Sitting balance-Leahy Scale: Good     Standing balance  support: Bilateral upper extremity supported Standing balance-Leahy Scale: Poor                              Cognition Arousal/Alertness: Awake/alert Behavior During Therapy: WFL for tasks assessed/performed Overall Cognitive Status: Within Functional Limits for tasks assessed                                        Exercises      General Comments        Pertinent Vitals/Pain Pain Location: B shoulders & scapulae; Low back in standing Pain Descriptors / Indicators: Sore;Aching;Grimacing;Guarding;Discomfort Pain Intervention(s): Limited activity within patient's tolerance;Monitored during session;Premedicated before session;Repositioned;Patient requesting pain meds-RN notified    Home Living                      Prior Function            PT Goals (current goals can now be found in the care plan section) Acute Rehab PT Goals Patient Stated Goal: less pain, meet first grandchild (due in April) PT Goal Formulation: With patient/family Time For Goal Achievement: 09/06/17 Potential to Achieve Goals: Fair Progress towards PT goals: Not progressing toward goals - comment(pain limiting activity tolerance)    Frequency    Min 3X/week      PT Plan Discharge plan needs to be updated  Co-evaluation              AM-PAC PT "6 Clicks" Daily Activity  Outcome Measure  Difficulty turning over in bed (including adjusting bedclothes, sheets and blankets)?: A Lot Difficulty moving from lying on back to sitting on the side of the bed? : A Lot Difficulty sitting down on and standing up from a chair with arms (e.g., wheelchair, bedside commode, etc,.)?: A Lot Help needed moving to and from a bed to chair (including a wheelchair)?: A Little Help needed walking in hospital room?: Total Help needed climbing 3-5 steps with a railing? : Total 6 Click Score: 11    End of Session   Activity Tolerance: Patient limited by fatigue;Patient  limited by pain Patient left: in bed;with call bell/phone within reach;with family/visitor present;with nursing/sitter in room Nurse Communication: Mobility status;Patient requests pain meds PT Visit Diagnosis: Muscle weakness (generalized) (M62.81);Difficulty in walking, not elsewhere classified (R26.2);Pain Pain - part of body: (upper back)     Time: 7564-3329 PT Time Calculation (min) (ACUTE ONLY): 25 min  Charges:  $Therapeutic Activity: 23-37 mins                    G Codes:          Philomena Doheny 08/24/2017, 11:00 AM [phone]

## 2017-08-25 ENCOUNTER — Inpatient Hospital Stay (HOSPITAL_COMMUNITY): Payer: Medicare HMO

## 2017-08-25 LAB — BASIC METABOLIC PANEL
ANION GAP: 10 (ref 5–15)
Anion gap: 10 (ref 5–15)
BUN: 26 mg/dL — AB (ref 6–20)
BUN: 29 mg/dL — ABNORMAL HIGH (ref 6–20)
CALCIUM: 9.4 mg/dL (ref 8.9–10.3)
CHLORIDE: 92 mmol/L — AB (ref 101–111)
CO2: 33 mmol/L — ABNORMAL HIGH (ref 22–32)
CO2: 31 mmol/L (ref 22–32)
CREATININE: 0.64 mg/dL (ref 0.61–1.24)
Calcium: 9.2 mg/dL (ref 8.9–10.3)
Chloride: 93 mmol/L — ABNORMAL LOW (ref 101–111)
Creatinine, Ser: 0.76 mg/dL (ref 0.61–1.24)
GFR calc Af Amer: >60 mL/min (ref >60)
GFR calc Af Amer: >60 mL/min (ref >60)
GFR calc non Af Amer: >60 mL/min (ref >60)
GLUCOSE: 128 mg/dL — AB (ref 65–99)
GLUCOSE: 127 mg/dL — AB (ref 65–99)
POTASSIUM: 4.7 mmol/L (ref 3.5–5.1)
Potassium: 4.3 mmol/L (ref 3.5–5.1)
SODIUM: 136 mmol/L (ref 135–145)
Sodium: 133 mmol/L — ABNORMAL LOW (ref 135–145)

## 2017-08-25 LAB — BLOOD GAS, ARTERIAL
ACID-BASE EXCESS: 7.8 mmol/L — AB (ref 0.0–2.0)
Bicarbonate: 32.9 mmol/L — ABNORMAL HIGH (ref 20.0–28.0)
Drawn by: 295031
O2 Content: 9 L/min
O2 Saturation: 85.4 %
PCO2 ART: 49.5 mmHg — AB (ref 32.0–48.0)
PH ART: 7.437 (ref 7.350–7.450)
Patient temperature: 98.6
pO2, Arterial: 53.6 mmHg — ABNORMAL LOW (ref 83.0–108.0)

## 2017-08-25 LAB — CBC
HCT: 28.8 % — ABNORMAL LOW (ref 39.0–52.0)
HEMATOCRIT: 30.3 % — AB (ref 39.0–52.0)
HEMOGLOBIN: 10 g/dL — AB (ref 13.0–17.0)
Hemoglobin: 9.3 g/dL — ABNORMAL LOW (ref 13.0–17.0)
MCH: 31.7 pg (ref 26.0–34.0)
MCH: 32.6 pg (ref 26.0–34.0)
MCHC: 32.3 g/dL (ref 30.0–36.0)
MCHC: 33 g/dL (ref 30.0–36.0)
MCV: 98.3 fL (ref 78.0–100.0)
MCV: 98.7 fL (ref 78.0–100.0)
PLATELETS: 261 10*3/uL (ref 150–400)
Platelets: 236 10*3/uL (ref 150–400)
RBC: 2.93 MIL/uL — ABNORMAL LOW (ref 4.22–5.81)
RBC: 3.07 MIL/uL — AB (ref 4.22–5.81)
RDW: 13.2 % (ref 11.5–15.5)
RDW: 13.6 % (ref 11.5–15.5)
WBC: 8.4 10*3/uL (ref 4.0–10.5)
WBC: 11.1 10*3/uL — AB (ref 4.0–10.5)

## 2017-08-25 LAB — BRAIN NATRIURETIC PEPTIDE: B Natriuretic Peptide: 33.4 pg/mL (ref 0.0–100.0)

## 2017-08-25 LAB — MAGNESIUM: MAGNESIUM: 2 mg/dL (ref 1.7–2.4)

## 2017-08-25 LAB — PROTIME-INR
INR: 1.12
Prothrombin Time: 14.3 seconds (ref 11.4–15.2)

## 2017-08-25 LAB — LACTIC ACID, PLASMA
LACTIC ACID, VENOUS: 1.6 mmol/L (ref 0.5–1.9)
Lactic Acid, Venous: 3.4 mmol/L (ref 0.5–1.9)

## 2017-08-25 LAB — APTT: aPTT: 29 seconds (ref 24–36)

## 2017-08-25 LAB — PROCALCITONIN: Procalcitonin: 0.28 ng/mL

## 2017-08-25 MED ORDER — PIPERACILLIN-TAZOBACTAM 3.375 G IVPB
3.3750 g | Freq: Three times a day (TID) | INTRAVENOUS | Status: DC
  Administered 2017-08-26 – 2017-09-02 (×20): 3.375 g via INTRAVENOUS
  Filled 2017-08-25 (×24): qty 50

## 2017-08-25 MED ORDER — MORPHINE SULFATE ER 30 MG PO TBCR
60.0000 mg | EXTENDED_RELEASE_TABLET | Freq: Three times a day (TID) | ORAL | Status: DC
  Administered 2017-08-25: 60 mg via ORAL
  Filled 2017-08-25: qty 2

## 2017-08-25 MED ORDER — SENNA 8.6 MG PO TABS
2.0000 | ORAL_TABLET | Freq: Two times a day (BID) | ORAL | Status: DC
  Administered 2017-08-25: 17.2 mg via ORAL
  Filled 2017-08-25 (×3): qty 2

## 2017-08-25 MED ORDER — DICLOFENAC SODIUM 1 % TD GEL
2.0000 g | Freq: Four times a day (QID) | TRANSDERMAL | Status: DC
  Administered 2017-08-25 – 2017-08-27 (×4): 2 g via TOPICAL
  Filled 2017-08-25: qty 100

## 2017-08-25 MED ORDER — NALOXONE HCL 0.4 MG/ML IJ SOLN
0.4000 mg | INTRAMUSCULAR | Status: DC | PRN
  Administered 2017-08-25 – 2017-08-26 (×2): 0.4 mg via INTRAVENOUS
  Filled 2017-08-25 (×2): qty 1

## 2017-08-25 MED ORDER — LIDOCAINE 5 % EX PTCH
1.0000 | MEDICATED_PATCH | CUTANEOUS | Status: DC
  Administered 2017-08-25 – 2017-08-27 (×3): 1 via TRANSDERMAL
  Filled 2017-08-25 (×4): qty 1

## 2017-08-25 MED ORDER — MORPHINE SULFATE (PF) 4 MG/ML IV SOLN
2.0000 mg | INTRAVENOUS | Status: DC | PRN
  Administered 2017-08-25 (×2): 2 mg via INTRAVENOUS
  Filled 2017-08-25 (×2): qty 1

## 2017-08-25 MED ORDER — BISACODYL 10 MG RE SUPP
10.0000 mg | Freq: Once | RECTAL | Status: AC
  Administered 2017-08-25: 10 mg via RECTAL
  Filled 2017-08-25: qty 1

## 2017-08-25 MED ORDER — VANCOMYCIN HCL IN DEXTROSE 1-5 GM/200ML-% IV SOLN
1000.0000 mg | Freq: Once | INTRAVENOUS | Status: AC
  Administered 2017-08-25: 1000 mg via INTRAVENOUS
  Filled 2017-08-25: qty 200

## 2017-08-25 MED ORDER — SODIUM CHLORIDE 0.9 % IV BOLUS (SEPSIS)
1000.0000 mL | Freq: Once | INTRAVENOUS | Status: AC
  Administered 2017-08-25: 1000 mL via INTRAVENOUS

## 2017-08-25 MED ORDER — METOPROLOL TARTRATE 25 MG PO TABS: 25.0000 mg | ORAL_TABLET | Freq: Two times a day (BID) | ORAL | Status: DC

## 2017-08-25 MED ORDER — PIPERACILLIN-TAZOBACTAM 3.375 G IVPB 30 MIN
3.3750 g | Freq: Once | INTRAVENOUS | Status: AC
  Administered 2017-08-25: 3.375 g via INTRAVENOUS
  Filled 2017-08-25 (×3): qty 50

## 2017-08-25 MED ORDER — OXYCODONE HCL 5 MG PO TABS
15.0000 mg | ORAL_TABLET | ORAL | Status: DC | PRN
Start: 2017-08-25 — End: 2017-08-25

## 2017-08-25 MED ORDER — ONDANSETRON HCL 4 MG/2ML IJ SOLN
INTRAMUSCULAR | Status: AC
Start: 2017-08-25 — End: 2017-08-25
  Administered 2017-08-25: 4 mg
  Filled 2017-08-25: qty 2

## 2017-08-25 MED ORDER — IPRATROPIUM-ALBUTEROL 0.5-2.5 (3) MG/3ML IN SOLN
3.0000 mL | Freq: Four times a day (QID) | RESPIRATORY_TRACT | Status: DC
  Administered 2017-08-25 – 2017-08-27 (×9): 3 mL via RESPIRATORY_TRACT
  Filled 2017-08-25 (×9): qty 3

## 2017-08-25 MED ORDER — SODIUM CHLORIDE 0.9 % IV SOLN
INTRAVENOUS | Status: AC
  Administered 2017-08-25 – 2017-08-26 (×2): via INTRAVENOUS

## 2017-08-25 MED ORDER — VANCOMYCIN HCL IN DEXTROSE 1-5 GM/200ML-% IV SOLN
1000.0000 mg | Freq: Two times a day (BID) | INTRAVENOUS | Status: DC
  Administered 2017-08-26 – 2017-08-28 (×5): 1000 mg via INTRAVENOUS
  Filled 2017-08-25 (×5): qty 200

## 2017-08-25 MED ORDER — ACETAMINOPHEN 325 MG PO TABS
650.0000 mg | ORAL_TABLET | Freq: Four times a day (QID) | ORAL | Status: DC | PRN
  Administered 2017-08-26 – 2017-08-27 (×3): 650 mg via ORAL
  Filled 2017-08-25 (×3): qty 2

## 2017-08-25 NOTE — Progress Notes (Signed)
Daily Progress Note   Patient Name: Tanner [name]       Date: 08/25/2017 DOB: 1956-03-15  Age: 62 y.o. MRN#: [id number] Attending Physician: Elodia Florence., * Primary Care Physician: [name], NP Admit Date: 08/20/2017  Reason for Consultation/Follow-up: Pain control  Subjective: [name] is still complaining of pain now radiating from his shoulder blades into lower back.   Length of Stay: 4  Current Medications: Scheduled Meds:  . dexamethasone  8 mg Oral Q8H  . diclofenac sodium  2 g Topical QID  . docusate sodium  100 mg Oral BID  . feeding supplement (ENSURE ENLIVE)  237 mL Oral TID BM  . guaiFENesin  600 mg Oral BID  . ipratropium-albuterol  3 mL Nebulization Q6H  . lidocaine  1 patch Transdermal Q24H  . multivitamin with minerals  1 tablet Oral Daily  . pantoprazole  40 mg Oral Daily  . polyethylene glycol  17 g Oral Daily  . pregabalin  150 mg Oral BID  . PRUTECT  1 application Topical BID  . senna  2 tablet Oral BID  . simvastatin  20 mg Oral q1800  . sodium chloride flush  3 mL Intravenous Q12H  . thiamine  100 mg Oral Daily    Continuous Infusions: . sodium chloride    . sodium chloride 75 mL/hr at 08/25/17 2033  . [START ON 08/26/2017] piperacillin-tazobactam (ZOSYN)  IV    . vancomycin 1,000 mg (08/25/17 2138)  . [START ON 08/26/2017] vancomycin      PRN Meds: sodium chloride, acetaminophen, albuterol, bisacodyl, diazepam, naLOXone (NARCAN)  injection, ondansetron, sodium chloride flush, traZODone  Physical Exam  Constitutional: He is oriented to person, place, and time. He appears well-developed. He appears ill.  HENT:  Head: Normocephalic and atraumatic.  Cardiovascular: Normal rate and regular rhythm.  Pulmonary/Chest: No accessory muscle  usage. No tachypnea. No respiratory distress.  Oxygen needs decreasing  Abdominal: Normal appearance.  Neurological: He is alert and oriented to person, place, and time.  Nursing note and vitals reviewed.           Vital Signs: BP (!) 108/55   Pulse 85   Temp 99.2 F (37.3 C) (Axillary)   Resp 15   Ht 5\' 10"  (1.778 m)   Wt 68.4 kg (150 lb 12.7 oz)  SpO2 98%   BMI 21.64 kg/m  SpO2: SpO2: 98 % O2 Device: O2 Device: High Flow Nasal Cannula O2 Flow Rate: O2 Flow Rate (L/min): 10 L/min  Intake/output summary:   Intake/Output Summary (Last 24 hours) at 08/25/2017 2139 Last data filed at 08/25/2017 0406 Gross per 24 hour  Intake -  Output 350 ml  Net -350 ml   LBM: Last BM Date: 08/15/17 Baseline Weight: Weight: 68 kg (150 lb) Most recent weight: Weight: 68.4 kg (150 lb 12.7 oz)       Palliative Assessment/Data: 30%      Patient Active Problem List   Diagnosis Date Noted  . Malnutrition of moderate degree 08/24/2017  . Cancer associated pain   . Palliative care encounter   . Hypoxia 08/20/2017  . Acute respiratory failure with hypoxia ([place]) 08/20/2017  . COPD (chronic obstructive pulmonary disease) ([place]) 08/20/2017  . Constipation 08/14/2017  . Malignant tumor, spindle cell type ([place]) 07/16/2017  . Goals of care, counseling/discussion 07/16/2017  . Chronic low back pain 07/04/2017  . Bone metastasis ([place]) 06/23/2017  . COPD with acute exacerbation ([place]) 05/15/2017  . Multiple lung nodules on CT   . Near syncope 05/03/2017  . Lung mass 05/03/2017  . Hyperlipidemia 09/20/2016  . Protein-calorie malnutrition ([place]) 08/17/2016  . Thoracic compression fracture, closed, initial encounter ([place]) 08/17/2016  . Alcohol dependence ([place]) 08/17/2016  . Tobacco dependence 08/17/2016  . Osteoporosis 08/17/2016  . Hip fracture ([place]) 08/16/2016  . Headache 02/24/2016  . Hypertensive urgency 02/03/2016  . Shortness of breath 07/28/2015  . Generalized anxiety disorder  07/28/2015  . Medicare annual wellness visit, subsequent 05/24/2015  . Acute DVT of right tibial vein ([place]) 02/27/2015  . Tibia fracture 02/20/2015  . Syncope 02/18/2015  . Essential hypertension 02/18/2015  . History of stroke 02/18/2015  . Fracture of tibia, right, closed   . C. difficile colitis 09/11/2013  . UTI (urinary tract infection) 09/11/2013  . Essential hypertension, benign 09/11/2013  . Ileus ([place]) 09/10/2013  . Spastic hemiplegia affecting dominant side ([place]) 09/09/2013  . Ileus, postoperative ([place]) 09/09/2013  . Hypokalemia 09/09/2013  . Nonspecific (abnormal) findings on radiological and other examination of gastrointestinal tract 09/09/2013  . CVA (cerebral infarction) 09/05/2013  . 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 Malignant neoplasm of prostate ([place]) 08/29/2013  . Hyponatremia 12/11/2012    Class: Chronic  . Ankle fracture 12/10/2012    Palliative Care Assessment & Plan   HPI: 62 y.o. 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 Palliative care requested for assistance in pain management.   Recommendations/Plan:  Bone mets pain: Reports pain worse today.  MAR reviewed and in the last 24 hours he has had the oral morphine equivalent of 294mg  of morphine (MS contin 45mg  x 3 doses, Oxycodone 15mg  x 6 doses, morphine 2mg  IV x 4 doses)  Currently asking for increase in pain medication.  As he has been using frequent rescue dose, plan for increase in MS Contin to 60 mg every 8 hours.   OxyIR to 15 mg every 3 hours prn.   Morphine IV 2 mg every 4 hours prn for severe breakthrough pain.   Continue decadron 8 mg po TID for now.   Lyrica 75 mg increased to 150  po  BID.   He is not interested in pursuing any further radiation even if indicated.   LBM: Pt reports last good BM ~1 week ago. Senokot 2 tabs daily. No BM yet but abd soft, +BS, no pain. Has had magnesium citrate and to have enema today per RN.    Goals of Care and Additional Recommendations:  Limitations on Scope of Treatment: Full Scope Treatment- Reports that he has grandchild due in April. Asking today "how much time left" as he wants to see grandchild born  Code Status:  DNR   Prognosis:   Extremely poor with progressing advanced cancer and worsening pain and functional status. Certainly eligible for hospice is forego further treatment.   Discharge Planning:  To Be Determined. Likely home with his ex-wife, home health, and outpatient palliative follow up.   Care plan was discussed with Dr. Florene Glen, RN  Thank you for allowing the Palliative Medicine Team to assist in the care of this patient.   Total Time 40 min Prolonged Time Billed  no       Greater than 50%  of this time was spent counseling and coordinating care related to the above assessment and plan.  Micheline Rough, MD [facility] [phone]

## 2017-08-25 NOTE — Treatment Plan (Addendum)
Called to bedside for transfer to ICU. Patient noted to be drowsy and with worsening hypoxia.   Requiring NRB on transfer. A&Ox4.  C/o pain, feels sleepy.  Vitals:   08/25/17 1740 08/25/17 1800  BP: 94/62 104/72  Pulse: 94 94  Resp: 17 20  Temp:    SpO2: 99% (!) 86%  decreased breath sounds bilaterally RRR, no mgr Abd s/nt/nd No LEE Moving all extremities  A/P 62 yo male with hx of metastatic spindle cell cancer with bony mets and pathologic fractures with decompensation in respiratory status likely secondary to oversedation on his narcotics.  His AHRF on presentation thought to be 2/2 splinting due to pain with COPD as well, it had been improving, but today he seemed to be in more pain, requiring more pr IV meds, but he also seemed a bit sleepy.  His MS contin was increased today.  Will discontinue MS contin.  Discontinue IV dilaudid.  For now, will continue PO oxycodone as he has significant pain, but this should only be given if pt seems to be alert and stable from a respiratory status.  Narcan ordered prn.  CXR, BNP, ABG, CBC, BMP ordered as well.  Called listed contacts to update on status, but no answer at any number listed.   Addendum  Initially BP had improved, but repeat BP in the 42'L systolic.  Labs notable for slightly elevated WBC, ABG with slightly elevated PCO2, BMP relatively unremarkable, BNP wnl.   CXR unchanged.   On reevaluation, he was somnolent and arousable.  A&Ox4.  No focal deficits.  Hypotensive with BP 84/59.  Will bolus, given dose narcan.  Due to hypotension, AMS, and elevated WBC, can't exclude sepsis.  Code sepsis, blood cx, urine cx, procalcitonin, vanc/zosyn.  Will continue to monitor.  Hopefully will be able to d/c abx if cx negative and he improves with holding opiates.  Will d/c prn oxycodone for now.  If he needs overnight, can page for prn oxycodone, but pt should be alert, stable from a respiratory status, and with good blood pressures.

## 2017-08-25 NOTE — Progress Notes (Addendum)
Tanner [name] had a small hard bowel movement after receiving dulcolax suppository.

## 2017-08-25 NOTE — Progress Notes (Signed)
Pharmacy Antibiotic Note  Tanner [name] is a 62 y.o. male admitted on 08/20/2017 withacute respiratory failure & pain.  He was recently diagnosed with metastatic spindle cell cancer.   He became lethargic today with worsening hypoxia, hypotension.Marland Kitchen  Pharmacy has been consulted for Vancomycin & Zosyn dosing for sepsis. 08/25/2017:  CXR stable  Afebrile  Slight increase in WBC  Bump in Scr today 0.64-->0.76  Plan: Zosyn 3.375g IV q8h (4 hour infusion).  Vancomycin 1gm IV q12h (target AUC 400-500) Daily Scr Monitor renal function and cx data   Height: 5\' 10"  (177.8 cm) Weight: 150 lb 12.7 oz (68.4 kg) IBW/kg (Calculated) : 73  Temp (24hrs), Avg:98.9 F (37.2 C), Min:98 F (36.7 C), Max:99.9 F (37.7 C)  Recent Labs  Lab 08/20/17 2309  08/22/17 0407 08/23/17 0439 08/24/17 0439 08/25/17 0352 08/25/17 1802  WBC  --    < > 2.5* 5.4 7.5 8.4 11.1*  CREATININE  --    < > 0.67 0.60* 0.60* 0.64 0.76  LATICACIDVEN 0.39*  --   --   --   --   --   --    < > = values in this interval not displayed.    Estimated Creatinine Clearance: 96.2 mL/min (by C-G formula based on SCr of 0.76 mg/dL).    Allergies  Allergen Reactions  . Dilaudid [Hydromorphone Hcl] Nausea And Vomiting    Pre pt history  . Procaine Hcl Other (See Comments)    08/10/2017: The patient indicated that Novocaine was used by a dermatologist prior to obtaining biopsy on his back "many years ago". Patient experienced a syncopal reaction by his report. The patient has since had dental anesthetic without any adverse reactions.   . Sulfa Antibiotics Rash    Antimicrobials this admission: 2/16 Vanc >>  2/16 Zosyn >>   Dose adjustments this admission:  Microbiology results: 2/16 BCx:  2/16 UCx:    Thank you for allowing pharmacy to be a part of this patient's care.  Biagio Borg 08/25/2017 7:51 PM

## 2017-08-25 NOTE — Progress Notes (Signed)
PROGRESS NOTE    Tanner [name]  OIZ:[id number] DOB: 09-16-1955 DOA: 08/20/2017 PCP: [name], NP   Brief Narrative:  Tanner [name] 62 y.o.malewith medical history significant ofrecently diagnosed stage IV cancer of unknown primary distorted chemo 3 weeks ago for palliative treatment comes in with shortness of breath. Family reports that he has pain is been difficult to control at home he is got multiple bony lytic lesions with rib fractures. They just adjusted his pain medications last week. He has been short of breath at home and coughing.  Assessment & Plan:   Principal Problem:   Acute respiratory failure with hypoxia (HCC) Active Problems:   Essential hypertension, benign   Generalized anxiety disorder   Bone metastasis (HCC)   Malignant tumor, spindle cell type (HCC)   Hypoxia   COPD (chronic obstructive pulmonary disease) (HCC)   Cancer associated pain   Palliative care encounter   Malnutrition of moderate degree   Metastatic spindle cell cancer -Stage IV, poorly differentiated spindle cell neoplasm, status post palliative radiotherapy, currently on systemic chemotherapy - Pain now seems to be Tanner [name] bit worse today.  Needing more IV pain meds over past day.  Was Tanner [name] bit drowsy in room with me.  Will discuss with palliative.  - Appreciate palliative assistance (MS contin 45 mg q8 hrs, oxycodone 15 mg q4, morphine 2 mg q4 prn breakthrough) - lyrica as well as decadron 8 po TID   - voltaren (d'c'd), lidocaine patch added for pain as well -Consulted palliative care for pain control, appreciate input - patient is now DNR - PT to eval, currently recommending SNF  Acute respiratory failure with hypoxia - Back on 2 L this morning, may be related to worsening pain control again, but also with rhonchi and some wheezing on exam.  Will follow CXR.   - Duonebs to q6, prn albuterol, receiving steroids - Encourage IS - CT scan without PE, notable for metastatic disease  and bony mets and pathologic rib fractures - has recent echo that showed normal EF, grade 1 diastolic dysfunction, does not appear overloaded on exam -He has Tanner [name] history of COPD, some scattered wheezing today, continue nebs and receiving steroids as above -Suspect multifactorial due to poor inspiratory effort in the setting of multiple rib metastasis COPD -Pain control, supplemental oxygen  COPD -continue duo nebs as needed -Possibly he has Tanner [name] degree of chronic respiratory failure  Metabolic Alkalosis: continue to monitor, follow VBG, was Tanner [name] bit sleepy during my interview, ? Narcotics vs hypercarbia with COPD?  Hypertension -Controlled, decrease metoprolol to 50 BID, hold amlodipine.  Had some soft BP's yesterday which limited pain meds.  Continue to monitor closely.   R Shoulder Pain - negative plain films, continue to monitor  Hyperlipidemia -Continue statin  Anemia  Leukopenia: likely related to malignancy and chemotherapy, will continue to monitor  DVT prophylaxis: SCD Code Status: DNR Family Communication: none at bedside, daugther by phone 2/14 Disposition Plan: pending   Consultants:   Palliative care  Procedures:   none  Antimicrobials:   none    Subjective: Pain worse this morning. Notes had loose BM.  Feels like he's taken Tanner [name] step back.   Objective: Vitals:   08/24/17 2008 08/24/17 2040 08/25/17 0406 08/25/17 0600  BP:  110/68 111/74   Pulse:  89 85   Resp:  16 14   Temp:  98 F (36.7 C) 98.4 F (36.9 C)   TempSrc:  Oral Oral   SpO2: 92% 94% 92% 93%  Weight:   68.4 kg (150 lb 12.7 oz)   Height:        Intake/Output Summary (Last 24 hours) at 08/25/2017 1338 Last data filed at 08/25/2017 0406 Gross per 24 hour  Intake -  Output 550 ml  Net -550 ml   Filed Weights   08/20/17 1824 08/24/17 1556 08/25/17 0406  Weight: 68 kg (150 lb) 67.2 kg (148 lb 2.4 oz) 68.4 kg (150 lb 12.7 oz)    Examination:  General: No acute distress.  Moves  gingerly.  Cardiovascular: Heart sounds show [name] regular rate, and rhythm. No gallops or rubs. No murmurs. No JVD. Lungs: [place] in place.  Slightly labored.  Some rhonchi and wheezing diffusely.   Abdomen: Soft, nontender, nondistended with normal active bowel sounds. No masses. No hepatosplenomegaly. Neurological: Alert and oriented 3. Moves all extremities 4 with equal strength. Cranial nerves II through XII grossly intact. Skin: Warm and dry. No rashes or lesions. Extremities: No clubbing or cyanosis. No edema.  Psychiatric: Mood and affect are normal. Insight and judgment are appropriate.   Data Reviewed: I have personally reviewed following labs and imaging studies  CBC: Recent Labs  Lab 08/20/17 1851 08/21/17 0450 08/22/17 0407 08/23/17 0439 08/24/17 0439 08/25/17 0352  WBC 2.2* 2.4* 2.5* 5.4 7.5 8.4  NEUTROABS 1.2*  --   --   --   --   --   HGB 8.7* 8.1* 10.5* 9.1* 9.5* 9.3*  HCT 25.2* 24.1* 31.4* 27.6* 28.4* 28.8*  MCV 97.7 98.8 98.4 98.2 98.3 98.3  PLT 214 196 222 273 260 009   Basic Metabolic Panel: Recent Labs  Lab 08/21/17 0450 08/22/17 0407 08/23/17 0439 08/24/17 0439 08/25/17 0352  NA 134* 133* 136 135 136  K 4.0 4.6 4.7 4.5 4.3  CL 98* 95* 94* 95* 93*  CO2 27 27 30 28  33*  GLUCOSE 89 134* 136* 151* 128*  BUN 9 8 13  21* 26*  CREATININE 0.70 0.67 0.60* 0.60* 0.64  CALCIUM 8.7* 9.8 9.5 9.5 9.4  MG  --   --   --   --  2.0   GFR: Estimated Creatinine Clearance: 96.2 mL/min (by C-G formula based on SCr of 0.64 mg/dL). Liver Function Tests: Recent Labs  Lab 08/20/17 1851 08/23/17 0439  AST 21 17  ALT 13* 11*  ALKPHOS 94 87  BILITOT 0.2* 0.2*  PROT 6.4* 6.7  ALBUMIN 2.4* 2.4*   No results for input(s): LIPASE, AMYLASE in the last 168 hours. No results for input(s): AMMONIA in the last 168 hours. Coagulation Profile: No results for input(s): INR, PROTIME in the last 168 hours. Cardiac Enzymes: No results for input(s): CKTOTAL, CKMB, CKMBINDEX,  TROPONINI in the last 168 hours. BNP (last 3 results) No results for input(s): PROBNP in the last 8760 hours. HbA1C: No results for input(s): HGBA1C in the last 72 hours. CBG: No results for input(s): GLUCAP in the last 168 hours. Lipid Profile: No results for input(s): CHOL, HDL, LDLCALC, TRIG, CHOLHDL, LDLDIRECT in the last 72 hours. Thyroid Function Tests: No results for input(s): TSH, T4TOTAL, FREET4, T3FREE, THYROIDAB in the last 72 hours. Anemia Panel: No results for input(s): VITAMINB12, FOLATE, FERRITIN, TIBC, IRON, RETICCTPCT in the last 72 hours. Sepsis Labs: Recent Labs  Lab 08/20/17 2309  LATICACIDVEN 0.39*    No results found for this or any previous visit (from the past 240 hour(s)).       Radiology Studies: No results found.      Scheduled Meds: .  bisacodyl  10 mg Rectal Once  . dexamethasone  8 mg Oral Q8H  . docusate sodium  100 mg Oral BID  . feeding supplement (ENSURE ENLIVE)  237 mL Oral TID BM  . guaiFENesin  600 mg Oral BID  . ipratropium-albuterol  3 mL Nebulization Q6H  . metoprolol tartrate  50 mg Oral BID  . morphine  45 mg Oral Q8H  . multivitamin with minerals  1 tablet Oral Daily  . pantoprazole  40 mg Oral Daily  . polyethylene glycol  17 g Oral Daily  . pregabalin  150 mg Oral BID  . PRUTECT  1 application Topical BID  . senna  2 tablet Oral Daily  . simvastatin  20 mg Oral q1800  . sodium chloride flush  3 mL Intravenous Q12H  . thiamine  100 mg Oral Daily   Continuous Infusions: . sodium chloride       LOS: 4 days    Time spent: over 30 min    Fayrene Helper, MD [facility] Pager [phone]  If 7PM-7AM, please contact night-coverage [email or web address] Password [facility] 08/25/2017, 1:38 PM

## 2017-08-25 NOTE — Significant Event (Signed)
Rapid Response Event Note  Overview: Time Called: [place] Time: 4492 Event Type: Respiratory  Bedside RN called stating pt was lethargic and he was unable to get his O2 saturations up.   Initial Focused Assessment: Pt resting in the bed on 14 L 55% venti mask, satting 83-85%.  Pt drowsy but oriented x4.  Pt's motor function bilaterally weak but equal.  Breath sounds decreased on the right.  Pt complains of pain on the right side with a deep breath.  Interventions: Pt placed on 100% NRB, BP decreased to the 01'E systolic.  MD paged, pt transferred to 1226. MD at bedside upon transfer.  Orders for ABG and CXR placed.  Plan of Care (if not transferred): Pt transferred to 1226.  [place]

## 2017-08-26 ENCOUNTER — Inpatient Hospital Stay (HOSPITAL_COMMUNITY): Payer: Medicare HMO

## 2017-08-26 LAB — BASIC METABOLIC PANEL
Anion gap: 8 (ref 5–15)
BUN: 27 mg/dL — AB (ref 6–20)
CO2: 27 mmol/L (ref 22–32)
Calcium: 8.4 mg/dL — ABNORMAL LOW (ref 8.9–10.3)
Chloride: 99 mmol/L — ABNORMAL LOW (ref 101–111)
Creatinine, Ser: 0.7 mg/dL (ref 0.61–1.24)
Glucose, Bld: 174 mg/dL — ABNORMAL HIGH (ref 65–99)
POTASSIUM: 4.2 mmol/L (ref 3.5–5.1)
SODIUM: 134 mmol/L — AB (ref 135–145)

## 2017-08-26 LAB — CBC
HCT: 26.9 % — ABNORMAL LOW (ref 39.0–52.0)
Hemoglobin: 8.7 g/dL — ABNORMAL LOW (ref 13.0–17.0)
MCH: 32 pg (ref 26.0–34.0)
MCHC: 32.3 g/dL (ref 30.0–36.0)
MCV: 98.9 fL (ref 78.0–100.0)
PLATELETS: 209 10*3/uL (ref 150–400)
RBC: 2.72 MIL/uL — AB (ref 4.22–5.81)
RDW: 13.7 % (ref 11.5–15.5)
WBC: 9.9 10*3/uL (ref 4.0–10.5)

## 2017-08-26 LAB — BLOOD GAS, VENOUS
Acid-Base Excess: 5.4 mmol/L — ABNORMAL HIGH (ref 0.0–2.0)
BICARBONATE: 29.3 mmol/L — AB (ref 20.0–28.0)
O2 Saturation: 90.8 %
PH VEN: 7.462 — AB (ref 7.250–7.430)
Patient temperature: 98.6
pCO2, Ven: 41.6 mmHg — ABNORMAL LOW (ref 44.0–60.0)
pO2, Ven: 60.5 mmHg — ABNORMAL HIGH (ref 32.0–45.0)

## 2017-08-26 LAB — URINALYSIS, ROUTINE W REFLEX MICROSCOPIC
Bilirubin Urine: NEGATIVE
GLUCOSE, UA: NEGATIVE mg/dL
HGB URINE DIPSTICK: NEGATIVE
Ketones, ur: NEGATIVE mg/dL
LEUKOCYTES UA: NEGATIVE
Nitrite: NEGATIVE
PROTEIN: NEGATIVE mg/dL
SPECIFIC GRAVITY, URINE: 1.013 (ref 1.005–1.030)
pH: 7 (ref 5.0–8.0)

## 2017-08-26 LAB — MRSA PCR SCREENING: MRSA by PCR: NEGATIVE

## 2017-08-26 LAB — PROCALCITONIN: PROCALCITONIN: 0.3 ng/mL

## 2017-08-26 MED ORDER — ONDANSETRON HCL 4 MG/2ML IJ SOLN
4.0000 mg | Freq: Four times a day (QID) | INTRAMUSCULAR | Status: DC | PRN
  Administered 2017-08-27 – 2017-08-31 (×6): 4 mg via INTRAVENOUS
  Filled 2017-08-26 (×7): qty 2

## 2017-08-26 MED ORDER — ENOXAPARIN SODIUM 40 MG/0.4ML ~~LOC~~ SOLN
40.0000 mg | Freq: Every day | SUBCUTANEOUS | Status: DC
  Administered 2017-08-26 – 2017-09-04 (×8): 40 mg via SUBCUTANEOUS
  Filled 2017-08-26 (×10): qty 0.4

## 2017-08-26 MED ORDER — IOPAMIDOL (ISOVUE-370) INJECTION 76%
INTRAVENOUS | Status: AC
  Administered 2017-08-26: 100 mL
  Filled 2017-08-26: qty 100

## 2017-08-26 NOTE — Progress Notes (Signed)
Daily Progress Note   Patient Name: Tanner [name]       Date: 08/26/2017 DOB: 20-Jan-1956  Age: 62 y.o. MRN#: [id number] Attending Physician: Elodia Florence., * Primary Care Physician: [name], NP Admit Date: 08/20/2017  Reason for Consultation/Follow-up: Pain control  Subjective: Chart reviewed and events of last evening noted.  I met today with [name] and we discussed sedation last evening and concern that narcotics were primary contributing factor to this event.  He has end-stage cancer and we talked about one of his goals being adequate pain control.  At this time, however, his main focus remains on trying to make it until his daughter gives birth in April.  Length of Stay: 5  Current Medications: Scheduled Meds:  . iopamidol      . dexamethasone  8 mg Oral Q8H  . diclofenac sodium  2 g Topical QID  . docusate sodium  100 mg Oral BID  . enoxaparin (LOVENOX) injection  40 mg Subcutaneous Daily  . feeding supplement (ENSURE ENLIVE)  237 mL Oral TID BM  . guaiFENesin  600 mg Oral BID  . ipratropium-albuterol  3 mL Nebulization Q6H  . lidocaine  1 patch Transdermal Q24H  . multivitamin with minerals  1 tablet Oral Daily  . pantoprazole  40 mg Oral Daily  . polyethylene glycol  17 g Oral Daily  . pregabalin  150 mg Oral BID  . PRUTECT  1 application Topical BID  . senna  2 tablet Oral BID  . simvastatin  20 mg Oral q1800  . sodium chloride flush  3 mL Intravenous Q12H  . thiamine  100 mg Oral Daily    Continuous Infusions: . sodium chloride    . sodium chloride 75 mL/hr at 08/26/17 0700  . piperacillin-tazobactam (ZOSYN)  IV 3.375 g (08/26/17 1344)  . vancomycin Stopped (08/26/17 7510)    PRN Meds: sodium chloride, acetaminophen, albuterol, bisacodyl,  diazepam, naLOXone (NARCAN)  injection, ondansetron (ZOFRAN) IV, ondansetron, sodium chloride flush, traZODone  Physical Exam  Constitutional: He is oriented to person, place, and time. He appears well-developed. He appears ill.  HENT:  Head: Normocephalic and atraumatic.  Cardiovascular: Normal rate and regular rhythm.  Pulmonary/Chest: No accessory muscle usage. No tachypnea. No respiratory distress.  Oxygen needs decreasing  Abdominal: Normal appearance.  Neurological:  He is alert and oriented to person, place, and time.  Nursing note and vitals reviewed.           Vital Signs: BP (!) 148/90   Pulse (!) 111   Temp 98.3 F (36.8 C) (Oral)   Resp 16   Ht 5' 10"  (1.778 m)   Wt 68.4 kg (150 lb 12.7 oz)   SpO2 99%   BMI 21.64 kg/m  SpO2: SpO2: 99 % O2 Device: O2 Device: Nasal Cannula O2 Flow Rate: O2 Flow Rate (L/min): 5 L/min  Intake/output summary:   Intake/Output Summary (Last 24 hours) at 08/26/2017 1641 Last data filed at 08/26/2017 1500 Gross per 24 hour  Intake 3867.5 ml  Output 350 ml  Net 3517.5 ml   LBM: Last BM Date: 08/25/17 Baseline Weight: Weight: 68 kg (150 lb) Most recent weight: Weight: 68.4 kg (150 lb 12.7 oz)       Palliative Assessment/Data: 30%      Patient Active Problem List   Diagnosis Date Noted  . Malnutrition of moderate degree 08/24/2017  . Cancer associated pain   . Palliative care encounter   . Hypoxia 08/20/2017  . Acute respiratory failure with hypoxia ([place]) 08/20/2017  . COPD (chronic obstructive pulmonary disease) ([place]) 08/20/2017  . Constipation 08/14/2017  . Malignant tumor, spindle cell type ([place]) 07/16/2017  . Goals of care, counseling/discussion 07/16/2017  . Chronic low back pain 07/04/2017  . Bone metastasis ([place]) 06/23/2017  . COPD with acute exacerbation ([place]) 05/15/2017  . Multiple lung nodules on CT   . Near syncope 05/03/2017  . Lung mass 05/03/2017  . Hyperlipidemia 09/20/2016  . Protein-calorie malnutrition  ([place]) 08/17/2016  . Thoracic compression fracture, closed, initial encounter ([place]) 08/17/2016  . Alcohol dependence ([place]) 08/17/2016  . Tobacco dependence 08/17/2016  . Osteoporosis 08/17/2016  . Hip fracture ([place]) 08/16/2016  . Headache 02/24/2016  . Hypertensive urgency 02/03/2016  . Shortness of breath 07/28/2015  . Generalized anxiety disorder 07/28/2015  . Medicare annual wellness visit, subsequent 05/24/2015  . Acute DVT of right tibial vein ([place]) 02/27/2015  . Tibia fracture 02/20/2015  . Syncope 02/18/2015  . Essential hypertension 02/18/2015  . History of stroke 02/18/2015  . Fracture of tibia, right, closed   . C. difficile colitis 09/11/2013  . UTI (urinary tract infection) 09/11/2013  . Essential hypertension, benign 09/11/2013  . Ileus ([place]) 09/10/2013  . Spastic hemiplegia affecting dominant side ([place]) 09/09/2013  . Ileus, postoperative ([place]) 09/09/2013  . Hypokalemia 09/09/2013  . Nonspecific (abnormal) findings on radiological and other examination of gastrointestinal tract 09/09/2013  . CVA (cerebral infarction) 09/05/2013  . 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 Malignant neoplasm of prostate ([place]) 08/29/2013  . Hyponatremia 12/11/2012    Class: Chronic  . Ankle fracture 12/10/2012    Palliative Care Assessment & Plan   HPI: 62 y.o. 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 Palliative care requested for assistance in pain management.   Recommendations/Plan:  Bone mets pain: Reports pain "OK" today following discontinuation of narcotics last evening due to sedation.  He also received Narcan x 2 doses.  We discussed at length the risk versus benefit of narcotic  medications and my concern that we may be may need to be readdress long  term goals as I worry about adequately controlling his pain without causing risk of further sedation.  He reports that he would like to continue with currently ordered regimen (no opioids) and see how things progress over the next day or 2.       Continue decadron 8 mg po TID for now.   Lyrica 75 mg increased to 150 po BID.   He is not interested in pursuing any further radiation even if indicated.   Goals of Care and Additional Recommendations:  Limitations on Scope of Treatment: Full Scope Treatment- Reports that he has grandchild due in April. Asking today "how much time left" as he wants to see grandchild born  Code Status:  DNR   Prognosis:   Extremely poor with progressing advanced cancer and worsening pain and functional status. Certainly eligible for hospice is forego further treatment.   Discharge Planning:  To Be Determined. Likely home with his ex-wife, home health, and outpatient palliative follow up.   Care plan was discussed with Dr. Florene Glen, RN  Thank you for allowing the Palliative Medicine Team to assist in the care of this patient.   Total Time 30 min Prolonged Time Billed  no       Greater than 50%  of this time was spent counseling and coordinating care related to the above assessment and plan.  Micheline Rough, MD [facility] [phone]

## 2017-08-26 NOTE — Progress Notes (Addendum)
PROGRESS NOTE    Tanner [name]  EVO:[id number] DOB: 1956/05/09 DOA: 08/20/2017 PCP: Tanner Sell, NP   Brief Narrative:  Tanner [name] 62 y.o.malewith medical history significant ofrecently diagnosed stage IV cancer of unknown primary distorted chemo 3 weeks ago for palliative treatment comes in with shortness of breath. Family reports that he has pain is been difficult to control at home he is got multiple bony lytic lesions with rib fractures. They just adjusted his pain medications last week. He has been short of breath at home and coughing.  Assessment & Plan:   Principal Problem:   Acute respiratory failure with hypoxia (HCC) Active Problems:   Essential hypertension, benign   Generalized anxiety disorder   Bone metastasis (HCC)   Malignant tumor, spindle cell type (HCC)   Hypoxia   COPD (chronic obstructive pulmonary disease) (HCC)   Cancer associated pain   Palliative care encounter   Malnutrition of moderate degree   Acute Hypoxic Respiratory Failure  - 2/16 transferred to ICU for somnolence and increased O2 requirement - Currently on HFNC ~10 L - CXR stable - suspect this is oversedation 2/2 narcotics  - repeat CT chest - Duonebs q6, prn albuterol, receiving steroids - Encourage IS - CT scan at presentation without PE, notable for metastatic disease and bony mets and pathologic rib fractures - has recent echo that showed normal EF, grade 1 diastolic dysfunction, does not appear overloaded on exam -Suspect multifactorial due to poor inspiratory effort in the setting of multiple rib metastasis and COPD, this acute worsening was suspected to be due to narcotics, but infection possible -Pain control, supplemental oxygen  Acute Encephalopathy  Hypotension  Possible Sepsis:  - noted 2/16, pt transferred to ICU - IVF - suspect all of these sx could be due to narcotics - for now, broad spectrum abx (vanc/zosyn) - follow urine and blood cx (UA not c/w  infection) - follow PCT - Repeat CT chest as well as abdomen and pelvis  Metastatic spindle cell cancer -Stage IV, poorly differentiated spindle cell neoplasm, status post palliative radiotherapy, currently on systemic chemotherapy  - Appreciate palliative assistance (holding pain meds for now) - can restart short acting once patient stable from respiratory and blood pressure status - lyrica as well as decadron 8 po TID   - voltaren (d'c'd), lidocaine patch added for pain as well -Consulted palliative care for pain control, appreciate input - patient is now DNR - PT to eval, currently recommending SNF - will need to continue to readdress goals of care going forward with acute worsening  COPD -continue duo nebs as needed -Possibly he has Tanner [name] degree of chronic respiratory failure  Metabolic Alkalosis: VBG this morning with mild resp alkalosis and mild metabolic alkalosis.  Normal CO2 on BMP.  Continue to monitor.   Hypertension -hold metoprolol, hold amlodipine.  Recently hypotensive  R Shoulder Pain - negative plain films, continue to monitor  Hyperlipidemia -Continue statin  Anemia  Leukopenia: low WBC improved.  Anemia stable, slightly downtrended, likely dilutional  DVT prophylaxis: lovenox Code Status: DNR Family Communication: family at bedside on 2/16 Disposition Plan: pending   Consultants:   Palliative care  Procedures:   none  Antimicrobials:   none    Subjective: Doing ok this morning.  In pain with movement  Objective: Vitals:   08/26/17 1000 08/26/17 1030 08/26/17 1100 08/26/17 1200  BP: 124/77 131/87 (!) 138/93 123/63  Pulse: 89 86 91 99  Resp: 13 14 16 16   Temp:  TempSrc:      SpO2: 96% 100% 97% 96%  Weight:      Height:        Intake/Output Summary (Last 24 hours) at 08/26/2017 1402 Last data filed at 08/26/2017 1100 Gross per 24 hour  Intake 3267.5 ml  Output 350 ml  Net 2917.5 ml   Filed Weights   08/20/17 1824 08/24/17  1556 08/25/17 0406  Weight: 68 kg (150 lb) 67.2 kg (148 lb 2.4 oz) 68.4 kg (150 lb 12.7 oz)    Examination:  General: No acute distress. Cardiovascular: Heart sounds show Tanner [name] regular rate, and rhythm. No gallops or rubs. No murmurs. No JVD. Lungs: Decreased breath sounds.  [place] in place, HFNC 10 L Abdomen: Soft, mild abdominal tenderness, nondistended with normal active bowel sounds. No masses. No hepatosplenomegaly. Neurological: Alert and oriented 3. Moves all extremities 4. Cranial nerves II through XII grossly intact. Skin: Warm and dry. No rashes or lesions. Extremities: No clubbing or cyanosis. No edema.  Psychiatric: Mood and affect are normal. Insight and judgment are appropriate.   Data Reviewed: I have personally reviewed following labs and imaging studies  CBC: Recent Labs  Lab 08/20/17 1851  08/23/17 0439 08/24/17 0439 08/25/17 0352 08/25/17 1802 08/26/17 0337  WBC 2.2*   < > 5.4 7.5 8.4 11.1* 9.9  NEUTROABS 1.2*  --   --   --   --   --   --   HGB 8.7*   < > 9.1* 9.5* 9.3* 10.0* 8.7*  HCT 25.2*   < > 27.6* 28.4* 28.8* 30.3* 26.9*  MCV 97.7   < > 98.2 98.3 98.3 98.7 98.9  PLT 214   < > 273 260 261 236 209   < > = values in this interval not displayed.   Basic Metabolic Panel: Recent Labs  Lab 08/23/17 0439 08/24/17 0439 08/25/17 0352 08/25/17 1802 08/26/17 0337  NA 136 135 136 133* 134*  K 4.7 4.5 4.3 4.7 4.2  CL 94* 95* 93* 92* 99*  CO2 30 28 33* 31 27  GLUCOSE 136* 151* 128* 127* 174*  BUN 13 21* 26* 29* 27*  CREATININE 0.60* 0.60* 0.64 0.76 0.70  CALCIUM 9.5 9.5 9.4 9.2 8.4*  MG  --   --  2.0  --   --    GFR: Estimated Creatinine Clearance: 96.2 mL/min (by C-G formula based on SCr of 0.7 mg/dL). Liver Function Tests: Recent Labs  Lab 08/20/17 1851 08/23/17 0439  AST 21 17  ALT 13* 11*  ALKPHOS 94 87  BILITOT 0.2* 0.2*  PROT 6.4* 6.7  ALBUMIN 2.4* 2.4*   No results for input(s): LIPASE, AMYLASE in the last 168 hours. No results for  input(s): AMMONIA in the last 168 hours. Coagulation Profile: Recent Labs  Lab 08/25/17 2250  INR 1.12   Cardiac Enzymes: No results for input(s): CKTOTAL, CKMB, CKMBINDEX, TROPONINI in the last 168 hours. BNP (last 3 results) No results for input(s): PROBNP in the last 8760 hours. HbA1C: No results for input(s): HGBA1C in the last 72 hours. CBG: No results for input(s): GLUCAP in the last 168 hours. Lipid Profile: No results for input(s): CHOL, HDL, LDLCALC, TRIG, CHOLHDL, LDLDIRECT in the last 72 hours. Thyroid Function Tests: No results for input(s): TSH, T4TOTAL, FREET4, T3FREE, THYROIDAB in the last 72 hours. Anemia Panel: No results for input(s): VITAMINB12, FOLATE, FERRITIN, TIBC, IRON, RETICCTPCT in the last 72 hours. Sepsis Labs: Recent Labs  Lab 08/20/17 2309 08/25/17 2018 08/25/17 2250 08/26/17  0400  PROCALCITON  --  0.28  --  0.30  LATICACIDVEN 0.39* 3.4* 1.6  --     Recent Results (from the past 240 hour(s))  Culture, blood (x 2)     Status: None (Preliminary result)   Collection Time: 08/25/17  8:18 PM  Result Value Ref Range Status   Specimen Description BLOOD RIGHT ANTECUBITAL  Final   Special Requests IN PEDIATRIC BOTTLE Blood Culture adequate volume  Final   Culture PENDING  Incomplete   Report Status PENDING  Incomplete  Culture, blood (x 2)     Status: None (Preliminary result)   Collection Time: 08/25/17  8:18 PM  Result Value Ref Range Status   Specimen Description   Final    BLOOD RIGHT HAND Performed at [facility] [address] [place], [place] [address]    Special Requests   Final    BOTTLES DRAWN AEROBIC ONLY Blood Culture adequate volume   Culture PENDING  Incomplete   Report Status PENDING  Incomplete         Radiology Studies: Dg Chest Port 1 View  Result Date: 08/25/2017 CLINICAL DATA:  Right-sided chest pain with deep breathing. Shortness of breath and cough, recently diagnosed with stage IV cancer.  History of hypertension. EXAM: PORTABLE CHEST 1 VIEW COMPARISON:  Chest x-ray from earlier same day and chest x-ray dated 08/20/2017. Chest CT dated 08/20/2017. FINDINGS: Cardiomediastinal silhouette is stable in size and configuration. Again noted are the known chest wall metastases bilaterally. No new lung findings to suggest Dione Mccombie developing pneumonia. No pleural effusion or pneumothorax seen. IMPRESSION: Stable chest x-ray. No evidence of pneumonia or pulmonary edema. Known chest wall metastases again noted bilaterally. Electronically Signed   By: Franki Cabot M.D.   On: 08/25/2017 18:05   Dg Chest Port 1 View  Result Date: 08/25/2017 CLINICAL DATA:  Shortness of breath and hypoxia. Metastatic bone disease. EXAM: PORTABLE CHEST 1 VIEW COMPARISON:  08/20/2017 and CT 08/20/2017 FINDINGS: Lungs are adequately inflated without focal airspace consolidation or effusion. Evidence of patient's known metastatic lesions over the right lateral thorax and posterolateral right chest wall. Cardiomediastinal silhouette and remainder of the exam is unchanged. IMPRESSION: No acute cardiopulmonary disease. Known chest wall metastatic disease. Electronically Signed   By: Marin Olp M.D.   On: 08/25/2017 13:52        Scheduled Meds: . dexamethasone  8 mg Oral Q8H  . diclofenac sodium  2 g Topical QID  . docusate sodium  100 mg Oral BID  . feeding supplement (ENSURE ENLIVE)  237 mL Oral TID BM  . guaiFENesin  600 mg Oral BID  . ipratropium-albuterol  3 mL Nebulization Q6H  . lidocaine  1 patch Transdermal Q24H  . multivitamin with minerals  1 tablet Oral Daily  . pantoprazole  40 mg Oral Daily  . polyethylene glycol  17 g Oral Daily  . pregabalin  150 mg Oral BID  . PRUTECT  1 application Topical BID  . senna  2 tablet Oral BID  . simvastatin  20 mg Oral q1800  . sodium chloride flush  3 mL Intravenous Q12H  . thiamine  100 mg Oral Daily   Continuous Infusions: . sodium chloride    . sodium chloride 75  mL/hr at 08/26/17 0700  . piperacillin-tazobactam (ZOSYN)  IV 3.375 g (08/26/17 1344)  . vancomycin Stopped (08/26/17 0628)     LOS: 5 days    Time spent: over 30 min    Marcelline Deist  Florene Glen, MD [facility] Pager [phone]  If 7PM-7AM, please contact night-coverage [email or web address] Password [email or web address] 08/26/2017, 2:02 PM

## 2017-08-27 ENCOUNTER — Other Ambulatory Visit: Payer: Self-pay

## 2017-08-27 ENCOUNTER — Inpatient Hospital Stay (HOSPITAL_COMMUNITY): Payer: Medicare HMO

## 2017-08-27 ENCOUNTER — Ambulatory Visit: Payer: Self-pay

## 2017-08-27 ENCOUNTER — Ambulatory Visit: Payer: Self-pay | Admitting: Internal Medicine

## 2017-08-27 ENCOUNTER — Ambulatory Visit
Admit: 2017-08-27 | Discharge: 2017-08-27 | Disposition: A | Discharge status: Home / Self Care | Payer: Medicare HMO | Source: Ambulatory Visit | Attending: Radiation Oncology | Admitting: Radiation Oncology

## 2017-08-27 DIAGNOSIS — C801 Malignant (primary) neoplasm, unspecified: Secondary | ICD-10-CM

## 2017-08-27 LAB — BASIC METABOLIC PANEL
Anion gap: 9 (ref 5–15)
BUN: 23 mg/dL — AB (ref 6–20)
CHLORIDE: 100 mmol/L — AB (ref 101–111)
CO2: 27 mmol/L (ref 22–32)
Calcium: 8.6 mg/dL — ABNORMAL LOW (ref 8.9–10.3)
Creatinine, Ser: 0.65 mg/dL (ref 0.61–1.24)
GFR calc Af Amer: >60 mL/min (ref >60)
GLUCOSE: 154 mg/dL — AB (ref 65–99)
POTASSIUM: 4.1 mmol/L (ref 3.5–5.1)
Sodium: 136 mmol/L (ref 135–145)

## 2017-08-27 LAB — MAGNESIUM: MAGNESIUM: 1.8 mg/dL (ref 1.7–2.4)

## 2017-08-27 LAB — CBC
HCT: 23.9 % — ABNORMAL LOW (ref 39.0–52.0)
Hemoglobin: 8.2 g/dL — ABNORMAL LOW (ref 13.0–17.0)
MCH: 33.5 pg (ref 26.0–34.0)
MCHC: 34.3 g/dL (ref 30.0–36.0)
MCV: 97.6 fL (ref 78.0–100.0)
PLATELETS: 184 10*3/uL (ref 150–400)
RBC: 2.45 MIL/uL — AB (ref 4.22–5.81)
RDW: 13.7 % (ref 11.5–15.5)
WBC: 9.7 10*3/uL (ref 4.0–10.5)

## 2017-08-27 LAB — PROCALCITONIN: Procalcitonin: 0.22 ng/mL

## 2017-08-27 LAB — HEMOGLOBIN AND HEMATOCRIT, BLOOD
HEMATOCRIT: 25.2 % — AB (ref 39.0–52.0)
HEMOGLOBIN: 8.5 g/dL — AB (ref 13.0–17.0)

## 2017-08-27 LAB — URINE CULTURE: CULTURE: NO GROWTH

## 2017-08-27 MED ORDER — OXYCODONE HCL 5 MG PO TABS
15.0000 mg | ORAL_TABLET | Freq: Four times a day (QID) | ORAL | Status: DC | PRN
  Administered 2017-08-27 – 2017-08-28 (×4): 15 mg via ORAL
  Filled 2017-08-27 (×4): qty 3

## 2017-08-27 MED ORDER — ACETAMINOPHEN 500 MG PO TABS
1000.0000 mg | ORAL_TABLET | Freq: Three times a day (TID) | ORAL | Status: DC
  Administered 2017-08-27 – 2017-09-04 (×24): 1000 mg via ORAL
  Filled 2017-08-27 (×25): qty 2

## 2017-08-27 MED ORDER — LORAZEPAM 2 MG/ML IJ SOLN
0.5000 mg | Freq: Once | INTRAMUSCULAR | Status: AC
Start: 2017-08-27 — End: 2017-08-27
  Administered 2017-08-27: 0.5 mg via INTRAVENOUS
  Filled 2017-08-27: qty 1

## 2017-08-27 MED ORDER — GERHARDT'S BUTT CREAM
TOPICAL_CREAM | Freq: Three times a day (TID) | CUTANEOUS | Status: DC | PRN
  Administered 2017-08-27 – 2017-08-28 (×2): via TOPICAL
  Filled 2017-08-27: qty 1

## 2017-08-27 MED ORDER — DIPHENHYDRAMINE HCL 25 MG PO CAPS
25.0000 mg | ORAL_CAPSULE | Freq: Once | ORAL | Status: AC
  Administered 2017-08-27: 25 mg via ORAL
  Filled 2017-08-27: qty 1

## 2017-08-27 MED ORDER — FENTANYL CITRATE (PF) 100 MCG/2ML IJ SOLN
50.0000 ug | INTRAMUSCULAR | Status: DC | PRN
  Administered 2017-08-27: 50 ug via INTRAVENOUS
  Filled 2017-08-27: qty 2

## 2017-08-27 MED ORDER — FENTANYL CITRATE (PF) 100 MCG/2ML IJ SOLN
75.0000 ug | INTRAMUSCULAR | Status: DC | PRN
  Administered 2017-08-27 – 2017-08-28 (×5): 75 ug via INTRAVENOUS
  Filled 2017-08-27 (×5): qty 2

## 2017-08-27 MED ORDER — FENTANYL CITRATE (PF) 100 MCG/2ML IJ SOLN
50.0000 ug | Freq: Once | INTRAMUSCULAR | Status: AC
  Administered 2017-08-27: 50 ug via INTRAVENOUS
  Filled 2017-08-27: qty 2

## 2017-08-27 MED ORDER — DRONABINOL 2.5 MG PO CAPS: 5.0000 mg | ORAL_CAPSULE | Freq: Two times a day (BID) | ORAL | Status: DC

## 2017-08-27 MED ORDER — IPRATROPIUM-ALBUTEROL 0.5-2.5 (3) MG/3ML IN SOLN
3.0000 mL | Freq: Three times a day (TID) | RESPIRATORY_TRACT | Status: DC
  Administered 2017-08-27 – 2017-08-28 (×2): 3 mL via RESPIRATORY_TRACT
  Filled 2017-08-27 (×3): qty 3

## 2017-08-27 NOTE — Progress Notes (Signed)
Daily Progress Note   Patient Name: Tanner [name]       Date: 08/27/2017 DOB: 08-May-1956  Age: 62 y.o. MRN#: [id number] Attending Physician: Elodia Florence., * Primary Care Physician: [name], NP Admit Date: 08/20/2017  Reason for Consultation/Follow-up: Pain control  Subjective: Received page from RN that pain is uncontrolled again today.  I met today with Tanner [name], his daughter, sister, and ex-wife.  We discussed pain management including his current pain regimen and the fact that he is hurting again today.  I reviewed with him and his family risk vs benefit of pain medications and concern that his sleepiness was caused by his pain medications.  We discussed need for adequate pain control and plan to reintroduce pain medications.  At this point, will focus on use of short acting medications to try to get a better handle on his pain, then work to reintroduce long acting agents if appropriate.  We also discussed that his disease continues to progress, and I am concerned that he is going to continue to decline quickly regardless of interventions moving forward.  Family was appropriately tearful during discussion and seemed to understand situation.  They are hopeful to have Dr. Worthy Flank input as well.  Length of Stay: 6  Current Medications: Scheduled Meds:  . acetaminophen  1,000 mg Oral Q8H  . dexamethasone  8 mg Oral Q8H  . diclofenac sodium  2 g Topical QID  . docusate sodium  100 mg Oral BID  . enoxaparin (LOVENOX) injection  40 mg Subcutaneous Daily  . feeding supplement (ENSURE ENLIVE)  237 mL Oral TID BM  . guaiFENesin  600 mg Oral BID  . ipratropium-albuterol  3 mL Nebulization Q6H  . lidocaine  1 patch Transdermal Q24H  . multivitamin with minerals  1 tablet  Oral Daily  . pantoprazole  40 mg Oral Daily  . polyethylene glycol  17 g Oral Daily  . pregabalin  150 mg Oral BID  . PRUTECT  1 application Topical BID  . senna  2 tablet Oral BID  . simvastatin  20 mg Oral q1800  . sodium chloride flush  3 mL Intravenous Q12H  . thiamine  100 mg Oral Daily    Continuous Infusions: . sodium chloride    . piperacillin-tazobactam (ZOSYN)  IV 3.375 g (08/27/17  1311)  . vancomycin Stopped (08/27/17 0727)    PRN Meds: sodium chloride, albuterol, bisacodyl, diazepam, fentaNYL (SUBLIMAZE) injection, naLOXone (NARCAN)  injection, ondansetron (ZOFRAN) IV, ondansetron, oxyCODONE, sodium chloride flush, traZODone  Physical Exam  Constitutional: He is oriented to person, place, and time. He appears well-developed. He appears ill.  HENT:  Head: Normocephalic and atraumatic.  Cardiovascular: Normal rate and regular rhythm.  Pulmonary/Chest: No accessory muscle usage. No tachypnea. No respiratory distress.  Oxygen needs decreasing  Abdominal: Normal appearance.  Neurological: He is alert and oriented to person, place, and time.  Nursing note and vitals reviewed.           Vital Signs: BP 99/69   Pulse (!) 117   Temp 99 F (37.2 C) (Oral)   Resp 18   Ht 5' 10"  (1.778 m)   Wt 68.4 kg (150 lb 12.7 oz)   SpO2 95%   BMI 21.64 kg/m  SpO2: SpO2: 95 % O2 Device: O2 Device: Nasal Cannula O2 Flow Rate: O2 Flow Rate (L/min): 2 L/min  Intake/output summary:   Intake/Output Summary (Last 24 hours) at 08/27/2017 1358 Last data filed at 08/27/2017 2924 Gross per 24 hour  Intake 1450 ml  Output 925 ml  Net 525 ml   LBM: Last BM Date: 08/25/17 Baseline Weight: Weight: 68 kg (150 lb) Most recent weight: Weight: 68.4 kg (150 lb 12.7 oz)       Palliative Assessment/Data: 30%      Patient Active Problem List   Diagnosis Date Noted  . Malnutrition of moderate degree 08/24/2017  . Cancer associated pain   . Palliative care encounter   . Hypoxia  08/20/2017  . Acute respiratory failure with hypoxia ([place]) 08/20/2017  . COPD (chronic obstructive pulmonary disease) ([place]) 08/20/2017  . Constipation 08/14/2017  . Malignant tumor, spindle cell type ([place]) 07/16/2017  . Goals of care, counseling/discussion 07/16/2017  . Chronic low back pain 07/04/2017  . Bone metastasis ([place]) 06/23/2017  . COPD with acute exacerbation ([place]) 05/15/2017  . Multiple lung nodules on CT   . Near syncope 05/03/2017  . Lung mass 05/03/2017  . Hyperlipidemia 09/20/2016  . Protein-calorie malnutrition ([place]) 08/17/2016  . Thoracic compression fracture, closed, initial encounter ([place]) 08/17/2016  . Alcohol dependence ([place]) 08/17/2016  . Tobacco dependence 08/17/2016  . Osteoporosis 08/17/2016  . Hip fracture ([place]) 08/16/2016  . Headache 02/24/2016  . Hypertensive urgency 02/03/2016  . Shortness of breath 07/28/2015  . Generalized anxiety disorder 07/28/2015  . Medicare annual wellness visit, subsequent 05/24/2015  . Acute DVT of right tibial vein ([place]) 02/27/2015  . Tibia fracture 02/20/2015  . Syncope 02/18/2015  . Essential hypertension 02/18/2015  . History of stroke 02/18/2015  . Fracture of tibia, right, closed   . C. difficile colitis 09/11/2013  . UTI (urinary tract infection) 09/11/2013  . Essential hypertension, benign 09/11/2013  . Ileus ([place]) 09/10/2013  . Spastic hemiplegia affecting dominant side ([place]) 09/09/2013  . Ileus, postoperative ([place]) 09/09/2013  . Hypokalemia 09/09/2013  . Nonspecific (abnormal) findings on radiological and other examination of gastrointestinal tract 09/09/2013  . CVA (cerebral infarction) 09/05/2013  . 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 Malignant neoplasm of prostate ([employer]) 08/29/2013  . Hyponatremia 12/11/2012    Class: Chronic  . Ankle fracture 12/10/2012    Palliative Care Assessment & Plan   HPI: 62 y.o. 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 Palliative care requested for assistance in pain management.   Recommendations/Plan:  Bone mets pain: Reports pain much worse following discontinuation of narcotics Discussed with family regarding risk versus benefit of narcotic medications and my concern that we may be may need to be readdress long term goals as I worry about adequately controlling his pain without causing risk of further sedation.  Also discussed concern that we may be at the point where further disease modifying therapy will not add time and quality to his life.  Family appropriately tearful and would appreciate Dr. Worthy Flank input once he has opportunity to review most recent scan.  For pain, will plan for fentanyl 49mg Q 2 hours as needed.  This is conservative dosing vs prior regimen.  His RN did call and report pain not adequately controlled one hour after fentanyl. I therefore gave another 597m dose at that time and increased fentanyl to 7553mas needed every 2 hours.  Continue decadron 8 mg po TID for now.   Lyrica 75 mg increased to 150 po BID.   Code Status:  DNR   Prognosis:   Extremely poor with progressing advanced cancer and worsening pain and functional status. Certainly eligible for hospice is forego further treatment.   Discharge Planning:  To Be Determined. Likely home with his ex-wife, home health, and outpatient palliative follow up.   Care plan was discussed with Dr. PowFlorene GlenN  Thank you for allowing the Palliative Medicine Team to assist in the care of this patient.   Total Time 40 min Prolonged Time Billed  no       Greater than 50%  of this time was spent counseling and coordinating care related to the above assessment and plan.  GenMicheline RoughD [place] [phone]

## 2017-08-27 NOTE — Consult Note (Signed)
Reason for Consult: evaluate Orthopaedic significance of metastatic lesion to left proximal femur Referring Physician:  Florene Glen, MD (Hospitalist)  Tanner [name] is an 62 y.o. male.  HPI: 62 year old male admitted to [facility] February 11.  This was due to increasing pain predominantly in his thoracic region and axial spine.  He was seen and evaluated today with his wife and daughter at bedside.  They report to me a history of prostate cancer howeve he has been diagnosed recently as having a metastatic carcinoma of unknown primary origin.  Apparently biopsy attempts have been unable to identify a primary source of his recurrent metastasis.  The hospitalist service asked [place] to evaluate the significance of findings on the CT scan of his abdomen and pelvis specifically that involving the left proximal femur.  Upon review he has been weightbearing as tolerated with use of a walker and did not have primary complaints of left hip pain.  Instead the predominance of his pain is in his axial skeleton thoracic and lumbar.  He has noted some bilateral thigh pains recently but nothing specific.  He has a history of a right femur fracture a year ago as a result of a fall.  The current treatment for his cancer has included palliative radiation treatment of his lumbar spine as well as a round of chemotherapy.  At this point despite these attempts at treatment the metastatic lesions appear to be enlarging based on the CT scan evaluation.   Past Medical History:  Diagnosis Date  . Alcohol abuse   . Alcoholic hepatitis   . Allergy   . Anxiety   . 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 C. difficile colitis   . Depression   . DVT (deep venous thrombosis) ([place]) 2015  . GERD (gastroesophageal reflux disease)   . Hypertension   . Ileus ([place]) 09/2013  . Prostate cancer ([place]) 05/22/13   Gleason 4+3=7  . Shortness of breath    new onset- occasionally-at rest and exertion  . Substance abuse ([place])   . TIA (transient  ischemic attack) 08/2013    Past Surgical History:  Procedure Laterality Date  . HAND SURGERY Right   . HERNIA REPAIR    . INTRAMEDULLARY (IM) NAIL INTERTROCHANTERIC Right 08/17/2016   Procedure: INTRAMEDULLARY (IM) NAIL RIGHT HIP;  Surgeon: Tania Ade, MD;  Location: [facility];  Service: Orthopedics;  Laterality: Right;  . LYMPHADENECTOMY Bilateral 08/29/2013   Procedure: LYMPHADENECTOMY "BILATERAL PELVIC LYMPH NODE DISSECTION";  Surgeon: Molli Hazard, MD;  Location: [facility];  Service: Urology;  Laterality: Bilateral;  . ORIF ANKLE FRACTURE Right 12/10/2012   Procedure: OPEN REDUCTION INTERNAL FIXATION (ORIF) ANKLE FRACTURE;  Surgeon: Hessie Dibble, MD;  Location: [facility];  Service: Orthopedics;  Laterality: Right;  . ROBOT ASSISTED LAPAROSCOPIC RADICAL PROSTATECTOMY N/A 08/29/2013   Procedure: ROBOTIC ASSISTED LAPAROSCOPIC RADICAL PROSTATECTOMY LEVEL 2, Lysis of adhesions;  Surgeon: Molli Hazard, MD;  Location: [facility];  Service: Urology;  Laterality: N/A;    Family History  Problem Relation Age of Onset  . Lung cancer Father        smoker  . COPD Father   . Emphysema Father   . Arthritis Mother   . Pancreatic cancer Sister     Social History:  reports that he quit smoking about 5 months ago. His smoking use included cigarettes. He has a 45.00 pack-year smoking history. he has never used smokeless tobacco. He reports that he drinks about 3.0 - 6.0  oz of alcohol per week. He reports that he uses drugs. Drug: Marijuana.  Allergies:  Allergies  Allergen Reactions  . Dilaudid [Hydromorphone Hcl] Nausea And Vomiting    Pre pt history  . Procaine Hcl Other (See Comments)    08/10/2017: The patient indicated that Novocaine was used by a dermatologist prior to obtaining biopsy on his back "many years ago". Patient experienced a syncopal reaction by his report. The patient has since had dental anesthetic without any adverse reactions.   . Sulfa Antibiotics Rash     Medications:  I have reviewed the patient's current medications. Scheduled: . acetaminophen  1,000 mg Oral Q8H  . dexamethasone  8 mg Oral Q8H  . diclofenac sodium  2 g Topical QID  . docusate sodium  100 mg Oral BID  . enoxaparin (LOVENOX) injection  40 mg Subcutaneous Daily  . feeding supplement (ENSURE ENLIVE)  237 mL Oral TID BM  . guaiFENesin  600 mg Oral BID  . ipratropium-albuterol  3 mL Nebulization Q6H  . lidocaine  1 patch Transdermal Q24H  . multivitamin with minerals  1 tablet Oral Daily  . pantoprazole  40 mg Oral Daily  . polyethylene glycol  17 g Oral Daily  . pregabalin  150 mg Oral BID  . PRUTECT  1 application Topical BID  . senna  2 tablet Oral BID  . simvastatin  20 mg Oral q1800  . sodium chloride flush  3 mL Intravenous Q12H  . thiamine  100 mg Oral Daily    Results for orders placed or performed during the hospital encounter of 08/20/17 (from the past 24 hour(s))  MRSA PCR Screening     Status: None   Collection Time: 08/26/17 10:19 PM  Result Value Ref Range   MRSA by PCR NEGATIVE NEGATIVE  Procalcitonin     Status: None   Collection Time: 08/27/17  3:12 AM  Result Value Ref Range   Procalcitonin 0.22 ng/mL  CBC     Status: Abnormal   Collection Time: 08/27/17  3:12 AM  Result Value Ref Range   WBC 9.7 4.0 - 10.5 K/uL   RBC 2.45 (L) 4.22 - 5.81 MIL/uL   Hemoglobin 8.2 (L) 13.0 - 17.0 g/dL   HCT 23.9 (L) 39.0 - 52.0 %   MCV 97.6 78.0 - 100.0 fL   MCH 33.5 26.0 - 34.0 pg   MCHC 34.3 30.0 - 36.0 g/dL   RDW 13.7 11.5 - 15.5 %   Platelets 184 150 - 400 K/uL  Basic metabolic panel     Status: Abnormal   Collection Time: 08/27/17  3:12 AM  Result Value Ref Range   Sodium 136 135 - 145 mmol/L   Potassium 4.1 3.5 - 5.1 mmol/L   Chloride 100 (L) 101 - 111 mmol/L   CO2 27 22 - 32 mmol/L   Glucose, Bld 154 (H) 65 - 99 mg/dL   BUN 23 (H) 6 - 20 mg/dL   Creatinine, Ser 0.65 0.61 - 1.24 mg/dL   Calcium 8.6 (L) 8.9 - 10.3 mg/dL   GFR calc non Af  Amer >60 >60 mL/min   GFR calc Af Amer >60 >60 mL/min   Anion gap 9 5 - 15  Magnesium     Status: None   Collection Time: 08/27/17  3:12 AM  Result Value Ref Range   Magnesium 1.8 1.7 - 2.4 mg/dL  Hemoglobin and hematocrit, blood     Status: Abnormal   Collection Time: 08/27/17  2:43 PM  Result Value Ref Range   Hemoglobin 8.5 (L) 13.0 - 17.0 g/dL   HCT 25.2 (L) 39.0 - 52.0 %    X-ray: CLINICAL DATA:  Stage IV cancer of unknown primary.  Abdominal pain.  EXAM: CT ABDOMEN AND PELVIS WITH CONTRAST  TECHNIQUE: Multidetector CT imaging of the abdomen and pelvis was performed using the standard protocol following bolus administration of intravenous contrast.  CONTRAST:  162mL ISOVUE-370 IOPAMIDOL (ISOVUE-370) INJECTION 76%  COMPARISON:  05/04/17.  FINDINGS: Lower chest: Dense airspace consolidation within the posterior and medial right lung base identified. Small left pleural effusion identified.  Hepatobiliary: Progressive multifocal liver metastases identified. Index lesion within the posterior right lobe of liver measures 3.2 cm, image 19 of series 4. New from previous exam. Inferior right lobe of liver metastasis is also new measuring 2.4 cm, image 35 of series 4. Anterior right lobe of liver metastasis measures 2.0 cm, image 29 of series 4. New from previous exam. Gallbladder appears normal. No biliary dilatation.  Pancreas: Normal appearance of the pancreas.  Spleen: The spleen is unremarkable.  Adrenals/Urinary Tract: Normal appearance of both the adrenal glands. Bilateral kidney cysts are again noted. No mass or hydronephrosis visualized. Urinary bladder appears normal.  Stomach/Bowel: The stomach is normal. The small bowel loops have a normal course and caliber without evidence for bowel obstruction. No pathologic dilatation of the colon.  Vascular/Lymphatic: Aortic atherosclerosis. No aneurysm. No abdominal adenopathy. No pelvic or inguinal  adenopathy.  Reproductive: Previous prostatectomy.  Other: Small supraumbilical ventral abdominal wall hernia contains fat only. Trace free fluid noted within the pelvis.  Musculoskeletal: Multifocal lytic bone metastases are identified. Progressed from previous exam. Bilateral expansile rib lesions are identified, new from previous exam. Left posterolateral ninth rib lesion measures 3.6 x 2.0 cm, image 15 of series 4. Left posterior eighth rib expansile lesion measures 7 x 4.1 cm. Extensive lytic bone metastases are noted throughout the visualized portions of the thoracic and lumbar spine. Increased from prior exam. L2 lytic lesion with pathologic compression fracture measures 1.7 cm, image 68 of series 8. New from previous exam. Large lytic lesion within the T8 vertebra with associated superior endplate pathologic fracture is new measuring 1.9 cm, image number 68 of series 8. S1 lytic lesion measures 2.2 cm, image 69 of series 8. The previously 0.8 cm. Enlarging lytic lesion involving the posterior cortex of the proximal left femur may be of orthopedic significance measuring 2.5 cm, image 90 of series 4. Previously 0.8 cm. Also of potential orthopedic significance are bilateral inferior pubic rami lytic lesions.  IMPRESSION: 1. Interval development of multifocal liver metastasis. 2. Significant interval progression of multifocal lytic bone metastases involving the spine, bony pelvis and left femur. Associated pathologic fractures are noted within the lumbar spine. Lesions involving the left femur and pelvis may be of orthopedic significance.  CLINICAL DATA:  Left hip pain without known injury.  EXAM: DG HIP (WITH OR WITHOUT PELVIS) 2-3V LEFT  COMPARISON:  None.  FINDINGS: There is no evidence of hip fracture or dislocation. There is no evidence of arthropathy or other focal bone abnormality.  IMPRESSION: Normal left hip.   Electronically Signed   By: Marijo Conception, M.D.   On: 08/27/2017 12:10  ROS  Reviewed from his admitting history and physical and noted to be otherwise normal other than that reported in his admitting HPI.  The predominant problem at this point is axial skeletal pain.  Blood pressure 138/89, pulse 94, temperature 99 F (37.2  C), temperature source Oral, resp. rate 16, height 5\' 10"  (1.778 m), weight 68.4 kg (150 lb 12.7 oz), SpO2 95 %.  Physical Exam  Awake alert and oriented.  Seemingly comfortable though still in a lot of pain in his axial skeleton.  Family at bedside.  His left lower extremity and right lower extremity are neutrally aligned no evidence of any shortening or external rotation.  He is neurovascular intact.  Active hip motion tolerable as he does ambulate with the use of a walker.  General medical exam is deferred to the treating hospitalist service as well as oncology.  It is reviewed for pertinent information.  Assessment/Plan: #1.  Metastatic carcinoma of unknown primary origin.  #2 reported history of prostate cancer.  #3 metastatic lesion involving the left proximal femur as identified by CT scan.  Plan: At this point it is not entirely clear on the significance of the lesion in the proximal femur as the primary source of his pain is based on his axial skeleton.  Initial CT scan evaluation had raise concerns regarding the lesion within the proximal left femur.  However follow-up radiographs did not indicate an obvious lesion.  Given the concern about a history of prostate cancer there would be the potential concern of surgical intervention on a metastatic lesion of unknown origin in a high risk of bleeding with blastic prosthetic metastases.  I had reviewed with the patient and his family these concerns.  We discussed the possibility of radiation treatment if in fact there was an obvious lesion however due to the pain of laying on the procedure table for that he hopes that that would not be  considered.  I will continue to be available depending on the conversation that is had regarding treatment options systemically, withdrawing treatment, radiation treatment of the left hip given the unknown primary versus prophylactic nailing.  From an orthopedic perspective if there is defined weightbearing pain with the left hip with weightbearing this may further support a more significant or positive indication for intervention.  Mauri Pole 08/27/2017, 4:16 PM

## 2017-08-27 NOTE — Progress Notes (Signed)
PROGRESS NOTE    Tanner [name]  XQJ:[id number] DOB: [birth year] DOA: 08/20/2017 PCP: [name], NP   Brief Narrative:  Kathe Becton Latrese Carolan 62 y.o.malewith medical history significant ofrecently diagnosed stage IV cancer of unknown primary distorted chemo 3 weeks ago for palliative treatment comes in with shortness of breath. Family reports that he has pain is been difficult to control at home he is got multiple bony lytic lesions with rib fractures. They just adjusted his pain medications last week. He has been short of breath at home and coughing.  Assessment & Plan:   Principal Problem:   Acute respiratory failure with hypoxia (HCC) Active Problems:   Essential hypertension, benign   Generalized anxiety disorder   Bone metastasis (HCC)   Malignant tumor, spindle cell type (HCC)   Hypoxia   COPD (chronic obstructive pulmonary disease) (HCC)   Cancer associated pain   Palliative care encounter   Malnutrition of moderate degree   Acute Hypoxic Respiratory Failure  - 2/16 transferred to ICU for somnolence and increased O2 requirement - Improving on 4 L [place] this morning - suspect this is oversedation 2/2 narcotics  - Duonebs q6, prn albuterol, receiving steroids - Encourage IS - CT scan at presentation without PE, notable for metastatic disease and bony mets and pathologic rib fractures - has recent echo that showed normal EF, grade 1 diastolic dysfunction, does not appear overloaded on exam -Suspect multifactorial due to poor inspiratory effort in the setting of multiple rib metastasis and COPD, this acute worsening was suspected to be due to narcotics, but infection possible -Pain control, supplemental oxygen  Acute Encephalopathy  Hypotension  Possible Sepsis:  - noted 2/16, pt transferred to ICU - IVF - suspect all of these sx could be due to narcotics - for now, broad spectrum abx (vanc/zosyn) - follow urine (no growth) and blood cx pending (UA not c/w  infection) - follow PCT - will d/c abx when bcx NGTD x 48 hrs  Metastatic spindle cell cancer - Repeat CT imaging shows interval worsening of metastasis (interval development of multifocal liver mets, progression of multifocal lytic bone lesions with pathologic fractures within lumbar spine.  Worsening of RLL atelectasis, progression of bilateral expansile rib lesions, thoracic spine lytic bone mets) - See report - Given progression, important to discuss goals of care, I think comfort care may be most appropriate going forward (will discuss with palliative, primary oncology) - Asked ortho to eval regarding lesions in L femur and pelvis, but doubt pt will want to pursue surgical intervention at this point. - c/s rad onc to see if candidate for any further radiation therapy? -Stage IV, poorly differentiated spindle cell neoplasm, status post palliative radiotherapy, currently on systemic chemotherapy  - Appreciate palliative assistance (holding pain meds for now) - restarting some short acting pain meds, appreciate palliatve assistance - lyrica as well as decadron 8 po TID   - voltaren, lidocaine patch added for pain as well -Consulted palliative care for pain control, appreciate input - patient is now DNR - PT to eval, currently recommending SNF - will need to continue to readdress goals of care going forward with acute worsening  Urinary Retention: concerning given above.  Attempted lumbar MRI, but unable to tolerate.  Will try to repeat when pain control is Sherley Leser bit better.  Continue steroids.   COPD -continue duo nebs as needed -Possibly he has Rein Popov degree of chronic respiratory failure  Metabolic Alkalosis: improved  Hypertension -hold metoprolol, hold amlodipine.  Recently hypotensive  R Shoulder Pain - negative plain films, continue to monitor  Hyperlipidemia -Continue statin  Anemia  Leukopenia: low WBC improved.  Anemia stable, slightly downtrending, likely dilutional.   Follow.   DVT prophylaxis: lovenox Code Status: DNR Family Communication: family at bedside on 2/18 Disposition Plan: pending   Consultants:   Palliative care  Procedures:   none  Antimicrobials:   none    Subjective: Significant pain. Having problems urinating. No weakness.  Denies saddle anesthesia.  Objective: Vitals:   08/27/17 0900 08/27/17 0946 08/27/17 1000 08/27/17 1100  BP: (!) 159/89  124/74 99/69  Pulse: 82  (!) 111 (!) 117  Resp: 19  (!) 21 18  Temp:      TempSrc:      SpO2: 95% 94% 95% 95%  Weight:      Height:        Intake/Output Summary (Last 24 hours) at 08/27/2017 1425 Last data filed at 08/27/2017 0727 Gross per 24 hour  Intake 1375 ml  Output 925 ml  Net 450 ml   Filed Weights   08/20/17 1824 08/24/17 1556 08/25/17 0406  Weight: 68 kg (150 lb) 67.2 kg (148 lb 2.4 oz) 68.4 kg (150 lb 12.7 oz)    Examination:  General: No acute distress.  Moves gingerly.  Cardiovascular: Heart sounds show [place] regular rate, and rhythm. No gallops or rubs. No murmurs. No JVD. Lungs: Decreased breath sounds. Abdomen: Soft, nontender, nondistended with normal active bowel sounds. No masses. No hepatosplenomegaly. Neurological: Alert and oriented 3. 5/5 LE strength bilaterally.  No saddle anesthesia. Cranial nerves II through XII grossly intact. Skin: Warm and dry. No rashes or lesions. Extremities: No clubbing or cyanosis. No edema.  Psychiatric: Mood and affect are normal. Insight and judgment are appropriate.  Data Reviewed: I have personally reviewed following labs and imaging studies  CBC: Recent Labs  Lab 08/20/17 1851  08/24/17 0439 08/25/17 0352 08/25/17 1802 08/26/17 0337 08/27/17 0312  WBC 2.2*   < > 7.5 8.4 11.1* 9.9 9.7  NEUTROABS 1.2*  --   --   --   --   --   --   HGB 8.7*   < > 9.5* 9.3* 10.0* 8.7* 8.2*  HCT 25.2*   < > 28.4* 28.8* 30.3* 26.9* 23.9*  MCV 97.7   < > 98.3 98.3 98.7 98.9 97.6  PLT 214   < > 260 261 236 209 184   < >  = values in this interval not displayed.   Basic Metabolic Panel: Recent Labs  Lab 08/24/17 0439 08/25/17 0352 08/25/17 1802 08/26/17 0337 08/27/17 0312  NA 135 136 133* 134* 136  K 4.5 4.3 4.7 4.2 4.1  CL 95* 93* 92* 99* 100*  CO2 28 33* 31 27 27   GLUCOSE 151* 128* 127* 174* 154*  BUN 21* 26* 29* 27* 23*  CREATININE 0.60* 0.64 0.76 0.70 0.65  CALCIUM 9.5 9.4 9.2 8.4* 8.6*  MG  --  2.0  --   --  1.8   GFR: Estimated Creatinine Clearance: 96.2 mL/min (by C-G formula based on SCr of 0.65 mg/dL). Liver Function Tests: Recent Labs  Lab 08/20/17 1851 08/23/17 0439  AST 21 17  ALT 13* 11*  ALKPHOS 94 87  BILITOT 0.2* 0.2*  PROT 6.4* 6.7  ALBUMIN 2.4* 2.4*   No results for input(s): LIPASE, AMYLASE in the last 168 hours. No results for input(s): AMMONIA in the last 168 hours. Coagulation Profile: Recent Labs  Lab 08/25/17 2250  INR  1.12   Cardiac Enzymes: No results for input(s): CKTOTAL, CKMB, CKMBINDEX, TROPONINI in the last 168 hours. BNP (last 3 results) No results for input(s): PROBNP in the last 8760 hours. HbA1C: No results for input(s): HGBA1C in the last 72 hours. CBG: No results for input(s): GLUCAP in the last 168 hours. Lipid Profile: No results for input(s): CHOL, HDL, LDLCALC, TRIG, CHOLHDL, LDLDIRECT in the last 72 hours. Thyroid Function Tests: No results for input(s): TSH, T4TOTAL, FREET4, T3FREE, THYROIDAB in the last 72 hours. Anemia Panel: No results for input(s): VITAMINB12, FOLATE, FERRITIN, TIBC, IRON, RETICCTPCT in the last 72 hours. Sepsis Labs: Recent Labs  Lab 08/20/17 2309 08/25/17 2018 08/25/17 2250 08/26/17 0400 08/27/17 0312  PROCALCITON  --  0.28  --  0.30 0.22  LATICACIDVEN 0.39* 3.4* 1.6  --   --     Recent Results (from the past 240 hour(s))  Urine culture     Status: None   Collection Time: 08/25/17  7:44 PM  Result Value Ref Range Status   Specimen Description   Final    URINE, CLEAN CATCH Performed at [facility], [place] [address] [place], [place] [address]    Special Requests   Final    NONE Performed at [facility], [place] [address], [place], [place] [address]    Culture   Final    NO GROWTH Performed at [facility], [place] [address], [place], [place] [address]    Report Status 08/27/2017 FINAL  Final  Culture, blood (x 2)     Status: None (Preliminary result)   Collection Time: 08/25/17  8:18 PM  Result Value Ref Range Status   Specimen Description BLOOD RIGHT ANTECUBITAL  Final   Special Requests IN PEDIATRIC BOTTLE Blood Culture adequate volume  Final   Culture PENDING  Incomplete   Report Status PENDING  Incomplete  Culture, blood (x 2)     Status: None (Preliminary result)   Collection Time: 08/25/17  8:18 PM  Result Value Ref Range Status   Specimen Description   Final    BLOOD RIGHT HAND Performed at [place] [address] [place], [place] [address]    Special Requests   Final    BOTTLES DRAWN AEROBIC ONLY Blood Culture adequate volume   Culture PENDING  Incomplete   Report Status PENDING  Incomplete  MRSA PCR Screening     Status: None   Collection Time: 08/26/17 10:19 PM  Result Value Ref Range Status   MRSA by PCR NEGATIVE NEGATIVE Final    Comment:        The GeneXpert MRSA Assay (FDA approved for NASAL specimens only), is one component of [facility] comprehensive MRSA colonization surveillance program. It is not intended to diagnose MRSA infection nor to guide or monitor treatment for MRSA infections. Performed at [facility], [place] [address] [place],  [address]          Radiology Studies: Ct Angio Chest Pe W Or Wo Contrast  Result Date: 08/26/2017 CLINICAL DATA:  Dyspnea. Chronic. Stage IV cancer of unknown primary. EXAM: CT ANGIOGRAPHY CHEST WITH CONTRAST TECHNIQUE: Multidetector CT imaging of the chest was performed using the standard protocol during bolus administration  of intravenous contrast. Multiplanar CT image reconstructions and MIPs were obtained to evaluate the vascular anatomy. CONTRAST:  197mL ISOVUE-370 IOPAMIDOL (ISOVUE-370) INJECTION 76% COMPARISON:  CT chest 08/20/2017 and CT abdomen and pelvis from 05/04/2017 and PET-CT from 05/11/2017 FINDINGS: Cardiovascular:  The heart size appears normal. No pericardial effusion. Aortic atherosclerosis. Calcification in the left main coronary artery and LAD identified. The main pulmonary artery is patent. No lobar or central obstructing pulmonary emboli identified. Respiratory motion artifact diminishes exam detail within the lower lobe pulmonary arteries. No lobar or segmental pulmonary artery filling defects identified to suggest Acasia Skilton clinically significant acute pulmonary embolus. Mediastinum/Nodes: The thyroid gland appears normal. The trachea is patent and midline. Normal appearance of the esophagus. The enlarged prevascular lymph node measures 2.7 x 1.7 cm, image 92 of series 3. Unchanged from previous exam. No additional mediastinal or hilar adenopathy. No axillary or supraclavicular adenopathy. Lungs/Pleura: Small left pleural effusion is similar to previous exam. Significant worsening aeration to the right lower lobe secondary to airspace consolidation and atelectasis. Left upper lobe paramediastinal subpleural mass measures 3.2 by 2.2 cm, image 34 of series 2. Previously this measured the same. Scattered ground-glass and tree-in-bud nodularity identified within both lungs. Upper Abdomen: Progressive liver metastasis identified compared with CT from 05/04/2017. Musculoskeletal: Extensive multifocal lytic bone metastases are identified. Large left posterolateral eighth rib expansile lesion measures 7.4 by 3.4 cm, image 182 of series 3. Previously 6.3 by 3.2 cm. Expansile right lateral eighth rib lesion measures 5.6 by 2.4 cm, image 212 of series 3. Previously 3.4 x 2.5 cm. Numerous lytic bone lesions are identified throughout  the thoracic spine. Using the same numbering scheme as the study from 08/20/2017 pathologic fractures involving T7, T8 and T9 vertebra are again identified. Review of the MIP images confirms the above findings. IMPRESSION: 1. No evidence for acute pulmonary embolus. 2. Interval progression of right lower lobe airspace consolidation in atelectasis. This may in part be contributing to the patient's difficulty with breathing. 3. Bilateral expansile rib lesions have progressed from previous exam. 4. Extensive thoracic spine lytic bone metastasis with multi level pathologic fractures. 5. Similar appearance of enlarged prevascular mediastinal lymph node and left upper lobe subpleural mass. Electronically Signed   By: Kerby Moors M.D.   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 Severe pain. EXAM: MRI LUMBAR SPINE WITHOUT CONTRAST TECHNIQUE: Multiplanar, multisequence MR imaging of the lumbar spine was performed. No intravenous contrast was administered. COMPARISON:  CT Abdomen and Pelvis 08/26/2017.  PET-CT 05/11/2017 FINDINGS: The examination had to be discontinued prior to completion due to severe pain. Only sagittal T1 weighted imaging was obtained. Normal lumbar segmentation demonstrated on the recent CT Abdomen and Pelvis. Malignant appearing decreased T1 bone marrow signal is scattered throughout the visible spinal vertebrae from the T12 to the S2 level. These correspond to predominately lytic osseous lesions on the recent CT. Stable vertebral height and alignment to the CT yesterday. There is early lumbar pedicle involvement on the left at L3 and on the right at L4. There is left lamina involvement at L1. There is no malignant spinal or neural foraminal stenosis identified. Stable visible abdominal viscera. IMPRESSION: 1. The examination had to be  discontinued prior to completion due to severe pain. Only sagittal T1 weighted imaging was obtained. 2. Stable lower thoracic and lumbar osseous metastatic disease compared to the CT yesterday. No malignant lumbar spinal or neural impingement identified. Electronically Signed   By: Genevie Ann M.D.   On: 08/27/2017 11:51   Ct Abdomen Pelvis W Contrast  Result Date: 08/26/2017 CLINICAL DATA:  Stage IV cancer of unknown primary.  Abdominal pain. EXAM: CT ABDOMEN AND PELVIS WITH CONTRAST TECHNIQUE: Multidetector CT imaging of the abdomen and pelvis was performed using the standard protocol following bolus administration of intravenous contrast. CONTRAST:  124mL ISOVUE-370 IOPAMIDOL (ISOVUE-370) INJECTION 76% COMPARISON:  05/04/17. FINDINGS: Lower chest: Dense airspace consolidation within the posterior and medial right lung base identified. Small left pleural effusion identified. Hepatobiliary: Progressive multifocal liver metastases identified. Index lesion within the posterior right lobe of liver measures 3.2 cm, image 19 of series 4. New from previous exam. Inferior right lobe of liver metastasis is also new measuring 2.4 cm, image 35 of series 4. Anterior right lobe of liver metastasis measures 2.0 cm, image 29 of series 4. New from previous exam. Gallbladder appears normal. No biliary dilatation. Pancreas: Normal appearance of the pancreas. Spleen: The spleen is unremarkable. Adrenals/Urinary Tract: Normal appearance of both the adrenal glands. Bilateral kidney cysts are again noted. No mass or hydronephrosis visualized. Urinary bladder appears normal. Stomach/Bowel: The stomach is normal. The small bowel loops have Finlee Milo normal course and caliber without evidence for bowel obstruction. No pathologic dilatation of the colon. Vascular/Lymphatic: Aortic atherosclerosis. No aneurysm. No abdominal adenopathy. No pelvic or inguinal adenopathy. Reproductive: Previous prostatectomy. Other: Small supraumbilical ventral  abdominal wall hernia contains fat only. Trace free fluid noted within the pelvis. Musculoskeletal: Multifocal lytic bone metastases are identified. Progressed from previous exam. Bilateral expansile rib lesions are identified, new from previous exam. Left posterolateral ninth rib lesion measures 3.6 x 2.0 cm, image 15 of series 4. Left posterior eighth rib expansile lesion measures 7 x 4.1 cm. Extensive lytic bone metastases are noted throughout the visualized portions of the thoracic and lumbar spine. Increased from prior exam. L2 lytic lesion with pathologic compression fracture measures 1.7 cm, image 68 of series 8. New from previous exam. Large lytic lesion within the T8 vertebra with associated superior endplate pathologic fracture is new measuring 1.9 cm, image number 68 of series 8. S1 lytic lesion measures 2.2 cm, image 69 of series 8. The previously 0.8 cm. Enlarging lytic lesion involving the posterior cortex of the proximal left femur may be of orthopedic significance measuring 2.5 cm, image 90 of series 4. Previously 0.8 cm. Also of potential orthopedic significance are bilateral inferior pubic rami lytic lesions. IMPRESSION: 1. Interval development of multifocal liver metastasis. 2. Significant interval progression of multifocal lytic bone metastases involving the spine, bony pelvis and left femur. Associated pathologic fractures are noted within the lumbar spine. Lesions involving the left femur and pelvis may be of orthopedic significance. Electronically Signed   By: Kerby Moors M.D.   On: 08/26/2017 17:35   Dg Chest Port 1 View  Result Date: 08/25/2017 CLINICAL DATA:  Right-sided chest pain with deep breathing. Shortness of breath and cough, recently diagnosed with stage IV cancer. History of hypertension. EXAM: PORTABLE CHEST 1 VIEW COMPARISON:  Chest x-ray from earlier same day and chest x-ray dated 08/20/2017. Chest CT dated 08/20/2017. FINDINGS: Cardiomediastinal silhouette is stable in  size and configuration. Again noted are the known chest wall metastases bilaterally. No new lung findings to suggest Kerston Landeck developing pneumonia. No pleural effusion or pneumothorax seen. IMPRESSION: Stable chest x-ray. No evidence of pneumonia or pulmonary edema. Known chest wall metastases again noted bilaterally. Electronically Signed   By: Franki Cabot M.D.   On: 08/25/2017 18:05   Dg Hip Unilat With Pelvis 2-3 Views Left  Result Date: 08/27/2017 CLINICAL DATA:  Left hip pain without known injury. EXAM: DG HIP (WITH OR WITHOUT  PELVIS) 2-3V LEFT COMPARISON:  None. FINDINGS: There is no evidence of hip fracture or dislocation. There is no evidence of arthropathy or other focal bone abnormality. IMPRESSION: Normal left hip. Electronically Signed   By: Marijo Conception, M.D.   On: 08/27/2017 12:10        Scheduled Meds: . acetaminophen  1,000 mg Oral Q8H  . dexamethasone  8 mg Oral Q8H  . diclofenac sodium  2 g Topical QID  . docusate sodium  100 mg Oral BID  . enoxaparin (LOVENOX) injection  40 mg Subcutaneous Daily  . feeding supplement (ENSURE ENLIVE)  237 mL Oral TID BM  . guaiFENesin  600 mg Oral BID  . ipratropium-albuterol  3 mL Nebulization Q6H  . lidocaine  1 patch Transdermal Q24H  . multivitamin with minerals  1 tablet Oral Daily  . pantoprazole  40 mg Oral Daily  . polyethylene glycol  17 g Oral Daily  . pregabalin  150 mg Oral BID  . PRUTECT  1 application Topical BID  . senna  2 tablet Oral BID  . simvastatin  20 mg Oral q1800  . sodium chloride flush  3 mL Intravenous Q12H  . thiamine  100 mg Oral Daily   Continuous Infusions: . sodium chloride    . piperacillin-tazobactam (ZOSYN)  IV 3.375 g (08/27/17 1311)  . vancomycin Stopped (08/27/17 0727)     LOS: 6 days    Time spent: over 30 min    Fayrene Helper, MD [facility] Pager [phone]  If 7PM-7AM, please contact night-coverage [email or web address] Password [email or web address] 08/27/2017, 2:25 PM

## 2017-08-27 NOTE — Progress Notes (Signed)
PT Cancellation Note  Patient Details Name: Tanner [name] MRN: [id number] DOB: 11-03-1955   Cancelled Treatment:    Reason Eval/Treat Not Completed: Pain limiting ability to participate(pt reports severe back pain and that he can't tolerate activity at present, he has had pain medication, will follow. )   Philomena Doheny 08/27/2017, 2:15 PM [phone]

## 2017-08-27 NOTE — Care Management Note (Signed)
Case Management Note  Patient Details  Name: Meliton Samad MRN: [id number] Date of Birth: [birth year]  Subjective/Objective: Noted per Palliative-eligible for hospice-if d/c plan is for home.Can arrange home w/hospice w/CM referral to offer Home hospice choice.                   Action/Plan:d/c home   Expected Discharge Date:  (unknown)               Expected Discharge Plan:  Home w Hospice Care  In-House Referral:     Discharge planning Services  CM Consult  Post Acute Care Choice:    Choice offered to:     DME Arranged:    DME Agency:     HH Arranged:    HH Agency:     Status of Service:  In process, will continue to follow  If discussed at Long Length of Stay Meetings, dates discussed:    Additional Comments:  Dessa Phi, RN 08/27/2017, 2:11 PM

## 2017-08-28 ENCOUNTER — Ambulatory Visit: Payer: Self-pay | Admitting: Radiation Oncology

## 2017-08-28 DIAGNOSIS — C787 Secondary malignant neoplasm of liver and intrahepatic bile duct: Secondary | ICD-10-CM

## 2017-08-28 LAB — CBC
HEMATOCRIT: 27.6 % — AB (ref 39.0–52.0)
Hemoglobin: 9.1 g/dL — ABNORMAL LOW (ref 13.0–17.0)
MCH: 31.9 pg (ref 26.0–34.0)
MCHC: 33 g/dL (ref 30.0–36.0)
MCV: 96.8 fL (ref 78.0–100.0)
Platelets: 178 10*3/uL (ref 150–400)
RBC: 2.85 MIL/uL — ABNORMAL LOW (ref 4.22–5.81)
RDW: 13.6 % (ref 11.5–15.5)
WBC: 12.9 10*3/uL — ABNORMAL HIGH (ref 4.0–10.5)

## 2017-08-28 LAB — COMPREHENSIVE METABOLIC PANEL
ALBUMIN: 2.1 g/dL — AB (ref 3.5–5.0)
ALT: 24 U/L (ref 17–63)
ANION GAP: 11 (ref 5–15)
AST: 26 U/L (ref 15–41)
Alkaline Phosphatase: 67 U/L (ref 38–126)
BILIRUBIN TOTAL: 0.5 mg/dL (ref 0.3–1.2)
BUN: 21 mg/dL — ABNORMAL HIGH (ref 6–20)
CALCIUM: 8.5 mg/dL — AB (ref 8.9–10.3)
CO2: 27 mmol/L (ref 22–32)
Chloride: 94 mmol/L — ABNORMAL LOW (ref 101–111)
Creatinine, Ser: 0.63 mg/dL (ref 0.61–1.24)
GLUCOSE: 135 mg/dL — AB (ref 65–99)
POTASSIUM: 4.3 mmol/L (ref 3.5–5.1)
Sodium: 132 mmol/L — ABNORMAL LOW (ref 135–145)
TOTAL PROTEIN: 5.6 g/dL — AB (ref 6.5–8.1)

## 2017-08-28 LAB — PROCALCITONIN: PROCALCITONIN: 0.19 ng/mL

## 2017-08-28 LAB — TROPONIN I

## 2017-08-28 LAB — MAGNESIUM: MAGNESIUM: 1.8 mg/dL (ref 1.7–2.4)

## 2017-08-28 MED ORDER — FENTANYL 25 MCG/HR TD PT72
25.0000 ug | MEDICATED_PATCH | TRANSDERMAL | Status: DC
  Administered 2017-08-28: 25 ug via TRANSDERMAL
  Filled 2017-08-28: qty 1

## 2017-08-28 MED ORDER — DRONABINOL 5 MG PO CAPS
5.0000 mg | ORAL_CAPSULE | Freq: Three times a day (TID) | ORAL | Status: DC
  Administered 2017-08-28 – 2017-09-04 (×23): 5 mg via ORAL
  Filled 2017-08-28 (×4): qty 1
  Filled 2017-08-28: qty 2
  Filled 2017-08-28 (×6): qty 1
  Filled 2017-08-28: qty 2
  Filled 2017-08-28 (×11): qty 1

## 2017-08-28 MED ORDER — IPRATROPIUM-ALBUTEROL 0.5-2.5 (3) MG/3ML IN SOLN
3.0000 mL | Freq: Two times a day (BID) | RESPIRATORY_TRACT | Status: DC
  Administered 2017-08-28 – 2017-09-01 (×7): 3 mL via RESPIRATORY_TRACT
  Filled 2017-08-28 (×8): qty 3

## 2017-08-28 MED ORDER — SENNOSIDES-DOCUSATE SODIUM 8.6-50 MG PO TABS
2.0000 | ORAL_TABLET | Freq: Every day | ORAL | Status: DC
  Administered 2017-08-28 – 2017-09-03 (×6): 2 via ORAL
  Filled 2017-08-28 (×6): qty 2

## 2017-08-28 MED ORDER — HYDRALAZINE HCL 20 MG/ML IJ SOLN
10.0000 mg | Freq: Once | INTRAMUSCULAR | Status: AC
  Administered 2017-08-28: 10 mg via INTRAVENOUS
  Filled 2017-08-28: qty 1

## 2017-08-28 MED ORDER — FENTANYL CITRATE (PF) 100 MCG/2ML IJ SOLN
100.0000 ug | INTRAMUSCULAR | Status: DC | PRN
  Administered 2017-08-28 (×4): 100 ug via INTRAVENOUS
  Filled 2017-08-28 (×4): qty 2

## 2017-08-28 MED ORDER — FENTANYL CITRATE (PF) 100 MCG/2ML IJ SOLN
100.0000 ug | INTRAMUSCULAR | Status: DC | PRN
  Administered 2017-08-28 (×3): 100 ug via INTRAVENOUS
  Filled 2017-08-28 (×3): qty 2

## 2017-08-28 MED ORDER — CALCIUM CARBONATE ANTACID 500 MG PO CHEW
1.0000 | CHEWABLE_TABLET | Freq: Three times a day (TID) | ORAL | Status: DC | PRN
  Filled 2017-08-28: qty 1

## 2017-08-28 MED ORDER — OXYCODONE HCL 5 MG PO TABS
15.0000 mg | ORAL_TABLET | ORAL | Status: DC | PRN
  Administered 2017-08-28 – 2017-08-29 (×5): 15 mg via ORAL
  Filled 2017-08-28 (×5): qty 3

## 2017-08-28 MED ORDER — FENTANYL CITRATE (PF) 100 MCG/2ML IJ SOLN
25.0000 ug | Freq: Once | INTRAMUSCULAR | Status: AC
  Administered 2017-08-28: 25 ug via INTRAVENOUS
  Filled 2017-08-28: qty 2

## 2017-08-28 NOTE — Progress Notes (Signed)
PT Cancellation Note  Patient Details Name: Tanner [name] MRN: [id number] DOB: Aug 05, 1955   Cancelled Treatment:    Reason Eval/Treat Not Completed: Pain limiting ability to participate. Pt has not been able to participate in PT 2* for past 3 attempts. Will sign off, please re-order PT if he is able to participate. Thank you.    Blondell Reveal Kistler 08/28/2017, 11:22 AM [phone]

## 2017-08-28 NOTE — Progress Notes (Signed)
Pt c/o chest pain. EKG done at bedside negative. Pain medication given.

## 2017-08-28 NOTE — Progress Notes (Addendum)
PROGRESS NOTE    Tanner [name]  XFG:[id number] DOB: 12-07-1955 DOA: 08/20/2017 PCP: Tanner Sell, NP   Brief Narrative:  Tanner [name] 62 y.o.malewith medical history significant ofrecently diagnosed stage IV cancer of unknown primary distorted chemo 3 weeks ago for palliative treatment comes in with shortness of breath. Family reports that he has pain is been difficult to control at home he is got multiple bony lytic lesions with rib fractures. They just adjusted his pain medications last week. He has been short of breath at home and coughing.  Assessment & Plan:   Principal Problem:   Acute respiratory failure with hypoxia (HCC) Active Problems:   Essential hypertension, benign   Generalized anxiety disorder   Bone metastasis (HCC)   Malignant tumor, spindle cell type (HCC)   Hypoxia   COPD (chronic obstructive pulmonary disease) (HCC)   Cancer associated pain   Palliative care encounter   Malnutrition of moderate degree  Metastatic spindle cell cancer -Stage IV, poorly differentiated spindle cell neoplasm, status post palliative radiotherapy, currently on systemic chemotherapy - Repeat CT imaging from 08/28/17 shows interval worsening of metastasis (interval development of multifocal liver mets, progression of multifocal lytic bone lesions with pathologic fractures within lumbar spine.  Worsening of RLL atelectasis, progression of bilateral expansile rib lesions, thoracic spine lytic bone mets) - See report - Prognosis is poor given rapid progression noted above.  On discussion with Tanner [name], noted prognosis may be only weeks.  Will focus on comfort care going forward.  Pt is appropriate for hospice.  Appreciate Tanner [name] and palliative care assistance.  - Asked ortho to eval regarding lesions in L femur and pelvis - appreciate recs.  Given above, I wouldn't pursue any surgery at this point.  - c/s rad onc (pending, but pt does not sound interested in any further  radiation therapy)  - Appreciate palliative assistance (holding pain meds for now) - restarting short and long acting pain regimen  - lyrica as well as decadron 8 po TID   - voltaren, lidocaine patch added for pain as well  Acute Hypoxic Respiratory Failure  - 2/16 transferred to ICU for somnolence and increased O2 requirement - Improving on 2 L Tanner [name] this morning - suspect this was oversedation 2/2 narcotics  - CT chest from 2/17 notable for progression in RLL airspace consolidation in atelectasis - Duonebs q6, prn albuterol, receiving steroids - Encourage IS - CT scan at presentation without PE, notable for metastatic disease and bony mets and pathologic rib fractures - has recent echo that showed normal EF, grade 1 diastolic dysfunction, does not appear overloaded on exam -Suspect multifactorial due to poor inspiratory effort in the setting of multiple rib metastasis and COPD,  And this acute worsening was suspected to be due to narcotics, but infection possible with RLL consolidation -Pain control, supplemental oxygen  Acute Encephalopathy  Hypotension  Possible Sepsis:  - noted 2/16, pt transferred to ICU - IVF - suspect all of these sx could be due to narcotics - for now, broad spectrum abx (vanc/zosyn) - follow urine (no growth) and blood cx pending (UA not c/w infection) - follow PCT - will plan to narrow abx when bcx NGTD x 48 hrs (will d/c vanc and continue zosyn for now for possible pneumonia given above)  Urinary Retention: concerning given above.  Attempted lumbar MRI, but unable to tolerate the whole study.  Study that was completed did not note any malignant spinal or neural foraminal stenosis, discussed with rads  who noted no evidence of cauda equina.  Exam without saddle anesthesia, 5/5 LE strength.   Will try to remove foley (tomorrow per pt request).  Chest Discomfort: suspect 2/2 above.  EKG last night non ischemic.  Will check trop x1.  COPD -continue duo nebs as  needed -Possibly he has Tanner [name] degree of chronic respiratory failure  Metabolic Alkalosis: improved  Hypertension -hold metoprolol, hold amlodipine.  Recently hypotensive  R Shoulder Pain - negative plain films, continue to monitor  Hyperlipidemia -Continue statin  Anemia: Anemia stable.  Follow.   Leukocytosis: continue to monitor  DVT prophylaxis: lovenox Code Status: DNR Family Communication: family at bedside on 2/18 Disposition Plan: pending   Consultants:   Palliative care  Procedures:   none  Antimicrobials:   none    Subjective: Pain better. No saddle anesthesia.   Objective: Vitals:   08/28/17 0400 08/28/17 0500 08/28/17 0600 08/28/17 0700  BP: (!) 184/110 (!) 178/111 (!) 155/85 131/88  Pulse: 78 75 (!) 124 87  Resp: 14 12 19 12   Temp:      TempSrc:      SpO2: 97% 97% 93% 96%  Weight:      Height:        Intake/Output Summary (Last 24 hours) at 08/28/2017 0835 Last data filed at 08/28/2017 0800 Gross per 24 hour  Intake 1190 ml  Output 2977 ml  Net -1787 ml   Filed Weights   08/24/17 1556 08/25/17 0406 08/27/17 1831  Weight: 67.2 kg (148 lb 2.4 oz) 68.4 kg (150 lb 12.7 oz) 71.8 kg (158 lb 4.6 oz)    Examination:  General: No acute distress. Cardiovascular: Heart sounds show Tanner [name] regular rate, and rhythm. No gallops or rubs. No murmurs. No JVD. Lungs: on 2 LNC, clear to auscultation bilaterally  Abdomen: Soft, nontender, nondistended with normal active bowel sounds. No masses. No hepatosplenomegaly. Neurological: Alert and oriented 3. 5/5 strength to LE.  No saddle. Cranial nerves II through XII grossly intact. Skin: Warm and dry. No rashes or lesions. Extremities: No clubbing or cyanosis. No edema.  Psychiatric: Mood and affect are normal. Insight and judgment are appropriate.   Data Reviewed: I have personally reviewed following labs and imaging studies  CBC: Recent Labs  Lab 08/25/17 0352 08/25/17 1802 08/26/17 0337  08/27/17 0312 08/27/17 1443 08/28/17 0337  WBC 8.4 11.1* 9.9 9.7  --  12.9*  HGB 9.3* 10.0* 8.7* 8.2* 8.5* 9.1*  HCT 28.8* 30.3* 26.9* 23.9* 25.2* 27.6*  MCV 98.3 98.7 98.9 97.6  --  96.8  PLT 261 236 209 184  --  638   Basic Metabolic Panel: Recent Labs  Lab 08/25/17 0352 08/25/17 1802 08/26/17 0337 08/27/17 0312 08/28/17 0337  NA 136 133* 134* 136 132*  K 4.3 4.7 4.2 4.1 4.3  CL 93* 92* 99* 100* 94*  CO2 33* 31 27 27 27   GLUCOSE 128* 127* 174* 154* 135*  BUN 26* 29* 27* 23* 21*  CREATININE 0.64 0.76 0.70 0.65 0.63  CALCIUM 9.4 9.2 8.4* 8.6* 8.5*  MG 2.0  --   --  1.8 1.8   GFR: Estimated Creatinine Clearance: 101 mL/min (by C-G formula based on SCr of 0.63 mg/dL). Liver Function Tests: Recent Labs  Lab 08/23/17 0439 08/28/17 0337  AST 17 26  ALT 11* 24  ALKPHOS 87 67  BILITOT 0.2* 0.5  PROT 6.7 5.6*  ALBUMIN 2.4* 2.1*   No results for input(s): LIPASE, AMYLASE in the last 168 hours. No results for  input(s): AMMONIA in the last 168 hours. Coagulation Profile: Recent Labs  Lab 08/25/17 2250  INR 1.12   Cardiac Enzymes: No results for input(s): CKTOTAL, CKMB, CKMBINDEX, TROPONINI in the last 168 hours. BNP (last 3 results) No results for input(s): PROBNP in the last 8760 hours. HbA1C: No results for input(s): HGBA1C in the last 72 hours. CBG: No results for input(s): GLUCAP in the last 168 hours. Lipid Profile: No results for input(s): CHOL, HDL, LDLCALC, TRIG, CHOLHDL, LDLDIRECT in the last 72 hours. Thyroid Function Tests: No results for input(s): TSH, T4TOTAL, FREET4, T3FREE, THYROIDAB in the last 72 hours. Anemia Panel: No results for input(s): VITAMINB12, FOLATE, FERRITIN, TIBC, IRON, RETICCTPCT in the last 72 hours. Sepsis Labs: Recent Labs  Lab 08/25/17 2018 08/25/17 2250 08/26/17 0400 08/27/17 0312 08/28/17 0337  PROCALCITON 0.28  --  0.30 0.22 0.19  LATICACIDVEN 3.4* 1.6  --   --   --     Recent Results (from the past 240 hour(s))   Urine culture     Status: None   Collection Time: 08/25/17  7:44 PM  Result Value Ref Range Status   Specimen Description   Final    URINE, CLEAN CATCH Performed at [facility], [place] [address], [place], [place] [address]    Special Requests   Final    NONE Performed at [facility], [place] [address], [place], [place] [address]    Culture   Final    NO GROWTH Performed at [facility], [place] [address] [place], [place] [address]    Report Status 08/27/2017 FINAL  Final  Culture, blood (x 2)     Status: None (Preliminary result)   Collection Time: 08/25/17  8:18 PM  Result Value Ref Range Status   Specimen Description BLOOD RIGHT ANTECUBITAL  Final   Special Requests IN PEDIATRIC BOTTLE Blood Culture adequate volume  Final   Culture   Final    NO GROWTH 1 DAY Performed at [facility], [place] [address], [place], [place] [address]    Report Status PENDING  Incomplete  Culture, blood (x 2)     Status: None (Preliminary result)   Collection Time: 08/25/17  8:18 PM  Result Value Ref Range Status   Specimen Description   Final    BLOOD RIGHT HAND Performed at [place] [address], [place], [place] [address]    Special Requests   Final    BOTTLES DRAWN AEROBIC ONLY Blood Culture adequate volume   Culture   Final    NO GROWTH 1 DAY Performed at [facility], [place] [address] [place], [place] [address]    Report Status PENDING  Incomplete  MRSA PCR Screening     Status: None   Collection Time: 08/26/17 10:19 PM  Result Value Ref Range Status   MRSA by PCR NEGATIVE NEGATIVE Final    Comment:        The GeneXpert MRSA Assay (FDA approved for NASAL specimens only), is one component of Jenefer Woerner comprehensive MRSA colonization surveillance program. It is not intended to diagnose MRSA infection nor to guide or monitor treatment for MRSA infections. Performed at [facility], [place]  [address] [place], [place] [address]          Radiology Studies: Ct Angio Chest Pe W Or Wo Contrast  Result Date: 08/26/2017 CLINICAL DATA:  Dyspnea. Chronic. Stage IV cancer of unknown primary. EXAM: CT ANGIOGRAPHY CHEST WITH  CONTRAST TECHNIQUE: Multidetector CT imaging of the chest was performed using the standard protocol during bolus administration of intravenous contrast. Multiplanar CT image reconstructions and MIPs were obtained to evaluate the vascular anatomy. CONTRAST:  113mL ISOVUE-370 IOPAMIDOL (ISOVUE-370) INJECTION 76% COMPARISON:  CT chest 08/20/2017 and CT abdomen and pelvis from 05/04/2017 and PET-CT from 05/11/2017 FINDINGS: Cardiovascular: The heart size appears normal. No pericardial effusion. Aortic atherosclerosis. Calcification in the left main coronary artery and LAD identified. The main pulmonary artery is patent. No lobar or central obstructing pulmonary emboli identified. Respiratory motion artifact diminishes exam detail within the lower lobe pulmonary arteries. No lobar or segmental pulmonary artery filling defects identified to suggest Leam Madero clinically significant acute pulmonary embolus. Mediastinum/Nodes: The thyroid gland appears normal. The trachea is patent and midline. Normal appearance of the esophagus. The enlarged prevascular lymph node measures 2.7 x 1.7 cm, image 92 of series 3. Unchanged from previous exam. No additional mediastinal or hilar adenopathy. No axillary or supraclavicular adenopathy. Lungs/Pleura: Small left pleural effusion is similar to previous exam. Significant worsening aeration to the right lower lobe secondary to airspace consolidation and atelectasis. Left upper lobe paramediastinal subpleural mass measures 3.2 by 2.2 cm, image 34 of series 2. Previously this measured the same. Scattered ground-glass and tree-in-bud nodularity identified within both lungs. Upper Abdomen: Progressive liver metastasis identified compared with CT from 05/04/2017.  Musculoskeletal: Extensive multifocal lytic bone metastases are identified. Large left posterolateral eighth rib expansile lesion measures 7.4 by 3.4 cm, image 182 of series 3. Previously 6.3 by 3.2 cm. Expansile right lateral eighth rib lesion measures 5.6 by 2.4 cm, image 212 of series 3. Previously 3.4 x 2.5 cm. Numerous lytic bone lesions are identified throughout the thoracic spine. Using the same numbering scheme as the study from 08/20/2017 pathologic fractures involving T7, T8 and T9 vertebra are again identified. Review of the MIP images confirms the above findings. IMPRESSION: 1. No evidence for acute pulmonary embolus. 2. Interval progression of right lower lobe airspace consolidation in atelectasis. This may in part be contributing to the patient's difficulty with breathing. 3. Bilateral expansile rib lesions have progressed from previous exam. 4. Extensive thoracic spine lytic bone metastasis with multi level pathologic fractures. 5. Similar appearance of enlarged prevascular mediastinal lymph node and left upper lobe subpleural mass. Electronically Signed   By: Kerby Moors M.D.   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 Severe pain. EXAM: MRI LUMBAR SPINE WITHOUT CONTRAST TECHNIQUE: Multiplanar, multisequence MR imaging of the lumbar spine was performed. No intravenous contrast was administered. COMPARISON:  CT Abdomen and Pelvis 08/26/2017.  PET-CT 05/11/2017 FINDINGS: The examination had to be discontinued prior to completion due to severe pain. Only sagittal T1 weighted imaging was obtained. Normal lumbar segmentation demonstrated on the recent CT Abdomen and Pelvis. Malignant appearing decreased T1 bone marrow signal is scattered throughout the visible spinal vertebrae from the T12 to the S2 level. These correspond to predominately lytic  osseous lesions on the recent CT. Stable vertebral height and alignment to the CT yesterday. There is early lumbar pedicle involvement on the left at L3 and on the right at L4. There is left lamina involvement at L1. There is no malignant spinal or neural foraminal stenosis identified. Stable visible abdominal viscera. IMPRESSION: 1. The examination had to be discontinued prior to completion due to severe pain. Only sagittal T1 weighted  imaging was obtained. 2. Stable lower thoracic and lumbar osseous metastatic disease compared to the CT yesterday. No malignant lumbar spinal or neural impingement identified. Electronically Signed   By: Genevie Ann M.D.   On: 08/27/2017 11:51   Ct Abdomen Pelvis W Contrast  Result Date: 08/26/2017 CLINICAL DATA:  Stage IV cancer of unknown primary.  Abdominal pain. EXAM: CT ABDOMEN AND PELVIS WITH CONTRAST TECHNIQUE: Multidetector CT imaging of the abdomen and pelvis was performed using the standard protocol following bolus administration of intravenous contrast. CONTRAST:  152mL ISOVUE-370 IOPAMIDOL (ISOVUE-370) INJECTION 76% COMPARISON:  05/04/17. FINDINGS: Lower chest: Dense airspace consolidation within the posterior and medial right lung base identified. Small left pleural effusion identified. Hepatobiliary: Progressive multifocal liver metastases identified. Index lesion within the posterior right lobe of liver measures 3.2 cm, image 19 of series 4. New from previous exam. Inferior right lobe of liver metastasis is also new measuring 2.4 cm, image 35 of series 4. Anterior right lobe of liver metastasis measures 2.0 cm, image 29 of series 4. New from previous exam. Gallbladder appears normal. No biliary dilatation. Pancreas: Normal appearance of the pancreas. Spleen: The spleen is unremarkable. Adrenals/Urinary Tract: Normal appearance of both the adrenal glands. Bilateral kidney cysts are again noted. No mass or hydronephrosis visualized. Urinary bladder appears normal.  Stomach/Bowel: The stomach is normal. The small bowel loops have Xandrea Clarey normal course and caliber without evidence for bowel obstruction. No pathologic dilatation of the colon. Vascular/Lymphatic: Aortic atherosclerosis. No aneurysm. No abdominal adenopathy. No pelvic or inguinal adenopathy. Reproductive: Previous prostatectomy. Other: Small supraumbilical ventral abdominal wall hernia contains fat only. Trace free fluid noted within the pelvis. Musculoskeletal: Multifocal lytic bone metastases are identified. Progressed from previous exam. Bilateral expansile rib lesions are identified, new from previous exam. Left posterolateral ninth rib lesion measures 3.6 x 2.0 cm, image 15 of series 4. Left posterior eighth rib expansile lesion measures 7 x 4.1 cm. Extensive lytic bone metastases are noted throughout the visualized portions of the thoracic and lumbar spine. Increased from prior exam. L2 lytic lesion with pathologic compression fracture measures 1.7 cm, image 68 of series 8. New from previous exam. Large lytic lesion within the T8 vertebra with associated superior endplate pathologic fracture is new measuring 1.9 cm, image number 68 of series 8. S1 lytic lesion measures 2.2 cm, image 69 of series 8. The previously 0.8 cm. Enlarging lytic lesion involving the posterior cortex of the proximal left femur may be of orthopedic significance measuring 2.5 cm, image 90 of series 4. Previously 0.8 cm. Also of potential orthopedic significance are bilateral inferior pubic rami lytic lesions. IMPRESSION: 1. Interval development of multifocal liver metastasis. 2. Significant interval progression of multifocal lytic bone metastases involving the spine, bony pelvis and left femur. Associated pathologic fractures are noted within the lumbar spine. Lesions involving the left femur and pelvis may be of orthopedic significance. Electronically Signed   By: Kerby Moors M.D.   On: 08/26/2017 17:35   Dg Hip Unilat With Pelvis 2-3  Views Left  Result Date: 08/27/2017 CLINICAL DATA:  Left hip pain without known injury. EXAM: DG HIP (WITH OR WITHOUT PELVIS) 2-3V LEFT COMPARISON:  None. FINDINGS: There is no evidence of hip fracture or dislocation. There is no evidence of arthropathy or other focal bone abnormality. IMPRESSION: Normal left hip. Electronically Signed   By: Marijo Conception, M.D.   On: 08/27/2017 12:10        Scheduled Meds: . acetaminophen  1,000 mg Oral  Q8H  . dexamethasone  8 mg Oral Q8H  . diclofenac sodium  2 g Topical QID  . docusate sodium  100 mg Oral BID  . dronabinol  5 mg Oral BID AC  . enoxaparin (LOVENOX) injection  40 mg Subcutaneous Daily  . feeding supplement (ENSURE ENLIVE)  237 mL Oral TID BM  . guaiFENesin  600 mg Oral BID  . ipratropium-albuterol  3 mL Nebulization TID  . lidocaine  1 patch Transdermal Q24H  . multivitamin with minerals  1 tablet Oral Daily  . pantoprazole  40 mg Oral Daily  . polyethylene glycol  17 g Oral Daily  . pregabalin  150 mg Oral BID  . PRUTECT  1 application Topical BID  . senna  2 tablet Oral BID  . simvastatin  20 mg Oral q1800  . sodium chloride flush  3 mL Intravenous Q12H  . thiamine  100 mg Oral Daily   Continuous Infusions: . sodium chloride    . piperacillin-tazobactam (ZOSYN)  IV 3.375 g (08/28/17 0740)  . vancomycin Stopped (08/28/17 0704)     LOS: 7 days    Time spent: over 30 min    Fayrene Helper, MD [facility] Pager [phone]  If 7PM-7AM, please contact night-coverage [email or web address] Password [facility] 08/28/2017, 8:35 AM

## 2017-08-28 NOTE — Progress Notes (Signed)
Pharmacy Antibiotic Note  Tanner [name] is a 62 y.o. male admitted on 08/20/2017 with acute respiratory failure & pain.  He was recently diagnosed with metastatic spindle cell cancer.  Started on Vancomycin and Zosyn for sepsis.  RLL consolidation concerning for pneumonia as possible infectious source.  MRSA PCR negative and blood cultures ngtd.  Narrow to Zosyn 2/19.  Plan: Continue Zosyn 3.375g IV q8h (4 hour infusion time).  Renal function stable.  Dosage remains stable and need for further dosage adjustment appears unlikely at present.  Pharmacy will sign off at this time.  Please reconsult if a change in clinical status warrants re-evaluation of dosage.  Height: 5\' 10"  (177.8 cm) Weight: 158 lb 4.6 oz (71.8 kg) IBW/kg (Calculated) : 73  Temp (24hrs), Avg:98 F (36.7 C), Min:97.7 F (36.5 C), Max:98.3 F (36.8 C)  Recent Labs  Lab 08/25/17 0352 08/25/17 1802 08/25/17 2018 08/25/17 2250 08/26/17 0337 08/27/17 0312 08/28/17 0337  WBC 8.4 11.1*  --   --  9.9 9.7 12.9*  CREATININE 0.64 0.76  --   --  0.70 0.65 0.63  LATICACIDVEN  --   --  3.4* 1.6  --   --   --     Estimated Creatinine Clearance: 101 mL/min (by C-G formula based on SCr of 0.63 mg/dL).    Allergies  Allergen Reactions  . Dilaudid [Hydromorphone Hcl] Nausea And Vomiting    Pre pt history  . Procaine Hcl Other (See Comments)    08/10/2017: The patient indicated that Novocaine was used by a dermatologist prior to obtaining biopsy on his back "many years ago". Patient experienced a syncopal reaction by his report. The patient has since had dental anesthetic without any adverse reactions.   . Sulfa Antibiotics Rash    Antimicrobials this admission: 2/16 Vanc >> 2/19 2/16 Zosyn >>    Dose adjustments this admission:   Microbiology results: 2/16 BCx: NGTD 2/16 UCx: NGF  Thank you for allowing pharmacy to be a part of this patient's care.  Hershal Coria 08/28/2017 1:45 PM

## 2017-08-28 NOTE — Progress Notes (Signed)
Subjective: The patient is seen and examined today.  He is feeling fine today with no specific complaints except for the persistent back pain.  He denied having any chest pain, shortness of breath, cough or hemoptysis.  He denied having any fever or chills.  He has no nausea or vomiting.  Objective: Vital signs in last 24 hours: Temp:  [97.7 F (36.5 C)-98.3 F (36.8 C)] 98.1 F (36.7 C) (02/19 0313) Pulse Rate:  [75-124] 87 (02/19 0700) Resp:  [12-21] 12 (02/19 0700) BP: (99-184)/(69-111) 131/88 (02/19 0700) SpO2:  [93 %-97 %] 96 % (02/19 0700) Weight:  [158 lb 4.6 oz (71.8 kg)] 158 lb 4.6 oz (71.8 kg) (02/18 1831)  Intake/Output from previous day: 02/18 0701 - 02/19 0700 In: 1390 [P.O.:490; IV Piggyback:900] Out: 2827 [Urine:2825; Stool:2] Intake/Output this shift: Total I/O In: 200 [IV Piggyback:200] Out: 150 [Urine:150]  General appearance: alert, cooperative, fatigued and no distress Resp: clear to auscultation bilaterally Cardio: regular rate and rhythm, S1, S2 normal, no murmur, click, rub or gallop GI: soft, non-tender; bowel sounds normal; no masses,  no organomegaly Extremities: extremities normal, atraumatic, no cyanosis or edema  Lab Results:  Recent Labs    08/27/17 0312 08/27/17 1443 08/28/17 0337  WBC 9.7  --  12.9*  HGB 8.2* 8.5* 9.1*  HCT 23.9* 25.2* 27.6*  PLT 184  --  178   BMET Recent Labs    08/27/17 0312 08/28/17 0337  NA 136 132*  K 4.1 4.3  CL 100* 94*  CO2 27 27  GLUCOSE 154* 135*  BUN 23* 21*  CREATININE 0.65 0.63  CALCIUM 8.6* 8.5*    Studies/Results: Ct Angio Chest Pe W Or Wo Contrast  Result Date: 08/26/2017 CLINICAL DATA:  Dyspnea. Chronic. Stage IV cancer of unknown primary. EXAM: CT ANGIOGRAPHY CHEST WITH CONTRAST TECHNIQUE: Multidetector CT imaging of the chest was performed using the standard protocol during bolus administration of intravenous contrast. Multiplanar CT image reconstructions and MIPs were obtained to evaluate  the vascular anatomy. CONTRAST:  124mL ISOVUE-370 IOPAMIDOL (ISOVUE-370) INJECTION 76% COMPARISON:  CT chest 08/20/2017 and CT abdomen and pelvis from 05/04/2017 and PET-CT from 05/11/2017 FINDINGS: Cardiovascular: The heart size appears normal. No pericardial effusion. Aortic atherosclerosis. Calcification in the left main coronary artery and LAD identified. The main pulmonary artery is patent. No lobar or central obstructing pulmonary emboli identified. Respiratory motion artifact diminishes exam detail within the lower lobe pulmonary arteries. No lobar or segmental pulmonary artery filling defects identified to suggest a clinically significant acute pulmonary embolus. Mediastinum/Nodes: The thyroid gland appears normal. The trachea is patent and midline. Normal appearance of the esophagus. The enlarged prevascular lymph node measures 2.7 x 1.7 cm, image 92 of series 3. Unchanged from previous exam. No additional mediastinal or hilar adenopathy. No axillary or supraclavicular adenopathy. Lungs/Pleura: Small left pleural effusion is similar to previous exam. Significant worsening aeration to the right lower lobe secondary to airspace consolidation and atelectasis. Left upper lobe paramediastinal subpleural mass measures 3.2 by 2.2 cm, image 34 of series 2. Previously this measured the same. Scattered ground-glass and tree-in-bud nodularity identified within both lungs. Upper Abdomen: Progressive liver metastasis identified compared with CT from 05/04/2017. Musculoskeletal: Extensive multifocal lytic bone metastases are identified. Large left posterolateral eighth rib expansile lesion measures 7.4 by 3.4 cm, image 182 of series 3. Previously 6.3 by 3.2 cm. Expansile right lateral eighth rib lesion measures 5.6 by 2.4 cm, image 212 of series 3. Previously 3.4 x 2.5 cm. Numerous lytic bone  lesions are identified throughout the thoracic spine. Using the same numbering scheme as the study from 08/20/2017 pathologic  fractures involving T7, T8 and T9 vertebra are again identified. Review of the MIP images confirms the above findings. IMPRESSION: 1. No evidence for acute pulmonary embolus. 2. Interval progression of right lower lobe airspace consolidation in atelectasis. This may in part be contributing to the patient's difficulty with breathing. 3. Bilateral expansile rib lesions have progressed from previous exam. 4. Extensive thoracic spine lytic bone metastasis with multi level pathologic fractures. 5. Similar appearance of enlarged prevascular mediastinal lymph node and left upper lobe subpleural mass. Electronically Signed   By: Kerby Moors M.D.   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 Severe pain. EXAM: MRI LUMBAR SPINE WITHOUT CONTRAST TECHNIQUE: Multiplanar, multisequence MR imaging of the lumbar spine was performed. No intravenous contrast was administered. COMPARISON:  CT Abdomen and Pelvis 08/26/2017.  PET-CT 05/11/2017 FINDINGS: The examination had to be discontinued prior to completion due to severe pain. Only sagittal T1 weighted imaging was obtained. Normal lumbar segmentation demonstrated on the recent CT Abdomen and Pelvis. Malignant appearing decreased T1 bone marrow signal is scattered throughout the visible spinal vertebrae from the T12 to the S2 level. These correspond to predominately lytic osseous lesions on the recent CT. Stable vertebral height and alignment to the CT yesterday. There is early lumbar pedicle involvement on the left at L3 and on the right at L4. There is left lamina involvement at L1. There is no malignant spinal or neural foraminal stenosis identified. Stable visible abdominal viscera. IMPRESSION: 1. The examination had to be discontinued prior to completion due to severe pain. Only sagittal T1 weighted imaging was obtained.  2. Stable lower thoracic and lumbar osseous metastatic disease compared to the CT yesterday. No malignant lumbar spinal or neural impingement identified. Electronically Signed   By: Genevie Ann M.D.   On: 08/27/2017 11:51   Ct Abdomen Pelvis W Contrast  Result Date: 08/26/2017 CLINICAL DATA:  Stage IV cancer of unknown primary.  Abdominal pain. EXAM: CT ABDOMEN AND PELVIS WITH CONTRAST TECHNIQUE: Multidetector CT imaging of the abdomen and pelvis was performed using the standard protocol following bolus administration of intravenous contrast. CONTRAST:  168mL ISOVUE-370 IOPAMIDOL (ISOVUE-370) INJECTION 76% COMPARISON:  05/04/17. FINDINGS: Lower chest: Dense airspace consolidation within the posterior and medial right lung base identified. Small left pleural effusion identified. Hepatobiliary: Progressive multifocal liver metastases identified. Index lesion within the posterior right lobe of liver measures 3.2 cm, image 19 of series 4. New from previous exam. Inferior right lobe of liver metastasis is also new measuring 2.4 cm, image 35 of series 4. Anterior right lobe of liver metastasis measures 2.0 cm, image 29 of series 4. New from previous exam. Gallbladder appears normal. No biliary dilatation. Pancreas: Normal appearance of the pancreas. Spleen: The spleen is unremarkable. Adrenals/Urinary Tract: Normal appearance of both the adrenal glands. Bilateral kidney cysts are again noted. No mass or hydronephrosis visualized. Urinary bladder appears normal. Stomach/Bowel: The stomach is normal. The small bowel loops have a normal course and caliber without evidence for bowel obstruction. No pathologic dilatation of the colon. Vascular/Lymphatic: Aortic atherosclerosis. No aneurysm. No abdominal adenopathy. No pelvic or inguinal adenopathy. Reproductive: Previous prostatectomy. Other: Small supraumbilical ventral abdominal wall hernia contains fat only. Trace free fluid noted within the pelvis. Musculoskeletal:  Multifocal lytic bone  metastases are identified. Progressed from previous exam. Bilateral expansile rib lesions are identified, new from previous exam. Left posterolateral ninth rib lesion measures 3.6 x 2.0 cm, image 15 of series 4. Left posterior eighth rib expansile lesion measures 7 x 4.1 cm. Extensive lytic bone metastases are noted throughout the visualized portions of the thoracic and lumbar spine. Increased from prior exam. L2 lytic lesion with pathologic compression fracture measures 1.7 cm, image 68 of series 8. New from previous exam. Large lytic lesion within the T8 vertebra with associated superior endplate pathologic fracture is new measuring 1.9 cm, image number 68 of series 8. S1 lytic lesion measures 2.2 cm, image 69 of series 8. The previously 0.8 cm. Enlarging lytic lesion involving the posterior cortex of the proximal left femur may be of orthopedic significance measuring 2.5 cm, image 90 of series 4. Previously 0.8 cm. Also of potential orthopedic significance are bilateral inferior pubic rami lytic lesions. IMPRESSION: 1. Interval development of multifocal liver metastasis. 2. Significant interval progression of multifocal lytic bone metastases involving the spine, bony pelvis and left femur. Associated pathologic fractures are noted within the lumbar spine. Lesions involving the left femur and pelvis may be of orthopedic significance. Electronically Signed   By: Kerby Moors M.D.   On: 08/26/2017 17:35   Dg Hip Unilat With Pelvis 2-3 Views Left  Result Date: 08/27/2017 CLINICAL DATA:  Left hip pain without known injury. EXAM: DG HIP (WITH OR WITHOUT PELVIS) 2-3V LEFT COMPARISON:  None. FINDINGS: There is no evidence of hip fracture or dislocation. There is no evidence of arthropathy or other focal bone abnormality. IMPRESSION: Normal left hip. Electronically Signed   By: Marijo Conception, M.D.   On: 08/27/2017 12:10    Medications: I have reviewed the patient's current  medications.  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  The patient is status post 1 cycle of chemotherapy with Doxil.  He tolerated the treatment well but he continues to have significant pain from his bone metastasis. Unfortunately repeat imaging studies including CT scan of the chest, abdomen and pelvis showed significant disease progression with development of multifocal liver metastasis in addition to significant progression of the multifocal lytic bone metastasis involving the spine, bony pelvis and left femur with associated pathologic fractures within the lumbar spine. I had a lengthy discussion with the patient today about his condition. Unfortunately with his current condition he is not a good candidate for any other systemic chemotherapy. I strongly recommended for the patient to consider palliative care and hospice at this point.  The patient is in agreement with the current plan. We will consult the palliative care team to help with his pain management as well as discussion of goals of care and end-of-life care. CODE STATUS, no CODE BLUE Thank you so much for taking good care of Mr. Tanner [name], I would continue to follow-up the patient with you and assist in his management on as-needed basis.   LOS: 7 days    Eilleen Kempf 08/28/2017

## 2017-08-28 NOTE — Consult Note (Signed)
Radiation Oncology         [phone] ________________________________  Name: Tanner [name]         MRN: [id number]  Date of Service: 08/27/17 DOB: [birth year]  RZ:NBVAPOLI, Delphia Grates, NP  No ref. provider found     REFERRING PHYSICIAN: No ref. provider found   DIAGNOSIS: The primary encounter diagnosis was Hypoxia. Diagnoses of Metastatic cancer ([place]) and Pain were also pertinent to this visit.   HISTORY OF PRESENT ILLNESS: Tanner [name] is a 62 y.o. male was originally seen in [facility] thoracic clinic for a new suspected stage IV lung cancer. The patient presented to the hospital with a syncopal episode and a mass in the left lung was noted. He underwent CTA of the chest on 05/03/17 revealing a 2 cm mass in the LUL. He also had some prevascular adenopathy, and a lesion in the left scapula. He had a brain MRI on 05/03/17 which was negative for metastatic disease. CT of the abdomen/pelvis and a PET scan. The PET revealed diffuse metastatic bone involvement particularly in bilateral scapula, T spine, L spine, and bilateral pelvic bones including the iliac wings. He comes to proceed with treatment, and has met with Dr. Julien Nordmann. He underwent biopsy of his right iliac bone on 07/05/17 revealing poorly differentiated spindle cell malignancy. He received palliative radiotherapy between 06/06/17 and 06/11/18. And has received 1 cycle of Doxil chemotherapy on 07/31/17. He presented to the hospital with progressive pain and has been found to have widely metastatic disease on CT imaging and limited MRI due to pain shows progressive disease in the spine as well. To summarize his treatment, he's received palliative treatment to bilateral proximal femurs, the chest including the left scapula, primary tumor, and T1 area of the thoracic spine, and to the iliac bone on the right.  PREVIOUS RADIATION THERAPY:   06/06/17-06/22/17: 30 Gy in 10 fractions to Chest including primary LUL tumor, left scapula, and  T1 Bilateral femurs Right iliac bone  06/27/17-07/12/17:  Lumbar spine 30 Gy in 10 fractions  PAST MEDICAL HISTORY:      Past Medical History:  Diagnosis Date  . Alcohol abuse   . Alcoholic hepatitis   . Allergy   . Anxiety   . 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 C. difficile colitis   . Depression   . DVT (deep venous thrombosis) ([place]) 2015  . GERD (gastroesophageal reflux disease)   . Hypertension   . Ileus ([employer]) 09/2013  . 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 Shortness of breath    new onset- occasionally-at rest and exertion  . Substance abuse ([place])   . TIA (transient ischemic attack) 08/2013       PAST SURGICAL HISTORY:      Past Surgical History:  Procedure Laterality Date  . HAND SURGERY Right   . HERNIA REPAIR    . INTRAMEDULLARY (IM) NAIL INTERTROCHANTERIC Right 08/17/2016   Procedure: INTRAMEDULLARY (IM) NAIL RIGHT HIP;  Surgeon: Tania Ade, MD;  Location: [place];  Service: Orthopedics;  Laterality: Right;  . LYMPHADENECTOMY Bilateral 08/29/2013   Procedure: LYMPHADENECTOMY "BILATERAL PELVIC LYMPH NODE DISSECTION";  Surgeon: Molli Hazard, MD;  Location: [facility];  Service: Urology;  Laterality: Bilateral;  . ORIF ANKLE FRACTURE Right 12/10/2012   Procedure: OPEN REDUCTION INTERNAL FIXATION (ORIF) ANKLE FRACTURE;  Surgeon: Hessie Dibble, MD;  Location: [facility];  Service: Orthopedics;  Laterality: Right;  . ROBOT ASSISTED LAPAROSCOPIC RADICAL PROSTATECTOMY N/A 08/29/2013  Procedure: ROBOTIC ASSISTED LAPAROSCOPIC RADICAL PROSTATECTOMY LEVEL 2, Lysis of adhesions;  Surgeon: Molli Hazard, MD;  Location: [facility];  Service: Urology;  Laterality: N/A;     FAMILY HISTORY:       Family History  Problem Relation Age of Onset  . Lung cancer Father        smoker  . COPD Father   . Emphysema Father   . Arthritis Mother   . Pancreatic cancer Sister      SOCIAL HISTORY:  reports that he quit smoking  about 5 months ago. His smoking use included cigarettes. He has a 45.00 pack-year smoking history. he has never used smokeless tobacco. He reports that he drinks about 3.0 - 6.0 oz of alcohol per week. He reports that he uses drugs. Drug: Marijuana. The patient is divorced and lives in [place]. He is accompanied by his  daughter, and ex wife.   ALLERGIES: Dilaudid [hydromorphone hcl]; Procaine hcl; and Sulfa antibiotics   MEDICATIONS:           Current Facility-Administered Medications  Medication Dose Route Frequency Provider Last Rate Last Dose  . 0.9 %  sodium chloride infusion  250 mL Intravenous PRN Derrill Kay A, MD      . acetaminophen (TYLENOL) tablet 1,000 mg  1,000 mg Oral Q8H Elodia Florence., MD   1,000 mg at 08/28/17 0600  . albuterol (PROVENTIL) (2.5 MG/3ML) 0.083% nebulizer solution 2.5 mg  2.5 mg Nebulization Q2H PRN Derrill Kay A, MD   2.5 mg at 08/25/17 0600  . bisacodyl (DULCOLAX) suppository 10 mg  10 mg Rectal Daily PRN Pershing Proud, NP      . dexamethasone (DECADRON) tablet 8 mg  8 mg Oral P5W Vinie Sill C, NP   8 mg at 08/28/17 0600  . diazepam (VALIUM) tablet 5 mg  5 mg Oral BID PRN Phillips Grout, MD   5 mg at 08/27/17 1938  . diclofenac sodium (VOLTAREN) 1 % transdermal gel 2 g  2 g Topical QID Elodia Florence., MD   2 g at 08/27/17 2212  . docusate sodium (COLACE) capsule 100 mg  100 mg Oral BID Derrill Kay A, MD   100 mg at 08/25/17 2241  . dronabinol (MARINOL) capsule 5 mg  5 mg Oral BID AC Elodia Florence., MD      . enoxaparin (LOVENOX) injection 40 mg  40 mg Subcutaneous Daily Elodia Florence., MD   40 mg at 08/27/17 1011  . feeding supplement (ENSURE ENLIVE) (ENSURE ENLIVE) liquid 237 mL  237 mL Oral TID BM Elodia Florence., MD   237 mL at 08/27/17 1937  . fentaNYL (SUBLIMAZE) injection 100 mcg  100 mcg Intravenous Q2H PRN Gardiner Barefoot, NP   100 mcg at 08/28/17 0600  . Gerhardt's butt cream   Topical  TID PRN Gardiner Barefoot, NP      . guaiFENesin (MUCINEX) 12 hr tablet 600 mg  600 mg Oral BID Derrill Kay A, MD   600 mg at 08/27/17 2218  . ipratropium-albuterol (DUONEB) 0.5-2.5 (3) MG/3ML nebulizer solution 3 mL  3 mL Nebulization TID Elodia Florence., MD   3 mL at 08/27/17 2045  . lidocaine (LIDODERM) 5 % 1 patch  1 patch Transdermal Q24H Elodia Florence., MD   1 patch at 08/27/17 2228  . multivitamin with minerals tablet 1 tablet  1 tablet Oral Daily Elodia Florence., MD      .  naloxone [facility]) injection 0.4 mg  0.4 mg Intravenous PRN Elodia Florence., MD   0.4 mg at 08/26/17 0251  . ondansetron (ZOFRAN) injection 4 mg  4 mg Intravenous Q6H PRN Lovey Newcomer T, NP   4 mg at 08/28/17 0740  . ondansetron (ZOFRAN) tablet 8 mg  8 mg Oral Q8H PRN Derrill Kay A, MD      . oxyCODONE (Oxy IR/ROXICODONE) immediate release tablet 15 mg  15 mg Oral Q6H PRN Elodia Florence., MD   15 mg at 08/28/17 0359  . pantoprazole (PROTONIX) EC tablet 40 mg  40 mg Oral Daily Derrill Kay A, MD   40 mg at 08/27/17 1011  . piperacillin-tazobactam (ZOSYN) IVPB 3.375 g  3.375 g Intravenous Q8H Thomes Lolling, RPH 12.5 mL/hr at 08/28/17 0740 3.375 g at 08/28/17 0740  . polyethylene glycol (MIRALAX / GLYCOLAX) packet 17 g  17 g Oral Daily Derrill Kay A, MD   17 g at 08/25/17 1008  . pregabalin (LYRICA) capsule 150 mg  150 mg Oral BID Pershing Proud, NP   740 mg at 08/27/17 2210  . PRUTECT emulsion 1 application  1 application Topical BID Phillips Grout, MD   1 application at 81/44/81 0913  . senna (SENOKOT) tablet 17.2 mg  2 tablet Oral BID Micheline Rough, MD   17.2 mg at 08/25/17 2236  . simvastatin (ZOCOR) tablet 20 mg  20 mg Oral q1800 Caren Griffins, MD   20 mg at 08/27/17 1740  . sodium chloride flush (NS) 0.9 % injection 3 mL  3 mL Intravenous Q12H Derrill Kay A, MD   3 mL at 08/27/17 2235  . sodium chloride flush (NS) 0.9 % injection 3 mL  3 mL Intravenous PRN  Derrill Kay A, MD      . thiamine (VITAMIN B-1) tablet 100 mg  100 mg Oral Daily Derrill Kay A, MD   100 mg at 08/24/17 0912  . traZODone (DESYREL) tablet 50 mg  50 mg Oral QHS PRN Phillips Grout, MD   50 mg at 08/26/17 2136  . vancomycin (VANCOCIN) IVPB 1000 mg/200 mL premix  1,000 mg Intravenous Q12H Thomes Lolling, [facility]   Stopped at 08/28/17 8563     REVIEW OF SYSTEMS: On review of systems, the patient reports that he is not doing well due to progressive back pain. He reports he is having severe pain in the mid and low back. He reports left hip pain and denies any chest wall or rib pain. He denies any sternal chest pain, shortness of breath, cough, fevers, chills, night sweats.   PHYSICAL EXAM:     Wt Readings from Last 3 Encounters:  08/27/17 158 lb 4.6 oz (71.8 kg)  07/31/17 153 lb 8 oz (69.6 kg)  07/16/17 160 lb 3.2 oz (72.7 kg)      Temp Readings from Last 3 Encounters:  08/28/17 98.1 F (36.7 C) (Oral)  08/14/17 98.5 F (36.9 C) (Oral)  08/10/17 98.7 F (37.1 C)      BP Readings from Last 3 Encounters:  08/28/17 131/88  08/14/17 (!) 95/58  08/10/17 (!) 80/58      Pulse Readings from Last 3 Encounters:  08/28/17 87  08/14/17 81  08/10/17 92   Pain Assessment Pain Score: 7  In general this is a well appearing caucasian male in no acute distress. He is alert and oriented x4 and appropriate throughout the examination. HEENT reveals that the patient is normocephalic,  atraumatic. EOMs are intact. PERRLA. Skin is intact without any evidence of gross lesions. Cardiovascular exam reveals a regular rate and rhythm, no clicks rubs or murmurs are auscultated. Chest is clear to auscultation bilaterally. Lymphatic assessment is performed and does not reveal any adenopathy in the cervical, supraclavicular, axillary, or inguinal chains. Abdomen has active bowel sounds in all quadrants and is intact. The abdomen is soft, non tender, non distended. Lower extremities  are negative for pretibial pitting edema, deep calf tenderness, cyanosis or clubbing. He has 5/5 strength in bilateral upper and lower extremities and intact sensation to light touch equally. Grip strength is equal bilaterally and he does not have any weakness or decreased sensation in the chest wall to light touch.  ECOG = 4  0 - Asymptomatic (Fully active, able to carry on all predisease activities without restriction)  1 - Symptomatic but completely ambulatory (Restricted in physically strenuous activity but ambulatory and able to carry out work of a light or sedentary nature. For example, light housework, office work)  2 - Symptomatic, <50% in bed during the day (Ambulatory and capable of all self care but unable to carry out any work activities. Up and about more than 50% of waking hours)  3 - Symptomatic, >50% in bed, but not bedbound (Capable of only limited self-care, confined to bed or chair 50% or more of waking hours)  4 - Bedbound (Completely disabled. Cannot carry on any self-care. Totally confined to bed or chair)  5 - Death   Eustace Pen MM, Creech RH, Tormey DC, et al. [phone]). "Toxicity and response criteria of the [facility]". [place] Oncol. 5 (6): 649-55    LABORATORY DATA:  RecentLabs       Lab Results  Component Value Date   WBC 12.9 (H) 08/28/2017   HGB 9.1 (L) 08/28/2017   HCT 27.6 (L) 08/28/2017   MCV 96.8 08/28/2017   PLT 178 08/28/2017     RecentLabs       Lab Results  Component Value Date   NA 132 (L) 08/28/2017   K 4.3 08/28/2017   CL 94 (L) 08/28/2017   CO2 27 08/28/2017     RecentLabs       Lab Results  Component Value Date   ALT 24 08/28/2017   AST 26 08/28/2017   ALKPHOS 67 08/28/2017   BILITOT 0.5 08/28/2017        RADIOGRAPHY:  ImagingResults  Dg Chest 2 View  Result Date: 08/20/2017 CLINICAL DATA:  Dyspnea EXAM: CHEST  2 VIEW COMPARISON:  05/02/2017 chest radiograph.  FINDINGS: Stable cardiomediastinal silhouette with normal heart size. No pneumothorax. No pleural effusion. New patchy left lower lobe lung opacity. No pulmonary edema. Stable chronic mid thoracic vertebral compression fracture with associated kyphotic angulation. IMPRESSION: New patchy left lower lobe lung opacity, which may represent a pneumonia in the correct clinical setting. Recommend attention on short-term follow-up post treatment chest imaging. Electronically Signed   By: Ilona Sorrel M.D.   On: 08/20/2017 19:42   Dg Shoulder Right  Result Date: 08/22/2017 CLINICAL DATA:  Chronic shoulder pain EXAM: RIGHT SHOULDER - 2+ VIEW COMPARISON:  None. FINDINGS: There is no evidence of fracture or dislocation. There is generalized osteopenia. There is no evidence of arthropathy or other focal bone abnormality. Soft tissues are unremarkable. IMPRESSION: No acute osseous injury of the right shoulder. Electronically Signed   By: Kathreen Devoid   On: 08/22/2017 10:11   Ct Angio Chest Pe W Or  Wo Contrast  Result Date: 08/26/2017 CLINICAL DATA:  Dyspnea. Chronic. Stage IV cancer of unknown primary. EXAM: CT ANGIOGRAPHY CHEST WITH CONTRAST TECHNIQUE: Multidetector CT imaging of the chest was performed using the standard protocol during bolus administration of intravenous contrast. Multiplanar CT image reconstructions and MIPs were obtained to evaluate the vascular anatomy. CONTRAST:  132m ISOVUE-370 IOPAMIDOL (ISOVUE-370) INJECTION 76% COMPARISON:  CT chest 08/20/2017 and CT abdomen and pelvis from 05/04/2017 and PET-CT from 05/11/2017 FINDINGS: Cardiovascular: The heart size appears normal. No pericardial effusion. Aortic atherosclerosis. Calcification in the left main coronary artery and LAD identified. The main pulmonary artery is patent. No lobar or central obstructing pulmonary emboli identified. Respiratory motion artifact diminishes exam detail within the lower lobe pulmonary arteries. No lobar or segmental  pulmonary artery filling defects identified to suggest a clinically significant acute pulmonary embolus. Mediastinum/Nodes: The thyroid gland appears normal. The trachea is patent and midline. Normal appearance of the esophagus. The enlarged prevascular lymph node measures 2.7 x 1.7 cm, image 92 of series 3. Unchanged from previous exam. No additional mediastinal or hilar adenopathy. No axillary or supraclavicular adenopathy. Lungs/Pleura: Small left pleural effusion is similar to previous exam. Significant worsening aeration to the right lower lobe secondary to airspace consolidation and atelectasis. Left upper lobe paramediastinal subpleural mass measures 3.2 by 2.2 cm, image 34 of series 2. Previously this measured the same. Scattered ground-glass and tree-in-bud nodularity identified within both lungs. Upper Abdomen: Progressive liver metastasis identified compared with CT from 05/04/2017. Musculoskeletal: Extensive multifocal lytic bone metastases are identified. Large left posterolateral eighth rib expansile lesion measures 7.4 by 3.4 cm, image 182 of series 3. Previously 6.3 by 3.2 cm. Expansile right lateral eighth rib lesion measures 5.6 by 2.4 cm, image 212 of series 3. Previously 3.4 x 2.5 cm. Numerous lytic bone lesions are identified throughout the thoracic spine. Using the same numbering scheme as the study from 08/20/2017 pathologic fractures involving T7, T8 and T9 vertebra are again identified. Review of the MIP images confirms the above findings. IMPRESSION: 1. No evidence for acute pulmonary embolus. 2. Interval progression of right lower lobe airspace consolidation in atelectasis. This may in part be contributing to the patient's difficulty with breathing. 3. Bilateral expansile rib lesions have progressed from previous exam. 4. Extensive thoracic spine lytic bone metastasis with multi level pathologic fractures. 5. Similar appearance of enlarged prevascular mediastinal lymph node and left upper  lobe subpleural mass. Electronically Signed   By: TKerby MoorsM.D.   On: 08/26/2017 17:54   Ct Angio Chest Pe W And/or Wo Contrast  Result Date: 08/20/2017 CLINICAL DATA:  Dyspnea and diffuse pain. Patient is on chemotherapy for bone and lung cancer. EXAM: CT ANGIOGRAPHY CHEST WITH CONTRAST TECHNIQUE: Multidetector CT imaging of the chest was performed using the standard protocol during bolus administration of intravenous contrast. Multiplanar CT image reconstructions and MIPs were obtained to evaluate the vascular anatomy. CONTRAST:  735mISOVUE-370 IOPAMIDOL (ISOVUE-370) INJECTION 76% COMPARISON:  PET-CT 05/11/2017, chest CT 05/03/2017. FINDINGS: Cardiovascular: Heart is normal in size. No pericardial effusion is identified. There is coronary arteriosclerosis along the left main and LAD. No acute pulmonary embolus. Satisfactory opacification of the pulmonary arteries. There is minimal aortic atherosclerosis. No aneurysm or dissection. Mediastinum/Nodes: Interval increase in prevascular lymph node measuring 2.8 x 1.7 cm, series 4, image 27 versus 1.1 x 0.5 cm on prior chest CT. Trachea and mainstem bronchi appear patent. The thoracic esophagus is unremarkable. No thyromegaly or mass. No supraclavicular, axillary nor hilar  lymphadenopathy. Lungs/Pleura: Interval increase in size of previously noted medial left upper lobe mass abutting the superior mediastinum, currently estimated at 3.2 x 2.2 x 2.1 cm previously estimated at 2.2 x 2.1 x 1.4 cm. No additional pulmonary masses. There is atelectasis along the posterior aspect of both lower lobes. Upper Abdomen: Inhomogeneous appearance of the included right hepatic lobe concerning for metastasis. Included left adrenal gland is unremarkable. The right adrenal gland is excluded. Musculoskeletal: The following osteolytic metastatic foci and/or fractures are noted: 1. Left glenoid neck. 2. Right lateral third and sixth rib fractures likely pathologic. Left lateral  sixth rib fracture. 3. Bony expansile 3.7 x 2.8 cm right lateral fifth rib mass, bony expansile lytic 3.3 x 2.5 cm right posterolateral seventh rib and 1.5 x 2.4 cm right lateral eighth rib lytic masses. 4. 6.8 x 3.4 cm bony expansile lytic mass of the posterior left eighth rib likely accounting for the opacity seen radiographically, expansile lytic 3.8 x 2 cm left posterolateral ninth rib mass. 5. Redemonstration of lower thoracic kyphosis with multilevel lytic lesions of the thoracic spine namely T2 through T5, T7, T9 and T10. Review of the MIP images confirms the above findings. IMPRESSION: 1. The left lower lobe opacity seen on same day chest radiograph is not attributable to a pneumonia but due to a bony expansile lytic lesion involving the posterior left eighth rib measuring 6.8 x 3.4 cm. Additional bony expansile lytic metastasis involving numerous bilateral ribs as above described with osteolytic lesions of the thoracic spine are in keeping with metastasis. Additional osteolytic abnormalities involving the left glenoid neck with pathologic bilateral rib fractures. 2. Ill-defined partially included hypodensities in the right hepatic lobe are concerning for hepatic metastasis, also new since prior. 3. Interval increase in left upper lobe paramediastinal mass now measuring 3.2 x 2.2 x 2.1 cm versus 2.2 x 2.1 x 1.4 cm previously. Interval increase size of a prevascular lymph node the abutting the aortic arch now measuring 2.8 x 1.7 cm versus 1.1 x 0.5 cm on prior chest CT. 4. No acute pulmonary embolus. 5. Coronary arteriosclerosis. Electronically Signed   By: Ashley Royalty M.D.   On: 08/20/2017 22:07   Mr Lumbar Spine Wo Contrast  Result Date: 08/27/2017 CLINICAL DATA:  62 year old male with recently diagnosed stage IV portion poorly differentiated spindle cell carcinoma. Severe pain. EXAM: MRI LUMBAR SPINE WITHOUT CONTRAST TECHNIQUE: Multiplanar, multisequence MR imaging of the lumbar spine was performed.  No intravenous contrast was administered. COMPARISON:  CT Abdomen and Pelvis 08/26/2017.  PET-CT 05/11/2017 FINDINGS: The examination had to be discontinued prior to completion due to severe pain. Only sagittal T1 weighted imaging was obtained. Normal lumbar segmentation demonstrated on the recent CT Abdomen and Pelvis. Malignant appearing decreased T1 bone marrow signal is scattered throughout the visible spinal vertebrae from the T12 to the S2 level. These correspond to predominately lytic osseous lesions on the recent CT. Stable vertebral height and alignment to the CT yesterday. There is early lumbar pedicle involvement on the left at L3 and on the right at L4. There is left lamina involvement at L1. There is no malignant spinal or neural foraminal stenosis identified. Stable visible abdominal viscera. IMPRESSION: 1. The examination had to be discontinued prior to completion due to severe pain. Only sagittal T1 weighted imaging was obtained. 2. Stable lower thoracic and lumbar osseous metastatic disease compared to the CT yesterday. No malignant lumbar spinal or neural impingement identified. Electronically Signed   By: Herminio Heads.D.  On: 08/27/2017 11:51   Ct Abdomen Pelvis W Contrast  Result Date: 08/26/2017 CLINICAL DATA:  Stage IV cancer of unknown primary.  Abdominal pain. EXAM: CT ABDOMEN AND PELVIS WITH CONTRAST TECHNIQUE: Multidetector CT imaging of the abdomen and pelvis was performed using the standard protocol following bolus administration of intravenous contrast. CONTRAST:  143m ISOVUE-370 IOPAMIDOL (ISOVUE-370) INJECTION 76% COMPARISON:  05/04/17. FINDINGS: Lower chest: Dense airspace consolidation within the posterior and medial right lung base identified. Small left pleural effusion identified. Hepatobiliary: Progressive multifocal liver metastases identified. Index lesion within the posterior right lobe of liver measures 3.2 cm, image 19 of series 4. New from previous exam. Inferior right  lobe of liver metastasis is also new measuring 2.4 cm, image 35 of series 4. Anterior right lobe of liver metastasis measures 2.0 cm, image 29 of series 4. New from previous exam. Gallbladder appears normal. No biliary dilatation. Pancreas: Normal appearance of the pancreas. Spleen: The spleen is unremarkable. Adrenals/Urinary Tract: Normal appearance of both the adrenal glands. Bilateral kidney cysts are again noted. No mass or hydronephrosis visualized. Urinary bladder appears normal. Stomach/Bowel: The stomach is normal. The small bowel loops have a normal course and caliber without evidence for bowel obstruction. No pathologic dilatation of the colon. Vascular/Lymphatic: Aortic atherosclerosis. No aneurysm. No abdominal adenopathy. No pelvic or inguinal adenopathy. Reproductive: Previous prostatectomy. Other: Small supraumbilical ventral abdominal wall hernia contains fat only. Trace free fluid noted within the pelvis. Musculoskeletal: Multifocal lytic bone metastases are identified. Progressed from previous exam. Bilateral expansile rib lesions are identified, new from previous exam. Left posterolateral ninth rib lesion measures 3.6 x 2.0 cm, image 15 of series 4. Left posterior eighth rib expansile lesion measures 7 x 4.1 cm. Extensive lytic bone metastases are noted throughout the visualized portions of the thoracic and lumbar spine. Increased from prior exam. L2 lytic lesion with pathologic compression fracture measures 1.7 cm, image 68 of series 8. New from previous exam. Large lytic lesion within the T8 vertebra with associated superior endplate pathologic fracture is new measuring 1.9 cm, image number 68 of series 8. S1 lytic lesion measures 2.2 cm, image 69 of series 8. The previously 0.8 cm. Enlarging lytic lesion involving the posterior cortex of the proximal left femur may be of orthopedic significance measuring 2.5 cm, image 90 of series 4. Previously 0.8 cm. Also of potential orthopedic significance  are bilateral inferior pubic rami lytic lesions. IMPRESSION: 1. Interval development of multifocal liver metastasis. 2. Significant interval progression of multifocal lytic bone metastases involving the spine, bony pelvis and left femur. Associated pathologic fractures are noted within the lumbar spine. Lesions involving the left femur and pelvis may be of orthopedic significance. Electronically Signed   By: TKerby MoorsM.D.   On: 08/26/2017 17:35   Dg Chest Port 1 View  Result Date: 08/25/2017 CLINICAL DATA:  Right-sided chest pain with deep breathing. Shortness of breath and cough, recently diagnosed with stage IV cancer. History of hypertension. EXAM: PORTABLE CHEST 1 VIEW COMPARISON:  Chest x-ray from earlier same day and chest x-ray dated 08/20/2017. Chest CT dated 08/20/2017. FINDINGS: Cardiomediastinal silhouette is stable in size and configuration. Again noted are the known chest wall metastases bilaterally. No new lung findings to suggest a developing pneumonia. No pleural effusion or pneumothorax seen. IMPRESSION: Stable chest x-ray. No evidence of pneumonia or pulmonary edema. Known chest wall metastases again noted bilaterally. Electronically Signed   By: SFranki CabotM.D.   On: 08/25/2017 18:05   [facility]  Result Date: 08/25/2017 CLINICAL DATA:  Shortness of breath and hypoxia. Metastatic bone disease. EXAM: PORTABLE CHEST 1 VIEW COMPARISON:  08/20/2017 and CT 08/20/2017 FINDINGS: Lungs are adequately inflated without focal airspace consolidation or effusion. Evidence of patient's known metastatic lesions over the right lateral thorax and posterolateral right chest wall. Cardiomediastinal silhouette and remainder of the exam is unchanged. IMPRESSION: No acute cardiopulmonary disease. Known chest wall metastatic disease. Electronically Signed   By: Marin Olp M.D.   On: 08/25/2017 13:52   Dg Hip Unilat With Pelvis 2-3 Views Left  Result Date: 08/27/2017 CLINICAL DATA:   Left hip pain without known injury. EXAM: DG HIP (WITH OR WITHOUT PELVIS) 2-3V LEFT COMPARISON:  None. FINDINGS: There is no evidence of hip fracture or dislocation. There is no evidence of arthropathy or other focal bone abnormality. IMPRESSION: Normal left hip. Electronically Signed   By: Marijo Conception, M.D.   On: 08/27/2017 12:10        IMPRESSION/PLAN: 1.         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 I spoke with the patient and examined him. He appears to be neurologically intact and has been receiving dexamthasone. He does not have radiographic evidence currently of epidural involvement of his spinal disease, but I will ask Dr. Lisbeth Renshaw to evaluate his films on Tuesday 2/19 when he returns to the office. The patient recently completed radiotherapy to bilateral femurs including the pelvis, Lower lumbar spine, and chest.     Carola Rhine, PAC

## 2017-08-28 NOTE — Progress Notes (Signed)
Daily Progress Note   Patient Name: Tanner [name]       Date: 08/28/2017 DOB: 04/08/1956  Age: 62 y.o. MRN#: [id number] Attending Physician: Elodia Florence., * Primary Care Physician: [name], NP Admit Date: 08/20/2017  Reason for Consultation/Follow-up: Pain control, GOC  Subjective: Tanner [name] continues to have pain in ribs and back but says currently controlled on regimen.    Length of Stay: 7  Current Medications: Scheduled Meds:  . acetaminophen  1,000 mg Oral Q8H  . dexamethasone  8 mg Oral Q8H  . diclofenac sodium  2 g Topical QID  . docusate sodium  100 mg Oral BID  . dronabinol  5 mg Oral TID AC  . enoxaparin (LOVENOX) injection  40 mg Subcutaneous Daily  . feeding supplement (ENSURE ENLIVE)  237 mL Oral TID BM  . guaiFENesin  600 mg Oral BID  . ipratropium-albuterol  3 mL Nebulization TID  . lidocaine  1 patch Transdermal Q24H  . multivitamin with minerals  1 tablet Oral Daily  . pantoprazole  40 mg Oral Daily  . polyethylene glycol  17 g Oral Daily  . pregabalin  150 mg Oral BID  . PRUTECT  1 application Topical BID  . senna  2 tablet Oral BID  . simvastatin  20 mg Oral q1800  . sodium chloride flush  3 mL Intravenous Q12H  . thiamine  100 mg Oral Daily    Continuous Infusions: . sodium chloride    . piperacillin-tazobactam (ZOSYN)  IV 3.375 g (08/28/17 0740)  . vancomycin Stopped (08/28/17 0704)    PRN Meds: sodium chloride, albuterol, bisacodyl, calcium carbonate, diazepam, fentaNYL (SUBLIMAZE) injection, Gerhardt's butt cream, naLOXone (NARCAN)  injection, ondansetron (ZOFRAN) IV, ondansetron, oxyCODONE, sodium chloride flush, traZODone  Physical Exam  Constitutional: He is oriented to person, place, and time. He appears well-developed. He  appears ill.  HENT:  Head: Normocephalic and atraumatic.  Cardiovascular: Normal rate and regular rhythm.  Pulmonary/Chest: No accessory muscle usage. No tachypnea. No respiratory distress.  Oxygen needs decreasing  Abdominal: Normal appearance.  Neurological: He is alert and oriented to person, place, and time.  Nursing note and vitals reviewed.           Vital Signs: BP 131/88 (BP Location: Right Arm)   Pulse 87   Temp 98  F (36.7 C) (Oral)   Resp 12   Ht 5' 10"  (1.778 m)   Wt 71.8 kg (158 lb 4.6 oz)   SpO2 96%   BMI 22.71 kg/m  SpO2: SpO2: 96 % O2 Device: O2 Device: Nasal Cannula O2 Flow Rate: O2 Flow Rate (L/min): 2 L/min  Intake/output summary:   Intake/Output Summary (Last 24 hours) at 08/28/2017 0931 Last data filed at 08/28/2017 0800 Gross per 24 hour  Intake 1190 ml  Output 2977 ml  Net -1787 ml   LBM: Last BM Date: 08/27/17 Baseline Weight: Weight: 68 kg (150 lb) Most recent weight: Weight: 71.8 kg (158 lb 4.6 oz)       Palliative Assessment/Data: 30%      Patient Active Problem List   Diagnosis Date Noted  . Malnutrition of moderate degree 08/24/2017  . Cancer associated pain   . Palliative care encounter   . Hypoxia 08/20/2017  . Acute respiratory failure with hypoxia ([place]) 08/20/2017  . COPD (chronic obstructive pulmonary disease) ([place]) 08/20/2017  . Constipation 08/14/2017  . Malignant tumor, spindle cell type ([place]) 07/16/2017  . Goals of care, counseling/discussion 07/16/2017  . Chronic low back pain 07/04/2017  . Bone metastasis ([place]) 06/23/2017  . COPD with acute exacerbation ([place]) 05/15/2017  . Multiple lung nodules on CT   . Near syncope 05/03/2017  . Lung mass 05/03/2017  . Hyperlipidemia 09/20/2016  . Protein-calorie malnutrition ([place]) 08/17/2016  . Thoracic compression fracture, closed, initial encounter ([place]) 08/17/2016  . Alcohol dependence ([place]) 08/17/2016  . Tobacco dependence 08/17/2016  . Osteoporosis 08/17/2016  . Hip  fracture ([place]) 08/16/2016  . Headache 02/24/2016  . Hypertensive urgency 02/03/2016  . Shortness of breath 07/28/2015  . Generalized anxiety disorder 07/28/2015  . Medicare annual wellness visit, subsequent 05/24/2015  . Acute DVT of right tibial vein ([place]) 02/27/2015  . Tibia fracture 02/20/2015  . Syncope 02/18/2015  . Essential hypertension 02/18/2015  . History of stroke 02/18/2015  . Fracture of tibia, right, closed   . C. difficile colitis 09/11/2013  . UTI (urinary tract infection) 09/11/2013  . Essential hypertension, benign 09/11/2013  . Ileus ([place]) 09/10/2013  . 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 Ileus, postoperative ([place]) 09/09/2013  . Hypokalemia 09/09/2013  . Nonspecific (abnormal) findings on radiological and other examination of gastrointestinal tract 09/09/2013  . CVA (cerebral infarction) 09/05/2013  . 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 Malignant neoplasm of prostate ([place]) 08/29/2013  . Hyponatremia 12/11/2012    Class: Chronic  . Ankle fracture 12/10/2012    Palliative Care Assessment & Plan   HPI: 62 y.o. 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 Palliative care requested for assistance in pain management.   Assessment: I met today with Tanner [name] and he is very tearful as he shares that he has spoken with Dr. Julien Nordmann and his prognosis is very poor. Tanner [name] dreads having to tell his daughter "that I only have about a month to live." We discussed more how we can support him and try and make his time more peaceful and quality time with his family. This is Tanner [name]'s goal now is pain management and spending time with his family although he is  still hopeful that he will meet his grandchild due  next month. He talks of missing his grandchild that "I may be on my way to heaven when the baby is on it's way from heaven" and "passing the child" on the way. Emotional support provided to Tanner [name] and to daughter who joined [place] at bedside.   I will continue to work on Tanner [name]'s pain control - I did explain that I do not believe he will be able to tolerate activity and goal should be to just relax and maintain adequate pain relief at rest as this should allow him more wakefulness time to spend with his family. Over next couple days will be better to evaluate and recommend home with hospice vs hospice facility appropriateness. Will keep foley catheter per patient request for comfort.   Recommendations/Plan:  Bone mets pain:   OxyIR to 15 mg every 4 hours prn severe pain.   Fentanyl IV 100 mg every 2 hours prn for severe breakthrough pain. He reports this helps but does not last long. Titrate down tomorrow once fentanyl patch in his system (this will take 12-24 hrs).   He has utilized total 825 mcg/24 hours IV fentanyl alone so I will place fentanyl duragesic patch at 25 mcg/hr (total 600 mcg/24 hrs).   Continue decadron 8 mg po TID for now.   Continue Lyrica 150 po BID.   He is not interested in pursuing any further radiation even if indicated.   LBM: Small hard BM 2/18. Senokot-S 2 tabs and miralax daily. Dulcolax supp daily prn.     Goals of Care and Additional Recommendations:  Limitations on Scope of Treatment: Focus on comfort  Code Status:  DNR   Prognosis:   Extremely poor with progressing advanced cancer and worsening pain and functional status. With current symptom burden he may likely be eligible for hospice facility.    Discharge Planning:  Will likely need hospice care. Hopefully hospice at home if pain can be better managed again.   Care plan was discussed with Dr. Florene Glen, RN  Thank you for allowing the Palliative  Medicine Team to assist in the care of this patient.   Total Time 40 min Prolonged Time Billed  no       Greater than 50%  of this time was spent counseling and coordinating care related to the above assessment and plan.  Vinie Sill, NP Palliative Medicine Team Pager # [phone] (M-F 8a-5p) Team Phone # [phone] (Nights/Weekends)

## 2017-08-28 NOTE — Progress Notes (Deleted)
Radiation Oncology         [phone]) [phone] ________________________________  Name: [name]        MRN: [id number]  Date of Service: 08/20/2017 DOB: 1956/02/28  MV:[id number], Tanner Grates, NP  No ref. provider found     REFERRING PHYSICIAN: No ref. provider found   DIAGNOSIS: The primary encounter diagnosis was Hypoxia. Diagnoses of Metastatic cancer ([place]) and Pain were also pertinent to this visit.   HISTORY OF PRESENT ILLNESS: Tanner [name] is a 62 y.o. male was originally seen in [facility] thoracic clinic for a new suspected stage IV lung cancer. The patient presented to the hospital with a syncopal episode and a mass in the left lung was noted. He underwent CTA of the chest on 05/03/17 revealing a 2 cm mass in the LUL. He also had some prevascular adenopathy, and a lesion in the left scapula. He had a brain MRI on 05/03/17 which was negative for metastatic disease. CT of the abdomen/pelvis and a PET scan. The PET revealed diffuse metastatic bone involvement particularly in bilateral scapula, T spine, L spine, and bilateral pelvic bones including the iliac wings. He comes to proceed with treatment, and has met with Dr. Julien Nordmann. He underwent biopsy of his right iliac bone on 07/05/17 revealing poorly differentiated spindle cell malignancy. He received palliative radiotherapy between 06/06/17 and 06/11/18. And has received 1 cycle of Doxil chemotherapy on 07/31/17. He presented to the hospital with progressive pain and has been found to have widely metastatic disease on CT imaging and limited MRI due to pain shows progressive disease in the spine as well. To summarize his treatment, he's received palliative treatment to bilateral proximal femurs, the chest including the left scapula, primary tumor, and T1 area of the thoracic spine, and to the iliac bone on the right.  PREVIOUS RADIATION THERAPY:   06/06/17-06/22/17: 30 Gy in 10 fractions to Chest including primary LUL tumor, left scapula, and  T1 Bilateral femurs Right iliac bone  06/27/17-07/12/17:  Lumbar spine 30 Gy in 10 fractions  PAST MEDICAL HISTORY:  Past Medical History:  Diagnosis Date  . Alcohol abuse   . Alcoholic hepatitis   . Allergy   . Anxiety   . 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 C. difficile colitis   . Depression   . DVT (deep venous thrombosis) ([place]) 2015  . GERD (gastroesophageal reflux disease)   . Hypertension   . Ileus ([place]) 09/2013  . 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 Shortness of breath    new onset- occasionally-at rest and exertion  . Substance abuse ([place])   . TIA (transient ischemic attack) 08/2013       PAST SURGICAL HISTORY: Past Surgical History:  Procedure Laterality Date  . HAND SURGERY Right   . HERNIA REPAIR    . INTRAMEDULLARY (IM) NAIL INTERTROCHANTERIC Right 08/17/2016   Procedure: INTRAMEDULLARY (IM) NAIL RIGHT HIP;  Surgeon: Tania Ade, MD;  Location: [place];  Service: Orthopedics;  Laterality: Right;  . LYMPHADENECTOMY Bilateral 08/29/2013   Procedure: LYMPHADENECTOMY "BILATERAL PELVIC LYMPH NODE DISSECTION";  Surgeon: Molli Hazard, MD;  Location: [facility];  Service: Urology;  Laterality: Bilateral;  . ORIF ANKLE FRACTURE Right 12/10/2012   Procedure: OPEN REDUCTION INTERNAL FIXATION (ORIF) ANKLE FRACTURE;  Surgeon: Hessie Dibble, MD;  Location: [facility];  Service: Orthopedics;  Laterality: Right;  . ROBOT ASSISTED LAPAROSCOPIC RADICAL PROSTATECTOMY N/A 08/29/2013   Procedure: ROBOTIC ASSISTED LAPAROSCOPIC RADICAL PROSTATECTOMY LEVEL 2,  Lysis of adhesions;  Surgeon: Molli Hazard, MD;  Location: [facility];  Service: Urology;  Laterality: N/A;     FAMILY HISTORY:  Family History  Problem Relation Age of Onset  . Lung cancer Father        smoker  . COPD Father   . Emphysema Father   . Arthritis Mother   . Pancreatic cancer Sister      SOCIAL HISTORY:  reports that he quit smoking about 5 months ago. His smoking use included cigarettes.  He has a 45.00 pack-year smoking history. he has never used smokeless tobacco. He reports that he drinks about 3.0 - 6.0 oz of alcohol per week. He reports that he uses drugs. Drug: Marijuana. The patient is divorced and lives in [place]. He is accompanied by his  daughter, and ex wife.   ALLERGIES: Dilaudid [hydromorphone hcl]; Procaine hcl; and Sulfa antibiotics   MEDICATIONS:  Current Facility-Administered Medications  Medication Dose Route Frequency Provider Last Rate Last Dose  . 0.9 %  sodium chloride infusion  250 mL Intravenous PRN Derrill Kay A, MD      . acetaminophen (TYLENOL) tablet 1,000 mg  1,000 mg Oral Q8H Elodia Florence., MD   1,000 mg at 08/28/17 0600  . albuterol (PROVENTIL) (2.5 MG/3ML) 0.083% nebulizer solution 2.5 mg  2.5 mg Nebulization Q2H PRN Derrill Kay A, MD   2.5 mg at 08/25/17 0600  . bisacodyl (DULCOLAX) suppository 10 mg  10 mg Rectal Daily PRN Pershing Proud, NP      . dexamethasone (DECADRON) tablet 8 mg  8 mg Oral P7T Vinie Sill C, NP   8 mg at 08/28/17 0600  . diazepam (VALIUM) tablet 5 mg  5 mg Oral BID PRN Phillips Grout, MD   5 mg at 08/27/17 1938  . diclofenac sodium (VOLTAREN) 1 % transdermal gel 2 g  2 g Topical QID Elodia Florence., MD   2 g at 08/27/17 2212  . docusate sodium (COLACE) capsule 100 mg  100 mg Oral BID Derrill Kay A, MD   100 mg at 08/25/17 2241  . dronabinol (MARINOL) capsule 5 mg  5 mg Oral BID AC Elodia Florence., MD      . enoxaparin (LOVENOX) injection 40 mg  40 mg Subcutaneous Daily Elodia Florence., MD   40 mg at 08/27/17 1011  . feeding supplement (ENSURE ENLIVE) (ENSURE ENLIVE) liquid 237 mL  237 mL Oral TID BM Elodia Florence., MD   237 mL at 08/27/17 1937  . fentaNYL (SUBLIMAZE) injection 100 mcg  100 mcg Intravenous Q2H PRN Gardiner Barefoot, NP   100 mcg at 08/28/17 0600  . Gerhardt's butt cream   Topical TID PRN Gardiner Barefoot, NP      . guaiFENesin (MUCINEX) 12 hr  tablet 600 mg  600 mg Oral BID Derrill Kay A, MD   600 mg at 08/27/17 2218  . ipratropium-albuterol (DUONEB) 0.5-2.5 (3) MG/3ML nebulizer solution 3 mL  3 mL Nebulization TID Elodia Florence., MD   3 mL at 08/27/17 2045  . lidocaine (LIDODERM) 5 % 1 patch  1 patch Transdermal Q24H Elodia Florence., MD   1 patch at 08/27/17 2228  . multivitamin with minerals tablet 1 tablet  1 tablet Oral Daily Elodia Florence., MD      . naloxone [facility]) injection 0.4 mg  0.4 mg Intravenous PRN Elodia Florence., MD  0.4 mg at 08/26/17 0251  . ondansetron (ZOFRAN) injection 4 mg  4 mg Intravenous Q6H PRN Lovey Newcomer T, NP   4 mg at 08/28/17 0740  . ondansetron (ZOFRAN) tablet 8 mg  8 mg Oral Q8H PRN Derrill Kay A, MD      . oxyCODONE (Oxy IR/ROXICODONE) immediate release tablet 15 mg  15 mg Oral Q6H PRN Elodia Florence., MD   15 mg at 08/28/17 0359  . pantoprazole (PROTONIX) EC tablet 40 mg  40 mg Oral Daily Derrill Kay A, MD   40 mg at 08/27/17 1011  . piperacillin-tazobactam (ZOSYN) IVPB 3.375 g  3.375 g Intravenous Q8H Thomes Lolling, RPH 12.5 mL/hr at 08/28/17 0740 3.375 g at 08/28/17 0740  . polyethylene glycol (MIRALAX / GLYCOLAX) packet 17 g  17 g Oral Daily Derrill Kay A, MD   17 g at 08/25/17 1008  . pregabalin (LYRICA) capsule 150 mg  150 mg Oral BID Pershing Proud, NP   062 mg at 08/27/17 2210  . PRUTECT emulsion 1 application  1 application Topical BID Phillips Grout, MD   1 application at 69/48/54 0913  . senna (SENOKOT) tablet 17.2 mg  2 tablet Oral BID Micheline Rough, MD   17.2 mg at 08/25/17 2236  . simvastatin (ZOCOR) tablet 20 mg  20 mg Oral q1800 Caren Griffins, MD   20 mg at 08/27/17 1740  . sodium chloride flush (NS) 0.9 % injection 3 mL  3 mL Intravenous Q12H Derrill Kay A, MD   3 mL at 08/27/17 2235  . sodium chloride flush (NS) 0.9 % injection 3 mL  3 mL Intravenous PRN Derrill Kay A, MD      . thiamine (VITAMIN B-1) tablet 100 mg  100 mg  Oral Daily Derrill Kay A, MD   100 mg at 08/24/17 0912  . traZODone (DESYREL) tablet 50 mg  50 mg Oral QHS PRN Phillips Grout, MD   50 mg at 08/26/17 2136  . vancomycin (VANCOCIN) IVPB 1000 mg/200 mL premix  1,000 mg Intravenous Q12H Thomes Lolling, [facility]   Stopped at 08/28/17 6270     REVIEW OF SYSTEMS: On review of systems, the patient reports that he is not doing well due to progressive back pain. He reports he is having severe pain in the mid and low back. He reports left hip pain and denies any chest wall or rib pain. He denies any sternal chest pain, shortness of breath, cough, fevers, chills, night sweats.   PHYSICAL EXAM:  Wt Readings from Last 3 Encounters:  08/27/17 158 lb 4.6 oz (71.8 kg)  07/31/17 153 lb 8 oz (69.6 kg)  07/16/17 160 lb 3.2 oz (72.7 kg)   Temp Readings from Last 3 Encounters:  08/28/17 98.1 F (36.7 C) (Oral)  08/14/17 98.5 F (36.9 C) (Oral)  08/10/17 98.7 F (37.1 C)   BP Readings from Last 3 Encounters:  08/28/17 131/88  08/14/17 (!) 95/58  08/10/17 (!) 80/58   Pulse Readings from Last 3 Encounters:  08/28/17 87  08/14/17 81  08/10/17 92   Pain Assessment Pain Score: 7  In general this is a well appearing caucasian male in no acute distress. He is alert and oriented x4 and appropriate throughout the examination. HEENT reveals that the patient is normocephalic, atraumatic. EOMs are intact. PERRLA. Skin is intact without any evidence of gross lesions. Cardiovascular exam reveals a regular rate and rhythm, no clicks rubs or murmurs are auscultated.  Chest is clear to auscultation bilaterally. Lymphatic assessment is performed and does not reveal any adenopathy in the cervical, supraclavicular, axillary, or inguinal chains. Abdomen has active bowel sounds in all quadrants and is intact. The abdomen is soft, non tender, non distended. Lower extremities are negative for pretibial pitting edema, deep calf tenderness, cyanosis or clubbing. He has 5/5  strength in bilateral upper and lower extremities and intact sensation to light touch equally. Grip strength is equal bilaterally and he does not have any weakness or decreased sensation in the chest wall to light touch.  ECOG = 4  0 - Asymptomatic (Fully active, able to carry on all predisease activities without restriction)  1 - Symptomatic but completely ambulatory (Restricted in physically strenuous activity but ambulatory and able to carry out work of a light or sedentary nature. For example, light housework, office work)  2 - Symptomatic, <50% in bed during the day (Ambulatory and capable of all self care but unable to carry out any work activities. Up and about more than 50% of waking hours)  3 - Symptomatic, >50% in bed, but not bedbound (Capable of only limited self-care, confined to bed or chair 50% or more of waking hours)  4 - Bedbound (Completely disabled. Cannot carry on any self-care. Totally confined to bed or chair)  5 - Death   Eustace Pen MM, Creech RH, Tormey DC, et al. [phone]). "Toxicity and response criteria of the [facility] : [facility]". [place] Oncol. 5 (6): 649-55    LABORATORY DATA:  Lab Results  Component Value Date   WBC 12.9 (H) 08/28/2017   HGB 9.1 (L) 08/28/2017   HCT 27.6 (L) 08/28/2017   MCV 96.8 08/28/2017   PLT 178 08/28/2017   Lab Results  Component Value Date   NA 132 (L) 08/28/2017   K 4.3 08/28/2017   CL 94 (L) 08/28/2017   CO2 27 08/28/2017   Lab Results  Component Value Date   ALT 24 08/28/2017   AST 26 08/28/2017   ALKPHOS 67 08/28/2017   BILITOT 0.5 08/28/2017      RADIOGRAPHY: Dg Chest 2 View  Result Date: 08/20/2017 CLINICAL DATA:  Dyspnea EXAM: CHEST  2 VIEW COMPARISON:  05/02/2017 chest radiograph. FINDINGS: Stable cardiomediastinal silhouette with normal heart size. No pneumothorax. No pleural effusion. New patchy left lower lobe lung opacity. No pulmonary edema. Stable chronic mid thoracic vertebral compression  fracture with associated kyphotic angulation. IMPRESSION: New patchy left lower lobe lung opacity, which may represent a pneumonia in the correct clinical setting. Recommend attention on short-term follow-up post treatment chest imaging. Electronically Signed   By: Ilona Sorrel M.D.   On: 08/20/2017 19:42   Dg Shoulder Right  Result Date: 08/22/2017 CLINICAL DATA:  Chronic shoulder pain EXAM: RIGHT SHOULDER - 2+ VIEW COMPARISON:  None. FINDINGS: There is no evidence of fracture or dislocation. There is generalized osteopenia. There is no evidence of arthropathy or other focal bone abnormality. Soft tissues are unremarkable. IMPRESSION: No acute osseous injury of the right shoulder. Electronically Signed   By: Kathreen Devoid   On: 08/22/2017 10:11   Ct Angio Chest Pe W Or Wo Contrast  Result Date: 08/26/2017 CLINICAL DATA:  Dyspnea. Chronic. Stage IV cancer of unknown primary. EXAM: CT ANGIOGRAPHY CHEST WITH CONTRAST TECHNIQUE: Multidetector CT imaging of the chest was performed using the standard protocol during bolus administration of intravenous contrast. Multiplanar CT image reconstructions and MIPs were obtained to evaluate the vascular anatomy. CONTRAST:  153m ISOVUE-370  IOPAMIDOL (ISOVUE-370) INJECTION 76% COMPARISON:  CT chest 08/20/2017 and CT abdomen and pelvis from 05/04/2017 and PET-CT from 05/11/2017 FINDINGS: Cardiovascular: The heart size appears normal. No pericardial effusion. Aortic atherosclerosis. Calcification in the left main coronary artery and LAD identified. The main pulmonary artery is patent. No lobar or central obstructing pulmonary emboli identified. Respiratory motion artifact diminishes exam detail within the lower lobe pulmonary arteries. No lobar or segmental pulmonary artery filling defects identified to suggest a clinically significant acute pulmonary embolus. Mediastinum/Nodes: The thyroid gland appears normal. The trachea is patent and midline. Normal appearance of the  esophagus. The enlarged prevascular lymph node measures 2.7 x 1.7 cm, image 92 of series 3. Unchanged from previous exam. No additional mediastinal or hilar adenopathy. No axillary or supraclavicular adenopathy. Lungs/Pleura: Small left pleural effusion is similar to previous exam. Significant worsening aeration to the right lower lobe secondary to airspace consolidation and atelectasis. Left upper lobe paramediastinal subpleural mass measures 3.2 by 2.2 cm, image 34 of series 2. Previously this measured the same. Scattered ground-glass and tree-in-bud nodularity identified within both lungs. Upper Abdomen: Progressive liver metastasis identified compared with CT from 05/04/2017. Musculoskeletal: Extensive multifocal lytic bone metastases are identified. Large left posterolateral eighth rib expansile lesion measures 7.4 by 3.4 cm, image 182 of series 3. Previously 6.3 by 3.2 cm. Expansile right lateral eighth rib lesion measures 5.6 by 2.4 cm, image 212 of series 3. Previously 3.4 x 2.5 cm. Numerous lytic bone lesions are identified throughout the thoracic spine. Using the same numbering scheme as the study from 08/20/2017 pathologic fractures involving T7, T8 and T9 vertebra are again identified. Review of the MIP images confirms the above findings. IMPRESSION: 1. No evidence for acute pulmonary embolus. 2. Interval progression of right lower lobe airspace consolidation in atelectasis. This may in part be contributing to the patient's difficulty with breathing. 3. Bilateral expansile rib lesions have progressed from previous exam. 4. Extensive thoracic spine lytic bone metastasis with multi level pathologic fractures. 5. Similar appearance of enlarged prevascular mediastinal lymph node and left upper lobe subpleural mass. Electronically Signed   By: Kerby Moors M.D.   On: 08/26/2017 17:54   Ct Angio Chest Pe W And/or Wo Contrast  Result Date: 08/20/2017 CLINICAL DATA:  Dyspnea and diffuse pain. Patient is on  chemotherapy for bone and lung cancer. EXAM: CT ANGIOGRAPHY CHEST WITH CONTRAST TECHNIQUE: Multidetector CT imaging of the chest was performed using the standard protocol during bolus administration of intravenous contrast. Multiplanar CT image reconstructions and MIPs were obtained to evaluate the vascular anatomy. CONTRAST:  38m ISOVUE-370 IOPAMIDOL (ISOVUE-370) INJECTION 76% COMPARISON:  PET-CT 05/11/2017, chest CT 05/03/2017. FINDINGS: Cardiovascular: Heart is normal in size. No pericardial effusion is identified. There is coronary arteriosclerosis along the left main and LAD. No acute pulmonary embolus. Satisfactory opacification of the pulmonary arteries. There is minimal aortic atherosclerosis. No aneurysm or dissection. Mediastinum/Nodes: Interval increase in prevascular lymph node measuring 2.8 x 1.7 cm, series 4, image 27 versus 1.1 x 0.5 cm on prior chest CT. Trachea and mainstem bronchi appear patent. The thoracic esophagus is unremarkable. No thyromegaly or mass. No supraclavicular, axillary nor hilar lymphadenopathy. Lungs/Pleura: Interval increase in size of previously noted medial left upper lobe mass abutting the superior mediastinum, currently estimated at 3.2 x 2.2 x 2.1 cm previously estimated at 2.2 x 2.1 x 1.4 cm. No additional pulmonary masses. There is atelectasis along the posterior aspect of both lower lobes. Upper Abdomen: Inhomogeneous appearance of the included right  hepatic lobe concerning for metastasis. Included left adrenal gland is unremarkable. The right adrenal gland is excluded. Musculoskeletal: The following osteolytic metastatic foci and/or fractures are noted: 1. Left glenoid neck. 2. Right lateral third and sixth rib fractures likely pathologic. Left lateral sixth rib fracture. 3. Bony expansile 3.7 x 2.8 cm right lateral fifth rib mass, bony expansile lytic 3.3 x 2.5 cm right posterolateral seventh rib and 1.5 x 2.4 cm right lateral eighth rib lytic masses. 4. 6.8 x 3.4 cm  bony expansile lytic mass of the posterior left eighth rib likely accounting for the opacity seen radiographically, expansile lytic 3.8 x 2 cm left posterolateral ninth rib mass. 5. Redemonstration of lower thoracic kyphosis with multilevel lytic lesions of the thoracic spine namely T2 through T5, T7, T9 and T10. Review of the MIP images confirms the above findings. IMPRESSION: 1. The left lower lobe opacity seen on same day chest radiograph is not attributable to a pneumonia but due to a bony expansile lytic lesion involving the posterior left eighth rib measuring 6.8 x 3.4 cm. Additional bony expansile lytic metastasis involving numerous bilateral ribs as above described with osteolytic lesions of the thoracic spine are in keeping with metastasis. Additional osteolytic abnormalities involving the left glenoid neck with pathologic bilateral rib fractures. 2. Ill-defined partially included hypodensities in the right hepatic lobe are concerning for hepatic metastasis, also new since prior. 3. Interval increase in left upper lobe paramediastinal mass now measuring 3.2 x 2.2 x 2.1 cm versus 2.2 x 2.1 x 1.4 cm previously. Interval increase size of a prevascular lymph node the abutting the aortic arch now measuring 2.8 x 1.7 cm versus 1.1 x 0.5 cm on prior chest CT. 4. No acute pulmonary embolus. 5. Coronary arteriosclerosis. Electronically Signed   By: Ashley Royalty M.D.   On: 08/20/2017 22:07   Mr Lumbar Spine Wo Contrast  Result Date: 08/27/2017 CLINICAL DATA:  62 year old male with recently diagnosed stage IV portion poorly differentiated spindle cell carcinoma. Severe pain. EXAM: MRI LUMBAR SPINE WITHOUT CONTRAST TECHNIQUE: Multiplanar, multisequence MR imaging of the lumbar spine was performed. No intravenous contrast was administered. COMPARISON:  CT Abdomen and Pelvis 08/26/2017.  PET-CT 05/11/2017 FINDINGS: The examination had to be discontinued prior to completion due to severe pain. Only sagittal T1 weighted  imaging was obtained. Normal lumbar segmentation demonstrated on the recent CT Abdomen and Pelvis. Malignant appearing decreased T1 bone marrow signal is scattered throughout the visible spinal vertebrae from the T12 to the S2 level. These correspond to predominately lytic osseous lesions on the recent CT. Stable vertebral height and alignment to the CT yesterday. There is early lumbar pedicle involvement on the left at L3 and on the right at L4. There is left lamina involvement at L1. There is no malignant spinal or neural foraminal stenosis identified. Stable visible abdominal viscera. IMPRESSION: 1. The examination had to be discontinued prior to completion due to severe pain. Only sagittal T1 weighted imaging was obtained. 2. Stable lower thoracic and lumbar osseous metastatic disease compared to the CT yesterday. No malignant lumbar spinal or neural impingement identified. Electronically Signed   By: Genevie Ann M.D.   On: 08/27/2017 11:51   Ct Abdomen Pelvis W Contrast  Result Date: 08/26/2017 CLINICAL DATA:  Stage IV cancer of unknown primary.  Abdominal pain. EXAM: CT ABDOMEN AND PELVIS WITH CONTRAST TECHNIQUE: Multidetector CT imaging of the abdomen and pelvis was performed using the standard protocol following bolus administration of intravenous contrast. CONTRAST:  168m ISOVUE-370  IOPAMIDOL (ISOVUE-370) INJECTION 76% COMPARISON:  05/04/17. FINDINGS: Lower chest: Dense airspace consolidation within the posterior and medial right lung base identified. Small left pleural effusion identified. Hepatobiliary: Progressive multifocal liver metastases identified. Index lesion within the posterior right lobe of liver measures 3.2 cm, image 19 of series 4. New from previous exam. Inferior right lobe of liver metastasis is also new measuring 2.4 cm, image 35 of series 4. Anterior right lobe of liver metastasis measures 2.0 cm, image 29 of series 4. New from previous exam. Gallbladder appears normal. No biliary  dilatation. Pancreas: Normal appearance of the pancreas. Spleen: The spleen is unremarkable. Adrenals/Urinary Tract: Normal appearance of both the adrenal glands. Bilateral kidney cysts are again noted. No mass or hydronephrosis visualized. Urinary bladder appears normal. Stomach/Bowel: The stomach is normal. The small bowel loops have a normal course and caliber without evidence for bowel obstruction. No pathologic dilatation of the colon. Vascular/Lymphatic: Aortic atherosclerosis. No aneurysm. No abdominal adenopathy. No pelvic or inguinal adenopathy. Reproductive: Previous prostatectomy. Other: Small supraumbilical ventral abdominal wall hernia contains fat only. Trace free fluid noted within the pelvis. Musculoskeletal: Multifocal lytic bone metastases are identified. Progressed from previous exam. Bilateral expansile rib lesions are identified, new from previous exam. Left posterolateral ninth rib lesion measures 3.6 x 2.0 cm, image 15 of series 4. Left posterior eighth rib expansile lesion measures 7 x 4.1 cm. Extensive lytic bone metastases are noted throughout the visualized portions of the thoracic and lumbar spine. Increased from prior exam. L2 lytic lesion with pathologic compression fracture measures 1.7 cm, image 68 of series 8. New from previous exam. Large lytic lesion within the T8 vertebra with associated superior endplate pathologic fracture is new measuring 1.9 cm, image number 68 of series 8. S1 lytic lesion measures 2.2 cm, image 69 of series 8. The previously 0.8 cm. Enlarging lytic lesion involving the posterior cortex of the proximal left femur may be of orthopedic significance measuring 2.5 cm, image 90 of series 4. Previously 0.8 cm. Also of potential orthopedic significance are bilateral inferior pubic rami lytic lesions. IMPRESSION: 1. Interval development of multifocal liver metastasis. 2. Significant interval progression of multifocal lytic bone metastases involving the spine, bony  pelvis and left femur. Associated pathologic fractures are noted within the lumbar spine. Lesions involving the left femur and pelvis may be of orthopedic significance. Electronically Signed   By: Kerby Moors M.D.   On: 08/26/2017 17:35   Dg Chest Port 1 View  Result Date: 08/25/2017 CLINICAL DATA:  Right-sided chest pain with deep breathing. Shortness of breath and cough, recently diagnosed with stage IV cancer. History of hypertension. EXAM: PORTABLE CHEST 1 VIEW COMPARISON:  Chest x-ray from earlier same day and chest x-ray dated 08/20/2017. Chest CT dated 08/20/2017. FINDINGS: Cardiomediastinal silhouette is stable in size and configuration. Again noted are the known chest wall metastases bilaterally. No new lung findings to suggest a developing pneumonia. No pleural effusion or pneumothorax seen. IMPRESSION: Stable chest x-ray. No evidence of pneumonia or pulmonary edema. Known chest wall metastases again noted bilaterally. Electronically Signed   By: Franki Cabot M.D.   On: 08/25/2017 18:05   Dg Chest Port 1 View  Result Date: 08/25/2017 CLINICAL DATA:  Shortness of breath and hypoxia. Metastatic bone disease. EXAM: PORTABLE CHEST 1 VIEW COMPARISON:  08/20/2017 and CT 08/20/2017 FINDINGS: Lungs are adequately inflated without focal airspace consolidation or effusion. Evidence of patient's known metastatic lesions over the right lateral thorax and posterolateral right chest wall. Cardiomediastinal silhouette and  remainder of the exam is unchanged. IMPRESSION: No acute cardiopulmonary disease. Known chest wall metastatic disease. Electronically Signed   By: Marin Olp M.D.   On: 08/25/2017 13:52   Dg Hip Unilat With Pelvis 2-3 Views Left  Result Date: 08/27/2017 CLINICAL DATA:  Left hip pain without known injury. EXAM: DG HIP (WITH OR WITHOUT PELVIS) 2-3V LEFT COMPARISON:  None. FINDINGS: There is no evidence of hip fracture or dislocation. There is no evidence of arthropathy or other focal  bone abnormality. IMPRESSION: Normal left hip. Electronically Signed   By: Marijo Conception, M.D.   On: 08/27/2017 12:10       IMPRESSION/PLAN: 1. 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 I spoke with the patient and examined him. He appears to be neurologically intact and has been receiving dexamthasone. He does not have radiographic evidence currently of epidural involvement of his spinal disease, but I will ask Dr. Lisbeth Renshaw to evaluate his films on Tuesday 2/19 when he returns to the office. The patient recently completed radiotherapy to bilateral femurs including the pelvis, Lower lumbar spine, and chest.     Carola Rhine, PAC

## 2017-08-28 NOTE — Progress Notes (Signed)
After reviewing films today with Dr. Lisbeth Renshaw, the patient has had complete treatment to the left femur, and lumbar spine. His upper thoracic spine has also received treatment. He does have bulky disease in the thoracic spine, and Dr. Lisbeth Renshaw would offer treatment with radiotherapy to T7-9, and would consider the ribs nearby. The patient is offered this approach, but is unsure he'd be able to withstand lying flat on our treatment table due to his pain. He would like to focus on maximizing his pain control, and we will check in the morning if he'd like to simulate and proceed with treatment on Thursday. He may elect to forgo this as well. We will check in tomorrow with him.      Carola Rhine, PAC

## 2017-08-29 ENCOUNTER — Telehealth: Payer: Self-pay | Admitting: Oncology

## 2017-08-29 ENCOUNTER — Ambulatory Visit: Admit: 2017-08-29 | Payer: Medicare HMO | Admitting: Radiation Oncology

## 2017-08-29 DIAGNOSIS — C7951 Secondary malignant neoplasm of bone: Principal | ICD-10-CM

## 2017-08-29 DIAGNOSIS — J449 Chronic obstructive pulmonary disease, unspecified: Secondary | ICD-10-CM

## 2017-08-29 DIAGNOSIS — J9601 Acute respiratory failure with hypoxia: Secondary | ICD-10-CM

## 2017-08-29 DIAGNOSIS — G893 Neoplasm related pain (acute) (chronic): Secondary | ICD-10-CM

## 2017-08-29 LAB — CREATININE, SERUM
CREATININE: 0.66 mg/dL (ref 0.61–1.24)
GFR calc Af Amer: >60 mL/min (ref >60)
GFR calc non Af Amer: >60 mL/min (ref >60)

## 2017-08-29 MED ORDER — OXYCODONE HCL 5 MG PO TABS
15.0000 mg | ORAL_TABLET | ORAL | Status: DC | PRN
  Administered 2017-08-29 – 2017-08-30 (×5): 15 mg via ORAL
  Filled 2017-08-29 (×5): qty 3

## 2017-08-29 MED ORDER — DIAZEPAM 5 MG PO TABS
5.0000 mg | ORAL_TABLET | Freq: Three times a day (TID) | ORAL | Status: DC
  Administered 2017-08-29 – 2017-08-30 (×4): 5 mg via ORAL
  Filled 2017-08-29 (×4): qty 1

## 2017-08-29 MED ORDER — NYSTATIN 100000 UNIT/ML MT SUSP
5.0000 mL | Freq: Four times a day (QID) | OROMUCOSAL | Status: DC
  Administered 2017-08-29 – 2017-09-04 (×23): 500000 [IU] via OROMUCOSAL
  Filled 2017-08-29 (×23): qty 5

## 2017-08-29 MED ORDER — MORPHINE SULFATE (PF) 2 MG/ML IV SOLN
2.0000 mg | INTRAVENOUS | Status: DC | PRN
  Administered 2017-08-29: 2 mg via INTRAVENOUS
  Filled 2017-08-29: qty 1

## 2017-08-29 MED ORDER — MORPHINE SULFATE (PF) 2 MG/ML IV SOLN
2.0000 mg | INTRAVENOUS | Status: DC | PRN
  Administered 2017-08-29 – 2017-08-30 (×4): 2 mg via INTRAVENOUS
  Filled 2017-08-29 (×4): qty 1

## 2017-08-29 MED ORDER — FENTANYL CITRATE (PF) 100 MCG/2ML IJ SOLN
100.0000 ug | INTRAMUSCULAR | Status: DC | PRN
  Administered 2017-08-29: 100 ug via INTRAVENOUS
  Filled 2017-08-29: qty 2

## 2017-08-29 NOTE — Progress Notes (Signed)
OT Cancellation Note  Patient Details Name: Tanner [name] MRN: [id number] DOB: Dec 07, 1955   Cancelled Treatment:    Reason Eval/Treat Not Completed: Other (comment).  Pt cannot tolerate OT at this time due to pain. Will sign off.  Tamika Nou 08/29/2017, 2:45 PM  Lesle Chris, OTR/L [phone] 08/29/2017

## 2017-08-29 NOTE — Progress Notes (Signed)
   08/29/17 1200  Clinical Encounter Type  Visited With Patient and family together  Visit Type Initial;Psychological support;Spiritual support  Referral From Nurse  Consult/Referral To Chaplain  Spiritual Encounters  Spiritual Needs Emotional;Other (Comment) (Spiritual Care Conversation/Support)  Stress Factors  Patient Stress Factors Health changes;Major life changes  Family Stress Factors Health changes;Major life changes   I visited the patient who had good family support. The patient stated that he has a pastor that has been coming to visit him.  No needs were present at this time.   Please, contact Spiritual Care for further assistance.   Chaplain Shanon Ace M.Div., [facility]

## 2017-08-29 NOTE — Care Management Important Message (Signed)
Important Message  Patient Details  Name: Tanner [name] MRN: [id number] Date of Birth: [birth year]   Medicare Important Message Given:  Yes    Kerin Salen 08/29/2017, 12:59 [place] Message  Patient Details  Name: Tanner [name] MRN: [id number] Date of Birth: [birth year]   Medicare Important Message Given:  Yes    Kerin Salen 08/29/2017, 12:59 PM

## 2017-08-29 NOTE — Progress Notes (Signed)
Received call in spiritual care office from patient's friend.  She requests chaplain visits.  Note Chaplain [place] visited with pt today.  We will continue to follow this patient for assessment and support as needed.    [facility] / [place], MDiv

## 2017-08-29 NOTE — Telephone Encounter (Signed)
Adline Mango, RN on [address] and verfied that patient is OK to have CT Simulation at 10 am today.

## 2017-08-29 NOTE — Progress Notes (Signed)
[facility] PROGRESS NOTE  Tanner [name] SPQ:[id number] DOB: 08-15-1955 DOA: 08/20/2017  PCP: [name], NP  Brief History/Interval Summary: 62 year old unfortunate male with past medical history of recently diagnosed stage IV cancer of unknown primary presented with shortness of breath.  Also presented with severe pain due to multiple bony lytic lesions.  Hospitalized for pain control.  Reason for Visit: Stage IV cancer  Consultants: Palliative medicine.  Oncology  Procedures: None  Antibiotics: None  Subjective/Interval History: Patient continues to have 9 out of 10 pain in his back and sides.  Also complains of pain over his ribs.  His daughter is at the bedside.  ROS: Denies any nausea or vomiting  Objective:  Vital Signs  Vitals:   08/28/17 2016 08/29/17 0352 08/29/17 0836 08/29/17 1111  BP: (!) 149/95 138/83  118/88  Pulse: 87 94  (!) 101  Resp: 17 18    Temp: 98.4 F (36.9 C) 98.8 F (37.1 C)  98 F (36.7 C)  TempSrc: Oral Oral  Oral  SpO2: 96% 97% 95% 97%  Weight:      Height:        Intake/Output Summary (Last 24 hours) at 08/29/2017 1348 Last data filed at 08/29/2017 1040 Gross per 24 hour  Intake 420 ml  Output 1650 ml  Net -1230 ml   Filed Weights   08/24/17 1556 08/25/17 0406 08/27/17 1831  Weight: 67.2 kg (148 lb 2.4 oz) 68.4 kg (150 lb 12.7 oz) 71.8 kg (158 lb 4.6 oz)    General appearance: Appears to be in discomfort due to pain. Head: Normocephalic, without obvious abnormality, atraumatic Resp: clear to auscultation bilaterally Cardio: regular rate and rhythm, S1, S2 normal, no murmur, click, rub or gallop GI: Diffusely tender without any rebound rigidity or guarding. Extremities: extremities normal, atraumatic, no cyanosis or edema Neurologic: No obvious focal neurological deficits.   Lab Results:  Data Reviewed: I have personally reviewed following labs and imaging studies  CBC: Recent Labs  Lab 08/25/17 0352  08/25/17 1802 08/26/17 0337 08/27/17 0312 08/27/17 1443 08/28/17 0337  WBC 8.4 11.1* 9.9 9.7  --  12.9*  HGB 9.3* 10.0* 8.7* 8.2* 8.5* 9.1*  HCT 28.8* 30.3* 26.9* 23.9* 25.2* 27.6*  MCV 98.3 98.7 98.9 97.6  --  96.8  PLT 261 236 209 184  --  333    Basic Metabolic Panel: Recent Labs  Lab 08/25/17 0352 08/25/17 1802 08/26/17 0337 08/27/17 0312 08/28/17 0337 08/29/17 0406  NA 136 133* 134* 136 132*  --   K 4.3 4.7 4.2 4.1 4.3  --   CL 93* 92* 99* 100* 94*  --   CO2 33* 31 27 27 27   --   GLUCOSE 128* 127* 174* 154* 135*  --   BUN 26* 29* 27* 23* 21*  --   CREATININE 0.64 0.76 0.70 0.65 0.63 0.66  CALCIUM 9.4 9.2 8.4* 8.6* 8.5*  --   MG 2.0  --   --  1.8 1.8  --     GFR: Estimated Creatinine Clearance: 101 mL/min (by C-G formula based on SCr of 0.66 mg/dL).  Liver Function Tests: Recent Labs  Lab 08/23/17 0439 08/28/17 0337  AST 17 26  ALT 11* 24  ALKPHOS 87 67  BILITOT 0.2* 0.5  PROT 6.7 5.6*  ALBUMIN 2.4* 2.1*    Coagulation Profile: Recent Labs  Lab 08/25/17 2250  INR 1.12    Cardiac Enzymes: Recent Labs  Lab 08/28/17 0931  TROPONINI <0.03  Recent Results (from the past 240 hour(s))  Urine culture     Status: None   Collection Time: 08/25/17  7:44 PM  Result Value Ref Range Status   Specimen Description   Final    URINE, CLEAN CATCH Performed at [facility], [place] [address], [place], [place] [address]    Special Requests   Final    NONE Performed at [facility], [place] [address], [place], [place] [address]    Culture   Final    NO GROWTH Performed at [facility], [place] [address] [place], [place] [address]    Report Status 08/27/2017 FINAL  Final  Culture, blood (x 2)     Status: None (Preliminary result)   Collection Time: 08/25/17  8:18 PM  Result Value Ref Range Status   Specimen Description BLOOD RIGHT ANTECUBITAL  Final   Special Requests IN PEDIATRIC BOTTLE Blood Culture adequate  volume  Final   Culture   Final    NO GROWTH 3 DAYS Performed at [facility], [place] [address] [place], [place] [address]    Report Status PENDING  Incomplete  Culture, blood (x 2)     Status: None (Preliminary result)   Collection Time: 08/25/17  8:18 PM  Result Value Ref Range Status   Specimen Description   Final    BLOOD RIGHT HAND Performed at [place] [address] [place], [place] [address]    Special Requests   Final    BOTTLES DRAWN AEROBIC ONLY Blood Culture adequate volume   Culture   Final    NO GROWTH 3 DAYS Performed at [facility], [place] [address] [place], [place] [address]    Report Status PENDING  Incomplete  MRSA PCR Screening     Status: None   Collection Time: 08/26/17 10:19 PM  Result Value Ref Range Status   MRSA by PCR NEGATIVE NEGATIVE Final    Comment:        The GeneXpert MRSA Assay (FDA approved for NASAL specimens only), is one component of a comprehensive MRSA colonization surveillance program. It is not intended to diagnose MRSA infection nor to guide or monitor treatment for MRSA infections. Performed at [facility], [place] [address] [place], [place] [address]       Radiology Studies: No results found.   Medications:  Scheduled: . acetaminophen  1,000 mg Oral Q8H  . dexamethasone  8 mg Oral Q8H  . diazepam  5 mg Oral TID  . diclofenac sodium  2 g Topical QID  . dronabinol  5 mg Oral TID AC  . enoxaparin (LOVENOX) injection  40 mg Subcutaneous Daily  . feeding supplement (ENSURE ENLIVE)  237 mL Oral TID BM  . fentaNYL  25 mcg Transdermal Q72H  . guaiFENesin  600 mg Oral BID  . ipratropium-albuterol  3 mL Nebulization BID  . lidocaine  1 patch Transdermal Q24H  . multivitamin with minerals  1 tablet Oral Daily  . nystatin  5 mL Mouth/Throat QID  . pantoprazole  40 mg Oral Daily  . polyethylene glycol  17 g Oral Daily  . pregabalin  150 mg Oral BID  . PRUTECT  1  application Topical BID  . senna-docusate  2 tablet Oral QHS  . simvastatin  20 mg Oral q1800  . sodium chloride flush  3 mL Intravenous Q12H  . thiamine  100 mg Oral Daily   Continuous: . sodium chloride    .  piperacillin-tazobactam (ZOSYN)  IV 3.375 g (08/29/17 0618)   XTG:GYIRSW chloride, albuterol, bisacodyl, calcium carbonate, Gerhardt's butt cream, morphine injection, naLOXone (NARCAN)  injection, ondansetron (ZOFRAN) IV, ondansetron, oxyCODONE, sodium chloride flush, traZODone  Assessment/Plan:  Principal Problem:   Acute respiratory failure with hypoxia (HCC) Active Problems:   Essential hypertension, benign   Generalized anxiety disorder   Bone metastasis (HCC)   Malignant tumor, spindle cell type (HCC)   Hypoxia   COPD (chronic obstructive pulmonary disease) ([place])   Cancer associated pain   Palliative care encounter   Malnutrition of moderate degree    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  Patient is status post palliative radiotherapy and has been on systemic chemotherapy.  CT scan from 2/19 shows worsening of metastases.  Seen by oncologist who states that his prognosis is very poor.  Palliative medicine was consulted.  Patient is DNR.  Plan is for mainly comfort care and pain control.  Started on long-acting and short-acting pain regimen.  Bowel regimen.  Lidocaine patch for localized pain.  Patient also seen by orthopedics due to lesions in the left femur and pelvis.  No clear indication for surgical intervention at this time.  Patient also on dexamethasone orally.  Acute respiratory failure with hypoxia with concern for pneumonia On 2/16 patient had to be transferred to ICU for somnolence and increased O2 requirement.  This was thought to be secondary to narcotics.  He was given Narcan with improvement.  Then transferred back to floor.  CT scan done on 2/17 notable for progression in right lower lobe airspace  consolidation with atelectasis.  Continue incentive spirometry.  No PE was noted.  Noted to be on Zosyn.  Stop after 7 days.  Acute encephalopathy Most likely due to narcotics.  Now resolved.  Continue to monitor closely.  Urinary retention MRI he did not suggest any cauda equina.  Urine retention could be due to patient's immobility.  Continue Foley catheter for now.  History of COPD Continue duo nebs as needed.  History of essential hypertension Recently patient has been hypotensive.  Holding blood pressure medications.  Hyperlipidemia Continue statins.  History of anemia Hemoglobin is stable.  No evidence for overt bleeding.  Severe protein calorie malnutrition Continue nutritional supplements.  DVT Prophylaxis: Lovenox    Code Status: DNR Family Communication: Discussed with the patient and his daughter Disposition Plan: Management as outlined above.    LOS: 8 days   [facility] Pager [phone] 08/29/2017, 1:48 PM  If 7PM-7AM, please contact night-coverage at [email or web address], password [facility]

## 2017-08-29 NOTE — Progress Notes (Signed)
Nutrition Brief Note  Chart reviewed. Pt now transitioning to comfort care.  No further nutrition interventions warranted at this time.   [name], MS, RD, LDN [facility] Clinical Dietitian Pager: [phone] After Hours Pager: [phone]   

## 2017-08-29 NOTE — Progress Notes (Addendum)
Daily Progress Note   Patient Name: Tanner [name]       Date: 08/29/2017 DOB: 1955-08-26  Age: 62 y.o. MRN#: [id number] Attending Physician: Bonnielee Haff, MD Primary Care Physician: [name], NP Admit Date: 08/20/2017  Reason for Consultation/Follow-up: Pain control, GOC  Subjective: Tanner [name] is tearful and in severe pain this morning. He says he had a horrible night and was in pain and did not sleep.     Length of Stay: 8  Current Medications: Scheduled Meds:  . acetaminophen  1,000 mg Oral Q8H  . dexamethasone  8 mg Oral Q8H  . diazepam  5 mg Oral TID  . diclofenac sodium  2 g Topical QID  . dronabinol  5 mg Oral TID AC  . enoxaparin (LOVENOX) injection  40 mg Subcutaneous Daily  . feeding supplement (ENSURE ENLIVE)  237 mL Oral TID BM  . fentaNYL  25 mcg Transdermal Q72H  . guaiFENesin  600 mg Oral BID  . ipratropium-albuterol  3 mL Nebulization BID  . lidocaine  1 patch Transdermal Q24H  . multivitamin with minerals  1 tablet Oral Daily  . pantoprazole  40 mg Oral Daily  . polyethylene glycol  17 g Oral Daily  . pregabalin  150 mg Oral BID  . PRUTECT  1 application Topical BID  . senna-docusate  2 tablet Oral QHS  . simvastatin  20 mg Oral q1800  . sodium chloride flush  3 mL Intravenous Q12H  . thiamine  100 mg Oral Daily    Continuous Infusions: . sodium chloride    . piperacillin-tazobactam (ZOSYN)  IV 3.375 g (08/29/17 0618)    PRN Meds: sodium chloride, albuterol, bisacodyl, calcium carbonate, fentaNYL (SUBLIMAZE) injection, Gerhardt's butt cream, naLOXone (NARCAN)  injection, ondansetron (ZOFRAN) IV, ondansetron, oxyCODONE, sodium chloride flush, traZODone  Physical Exam  Constitutional: He is oriented to person, place, and time. He appears  well-developed. He appears ill.  HENT:  Head: Normocephalic and atraumatic.  Cardiovascular: Normal rate and regular rhythm.  Pulmonary/Chest: No accessory muscle usage. No tachypnea. No respiratory distress.  Oxygen needs decreasing  Abdominal: Normal appearance.  Neurological: He is alert and oriented to person, place, and time.  Nursing note and vitals reviewed.           Vital Signs: BP 138/83 (BP Location: Right Arm)  Pulse 94   Temp 98.8 F (37.1 C) (Oral)   Resp 18   Ht 5' 10"  (1.778 m)   Wt 71.8 kg (158 lb 4.6 oz)   SpO2 95%   BMI 22.71 kg/m  SpO2: SpO2: 95 % O2 Device: O2 Device: Nasal Cannula O2 Flow Rate: O2 Flow Rate (L/min): 2 L/min  Intake/output summary:   Intake/Output Summary (Last 24 hours) at 08/29/2017 0956 Last data filed at 08/29/2017 0353 Gross per 24 hour  Intake 470 ml  Output 1800 ml  Net -1330 ml   LBM: Last BM Date: 08/28/17 Baseline Weight: Weight: 68 kg (150 lb) Most recent weight: Weight: 71.8 kg (158 lb 4.6 oz)       Palliative Assessment/Data: 30%    Flowsheet Rows     Most Recent Value  Intake Tab  Referral Department  Hospitalist  Unit at Time of Referral  Med/Surg Unit  Palliative Care Primary Diagnosis  Cancer  Date Notified  08/21/17  Palliative Care Type  New Palliative care  Reason for referral  Pain  Date of Admission  08/20/17  Date first seen by Palliative Care  08/21/17  # of days Palliative referral response time  0 Day(s)  # of days IP prior to Palliative referral  1  Clinical Assessment  Psychosocial & Spiritual Assessment  Palliative Care Outcomes      Patient Active Problem List   Diagnosis Date Noted  . Malnutrition of moderate degree 08/24/2017  . Cancer associated pain   . Palliative care encounter   . Hypoxia 08/20/2017  . Acute respiratory failure with hypoxia ([place]) 08/20/2017  . COPD (chronic obstructive pulmonary disease) ([place]) 08/20/2017  . Constipation 08/14/2017  . Malignant tumor,  spindle cell type ([place]) 07/16/2017  . Goals of care, counseling/discussion 07/16/2017  . Chronic low back pain 07/04/2017  . Bone metastasis ([place]) 06/23/2017  . COPD with acute exacerbation ([place]) 05/15/2017  . Multiple lung nodules on CT   . Near syncope 05/03/2017  . Lung mass 05/03/2017  . Hyperlipidemia 09/20/2016  . Protein-calorie malnutrition ([place]) 08/17/2016  . Thoracic compression fracture, closed, initial encounter ([place]) 08/17/2016  . Alcohol dependence (Tanner [name]) 08/17/2016  . Tobacco dependence 08/17/2016  . Osteoporosis 08/17/2016  . Hip fracture ([place]) 08/16/2016  . Headache 02/24/2016  . Hypertensive urgency 02/03/2016  . Shortness of breath 07/28/2015  . Generalized anxiety disorder 07/28/2015  . Medicare annual wellness visit, subsequent 05/24/2015  . Acute DVT of right tibial vein ([place]) 02/27/2015  . Tibia fracture 02/20/2015  . Syncope 02/18/2015  . Essential hypertension 02/18/2015  . History of stroke 02/18/2015  . Fracture of tibia, right, closed   . C. difficile colitis 09/11/2013  . UTI (urinary tract infection) 09/11/2013  . Essential hypertension, benign 09/11/2013  . Ileus ([place]) 09/10/2013  . 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 Ileus, postoperative ([place]) 09/09/2013  . Hypokalemia 09/09/2013  . Nonspecific (abnormal) findings on radiological and other examination of gastrointestinal tract 09/09/2013  . CVA (cerebral infarction) 09/05/2013  . 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 Malignant neoplasm of prostate ([place]) 08/29/2013  . Hyponatremia 12/11/2012    Class: Chronic  . Ankle fracture 12/10/2012    Palliative Care Assessment & Plan   HPI: 63 y.o. male  with past medical history of recently diagnosed stage IV poorly differentiated spindle cell cancer with bone mets with rib fractures, h/o alcohol abuse, hepatitis, anxiety/depression, DVT, HTN, TIA admitted on 08/20/2017 with SOB likely r/t cancer and rib  mets/pathological fractures effecting respiratory effort with increasing pain. Palliative care requested for assistance in pain management.   Assessment: I met today with Tanner [name], ex-wife, and daughter. We further discussed radiation and there is no way he could tolerate radiation in his current state of pain. We agreed to put this off for one more day to consider if this is something he wants. We also discussed pain management and why his pain is so severe and I fear that he will unlikely be able to tolerate much activity. Reiterated our goal to manage pain at rest and minimize activity to allow for him to be awake enough to spend time with his family.   Will continue fentanyl patch but changing breakthrough pain meds to IV morphine and adding scheduled Valium today - discussed with pt and family at bedside.   Late entry: Revisit and Tanner [name] pain is down to 5/10 this afternoon and was able to eat some lunch. He says he feels better after the dose of IV morphine and OxyIR than he has for days. Will continue regimen overnight. Discussed with Tanner [name] that radiation treatment would likely require him to hold arms up and likely 10 treatments - Tanner [name] and family do not feel he could tolerate this. I am unsure that the pain from movement requirement to complete radiation is worth the treatment to him (he says his pain never improved with past radiation treatment). We will speak more about this tomorrow and he will continue to consider if he desires to try radiation or not. I doubt that he will want radiation as he has previously and consistently stated he was not interested in more radiation therapy.    Recommendations/Plan:  Bone mets pain:   OxyIR to 15 mg every 4 hours prn moderate pain. May consider changing to Roxanol prn for better pain management as he has better response with IV morphine.   Morphine 2 mg every 2 hours prn severe pain (changed from IV fentanyl).   Continue fentanyl patch 25 mcg/hr. Will  reassess need to increase tomorrow.   Will schedule Valium 5 mg TID.   Continue decadron 8 mg po TID for now.   Continue Lyrica 150 po BID.   He is not interested in pursuing any further radiation even if indicated.   LBM: LBM 2/20. Senokot-S 2 tabs and miralax daily. Dulcolax supp daily prn.    Nystatin suspension ordered for oral thrush. Denies pain.    Goals of Care and Additional Recommendations:  Limitations on Scope of Treatment: Focus on comfort  Code Status:  DNR   Prognosis:   Extremely poor with progressing advanced cancer and worsening pain and functional status. With current symptom burden he may likely be eligible for hospice facility.    Discharge Planning:  Will likely need hospice care. Hopefully hospice at home if pain can be better managed again.    Thank you for allowing the Palliative Medicine Team to assist in the care of this patient.   Total Time 0900-1030 1345-1400  105 min Prolonged Time Billed  yes      Greater than 50%  of this time was spent counseling and coordinating care related to the above assessment and plan.  Vinie Sill, NP Palliative Medicine Team Pager # [phone] (M-F 8a-5p) Team Phone # [phone] (Nights/Weekends)

## 2017-08-29 NOTE — Progress Notes (Signed)
CSW following to assist with disposition.  Assessed pt initially on 08/24/17 with potential plans of SNF for rehab or returning home to receive palliative chemo/radiation. However, since that time pt's course of treatment at changed- anticipating needing hospice services at [place]. Met with pt and his daughter and ex-wife (with whom he lives) at bedside today. They are concerned about managing pt's pain and having him be comfortable (palliative team involved as well and participated in discussion). Report pt unable to tolerate radiation today and concerned he will not be able to going forward. Disposition plan uncertain- goal to work toward pain management and decisions re: pursuing any further palliative treatment. Will continue following and assist.  Sharren Bridge, MSW, LCSW Clinical Social Work 08/29/2017 [phone]

## 2017-08-30 ENCOUNTER — Ambulatory Visit: Payer: Medicare HMO | Admitting: Radiation Oncology

## 2017-08-30 DIAGNOSIS — I10 Essential (primary) hypertension: Secondary | ICD-10-CM

## 2017-08-30 DIAGNOSIS — E440 Moderate protein-calorie malnutrition: Secondary | ICD-10-CM

## 2017-08-30 LAB — CREATININE, SERUM
Creatinine, Ser: 0.62 mg/dL (ref 0.61–1.24)
GFR calc Af Amer: >60 mL/min (ref >60)

## 2017-08-30 MED ORDER — MORPHINE SULFATE (PF) 2 MG/ML IV SOLN
2.0000 mg | INTRAVENOUS | Status: DC | PRN
  Administered 2017-08-30 – 2017-09-03 (×22): 2 mg via INTRAVENOUS
  Filled 2017-08-30 (×23): qty 1

## 2017-08-30 MED ORDER — METHOCARBAMOL 500 MG PO TABS
1000.0000 mg | ORAL_TABLET | Freq: Four times a day (QID) | ORAL | Status: DC | PRN
  Administered 2017-08-31: 1000 mg via ORAL
  Filled 2017-08-30: qty 2

## 2017-08-30 MED ORDER — MORPHINE SULFATE (CONCENTRATE) 10 MG/0.5ML PO SOLN
10.0000 mg | ORAL | Status: DC | PRN
  Administered 2017-08-30: 10 mg via ORAL
  Filled 2017-08-30: qty 0.5

## 2017-08-30 MED ORDER — FENTANYL 25 MCG/HR TD PT72
37.5000 ug | MEDICATED_PATCH | TRANSDERMAL | Status: DC
  Administered 2017-08-30 – 2017-09-02 (×2): 37.5 ug via TRANSDERMAL
  Filled 2017-08-30 (×2): qty 1

## 2017-08-30 MED ORDER — MORPHINE SULFATE (PF) 2 MG/ML IV SOLN
2.0000 mg | INTRAVENOUS | Status: AC
Start: 2017-08-30 — End: 2017-08-30
  Administered 2017-08-30: 2 mg via INTRAVENOUS

## 2017-08-30 MED ORDER — DIAZEPAM 5 MG PO TABS: 10.0000 mg | ORAL_TABLET | Freq: Every day | ORAL | Status: DC

## 2017-08-30 MED ORDER — DIAZEPAM 5 MG PO TABS
10.0000 mg | ORAL_TABLET | Freq: Every day | ORAL | Status: DC
  Administered 2017-08-30: 10 mg via ORAL
  Filled 2017-08-30: qty 2

## 2017-08-30 MED ORDER — METHOCARBAMOL 500 MG PO TABS: 500.0000 mg | ORAL_TABLET | Freq: Three times a day (TID) | ORAL | Status: DC | PRN

## 2017-08-30 MED ORDER — METHOCARBAMOL 500 MG PO TABS
500.0000 mg | ORAL_TABLET | Freq: Four times a day (QID) | ORAL | Status: DC | PRN
  Administered 2017-08-30: 500 mg via ORAL
  Filled 2017-08-30: qty 1

## 2017-08-30 MED ORDER — FENTANYL 50 MCG/HR TD PT72: 50.0000 ug | MEDICATED_PATCH | TRANSDERMAL | Status: DC

## 2017-08-30 MED ORDER — MORPHINE SULFATE (CONCENTRATE) 10 MG/0.5ML PO SOLN
15.0000 mg | ORAL | Status: DC | PRN
  Administered 2017-08-30 – 2017-08-31 (×6): 20 mg via ORAL
  Filled 2017-08-30 (×7): qty 1

## 2017-08-30 MED ORDER — MORPHINE SULFATE (CONCENTRATE) 10 MG/0.5ML PO SOLN
15.0000 mg | ORAL | Status: DC | PRN
  Administered 2017-08-30: 15 mg via ORAL
  Filled 2017-08-30: qty 1

## 2017-08-30 MED ORDER — DIAZEPAM 5 MG PO TABS
5.0000 mg | ORAL_TABLET | Freq: Two times a day (BID) | ORAL | Status: DC
  Administered 2017-08-30: 5 mg via ORAL
  Filled 2017-08-30: qty 1

## 2017-08-30 NOTE — Progress Notes (Signed)
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  Also presented with severe pain due to multiple bony lytic lesions.  Hospitalized for pain control.  Reason for Visit: Stage IV cancer  Consultants: Palliative medicine.  Oncology  Procedures: None  Antibiotics: None  Subjective/Interval History: Patient states that he had a better night last night.  Was able to sleep.  Pain is down to 4 out of 10 in intensity this morning.  His ex-wife is at the bedside.    ROS: Denies any nausea or vomiting  Objective:  Vital Signs  Vitals:   08/29/17 2018 08/29/17 2159 08/30/17 0509 08/30/17 0724  BP:  (!) 138/96 (!) 156/97   Pulse:  100 77   Resp:  18 18   Temp:  98.6 F (37 C) 98.9 F (37.2 C)   TempSrc:  Oral Oral   SpO2: 98% 99% 98% 95%  Weight:      Height:        Intake/Output Summary (Last 24 hours) at 08/30/2017 0840 Last data filed at 08/30/2017 0511 Gross per 24 hour  Intake 830 ml  Output 2700 ml  Net -1870 ml   Filed Weights   08/24/17 1556 08/25/17 0406 08/27/17 1831  Weight: 67.2 kg (148 lb 2.4 oz) 68.4 kg (150 lb 12.7 oz) 71.8 kg (158 lb 4.6 oz)    General appearance: Seems more comfortable this morning. Resp: Clear to auscultation bilaterally.  No wheezing rales or rhonchi. Cardio: S1-S2 is normal regular.  No S3 or S4.  No rubs murmurs or bruit GI: Abdomen remains diffusely tender without any rebound rigidity or guarding. Neurologic: No focal neurological deficits.   Lab Results:  Data Reviewed: I have personally reviewed following labs and imaging studies  CBC: Recent Labs  Lab 08/25/17 0352 08/25/17 1802 08/26/17 0337 08/27/17 0312 08/27/17 1443 08/28/17 0337  WBC 8.4 11.1* 9.9 9.7  --   12.9*  HGB 9.3* 10.0* 8.7* 8.2* 8.5* 9.1*  HCT 28.8* 30.3* 26.9* 23.9* 25.2* 27.6*  MCV 98.3 98.7 98.9 97.6  --  96.8  PLT 261 236 209 184  --  413    Basic Metabolic Panel: Recent Labs  Lab 08/25/17 0352 08/25/17 1802 08/26/17 0337 08/27/17 0312 08/28/17 0337 08/29/17 0406 08/30/17 0352  NA 136 133* 134* 136 132*  --   --   K 4.3 4.7 4.2 4.1 4.3  --   --   CL 93* 92* 99* 100* 94*  --   --   CO2 33* 31 27 27 27   --   --   GLUCOSE 128* 127* 174* 154* 135*  --   --   BUN 26* 29* 27* 23* 21*  --   --   CREATININE 0.64 0.76 0.70 0.65 0.63 0.66 0.62  CALCIUM 9.4 9.2 8.4* 8.6* 8.5*  --   --   MG 2.0  --   --  1.8 1.8  --   --     GFR: Estimated Creatinine Clearance: 101 mL/min (by C-G formula based on SCr of 0.62 mg/dL).  Liver Function Tests: Recent Labs  Lab 08/28/17 0337  AST 26  ALT 24  ALKPHOS 67  BILITOT 0.5  PROT 5.6*  ALBUMIN 2.1*    Coagulation Profile: Recent Labs  Lab 08/25/17 2250  INR 1.12  Cardiac Enzymes: Recent Labs  Lab 08/28/17 0931  TROPONINI <0.03     Recent Results (from the past 240 hour(s))  Urine culture     Status: None   Collection Time: 08/25/17  7:44 PM  Result Value Ref Range Status   Specimen Description   Final    URINE, CLEAN CATCH Performed at [facility], [place] [address] [place], [place] [address]    Special Requests   Final    NONE Performed at [facility], [place] [address] [place], [place] [address]    Culture   Final    NO GROWTH Performed at [facility], [place] [address], [place], [place] [address]    Report Status 08/27/2017 FINAL  Final  Culture, blood (x 2)     Status: None (Preliminary result)   Collection Time: 08/25/17  8:18 PM  Result Value Ref Range Status   Specimen Description BLOOD RIGHT ANTECUBITAL  Final   Special Requests IN PEDIATRIC BOTTLE Blood Culture adequate volume  Final   Culture   Final    NO GROWTH 3 DAYS Performed at [facility], [place] [address], [place], [place] [address]    Report Status PENDING  Incomplete  Culture, blood (x 2)     Status: None (Preliminary result)   Collection Time: 08/25/17  8:18 PM  Result Value Ref Range Status   Specimen Description   Final    BLOOD RIGHT HAND Performed at [place] [address], [place], [place] [address]    Special Requests   Final    BOTTLES DRAWN AEROBIC ONLY Blood Culture adequate volume   Culture   Final    NO GROWTH 3 DAYS Performed at [facility], [place] [address], [place], [place] [address]    Report Status PENDING  Incomplete  MRSA PCR Screening     Status: None   Collection Time: 08/26/17 10:19 PM  Result Value Ref Range Status   MRSA by PCR NEGATIVE NEGATIVE Final    Comment:        The GeneXpert MRSA Assay (FDA approved for NASAL specimens only), is one component of a comprehensive MRSA colonization surveillance program. It is not intended to diagnose MRSA infection nor to guide or monitor treatment for MRSA infections. Performed at [facility], [place] [address], [place],  [address]       Radiology Studies: No results found.   Medications:  Scheduled: . acetaminophen  1,000 mg Oral Q8H  . dexamethasone  8 mg Oral Q8H  . diazepam  5 mg Oral TID  . dronabinol  5 mg Oral TID AC  . enoxaparin (LOVENOX) injection  40 mg Subcutaneous Daily  . feeding supplement (ENSURE ENLIVE)  237 mL Oral TID BM  . fentaNYL  25 mcg Transdermal Q72H  . guaiFENesin  600 mg Oral BID  . ipratropium-albuterol  3 mL Nebulization BID  . multivitamin with minerals  1 tablet Oral Daily  . nystatin  5 mL Mouth/Throat QID  . pantoprazole  40 mg Oral Daily  . polyethylene glycol  17 g Oral Daily  . pregabalin  150 mg Oral BID  . PRUTECT  1 application Topical BID  . senna-docusate  2 tablet Oral QHS  . simvastatin  20 mg Oral q1800  . sodium chloride flush  3 mL Intravenous Q12H  . thiamine  100 mg  Oral Daily   Continuous: . sodium chloride    .  piperacillin-tazobactam (ZOSYN)  IV 3.375 g (08/30/17 0522)   MCE:YEMVVK chloride, albuterol, bisacodyl, calcium carbonate, Gerhardt's butt cream, morphine injection, naLOXone (NARCAN)  injection, ondansetron (ZOFRAN) IV, ondansetron, oxyCODONE, sodium chloride flush, traZODone  Assessment/Plan:  Principal Problem:   Acute respiratory failure with hypoxia (HCC) Active Problems:   Essential hypertension, benign   Generalized anxiety disorder   Bone metastasis (HCC)   Metastatic cancer (HCC)   Hypoxia   COPD (chronic obstructive pulmonary disease) ([place])   Cancer associated pain   Palliative care encounter   Malnutrition of moderate degree    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  Patient is status post palliative radiotherapy and has been on systemic chemotherapy.  CT scan from 2/19 shows worsening of metastases.  Seen by oncologist who states that his prognosis is very poor.  Palliative medicine was consulted.  Patient is DNR.  Patient's pain was very poorly controlled.  His pain medications were adjusted.  He was started on long-acting and short acting pain medication regimen.  He is also on a bowel regimen.  Lidocaine patch for localized pain.  Patient also seen by orthopedics due to lesions in the left femur and pelvis.  No clear indication for surgical intervention at this time.  Patient also on dexamethasone orally. The plan is for patient to go to hospice.  Palliative medicine is looking into this.  Acute respiratory failure with hypoxia with concern for pneumonia On 2/16 patient had to be transferred to ICU for somnolence and increased O2 requirement.  This was thought to be secondary to narcotics.  He was given Narcan with improvement.  Then transferred back to floor.  CT scan done on 2/17 notable for progression in right lower lobe airspace consolidation with atelectasis.   Continue incentive spirometry.  No PE was noted.  Noted to be on Zosyn.  Stop after 7 days.  Last day will be 2/23.  But this could be stopped earlier if he goes to residential hospice.  Acute encephalopathy Most likely due to narcotics.  Now resolved.    Urinary retention MRI he did not suggest any cauda equina.  Urine retention could be due to patient's immobility.  Continue Foley catheter for comfort purposes.  History of COPD Continue duo nebs as needed.  History of essential hypertension Initially blood pressure was low and so was home medications were held.  Pressures are stable with occasional hypertensive readings.  Continue to monitor for now.    Hyperlipidemia Continue statins.  History of anemia Hemoglobin is stable.  No evidence for overt bleeding.  Severe protein calorie malnutrition Continue nutritional supplements.  DVT Prophylaxis: Lovenox    Code Status: DNR Family Communication: Discussed with the patient. Disposition Plan: Management as outlined above.    LOS: 9 days   [facility] Pager [phone] 08/30/2017, 8:40 AM  If 7PM-7AM, please contact night-coverage at [email or web address], password [facility]

## 2017-08-30 NOTE — Progress Notes (Signed)
I spoke with the patient and his ex wife as well. He is leaning toward hospice care and we discussed the concerns he has about not finding relief following his last treatment, as well as fears of being comfortable on the treatment table. The palliative care team has expressed their concern that his prognosis does not match the timing we would need to achieve symptom relief if we did pursue radiotherapy. We will hold off on treatment and the patient is in agreement with this approach. He will contact us as needed moving forward.     Carola Rhine, PAC

## 2017-08-30 NOTE — Progress Notes (Signed)
Daily Progress Note   Patient Name: Tanner [name]       Date: 08/30/2017 DOB: 07-Jul-1956  Age: 62 y.o. MRN#: [id number] Attending Physician: Bonnielee Haff, MD Primary Care Physician: [name], NP Admit Date: 08/20/2017  Reason for Consultation/Follow-up: Pain control, GOC  Subjective: Tanner [name] continues with pain mainly in ribs and worsened with movement. Pain overall is better the past 24 hours ~5-6/10 and appetite has improved.   Length of Stay: 9  Current Medications: Scheduled Meds:  . acetaminophen  1,000 mg Oral Q8H  . dexamethasone  8 mg Oral Q8H  . diazepam  5 mg Oral TID  . dronabinol  5 mg Oral TID AC  . enoxaparin (LOVENOX) injection  40 mg Subcutaneous Daily  . feeding supplement (ENSURE ENLIVE)  237 mL Oral TID BM  . fentaNYL  50 mcg Transdermal Q72H  . guaiFENesin  600 mg Oral BID  . ipratropium-albuterol  3 mL Nebulization BID  . multivitamin with minerals  1 tablet Oral Daily  . nystatin  5 mL Mouth/Throat QID  . pantoprazole  40 mg Oral Daily  . polyethylene glycol  17 g Oral Daily  . pregabalin  150 mg Oral BID  . PRUTECT  1 application Topical BID  . senna-docusate  2 tablet Oral QHS  . simvastatin  20 mg Oral q1800  . sodium chloride flush  3 mL Intravenous Q12H  . thiamine  100 mg Oral Daily    Continuous Infusions: . sodium chloride    . piperacillin-tazobactam (ZOSYN)  IV 3.375 g (08/30/17 0522)    PRN Meds: sodium chloride, albuterol, bisacodyl, calcium carbonate, Gerhardt's butt cream, morphine injection, morphine CONCENTRATE, naLOXone (NARCAN)  injection, ondansetron (ZOFRAN) IV, ondansetron, sodium chloride flush, traZODone  Physical Exam  Constitutional: He is oriented to person, place, and time. He appears well-developed. He appears  ill.  HENT:  Head: Normocephalic and atraumatic.  Cardiovascular: Normal rate and regular rhythm.  Pulmonary/Chest: No accessory muscle usage. No tachypnea. No respiratory distress.  Oxygen needs decreasing  Abdominal: Normal appearance.  Neurological: He is alert and oriented to person, place, and time.  Nursing note and vitals reviewed.           Vital Signs: BP (!) 156/97 (BP Location: Right Arm)   Pulse 77   Temp 98.9 F (37.2  C) (Oral)   Resp 18   Ht 5' 10"  (1.778 m)   Wt 71.8 kg (158 lb 4.6 oz)   SpO2 95%   BMI 22.71 kg/m  SpO2: SpO2: 95 % O2 Device: O2 Device: Nasal Cannula O2 Flow Rate: O2 Flow Rate (L/min): 2 L/min  Intake/output summary:   Intake/Output Summary (Last 24 hours) at 08/30/2017 8850 Last data filed at 08/30/2017 2774 Gross per 24 hour  Intake 830 ml  Output 2700 ml  Net -1870 ml   LBM: Last BM Date: 08/29/17 Baseline Weight: Weight: 68 kg (150 lb) Most recent weight: Weight: 71.8 kg (158 lb 4.6 oz)       Palliative Assessment/Data: 30%    Flowsheet Rows     Most Recent Value  Intake Tab  Referral Department  Hospitalist  Unit at Time of Referral  Med/Surg Unit  Palliative Care Primary Diagnosis  Cancer  Date Notified  08/21/17  Palliative Care Type  New Palliative care  Reason for referral  Pain  Date of Admission  08/20/17  Date first seen by Palliative Care  08/21/17  # of days Palliative referral response time  0 Day(s)  # of days IP prior to Palliative referral  1  Clinical Assessment  Psychosocial & Spiritual Assessment  Palliative Care Outcomes      Patient Active Problem List   Diagnosis Date Noted  . Malnutrition of moderate degree 08/24/2017  . Cancer associated pain   . Palliative care encounter   . Hypoxia 08/20/2017  . Acute respiratory failure with hypoxia ([place]) 08/20/2017  . COPD (chronic obstructive pulmonary disease) ([place]) 08/20/2017  . Constipation 08/14/2017  . Metastatic cancer ([place]) 07/16/2017  . Goals  of care, counseling/discussion 07/16/2017  . Chronic low back pain 07/04/2017  . Bone metastasis ([place]) 06/23/2017  . COPD with acute exacerbation ([place]) 05/15/2017  . Multiple lung nodules on CT   . Near syncope 05/03/2017  . Lung mass 05/03/2017  . Hyperlipidemia 09/20/2016  . Protein-calorie malnutrition ([place]) 08/17/2016  . Thoracic compression fracture, closed, initial encounter ([place]) 08/17/2016  . Alcohol dependence ([place]) 08/17/2016  . Tobacco dependence 08/17/2016  . Osteoporosis 08/17/2016  . Hip fracture ([place]) 08/16/2016  . Headache 02/24/2016  . Hypertensive urgency 02/03/2016  . Shortness of breath 07/28/2015  . Generalized anxiety disorder 07/28/2015  . Medicare annual wellness visit, subsequent 05/24/2015  . Acute DVT of right tibial vein ([place]) 02/27/2015  . Tibia fracture 02/20/2015  . Syncope 02/18/2015  . Essential hypertension 02/18/2015  . History of stroke 02/18/2015  . Fracture of tibia, right, closed   . C. difficile colitis 09/11/2013  . UTI (urinary tract infection) 09/11/2013  . Essential hypertension, benign 09/11/2013  . Ileus ([place]) 09/10/2013  . 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 Ileus, postoperative ([place]) 09/09/2013  . Hypokalemia 09/09/2013  . Nonspecific (abnormal) findings on radiological and other examination of gastrointestinal tract 09/09/2013  . CVA (cerebral infarction) 09/05/2013  . 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 Malignant neoplasm of prostate ([place]) 08/29/2013  . Hyponatremia 12/11/2012    Class: Chronic  . Ankle fracture 12/10/2012    Palliative Care Assessment & Plan   HPI: 62 y.o. male  with past medical history of recently diagnosed stage IV poorly differentiated spindle cell cancer with bone mets with rib fractures, h/o alcohol abuse, hepatitis, anxiety/depression, DVT, HTN, TIA admitted on 08/20/2017 with SOB likely r/t cancer and rib mets/pathological fractures effecting respiratory effort  with increasing pain. Palliative care  requested for assistance in pain management.   Assessment: I met again today with Tanner [name] and ex-wife and daughter. Discussed pain management and plan as listed below. Still with severe pain with even slight movement. Unfortunately with bony mets placement movement is going to be painful. Hopeful for pain to be more tolerable with slight movement in bed such as turning but he will not be able to tolerate activity out of bed in my opinion without severe pain. He has decided against radiation.   I have spoken with them about hospice. Recommend transition to hospice facility when pain better controlled (not stable to transfer at this time as unable to tolerate any movement). I will continue to follow.   Recommendations/Plan:  Bone mets pain:   D/C OxyIR and added Roxanol 15 mg every 2 hours prn pain/SOB (tried 10 mg but did not provide adequate relief).   Morphine 2 mg every 2 hours prn severe pain.  Increase fentanyl patch from 25 to 37.5 mcg/hr. Will continue to reassess. Increasing conservatively d/t oversedation over the weekend.    Scheduled Valium 5 mg TID.   Complaining more of muscle spasms today: Adding Robaxin 500 mg po every 6 hours prn. Heat pack prn.   Continue decadron 8 mg po TID for now.   Continue Lyrica 150 po BID.   He is not interested in pursuing any further radiation even if indicated.   LBM: LBM 2/20. Senokot-S 2 tabs and miralax daily. Dulcolax supp daily prn.    Nystatin suspension ordered for oral thrush. Denies pain.    Goals of Care and Additional Recommendations:  Limitations on Scope of Treatment: Focus on comfort  Code Status:  DNR   Prognosis:   Extremely poor with progressing advanced cancer and worsening pain and functional status. With current symptom burden he may likely be eligible for hospice facility.    Discharge Planning:  Will likely need hospice care. Hopefully hospice at home if pain can be  better managed again.    Thank you for allowing the Palliative Medicine Team to assist in the care of this patient.   Total Time 60  min Prolonged Time Billed  no      Greater than 50%  of this time was spent counseling and coordinating care related to the above assessment and plan.  Vinie Sill, NP Palliative Medicine Team Pager # [phone] (M-F 8a-5p) Team Phone # [phone] (Nights/Weekends)

## 2017-08-31 ENCOUNTER — Ambulatory Visit: Payer: Medicare HMO

## 2017-08-31 LAB — CULTURE, BLOOD (ROUTINE X 2)
CULTURE: NO GROWTH
Culture: NO GROWTH
SPECIAL REQUESTS: ADEQUATE
SPECIAL REQUESTS: ADEQUATE

## 2017-08-31 LAB — CREATININE, SERUM
CREATININE: 0.62 mg/dL (ref 0.61–1.24)
GFR calc non Af Amer: >60 mL/min (ref >60)

## 2017-08-31 MED ORDER — DIAZEPAM 5 MG PO TABS
10.0000 mg | ORAL_TABLET | Freq: Three times a day (TID) | ORAL | Status: DC
  Administered 2017-08-31 – 2017-09-04 (×17): 10 mg via ORAL
  Filled 2017-08-31 (×17): qty 2

## 2017-08-31 MED ORDER — MORPHINE SULFATE (CONCENTRATE) 10 MG/0.5ML PO SOLN
15.0000 mg | ORAL | Status: DC | PRN
  Administered 2017-08-31 (×2): 10 mg via ORAL
  Administered 2017-09-02: 25 mg via ORAL
  Administered 2017-09-03 – 2017-09-04 (×5): 15 mg via ORAL
  Filled 2017-08-31 (×7): qty 1

## 2017-08-31 MED ORDER — TRAZODONE HCL 50 MG PO TABS: 50.0000 mg | ORAL_TABLET | Freq: Every evening | ORAL | Status: DC | PRN

## 2017-08-31 MED ORDER — TRAZODONE HCL 50 MG PO TABS
50.0000 mg | ORAL_TABLET | Freq: Every day | ORAL | Status: DC
  Administered 2017-08-31 – 2017-09-03 (×4): 50 mg via ORAL
  Filled 2017-08-31 (×4): qty 1

## 2017-08-31 MED ORDER — DIAZEPAM 5 MG PO TABS: 10.0000 mg | ORAL_TABLET | Freq: Four times a day (QID) | ORAL | Status: DC

## 2017-08-31 MED ORDER — MORPHINE SULFATE (CONCENTRATE) 10 MG/0.5ML PO SOLN
25.0000 mg | ORAL | Status: DC
  Administered 2017-08-31: 25 mg via ORAL
  Administered 2017-08-31: 10 mg via ORAL
  Administered 2017-08-31 – 2017-09-04 (×23): 25 mg via ORAL
  Filled 2017-08-31 (×27): qty 1.5

## 2017-08-31 MED ORDER — SIMETHICONE 80 MG PO CHEW
80.0000 mg | CHEWABLE_TABLET | Freq: Four times a day (QID) | ORAL | Status: DC | PRN
  Administered 2017-09-03: 80 mg via ORAL
  Filled 2017-08-31: qty 1

## 2017-08-31 MED ORDER — FLUOXETINE HCL 20 MG PO CAPS
40.0000 mg | ORAL_CAPSULE | Freq: Every day | ORAL | Status: DC
  Administered 2017-08-31 – 2017-09-04 (×5): 40 mg via ORAL
  Filled 2017-08-31 (×5): qty 2

## 2017-08-31 NOTE — Progress Notes (Signed)
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  Also presented with severe pain due to multiple bony lytic lesions.  Hospitalized for pain control.  Reason for Visit: Stage IV cancer  Consultants: Palliative medicine.  Oncology  Procedures: None  Antibiotics: None  Subjective/Interval History: Patient in a lot of discomfort and pain this morning.  States that he could not sleep due to pain issues and shortness of breath.  His ex-wife is at the bedside.     ROS: Denies any chest pain.  Denies any nausea or vomiting.  Objective:  Vital Signs  Vitals:   08/30/17 0724 08/30/17 1340 08/30/17 2139 08/31/17 0417  BP:  (!) 142/90 120/83 (!) 161/77  Pulse:  83 97 85  Resp:  18 16 18   Temp:  98.9 F (37.2 C) 97.8 F (36.6 C) 97.9 F (36.6 C)  TempSrc:  Oral Oral Oral  SpO2: 95% 95% 97% 98%  Weight:      Height:        Intake/Output Summary (Last 24 hours) at 08/31/2017 0817 Last data filed at 08/31/2017 0434 Gross per 24 hour  Intake 120 ml  Output 2100 ml  Net -1980 ml   Filed Weights   08/24/17 1556 08/25/17 0406 08/27/17 1831  Weight: 67.2 kg (148 lb 2.4 oz) 68.4 kg (150 lb 12.7 oz) 71.8 kg (158 lb 4.6 oz)    General appearance: In discomfort this morning.  No distress. Resp: Lungs remain clear to auscultation bilaterally. Cardio: S1-S2 is normal regular.  No S3-S4.  No rubs murmurs of bruit GI: Abdomen remains diffusely tender without any rebound rigidity or guarding. Neurologic: No obvious focal neurological deficits.   Lab Results:  Data Reviewed: I have personally reviewed following labs and imaging studies  CBC: Recent Labs  Lab 08/25/17 0352 08/25/17 1802 08/26/17 0337 08/27/17 0312  08/27/17 1443 08/28/17 0337  WBC 8.4 11.1* 9.9 9.7  --  12.9*  HGB 9.3* 10.0* 8.7* 8.2* 8.5* 9.1*  HCT 28.8* 30.3* 26.9* 23.9* 25.2* 27.6*  MCV 98.3 98.7 98.9 97.6  --  96.8  PLT 261 236 209 184  --  097    Basic Metabolic Panel: Recent Labs  Lab 08/25/17 0352 08/25/17 1802 08/26/17 0337 08/27/17 0312 08/28/17 0337 08/29/17 0406 08/30/17 0352 08/31/17 0328  NA 136 133* 134* 136 132*  --   --   --   K 4.3 4.7 4.2 4.1 4.3  --   --   --   CL 93* 92* 99* 100* 94*  --   --   --   CO2 33* 31 27 27 27   --   --   --   GLUCOSE 128* 127* 174* 154* 135*  --   --   --   BUN 26* 29* 27* 23* 21*  --   --   --   CREATININE 0.64 0.76 0.70 0.65 0.63 0.66 0.62 0.62  CALCIUM 9.4 9.2 8.4* 8.6* 8.5*  --   --   --   MG 2.0  --   --  1.8 1.8  --   --   --     GFR: Estimated Creatinine Clearance: 101 mL/min (by C-G formula based on SCr of 0.62 mg/dL).  Liver Function Tests: Recent Labs  Lab 08/28/17 0337  AST  26  ALT 24  ALKPHOS 67  BILITOT 0.5  PROT 5.6*  ALBUMIN 2.1*    Coagulation Profile: Recent Labs  Lab 08/25/17 2250  INR 1.12    Cardiac Enzymes: Recent Labs  Lab 08/28/17 0931  TROPONINI <0.03     Recent Results (from the past 240 hour(s))  Urine culture     Status: None   Collection Time: 08/25/17  7:44 PM  Result Value Ref Range Status   Specimen Description   Final    URINE, CLEAN CATCH Performed at [facility], [place] [address] [place], [place] [address]    Special Requests   Final    NONE Performed at [facility], [place] [address], [place], [place] [address]    Culture   Final    NO GROWTH Performed at [facility], [place] [address] [place], [place] [address]    Report Status 08/27/2017 FINAL  Final  Culture, blood (x 2)     Status: None (Preliminary result)   Collection Time: 08/25/17  8:18 PM  Result Value Ref Range Status   Specimen Description BLOOD RIGHT ANTECUBITAL  Final   Special Requests IN  PEDIATRIC BOTTLE Blood Culture adequate volume  Final   Culture   Final    NO GROWTH 4 DAYS Performed at [facility], [place] [address], [place], [place] [address]    Report Status PENDING  Incomplete  Culture, blood (x 2)     Status: None (Preliminary result)   Collection Time: 08/25/17  8:18 PM  Result Value Ref Range Status   Specimen Description   Final    BLOOD RIGHT HAND Performed at [place] [address], [place], [place] [address]    Special Requests   Final    BOTTLES DRAWN AEROBIC ONLY Blood Culture adequate volume   Culture   Final    NO GROWTH 4 DAYS Performed at [facility], [place] [address], [place], [place] [address]    Report Status PENDING  Incomplete  MRSA PCR Screening     Status: None   Collection Time: 08/26/17 10:19 PM  Result Value Ref Range Status   MRSA by PCR NEGATIVE NEGATIVE Final    Comment:        The GeneXpert MRSA Assay (FDA approved for NASAL specimens only), is one component of a comprehensive MRSA colonization surveillance program. It is not intended to diagnose MRSA infection nor to guide or monitor treatment for MRSA infections. Performed at [facility], [place] [address] [place],  [address]       Radiology Studies: No results found.   Medications:  Scheduled: . acetaminophen  1,000 mg Oral Q8H  . dexamethasone  8 mg Oral Q8H  . diazepam  10 mg Oral QHS  . diazepam  5 mg Oral BID  . dronabinol  5 mg Oral TID AC  . enoxaparin (LOVENOX) injection  40 mg Subcutaneous Daily  . feeding supplement (ENSURE ENLIVE)  237 mL Oral TID BM  . fentaNYL  37.5 mcg Transdermal Q72H  . guaiFENesin  600 mg Oral BID  . ipratropium-albuterol  3 mL Nebulization BID  . multivitamin with minerals  1 tablet Oral Daily  . nystatin  5 mL Mouth/Throat QID  . pantoprazole  40 mg Oral Daily  . polyethylene glycol  17 g Oral Daily  . pregabalin  150 mg Oral BID  . PRUTECT  1 application  Topical BID  .  senna-docusate  2 tablet Oral QHS  . simvastatin  20 mg Oral q1800  . sodium chloride flush  3 mL Intravenous Q12H  . thiamine  100 mg Oral Daily   Continuous: . sodium chloride    . piperacillin-tazobactam (ZOSYN)  IV Stopped (08/31/17 0131)   HDQ:QIWLNL chloride, albuterol, bisacodyl, calcium carbonate, Gerhardt's butt cream, methocarbamol, morphine injection, morphine CONCENTRATE, naLOXone (NARCAN)  injection, ondansetron (ZOFRAN) IV, ondansetron, sodium chloride flush, traZODone  Assessment/Plan:  Principal Problem:   Acute respiratory failure with hypoxia (HCC) Active Problems:   Essential hypertension, benign   Generalized anxiety disorder   Bone metastasis (HCC)   Metastatic cancer (HCC)   Hypoxia   COPD (chronic obstructive pulmonary disease) ([place])   Cancer associated pain   Palliative care encounter   Malnutrition of moderate degree    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  Patient is status post palliative radiotherapy and has been on systemic chemotherapy.  CT scan from 2/19 shows worsening of metastases.  Seen by oncologist who states that his prognosis is very poor. Patient also seen by orthopedics due to lesions in the left femur and pelvis.  No clear indication for surgical intervention at this time.  Patient also on dexamethasone orally.   Palliative medicine was consulted.  Patient is DNR.  Patient's pain was very poorly controlled.  His pain medications were adjusted.  He was started on long-acting and short acting pain medication regimen.  He is also on a bowel regimen.  Lidocaine patch for localized pain.  States that his pain is very poorly controlled this morning.  He will be given next pain medications.  Palliative medicine to provide further input.    Acute respiratory failure with hypoxia with concern for pneumonia On 2/16 patient had to be transferred to ICU for somnolence and increased O2  requirement.  This was thought to be secondary to narcotics.  He was given Narcan with improvement.  Then transferred back to floor.  CT scan done on 2/17 notable for progression in right lower lobe airspace consolidation with atelectasis.  Continue incentive spirometry.  No PE was noted.  Noted to be on Zosyn.  Stop after 7 days.  Last day will be 2/23.  But this could be stopped earlier if he goes to residential hospice.  Respiratory issues are stable.  Dyspnea is most likely due to pain.  Acute encephalopathy Most likely due to narcotics.  Now resolved.    Urinary retention MRI he did not suggest any cauda equina.  Urine retention could be due to patient's immobility.  Continue Foley catheter for comfort purposes.  History of COPD Continue duo nebs as needed.  History of essential hypertension Initially blood pressure was low and so was home medications were held.  Pressures are stable with occasional hypertensive readings which could be due to pain.  Continue to monitor for now.    Hyperlipidemia Continue statins.  History of anemia Hemoglobin is stable.  No evidence for overt bleeding.  Severe protein calorie malnutrition Continue nutritional supplements.  DVT Prophylaxis: Lovenox    Code Status: DNR Family Communication: Discussed with the patient. Disposition Plan: Await better pain control.  If this can be achieved patient will like to go home but if not then he may need to go to residential hospice.  Disposition is not clear yet.    LOS: 10 days   [facility] Pager [phone] 08/31/2017, 8:17 AM  If 7PM-7AM, please contact  night-coverage at [email or web address], password [facility]

## 2017-08-31 NOTE — Progress Notes (Signed)
Daily Progress Note   Patient Name: Tanner [name]       Date: 08/31/2017 DOB: 1955-11-05  Age: 62 y.o. MRN#: [id number] Attending Physician: Bonnielee Haff, MD Primary Care Physician: [name], NP Admit Date: 08/20/2017  Reason for Consultation/Follow-up: Pain control, GOC  Subjective: Tanner [name] had a difficult night with both pain and anxiety requiring frequent prn medication.   Length of Stay: 10  Current Medications: Scheduled Meds:  . acetaminophen  1,000 mg Oral Q8H  . dexamethasone  8 mg Oral Q8H  . diazepam  10 mg Oral TID AC & HS  . dronabinol  5 mg Oral TID AC  . enoxaparin (LOVENOX) injection  40 mg Subcutaneous Daily  . feeding supplement (ENSURE ENLIVE)  237 mL Oral TID BM  . fentaNYL  37.5 mcg Transdermal Q72H  . FLUoxetine  40 mg Oral Daily  . guaiFENesin  600 mg Oral BID  . ipratropium-albuterol  3 mL Nebulization BID  . morphine CONCENTRATE  25 mg Oral Q4H  . multivitamin with minerals  1 tablet Oral Daily  . nystatin  5 mL Mouth/Throat QID  . pantoprazole  40 mg Oral Daily  . polyethylene glycol  17 g Oral Daily  . pregabalin  150 mg Oral BID  . PRUTECT  1 application Topical BID  . senna-docusate  2 tablet Oral QHS  . simvastatin  20 mg Oral q1800  . sodium chloride flush  3 mL Intravenous Q12H  . thiamine  100 mg Oral Daily  . traZODone  50 mg Oral QHS    Continuous Infusions: . sodium chloride    . piperacillin-tazobactam (ZOSYN)  IV Stopped (08/31/17 0131)    PRN Meds: sodium chloride, albuterol, bisacodyl, calcium carbonate, Gerhardt's butt cream, methocarbamol, morphine injection, morphine CONCENTRATE, naLOXone (NARCAN)  injection, ondansetron (ZOFRAN) IV, ondansetron, sodium chloride flush  Physical Exam  Constitutional: He is oriented  to person, place, and time. He appears well-developed. He appears ill.  HENT:  Head: Normocephalic and atraumatic.  Cardiovascular: Normal rate and regular rhythm.  Pulmonary/Chest: No accessory muscle usage. No tachypnea. No respiratory distress.  Oxygen needs decreasing  Abdominal: Normal appearance.  Neurological: He is alert and oriented to person, place, and time.  Nursing note and vitals reviewed.           Vital Signs:  BP (!) 161/77 (BP Location: Left Leg)   Pulse 85   Temp 97.9 F (36.6 C) (Oral)   Resp 18   Ht 5' 10"  (1.778 m)   Wt 71.8 kg (158 lb 4.6 oz)   SpO2 95%   BMI 22.71 kg/m  SpO2: SpO2: 95 % O2 Device: O2 Device: Nasal Cannula O2 Flow Rate: O2 Flow Rate (L/min): 1 L/min  Intake/output summary:   Intake/Output Summary (Last 24 hours) at 08/31/2017 1041 Last data filed at 08/31/2017 0434 Gross per 24 hour  Intake 120 ml  Output 2100 ml  Net -1980 ml   LBM: Last BM Date: 08/30/17 Baseline Weight: Weight: 68 kg (150 lb) Most recent weight: Weight: 71.8 kg (158 lb 4.6 oz)       Palliative Assessment/Data: 30%    Flowsheet Rows     Most Recent Value  Intake Tab  Referral Department  Hospitalist  Unit at Time of Referral  Med/Surg Unit  Palliative Care Primary Diagnosis  Cancer  Date Notified  08/21/17  Palliative Care Type  New Palliative care  Reason for referral  Pain  Date of Admission  08/20/17  Date first seen by Palliative Care  08/21/17  # of days Palliative referral response time  0 Day(s)  # of days IP prior to Palliative referral  1  Clinical Assessment  Psychosocial & Spiritual Assessment  Palliative Care Outcomes      Patient Active Problem List   Diagnosis Date Noted  . Malnutrition of moderate degree 08/24/2017  . Cancer associated pain   . Palliative care encounter   . Hypoxia 08/20/2017  . Acute respiratory failure with hypoxia ([place]) 08/20/2017  . COPD (chronic obstructive pulmonary disease) ([place]) 08/20/2017  .  Constipation 08/14/2017  . Metastatic cancer ([place]) 07/16/2017  . Goals of care, counseling/discussion 07/16/2017  . Chronic low back pain 07/04/2017  . Bone metastasis ([place]) 06/23/2017  . COPD with acute exacerbation ([place]) 05/15/2017  . Multiple lung nodules on CT   . Near syncope 05/03/2017  . Lung mass 05/03/2017  . Hyperlipidemia 09/20/2016  . Protein-calorie malnutrition ([place]) 08/17/2016  . Thoracic compression fracture, closed, initial encounter ([place]) 08/17/2016  . Alcohol dependence ([place]) 08/17/2016  . Tobacco dependence 08/17/2016  . Osteoporosis 08/17/2016  . Hip fracture ([place]) 08/16/2016  . Headache 02/24/2016  . Hypertensive urgency 02/03/2016  . Shortness of breath 07/28/2015  . Generalized anxiety disorder 07/28/2015  . Medicare annual wellness visit, subsequent 05/24/2015  . Acute DVT of right tibial vein ([place]) 02/27/2015  . Tibia fracture 02/20/2015  . Syncope 02/18/2015  . Essential hypertension 02/18/2015  . History of stroke 02/18/2015  . Fracture of tibia, right, closed   . C. difficile colitis 09/11/2013  . UTI (urinary tract infection) 09/11/2013  . Essential hypertension, benign 09/11/2013  . Ileus ([place]) 09/10/2013  . 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 Ileus, postoperative ([place]) 09/09/2013  . Hypokalemia 09/09/2013  . Nonspecific (abnormal) findings on radiological and other examination of gastrointestinal tract 09/09/2013  . CVA (cerebral infarction) 09/05/2013  . 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 Malignant neoplasm of prostate ([place]) 08/29/2013  . Hyponatremia 12/11/2012    Class: Chronic  . Ankle fracture 12/10/2012    Palliative Care Assessment & Plan   HPI: 62 y.o. male  with past medical history of recently diagnosed stage IV poorly differentiated spindle cell cancer with bone mets with rib fractures, h/o alcohol abuse, hepatitis, anxiety/depression, DVT, HTN, TIA admitted on 08/20/2017 with  SOB likely r/t  cancer and rib mets/pathological fractures effecting respiratory effort with increasing pain. Palliative care requested for assistance in pain management.   Assessment: I met today with Tanner [name] and family. We discussed pain management and options (including infusion). Decided on regimen as listed below. Large component of anxiety as his discomfort now I believe. They are still very concerned about him becoming oversedated as well so will monitor closely.   We also discussed a plan upon discharge. I was very frank that I believe his pain medication will probably need continued tweaking along the way. I do not believe that he is in a place where returning home is an option given his symptom burden and very low tolerance of any activity/movement. We discussed hospice facility as best option for his care. They have some reservations about hospice and are still very hopeful that he will meet his grandchild due to be born April 14th. I explained that his prognosis remains the same no matter if he is in the hospital, hospice, or home. They agree that hospice facility will be his best option but are still hopeful that his pain can become better managed prior to discharge.   **Late entry: Later this afternoon Tanner [name] seems to have pain and anxiety better controlled and tolerating scheduled morphine and Valium well so far. Discussed with family that is always at bedside as well as nursing that medications should not be help unless he becomes very lethargic.   Dr. Rowe Pavy to follow symptoms over the weekend.   Recommendations/Plan:  Bone mets pain:   Roxanol 25 mg every 4 hours scheduled. Roxanol 15 mg every 2 hours prn moderate pain (in between scheduled doses).   Morphine 2 mg every 2 hours prn severe pain.  Fentanyl patch 37.5 mcg/hr. Consider increasing 2/23 although unsure how much relief he is receiving from fentanyl as this did not seem to improve pain much with last increase.   Scheduled Valium 10 mg  QID.   Complaining more of muscle spasms today: Adding Robaxin 1000 mg po every 6 hours prn. Heat pack prn. He says this didn't help much.   Continue decadron 8 mg po TID for now.   Continue Lyrica 150 po BID.   He is not interested in pursuing any further radiation.   Restarted Prozac 40 mg po daily home dose.   LBM: LBM 2/22. Senokot-S 2 tabs and miralax daily. Dulcolax supp daily prn.    Nystatin suspension ordered for oral thrush. Denies any oral/esophageal pain.    Goals of Care and Additional Recommendations:   Limitations on Scope of Treatment: Focus on comfort  Code Status:  DNR   Prognosis:   Extremely poor with progressing advanced cancer and worsening pain and functional status. With current symptom burden he may likely be eligible for hospice facility.    Discharge Planning:  Will likely need hospice facility care.    Thank you for allowing the Palliative Medicine Team to assist in the care of this patient.   Total Time 60  min Prolonged Time Billed  no      Greater than 50%  of this time was spent counseling and coordinating care related to the above assessment and plan.  Vinie Sill, NP Palliative Medicine Team Pager # [phone] (M-F 8a-5p) Team Phone # [phone] (Nights/Weekends)

## 2017-09-01 NOTE — Progress Notes (Signed)
PMT progress note  Patient is awake alert resting in bed, family including his daughter at bedside.   Patient states that he rested well overnight, he has reasonable pain control. He is losing a lot of hair. His daughter's baby shower is today, he will be included via Skype, he is looking forward to this today. Denies shortness of breath this am, is able to speak in full sentences, is jovial and communicative.   BP 115/83 (BP Location: Left Arm)   Pulse 95   Temp 97.7 F (36.5 C) (Oral)   Resp 18   Ht 5\' 10"  (1.778 m)   Wt 71.8 kg (158 lb 4.6 oz)   SpO2 97%   BMI 22.71 kg/m  Labs imaging and med history including pain medications used noted and discussed with patient and his family.   Awake alert Appears with generalized weakness S1 S2 Regular Has tenderness in abdomen and in extremities on touch Non focal  PPS 30%  A/P: Life limiting illness of stage IV poorly differentiated metastatic spindle cell cancer, possible PNA, recent acute encephalopathy, ongoing pain management needs.  Continue current pain and non pain regimen. No changes today Disposition: is hospice eligible, home with hospice versus residential hospice, patient wishes to focus on his daughter's baby shower today rather than disposition discussions. PMT to continue to follow along.   25 minutes spent [facility] palliative medicine team [phone]

## 2017-09-01 NOTE — Progress Notes (Addendum)
[facility] PROGRESS NOTE  Tanner [name]:[id number] DOB: March 17, 1956 DOA: 08/20/2017  PCP: [name], NP  Brief History/Interval Summary: 62 year old unfortunate male with past medical history of recently diagnosed stage IV cancer of unknown primary presented with shortness of breath.  Also presented with severe pain due to multiple bony lytic lesions.  Hospitalized for pain control.  Reason for Visit: Stage IV cancer  Consultants: Palliative medicine.  Oncology  Procedures: None  Antibiotics: None  Subjective/Interval History: Patient was fast asleep this morning.  Apparently fell asleep at around 5:00.  So I chose not to wake him up.  His ex-wife is at the bedside.  She states that he had a better night yesterday terms of pain.    ROS: Not done today  Objective:  Vital Signs  Vitals:   08/31/17 1404 08/31/17 2005 08/31/17 2114 09/01/17 0533  BP: 119/87  109/79 115/83  Pulse: 93  99 (!) 105  Resp: 16  19 18   Temp: 98.5 F (36.9 C)  98.3 F (36.8 C) 97.7 F (36.5 C)  TempSrc: Oral  Oral Oral  SpO2: 95% 98% 96% 93%  Weight:      Height:        Intake/Output Summary (Last 24 hours) at 09/01/2017 0823 Last data filed at 08/31/2017 2344 Gross per 24 hour  Intake -  Output 450 ml  Net -450 ml   Filed Weights   08/24/17 1556 08/25/17 0406 08/27/17 1831  Weight: 67.2 kg (148 lb 2.4 oz) 68.4 kg (150 lb 12.7 oz) 71.8 kg (158 lb 4.6 oz)    Does not appear to be in any discomfort or distress.    Lab Results:  Data Reviewed: I have personally reviewed following labs and imaging studies  CBC: Recent Labs  Lab 08/25/17 1802 08/26/17 0337 08/27/17 0312 08/27/17 1443 08/28/17 0337  WBC 11.1* 9.9 9.7  --  12.9*  HGB 10.0* 8.7* 8.2* 8.5* 9.1*  HCT 30.3* 26.9* 23.9* 25.2* 27.6*  MCV 98.7 98.9 97.6  --  96.8  PLT 236 209 184  --  440    Basic Metabolic Panel: Recent Labs  Lab 08/25/17 1802 08/26/17 0337 08/27/17 0312 08/28/17 0337  08/29/17 0406 08/30/17 0352 08/31/17 0328  NA 133* 134* 136 132*  --   --   --   K 4.7 4.2 4.1 4.3  --   --   --   CL 92* 99* 100* 94*  --   --   --   CO2 31 27 27 27   --   --   --   GLUCOSE 127* 174* 154* 135*  --   --   --   BUN 29* 27* 23* 21*  --   --   --   CREATININE 0.76 0.70 0.65 0.63 0.66 0.62 0.62  CALCIUM 9.2 8.4* 8.6* 8.5*  --   --   --   MG  --   --  1.8 1.8  --   --   --     GFR: Estimated Creatinine Clearance: 101 mL/min (by C-G formula based on SCr of 0.62 mg/dL).  Liver Function Tests: Recent Labs  Lab 08/28/17 0337  AST 26  ALT 24  ALKPHOS 67  BILITOT 0.5  PROT 5.6*  ALBUMIN 2.1*    Coagulation Profile: Recent Labs  Lab 08/25/17 2250  INR 1.12    Cardiac Enzymes: Recent Labs  Lab 08/28/17 0931  TROPONINI <0.03     Recent Results (from the past 240  hour(s))  Urine culture     Status: None   Collection Time: 08/25/17  7:44 PM  Result Value Ref Range Status   Specimen Description   Final    URINE, CLEAN CATCH Performed at [facility], [place] [address], [place], [place] [address]    Special Requests   Final    NONE Performed at [facility], [place] [address], [place], [place] [address]    Culture   Final    NO GROWTH Performed at [facility], [place] [address], [place], [place] [address]    Report Status 08/27/2017 FINAL  Final  Culture, blood (x 2)     Status: None   Collection Time: 08/25/17  8:18 PM  Result Value Ref Range Status   Specimen Description BLOOD RIGHT ANTECUBITAL  Final   Special Requests IN PEDIATRIC BOTTLE Blood Culture adequate volume  Final   Culture   Final    NO GROWTH 5 DAYS Performed at [facility], [place] [address] [place], [place] [address]    Report Status 08/31/2017 FINAL  Final  Culture, blood (x 2)     Status: None   Collection Time: 08/25/17  8:18 PM  Result Value Ref Range Status   Specimen Description   Final    BLOOD RIGHT HAND Performed at [place] [address], [place], [place] [address]    Special Requests   Final    BOTTLES DRAWN AEROBIC ONLY Blood Culture adequate volume   Culture   Final    NO GROWTH 5 DAYS Performed at [facility], [place] [address], [place], [place] [address]    Report Status 08/31/2017 FINAL  Final  MRSA PCR Screening     Status: None   Collection Time: 08/26/17 10:19 PM  Result Value Ref Range Status   MRSA by PCR NEGATIVE NEGATIVE Final    Comment:        The GeneXpert MRSA Assay (FDA approved for NASAL specimens only), is one component of a comprehensive MRSA colonization surveillance program. It is not intended to diagnose MRSA infection nor to guide or monitor treatment for MRSA infections. Performed at [facility], [place] [address] [place],  [address]       Radiology Studies: No results found.   Medications:  Scheduled: . acetaminophen  1,000 mg Oral Q8H  . dexamethasone  8 mg Oral Q8H  . diazepam  10 mg Oral TID AC & HS  . dronabinol  5 mg Oral TID AC  . enoxaparin (LOVENOX) injection  40 mg Subcutaneous Daily  . feeding supplement (ENSURE ENLIVE)  237 mL Oral TID BM  . fentaNYL  37.5 mcg Transdermal Q72H  . FLUoxetine  40 mg Oral Daily  . guaiFENesin  600 mg Oral BID  . ipratropium-albuterol  3 mL Nebulization BID  . morphine CONCENTRATE  25 mg Oral Q4H  . multivitamin with minerals  1 tablet Oral Daily  . nystatin  5 mL Mouth/Throat QID  . pantoprazole  40 mg Oral Daily  . polyethylene glycol  17 g Oral Daily  . pregabalin  150 mg Oral BID  . PRUTECT  1 application Topical BID  . senna-docusate  2 tablet Oral QHS  . simvastatin  20 mg Oral q1800  . sodium chloride flush  3 mL Intravenous Q12H  . thiamine  100 mg Oral Daily  . traZODone  50 mg Oral QHS   Continuous: .  sodium chloride    . piperacillin-tazobactam (ZOSYN)  IV 3.375 g (09/01/17 6606)   TKZ:SWFUXN chloride, albuterol, bisacodyl, calcium carbonate,  Gerhardt's butt cream, methocarbamol, morphine injection, morphine CONCENTRATE, naLOXone (NARCAN)  injection, ondansetron (ZOFRAN) IV, ondansetron, simethicone, sodium chloride flush  Assessment/Plan:  Principal Problem:   Acute respiratory failure with hypoxia (HCC) Active Problems:   Essential hypertension, benign   Generalized anxiety disorder   Bone metastasis (HCC)   Metastatic cancer (HCC)   Hypoxia   COPD (chronic obstructive pulmonary disease) ([place])   Cancer associated pain   Palliative care encounter   Malnutrition of moderate degree    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  Patient is status post palliative radiotherapy and has been on systemic chemotherapy.  CT scan from 2/19 shows worsening of metastases.  Seen by oncologist who states that his prognosis is very poor. Patient also seen by orthopedics due to lesions in the left femur and pelvis.  No clear indication for surgical intervention at this time.  Patient also on dexamethasone orally.   Palliative medicine was consulted.  Patient is DNR.  Patient's pain was very poorly controlled.  His pain medications were adjusted.  He was started on long-acting and short acting pain medication regimen.  He is also on a bowel regimen.  Lidocaine patch for localized pain.  Pain appears to be better controlled today.  Continue management.  Palliative medicine to provide further input.    Acute respiratory failure with hypoxia with concern for pneumonia On 2/16 patient had to be transferred to ICU for somnolence and increased O2 requirement.  This was thought to be secondary to narcotics.  He was given Narcan with improvement.  Then transferred back to floor.  CT scan done on 2/17 notable for progression in right lower lobe airspace consolidation with atelectasis.  Continue incentive spirometry.  No PE was noted.  Noted to be on Zosyn.  Stop after 7 days.  Last day will be 2/23.  But this  could be stopped earlier if he goes to residential hospice.  Respiratory issues are stable.  Acute encephalopathy Most likely due to narcotics.  Now resolved.    Urinary retention MRI he did not suggest any cauda equina.  Urine retention could be due to patient's immobility.  Continue Foley catheter for comfort purposes.  History of COPD Continue duo nebs as needed.  History of essential hypertension Initially blood pressure was low and so was home medications were held.  Pressures are stable with occasional hypertensive readings which could be due to pain.  Continue to monitor for now.    Hyperlipidemia Continue statins.  History of anemia Hemoglobin is stable.  No evidence for overt bleeding.  Severe protein calorie malnutrition Continue nutritional supplements.  DVT Prophylaxis: Lovenox    Code Status: DNR Family Communication: Discussed with the patient's ex-wife Disposition Plan: Disposition is not entirely clear yet.  Waiting on better pain control.  However it appears that patient may have to go to some kind of facility as it is unlikely that the patient's family will be able to take care of her at home.   LOS: 11 days   [facility] Pager [phone] 09/01/2017, 8:23 AM  If 7PM-7AM, please contact night-coverage at [email or web address], password [facility]

## 2017-09-02 DIAGNOSIS — C799 Secondary malignant neoplasm of unspecified site: Secondary | ICD-10-CM

## 2017-09-02 NOTE — Progress Notes (Signed)
[facility] PROGRESS NOTE  Tanner [name] WUJ:[id number] DOB: [birth year] DOA: 08/20/2017  PCP: [name], NP  Brief History/Interval Summary: 62 year old unfortunate male with past medical history of recently diagnosed stage IV cancer of unknown primary presented with shortness of breath.  Also presented with severe pain due to multiple bony lytic lesions.  Hospitalized for pain control.  Reason for Visit: Stage IV cancer  Consultants: Palliative medicine.  Oncology  Procedures: None  Antibiotics: None  Subjective/Interval History: Patient awake alert today.  Feels better.  Pain is reasonably well controlled.  Has good appetite.  His ex-wife is at the bedside.  Slept well.   Objective:  Vital Signs  Vitals:   09/01/17 2118 09/01/17 2226 09/02/17 0016 09/02/17 0621  BP: 127/85  93/68 117/76  Pulse: (!) 102  86 77  Resp: 19  17 16   Temp: 98.6 F (37 C)  98.4 F (36.9 C) 98.3 F (36.8 C)  TempSrc: Oral  Oral Oral  SpO2: 97% 92% 90% 96%  Weight:      Height:        Intake/Output Summary (Last 24 hours) at 09/02/2017 0927 Last data filed at 09/02/2017 0532 Gross per 24 hour  Intake 570 ml  Output 2725 ml  Net -2155 ml   Filed Weights   08/24/17 1556 08/25/17 0406 08/27/17 1831  Weight: 67.2 kg (148 lb 2.4 oz) 68.4 kg (150 lb 12.7 oz) 71.8 kg (158 lb 4.6 oz)    Awake alert.  In no distress Lungs are clear to auscultation bilaterally anteriorly. S1-S2 is normal regular. Abdomen is soft.  Nontender.  Bowel sounds present. No significant edema. Distracted.  No obvious focal neurological deficits.    Lab Results:  Data Reviewed: I have personally reviewed following labs and imaging studies  CBC: Recent Labs  Lab 08/27/17 0312 08/27/17 1443 08/28/17 0337  WBC 9.7  --  12.9*  HGB 8.2* 8.5* 9.1*  HCT 23.9* 25.2* 27.6*  MCV 97.6  --  96.8  PLT 184  --  956    Basic Metabolic Panel: Recent Labs  Lab 08/27/17 0312 08/28/17 0337  08/29/17 0406 08/30/17 0352 08/31/17 0328  NA 136 132*  --   --   --   K 4.1 4.3  --   --   --   CL 100* 94*  --   --   --   CO2 27 27  --   --   --   GLUCOSE 154* 135*  --   --   --   BUN 23* 21*  --   --   --   CREATININE 0.65 0.63 0.66 0.62 0.62  CALCIUM 8.6* 8.5*  --   --   --   MG 1.8 1.8  --   --   --     GFR: Estimated Creatinine Clearance: 101 mL/min (by C-G formula based on SCr of 0.62 mg/dL).  Liver Function Tests: Recent Labs  Lab 08/28/17 0337  AST 26  ALT 24  ALKPHOS 67  BILITOT 0.5  PROT 5.6*  ALBUMIN 2.1*    Coagulation Profile: No results for input(s): INR, PROTIME in the last 168 hours.  Cardiac Enzymes: Recent Labs  Lab 08/28/17 0931  TROPONINI <0.03     Recent Results (from the past 240 hour(s))  Urine culture     Status: None   Collection Time: 08/25/17  7:44 PM  Result Value Ref Range Status   Specimen Description   Final  URINE, CLEAN CATCH Performed at [facility], [place] [address], [place], [place] [address]    Special Requests   Final    NONE Performed at [facility], [place] [address], [place], [place] [address]    Culture   Final    NO GROWTH Performed at [facility], [place] [address], [place], [place] [address]    Report Status 08/27/2017 FINAL  Final  Culture, blood (x 2)     Status: None   Collection Time: 08/25/17  8:18 PM  Result Value Ref Range Status   Specimen Description BLOOD RIGHT ANTECUBITAL  Final   Special Requests IN PEDIATRIC BOTTLE Blood Culture adequate volume  Final   Culture   Final    NO GROWTH 5 DAYS Performed at [facility], [place] [address] [place], [place] [address]    Report Status 08/31/2017 FINAL  Final  Culture, blood (x 2)     Status: None   Collection Time: 08/25/17  8:18 PM  Result Value Ref Range Status   Specimen Description   Final    BLOOD RIGHT HAND Performed at [place] [address], [place], [place]  [address]    Special Requests   Final    BOTTLES DRAWN AEROBIC ONLY Blood Culture adequate volume   Culture   Final    NO GROWTH 5 DAYS Performed at [facility], [place] [address], [place], [place] [address]    Report Status 08/31/2017 FINAL  Final  MRSA PCR Screening     Status: None   Collection Time: 08/26/17 10:19 PM  Result Value Ref Range Status   MRSA by PCR NEGATIVE NEGATIVE Final    Comment:        The GeneXpert MRSA Assay (FDA approved for NASAL specimens only), is one component of a comprehensive MRSA colonization surveillance program. It is not intended to diagnose MRSA infection nor to guide or monitor treatment for MRSA infections. Performed at [facility], [place] [address], [place], [place] [address]       Radiology Studies: No results found.   Medications:  Scheduled: . acetaminophen  1,000 mg Oral Q8H  . dexamethasone  8 mg Oral Q8H  . diazepam  10 mg Oral TID AC & HS  . dronabinol  5 mg Oral TID AC  . enoxaparin (LOVENOX) injection  40 mg Subcutaneous Daily  . feeding supplement (ENSURE ENLIVE)  237 mL Oral TID BM  . fentaNYL  37.5 mcg Transdermal Q72H  . FLUoxetine  40 mg Oral Daily  . guaiFENesin  600 mg Oral BID  . morphine CONCENTRATE  25 mg Oral Q4H  . multivitamin with minerals  1 tablet Oral Daily  . nystatin  5 mL Mouth/Throat QID  . pantoprazole  40 mg Oral Daily  . polyethylene glycol  17 g Oral Daily  . pregabalin  150 mg Oral BID  . PRUTECT  1 application Topical BID  . senna-docusate  2 tablet Oral QHS  . simvastatin  20 mg Oral q1800  . sodium chloride flush  3 mL Intravenous Q12H  . thiamine  100 mg Oral Daily  . traZODone  50 mg Oral QHS   Continuous: . sodium chloride    . piperacillin-tazobactam (ZOSYN)  IV 3.375 g (09/02/17 0531)   UUV:OZDGUY chloride, albuterol, bisacodyl, calcium carbonate, Gerhardt's butt cream, methocarbamol, morphine injection, morphine CONCENTRATE, naLOXone (NARCAN)  injection,  ondansetron (ZOFRAN) IV, ondansetron, simethicone, sodium chloride  flush  Assessment/Plan:  Principal Problem:   Acute respiratory failure with hypoxia (HCC) Active Problems:   Essential hypertension, benign   Generalized anxiety disorder   Bone metastasis (HCC)   Metastatic cancer (HCC)   Hypoxia   COPD (chronic obstructive pulmonary disease) ([place])   Cancer associated pain   Palliative care encounter   Malnutrition of moderate degree    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  Patient is status post palliative radiotherapy and has been on systemic chemotherapy.  CT scan from 2/19 shows worsening of metastases.  Seen by oncologist who states that his prognosis is very poor. Patient also seen by orthopedics due to lesions in the left femur and pelvis.  No clear indication for surgical intervention.  Patient also on dexamethasone orally.   Palliative medicine was consulted.  Patient is DNR.  Patient's pain was very poorly controlled.  His pain medications were adjusted.  He was started on long-acting and short acting pain medication regimen.  He is on fentanyl patch as well as Roxanol. He is also on a bowel regimen.  He has been having bowel movements.  Lidocaine patch for localized pain.  Over the last 48 hours patient's pain appears to have been better controlled.  Palliative medicine continues to follow.    Acute respiratory failure with hypoxia with concern for pneumonia On 2/16 patient had to be transferred to ICU for somnolence and increased O2 requirement.  This was thought to be secondary to narcotics.  He was given Narcan with improvement.  Then transferred back to floor.  CT scan done on 2/17 notable for progression in right lower lobe airspace consolidation with atelectasis.  Continue incentive spirometry.  No PE was noted.  Patient was started on Zosyn.  He has completed 7 days.  Will be discontinued.  Respiratory issues are  stable.  Acute encephalopathy Most likely due to narcotics.  Now resolved.    Urinary retention MRI he did not suggest any cauda equina.  Urine retention could be due to patient's immobility.  Continue Foley catheter for comfort purposes.  History of COPD Continue duo nebs as needed.  History of essential hypertension Initially blood pressure was low and so was home medications were held.  Blood pressures are stable with occasional hypertensive readings which could be due to pain.  Continue to monitor for now.    Hyperlipidemia Continue statins.  History of anemia Hemoglobin is stable.  No evidence for overt bleeding.  Severe protein calorie malnutrition Continue nutritional supplements.  DVT Prophylaxis: Lovenox    Code Status: DNR Family Communication: Discussed with patient and his ex-wife Disposition Plan: Management as outlined above.  Disposition to be determined by palliative medicine.     LOS: 12 days   [facility] Pager [phone] 09/02/2017, 9:27 AM  If 7PM-7AM, please contact night-coverage at [email or web address], password [facility]

## 2017-09-03 ENCOUNTER — Ambulatory Visit: Payer: Medicare HMO

## 2017-09-03 MED ORDER — OMEPRAZOLE 20 MG PO CPDR
40.0000 mg | DELAYED_RELEASE_CAPSULE | Freq: Two times a day (BID) | ORAL | Status: DC
  Administered 2017-09-03 – 2017-09-04 (×3): 40 mg via ORAL
  Filled 2017-09-03 (×4): qty 2

## 2017-09-03 MED ORDER — GUAIFENESIN 100 MG/5ML PO SOLN
5.0000 mL | ORAL | Status: DC | PRN
  Administered 2017-09-03 (×2): 100 mg via ORAL
  Filled 2017-09-03 (×2): qty 10

## 2017-09-03 NOTE — Progress Notes (Signed)
[facility] PROGRESS NOTE  Tanner [name] HUT:[id number] DOB: 22-Nov-1955 DOA: 08/20/2017  PCP: [name], NP  Brief History/Interval Summary: 62 year old unfortunate male with past medical history of recently diagnosed stage IV cancer of unknown primary presented with shortness of breath.  Also presented with severe pain due to multiple bony lytic lesions.  Hospitalized for pain control.  Palliative medicine was consulted.  Pain control and disposition remain main challenge.  Reason for Visit: Stage IV cancer  Consultants: Palliative medicine.  Oncology  Procedures: None  Antibiotics: None  Subjective/Interval History: Patient states that he had a lot of coughing episodes overnight and as a result could not sleep well.  Pain is reasonably well controlled at 4-5 out of 10 in intensity.     Objective:  Vital Signs  Vitals:   09/02/17 2027 09/02/17 2031 09/02/17 2259 09/03/17 0602  BP:  116/75  128/82  Pulse:  84  98  Resp:  14  12  Temp:  97.9 F (36.6 C)  98 F (36.7 C)  TempSrc:  Oral  Oral  SpO2:  98% 97% 98%  Weight: 69.3 kg (152 lb 12.5 oz)     Height:        Intake/Output Summary (Last 24 hours) at 09/03/2017 0844 Last data filed at 09/03/2017 0447 Gross per 24 hour  Intake 1010 ml  Output 1940 ml  Net -930 ml   Filed Weights   08/25/17 0406 08/27/17 1831 09/02/17 2027  Weight: 68.4 kg (150 lb 12.7 oz) 71.8 kg (158 lb 4.6 oz) 69.3 kg (152 lb 12.5 oz)    Awake alert.  In no distress. Few wheezes heard bilaterally.  Crackles at the bases.  No rhonchi. S1-S2 is normal regular Abdomen soft.  Nontender nondistended.  Bowel sounds are present.  No masses organomegaly No significant edema Remains distracted.  No obvious focal neurological deficits.    Lab Results:  Data Reviewed: I have personally reviewed following labs and imaging studies  CBC: Recent Labs  Lab 08/27/17 1443 08/28/17 0337  WBC  --  12.9*  HGB 8.5* 9.1*  HCT 25.2*  27.6*  MCV  --  96.8  PLT  --  656    Basic Metabolic Panel: Recent Labs  Lab 08/28/17 0337 08/29/17 0406 08/30/17 0352 08/31/17 0328  NA 132*  --   --   --   K 4.3  --   --   --   CL 94*  --   --   --   CO2 27  --   --   --   GLUCOSE 135*  --   --   --   BUN 21*  --   --   --   CREATININE 0.63 0.66 0.62 0.62  CALCIUM 8.5*  --   --   --   MG 1.8  --   --   --     GFR: Estimated Creatinine Clearance: 97.5 mL/min (by C-G formula based on SCr of 0.62 mg/dL).  Liver Function Tests: Recent Labs  Lab 08/28/17 0337  AST 26  ALT 24  ALKPHOS 67  BILITOT 0.5  PROT 5.6*  ALBUMIN 2.1*    Cardiac Enzymes: Recent Labs  Lab 08/28/17 0931  TROPONINI <0.03     Recent Results (from the past 240 hour(s))  Urine culture     Status: None   Collection Time: 08/25/17  7:44 PM  Result Value Ref Range Status   Specimen Description   Final  URINE, CLEAN CATCH Performed at [facility], [place] [address], [place], [place] [address]    Special Requests   Final    NONE Performed at [facility], [place] [address], [place], [place] [address]    Culture   Final    NO GROWTH Performed at [facility], [place] [address] [name], [place] [address]    Report Status 08/27/2017 FINAL  Final  Culture, blood (x 2)     Status: None   Collection Time: 08/25/17  8:18 PM  Result Value Ref Range Status   Specimen Description BLOOD RIGHT ANTECUBITAL  Final   Special Requests IN PEDIATRIC BOTTLE Blood Culture adequate volume  Final   Culture   Final    NO GROWTH 5 DAYS Performed at [facility], [place] [address], [place], [place] [address]    Report Status 08/31/2017 FINAL  Final  Culture, blood (x 2)     Status: None   Collection Time: 08/25/17  8:18 PM  Result Value Ref Range Status   Specimen Description   Final    BLOOD RIGHT HAND Performed at [place] [address], [place], [place] [address]    Special Requests    Final    BOTTLES DRAWN AEROBIC ONLY Blood Culture adequate volume   Culture   Final    NO GROWTH 5 DAYS Performed at [facility], [place] [address] [place], [place] [address]    Report Status 08/31/2017 FINAL  Final  MRSA PCR Screening     Status: None   Collection Time: 08/26/17 10:19 PM  Result Value Ref Range Status   MRSA by PCR NEGATIVE NEGATIVE Final    Comment:        The GeneXpert MRSA Assay (FDA approved for NASAL specimens only), is one component of a comprehensive MRSA colonization surveillance program. It is not intended to diagnose MRSA infection nor to guide or monitor treatment for MRSA infections. Performed at [facility], [place] [address] [place],  [address]       Radiology Studies: No results found.   Medications:  Scheduled: . acetaminophen  1,000 mg Oral Q8H  . dexamethasone  8 mg Oral Q8H  . diazepam  10 mg Oral TID AC & HS  . dronabinol  5 mg Oral TID AC  . enoxaparin (LOVENOX) injection  40 mg Subcutaneous Daily  . feeding supplement (ENSURE ENLIVE)  237 mL Oral TID BM  . fentaNYL  37.5 mcg Transdermal Q72H  . FLUoxetine  40 mg Oral Daily  . guaiFENesin  600 mg Oral BID  . morphine CONCENTRATE  25 mg Oral Q4H  . multivitamin with minerals  1 tablet Oral Daily  . nystatin  5 mL Mouth/Throat QID  . pantoprazole  40 mg Oral Daily  . polyethylene glycol  17 g Oral Daily  . pregabalin  150 mg Oral BID  . PRUTECT  1 application Topical BID  . senna-docusate  2 tablet Oral QHS  . simvastatin  20 mg Oral q1800  . sodium chloride flush  3 mL Intravenous Q12H  . thiamine  100 mg Oral Daily  . traZODone  50 mg Oral QHS   Continuous: . sodium chloride     PJA:SNKNLZ chloride, albuterol, bisacodyl, calcium carbonate, Gerhardt's butt cream, guaiFENesin, methocarbamol, morphine injection, morphine CONCENTRATE, naLOXone (NARCAN)  injection, ondansetron (ZOFRAN) IV, ondansetron, simethicone, sodium chloride  flush  Assessment/Plan:  Principal Problem:   Acute  respiratory failure with hypoxia (HCC) Active Problems:   Essential hypertension, benign   Generalized anxiety disorder   Bone metastasis (HCC)   Metastatic cancer (HCC)   Hypoxia   COPD (chronic obstructive pulmonary disease) ([place])   Cancer associated pain   Palliative care encounter   Malnutrition of moderate degree    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  Patient is status post palliative radiotherapy and has been on systemic chemotherapy.  CT scan from 2/19 shows worsening of metastases.  Seen by oncologist who states that his prognosis is very poor. Patient also seen by orthopedics due to lesions in the left femur and pelvis.  No clear indication for surgical intervention.  Patient also on dexamethasone orally.   Palliative medicine was consulted.  Patient is DNR.  Patient's pain was very poorly controlled.  His pain medications were adjusted.  He was started on long-acting and short acting pain medication regimen.  He is on fentanyl patch as well as Roxanol. He is also on a bowel regimen.  He has been having bowel movements.  Lidocaine patch for localized pain.  Over the last 2-3 days patient's pain has been reasonably well controlled.  Although he is noted to be a bit more fatigued.  palliative medicine continues to follow.  Disposition is not entirely clear.      Acute respiratory failure with hypoxia with concern for pneumonia On 2/16 patient had to be transferred to ICU for somnolence and increased O2 requirement.  This was thought to be secondary to narcotics.  He was given Narcan with improvement.  Then transferred back to floor.  CT scan done on 2/17 notable for progression in right lower lobe airspace consolidation with atelectasis.  Continue incentive spirometry.  No PE was noted.  Patient completed 7 days of Zosyn.  Respiratory issues are stable although he reports more  coughing episodes today.  He is on Mucinex.  He will be given Robitussin as well.  Nebulizer treatments.    Acute encephalopathy Most likely due to narcotics.  Now resolved.    Urinary retention MRI he did not suggest any cauda equina.  Urine retention could be due to patient's immobility.  Continue Foley catheter for comfort purposes.  History of COPD Continue duo nebs as needed.  History of essential hypertension Initially blood pressure was low and so was home medications were held.  Blood pressures are stable with occasional hypertensive readings which could be due to pain.  Continue to monitor for now.    Hyperlipidemia Continue statins.  History of anemia Hemoglobin is stable.  No evidence for overt bleeding.  Severe protein calorie malnutrition Continue nutritional supplements.  DVT Prophylaxis: Lovenox    Code Status: DNR Family Communication: Discussed with patient and his ex-wife Disposition Plan: Management as outlined above.  Disposition to be determined by palliative medicine.     LOS: 13 days   [facility] Pager [phone] 09/03/2017, 8:44 AM  If 7PM-7AM, please contact night-coverage at [email or web address], password [facility]

## 2017-09-03 NOTE — Care Management Important Message (Signed)
Important Message  Patient Details  Name: Tanner [name] MRN: [id number] Date of Birth: 1956/02/01   Medicare Important Message Given:  Yes    Kerin Salen 09/03/2017, 12:12 [place] Message  Patient Details  Name: Tanner [name] MRN: [id number] Date of Birth: 06-09-1956   Medicare Important Message Given:  Yes    Kerin Salen 09/03/2017, 12:12 PM

## 2017-09-03 NOTE — Progress Notes (Addendum)
Daily Progress Note   Patient Name: Tanner [name]       Date: 09/03/2017 DOB: September 28, 1955  Age: 62 y.o. MRN#: [id number] Attending Physician: Bonnielee Haff, MD Primary Care Physician: [name], NP Admit Date: 08/20/2017  Reason for Consultation/Follow-up: Pain control, GOC  Subjective: Tanner [name] is in good spirits today and joking. Ex-wife and daughter at bedside.   Length of Stay: 13  Current Medications: Scheduled Meds:  . acetaminophen  1,000 mg Oral Q8H  . dexamethasone  8 mg Oral Q8H  . diazepam  10 mg Oral TID AC & HS  . dronabinol  5 mg Oral TID AC  . enoxaparin (LOVENOX) injection  40 mg Subcutaneous Daily  . feeding supplement (ENSURE ENLIVE)  237 mL Oral TID BM  . fentaNYL  37.5 mcg Transdermal Q72H  . FLUoxetine  40 mg Oral Daily  . guaiFENesin  600 mg Oral BID  . morphine CONCENTRATE  25 mg Oral Q4H  . multivitamin with minerals  1 tablet Oral Daily  . nystatin  5 mL Mouth/Throat QID  . omeprazole  40 mg Oral BID AC  . polyethylene glycol  17 g Oral Daily  . pregabalin  150 mg Oral BID  . PRUTECT  1 application Topical BID  . senna-docusate  2 tablet Oral QHS  . simvastatin  20 mg Oral q1800  . sodium chloride flush  3 mL Intravenous Q12H  . thiamine  100 mg Oral Daily  . traZODone  50 mg Oral QHS    Continuous Infusions: . sodium chloride      PRN Meds: sodium chloride, albuterol, bisacodyl, calcium carbonate, Gerhardt's butt cream, guaiFENesin, methocarbamol, morphine injection, morphine CONCENTRATE, naLOXone (NARCAN)  injection, ondansetron (ZOFRAN) IV, ondansetron, simethicone, sodium chloride flush  Physical Exam  Constitutional: He is oriented to person, place, and time. He appears well-developed. He appears ill.  HENT:  Head: Normocephalic  and atraumatic.  Cardiovascular: Normal rate and regular rhythm.  Pulmonary/Chest: No accessory muscle usage. No tachypnea. No respiratory distress.  Abdominal: Normal appearance.  Neurological: He is alert and oriented to person, place, and time.  Nursing note and vitals reviewed.           Vital Signs: BP 118/76 (BP Location: Left Arm)   Pulse 86   Temp 97.8 F (36.6 C) (Oral)  Resp 14   Ht 5\' 10"  (1.778 m)   Wt 69.3 kg (152 lb 12.5 oz)   SpO2 97%   BMI 21.92 kg/m  SpO2: SpO2: 97 % O2 Device: O2 Device: Nasal Cannula O2 Flow Rate: O2 Flow Rate (L/min): 2 L/min  Intake/output summary:   Intake/Output Summary (Last 24 hours) at 09/03/2017 1446 Last data filed at 09/03/2017 1019 Gross per 24 hour  Intake 630 ml  Output 1390 ml  Net -760 ml   LBM: Last BM Date: 09/01/17 Baseline Weight: Weight: 68 kg (150 lb) Most recent weight: Weight: 69.3 kg (152 lb 12.5 oz)       Palliative Assessment/Data: 30%    Flowsheet Rows     Most Recent Value  Intake Tab  Referral Department  Hospitalist  Unit at Time of Referral  Med/Surg Unit  Palliative Care Primary Diagnosis  Cancer  Date Notified  08/21/17  Palliative Care Type  New Palliative care  Reason for referral  Pain  Date of Admission  08/20/17  Date first seen by Palliative Care  08/21/17  # of days Palliative referral response time  0 Day(s)  # of days IP prior to Palliative referral  1  Clinical Assessment  Psychosocial & Spiritual Assessment  Palliative Care Outcomes      Patient Active Problem List   Diagnosis Date Noted  . Malnutrition of moderate degree 08/24/2017  . Cancer associated pain   . Palliative care encounter   . Hypoxia 08/20/2017  . Acute respiratory failure with hypoxia ([place]) 08/20/2017  . COPD (chronic obstructive pulmonary disease) ([place]) 08/20/2017  . Constipation 08/14/2017  . Metastatic cancer ([place]) 07/16/2017  . Goals of care, counseling/discussion 07/16/2017  . Chronic low back  pain 07/04/2017  . Bone metastasis ([place]) 06/23/2017  . COPD with acute exacerbation ([place]) 05/15/2017  . Multiple lung nodules on CT   . Near syncope 05/03/2017  . Lung mass 05/03/2017  . Hyperlipidemia 09/20/2016  . Protein-calorie malnutrition ([place]) 08/17/2016  . Thoracic compression fracture, closed, initial encounter ([place]) 08/17/2016  . Alcohol dependence ([place]) 08/17/2016  . Tobacco dependence 08/17/2016  . Osteoporosis 08/17/2016  . Hip fracture ([place]) 08/16/2016  . Headache 02/24/2016  . Hypertensive urgency 02/03/2016  . Shortness of breath 07/28/2015  . Generalized anxiety disorder 07/28/2015  . Medicare annual wellness visit, subsequent 05/24/2015  . Acute DVT of right tibial vein ([place]) 02/27/2015  . Tibia fracture 02/20/2015  . Syncope 02/18/2015  . Essential hypertension 02/18/2015  . History of stroke 02/18/2015  . Fracture of tibia, right, closed   . C. difficile colitis 09/11/2013  . UTI (urinary tract infection) 09/11/2013  . Essential hypertension, benign 09/11/2013  . Ileus ([place]) 09/10/2013  . 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 Ileus, postoperative ([place]) 09/09/2013  . Hypokalemia 09/09/2013  . Nonspecific (abnormal) findings on radiological and other examination of gastrointestinal tract 09/09/2013  . CVA (cerebral infarction) 09/05/2013  . 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 Malignant neoplasm of prostate ([place]) 08/29/2013  . Hyponatremia 12/11/2012    Class: Chronic  . Ankle fracture 12/10/2012    Palliative Care Assessment & Plan   HPI: 62 y.o. 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 Palliative care requested for assistance  in  pain management.   Assessment: Tanner [name] is in good spirits. Pain has been well controlled but has been worse at night presumably from coughing triggering rib pain - prn Robitussin has been ordered. Daughter says he was even able to sit on the side of bed for ~5 minutes yesterday. This is a huge improvement with his ability to tolerate activity whereas even turning slightly in bed was very difficult a few days ago.   We discussed again plan to leave the hospital. They continue to be on board with transition to [facility] there [facility]. Given his severe symptom burden and sudden significant decline I believe that prognosis is very poor although we are still very hopeful he can meet his first and only grandchild due to be born April 7th.   Recommendations/Plan:  Bone mets pain:   Roxanol 25 mg every 4 hours scheduled.   Roxanol 15 mg every 2 hours prn moderate pain.  Fentanyl patch 37.5 mcg/hr.   Scheduled Valium 10 mg QID.   Muscle spasms: Robaxin 1000 mg po every 6 hours prn. Heat pack prn. Continue decadron 8 mg po TID for now.   Continue Lyrica 150 po BID.   He is not interested in pursuing any further radiation.   Restarted Prozac 40 mg po daily home dose.   LBM: LBM 2/22. Senokot-S 2 tabs and miralax daily. Dulcolax supp daily prn.    Nystatin suspension ordered for oral thrush. Denies any oral/esophageal pain.    Goals of Care and Additional Recommendations:   Limitations on Scope of Treatment: Focus on comfort  Code Status:  DNR   Prognosis:   Extremely poor with progressing advanced cancer and worsening pain and functional status. With current symptom burden he may likely be eligible for hospice facility.  < 2-4 weeks  Discharge Planning:  Will likely need hospice facility care.    Thank you for allowing the Palliative Medicine Team to assist in the care of this patient.   Total Time 45 min Prolonged Time Billed  no      Greater than 50%  of this  time was spent counseling and coordinating care related to the above assessment and plan.  Vinie Sill, NP Palliative Medicine Team Pager # [phone] (M-F 8a-5p) Team Phone # [phone] (Nights/Weekends)

## 2017-09-04 ENCOUNTER — Ambulatory Visit: Payer: Medicare HMO

## 2017-09-04 ENCOUNTER — Other Ambulatory Visit: Payer: Self-pay | Admitting: Nurse Practitioner

## 2017-09-04 MED ORDER — POLYETHYLENE GLYCOL 3350 17 G PO PACK
17.0000 g | PACK | Freq: Two times a day (BID) | ORAL | Status: DC
  Administered 2017-09-04: 17 g via ORAL
  Filled 2017-09-04: qty 1

## 2017-09-04 MED ORDER — PREGABALIN 150 MG PO CAPS: 150.0000 mg | ORAL_CAPSULE | Freq: Two times a day (BID) | ORAL | Status: AC

## 2017-09-04 MED ORDER — FLEET ENEMA 7-19 GM/118ML RE ENEM
1.0000 | ENEMA | Freq: Once | RECTAL | Status: AC
  Administered 2017-09-04: 1 via RECTAL
  Filled 2017-09-04: qty 1

## 2017-09-04 MED ORDER — FENTANYL 12 MCG/HR TD PT72: 37.5000 ug | MEDICATED_PATCH | TRANSDERMAL | 0 refills | Status: AC

## 2017-09-04 MED ORDER — NYSTATIN 100000 UNIT/ML MT SUSP: 5.0000 mL | Freq: Four times a day (QID) | OROMUCOSAL | 0 refills | Status: AC

## 2017-09-04 MED ORDER — MORPHINE SULFATE (PF) 2 MG/ML IV SOLN: 2.0000 mg | INTRAVENOUS | 0 refills | Status: AC | PRN

## 2017-09-04 MED ORDER — DIAZEPAM 10 MG PO TABS: 10.0000 mg | ORAL_TABLET | Freq: Three times a day (TID) | ORAL | 0 refills | Status: AC

## 2017-09-04 MED ORDER — POLYETHYLENE GLYCOL 3350 17 G PO PACK: 17.0000 g | PACK | Freq: Two times a day (BID) | ORAL | 0 refills | Status: AC

## 2017-09-04 MED ORDER — DRONABINOL 5 MG PO CAPS: 5.0000 mg | ORAL_CAPSULE | Freq: Three times a day (TID) | ORAL | Status: AC

## 2017-09-04 MED ORDER — ENSURE ENLIVE PO LIQD: 237.0000 mL | Freq: Three times a day (TID) | ORAL | 12 refills | Status: AC

## 2017-09-04 MED ORDER — ONDANSETRON HCL 4 MG/2ML IJ SOLN: 4.0000 mg | Freq: Four times a day (QID) | INTRAMUSCULAR | 0 refills | Status: AC | PRN

## 2017-09-04 MED ORDER — THIAMINE HCL 100 MG PO TABS: 100.0000 mg | ORAL_TABLET | Freq: Every day | ORAL | Status: AC

## 2017-09-04 MED ORDER — GUAIFENESIN ER 600 MG PO TB12: 600.0000 mg | ORAL_TABLET | Freq: Two times a day (BID) | ORAL | Status: AC

## 2017-09-04 MED ORDER — METHOCARBAMOL 500 MG PO TABS: 1000.0000 mg | ORAL_TABLET | Freq: Four times a day (QID) | ORAL | Status: AC | PRN

## 2017-09-04 MED ORDER — ONDANSETRON HCL 8 MG PO TABS: 8.0000 mg | ORAL_TABLET | Freq: Three times a day (TID) | ORAL | 0 refills | Status: AC | PRN

## 2017-09-04 MED ORDER — DEXAMETHASONE 4 MG PO TABS: 8.0000 mg | ORAL_TABLET | Freq: Three times a day (TID) | ORAL | Status: AC

## 2017-09-04 MED ORDER — SENNOSIDES-DOCUSATE SODIUM 8.6-50 MG PO TABS: 2.0000 | ORAL_TABLET | Freq: Every day | ORAL | Status: AC

## 2017-09-04 MED ORDER — MORPHINE SULFATE (CONCENTRATE) 10 MG/0.5ML PO SOLN: 15.0000 mg | ORAL | Status: AC | PRN

## 2017-09-04 MED ORDER — ADULT MULTIVITAMIN W/MINERALS CH: 1.0000 | ORAL_TABLET | Freq: Every day | ORAL | Status: AC

## 2017-09-04 MED ORDER — PRUTECT EX EMUL: 1.0000 "application " | Freq: Two times a day (BID) | CUTANEOUS | Status: AC

## 2017-09-04 MED ORDER — GERHARDT'S BUTT CREAM: 1.0000 "application " | TOPICAL_CREAM | Freq: Three times a day (TID) | CUTANEOUS | Status: AC | PRN

## 2017-09-04 MED ORDER — ACETAMINOPHEN 500 MG PO TABS: 1000.0000 mg | ORAL_TABLET | Freq: Three times a day (TID) | ORAL | 0 refills | Status: AC

## 2017-09-04 MED ORDER — TRAZODONE HCL 50 MG PO TABS: 50.0000 mg | ORAL_TABLET | Freq: Every day | ORAL | Status: AC

## 2017-09-04 MED ORDER — GUAIFENESIN 100 MG/5ML PO SOLN: 5.0000 mL | ORAL | 0 refills | Status: AC | PRN

## 2017-09-04 MED ORDER — BISACODYL 10 MG RE SUPP: 10.0000 mg | Freq: Every day | RECTAL | 0 refills | Status: AC | PRN

## 2017-09-04 MED ORDER — OMEPRAZOLE 40 MG PO CPDR: 40.0000 mg | DELAYED_RELEASE_CAPSULE | Freq: Two times a day (BID) | ORAL | Status: AC

## 2017-09-04 MED ORDER — ALBUTEROL SULFATE (2.5 MG/3ML) 0.083% IN NEBU: 2.5000 mg | INHALATION_SOLUTION | RESPIRATORY_TRACT | 12 refills | Status: AC | PRN

## 2017-09-04 MED ORDER — MORPHINE SULFATE (CONCENTRATE) 10 MG/0.5ML PO SOLN: 25.0000 mg | ORAL | Status: AC

## 2017-09-04 MED ORDER — SIMETHICONE 80 MG PO CHEW: 80.0000 mg | CHEWABLE_TABLET | Freq: Four times a day (QID) | ORAL | 0 refills | Status: AC | PRN

## 2017-09-04 NOTE — Progress Notes (Signed)
Pt discharged with plan to admit to [facility]). Report [phone] Pt and daughter completed admission paperwork with hospice liaison at bedside.  Arranged [facility] transport.   Pt in positive mood today- was laughing and joking appropriately with CSW and family and states he feels confident about transitioning to hospice and that his goal is comfort and pain management and to "meet his grandson."  Sharren Bridge, MSW, [place] [employer] 09/04/2017 [phone]

## 2017-09-04 NOTE — Progress Notes (Signed)
[facility] PROGRESS NOTE  Tanner [name] OHY:[id number] DOB: [birth year] DOA: 08/20/2017  PCP: [name], NP  Brief History/Interval Summary: 62 year old unfortunate male with past medical history of recently diagnosed stage IV cancer of unknown primary presented with shortness of breath.  Also presented with severe pain due to multiple bony lytic lesions.  Hospitalized for pain control.  Palliative medicine was consulted.  Pain control and disposition remain main challenge.  Reason for Visit: Stage IV cancer  Consultants: Palliative medicine.  Oncology  Procedures: None  Antibiotics: None  Subjective/Interval History: Patient states that he rested well last night.  He is noted to be in better spirits this morning.  Cough persists but has improved.  His daughter and ex-wife are at the bedside.      Objective:  Vital Signs  Vitals:   09/03/17 1348 09/03/17 1959 09/03/17 2136 09/04/17 0538  BP: 118/76  (!) 139/91 (!) 129/98  Pulse: 86  82 92  Resp: 14  12 14   Temp: 97.8 F (36.6 C)  97.9 F (36.6 C) 98.3 F (36.8 C)  TempSrc: Oral  Oral Oral  SpO2: 97%  98% 99%  Weight:  70.2 kg (154 lb 12.2 oz)  70.4 kg (155 lb 3.3 oz)  Height:        Intake/Output Summary (Last 24 hours) at 09/04/2017 0902 Last data filed at 09/04/2017 0539 Gross per 24 hour  Intake 200 ml  Output 2350 ml  Net -2150 ml   Filed Weights   09/02/17 2027 09/03/17 1959 09/04/17 0538  Weight: 69.3 kg (152 lb 12.5 oz) 70.2 kg (154 lb 12.2 oz) 70.4 kg (155 lb 3.3 oz)    Awake alert.  In no distress. Few wheezes heard bilaterally.  Crackles at the bases.  No rhonchi. S1-S2 is regular. Abdomen remains soft.  Nontender nondistended   Lab Results:  Data Reviewed: I have personally reviewed following labs and imaging studies  Basic Metabolic Panel: Recent Labs  Lab 08/29/17 0406 08/30/17 0352 08/31/17 0328  CREATININE 0.66 0.62 0.62    GFR: Estimated Creatinine Clearance: 99  mL/min (by C-G formula based on SCr of 0.62 mg/dL).   Cardiac Enzymes: Recent Labs  Lab 08/28/17 0931  TROPONINI <0.03     Recent Results (from the past 240 hour(s))  Urine culture     Status: None   Collection Time: 08/25/17  7:44 PM  Result Value Ref Range Status   Specimen Description   Final    URINE, CLEAN CATCH Performed at [facility], [place] [address], [place], [place] [address]    Special Requests   Final    NONE Performed at [facility], [place] [address] [place], [place] [address]    Culture   Final    NO GROWTH Performed at [facility], [place] [address], [place], [place] [address]    Report Status 08/27/2017 FINAL  Final  Culture, blood (x 2)     Status: None   Collection Time: 08/25/17  8:18 PM  Result Value Ref Range Status   Specimen Description BLOOD RIGHT ANTECUBITAL  Final   Special Requests IN PEDIATRIC BOTTLE Blood Culture adequate volume  Final   Culture   Final    NO GROWTH 5 DAYS Performed at [facility], [place] [address] [place],  [address]    Report Status 08/31/2017 FINAL  Final  Culture, blood (x 2)     Status: None   Collection Time: 08/25/17  8:18 PM  Result Value Ref Range Status   Specimen Description   Final    BLOOD RIGHT HAND Performed at [place] [address] [place], [place] [address]    Special Requests   Final    BOTTLES DRAWN AEROBIC ONLY Blood Culture adequate volume   Culture   Final    NO GROWTH 5 DAYS Performed at [facility], [place] [address] [place], [place] [address]    Report Status 08/31/2017 FINAL  Final  MRSA PCR Screening     Status: None   Collection Time: 08/26/17 10:19 PM  Result Value Ref Range Status   MRSA by PCR NEGATIVE NEGATIVE Final    Comment:        The GeneXpert MRSA Assay (FDA approved for NASAL specimens only), is one component of a comprehensive MRSA colonization surveillance program. It is not intended  to diagnose MRSA infection nor to guide or monitor treatment for MRSA infections. Performed at [facility], [place] [address], [place], [place] [address]       Radiology Studies: No results found.   Medications:  Scheduled: . acetaminophen  1,000 mg Oral Q8H  . dexamethasone  8 mg Oral Q8H  . diazepam  10 mg Oral TID AC & HS  . dronabinol  5 mg Oral TID AC  . enoxaparin (LOVENOX) injection  40 mg Subcutaneous Daily  . feeding supplement (ENSURE ENLIVE)  237 mL Oral TID BM  . fentaNYL  37.5 mcg Transdermal Q72H  . FLUoxetine  40 mg Oral Daily  . guaiFENesin  600 mg Oral BID  . morphine CONCENTRATE  25 mg Oral Q4H  . multivitamin with minerals  1 tablet Oral Daily  . nystatin  5 mL Mouth/Throat QID  . omeprazole  40 mg Oral BID AC  . polyethylene glycol  17 g Oral BID  . pregabalin  150 mg Oral BID  . PRUTECT  1 application Topical BID  . senna-docusate  2 tablet Oral QHS  . simvastatin  20 mg Oral q1800  . sodium chloride flush  3 mL Intravenous Q12H  . sodium phosphate  1 enema Rectal Once  . thiamine  100 mg Oral Daily  . traZODone  50 mg Oral QHS   Continuous: . sodium chloride     JKK:XFGHWE chloride, albuterol, bisacodyl, calcium carbonate, Gerhardt's butt cream, guaiFENesin, methocarbamol, morphine injection, morphine CONCENTRATE, naLOXone (NARCAN)  injection, ondansetron (ZOFRAN) IV, ondansetron, simethicone, sodium chloride flush  Assessment/Plan:  Principal Problem:   Acute respiratory failure with hypoxia (HCC) Active Problems:   Essential hypertension, benign   Generalized anxiety disorder   Bone metastasis (HCC)   Metastatic cancer (HCC)   Hypoxia   COPD (chronic obstructive pulmonary disease) ([place])   Cancer associated pain   Palliative care encounter   Malnutrition of moderate degree    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  Patient is status post palliative radiotherapy and  has been on systemic chemotherapy.  CT scan from 2/19 shows worsening of metastases.  Seen by oncologist who states that his prognosis is very poor. Patient also seen by orthopedics due to lesions in the left femur and pelvis.  No clear indication for surgical intervention.  Patient also on dexamethasone orally.   Palliative medicine was consulted.  Patient is DNR.  Patient's pain was very poorly controlled.  His pain medications were adjusted.  He was started on long-acting and short acting pain medication  regimen.  He is on fentanyl patch as well as Roxanol. He is also on a bowel regimen.  He has been having bowel movements.  Lidocaine patch for localized pain.  Pain has been reasonably well controlled on current regimen.  Palliative medicine has now recommended that he go to residential hospice as it may be very hard to manage his pain and debility at home.  Social worker is [employer] representative.  Acute respiratory failure with hypoxia with concern for pneumonia On 2/16 patient had to be transferred to ICU for somnolence and increased O2 requirement.  This was thought to be secondary to narcotics.  He was given Narcan with improvement.  Then transferred back to floor.  CT scan done on 2/17 notable for progression in right lower lobe airspace consolidation with atelectasis.  He has completed 7 days of Zosyn.  Continue incentive spirometry.  No PE was noted.  Respiratory issues are stable.  Continue Mucinex and Robitussin.  Nebulizer treatments as needed.    Acute encephalopathy Most likely due to narcotics.  Now resolved.    Urinary retention MRI he did not suggest any cauda equina.  Urine retention could be due to patient's immobility.  Continue Foley catheter for comfort purposes.  History of COPD Continue duo nebs as needed.  History of essential hypertension Initially blood pressure was low and so was home medications were held.  Blood pressures are stable with occasional hypertensive readings which  could be due to pain.  Continue to monitor for now.    Hyperlipidemia Continue statins.  History of anemia  Hemoglobin is stable.  No evidence for overt bleeding.  Severe protein calorie malnutrition Continue nutritional supplements.  DVT Prophylaxis: Lovenox    Code Status: DNR Family Communication: Discussed with patient and his ex-wife and his daughter Disposition Plan: Await bed availability at hospice facility.   LOS: 14 days   [facility] Pager [phone] 09/04/2017, 9:02 AM  If 7PM-7AM, please contact night-coverage at [email or web address], password [facility]

## 2017-09-04 NOTE — Consult Note (Signed)
[facility]:   Met with pt and family and extended family and friends. Discussed options of hospice home-home with hospice and family- SNF and hospice because they asked about rehab and options etc. In the end after hours of discussion the family and the pt have come to the plan of moving to [facility]. Went over the consents and the daughter signed for pt at his request. Trinda Pascal will be calling MD to see if he will be in agreement and we are hopeful to move him today. Webb Silversmith RN

## 2017-09-04 NOTE — Discharge Summary (Signed)
[facility]  Physician Discharge Summary   Patient ID: Tanner [name] MRN: [id number] DOB/AGE: [birth year] 62 y.o.  Admit date: 08/20/2017 Discharge date: 09/04/2017  PCP: [name], NP  DISCHARGE DIAGNOSES:  Principal Problem:   Acute respiratory failure with hypoxia (Tanner [name]) Active Problems:   Essential hypertension, benign   Generalized anxiety disorder   Bone metastasis (HCC)   Metastatic cancer (HCC)   Hypoxia   COPD (chronic obstructive pulmonary disease) ([place])   Cancer associated pain   Palliative care encounter   Malnutrition of moderate degree   RECOMMENDATIONS FOR OUTPATIENT FOLLOW UP: 1. Patient to be discharged to residential hospice.  DISCHARGE CONDITION: serious  Diet recommendation: Regular as tolerated  Filed Weights   09/02/17 2027 09/03/17 1959 09/04/17 0538  Weight: 69.3 kg (152 lb 12.5 oz) 70.2 kg (154 lb 12.2 oz) 70.4 kg (155 lb 3.3 oz)    INITIAL HISTORY: 62 year old unfortunate male with past medical history of recently diagnosed stage IV cancer of unknown primary presented with shortness of breath.  Also presented with severe pain due to multiple bony lytic lesions.  Hospitalized for pain control.  Palliative medicine was consulted.  Pain control and disposition remain main challenge.  Consultants: Palliative medicine.  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  Patient is status post palliative radiotherapy and has been on systemic chemotherapy.  CT scan from 2/19 shows worsening of metastases.  Seen by oncologist who states that his prognosis is very poor. Patient also seen by orthopedics due to lesions in the left femur and pelvis.  No clear indication for surgical intervention.  Patient also on dexamethasone orally.   Palliative medicine was consulted.  Patient is DNR.  Patient's pain was very poorly controlled.  His pain medications were adjusted.   He was started on long-acting and short acting pain medication regimen.  He is on fentanyl patch as well as Roxanol. He is also on a bowel regimen.  He has been having bowel movements.  Lidocaine patch for localized pain.  Pain has been reasonably well controlled on current regimen.  Palliative medicine has now recommended that he go to residential hospice as it may be very hard to manage his pain and debility at home.   Acute respiratory failure with hypoxia with concern for pneumonia On 2/16 patient had to be transferred to ICU for somnolence and increased O2 requirement.  This was thought to be secondary to narcotics.  He was given Narcan with improvement.  Then transferred back to floor.  CT scan done on 2/17 notable for progression in right lower lobe airspace consolidation with atelectasis. No PE was noted. He has completed 7 days of Zosyn.  Continue incentive spirometry. Respiratory issues are stable.  Continue Mucinex and Robitussin.  Nebulizer treatments as needed.    Acute encephalopathy Most likely due to narcotics.  Now resolved.    Urinary retention MRI he did not suggest any cauda equina.  Urine retention could be due to patient's immobility.  Continue Foley catheter for comfort purposes.  History of COPD Continue duo nebs as needed.  History of essential hypertension Initially blood pressure was low and so was home medications were held.  Blood pressures are stable with occasional hypertensive readings which could be due to pain.  Continue to monitor for now.    Hyperlipidemia Since comfort care is being pursued, we can stop his statin.  History of anemia  Hemoglobin is stable.  No evidence for overt bleeding.  Severe protein calorie malnutrition Continue nutritional supplements.  Okay for discharge to residential hospice.   PERTINENT LABS:  The results of significant diagnostics from this hospitalization (including imaging, microbiology, ancillary and  laboratory) are listed below for reference.    Microbiology: Recent Results (from the past 240 hour(s))  Urine culture     Status: None   Collection Time: 08/25/17  7:44 PM  Result Value Ref Range Status   Specimen Description   Final    URINE, CLEAN CATCH Performed at [facility], [place] [address], [place], [place] [address]    Special Requests   Final    NONE Performed at [facility], [place] [address] [place], [place] [address]    Culture   Final    NO GROWTH Performed at [facility], [place] [address], [place], [place] [address]    Report Status 08/27/2017 FINAL  Final  Culture, blood (x 2)     Status: None   Collection Time: 08/25/17  8:18 PM  Result Value Ref Range Status   Specimen Description BLOOD RIGHT ANTECUBITAL  Final   Special Requests IN PEDIATRIC BOTTLE Blood Culture adequate volume  Final   Culture   Final    NO GROWTH 5 DAYS Performed at [facility], [place] [address], [place], [place] [address]    Report Status 08/31/2017 FINAL  Final  Culture, blood (x 2)     Status: None   Collection Time: 08/25/17  8:18 PM  Result Value Ref Range Status   Specimen Description   Final    BLOOD RIGHT HAND Performed at [place] [address] [place], [place] [address]    Special Requests   Final    BOTTLES DRAWN AEROBIC ONLY Blood Culture adequate volume   Culture   Final    NO GROWTH 5 DAYS Performed at [facility], [place] [address] [place], [place] [address]    Report Status 08/31/2017 FINAL  Final  MRSA PCR Screening     Status: None   Collection Time: 08/26/17 10:19 PM  Result Value Ref Range Status   MRSA by PCR NEGATIVE NEGATIVE Final    Comment:        The GeneXpert MRSA Assay (FDA approved for NASAL specimens only), is one component of a comprehensive MRSA colonization surveillance program. It is not intended to diagnose MRSA infection nor to guide or monitor treatment  for MRSA infections. Performed at [facility], [place] [address], [place],  [address]      Labs: Basic Metabolic Panel: Recent Labs  Lab 08/29/17 0406 08/30/17 0352 08/31/17 0328  CREATININE 0.66 0.62 0.62   BNP: BNP (last 3 results) Recent Labs    08/20/17 1851 08/25/17 1802  BNP 24.9 33.4     IMAGING STUDIES Dg Chest 2 View  Result Date: 08/20/2017 CLINICAL DATA:  Dyspnea EXAM: CHEST  2 VIEW COMPARISON:  05/02/2017 chest radiograph. FINDINGS: Stable cardiomediastinal silhouette with normal heart size. No pneumothorax. No pleural effusion. New patchy left lower lobe lung opacity. No pulmonary edema. Stable chronic mid thoracic vertebral compression fracture with associated kyphotic angulation. IMPRESSION: New patchy left lower lobe lung opacity, which may represent a pneumonia in the correct clinical setting. Recommend attention on short-term follow-up post treatment chest imaging. Electronically Signed   By: Ilona Sorrel M.D.   On: 08/20/2017 19:42   Dg Shoulder Right  Result Date: 08/22/2017  CLINICAL DATA:  Chronic shoulder pain EXAM: RIGHT SHOULDER - 2+ VIEW COMPARISON:  None. FINDINGS: There is no evidence of fracture or dislocation. There is generalized osteopenia. There is no evidence of arthropathy or other focal bone abnormality. Soft tissues are unremarkable. IMPRESSION: No acute osseous injury of the right shoulder. Electronically Signed   By: Kathreen Devoid   On: 08/22/2017 10:11   Ct Angio Chest Pe W Or Wo Contrast  Result Date: 08/26/2017 CLINICAL DATA:  Dyspnea. Chronic. Stage IV cancer of unknown primary. EXAM: CT ANGIOGRAPHY CHEST WITH CONTRAST TECHNIQUE: Multidetector CT imaging of the chest was performed using the standard protocol during bolus administration of intravenous contrast. Multiplanar CT image reconstructions and MIPs were obtained to evaluate the vascular anatomy. CONTRAST:  157mL ISOVUE-370 IOPAMIDOL (ISOVUE-370) INJECTION 76%  COMPARISON:  CT chest 08/20/2017 and CT abdomen and pelvis from 05/04/2017 and PET-CT from 05/11/2017 FINDINGS: Cardiovascular: The heart size appears normal. No pericardial effusion. Aortic atherosclerosis. Calcification in the left main coronary artery and LAD identified. The main pulmonary artery is patent. No lobar or central obstructing pulmonary emboli identified. Respiratory motion artifact diminishes exam detail within the lower lobe pulmonary arteries. No lobar or segmental pulmonary artery filling defects identified to suggest a clinically significant acute pulmonary embolus. Mediastinum/Nodes: The thyroid gland appears normal. The trachea is patent and midline. Normal appearance of the esophagus. The enlarged prevascular lymph node measures 2.7 x 1.7 cm, image 92 of series 3. Unchanged from previous exam. No additional mediastinal or hilar adenopathy. No axillary or supraclavicular adenopathy. Lungs/Pleura: Small left pleural effusion is similar to previous exam. Significant worsening aeration to the right lower lobe secondary to airspace consolidation and atelectasis. Left upper lobe paramediastinal subpleural mass measures 3.2 by 2.2 cm, image 34 of series 2. Previously this measured the same. Scattered ground-glass and tree-in-bud nodularity identified within both lungs. Upper Abdomen: Progressive liver metastasis identified compared with CT from 05/04/2017. Musculoskeletal: Extensive multifocal lytic bone metastases are identified. Large left posterolateral eighth rib expansile lesion measures 7.4 by 3.4 cm, image 182 of series 3. Previously 6.3 by 3.2 cm. Expansile right lateral eighth rib lesion measures 5.6 by 2.4 cm, image 212 of series 3. Previously 3.4 x 2.5 cm. Numerous lytic bone lesions are identified throughout the thoracic spine. Using the same numbering scheme as the study from 08/20/2017 pathologic fractures involving T7, T8 and T9 vertebra are again identified. Review of the MIP images  confirms the above findings. IMPRESSION: 1. No evidence for acute pulmonary embolus. 2. Interval progression of right lower lobe airspace consolidation in atelectasis. This may in part be contributing to the patient's difficulty with breathing. 3. Bilateral expansile rib lesions have progressed from previous exam. 4. Extensive thoracic spine lytic bone metastasis with multi level pathologic fractures. 5. Similar appearance of enlarged prevascular mediastinal lymph node and left upper lobe subpleural mass. Electronically Signed   By: Kerby Moors M.D.   On: 08/26/2017 17:54   Ct Angio Chest Pe W And/or Wo Contrast  Result Date: 08/20/2017 CLINICAL DATA:  Dyspnea and diffuse pain. Patient is on chemotherapy for bone and lung cancer. EXAM: CT ANGIOGRAPHY CHEST WITH CONTRAST TECHNIQUE: Multidetector CT imaging of the chest was performed using the standard protocol during bolus administration of intravenous contrast. Multiplanar CT image reconstructions and MIPs were obtained to evaluate the vascular anatomy. CONTRAST:  80mL ISOVUE-370 IOPAMIDOL (ISOVUE-370) INJECTION 76% COMPARISON:  PET-CT 05/11/2017, chest CT 05/03/2017. FINDINGS: Cardiovascular: Heart is normal in size. No pericardial effusion is identified. There  is coronary arteriosclerosis along the left main and LAD. No acute pulmonary embolus. Satisfactory opacification of the pulmonary arteries. There is minimal aortic atherosclerosis. No aneurysm or dissection. Mediastinum/Nodes: Interval increase in prevascular lymph node measuring 2.8 x 1.7 cm, series 4, image 27 versus 1.1 x 0.5 cm on prior chest CT. Trachea and mainstem bronchi appear patent. The thoracic esophagus is unremarkable. No thyromegaly or mass. No supraclavicular, axillary nor hilar lymphadenopathy. Lungs/Pleura: Interval increase in size of previously noted medial left upper lobe mass abutting the superior mediastinum, currently estimated at 3.2 x 2.2 x 2.1 cm previously estimated at 2.2  x 2.1 x 1.4 cm. No additional pulmonary masses. There is atelectasis along the posterior aspect of both lower lobes. Upper Abdomen: Inhomogeneous appearance of the included right hepatic lobe concerning for metastasis. Included left adrenal gland is unremarkable. The right adrenal gland is excluded. Musculoskeletal: The following osteolytic metastatic foci and/or fractures are noted: 1. Left glenoid neck. 2. Right lateral third and sixth rib fractures likely pathologic. Left lateral sixth rib fracture. 3. Bony expansile 3.7 x 2.8 cm right lateral fifth rib mass, bony expansile lytic 3.3 x 2.5 cm right posterolateral seventh rib and 1.5 x 2.4 cm right lateral eighth rib lytic masses. 4. 6.8 x 3.4 cm bony expansile lytic mass of the posterior left eighth rib likely accounting for the opacity seen radiographically, expansile lytic 3.8 x 2 cm left posterolateral ninth rib mass. 5. Redemonstration of lower thoracic kyphosis with multilevel lytic lesions of the thoracic spine namely T2 through T5, T7, T9 and T10. Review of the MIP images confirms the above findings. IMPRESSION: 1. The left lower lobe opacity seen on same day chest radiograph is not attributable to a pneumonia but due to a bony expansile lytic lesion involving the posterior left eighth rib measuring 6.8 x 3.4 cm. Additional bony expansile lytic metastasis involving numerous bilateral ribs as above described with osteolytic lesions of the thoracic spine are in keeping with metastasis. Additional osteolytic abnormalities involving the left glenoid neck with pathologic bilateral rib fractures. 2. Ill-defined partially included hypodensities in the right hepatic lobe are concerning for hepatic metastasis, also new since prior. 3. Interval increase in left upper lobe paramediastinal mass now measuring 3.2 x 2.2 x 2.1 cm versus 2.2 x 2.1 x 1.4 cm previously. Interval increase size of a prevascular lymph node the abutting the aortic arch now measuring 2.8 x 1.7  cm versus 1.1 x 0.5 cm on prior chest CT. 4. No acute pulmonary embolus. 5. Coronary arteriosclerosis. Electronically Signed   By: Ashley Royalty M.D.   On: 08/20/2017 22:07   Mr Lumbar Spine Wo Contrast  Result Date: 08/27/2017 CLINICAL DATA:  62 year old male with recently diagnosed stage IV portion poorly differentiated spindle cell carcinoma. Severe pain. EXAM: MRI LUMBAR SPINE WITHOUT CONTRAST TECHNIQUE: Multiplanar, multisequence MR imaging of the lumbar spine was performed. No intravenous contrast was administered. COMPARISON:  CT Abdomen and Pelvis 08/26/2017.  PET-CT 05/11/2017 FINDINGS: The examination had to be discontinued prior to completion due to severe pain. Only sagittal T1 weighted imaging was obtained. Normal lumbar segmentation demonstrated on the recent CT Abdomen and Pelvis. Malignant appearing decreased T1 bone marrow signal is scattered throughout the visible spinal vertebrae from the T12 to the S2 level. These correspond to predominately lytic osseous lesions on the recent CT. Stable vertebral height and alignment to the CT yesterday. There is early lumbar pedicle involvement on the left at L3 and on the right at L4. There is  left lamina involvement at L1. There is no malignant spinal or neural foraminal stenosis identified. Stable visible abdominal viscera. IMPRESSION: 1. The examination had to be discontinued prior to completion due to severe pain. Only sagittal T1 weighted imaging was obtained. 2. Stable lower thoracic and lumbar osseous metastatic disease compared to the CT yesterday. No malignant lumbar spinal or neural impingement identified. Electronically Signed   By: Genevie Ann M.D.   On: 08/27/2017 11:51   Ct Abdomen Pelvis W Contrast  Result Date: 08/26/2017 CLINICAL DATA:  Stage IV cancer of unknown primary.  Abdominal pain. EXAM: CT ABDOMEN AND PELVIS WITH CONTRAST TECHNIQUE: Multidetector CT imaging of the abdomen and pelvis was performed using the standard protocol following  bolus administration of intravenous contrast. CONTRAST:  157mL ISOVUE-370 IOPAMIDOL (ISOVUE-370) INJECTION 76% COMPARISON:  05/04/17. FINDINGS: Lower chest: Dense airspace consolidation within the posterior and medial right lung base identified. Small left pleural effusion identified. Hepatobiliary: Progressive multifocal liver metastases identified. Index lesion within the posterior right lobe of liver measures 3.2 cm, image 19 of series 4. New from previous exam. Inferior right lobe of liver metastasis is also new measuring 2.4 cm, image 35 of series 4. Anterior right lobe of liver metastasis measures 2.0 cm, image 29 of series 4. New from previous exam. Gallbladder appears normal. No biliary dilatation. Pancreas: Normal appearance of the pancreas. Spleen: The spleen is unremarkable. Adrenals/Urinary Tract: Normal appearance of both the adrenal glands. Bilateral kidney cysts are again noted. No mass or hydronephrosis visualized. Urinary bladder appears normal. Stomach/Bowel: The stomach is normal. The small bowel loops have a normal course and caliber without evidence for bowel obstruction. No pathologic dilatation of the colon. Vascular/Lymphatic: Aortic atherosclerosis. No aneurysm. No abdominal adenopathy. No pelvic or inguinal adenopathy. Reproductive: Previous prostatectomy. Other: Small supraumbilical ventral abdominal wall hernia contains fat only. Trace free fluid noted within the pelvis. Musculoskeletal: Multifocal lytic bone metastases are identified. Progressed from previous exam. Bilateral expansile rib lesions are identified, new from previous exam. Left posterolateral ninth rib lesion measures 3.6 x 2.0 cm, image 15 of series 4. Left posterior eighth rib expansile lesion measures 7 x 4.1 cm. Extensive lytic bone metastases are noted throughout the visualized portions of the thoracic and lumbar spine. Increased from prior exam. L2 lytic lesion with pathologic compression fracture measures 1.7 cm,  image 68 of series 8. New from previous exam. Large lytic lesion within the T8 vertebra with associated superior endplate pathologic fracture is new measuring 1.9 cm, image number 68 of series 8. S1 lytic lesion measures 2.2 cm, image 69 of series 8. The previously 0.8 cm. Enlarging lytic lesion involving the posterior cortex of the proximal left femur may be of orthopedic significance measuring 2.5 cm, image 90 of series 4. Previously 0.8 cm. Also of potential orthopedic significance are bilateral inferior pubic rami lytic lesions. IMPRESSION: 1. Interval development of multifocal liver metastasis. 2. Significant interval progression of multifocal lytic bone metastases involving the spine, bony pelvis and left femur. Associated pathologic fractures are noted within the lumbar spine. Lesions involving the left femur and pelvis may be of orthopedic significance. Electronically Signed   By: Kerby Moors M.D.   On: 08/26/2017 17:35   Dg Chest Port 1 View  Result Date: 08/25/2017 CLINICAL DATA:  Right-sided chest pain with deep breathing. Shortness of breath and cough, recently diagnosed with stage IV cancer. History of hypertension. EXAM: PORTABLE CHEST 1 VIEW COMPARISON:  Chest x-ray from earlier same day and chest x-ray dated 08/20/2017.  Chest CT dated 08/20/2017. FINDINGS: Cardiomediastinal silhouette is stable in size and configuration. Again noted are the known chest wall metastases bilaterally. No new lung findings to suggest a developing pneumonia. No pleural effusion or pneumothorax seen. IMPRESSION: Stable chest x-ray. No evidence of pneumonia or pulmonary edema. Known chest wall metastases again noted bilaterally. Electronically Signed   By: Franki Cabot M.D.   On: 08/25/2017 18:05   Dg Chest Port 1 View  Result Date: 08/25/2017 CLINICAL DATA:  Shortness of breath and hypoxia. Metastatic bone disease. EXAM: PORTABLE CHEST 1 VIEW COMPARISON:  08/20/2017 and CT 08/20/2017 FINDINGS: Lungs are  adequately inflated without focal airspace consolidation or effusion. Evidence of patient's known metastatic lesions over the right lateral thorax and posterolateral right chest wall. Cardiomediastinal silhouette and remainder of the exam is unchanged. IMPRESSION: No acute cardiopulmonary disease. Known chest wall metastatic disease. Electronically Signed   By: Marin Olp M.D.   On: 08/25/2017 13:52   Dg Hip Unilat With Pelvis 2-3 Views Left  Result Date: 08/27/2017 CLINICAL DATA:  Left hip pain without known injury. EXAM: DG HIP (WITH OR WITHOUT PELVIS) 2-3V LEFT COMPARISON:  None. FINDINGS: There is no evidence of hip fracture or dislocation. There is no evidence of arthropathy or other focal bone abnormality. IMPRESSION: Normal left hip. Electronically Signed   By: Marijo Conception, M.D.   On: 08/27/2017 12:10    DISCHARGE EXAMINATION: See progress note from earlier today.  DISPOSITION: Residential hospice    Allergies as of 09/04/2017      Reactions   Dilaudid [hydromorphone Hcl] Nausea And Vomiting   Pre pt history   Procaine Hcl Other (See Comments)   08/10/2017: The patient indicated that Novocaine was used by a dermatologist prior to obtaining biopsy on his back "many years ago". Patient experienced a syncopal reaction by his report. The patient has since had dental anesthetic without any adverse reactions.   Sulfa Antibiotics Rash      Medication List    STOP taking these medications   amLODipine 10 MG tablet Commonly known as:  NORVASC   docusate sodium 100 MG capsule Commonly known as:  COLACE   emollient cream Commonly known as:  BIAFINE Replaced by:  PRUTECT Emul   GOODY BODY PAIN 500-325 MG Pack Generic drug:  Aspirin-Acetaminophen   lisinopril 40 MG tablet Commonly known as:  PRINIVIL,ZESTRIL   loperamide 2 MG capsule Commonly known as:  IMODIUM   metoprolol tartrate 100 MG tablet Commonly known as:  LOPRESSOR   MIRALAX powder Generic drug:  polyethylene  glycol powder Replaced by:  polyethylene glycol packet   morphine 30 MG 12 hr tablet Commonly known as:  MS CONTIN Replaced by:  morphine CONCENTRATE 10 MG/0.5ML Soln concentrated solution   oxyCODONE 5 MG immediate release tablet Commonly known as:  Oxy IR/ROXICODONE   simvastatin 20 MG tablet Commonly known as:  ZOCOR     TAKE these medications   acetaminophen 500 MG tablet Commonly known as:  TYLENOL Take 2 tablets (1,000 mg total) by mouth every 8 (eight) hours.   albuterol (2.5 MG/3ML) 0.083% nebulizer solution Commonly known as:  PROVENTIL Take 3 mLs (2.5 mg total) by nebulization every 2 (two) hours as needed for wheezing.   bisacodyl 10 MG suppository Commonly known as:  DULCOLAX Place 1 suppository (10 mg total) rectally daily as needed for moderate constipation.   dexamethasone 4 MG tablet Commonly known as:  DECADRON Take 2 tablets (8 mg total) by mouth every 8 (  eight) hours.   diazepam 10 MG tablet Commonly known as:  VALIUM Take 1 tablet (10 mg total) by mouth 4 (four) times daily -  before meals and at bedtime. What changed:    medication strength  how much to take  when to take this  reasons to take this   dronabinol 5 MG capsule Commonly known as:  MARINOL Take 1 capsule (5 mg total) by mouth 3 (three) times daily before meals.   feeding supplement (ENSURE ENLIVE) Liqd Take 237 mLs by mouth 3 (three) times daily between meals.   fentaNYL 12 MCG/HR Commonly known as:  DURAGESIC - dosed mcg/hr Place 3 patches (37.5 mcg total) onto the skin every 3 (three) days. Start taking on:  09/05/2017   FLUoxetine 40 MG capsule Commonly known as:  PROZAC TAKE 1 CAPSULE BY MOUTH EVERY DAY   Gerhardt's butt cream Crea Apply 1 application topically 3 (three) times daily as needed for irritation.   guaiFENesin 600 MG 12 hr tablet Commonly known as:  MUCINEX Take 1 tablet (600 mg total) by mouth 2 (two) times daily.   guaiFENesin 100 MG/5ML  Soln Commonly known as:  ROBITUSSIN Take 5 mLs (100 mg total) by mouth every 4 (four) hours as needed for cough or to loosen phlegm.   methocarbamol 500 MG tablet Commonly known as:  ROBAXIN Take 2 tablets (1,000 mg total) by mouth every 6 (six) hours as needed for muscle spasms.   morphine CONCENTRATE 10 MG/0.5ML Soln concentrated solution Take 0.75 mLs (15 mg total) by mouth every 2 (two) hours as needed for moderate pain or shortness of breath. Replaces:  morphine 30 MG 12 hr tablet   morphine CONCENTRATE 10 MG/0.5ML Soln concentrated solution Take 1.25 mLs (25 mg total) by mouth every 4 (four) hours.   morphine 2 MG/ML injection Inject 1 mL (2 mg total) into the vein every 2 (two) hours as needed (SOB).   multivitamin with minerals Tabs tablet Take 1 tablet by mouth daily. Start taking on:  09/05/2017   nystatin 100000 UNIT/ML suspension Commonly known as:  MYCOSTATIN Use as directed 5 mLs (500,000 Units total) in the mouth or throat 4 (four) times daily.   omeprazole 40 MG capsule Commonly known as:  PRILOSEC Take 1 capsule (40 mg total) by mouth 2 (two) times daily before a meal. What changed:  when to take this   ondansetron 8 MG tablet Commonly known as:  ZOFRAN Take 1 tablet (8 mg total) by mouth every 8 (eight) hours as needed for nausea or vomiting. What changed:  Another medication with the same name was added. Make sure you understand how and when to take each.   ondansetron 4 MG/2ML Soln injection Commonly known as:  ZOFRAN Inject 2 mLs (4 mg total) into the vein every 6 (six) hours as needed for nausea or vomiting. What changed:  You were already taking a medication with the same name, and this prescription was added. Make sure you understand how and when to take each.   polyethylene glycol packet Commonly known as:  MIRALAX / GLYCOLAX Take 17 g by mouth 2 (two) times daily. Replaces:  MIRALAX powder   pregabalin 150 MG capsule Commonly known as:   LYRICA Take 1 capsule (150 mg total) by mouth 2 (two) times daily.   PRUTECT Emul Apply 1 application topically 2 (two) times daily. Replaces:  emollient cream   senna-docusate 8.6-50 MG tablet Commonly known as:  Senokot-S Take 2 tablets by mouth at  bedtime.   simethicone 80 MG chewable tablet Commonly known as:  MYLICON Chew 1 tablet (80 mg total) by mouth 4 (four) times daily as needed for flatulence.   thiamine 100 MG tablet Take 1 tablet (100 mg total) by mouth daily. Start taking on:  09/05/2017   traZODone 50 MG tablet Commonly known as:  DESYREL Take 1 tablet (50 mg total) by mouth at bedtime. What changed:    when to take this  reasons to take this  additional instructions       TOTAL DISCHARGE TIME: 35 minutes  [facility] Pager [phone]  09/04/2017, 3:24 PM

## 2017-09-04 NOTE — Progress Notes (Addendum)
Met with pt, daughter, and ex wife this morning. They have discussed prognosis and pt has decided to pursue full comfort care and residential hospice deemed appropriate. Prefers [facility] due to proximity to family. May be open to other hospice homes if [facility] does not have availability but did not want to discuss specifics yet. Made referral and will follow up.   Sharren Bridge, MSW, LCSW Clinical Social Work 09/04/2017 [phone]  10:41- informed that [facility] does not have current availability. Discussed with family- decided to move forward with other options. Prefer [facility] made referral.

## 2017-09-04 NOTE — Progress Notes (Signed)
Palliative:  Met today with Tanner [name], ex-wife, and daughter. They had a much more restful night last night. Ex-wife reports the best night since he has been hospitalized. No changes to pain regimen at this time.   Tanner [name] was asking about more scans or PET scan. I explored why he would want these tests and he says just to see how quickly his cancer is progressing from multiple scans obtained within the past 2 weeks. I explained that we know his cancer is progressing quickly but even if there was no change on the scan this would not be useful information to use because he is no longer a candidate for treatment for his cancer anyway. I also discouraged as the pain of obtaining these tests would worsen his pain so the benefits outweigh the risks.   We further discussed [place] and philosophy of hospice care would not involve any further testing, labs, but rather a focus on keeping him comfortable for the time he has left. They all understand and continue to agree this is his best option. This is difficult as he continues to be hopeful to live to meet his grandchild due April 7th (but understands this may not be possible). The better his pain is controlled and the better he feels the more hopeful he is to live long enough to meet grandchild. They will await to speak with hospice representative. All questions/concerns addressed. Emotional support provided.   Prognosis is poor and likely weeks but could possible be greater than 2 weeks. Symptom burden is extreme and will be a very limiting factor for him. Would benefit from hospice care given goals for comfort and extreme need for continued symptom management at EOL.   81 min   Vinie Sill, NP Palliative Medicine Team Pager # [phone] (M-F 8a-5p) Team Phone # [phone] (Nights/Weekends)

## 2017-09-05 ENCOUNTER — Telehealth: Payer: Self-pay | Admitting: Medical Oncology

## 2017-09-05 ENCOUNTER — Ambulatory Visit: Admit: 2017-09-05 | Payer: Medicare HMO

## 2017-09-05 ENCOUNTER — Telehealth: Payer: Self-pay | Admitting: *Deleted

## 2017-09-05 NOTE — Telephone Encounter (Deleted)
Per Tanner [name] he is not going to refill Ativan due to pt on narcotics. Pt voiced understanding.

## 2017-09-05 NOTE — Telephone Encounter (Signed)
Pt was on TCM report admitted 08/31/17 for Acute respiratory failure with hypoxia.  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 Also presented with severe pain due to multiple bony lytic lesions. Hospitalized for pain control. Palliative medicine has now recommended residential hospice as it may be very hard to manage his pain and debility at home. Pt D/c 09/04/17 to residential hospice for comfort care.Marland KitchenJohny [name]

## 2017-09-05 NOTE — Telephone Encounter (Signed)
Error - wrong pt

## 2017-09-06 ENCOUNTER — Ambulatory Visit: Payer: Medicare HMO

## 2017-09-07 ENCOUNTER — Ambulatory Visit: Payer: Medicare HMO

## 2017-09-10 ENCOUNTER — Ambulatory Visit: Payer: Medicare HMO

## 2017-09-11 ENCOUNTER — Ambulatory Visit: Payer: Medicare HMO

## 2017-09-12 ENCOUNTER — Ambulatory Visit: Payer: Medicare HMO

## 2017-09-14 ENCOUNTER — Telehealth: Payer: Self-pay | Admitting: Nurse Practitioner

## 2017-09-14 NOTE — Telephone Encounter (Signed)
Drucie Ip, RN from Hospice called to advise patient has passed away peacefully at home.

## 2017-09-20 ENCOUNTER — Other Ambulatory Visit: Payer: Self-pay | Admitting: Nurse Practitioner

## 2017-10-08 ENCOUNTER — Other Ambulatory Visit: Payer: Self-pay | Admitting: Nurse Practitioner

## 2017-10-08 DEATH — deceased

## 2017-10-16 ENCOUNTER — Other Ambulatory Visit: Payer: Self-pay | Admitting: Nurse Practitioner

## 2017-12-07 ENCOUNTER — Other Ambulatory Visit: Payer: Self-pay | Admitting: Family

## 2017-12-07 ENCOUNTER — Other Ambulatory Visit: Payer: Self-pay | Admitting: Nurse Practitioner

## 2017-12-07 DIAGNOSIS — I10 Essential (primary) hypertension: Secondary | ICD-10-CM

## 2018-01-02 ENCOUNTER — Ambulatory Visit: Payer: Self-pay | Admitting: Nurse Practitioner

## 2018-06-13 LAB — BLOOD GAS, VENOUS
ACID-BASE EXCESS: 8 mmol/L — AB (ref 0.0–2.0)
BICARBONATE: 33.4 mmol/L — AB (ref 20.0–28.0)
O2 SAT: 86.1 %
Patient temperature: 98.6
pCO2, Ven: 51.5 mmHg (ref 44.0–60.0)
pH, Ven: 7.428 (ref 7.250–7.430)
pO2, Ven: 55.8 mmHg — ABNORMAL HIGH (ref 32.0–45.0)

## 2019-05-01 IMAGING — PT NM PET TUM IMG INITIAL (PI) SKULL BASE T - THIGH
8 series · 25 of 25 positions shown · non-contrast
Comparison: Chest CT 05/03/2017

CLINICAL DATA: Initial treatment strategy for left upper lobe lung
lesion.

EXAM:
NUCLEAR MEDICINE PET SKULL BASE TO THIGH
TECHNIQUE: 8.1 mCi F-18 FDG was injected intravenously. Full-ring PET imaging
was performed from the skull base to thigh after the radiotracer. CT
data was obtained and used for attenuation correction and anatomic
localization.
FASTING BLOOD GLUCOSE:  Value: 95 mg/dl

[Series 3: pet sk_thigh ac · axial · 5.0mm · 4.07mm/px · z∈[-1278,-398]mm · 4 of 221 slices shown]
[im 1/221]
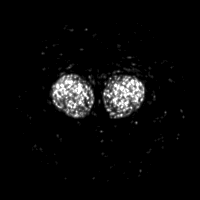
[im 74/221]
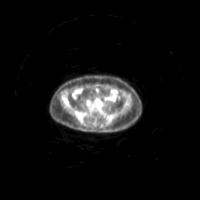
[im 147/221]
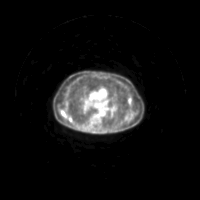
[im 221/221]
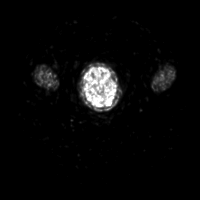

[Series 4: ct sk_thigh 5.0 b31f · axial · 5.0mm · 0.98mm/px · z∈[-1278,-398]mm · 5 of 221 slices shown]
[im 1/221]
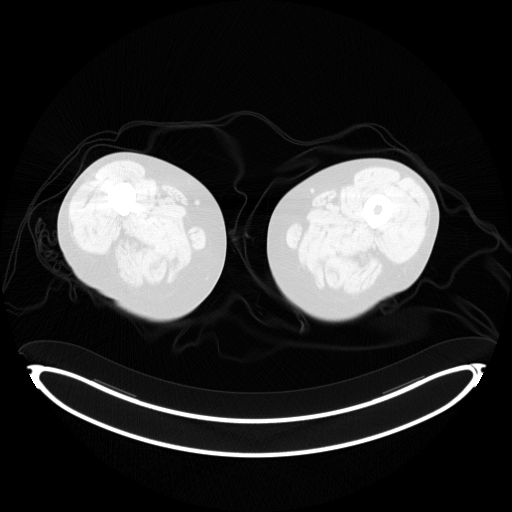
[im 56/221]
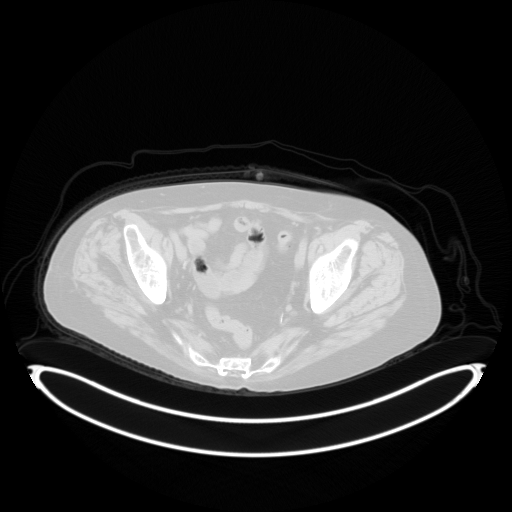
[im 111/221]
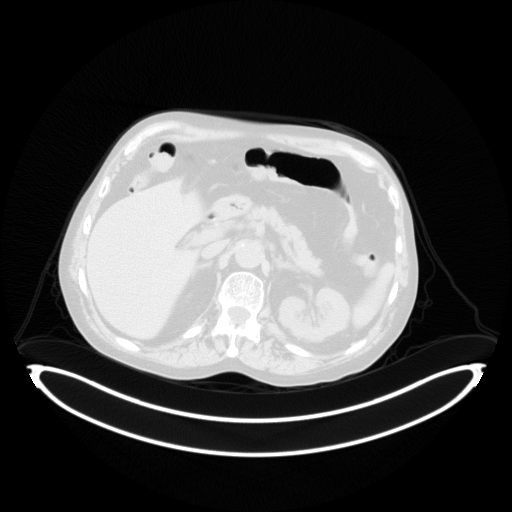
[im 166/221]
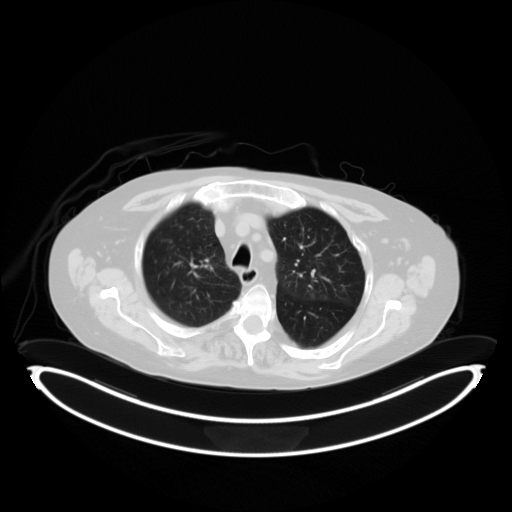
[im 221/221  brain]
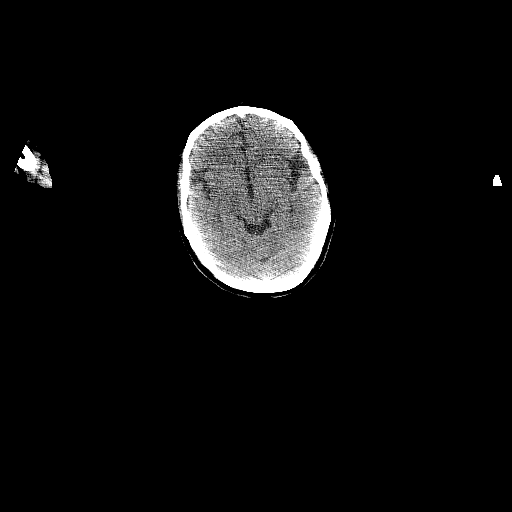

[Series 5: pet sk_thigh nac · axial · 5.0mm · 4.07mm/px · z∈[-1278,-398]mm · 5 of 221 slices shown]
[im 1/221]
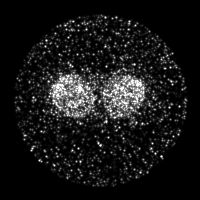
[im 56/221]
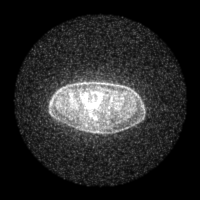
[im 111/221]
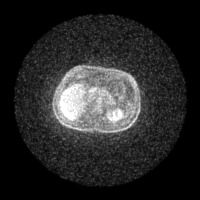
[im 166/221]
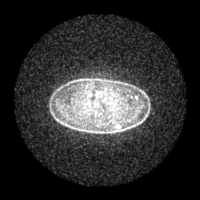
[im 221/221]
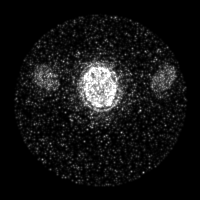

[Series 8: ct sk_thigh 5.0 b70f (id)_bone · axial · 5.0mm · 0.71mm/px · z∈[-824,-540]mm · 2 of 72 slices shown]
[im 1/72  bone]
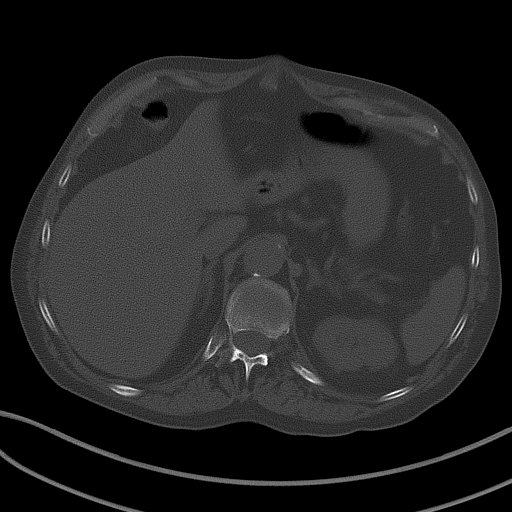
[im 72/72  bone]
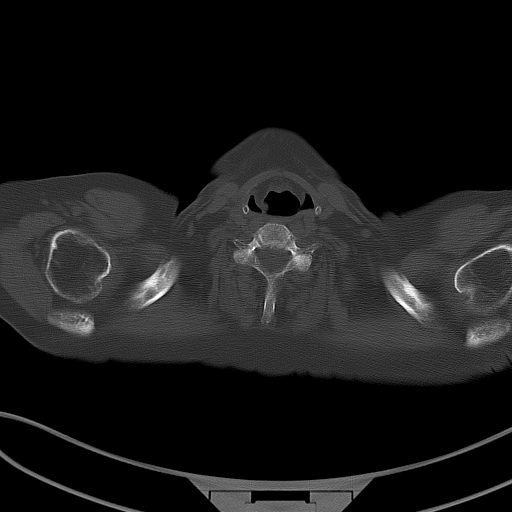

[Series 603: mip range · coronal · 1.83mm/px · 1 of 32 slices shown]
[im 1/32]
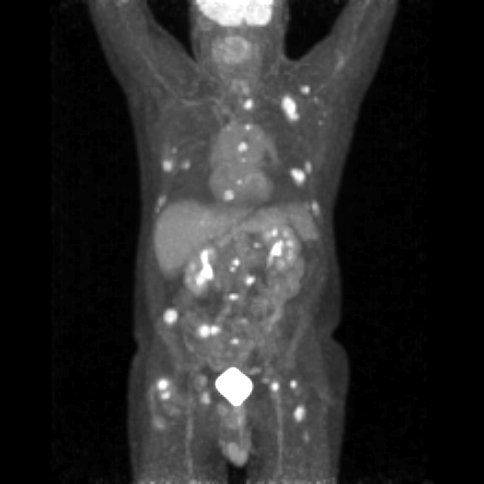

[Series 604: range-ct sk_thigh 5.0 (id)<alpha range> · 2 of 91 slices shown (1 of 2)]
[im 1/91]
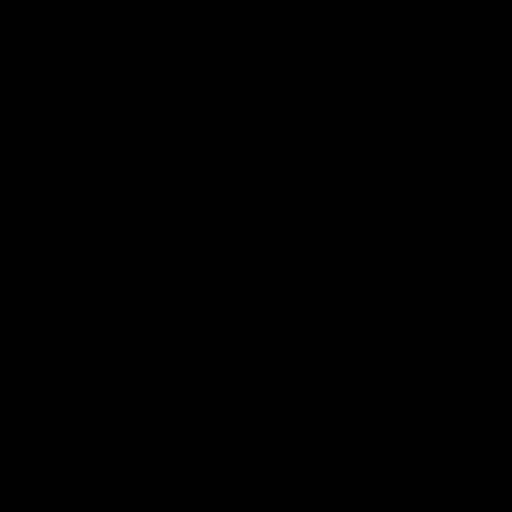
[im 91/91]
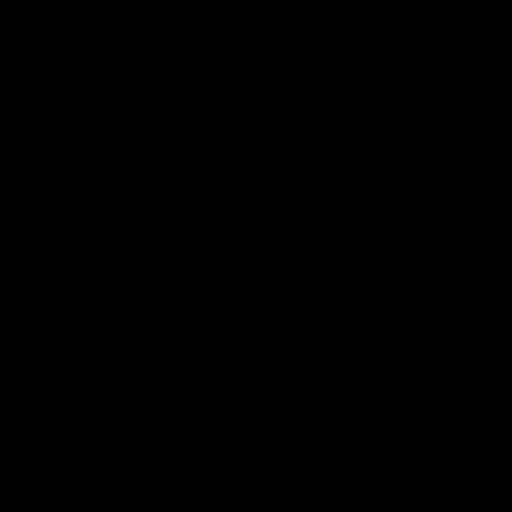

[Series 605: range-ct sk_thigh 5.0 (id)<alpha range> · 5 of 210 slices shown (2 of 2)]
[im 1/210]
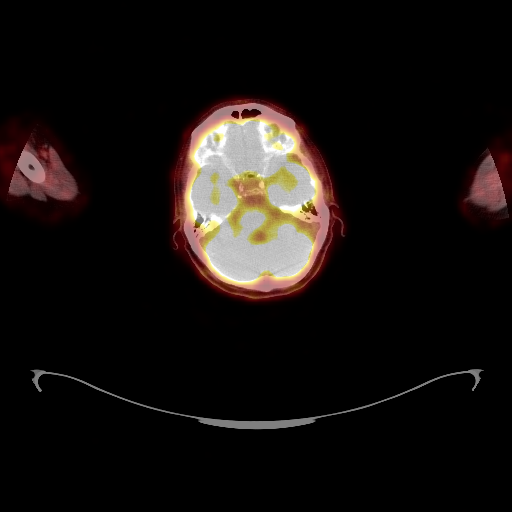
[im 53/210]
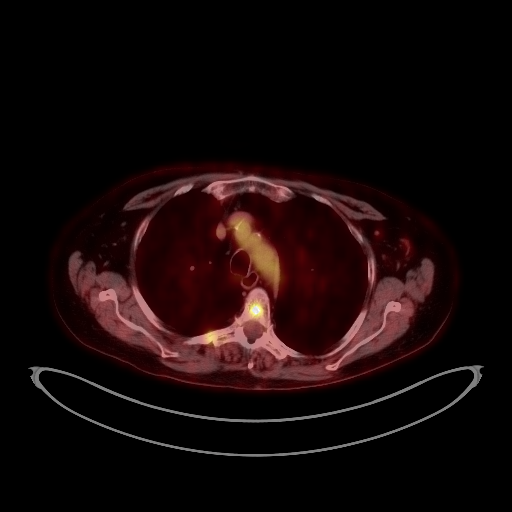
[im 105/210]
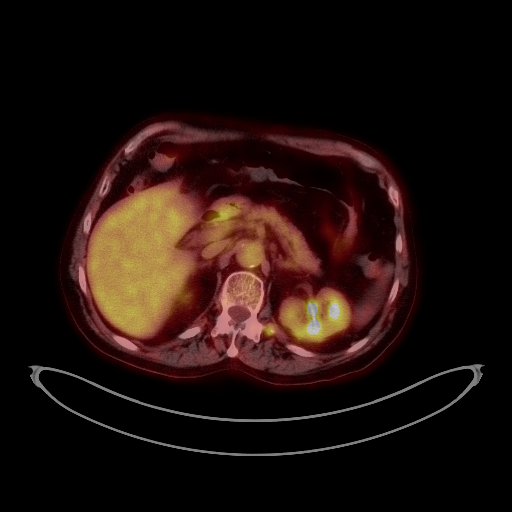
[im 157/210]
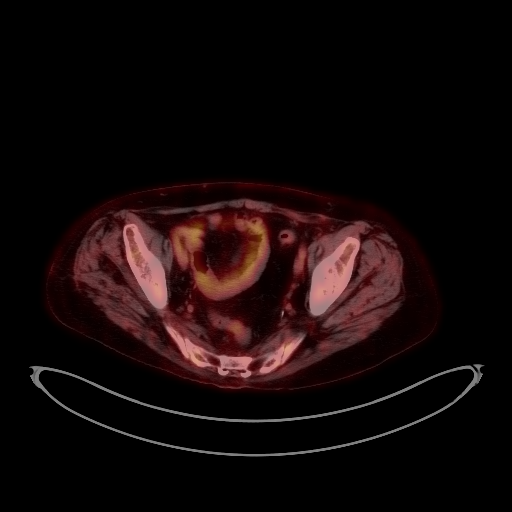
[im 210/210]
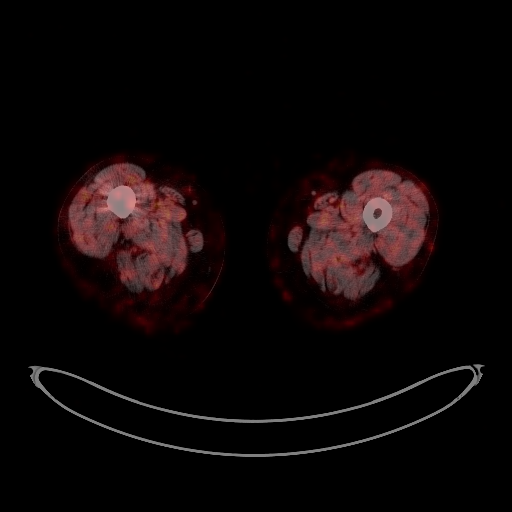

[Series 1032: results mm oncology reading · 0.89mm/px · 1 of 13 slices shown]
[im 1/13]
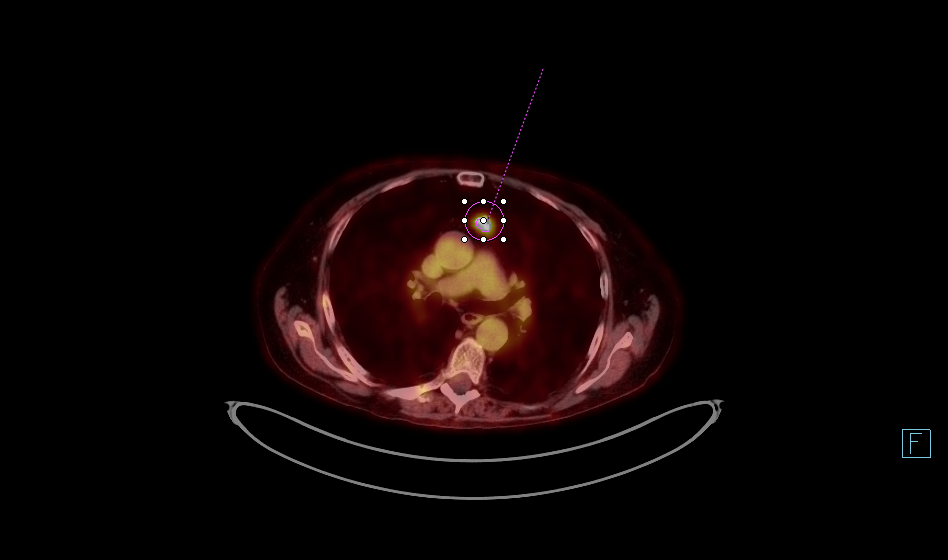

[25 of 25 positions shown; findings below may reference images not displayed]

FINDINGS: NECK: No hypermetabolic lymph nodes in the neck.

CHEST: Nonenlarged and weakly hypermetabolic left axillary lymph
nodes may be inflammatory and related to the left scapular lesion.

2 cm left upper lobe lung lesion adjacent to the pericardium on
image number 33 is hypermetabolic with SUV max of 9.6. This is
likely a primary lung neoplasm. No definite metastatic pulmonary
nodules. The centrilobular nodular appearance on the prior chest CT
has improved and was more likely due to respiratory bronchiolitis or
hypersensitivity pneumonitis.

Single hypermetabolic prevascular lymph node on image number 63
measures 6.5 mm and SUV max is 4.22.

ABDOMEN/PELVIS: No abnormal hypermetabolic activity within the
liver, pancreas, adrenal glands, or spleen. No hypermetabolic lymph
nodes in the abdomen or pelvis.

SKELETON: Diffuse osseous metastatic disease. This involves the
axial and appendicular skeleton. There are bilateral scapular
lesions, numerous thoracic and lumbar vertebral body lesions, rib
lesions, pelvic bone lesions and left hip lesions. Areas of cortical
destruction are noted notably involving the lesion in the left
scapular body and the right iliac bone. No spinal canal compromise.

Index lesion involving left scapula has an SUV max of 15.3. The
left-sided T3 lesion has an SUV max of 11.7. Right iliac bone lesion
has SUV max of
IMPRESSION: 1. Hypermetabolic left upper lobe lung lesion consistent with
primary lung neoplasm. There is also a metabolically active left
prevascular lymph node.
2. Diffuse lytic osseous metastatic disease.
3. No findings for pulmonary metastatic disease or abdominal/pelvic
metastatic disease.
